# Patient Record
Sex: Female | Born: 1970 | Hispanic: Refuse to answer | Marital: Married | Smoking: Never smoker
Health system: Southern US, Community
[De-identification: ages and names within clinical notes are randomized; demographics above are authoritative.]

## PROBLEM LIST (undated history)

## (undated) DIAGNOSIS — Z8041 Family history of malignant neoplasm of ovary: Secondary | ICD-10-CM

## (undated) DIAGNOSIS — C50919 Malignant neoplasm of unspecified site of unspecified female breast: Secondary | ICD-10-CM

## (undated) DIAGNOSIS — Z8049 Family history of malignant neoplasm of other genital organs: Secondary | ICD-10-CM

## (undated) DIAGNOSIS — M722 Plantar fascial fibromatosis: Secondary | ICD-10-CM

## (undated) DIAGNOSIS — I773 Arterial fibromuscular dysplasia: Secondary | ICD-10-CM

## (undated) DIAGNOSIS — Z8042 Family history of malignant neoplasm of prostate: Secondary | ICD-10-CM

## (undated) HISTORY — DX: Family history of malignant neoplasm of other genital organs: Z80.49

## (undated) HISTORY — DX: Family history of malignant neoplasm of ovary: Z80.41

## (undated) HISTORY — DX: Family history of malignant neoplasm of prostate: Z80.42

## (undated) HISTORY — DX: Malignant neoplasm of unspecified site of unspecified female breast: C50.919

## (undated) HISTORY — DX: Arterial fibromuscular dysplasia: I77.3

## (undated) HISTORY — DX: Plantar fascial fibromatosis: M72.2

---

## 1998-08-15 ENCOUNTER — Other Ambulatory Visit: Admission: RE | Admit: 1998-08-15 | Discharge: 1998-08-15 | Payer: Self-pay | Admitting: *Deleted

## 2003-11-27 ENCOUNTER — Other Ambulatory Visit: Admission: RE | Admit: 2003-11-27 | Discharge: 2003-11-27 | Payer: Self-pay | Admitting: Obstetrics and Gynecology

## 2004-11-09 ENCOUNTER — Emergency Department (HOSPITAL_COMMUNITY): Admission: EM | Admit: 2004-11-09 | Discharge: 2004-11-10 | Payer: Self-pay | Admitting: Emergency Medicine

## 2005-12-18 ENCOUNTER — Other Ambulatory Visit: Admission: RE | Admit: 2005-12-18 | Discharge: 2005-12-18 | Payer: Self-pay | Admitting: Obstetrics and Gynecology

## 2011-10-15 ENCOUNTER — Other Ambulatory Visit (HOSPITAL_COMMUNITY)
Admission: RE | Admit: 2011-10-15 | Discharge: 2011-10-15 | Disposition: A | Discharge status: Home / Self Care | Payer: BC Managed Care – PPO | Source: Ambulatory Visit | Attending: Internal Medicine | Admitting: Internal Medicine

## 2011-10-15 DIAGNOSIS — Z01419 Encounter for gynecological examination (general) (routine) without abnormal findings: Secondary | ICD-10-CM | POA: Insufficient documentation

## 2011-10-15 DIAGNOSIS — Z1159 Encounter for screening for other viral diseases: Secondary | ICD-10-CM | POA: Insufficient documentation

## 2012-06-14 DIAGNOSIS — Z0189 Encounter for other specified special examinations: Secondary | ICD-10-CM | POA: Insufficient documentation

## 2012-06-15 ENCOUNTER — Emergency Department (HOSPITAL_COMMUNITY)
Admission: EM | Admit: 2012-06-15 | Discharge: 2012-06-15 | Disposition: A | Discharge status: Home / Self Care | Payer: BC Managed Care – PPO | Attending: Emergency Medicine | Admitting: Emergency Medicine

## 2012-06-15 ENCOUNTER — Encounter (HOSPITAL_COMMUNITY): Payer: Self-pay | Admitting: *Deleted

## 2012-06-15 DIAGNOSIS — Z77098 Contact with and (suspected) exposure to other hazardous, chiefly nonmedicinal, chemicals: Secondary | ICD-10-CM
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
## 2013-08-08 ENCOUNTER — Encounter: Payer: Self-pay | Admitting: Internal Medicine

## 2013-08-09 ENCOUNTER — Encounter: Payer: Self-pay | Admitting: Physician Assistant

## 2013-08-09 ENCOUNTER — Ambulatory Visit: Payer: BC Managed Care – PPO | Admitting: Physician Assistant

## 2013-08-09 VITALS — BP 100/58 | HR 80 | Temp 98.8°F | Resp 16 | Wt 118.0 lb

## 2013-08-09 DIAGNOSIS — R197 Diarrhea, unspecified: Secondary | ICD-10-CM

## 2013-08-09 DIAGNOSIS — N3000 Acute cystitis without hematuria: Secondary | ICD-10-CM

## 2013-08-09 DIAGNOSIS — M26609 Unspecified temporomandibular joint disorder, unspecified side: Secondary | ICD-10-CM

## 2013-08-09 NOTE — Patient Instructions (Signed)
What is the TMJ? The temporomandibular (tem-PUH-ro-man-DIB-yoo-ler) joint, or the TMJ, connects the upper and lower jawbones. This joint allows the jaw to open wide and move back and forth when you chew, talk, or yawn.There are also several muscles that help this joint move. There can be muscle tightness and pain in the muscle that can cause several symptoms.  What causes TMJ pain? There are many causes of TMJ pain. Repeated chewing (for example, chewing gum) and clenching your teeth can cause pain in the joint. Some TMJ pain has no obvious cause. What can I do to ease the pain? There are many things you can do to help your pain get better. When you have pain:  Eat soft foods and stay away from chewy foods (for example, taffy) Try to use both sides of your mouth to chew Don't chew gum Don't open your mouth wide (for example, during yawning or singing) Don't bite your cheeks or fingernails Lower your amount of stress and worry Applying a warm, damp washcloth to the joint may help. Over-the-counter pain medicines such as ibuprofen (one brand: Advil) or acetaminophen (one brand: Tylenol) might also help. Do not use these medicines if you are allergic to them or if your doctor told you not to use them. How can I stop the pain from coming back? When your pain is better, you can do these exercises to make your muscles stronger and to keep the pain from coming back:  Resisted mouth opening: Place your thumb or two fingers under your chin and open your mouth slowly, pushing up lightly on your chin with your thumb. Hold for three to six seconds. Close your mouth slowly. Resisted mouth closing: Place your thumbs under your chin and your two index fingers on the ridge between your mouth and the bottom of your chin. Push down lightly on your chin as you close your mouth. Tongue up: Slowly open and close your mouth while keeping the tongue touching the roof of the mouth. Side-to-side jaw movement: Place an  object about one fourth of an inch thick (for example, two tongue depressors) between your front teeth. Slowly move your jaw from side to side. Increase the thickness of the object as the exercise becomes easier Forward jaw movement: Place an object about one fourth of an inch thick between your front teeth and move the bottom jaw forward so that the bottom teeth are in front of the top teeth. Increase the thickness of the object as the exercise becomes easier. These exercises should not be painful. If it hurts to do these exercises, stop doing them and talk to your family doctor.  Diet for Diarrhea, Adult Frequent, runny stools (diarrhea) may be caused or worsened by food or drink. Diarrhea may be relieved by changing your diet. Since diarrhea can last up to 7 days, it is easy for you to lose too much fluid from the body and become dehydrated. Fluids that are lost need to be replaced. Along with a modified diet, make sure you drink enough fluids to keep your urine clear or pale yellow. DIET INSTRUCTIONS  Ensure adequate fluid intake (hydration): have 1 cup (8 oz) of fluid for each diarrhea episode. Avoid fluids that contain simple sugars or sports drinks, fruit juices, whole milk products, and sodas. Your urine should be clear or pale yellow if you are drinking enough fluids. Hydrate with an oral rehydration solution that you can purchase at pharmacies, retail stores, and online. You can prepare an oral rehydration  solution at home by mixing the following ingredients together:    tsp table salt.   tsp baking soda.   tsp salt substitute containing potassium chloride.  1  tablespoons sugar.  1 L (34 oz) of water.  Certain foods and beverages may increase the speed at which food moves through the gastrointestinal (GI) tract. These foods and beverages should be avoided and include:  Caffeinated and alcoholic beverages.  High-fiber foods, such as raw fruits and vegetables, nuts, seeds, and whole  grain breads and cereals.  Foods and beverages sweetened with sugar alcohols, such as xylitol, sorbitol, and mannitol.  Some foods may be well tolerated and may help thicken stool including:  Starchy foods, such as rice, toast, pasta, low-sugar cereal, oatmeal, grits, baked potatoes, crackers, and bagels.   Bananas.   Applesauce.  Add probiotic-rich foods to help increase healthy bacteria in the GI tract, such as yogurt and fermented milk products. RECOMMENDED FOODS AND BEVERAGES Starches Choose foods with less than 2 g of fiber per serving.  Recommended:  White, [place], and pita breads, plain rolls, buns, bagels. Plain muffins, matzo. Soda, saltine, or graham crackers. Pretzels, melba toast, zwieback. Cooked cereals made with water: cornmeal, farina, cream cereals. Dry cereals: refined corn, wheat, rice. Potatoes prepared any way without skins, refined macaroni, spaghetti, noodles, refined rice.  Avoid:  Bread, rolls, or crackers made with whole wheat, multi-grains, rye, bran seeds, nuts, or coconut. Corn tortillas or taco shells. Cereals containing whole grains, multi-grains, bran, coconut, nuts, raisins. Cooked or dry oatmeal. Coarse wheat cereals, granola. Cereals advertised as "high-fiber." Potato skins. Whole grain pasta, wild or brown rice. Popcorn. Sweet potatoes, yams. Sweet rolls, doughnuts, waffles, pancakes, sweet breads. Vegetables  Recommended: Strained tomato and vegetable juices. Most well-cooked and canned vegetables without seeds. Fresh: Tender lettuce, cucumber without the skin, cabbage, spinach, bean sprouts.  Avoid: Fresh, cooked, or canned: Artichokes, baked beans, beet greens, broccoli, Brussels sprouts, corn, kale, legumes, peas, sweet potatoes. Cooked: Green or red cabbage, spinach. Avoid large servings of any vegetables because vegetables shrink when cooked, and they contain more fiber per serving than fresh vegetables. Fruit  Recommended: Cooked or canned:  Apricots, applesauce, cantaloupe, cherries, fruit cocktail, grapefruit, grapes, kiwi, mandarin oranges, peaches, pears, plums, watermelon. Fresh: Apples without skin, ripe banana, grapes, cantaloupe, cherries, grapefruit, peaches, oranges, plums. Keep servings limited to  cup or 1 piece.  Avoid: Fresh: Apples with skin, apricots, mangoes, pears, raspberries, strawberries. Prune juice, stewed or dried prunes. Dried fruits, raisins, dates. Large servings of all fresh fruits. Protein  Recommended: Ground or well-cooked tender beef, ham, veal, lamb, pork, or poultry. Eggs. Fish, oysters, shrimp, lobster, other seafoods. Liver, organ meats.  Avoid: Tough, fibrous meats with gristle. Peanut butter, smooth or chunky. Cheese, nuts, seeds, legumes, dried peas, beans, lentils. Dairy  Recommended: Yogurt, lactose-free milk, kefir, drinkable yogurt, buttermilk, soy milk, or plain hard cheese.  Avoid: Milk, chocolate milk, beverages made with milk, such as milkshakes. Soups  Recommended: Bouillon, broth, or soups made from allowed foods. Any strained soup.  Avoid: Soups made from vegetables that are not allowed, cream or milk-based soups. Desserts and Sweets  Recommended: Sugar-free gelatin, sugar-free frozen ice pops made without sugar alcohol.  Avoid: Plain cakes and cookies, pie made with fruit, pudding, custard, cream pie. Gelatin, fruit, ice, sherbet, frozen ice pops. Ice cream, ice milk without nuts. Plain hard candy, honey, jelly, molasses, syrup, sugar, chocolate syrup, gumdrops, marshmallows. Fats and Oils  Recommended: Limit fats to less than 8  tsp per day.  Avoid: Seeds, nuts, olives, avocados. Margarine, butter, cream, mayonnaise, salad oils, plain salad dressings. Plain gravy, crisp bacon without rind. Beverages  Recommended: Water, decaffeinated teas, oral rehydration solutions, sugar-free beverages not sweetened with sugar alcohols.  Avoid: Fruit juices, caffeinated beverages  (coffee, tea, soda), alcohol, sports drinks, or lemon-lime soda. Condiments  Recommended: Ketchup, mustard, horseradish, vinegar, cocoa powder. Spices in moderation: allspice, basil, bay leaves, celery powder or leaves, cinnamon, cumin powder, curry powder, ginger, mace, marjoram, onion or garlic powder, oregano, paprika, parsley flakes, ground pepper, rosemary, sage, savory, tarragon, thyme, turmeric.  Avoid: Coconut, honey. Document Released: 12/05/2003 Document Revised: 06/08/2012 Document Reviewed: 01/29/2012 [facility] Patient Information 2014 [place], [place].

## 2013-08-09 NOTE — Progress Notes (Signed)
Subjective:     Patient ID: Hannah Woodard, female   DOB: 03-Jul-1971, 42 y.o.   MRN: [id number]  Diarrhea  This is a new problem. The current episode started 1 to 4 weeks ago. The problem occurs less than 2 times per day. The problem has been unchanged. The patient states that diarrhea does not awaken her from sleep. Associated symptoms include abdominal pain (right lower flank/quadrant), coughing, headaches and a URI. Pertinent negatives include no chills (jaw pain), fever or vomiting. The symptoms are aggravated by diary products. There are no known risk factors. She has tried bismuth subsalicylate for the symptoms. The treatment provided no relief.  Rectal Bleeding  The current episode started yesterday. The problem occurs rarely. The problem has been unchanged. The stool is described as soft. Associated symptoms include abdominal pain (right lower flank/quadrant), diarrhea, nausea, headaches and coughing. Pertinent negatives include no fever, no rectal pain, no vomiting and no chest pain.  Cough This is a new problem. The current episode started yesterday. The problem has been unchanged. The cough is non-productive. Associated symptoms include ear pain and headaches. Pertinent negatives include no chest pain, chills (jaw pain), ear congestion, fever, postnasal drip, rhinorrhea, shortness of breath or wheezing. Nothing aggravates the symptoms. She has tried nothing for the symptoms.  Patient also mentions possible UTI symptoms of dysuria and right flank/back pain.    Medication List       This list is accurate as of: 08/09/13  4:44 PM.  Always use your most recent med list.               b complex vitamins tablet  Take 1 tablet by mouth daily.     fish oil-omega-3 fatty acids 1000 MG capsule  Take 1 g by mouth daily.     Green Tea 250 MG Caps  Take by mouth 2 (two) times daily.     PROBIOTIC DAILY PO  Take 1 capsule by mouth daily.     vitamin C 500 MG tablet  Commonly known as:   ASCORBIC ACID  Take 1,000 mg by mouth daily.     Vitamin D 2000 UNITS tablet  Take 10,000 Units by mouth daily.     vitamin E 400 UNIT capsule  Take 400 Units by mouth daily.      =  Review of Systems  Constitutional: Negative.  Negative for fever and chills (jaw pain).  HENT: Positive for ear pain. Negative for congestion, dental problem, ear discharge, facial swelling, hearing loss, mouth sores, nosebleeds, postnasal drip, rhinorrhea and sinus pressure.   Respiratory: Positive for cough and choking. Negative for chest tightness, shortness of breath, wheezing and stridor.   Cardiovascular: Negative.  Negative for chest pain.  Gastrointestinal: Positive for nausea, abdominal pain (right lower flank/quadrant), diarrhea and hematochezia. Negative for vomiting, constipation, abdominal distention, anal bleeding and rectal pain. Blood in stool: Black stool X 1-2 times.  Genitourinary: Positive for dysuria and flank pain.  Musculoskeletal: Negative.   Neurological: Positive for headaches.  Psychiatric/Behavioral: Negative.        Objective:   Physical Exam  Constitutional: She appears well-developed and well-nourished.  HENT:  Head: Normocephalic and atraumatic.  Right Ear: External ear normal.  Left Ear: External ear normal.  + Bilateral TMJ tenderness  Abdominal: Soft. Bowel sounds are normal. There is tenderness in the right lower quadrant and suprapubic area. There is no rebound.       Assessment and Plan:     TMJ pain-  Heat, massage, soft foods.  Diarrhea with questionable melana- get labs, send hemoccult card. Test celiac, test for ESR, CBC, BMP, magnesium.   Questionable UTI- check UA and C&S.      Follow up PRN.

## 2013-08-10 ENCOUNTER — Encounter: Payer: Self-pay | Admitting: Physician Assistant

## 2013-08-10 LAB — CBC WITH DIFFERENTIAL/PLATELET
Basophils Absolute: 0 10*3/uL (ref 0.0–0.1)
Basophils Relative: 1 % (ref 0–1)
HCT: 40.3 % (ref 36.0–46.0)
Lymphocytes Relative: 23 % (ref 12–46)
Lymphs Abs: 1.4 10*3/uL (ref 0.7–4.0)
MCH: 33.4 pg (ref 26.0–34.0)
Monocytes Absolute: 0.5 10*3/uL (ref 0.1–1.0)
Monocytes Relative: 8 % (ref 3–12)

## 2013-08-10 LAB — URINALYSIS, ROUTINE W REFLEX MICROSCOPIC
Glucose, UA: NEGATIVE mg/dL
Ketones, ur: NEGATIVE mg/dL
Leukocytes, UA: NEGATIVE
Nitrite: NEGATIVE
Urobilinogen, UA: 0.2 mg/dL (ref 0.0–1.0)

## 2013-08-10 LAB — SEDIMENTATION RATE: Sed Rate: 1 mm/hr (ref 0–22)

## 2013-08-10 LAB — HEPATIC FUNCTION PANEL
AST: 20 U/L (ref 0–37)
Albumin: 4.8 g/dL (ref 3.5–5.2)
Total Bilirubin: 0.8 mg/dL (ref 0.3–1.2)
Total Protein: 7.3 g/dL (ref 6.0–8.3)

## 2013-08-10 LAB — BASIC METABOLIC PANEL WITH GFR
CO2: 29 mEq/L (ref 19–32)
Glucose, Bld: 88 mg/dL (ref 70–99)
Potassium: 3.9 mEq/L (ref 3.5–5.3)

## 2013-08-10 LAB — MAGNESIUM: Magnesium: 2.3 mg/dL (ref 1.5–2.5)

## 2013-08-11 LAB — CELIAC PANEL 10
Endomysial Screen: NEGATIVE
Gliadin IgA: 5.3 U/mL (ref <20)
IgA: 100 mg/dL (ref 69–380)

## 2013-10-03 ENCOUNTER — Other Ambulatory Visit: Payer: Self-pay | Admitting: Internal Medicine

## 2013-10-03 DIAGNOSIS — R197 Diarrhea, unspecified: Secondary | ICD-10-CM

## 2013-10-03 LAB — POC HEMOCCULT BLD/STL (HOME/3-CARD/SCREEN)
FECAL OCCULT BLD: NEGATIVE
FECAL OCCULT BLD: NEGATIVE
Fecal Occult Blood, POC: NEGATIVE

## 2013-11-22 ENCOUNTER — Encounter: Payer: Self-pay | Admitting: Physician Assistant

## 2013-12-01 ENCOUNTER — Encounter: Payer: Self-pay | Admitting: Physician Assistant

## 2013-12-04 ENCOUNTER — Ambulatory Visit (INDEPENDENT_AMBULATORY_CARE_PROVIDER_SITE_OTHER): Payer: BC Managed Care – PPO | Admitting: Physician Assistant

## 2013-12-04 ENCOUNTER — Encounter: Payer: Self-pay | Admitting: Physician Assistant

## 2013-12-04 VITALS — BP 100/60 | HR 80 | Temp 97.9°F | Resp 16 | Ht 65.5 in | Wt 119.0 lb

## 2013-12-04 DIAGNOSIS — E559 Vitamin D deficiency, unspecified: Secondary | ICD-10-CM

## 2013-12-04 DIAGNOSIS — Z79899 Other long term (current) drug therapy: Secondary | ICD-10-CM

## 2013-12-04 DIAGNOSIS — Z0000 Encounter for general adult medical examination without abnormal findings: Secondary | ICD-10-CM

## 2013-12-04 DIAGNOSIS — N3000 Acute cystitis without hematuria: Secondary | ICD-10-CM

## 2013-12-04 LAB — CBC WITH DIFFERENTIAL/PLATELET
BASOS PCT: 0 % (ref 0–1)
Basophils Absolute: 0 10*3/uL (ref 0.0–0.1)
EOS ABS: 0.1 10*3/uL (ref 0.0–0.7)
Eosinophils Relative: 1 % (ref 0–5)
HCT: 38.6 % (ref 36.0–46.0)
HEMOGLOBIN: 13.1 g/dL (ref 12.0–15.0)
LYMPHS PCT: 23 % (ref 12–46)
Lymphs Abs: 1.3 10*3/uL (ref 0.7–4.0)
MCH: 33 pg (ref 26.0–34.0)
MCHC: 33.9 g/dL (ref 30.0–36.0)
MCV: 97.2 fL (ref 78.0–100.0)
Monocytes Absolute: 0.4 10*3/uL (ref 0.1–1.0)
Monocytes Relative: 7 % (ref 3–12)
Neutro Abs: 3.8 10*3/uL (ref 1.7–7.7)
Neutrophils Relative %: 69 % (ref 43–77)
PLATELETS: 251 10*3/uL (ref 150–400)
RBC: 3.97 MIL/uL (ref 3.87–5.11)
RDW: 13.1 % (ref 11.5–15.5)
WBC: 5.5 10*3/uL (ref 4.0–10.5)

## 2013-12-04 LAB — HEPATIC FUNCTION PANEL
ALT: 12 U/L (ref 0–35)
AST: 17 U/L (ref 0–37)
Albumin: 4.6 g/dL (ref 3.5–5.2)
Alkaline Phosphatase: 41 U/L (ref 39–117)
Bilirubin, Direct: 0.2 mg/dL (ref 0.0–0.3)
Indirect Bilirubin: 0.6 mg/dL (ref 0.2–1.2)
Total Bilirubin: 0.8 mg/dL (ref 0.2–1.2)
Total Protein: 6.7 g/dL (ref 6.0–8.3)

## 2013-12-04 LAB — LIPID PANEL
CHOL/HDL RATIO: 2.2 ratio
Cholesterol: 123 mg/dL (ref 0–200)
HDL: 55 mg/dL (ref >39)
LDL CALC: 60 mg/dL (ref 0–99)
TRIGLYCERIDES: 38 mg/dL (ref <150)
VLDL: 8 mg/dL (ref 0–40)

## 2013-12-04 LAB — BASIC METABOLIC PANEL WITH GFR
BUN: 10 mg/dL (ref 6–23)
CALCIUM: 9.1 mg/dL (ref 8.4–10.5)
CO2: 29 mEq/L (ref 19–32)
CREATININE: 0.48 mg/dL — AB (ref 0.50–1.10)
Chloride: 100 mEq/L (ref 96–112)
Glucose, Bld: 88 mg/dL (ref 70–99)
Potassium: 4.5 mEq/L (ref 3.5–5.3)
Sodium: 138 mEq/L (ref 135–145)

## 2013-12-04 LAB — IRON AND TIBC
%SAT: 39 % (ref 20–55)
Iron: 128 ug/dL (ref 42–145)
TIBC: 332 ug/dL (ref 250–470)
UIBC: 204 ug/dL (ref 125–400)

## 2013-12-04 LAB — HEMOGLOBIN A1C
HEMOGLOBIN A1C: 5.1 % (ref <5.7)
Mean Plasma Glucose: 100 mg/dL (ref <117)

## 2013-12-04 LAB — MAGNESIUM: MAGNESIUM: 2 mg/dL (ref 1.5–2.5)

## 2013-12-04 NOTE — Patient Instructions (Addendum)
Try to get alegria shoes for standing.   What is the TMJ? The temporomandibular (tem-PUH-ro-man-DIB-yoo-ler) joint, or the TMJ, connects the upper and lower jawbones. This joint allows the jaw to open wide and move back and forth when you chew, talk, or yawn.There are also several muscles that help this joint move. There can be muscle tightness and pain in the muscle that can cause several symptoms.  What causes TMJ pain? There are many causes of TMJ pain. Repeated chewing (for example, chewing gum) and clenching your teeth can cause pain in the joint. Some TMJ pain has no obvious cause. What can I do to ease the pain? There are many things you can do to help your pain get better. When you have pain:  Eat soft foods and stay away from chewy foods (for example, taffy) Try to use both sides of your mouth to chew Don't chew gum Don't open your mouth wide (for example, during yawning or singing) Don't bite your cheeks or fingernails Lower your amount of stress and worry Applying a warm, damp washcloth to the joint may help. Over-the-counter pain medicines such as ibuprofen (one brand: Advil) or acetaminophen (one brand: Tylenol) might also help. Do not use these medicines if you are allergic to them or if your doctor told you not to use them. How can I stop the pain from coming back? When your pain is better, you can do these exercises to make your muscles stronger and to keep the pain from coming back:  Resisted mouth opening: Place your thumb or two fingers under your chin and open your mouth slowly, pushing up lightly on your chin with your thumb. Hold for three to six seconds. Close your mouth slowly. Resisted mouth closing: Place your thumbs under your chin and your two index fingers on the ridge between your mouth and the bottom of your chin. Push down lightly on your chin as you close your mouth. Tongue up: Slowly open and close your mouth while keeping the tongue touching the roof of the  mouth. Side-to-side jaw movement: Place an object about one fourth of an inch thick (for example, two tongue depressors) between your front teeth. Slowly move your jaw from side to side. Increase the thickness of the object as the exercise becomes easier Forward jaw movement: Place an object about one fourth of an inch thick between your front teeth and move the bottom jaw forward so that the bottom teeth are in front of the top teeth. Increase the thickness of the object as the exercise becomes easier. These exercises should not be painful. If it hurts to do these exercises, stop doing them and talk to your family doctor.

## 2013-12-04 NOTE — Progress Notes (Signed)
Complete Physical HPI 43 y.o. female  presents for a complete physical. Her blood pressure has been controlled at home, today their BP is BP: 100/60 mmHg Patient is on Vitamin D supplement.   She continues to have some mild diarrhea but has linked it to fried foods, coconut oil, vitamin C For the last two days she has had urgency, frequency, some discomfort.  LMP- 11/24/23 Right foot cramping for past 1-2 months at the end of the day,   Current Medications:  Current Outpatient Prescriptions on File Prior to Visit  Medication Sig Dispense Refill  . b complex vitamins tablet Take 1 tablet by mouth daily.      . Cholecalciferol (VITAMIN D) 2000 UNITS tablet Take 10,000 Units by mouth daily.       . fish oil-omega-3 fatty acids 1000 MG capsule Take 1 g by mouth daily.      Nyoka Cowden Tea 250 MG CAPS Take by mouth 2 (two) times daily.      . Probiotic Product (PROBIOTIC DAILY PO) Take 1 capsule by mouth daily.      . vitamin C (ASCORBIC ACID) 500 MG tablet Take 1,000 mg by mouth daily.      . vitamin E 400 UNIT capsule Take 400 Units by mouth daily.       No current facility-administered medications on file prior to visit.   Health Maintenance:   There is no immunization history on file for this patient.  Tetanus: 2010 Pneumovax: N/A Flu vaccine: N/A Zostavax: N/A Pap:2013 Normal never had abnormal pap MGM: 12/2012 with neg biopsy DEXA: N/A Colonoscopy: N/A EGD: N/A  Allergies:  Allergies  Allergen Reactions  . Poison Ivy Extract [employer] Poison Ivy]    Medical History: No past medical history on file. Surgical History: No past surgical history on file. Family History:  Family History  Problem Relation Age of Onset  . Cancer Mother     ovarian  . Cancer Father     prosate   Social History:  History  Substance Use Topics  . Smoking status: Never Smoker   . Smokeless tobacco: Not on file  . Alcohol Use: No    Review of Systems: [X]  = complains of  [ ]  =  denies  General: Fatigue [ ]  Fever [ ]  Chills [ ]  Weakness [ ]   Insomnia [ ]  Weight change [ ]  Night sweats [ ]   Change in appetite [ ]  Eyes: Glasses DEE 2010 Redness [ ]  Blurred vision [ ]  Diplopia [ ]  Discharge [ ]   ENT: Congestion [ ]  Sinus Pain [ ]  Post Nasal Drip [ ]  Sore Throat [ ]  Earache [ ]  hearing loss [ ]  Tinnitus [ ]  Snoring [ ]   Cardiac: Chest pain/pressure [ ]  SOB [ ]  Orthopnea [ ]   Palpitations [ ]   Paroxysmal nocturnal dyspnea[ ]  Claudication [ ]  Edema [ ]   Pulmonary: Cough [ ]  Wheezing[ ]   SOB [ ]   Pleurisy [ ]   GI: Nausea [ ]  Vomiting[ ]  Dysphagia[ ]  Heartburn[X ] Abdominal pain [ ]  Constipation Valu.Nieves ]; Diarrhea Valu.Nieves ] BRBPR [ ]  Melena[ ]  Bloating [ ]  Hemorrhoids [ ]   GU: Hematuria[ ]  Dysuria [ ]  Nocturia[ ]  Urgency [ ]   Hesitancy [ ]  Discharge [ ]  Frequency [ ]   Breast:  Breast lumps [ ]   nipple discharge [ ]    Neuro: Headaches[ ]  Vertigo[ ]  Paresthesias[ ]  Spasm [ ]  Speech changes [ ]  Incoordination [ ]   Ortho: Arthritis [ ]  Joint  pain [ ]  Muscle pain [ ]  Joint swelling [ ]  Back Pain [ ]  Skin:  Rash [ ]   Pruritis [ ]  Change in skin lesion [ ]   Psych: Depression[ ]  Anxiety[ ]  Confusion [ ]  Memory loss [ ]   Heme/Lypmh: Bleeding [ ]  Bruising [ ]  Enlarged lymph nodes [ ]   Endocrine: Visual blurring [ ]  Paresthesia [ ]  Polyuria [ ]  Polydypsea [ ]    Heat/cold intolerance [ ]  Hypoglycemia [ ]   Physical Exam: Estimated body mass index is 19.49 kg/(m^2) as calculated from the following:   Height as of this encounter: 5' 5.5" (1.664 m).   Weight as of this encounter: 119 lb (53.978 kg). Filed Vitals:   12/04/13 1407  BP: 100/60  Pulse: 80  Temp: 97.9 F (36.6 C)  Resp: 16   General Appearance: Well nourished, in no apparent distress. Eyes: PERRLA, EOMs, conjunctiva no swelling or erythema, normal fundi and vessels. Sinuses: No Frontal/maxillary tenderness ENT/Mouth: Ext aud canals clear, normal light reflex with TMs without erythema, bulging.  Good dentition. No erythema,  swelling, or exudate on post pharynx. Tonsils not swollen or erythematous. Hearing normal.  Neck: Supple, thyroid normal. No bruits Respiratory: Respiratory effort normal, BS equal bilaterally without rales, rhonchi, wheezing or stridor. Cardio: RRR without murmurs, rubs or gallops. Brisk peripheral pulses without edema.  Chest: symmetric, with normal excursions and percussion. Breasts: Symmetric, without lumps, nipple discharge, retractions. Abdomen: Soft, +BS. Non tender, no guarding, rebound, hernias, masses, or organomegaly. .  Lymphatics: Non tender without lymphadenopathy.  Genitourinary: defer Musculoskeletal: Full ROM all peripheral extremities,5/5 strength, and normal gait. Skin: Warm, dry without rashes, lesions, ecchymosis.  Neuro: Cranial nerves intact, reflexes equal bilaterally. Normal muscle tone, no cerebellar symptoms. Sensation intact.  Psych: Awake and oriented X 3, normal affect, Insight and Judgment appropriate.   EKG: not needed  Assessment and Plan: Diarrhea- continue to monitor food intake/keep food diary.  Urgency/frequency- rule out UTI, versus ICS.  TMJ-information given to the patient, no gum/decrease hard foods, warm wet wash clothes, decrease stress, talk with dentist about possible night guard, can do massage, and exercise.  Get [facility] Health Maintenance  Discussed med's effects and SE's. Screening labs and tests as requested with regular follow-up as recommended.   Vicie Mutters 2:18 PM

## 2013-12-05 LAB — MICROALBUMIN / CREATININE URINE RATIO
CREATININE, URINE: 25.5 mg/dL
MICROALB UR: 0.54 mg/dL (ref 0.00–1.89)
MICROALB/CREAT RATIO: 21.2 mg/g (ref 0.0–30.0)

## 2013-12-05 LAB — URINALYSIS, ROUTINE W REFLEX MICROSCOPIC
Bilirubin Urine: NEGATIVE
Glucose, UA: NEGATIVE mg/dL
HGB URINE DIPSTICK: NEGATIVE
Ketones, ur: NEGATIVE mg/dL
LEUKOCYTES UA: NEGATIVE
Nitrite: NEGATIVE
PROTEIN: NEGATIVE mg/dL
Specific Gravity, Urine: <1.005 — ABNORMAL LOW (ref 1.005–1.030)
UROBILINOGEN UA: 0.2 mg/dL (ref 0.0–1.0)
pH: 6.5 (ref 5.0–8.0)

## 2013-12-05 LAB — INSULIN, FASTING: Insulin fasting, serum: 8 u[IU]/mL (ref 3–28)

## 2013-12-05 LAB — VITAMIN D 25 HYDROXY (VIT D DEFICIENCY, FRACTURES): Vit D, 25-Hydroxy: 71 ng/mL (ref 30–89)

## 2013-12-05 LAB — URINE CULTURE
COLONY COUNT: NO GROWTH
ORGANISM ID, BACTERIA: NO GROWTH

## 2013-12-05 LAB — VITAMIN B12: Vitamin B-12: 593 pg/mL (ref 211–911)

## 2013-12-05 LAB — FERRITIN: Ferritin: 27 ng/mL (ref 10–291)

## 2013-12-05 LAB — FOLATE RBC: RBC Folate: 642 ng/mL (ref >280)

## 2013-12-05 LAB — TSH: TSH: 2.136 u[IU]/mL (ref 0.350–4.500)

## 2014-02-12 ENCOUNTER — Encounter: Payer: Self-pay | Admitting: Physician Assistant

## 2014-07-10 ENCOUNTER — Emergency Department (HOSPITAL_COMMUNITY)
Admission: EM | Admit: 2014-07-10 | Discharge: 2014-07-10 | Disposition: A | Discharge status: Home / Self Care | Payer: BC Managed Care – PPO | Attending: Emergency Medicine | Admitting: Emergency Medicine

## 2014-07-10 ENCOUNTER — Encounter (HOSPITAL_COMMUNITY): Payer: Self-pay | Admitting: Emergency Medicine

## 2014-07-10 DIAGNOSIS — R197 Diarrhea, unspecified: Secondary | ICD-10-CM | POA: Diagnosis not present

## 2014-07-10 DIAGNOSIS — R5383 Other fatigue: Secondary | ICD-10-CM | POA: Diagnosis not present

## 2014-07-10 DIAGNOSIS — R1110 Vomiting, unspecified: Secondary | ICD-10-CM | POA: Diagnosis not present

## 2014-07-10 DIAGNOSIS — Z79899 Other long term (current) drug therapy: Secondary | ICD-10-CM | POA: Diagnosis not present

## 2014-07-10 LAB — COMPREHENSIVE METABOLIC PANEL
ALT: 14 U/L (ref 0–35)
ANION GAP: 13 (ref 5–15)
AST: 23 U/L (ref 0–37)
Albumin: 4.3 g/dL (ref 3.5–5.2)
Alkaline Phosphatase: 46 U/L (ref 39–117)
BUN: 9 mg/dL (ref 6–23)
CALCIUM: 9 mg/dL (ref 8.4–10.5)
CO2: 28 mEq/L (ref 19–32)
Chloride: 101 mEq/L (ref 96–112)
Creatinine, Ser: 0.46 mg/dL — ABNORMAL LOW (ref 0.50–1.10)
GFR calc Af Amer: >90 mL/min (ref >90)
GLUCOSE: 136 mg/dL — AB (ref 70–99)
POTASSIUM: 3.8 meq/L (ref 3.7–5.3)
Sodium: 142 mEq/L (ref 137–147)
TOTAL PROTEIN: 7 g/dL (ref 6.0–8.3)
Total Bilirubin: 0.7 mg/dL (ref 0.3–1.2)

## 2014-07-10 LAB — URINALYSIS, ROUTINE W REFLEX MICROSCOPIC
BILIRUBIN URINE: NEGATIVE
GLUCOSE, UA: NEGATIVE mg/dL
HGB URINE DIPSTICK: NEGATIVE
KETONES UR: 15 mg/dL — AB
Leukocytes, UA: NEGATIVE
Nitrite: NEGATIVE
PH: 7 (ref 5.0–8.0)
Protein, ur: NEGATIVE mg/dL
SPECIFIC GRAVITY, URINE: 1.011 (ref 1.005–1.030)
Urobilinogen, UA: 1 mg/dL (ref 0.0–1.0)

## 2014-07-10 LAB — CBC WITH DIFFERENTIAL/PLATELET
Basophils Absolute: 0 10*3/uL (ref 0.0–0.1)
Basophils Relative: 0 % (ref 0–1)
Eosinophils Absolute: 0 10*3/uL (ref 0.0–0.7)
Eosinophils Relative: 0 % (ref 0–5)
HEMATOCRIT: 37.1 % (ref 36.0–46.0)
Hemoglobin: 13 g/dL (ref 12.0–15.0)
LYMPHS PCT: 7 % — AB (ref 12–46)
Lymphs Abs: 0.7 10*3/uL (ref 0.7–4.0)
MCH: 34 pg (ref 26.0–34.0)
MCHC: 35 g/dL (ref 30.0–36.0)
MCV: 97.1 fL (ref 78.0–100.0)
MONO ABS: 0.5 10*3/uL (ref 0.1–1.0)
MONOS PCT: 5 % (ref 3–12)
NEUTROS ABS: 8.4 10*3/uL — AB (ref 1.7–7.7)
NEUTROS PCT: 88 % — AB (ref 43–77)
Platelets: 226 10*3/uL (ref 150–400)
RBC: 3.82 MIL/uL — ABNORMAL LOW (ref 3.87–5.11)
RDW: 12.9 % (ref 11.5–15.5)
WBC: 9.6 10*3/uL (ref 4.0–10.5)

## 2014-07-10 MED ORDER — SODIUM CHLORIDE 0.9 % IV BOLUS (SEPSIS)
1000.0000 mL | Freq: Once | INTRAVENOUS | Status: AC
  Administered 2014-07-10: 1000 mL via INTRAVENOUS

## 2014-07-10 MED ORDER — ONDANSETRON 8 MG PO TBDP: 8.0000 mg | ORAL_TABLET | Freq: Three times a day (TID) | ORAL | Status: DC | PRN

## 2014-07-10 MED ORDER — ONDANSETRON HCL 4 MG/2ML IJ SOLN
4.0000 mg | Freq: Once | INTRAMUSCULAR | Status: AC
  Administered 2014-07-10: 4 mg via INTRAVENOUS
  Filled 2014-07-10: qty 2

## 2014-07-10 NOTE — ED Provider Notes (Signed)
CSN: [id number]     Arrival date & time 07/10/14  1027 History   First MD Initiated Contact with Patient 07/10/14 1210     Chief Complaint  Patient presents with  . Emesis  . Diarrhea     (Consider location/radiation/quality/duration/timing/severity/associated sxs/prior Treatment) Patient is a 44 y.o. female presenting with vomiting and diarrhea. The history is provided by the patient.  Emesis Severity:  Moderate Associated symptoms: diarrhea   Associated symptoms: no abdominal pain and no headaches   Diarrhea Associated symptoms: vomiting   Associated symptoms: no abdominal pain and no headaches    patient has diarrhea that began earlier today. She has not really had nausea but has had multiple episodes of diarrhea. She thinks it could be some food poisoning. She states that she also feels dizzy. She states it felt as if the room is spinning and that she could pass out. No dominant. No fevers. No cough. She states her pastor has been throwing up. No real pain.  History reviewed. No pertinent past medical history. History reviewed. No pertinent past surgical history. Family History  Problem Relation Age of Onset  . Cancer Mother     ovarian  . Cancer Father     prosate  . Ulcerative colitis Sister    History  Substance Use Topics  . Smoking status: Never Smoker   . Smokeless tobacco: Not on file  . Alcohol Use: No   OB History   Grav Para Term Preterm Abortions TAB SAB Ect Mult Living                 Review of Systems  Constitutional: Positive for fatigue. Negative for activity change and appetite change.  Eyes: Negative for pain.  Respiratory: Negative for chest tightness and shortness of breath.   Cardiovascular: Negative for chest pain and leg swelling.  Gastrointestinal: Positive for vomiting and diarrhea. Negative for nausea and abdominal pain.  Genitourinary: Negative for flank pain.  Musculoskeletal: Negative for back pain and neck stiffness.  Skin: Negative  for rash.  Neurological: Negative for weakness, numbness and headaches.  Psychiatric/Behavioral: Negative for behavioral problems.      Allergies  Milk-related compounds and Poison ivy extract  Home Medications   Prior to Admission medications   Medication Sig Start Date End Date Taking? Authorizing Provider  b complex vitamins tablet Take 1 tablet by mouth daily.   Yes Historical Provider, MD  Cholecalciferol (VITAMIN D) 2000 UNITS tablet Take 10,000 Units by mouth daily.    Yes Historical Provider, MD  fish oil-omega-3 fatty acids 1000 MG capsule Take 1 g by mouth daily.   Yes Historical Provider, MD  Nyoka Cowden Tea 250 MG CAPS Take by mouth 2 (two) times daily.   Yes Historical Provider, MD  Probiotic Product (PROBIOTIC DAILY PO) Take 1 capsule by mouth daily.   Yes Historical Provider, MD  vitamin C (ASCORBIC ACID) 500 MG tablet Take 1,000 mg by mouth daily.   Yes Historical Provider, MD  vitamin E 400 UNIT capsule Take 400 Units by mouth daily.   Yes Historical Provider, MD  ondansetron (ZOFRAN-ODT) 8 MG disintegrating tablet Take 1 tablet (8 mg total) by mouth every 8 (eight) hours as needed for nausea or vomiting. 07/10/14   Jasper Riling. Afua Hoots, MD   BP 103/65  Pulse 85  Resp 14  SpO2 100%  LMP 07/06/2014 Physical Exam  Nursing note and vitals reviewed. Constitutional: She is oriented to person, place, and time. She appears well-developed and well-nourished.  HENT:  Head: Normocephalic and atraumatic.  Neck: Normal range of motion. Neck supple.  Cardiovascular: Normal rate, regular rhythm and normal heart sounds.   No murmur heard. Pulmonary/Chest: Effort normal and breath sounds normal. No respiratory distress. She has no wheezes. She has no rales.  Abdominal: Soft. She exhibits no distension. There is no tenderness. There is no rebound and no guarding.  Musculoskeletal: Normal range of motion.  Neurological: She is alert and oriented to person, place, and time. No cranial  nerve deficit.  Skin: Skin is warm and dry.  Psychiatric: She has a normal mood and affect. Her speech is normal.    ED Course  Procedures (including critical care time) Labs Review Labs Reviewed  CBC WITH DIFFERENTIAL - Abnormal; Notable for the following:    RBC 3.82 (*)    Neutrophils Relative % 88 (*)    Neutro Abs 8.4 (*)    Lymphocytes Relative 7 (*)    All other components within normal limits  COMPREHENSIVE METABOLIC PANEL - Abnormal; Notable for the following:    Glucose, Bld 136 (*)    Creatinine, Ser 0.46 (*)    All other components within normal limits  URINALYSIS, ROUTINE W REFLEX MICROSCOPIC - Abnormal; Notable for the following:    Ketones, ur 15 (*)    All other components within normal limits    Imaging Review No results found.   EKG Interpretation None      MDM   Final diagnoses:  Diarrhea    Patient with diarrhea. Lab work overall reassuring. Patient feels better after IV fluids and his tolerate orals. Will discharge home with antiemetics. Will followup as needed. No need for stool studies seen at this time    Jasper Riling. Alvino Chapel, MD 07/10/14 [phone]

## 2014-07-10 NOTE — Discharge Instructions (Signed)
Diarrhea Diarrhea is frequent loose and watery bowel movements. It can cause you to feel weak and dehydrated. Dehydration can cause you to become tired and thirsty, have a dry mouth, and have decreased urination that often is dark yellow. Diarrhea is a sign of another problem, most often an infection that will not last long. In most cases, diarrhea typically lasts 2-3 days. However, it can last longer if it is a sign of something more serious. It is important to treat your diarrhea as directed by your caregiver to lessen or prevent future episodes of diarrhea. CAUSES  Some common causes include:  Gastrointestinal infections caused by viruses, bacteria, or parasites.  Food poisoning or food allergies.  Certain medicines, such as antibiotics, chemotherapy, and laxatives.  Artificial sweeteners and fructose.  Digestive disorders. HOME CARE INSTRUCTIONS  Ensure adequate fluid intake (hydration): Have 1 cup (8 oz) of fluid for each diarrhea episode. Avoid fluids that contain simple sugars or sports drinks, fruit juices, whole milk products, and sodas. Your urine should be clear or pale yellow if you are drinking enough fluids. Hydrate with an oral rehydration solution that you can purchase at pharmacies, retail stores, and online. You can prepare an oral rehydration solution at home by mixing the following ingredients together:   - tsp table salt.   tsp baking soda.   tsp salt substitute containing potassium chloride.  1  tablespoons sugar.  1 L (34 oz) of water.  Certain foods and beverages may increase the speed at which food moves through the gastrointestinal (GI) tract. These foods and beverages should be avoided and include:  Caffeinated and alcoholic beverages.  High-fiber foods, such as raw fruits and vegetables, nuts, seeds, and whole grain breads and cereals.  Foods and beverages sweetened with sugar alcohols, such as xylitol, sorbitol, and mannitol.  Some foods may be well  tolerated and may help thicken stool including:  Starchy foods, such as rice, toast, pasta, low-sugar cereal, oatmeal, grits, baked potatoes, crackers, and bagels.  Bananas.  Applesauce.  Add probiotic-rich foods to help increase healthy bacteria in the GI tract, such as yogurt and fermented milk products.  Wash your hands well after each diarrhea episode.  Only take over-the-counter or prescription medicines as directed by your caregiver.  Take a warm bath to relieve any burning or pain from frequent diarrhea episodes. SEEK IMMEDIATE MEDICAL CARE IF:   You are unable to keep fluids down.  You have persistent vomiting.  You have blood in your stool, or your stools are black and tarry.  You do not urinate in 6-8 hours, or there is only a small amount of very dark urine.  You have abdominal pain that increases or localizes.  You have weakness, dizziness, confusion, or light-headedness.  You have a severe headache.  Your diarrhea gets worse or does not get better.  You have a fever or persistent symptoms for more than 2-3 days.  You have a fever and your symptoms suddenly get worse. MAKE SURE YOU:   Understand these instructions.  Will watch your condition.  Will get help right away if you are not doing well or get worse. Document Released: 09/04/2002 Document Revised: 01/29/2014 Document Reviewed: 05/22/2012 [facility] Patient Information 2015 [place], [place]. This information is not intended to replace advice given to you by your health care provider. Make sure you discuss any questions you have with your health care provider.

## 2014-07-10 NOTE — ED Notes (Signed)
Pt c/o emesis, diarrhea, and dizziness starting this morning.  Denies pain.  Pt is concerned because she "drank a slippy pouch from the reduced bin at [employer]."

## 2014-07-10 NOTE — ED Notes (Signed)
Patient attempted to use restroom. Unable to urinate. Aware urine sample is still needed.

## 2014-08-27 ENCOUNTER — Encounter: Payer: Self-pay | Admitting: Physician Assistant

## 2014-08-30 ENCOUNTER — Encounter: Payer: Self-pay | Admitting: Physician Assistant

## 2014-08-30 MED ORDER — AZITHROMYCIN 250 MG PO TABS: ORAL_TABLET | ORAL | Status: DC

## 2014-08-30 MED ORDER — PREDNISONE 20 MG PO TABS: ORAL_TABLET | ORAL | Status: DC

## 2014-08-30 MED ORDER — BENZONATATE 100 MG PO CAPS: 100.0000 mg | ORAL_CAPSULE | Freq: Four times a day (QID) | ORAL | Status: DC | PRN

## 2014-12-06 ENCOUNTER — Encounter: Payer: Self-pay | Admitting: Physician Assistant

## 2014-12-06 ENCOUNTER — Other Ambulatory Visit (HOSPITAL_COMMUNITY)
Admission: RE | Admit: 2014-12-06 | Discharge: 2014-12-06 | Disposition: A | Discharge status: Home / Self Care | Payer: BC Managed Care – PPO | Source: Ambulatory Visit | Attending: Physician Assistant | Admitting: Physician Assistant

## 2014-12-06 ENCOUNTER — Ambulatory Visit (INDEPENDENT_AMBULATORY_CARE_PROVIDER_SITE_OTHER): Payer: BC Managed Care – PPO | Admitting: Physician Assistant

## 2014-12-06 VITALS — BP 110/70 | HR 84 | Temp 98.1°F | Resp 16 | Ht 65.5 in | Wt 130.0 lb

## 2014-12-06 DIAGNOSIS — R140 Abdominal distension (gaseous): Secondary | ICD-10-CM

## 2014-12-06 DIAGNOSIS — Z79899 Other long term (current) drug therapy: Secondary | ICD-10-CM

## 2014-12-06 DIAGNOSIS — Z3009 Encounter for other general counseling and advice on contraception: Secondary | ICD-10-CM

## 2014-12-06 DIAGNOSIS — Z1151 Encounter for screening for human papillomavirus (HPV): Secondary | ICD-10-CM | POA: Diagnosis present

## 2014-12-06 DIAGNOSIS — R002 Palpitations: Secondary | ICD-10-CM

## 2014-12-06 DIAGNOSIS — E559 Vitamin D deficiency, unspecified: Secondary | ICD-10-CM

## 2014-12-06 DIAGNOSIS — Z124 Encounter for screening for malignant neoplasm of cervix: Secondary | ICD-10-CM

## 2014-12-06 DIAGNOSIS — Z01419 Encounter for gynecological examination (general) (routine) without abnormal findings: Secondary | ICD-10-CM | POA: Insufficient documentation

## 2014-12-06 DIAGNOSIS — Z309 Encounter for contraceptive management, unspecified: Secondary | ICD-10-CM

## 2014-12-06 DIAGNOSIS — R102 Pelvic and perineal pain: Secondary | ICD-10-CM

## 2014-12-06 DIAGNOSIS — Z0001 Encounter for general adult medical examination with abnormal findings: Secondary | ICD-10-CM

## 2014-12-06 DIAGNOSIS — R197 Diarrhea, unspecified: Secondary | ICD-10-CM

## 2014-12-06 DIAGNOSIS — M26609 Unspecified temporomandibular joint disorder, unspecified side: Secondary | ICD-10-CM

## 2014-12-06 DIAGNOSIS — M2660 Temporomandibular joint disorder, unspecified: Secondary | ICD-10-CM

## 2014-12-06 DIAGNOSIS — R6889 Other general symptoms and signs: Secondary | ICD-10-CM

## 2014-12-06 DIAGNOSIS — Z0000 Encounter for general adult medical examination without abnormal findings: Secondary | ICD-10-CM

## 2014-12-06 LAB — LIPID PANEL
CHOL/HDL RATIO: 2.3 ratio
CHOLESTEROL: 122 mg/dL (ref 0–200)
HDL: 54 mg/dL (ref >=46)
LDL Cholesterol: 60 mg/dL (ref 0–99)
TRIGLYCERIDES: 41 mg/dL (ref <150)
VLDL: 8 mg/dL (ref 0–40)

## 2014-12-06 LAB — BASIC METABOLIC PANEL WITH GFR
BUN: 11 mg/dL (ref 6–23)
CHLORIDE: 104 meq/L (ref 96–112)
CO2: 26 mEq/L (ref 19–32)
CREATININE: 0.56 mg/dL (ref 0.50–1.10)
Calcium: 9.1 mg/dL (ref 8.4–10.5)
GFR, Est Non African American: >89 mL/min
GLUCOSE: 90 mg/dL (ref 70–99)
Potassium: 4.5 mEq/L (ref 3.5–5.3)
Sodium: 139 mEq/L (ref 135–145)

## 2014-12-06 LAB — CBC WITH DIFFERENTIAL/PLATELET
BASOS ABS: 0 10*3/uL (ref 0.0–0.1)
Basophils Relative: 1 % (ref 0–1)
EOS ABS: 0.1 10*3/uL (ref 0.0–0.7)
EOS PCT: 3 % (ref 0–5)
HCT: 39 % (ref 36.0–46.0)
Hemoglobin: 13.3 g/dL (ref 12.0–15.0)
LYMPHS PCT: 28 % (ref 12–46)
Lymphs Abs: 1.1 10*3/uL (ref 0.7–4.0)
MCH: 33.8 pg (ref 26.0–34.0)
MCHC: 34.1 g/dL (ref 30.0–36.0)
MCV: 99 fL (ref 78.0–100.0)
MPV: 9.4 fL (ref 8.6–12.4)
Monocytes Absolute: 0.5 10*3/uL (ref 0.1–1.0)
Monocytes Relative: 11 % (ref 3–12)
Neutro Abs: 2.3 10*3/uL (ref 1.7–7.7)
Neutrophils Relative %: 57 % (ref 43–77)
Platelets: 250 10*3/uL (ref 150–400)
RBC: 3.94 MIL/uL (ref 3.87–5.11)
RDW: 13 % (ref 11.5–15.5)
WBC: 4.1 10*3/uL (ref 4.0–10.5)

## 2014-12-06 LAB — HEPATIC FUNCTION PANEL
ALBUMIN: 4.4 g/dL (ref 3.5–5.2)
ALK PHOS: 38 U/L — AB (ref 39–117)
ALT: 17 U/L (ref 0–35)
AST: 19 U/L (ref 0–37)
Bilirubin, Direct: 0.2 mg/dL (ref 0.0–0.3)
Indirect Bilirubin: 0.4 mg/dL (ref 0.2–1.2)
TOTAL PROTEIN: 6.8 g/dL (ref 6.0–8.3)
Total Bilirubin: 0.6 mg/dL (ref 0.2–1.2)

## 2014-12-06 LAB — TSH: TSH: 2.006 u[IU]/mL (ref 0.350–4.500)

## 2014-12-06 LAB — MAGNESIUM: MAGNESIUM: 2.1 mg/dL (ref 1.5–2.5)

## 2014-12-06 NOTE — Progress Notes (Signed)
Complete Physical  Assessment and Plan: Diarrhea- continue to monitor food intake/keep food diary.  Urgency/frequency- rule out UTI, versus ICS.  TMJ-information given to the patient, no gum/decrease hard foods, warm wet wash clothes, decrease stress, talk with dentist about possible night guard, can do massage, and exercise.  Get MGM BCP discussed Health Maintenance  Discussed med's effects and SE's. Screening labs and tests as requested with regular follow-up as recommended.  HPI 44 y.o. female  presents for a complete physical. Her blood pressure has been controlled at home, today their BP is BP: 110/70 mmHg Patient is on Vitamin D supplement.  Has a boyfriend, has been dating since Nov, has never been sexually active, she is interested in getting on BCP in case that does happen. She does have some moodiness before her menses and have heavy menses.  She has had diarrhea in the past, will get with fatty foods but has been more constipated the last few weeks.    Current Medications:  Current Outpatient Prescriptions on File Prior to Visit  Medication Sig Dispense Refill  . b complex vitamins tablet Take 1 tablet by mouth daily.    . Cholecalciferol (VITAMIN D) 2000 UNITS tablet Take 10,000 Units by mouth daily.     . fish oil-omega-3 fatty acids 1000 MG capsule Take 1 g by mouth daily.    Nyoka Cowden Tea 250 MG CAPS Take by mouth 2 (two) times daily.    . ondansetron (ZOFRAN-ODT) 8 MG disintegrating tablet Take 1 tablet (8 mg total) by mouth every 8 (eight) hours as needed for nausea or vomiting. 10 tablet 0  . predniSONE (DELTASONE) 20 MG tablet Take one pill two times daily for 3 days, take one pill daily for 4 days. 10 tablet 0  . Probiotic Product (PROBIOTIC DAILY PO) Take 1 capsule by mouth daily.    . vitamin C (ASCORBIC ACID) 500 MG tablet Take 1,000 mg by mouth daily.    . vitamin E 400 UNIT capsule Take 400 Units by mouth daily.     No current facility-administered medications  on file prior to visit.   Health Maintenance:  Immunization History  Administered Date(s) Administered  . Hepatitis A 10/02/1997  . Hepatitis B 12/04/1997  . Td 10/10/2008   Tetanus: 2010 Pneumovax: N/A Flu vaccine: N/A Zostavax: N/A Pap:2013 Normal never had abnormal pap and has never been sexually active.  MGM: 12/2013 at [facility], due this year DEXA: N/A Colonoscopy: N/A EGD: N/A  Allergies:  Allergies  Allergen Reactions  . Milk-Related Compounds Diarrhea    Stomach cramping  . Poison Ivy Extract [Extract Of Poison Ivy] Hives, Itching and Rash   Medical History: No past medical history on file. Surgical History: No past surgical history on file. Family History:  Family History  Problem Relation Age of Onset  . Cancer Mother     ovarian  . Cancer Father     prosate  . Ulcerative colitis Sister    Social History:  History  Substance Use Topics  . Smoking status: Never Smoker   . Smokeless tobacco: Not on file  . Alcohol Use: No   Review of Systems  Constitutional: Negative.   HENT: Negative.   Eyes: Negative.   Respiratory: Negative.   Cardiovascular: Negative.   Gastrointestinal: Negative.   Genitourinary: Negative.   Musculoskeletal: Positive for back pain. Negative for myalgias, joint pain, falls and neck pain.  Skin: Negative.   Neurological: Negative.   Endo/Heme/Allergies: Negative.   Psychiatric/Behavioral: Negative.  Physical Exam: Estimated body mass index is 21.3 kg/(m^2) as calculated from the following:   Height as of this encounter: 5' 5.5" (1.664 m).   Weight as of this encounter: 130 lb (58.968 kg). Filed Vitals:   12/06/14 1414  BP: 110/70  Pulse: 84  Temp: 98.1 F (36.7 C)  Resp: 16   General Appearance: Well nourished, in no apparent distress. Eyes: PERRLA, EOMs, conjunctiva no swelling or erythema, normal fundi and vessels. Sinuses: No Frontal/maxillary tenderness ENT/Mouth: Ext aud canals clear, normal light reflex with  TMs without erythema, bulging.  Good dentition. No erythema, swelling, or exudate on post pharynx. Tonsils not swollen or erythematous. Hearing normal.  Neck: Supple, thyroid normal. No bruits Respiratory: Respiratory effort normal, BS equal bilaterally without rales, rhonchi, wheezing or stridor. Cardio: RRR without murmurs, rubs or gallops. Brisk peripheral pulses without edema.  Chest: symmetric, with normal excursions and percussion. Breasts: Symmetric, without lumps, nipple discharge, retractions. Abdomen: Soft, +BS. Non tender, no guarding, rebound, hernias, masses, or organomegaly. .  Lymphatics: Non tender without lymphadenopathy.  Genitourinary: normal external genitalia, vulva, vagina, cervix, uterus and adnexa, PAP: Pap smear done today. Musculoskeletal: Full ROM all peripheral extremities,5/5 strength, and normal gait. Skin: Warm, dry without rashes, lesions, ecchymosis.  Neuro: Cranial nerves intact, reflexes equal bilaterally. Normal muscle tone, no cerebellar symptoms. Sensation intact.  Psych: Awake and oriented X 3, normal affect, Insight and Judgment appropriate.   EKG: not needed  Vicie Mutters 2:23 PM

## 2014-12-06 NOTE — Patient Instructions (Signed)
[facility]  7 a.m.-6:30 p.m., Monday 7 a.m.-5 p.m., Tuesday-Friday Schedule an appointment by calling [phone].  [facility] Schedule an appointment by calling [phone].

## 2014-12-07 LAB — URINALYSIS, MICROSCOPIC ONLY
BACTERIA UA: NONE SEEN
Casts: NONE SEEN
Crystals: NONE SEEN
Squamous Epithelial / LPF: NONE SEEN

## 2014-12-07 LAB — URINALYSIS, ROUTINE W REFLEX MICROSCOPIC
BILIRUBIN URINE: NEGATIVE
GLUCOSE, UA: NEGATIVE mg/dL
Hgb urine dipstick: NEGATIVE
Ketones, ur: NEGATIVE mg/dL
Nitrite: NEGATIVE
PH: 6.5 (ref 5.0–8.0)
Protein, ur: NEGATIVE mg/dL
Urobilinogen, UA: 0.2 mg/dL (ref 0.0–1.0)

## 2014-12-07 LAB — VITAMIN D 25 HYDROXY (VIT D DEFICIENCY, FRACTURES): Vit D, 25-Hydroxy: 68 ng/mL (ref 30–100)

## 2014-12-07 LAB — URINE CULTURE
Colony Count: NO GROWTH
ORGANISM ID, BACTERIA: NO GROWTH

## 2014-12-10 LAB — CYTOLOGY - PAP

## 2015-03-01 ENCOUNTER — Encounter: Payer: Self-pay | Admitting: Physician Assistant

## 2015-03-01 ENCOUNTER — Other Ambulatory Visit: Payer: Self-pay | Admitting: Physician Assistant

## 2015-03-01 MED ORDER — DROSPIRENONE-ETHINYL ESTRADIOL 3-0.03 MG PO TABS: 1.0000 | ORAL_TABLET | Freq: Every day | ORAL | Status: DC

## 2015-04-12 ENCOUNTER — Encounter: Payer: Self-pay | Admitting: Physician Assistant

## 2015-06-22 ENCOUNTER — Other Ambulatory Visit: Payer: Self-pay | Admitting: Physician Assistant

## 2015-06-22 ENCOUNTER — Other Ambulatory Visit: Payer: Self-pay | Admitting: Internal Medicine

## 2015-06-22 DIAGNOSIS — Z30019 Encounter for initial prescription of contraceptives, unspecified: Secondary | ICD-10-CM

## 2015-06-22 MED ORDER — DROSPIRENONE-ETHINYL ESTRADIOL 3-0.03 MG PO TABS: 1.0000 | ORAL_TABLET | Freq: Every day | ORAL | Status: DC

## 2015-10-21 ENCOUNTER — Encounter: Payer: Self-pay | Admitting: Physician Assistant

## 2015-10-31 ENCOUNTER — Encounter: Payer: Self-pay | Admitting: Physician Assistant

## 2015-10-31 ENCOUNTER — Encounter: Payer: Self-pay | Admitting: Internal Medicine

## 2015-10-31 ENCOUNTER — Ambulatory Visit (INDEPENDENT_AMBULATORY_CARE_PROVIDER_SITE_OTHER): Payer: BC Managed Care – PPO | Admitting: Internal Medicine

## 2015-10-31 VITALS — BP 108/60 | HR 82 | Temp 98.1°F | Resp 16 | Ht 65.5 in | Wt 146.4 lb

## 2015-10-31 DIAGNOSIS — M722 Plantar fascial fibromatosis: Secondary | ICD-10-CM

## 2015-10-31 MED ORDER — PREDNISONE 20 MG PO TABS: ORAL_TABLET | ORAL | Status: DC

## 2015-10-31 NOTE — Patient Instructions (Signed)
Plantar Fasciitis Plantar fasciitis is a painful foot condition that affects the heel. It occurs when the band of tissue that connects the toes to the heel bone (plantar fascia) becomes irritated. This can happen after exercising too much or doing other repetitive activities (overuse injury). The pain from plantar fasciitis can range from mild irritation to severe pain that makes it difficult for you to walk or move. The pain is usually worse in the morning or after you have been sitting or lying down for a while. CAUSES This condition may be caused by:  Standing for long periods of time.  Wearing shoes that do not fit.  Doing high-impact activities, including running, aerobics, and ballet.  Being overweight.  Having an abnormal way of walking (gait).  Having tight calf muscles.  Having high arches in your feet.  Starting a new athletic activity. SYMPTOMS The main symptom of this condition is heel pain. Other symptoms include:  Pain that gets worse after activity or exercise.  Pain that is worse in the morning or after resting.  Pain that goes away after you walk for a few minutes. DIAGNOSIS This condition may be diagnosed based on your signs and symptoms. Your health care provider will also do a physical exam to check for:  A tender area on the bottom of your foot.  A high arch in your foot.  Pain when you move your foot.  Difficulty moving your foot. You may also need to have imaging studies to confirm the diagnosis. These can include:  X-rays.  Ultrasound.  MRI. TREATMENT  Treatment for plantar fasciitis depends on the severity of the condition. Your treatment may include:  Rest, ice, and over-the-counter pain medicines to manage your pain.  Exercises to stretch your calves and your plantar fascia.  A splint that holds your foot in a stretched, upward position while you sleep (night splint).  Physical therapy to relieve symptoms and prevent problems in the  future.  Cortisone injections to relieve severe pain.  Extracorporeal shock wave therapy (ESWT) to stimulate damaged plantar fascia with electrical impulses. It is often used as a last resort before surgery.  Surgery, if other treatments have not worked after 12 months. HOME CARE INSTRUCTIONS  Take medicines only as directed by your health care provider.  Avoid activities that cause pain.  Roll the bottom of your foot over a bag of ice or a bottle of cold water. Do this for 20 minutes, 3-4 times a day.  Perform simple stretches as directed by your health care provider.  Try wearing athletic shoes with air-sole or gel-sole cushions or soft shoe inserts.  Wear a night splint while sleeping, if directed by your health care provider.  Keep all follow-up appointments with your health care provider. PREVENTION   Do not perform exercises or activities that cause heel pain.  Consider finding low-impact activities if you continue to have problems.  Lose weight if you need to. The best way to prevent plantar fasciitis is to avoid the activities that aggravate your plantar fascia. SEEK MEDICAL CARE IF:  Your symptoms do not go away after treatment with home care measures.  Your pain gets worse.  Your pain affects your ability to move or do your daily activities.   This information is not intended to replace advice given to you by your health care provider. Make sure you discuss any questions you have with your health care provider.   Document Released: 06/09/2001 Document Revised: 06/05/2015 Document Reviewed: 07/25/2014 Elsevier  Interactive Patient Education [employer].

## 2015-11-01 ENCOUNTER — Other Ambulatory Visit: Payer: Self-pay | Admitting: *Deleted

## 2015-11-01 DIAGNOSIS — M722 Plantar fascial fibromatosis: Secondary | ICD-10-CM

## 2015-11-01 MED ORDER — PREDNISONE 20 MG PO TABS: ORAL_TABLET | ORAL | Status: DC

## 2015-11-02 ENCOUNTER — Encounter: Payer: Self-pay | Admitting: Internal Medicine

## 2015-11-02 NOTE — Progress Notes (Signed)
  Subjective:    Patient ID: Hannah Woodard, female    DOB: December 11, 1970, 45 y.o.   MRN: [id number]  HPI  Very nice 45 yo SWF with c/o 2-3 week hx/o Lt heel pains aggravated by walking & running. Doe use arch supports.   Medication Sig  . b complex vitamins tablet Take 1 tablet by mouth daily.  . Cholecalciferol (VITAMIN D) 2000 UNITS tablet Take 10,000 Units by mouth daily.   . drospirenone-ethinyl estradiol (OCELLA) 3-0.03 MG tablet Take 1 tablet by mouth daily.  . fish oil-omega-3 fatty acids 1000 MG capsule Take 1 g by mouth daily.  Nyoka Cowden Tea 250 MG CAPS Take by mouth 2 (two) times daily.  . Magnesium Ascorbate POWD by Does not apply route Nightly. PRN  . Probiotic Product (PROBIOTIC DAILY PO) Take 1 capsule by mouth daily.  . vitamin C (ASCORBIC ACID) 500 MG tablet Take 1,000 mg by mouth daily.  . vitamin E 400 UNIT capsule Take 400 Units by mouth daily.  . ondansetron (ZOFRAN-ODT) 8 MG disintegrating tablet Take 1 tablet (8 mg total) by mouth every 8 (eight) hours as needed for nausea or vomiting. (Patient not taking: Reported on 10/31/2015)   Allergies  Allergen Reactions  . Milk-Related Compounds Diarrhea    Stomach cramping  . Poison Ivy Extract [Extract Of Poison Ivy] Hives, Itching and Rash   Neg PMH  Review of Systems  10 point systems review negative except as above.    Objective:   Physical Exam  BP 108/60 mmHg  Pulse 82  Temp(Src) 98.1 F (36.7 C) (Temporal)  Resp 16  Ht 5' 5.5" (1.664 m)  Wt 146 lb 6.4 oz (66.407 kg)  BMI 23.98 kg/m2  SpO2 97%  LMP 10/08/2015  Exam focused to the left foot find a medium to high arch with a tende trigger point at the anterior calcaneous.    Assessment & Plan:   1. Plantar fasciitis of left foot  - Rx Prednisone 20 mg #20 tabs  Pulse/taper  - Discussed arch supports and recommended view video on YouTube .

## 2015-11-24 ENCOUNTER — Other Ambulatory Visit: Payer: Self-pay | Admitting: Internal Medicine

## 2015-12-04 ENCOUNTER — Encounter: Payer: Self-pay | Admitting: Physician Assistant

## 2015-12-09 ENCOUNTER — Ambulatory Visit (INDEPENDENT_AMBULATORY_CARE_PROVIDER_SITE_OTHER): Payer: BC Managed Care – PPO | Admitting: Physician Assistant

## 2015-12-09 ENCOUNTER — Encounter: Payer: Self-pay | Admitting: Physician Assistant

## 2015-12-09 VITALS — BP 108/70 | HR 89 | Temp 97.7°F | Resp 16 | Ht 66.5 in | Wt 151.2 lb

## 2015-12-09 DIAGNOSIS — E559 Vitamin D deficiency, unspecified: Secondary | ICD-10-CM | POA: Diagnosis not present

## 2015-12-09 DIAGNOSIS — Z0001 Encounter for general adult medical examination with abnormal findings: Secondary | ICD-10-CM

## 2015-12-09 DIAGNOSIS — Z131 Encounter for screening for diabetes mellitus: Secondary | ICD-10-CM

## 2015-12-09 DIAGNOSIS — R6889 Other general symptoms and signs: Secondary | ICD-10-CM | POA: Diagnosis not present

## 2015-12-09 DIAGNOSIS — I10 Essential (primary) hypertension: Secondary | ICD-10-CM

## 2015-12-09 DIAGNOSIS — R5383 Other fatigue: Secondary | ICD-10-CM

## 2015-12-09 DIAGNOSIS — Z79899 Other long term (current) drug therapy: Secondary | ICD-10-CM | POA: Diagnosis not present

## 2015-12-09 DIAGNOSIS — R922 Inconclusive mammogram: Secondary | ICD-10-CM | POA: Insufficient documentation

## 2015-12-09 DIAGNOSIS — Z0000 Encounter for general adult medical examination without abnormal findings: Secondary | ICD-10-CM

## 2015-12-09 DIAGNOSIS — Z13220 Encounter for screening for lipoid disorders: Secondary | ICD-10-CM

## 2015-12-09 DIAGNOSIS — Z1389 Encounter for screening for other disorder: Secondary | ICD-10-CM

## 2015-12-09 LAB — CBC WITH DIFFERENTIAL/PLATELET
BASOS ABS: 0.1 10*3/uL (ref 0.0–0.1)
Basophils Relative: 1 % (ref 0–1)
Eosinophils Absolute: 0.1 10*3/uL (ref 0.0–0.7)
Eosinophils Relative: 2 % (ref 0–5)
HEMATOCRIT: 40.8 % (ref 36.0–46.0)
HEMOGLOBIN: 13.4 g/dL (ref 12.0–15.0)
LYMPHS PCT: 21 % (ref 12–46)
Lymphs Abs: 1.4 10*3/uL (ref 0.7–4.0)
MCH: 32.5 pg (ref 26.0–34.0)
MCHC: 32.8 g/dL (ref 30.0–36.0)
MCV: 99 fL (ref 78.0–100.0)
MPV: 9.2 fL (ref 8.6–12.4)
Monocytes Absolute: 0.5 10*3/uL (ref 0.1–1.0)
Monocytes Relative: 7 % (ref 3–12)
NEUTROS ABS: 4.7 10*3/uL (ref 1.7–7.7)
NEUTROS PCT: 69 % (ref 43–77)
Platelets: 350 10*3/uL (ref 150–400)
RBC: 4.12 MIL/uL (ref 3.87–5.11)
RDW: 12.9 % (ref 11.5–15.5)
WBC: 6.8 10*3/uL (ref 4.0–10.5)

## 2015-12-09 MED ORDER — DROSPIRENONE-ETHINYL ESTRADIOL 3-0.03 MG PO TABS: 1.0000 | ORAL_TABLET | Freq: Every day | ORAL | Status: DC

## 2015-12-09 NOTE — Progress Notes (Signed)
Complete Physical  Assessment and Plan: 1. Routine general medical examination at a health care facility - Iodine, Serum/Plasma - CBC with Differential/Platelet - BASIC METABOLIC PANEL WITH GFR - Hepatic function panel - TSH - Lipid panel - Hemoglobin A1c - Magnesium - VITAMIN D 25 Hydroxy (Vit-D Deficiency, Fractures) - Urinalysis, Routine w reflex microscopic (not at [facility]) - Microalbumin / creatinine urine ratio - Iron and TIBC - Ferritin - Vitamin B12 - EKG 12-Lead  2. Vitamin D deficiency - VITAMIN D 25 Hydroxy (Vit-D Deficiency, Fractures)  3. Screening cholesterol level - Lipid panel  4. Screening for diabetes mellitus - Hemoglobin A1c  5. Screening for blood or protein in urine - Urinalysis, Routine w reflex microscopic (not at [facility]) - Microalbumin / creatinine urine ratio  6. Medication management - Iodine, Serum/Plasma - CBC with Differential/Platelet - BASIC METABOLIC PANEL WITH GFR - Hepatic function panel - Magnesium  7. Dense breast tissue Get MGM  8. Other fatigue - TSH - Iron and TIBC - Ferritin - Vitamin B12 - EKG 12-Lead  Discussed med's effects and SE's. Screening labs and tests as requested with regular follow-up as recommended.  HPI 45 y.o. female  presents for a complete physical. Her blood pressure has been controlled at home, today their BP is BP: 108/70 mmHg Patient is on Vitamin D supplement.  She got married x 7 months, has been on BCP but is interested in getting pregnant.  Patient has noticed she has been having pale gums recently, has not seen dentist recently.  Going to [place] in June.   Current Medications:  Current Outpatient Prescriptions on File Prior to Visit  Medication Sig Dispense Refill  . b complex vitamins tablet Take 1 tablet by mouth daily.    . Cholecalciferol (VITAMIN D) 2000 UNITS tablet Take 10,000 Units by mouth daily.     . drospirenone-ethinyl estradiol (YASMIN,ZARAH,SYEDA) 3-0.03 MG tablet TAKE 1  TABLET BY MOUTH DAILY. 84 tablet 0  . fish oil-omega-3 fatty acids 1000 MG capsule Take 1 g by mouth daily.    Nyoka Cowden Tea 250 MG CAPS Take by mouth 2 (two) times daily.    . Magnesium Ascorbate POWD by Does not apply route Nightly. PRN    . Multiple Vitamins-Minerals (ZINC PO) Take by mouth.    . Probiotic Product (PROBIOTIC DAILY PO) Take 1 capsule by mouth daily.    . vitamin C (ASCORBIC ACID) 500 MG tablet Take 1,000 mg by mouth daily.    . vitamin E 400 UNIT capsule Take 400 Units by mouth daily.     No current facility-administered medications on file prior to visit.   Health Maintenance:  Immunization History  Administered Date(s) Administered  . Hepatitis A 10/02/1997  . Hepatitis B 12/04/1997  . Td 10/10/2008   Tetanus: 2010 Pneumovax: N/A Flu vaccine: N/A Zostavax: N/A Pap: 2016, never normal pap, due 2019 MGM: 12/2013 at [facility], due this year DEXA: N/A Colonoscopy: N/A EGD: N/A  Allergies:  Allergies  Allergen Reactions  . Milk-Related Compounds Diarrhea    Stomach cramping  . Poison Ivy Extract [Extract Of Poison Ivy] Hives, Itching and Rash   Medical History: No past medical history on file. Surgical History: No past surgical history on file. Family History:  Family History  Problem Relation Age of Onset  . Cancer Mother     ovarian  . Cancer Father     prosate  . Ulcerative colitis Sister    Social History:  Social History  Substance Use  Topics  . Smoking status: Never Smoker   . Smokeless tobacco: None  . Alcohol Use: No   Review of Systems  Constitutional: Negative.   HENT: Negative.   Eyes: Negative.   Respiratory: Negative.   Cardiovascular: Negative.   Gastrointestinal: Negative.   Genitourinary: Negative.   Musculoskeletal: Positive for back pain. Negative for myalgias, joint pain, falls and neck pain.  Skin: Negative.   Neurological: Negative.   Endo/Heme/Allergies: Negative.   Psychiatric/Behavioral: Negative.      Physical  Exam: Estimated body mass index is 24.04 kg/(m^2) as calculated from the following:   Height as of this encounter: 5' 6.5" (1.689 m).   Weight as of this encounter: 151 lb 3.2 oz (68.584 kg). Filed Vitals:   12/09/15 1458  BP: 108/70  Pulse: 89  Temp: 97.7 F (36.5 C)  Resp: 16   General Appearance: Well nourished, in no apparent distress. Eyes: PERRLA, EOMs, conjunctiva no swelling or erythema, normal fundi and vessels. Sinuses: No Frontal/maxillary tenderness ENT/Mouth: Ext aud canals clear, normal light reflex with TMs without erythema, bulging.  Good dentition. No erythema, swelling, or exudate on post pharynx. Tonsils not swollen or erythematous. Hearing normal.  Neck: Supple, thyroid normal. No bruits Respiratory: Respiratory effort normal, BS equal bilaterally without rales, rhonchi, wheezing or stridor. Cardio: RRR without murmurs, rubs or gallops. Brisk peripheral pulses without edema.  Chest: symmetric, with normal excursions and percussion. Breasts: Symmetric, very dense breast, without nipple discharge, retractions. Abdomen: Soft, +BS. Non tender, no guarding, rebound, hernias, masses, or organomegaly. Lymphatics: Non tender without lymphadenopathy.  Genitourinary: defer Musculoskeletal: Full ROM all peripheral extremities,5/5 strength, and normal gait. Skin: Warm, dry without rashes, lesions, ecchymosis.  Neuro: Cranial nerves intact, reflexes equal bilaterally. Normal muscle tone, no cerebellar symptoms. Sensation intact.  Psych: Awake and oriented X 3, normal affect, Insight and Judgment appropriate.   EKG: WNL  Vicie Mutters 3:12 PM

## 2015-12-09 NOTE — Patient Instructions (Addendum)
[facility]  7 a.m.-6:30 p.m., Monday 7 a.m.-5 p.m., Tuesday-Friday Schedule an appointment by calling [phone].  The 3D Mammogram is much more specific and sensitive to pick up breast cancer. For women with fibrocystic breast or lumpy breast it can be hard to determine if it is cancer or not but the 3D mammogram is able to tell this difference which cuts back on unneeded additional tests or scary call backs.   - over 40% increase in detection of breast cancer - over 40% reduction in false positives.  - fewer call backs - reduced anxiety - improved outcomes - [facility]   Preventive Care for Adults  A healthy lifestyle and preventive care can promote health and wellness. Preventive health guidelines for women include the following key practices.  A routine yearly physical is a good way to check with your health care provider about your health and preventive screening. It is a chance to share any concerns and updates on your health and to receive a thorough exam.  Visit your dentist for a routine exam and preventive care every 6 months. Brush your teeth twice a day and floss once a day. Good oral hygiene prevents tooth decay and gum disease.  The frequency of eye exams is based on your age, health, family medical history, use of contact lenses, and other factors. Follow your health care provider's recommendations for frequency of eye exams.  Eat a healthy diet. Foods like vegetables, fruits, whole grains, low-fat dairy products, and lean protein foods contain the nutrients you need without too many calories. Decrease your intake of foods high in solid fats, added sugars, and salt. Eat the right amount of calories for you.Get information about a proper diet from your health care provider, if necessary.  Regular physical exercise is one of the most important things you can do for your health. Most adults should get at least 150 minutes of  moderate-intensity exercise (any activity that increases your heart rate and causes you to sweat) each week. In addition, most adults need muscle-strengthening exercises on 2 or more days a week.  Maintain a healthy weight. The body mass index (BMI) is a screening tool to identify possible weight problems. It provides an estimate of body fat based on height and weight. Your health care provider can find your BMI and can help you achieve or maintain a healthy weight.For adults 20 years and older:  A BMI below 18.5 is considered underweight.  A BMI of 18.5 to 24.9 is normal.  A BMI of 25 to 29.9 is considered overweight.  A BMI of 30 and above is considered obese.  Maintain normal blood lipids and cholesterol levels by exercising and minimizing your intake of saturated fat. Eat a balanced diet with plenty of fruit and vegetables. Blood tests for lipids and cholesterol should begin at age 46 and be repeated every 5 years. If your lipid or cholesterol levels are high, you are over 50, or you are at high risk for heart disease, you may need your cholesterol levels checked more frequently.Ongoing high lipid and cholesterol levels should be treated with medicines if diet and exercise are not working.  If you smoke, find out from your health care provider how to quit. If you do not use tobacco, do not start.  Lung cancer screening is recommended for adults aged 30-80 years who are at high risk for developing lung cancer because of a history of smoking. A yearly low-dose CT scan  of the lungs is recommended for people who have at least a 30-pack-year history of smoking and are a current smoker or have quit within the past 15 years. A pack year of smoking is smoking an average of 1 pack of cigarettes a day for 1 year (for example: 1 pack a day for 30 years or 2 packs a day for 15 years). Yearly screening should continue until the smoker has stopped smoking for at least 15 years. Yearly screening should be  stopped for people who develop a health problem that would prevent them from having lung cancer treatment.  High blood pressure causes heart disease and increases the risk of stroke. Your blood pressure should be checked at least every 1 to 2 years. Ongoing high blood pressure should be treated with medicines if weight loss and exercise do not work.  If you are 17-53 years old, ask your health care provider if you should take aspirin to prevent strokes.  Diabetes screening involves taking a blood sample to check your fasting blood sugar level. This should be done once every 3 years, after age 53, if you are within normal weight and without risk factors for diabetes. Testing should be considered at a younger age or be carried out more frequently if you are overweight and have at least 1 risk factor for diabetes.  Breast cancer screening is essential preventive care for women. You should practice "breast self-awareness." This means understanding the normal appearance and feel of your breasts and may include breast self-examination. Any changes detected, no matter how small, should be reported to a health care provider. Women in their 50s and 30s should have a clinical breast exam (CBE) by a health care provider as part of a regular health exam every 1 to 3 years. After age 57, women should have a CBE every year. Starting at age 31, women should consider having a mammogram (breast X-ray test) every year. Women who have a family history of breast cancer should talk to their health care provider about genetic screening. Women at a high risk of breast cancer should talk to their health care providers about having an MRI and a mammogram every year.  Breast cancer gene (BRCA)-related cancer risk assessment is recommended for women who have family members with BRCA-related cancers. BRCA-related cancers include breast, ovarian, tubal, and peritoneal cancers. Having family members with these cancers may be associated  with an increased risk for harmful changes (mutations) in the breast cancer genes BRCA1 and BRCA2. Results of the assessment will determine the need for genetic counseling and BRCA1 and BRCA2 testing.  Routine pelvic exams to screen for cancer are no longer recommended for nonpregnant women who are considered low risk for cancer of the pelvic organs (ovaries, uterus, and vagina) and who do not have symptoms. Ask your health care provider if a screening pelvic exam is right for you.  If you have had past treatment for cervical cancer or a condition that could lead to cancer, you need Pap tests and screening for cancer for at least 20 years after your treatment. If Pap tests have been discontinued, your risk factors (such as having a new sexual partner) need to be reassessed to determine if screening should be resumed. Some women have medical problems that increase the chance of getting cervical cancer. In these cases, your health care provider may recommend more frequent screening and Pap tests.  Colorectal cancer can be detected and often prevented. Most routine colorectal cancer screening begins at the  age of 60 years and continues through age 85 years. However, your health care provider may recommend screening at an earlier age if you have risk factors for colon cancer. On a yearly basis, your health care provider may provide home test kits to check for hidden blood in the stool. Use of a small camera at the end of a tube, to directly examine the colon (sigmoidoscopy or colonoscopy), can detect the earliest forms of colorectal cancer. Talk to your health care provider about this at age 25, when routine screening begins. Direct exam of the colon should be repeated every 5-10 years through age 37 years, unless early forms of pre-cancerous polyps or small growths are found.  Hepatitis C blood testing is recommended for all people born from [birth year] through 1965 and any individual with known risks for hepatitis  C.  Pra  Osteoporosis is a disease in which the bones lose minerals and strength with aging. This can result in serious bone fractures or breaks. The risk of osteoporosis can be identified using a bone density scan. Women ages 11 years and over and women at risk for fractures or osteoporosis should discuss screening with their health care providers. Ask your health care provider whether you should take a calcium supplement or vitamin D to reduce the rate of osteoporosis.  Menopause can be associated with physical symptoms and risks. Hormone replacement therapy is available to decrease symptoms and risks. You should talk to your health care provider about whether hormone replacement therapy is right for you.  Use sunscreen. Apply sunscreen liberally and repeatedly throughout the day. You should seek shade when your shadow is shorter than you. Protect yourself by wearing long sleeves, pants, a wide-brimmed hat, and sunglasses year round, whenever you are outdoors.  Once a month, do a whole body skin exam, using a mirror to look at the skin on your back. Tell your health care provider of new moles, moles that have irregular borders, moles that are larger than a pencil eraser, or moles that have changed in shape or color.  Stay current with required vaccines (immunizations).  Influenza vaccine. All adults should be immunized every year.  Tetanus, diphtheria, and acellular pertussis (Td, Tdap) vaccine. Pregnant women should receive 1 dose of Tdap vaccine during each pregnancy. The dose should be obtained regardless of the length of time since the last dose. Immunization is preferred during the 27th-36th week of gestation. An adult who has not previously received Tdap or who does not know her vaccine status should receive 1 dose of Tdap. This initial dose should be followed by tetanus and diphtheria toxoids (Td) booster doses every 10 years. Adults with an unknown or incomplete history of completing a  3-dose immunization series with Td-containing vaccines should begin or complete a primary immunization series including a Tdap dose. Adults should receive a Td booster every 10 years.  Varicella vaccine. An adult without evidence of immunity to varicella should receive 2 doses or a second dose if she has previously received 1 dose. Pregnant females who do not have evidence of immunity should receive the first dose after pregnancy. This first dose should be obtained before leaving the health care facility. The second dose should be obtained 4-8 weeks after the first dose.  Human papillomavirus (HPV) vaccine. Females aged 13-26 years who have not received the vaccine previously should obtain the 3-dose series. The vaccine is not recommended for use in pregnant females. However, pregnancy testing is not needed before receiving a dose. If  a female is found to be pregnant after receiving a dose, no treatment is needed. In that case, the remaining doses should be delayed until after the pregnancy. Immunization is recommended for any person with an immunocompromised condition through the age of 83 years if she did not get any or all doses earlier. During the 3-dose series, the second dose should be obtained 4-8 weeks after the first dose. The third dose should be obtained 24 weeks after the first dose and 16 weeks after the second dose.  Zoster vaccine. One dose is recommended for adults aged 73 years or older unless certain conditions are present.  Measles, mumps, and rubella (MMR) vaccine. Adults born before [birth year] generally are considered immune to measles and mumps. Adults born in [birth year] or later should have 1 or more doses of MMR vaccine unless there is a contraindication to the vaccine or there is laboratory evidence of immunity to each of the three diseases. A routine second dose of MMR vaccine should be obtained at least 28 days after the first dose for students attending postsecondary schools, health care  workers, or international travelers. People who received inactivated measles vaccine or an unknown type of measles vaccine during 1963-1967 should receive 2 doses of MMR vaccine. People who received inactivated mumps vaccine or an unknown type of mumps vaccine before 1979 and are at high risk for mumps infection should consider immunization with 2 doses of MMR vaccine. For females of childbearing age, rubella immunity should be determined. If there is no evidence of immunity, females who are not pregnant should be vaccinated. If there is no evidence of immunity, females who are pregnant should delay immunization until after pregnancy. Unvaccinated health care workers born before [birth year] who lack laboratory evidence of measles, mumps, or rubella immunity or laboratory confirmation of disease should consider measles and mumps immunization with 2 doses of MMR vaccine or rubella immunization with 1 dose of MMR vaccine.  Pneumococcal 13-valent conjugate (PCV13) vaccine. When indicated, a person who is uncertain of her immunization history and has no record of immunization should receive the PCV13 vaccine. An adult aged 37 years or older who has certain medical conditions and has not been previously immunized should receive 1 dose of PCV13 vaccine. This PCV13 should be followed with a dose of pneumococcal polysaccharide (PPSV23) vaccine. The PPSV23 vaccine dose should be obtained at least 8 weeks after the dose of PCV13 vaccine. An adult aged 31 years or older who has certain medical conditions and previously received 1 or more doses of PPSV23 vaccine should receive 1 dose of PCV13. The PCV13 vaccine dose should be obtained 1 or more years after the last PPSV23 vaccine dose.    Pneumococcal polysaccharide (PPSV23) vaccine. When PCV13 is also indicated, PCV13 should be obtained first. All adults aged 70 years and older should be immunized. An adult younger than age 73 years who has certain medical conditions should be  immunized. Any person who resides in a nursing home or long-term care facility should be immunized. An adult smoker should be immunized. People with an immunocompromised condition and certain other conditions should receive both PCV13 and PPSV23 vaccines. People with human immunodeficiency virus (HIV) infection should be immunized as soon as possible after diagnosis. Immunization during chemotherapy or radiation therapy should be avoided. Routine use of PPSV23 vaccine is not recommended for American Indians, [place] Natives, or people younger than 65 years unless there are medical conditions that require PPSV23 vaccine. When indicated, people who have unknown immunization  and have no record of immunization should receive PPSV23 vaccine. One-time revaccination 5 years after the first dose of PPSV23 is recommended for people aged 19-64 years who have chronic kidney failure, nephrotic syndrome, asplenia, or immunocompromised conditions. People who received 1-2 doses of PPSV23 before age 64 years should receive another dose of PPSV23 vaccine at age 76 years or later if at least 5 years have passed since the previous dose. Doses of PPSV23 are not needed for people immunized with PPSV23 at or after age 65 years.  Preventive Services / Frequency   Ages 64 to 59 years  Blood pressure check.  Lipid and cholesterol check.  Lung cancer screening. / Every year if you are aged 46-80 years and have a 30-pack-year history of smoking and currently smoke or have quit within the past 15 years. Yearly screening is stopped once you have quit smoking for at least 15 years or develop a health problem that would prevent you from having lung cancer treatment.  Clinical breast exam.** / Every year after age 21 years.  BRCA-related cancer risk assessment.** / For women who have family members with a BRCA-related cancer (breast, ovarian, tubal, or peritoneal cancers).  Mammogram.** / Every year beginning at age 33 years and  continuing for as long as you are in good health. Consult with your health care provider.  Pap test.** / Every 3 years starting at age 69 years through age 75 or 60 years with a history of 3 consecutive normal Pap tests.  HPV screening.** / Every 3 years from ages 54 years through ages 72 to 52 years with a history of 3 consecutive normal Pap tests.  Fecal occult blood test (FOBT) of stool. / Every year beginning at age 24 years and continuing until age 63 years. You may not need to do this test if you get a colonoscopy every 10 years.  Flexible sigmoidoscopy or colonoscopy.** / Every 5 years for a flexible sigmoidoscopy or every 10 years for a colonoscopy beginning at age 30 years and continuing until age 38 years.  Hepatitis C blood test.** / For all people born from [birth year] through 1963 and any individual with known risks for hepatitis C.  Skin self-exam. / Monthly.  Influenza vaccine. / Every year.  Tetanus, diphtheria, and acellular pertussis (Tdap/Td) vaccine.** / Consult your health care provider. Pregnant women should receive 1 dose of Tdap vaccine during each pregnancy. 1 dose of Td every 10 years.  Varicella vaccine.** / Consult your health care provider. Pregnant females who do not have evidence of immunity should receive the first dose after pregnancy.  Zoster vaccine.** / 1 dose for adults aged 52 years or older.  Pneumococcal 13-valent conjugate (PCV13) vaccine.** / Consult your health care provider.  Pneumococcal polysaccharide (PPSV23) vaccine.** / 1 to 2 doses if you smoke cigarettes or if you have certain conditions.  Meningococcal vaccine.** / Consult your health care provider.  Hepatitis A vaccine.** / Consult your health care provider.  Hepatitis B vaccine.** / Consult your health care provider. Screening for abdominal aortic aneurysm (AAA)  by ultrasound is recommended for people over 50 who have history of high blood pressure or who are current or former  smokers.

## 2015-12-10 LAB — URINALYSIS, ROUTINE W REFLEX MICROSCOPIC
Bilirubin Urine: NEGATIVE
Glucose, UA: NEGATIVE
Hgb urine dipstick: NEGATIVE
Ketones, ur: NEGATIVE
LEUKOCYTES UA: NEGATIVE
NITRITE: NEGATIVE
PH: 6.5 (ref 5.0–8.0)
Protein, ur: NEGATIVE
SPECIFIC GRAVITY, URINE: 1.014 (ref 1.001–1.035)

## 2015-12-10 LAB — BASIC METABOLIC PANEL WITH GFR
BUN: 12 mg/dL (ref 7–25)
CO2: 29 mmol/L (ref 20–31)
Calcium: 9.2 mg/dL (ref 8.6–10.2)
Chloride: 102 mmol/L (ref 98–110)
Creat: 0.51 mg/dL (ref 0.50–1.10)
GLUCOSE: 85 mg/dL (ref 65–99)
POTASSIUM: 4.2 mmol/L (ref 3.5–5.3)
SODIUM: 141 mmol/L (ref 135–146)

## 2015-12-10 LAB — HEPATIC FUNCTION PANEL
ALK PHOS: 44 U/L (ref 33–115)
ALT: 23 U/L (ref 6–29)
AST: 20 U/L (ref 10–30)
Albumin: 4 g/dL (ref 3.6–5.1)
BILIRUBIN DIRECT: 0.1 mg/dL (ref <=0.2)
BILIRUBIN INDIRECT: 0.3 mg/dL (ref 0.2–1.2)
TOTAL PROTEIN: 6.6 g/dL (ref 6.1–8.1)
Total Bilirubin: 0.4 mg/dL (ref 0.2–1.2)

## 2015-12-10 LAB — FERRITIN: Ferritin: 40 ng/mL (ref 10–232)

## 2015-12-10 LAB — LIPID PANEL
CHOL/HDL RATIO: 2.7 ratio (ref <=5.0)
Cholesterol: 199 mg/dL (ref 125–200)
HDL: 74 mg/dL (ref >=46)
LDL CALC: 108 mg/dL (ref <130)
TRIGLYCERIDES: 84 mg/dL (ref <150)
VLDL: 17 mg/dL (ref <30)

## 2015-12-10 LAB — VITAMIN B12: Vitamin B-12: 621 pg/mL (ref 200–1100)

## 2015-12-10 LAB — IRON AND TIBC
%SAT: 13 % (ref 11–50)
Iron: 58 ug/dL (ref 40–190)
TIBC: 433 ug/dL (ref 250–450)
UIBC: 375 ug/dL (ref 125–400)

## 2015-12-10 LAB — MICROALBUMIN / CREATININE URINE RATIO
Creatinine, Urine: 73 mg/dL (ref 20–320)
MICROALB UR: 0.6 mg/dL
MICROALB/CREAT RATIO: 8 ug/mg{creat} (ref <30)

## 2015-12-10 LAB — MAGNESIUM: MAGNESIUM: 2 mg/dL (ref 1.5–2.5)

## 2015-12-10 LAB — TSH: TSH: 1.81 mIU/L

## 2015-12-10 LAB — HEMOGLOBIN A1C
Hgb A1c MFr Bld: 5.2 % (ref <5.7)
Mean Plasma Glucose: 103 mg/dL (ref <117)

## 2015-12-10 LAB — VITAMIN D 25 HYDROXY (VIT D DEFICIENCY, FRACTURES): Vit D, 25-Hydroxy: 66 ng/mL (ref 30–100)

## 2015-12-11 LAB — IODINE, SERUM/PLASMA: Iodine: 66 mcg/L (ref 52–109)

## 2015-12-26 ENCOUNTER — Encounter: Payer: Self-pay | Admitting: Physician Assistant

## 2016-01-09 ENCOUNTER — Encounter: Payer: Self-pay | Admitting: Physician Assistant

## 2016-01-28 ENCOUNTER — Encounter: Payer: Self-pay | Admitting: Physician Assistant

## 2016-03-30 ENCOUNTER — Other Ambulatory Visit: Payer: Self-pay | Admitting: Internal Medicine

## 2016-03-30 ENCOUNTER — Encounter: Payer: Self-pay | Admitting: Physician Assistant

## 2016-03-30 MED ORDER — ONDANSETRON 8 MG PO TBDP: ORAL_TABLET | ORAL | Status: DC

## 2016-03-30 MED ORDER — HYOSCYAMINE SULFATE 0.125 MG SL SUBL
SUBLINGUAL_TABLET | SUBLINGUAL | Status: DC
Start: 2016-03-30 — End: 2017-10-27

## 2016-10-02 ENCOUNTER — Encounter: Payer: Self-pay | Admitting: *Deleted

## 2016-12-01 ENCOUNTER — Ambulatory Visit (INDEPENDENT_AMBULATORY_CARE_PROVIDER_SITE_OTHER): Payer: BC Managed Care – PPO | Admitting: Physician Assistant

## 2016-12-01 ENCOUNTER — Encounter: Payer: Self-pay | Admitting: Physician Assistant

## 2016-12-01 VITALS — BP 138/80 | HR 86 | Temp 97.7°F | Resp 14 | Ht 66.5 in | Wt 172.6 lb

## 2016-12-01 DIAGNOSIS — N926 Irregular menstruation, unspecified: Secondary | ICD-10-CM

## 2016-12-01 DIAGNOSIS — J0100 Acute maxillary sinusitis, unspecified: Secondary | ICD-10-CM

## 2016-12-01 LAB — POCT URINE PREGNANCY: PREG TEST UR: NEGATIVE

## 2016-12-01 NOTE — Patient Instructions (Addendum)

## 2016-12-01 NOTE — Progress Notes (Signed)
   Subjective:    Patient ID: Hannah Woodard, female    DOB: 1970/11/23, 46 y.o.   MRN: [id number]  HPI 46 y.o. WF presents with being sick.  She stopped BCP Nov 10th, menses has been regular every month but just spotting. Not using protection.  She has felt cough, fever, body aches, nasal congestion x Friday (5 days), getting progressively worse, on nyquil/dayquil.   Blood pressure 138/80, pulse 86, temperature 97.7 F (36.5 C), resp. rate 14, height 5' 6.5" (1.689 m), weight 172 lb 9.6 oz (78.3 kg), last menstrual period 11/02/2016, SpO2 97 %.  Medications Current Outpatient Prescriptions on File Prior to Visit  Medication Sig  . aspirin 81 MG tablet Take 81 mg by mouth daily.  Marland Kitchen b complex vitamins tablet Take 1 tablet by mouth daily.  . Cholecalciferol (VITAMIN D) 2000 UNITS tablet Take 10,000 Units by mouth daily.   . fish oil-omega-3 fatty acids 1000 MG capsule Take 1 g by mouth daily.  Nyoka Cowden Tea 250 MG CAPS Take by mouth 2 (two) times daily.  . hyoscyamine (LEVSIN SL) 0.125 MG SL tablet Take 1 to 2 tablets 3 to 4 x day if needed for Nausea, vomiting, cramping or diarrhea  . Magnesium Ascorbate POWD by Does not apply route Nightly. PRN  . Multiple Vitamins-Minerals (ZINC PO) Take by mouth.  Marland Kitchen NIACINAMIDE-ZINC-COPPER-FA PO Take by mouth.  . ondansetron (ZOFRAN ODT) 8 MG disintegrating tablet 4m ODT q6 hours prn nausea  . Probiotic Product (PROBIOTIC DAILY PO) Take 1 capsule by mouth daily.  . vitamin C (ASCORBIC ACID) 500 MG tablet Take 1,000 mg by mouth daily.   No current facility-administered medications on file prior to visit.     Problem list She has Vitamin D deficiency; Medication management; and Dense breast tissue on her problem list.  Review of Systems  Constitutional: Negative for chills, diaphoresis and fever.  HENT: Positive for congestion and sore throat.   Eyes: Negative.   Respiratory: Positive for cough. Negative for shortness of breath.    Cardiovascular: Negative.   Gastrointestinal: Negative.   Genitourinary: Negative.        Objective:   Physical Exam  Constitutional: She appears well-developed and well-nourished.  HENT:  Head: Normocephalic and atraumatic.  Right Ear: External ear normal.  Nose: Right sinus exhibits maxillary sinus tenderness. Right sinus exhibits no frontal sinus tenderness. Left sinus exhibits maxillary sinus tenderness. Left sinus exhibits no frontal sinus tenderness.  Eyes: Conjunctivae and EOM are normal.  Neck: Normal range of motion. Neck supple.  Cardiovascular: Normal rate, regular rhythm, normal heart sounds and intact distal pulses.   Pulmonary/Chest: Effort normal and breath sounds normal. No respiratory distress. She has no wheezes.  Abdominal: Soft. Bowel sounds are normal.  Skin: Skin is warm and dry.       Assessment & Plan:  1. Acute maxillary sinusitis, recurrence not specified If not better will message for ABX, will treat with OTC  2. Abnormal menses Will check FSH at CPE - POCT urine pregnancy

## 2016-12-02 ENCOUNTER — Other Ambulatory Visit: Payer: Self-pay | Admitting: Physician Assistant

## 2016-12-02 MED ORDER — IODINE STRONG 5 % PO SOLN: 0.2000 mL | Freq: Three times a day (TID) | ORAL | 0 refills | Status: DC

## 2016-12-02 NOTE — Progress Notes (Signed)
On iodine

## 2016-12-16 ENCOUNTER — Ambulatory Visit (INDEPENDENT_AMBULATORY_CARE_PROVIDER_SITE_OTHER): Payer: BC Managed Care – PPO | Admitting: Physician Assistant

## 2016-12-16 ENCOUNTER — Encounter: Payer: Self-pay | Admitting: Physician Assistant

## 2016-12-16 VITALS — BP 110/76 | HR 93 | Temp 97.3°F | Resp 16 | Ht 66.0 in | Wt 173.0 lb

## 2016-12-16 DIAGNOSIS — R6889 Other general symptoms and signs: Secondary | ICD-10-CM | POA: Diagnosis not present

## 2016-12-16 DIAGNOSIS — Z0001 Encounter for general adult medical examination with abnormal findings: Secondary | ICD-10-CM

## 2016-12-16 DIAGNOSIS — Z13220 Encounter for screening for lipoid disorders: Secondary | ICD-10-CM | POA: Diagnosis not present

## 2016-12-16 DIAGNOSIS — N938 Other specified abnormal uterine and vaginal bleeding: Secondary | ICD-10-CM

## 2016-12-16 DIAGNOSIS — E559 Vitamin D deficiency, unspecified: Secondary | ICD-10-CM

## 2016-12-16 DIAGNOSIS — Z1389 Encounter for screening for other disorder: Secondary | ICD-10-CM

## 2016-12-16 DIAGNOSIS — Z131 Encounter for screening for diabetes mellitus: Secondary | ICD-10-CM

## 2016-12-16 DIAGNOSIS — R922 Inconclusive mammogram: Secondary | ICD-10-CM | POA: Diagnosis not present

## 2016-12-16 DIAGNOSIS — Z0000 Encounter for general adult medical examination without abnormal findings: Secondary | ICD-10-CM

## 2016-12-16 DIAGNOSIS — Z79899 Other long term (current) drug therapy: Secondary | ICD-10-CM

## 2016-12-16 DIAGNOSIS — D649 Anemia, unspecified: Secondary | ICD-10-CM

## 2016-12-16 LAB — BASIC METABOLIC PANEL WITHOUT GFR
BUN: 11 mg/dL (ref 7–25)
CO2: 28 mmol/L (ref 20–31)
Calcium: 9.6 mg/dL (ref 8.6–10.2)
Chloride: 101 mmol/L (ref 98–110)
Creat: 0.76 mg/dL (ref 0.50–1.10)
GFR, Est African American: >89 mL/min
GFR, Est Non African American: >89 mL/min
Glucose, Bld: 93 mg/dL (ref 65–99)
Potassium: 4.4 mmol/L (ref 3.5–5.3)
Sodium: 143 mmol/L (ref 135–146)

## 2016-12-16 LAB — CBC WITH DIFFERENTIAL/PLATELET
Basophils Absolute: 0 {cells}/uL (ref 0–200)
Basophils Relative: 0 %
Eosinophils Absolute: 165 {cells}/uL (ref 15–500)
Eosinophils Relative: 3 %
HCT: 39.6 % (ref 35.0–45.0)
Hemoglobin: 12.9 g/dL (ref 11.7–15.5)
Lymphocytes Relative: 27 %
Lymphs Abs: 1485 {cells}/uL (ref 850–3900)
MCH: 32.6 pg (ref 27.0–33.0)
MCHC: 32.6 g/dL (ref 32.0–36.0)
MCV: 100 fL (ref 80.0–100.0)
MPV: 9 fL (ref 7.5–12.5)
Monocytes Absolute: 550 {cells}/uL (ref 200–950)
Monocytes Relative: 10 %
Neutro Abs: 3300 {cells}/uL (ref 1500–7800)
Neutrophils Relative %: 60 %
Platelets: 336 K/uL (ref 140–400)
RBC: 3.96 MIL/uL (ref 3.80–5.10)
RDW: 13.3 % (ref 11.0–15.0)
WBC: 5.5 K/uL (ref 3.8–10.8)

## 2016-12-16 LAB — HEPATIC FUNCTION PANEL
ALK PHOS: 61 U/L (ref 33–115)
ALT: 48 U/L — AB (ref 6–29)
AST: 35 U/L — AB (ref 10–30)
Albumin: 4.4 g/dL (ref 3.6–5.1)
BILIRUBIN INDIRECT: 0.5 mg/dL (ref 0.2–1.2)
Bilirubin, Direct: 0.1 mg/dL (ref <=0.2)
TOTAL PROTEIN: 6.8 g/dL (ref 6.1–8.1)
Total Bilirubin: 0.6 mg/dL (ref 0.2–1.2)

## 2016-12-16 LAB — LIPID PANEL
Cholesterol: 150 mg/dL (ref <200)
HDL: 53 mg/dL (ref >50)
LDL Cholesterol: 79 mg/dL (ref <100)
Total CHOL/HDL Ratio: 2.8 Ratio (ref <5.0)
Triglycerides: 88 mg/dL (ref <150)
VLDL: 18 mg/dL (ref <30)

## 2016-12-16 LAB — IRON AND TIBC
%SAT: 36 % (ref 11–50)
IRON: 128 ug/dL (ref 40–190)
TIBC: 355 ug/dL (ref 250–450)
UIBC: 227 ug/dL (ref 125–400)

## 2016-12-16 LAB — TSH: TSH: 2.31 m[IU]/L

## 2016-12-16 LAB — FERRITIN: Ferritin: 67 ng/mL (ref 10–232)

## 2016-12-16 LAB — VITAMIN B12: Vitamin B-12: 1203 pg/mL — ABNORMAL HIGH (ref 200–1100)

## 2016-12-16 NOTE — Patient Instructions (Addendum)
Add on zantac 317m once or twice a day for 2 weeks Or can do prilosec 276mfor 1-2 weeks.    Nausea, Adult Nausea is the feeling of an upset stomach or having to vomit. Nausea on its own is not usually a serious concern, but it may be an early sign of a more serious medical problem. As nausea gets worse, it can lead to vomiting. If vomiting develops, or if you are not able to drink enough fluids, you are at risk of becoming dehydrated. Dehydration can make you tired and thirsty, cause you to have a dry mouth, and decrease how often you urinate. Older adults and people with other diseases or a weak immune system are at higher risk for dehydration. The main goals of treating your nausea are:  To limit repeated nausea episodes.  To prevent vomiting and dehydration. Follow these instructions at home: Follow instructions from your health care provider about how to care for yourself at home. Eating and drinking  Follow these recommendations as told by your health care provider:  Take an oral rehydration solution (ORS). This is a drink that is sold at pharmacies and retail stores.  Drink clear fluids in small amounts as you are able. Clear fluids include water, ice chips, diluted fruit juice, and low-calorie sports drinks.  Eat bland, easy-to-digest foods in small amounts as you are able. These foods include bananas, applesauce, rice, lean meats, toast, and crackers.  Avoid drinking fluids that contain a lot of sugar or caffeine, such as energy drinks, sports drinks, and soda.  Avoid alcohol.  Avoid spicy or fatty foods. General instructions   Drink enough fluid to keep your urine clear or pale yellow.  Wash your hands often. If soap and water are not available, use hand sanitizer.  Make sure that all people in your household wash their hands well and often.  Rest at home while you recover.  Take over-the-counter and prescription medicines only as told by your health care  provider.  Breathe slowly and deeply when you feel nauseous.  Watch your condition for any changes.  Keep all follow-up visits as told by your health care provider. This is important. Contact a health care provider if:  You have a headache.  You have new symptoms.  Your nausea gets worse.  You have a fever.  You feel light-headed or dizzy.  You vomit.  You cannot keep fluids down. Get help right away if:  You have pain in your chest, neck, arm, or jaw.  You feel extremely weak or you faint.  You have vomit that is bright red or looks like coffee grounds.  You have bloody or black stools or stools that look like tar.  You have a severe headache, a stiff neck, or both.  You have severe pain, cramping, or bloating in your abdomen.  You have a rash.  You have difficulty breathing or are breathing very quickly.  Your heart is beating very quickly.  Your skin feels cold and clammy.  You feel confused.  You have pain when you urinate.  You have signs of dehydration, such as:  Dark urine, very little, or no urine.  Cracked lips.  Dry mouth.  Sunken eyes.  Sleepiness.  Weakness. These symptoms may represent a serious problem that is an emergency. Do not wait to see if the symptoms will go away. Get medical help right away. Call your local emergency services (911 in the [place] Do not drive yourself to the hospital. This  information is not intended to replace advice given to you by your health care provider. Make sure you discuss any questions you have with your health care provider. Document Released: 10/22/2004 Document Revised: 02/17/2016 Document Reviewed: 05/21/2015 Elsevier Interactive Patient Education  2017 [employer]  Your ears and sinuses are connected by the eustachian tube. When your sinuses are inflamed, this can close off the tube and cause fluid to collect in your middle ear. This can then cause dizziness, popping, clicking, ringing, and  echoing in your ears. This is often NOT an infection and does NOT require antibiotics, it is caused by inflammation so the treatments help the inflammation. This can take a long time to get better so please be patient.  Here are things you can do to help with this: - Try the Flonase or Nasonex. Remember to spray each nostril twice towards the outer part of your eye.  Do not sniff but instead pinch your nose and tilt your head back to help the medicine get into your sinuses.  The best time to do this is at bedtime.Stop if you get blurred vision or nose bleeds.  -While drinking fluids, pinch and hold nose close and swallow, to help open eustachian tubes to drain fluid behind ear drums. -Please pick one of the over the counter allergy medications below and take it once daily for allergies.  It will also help with fluid behind ear drums. Claritin or loratadine cheapest but likely the weakest  Zyrtec or certizine at night because it can make you sleepy The strongest is allegra or fexafinadine  Cheapest at [employer], [employer], [employer] -can use decongestant over the counter, please do not use if you have high blood pressure or certain heart conditions.   if worsening HA, changes vision/speech, imbalance, weakness go to the ER    Dysfunctional Uterine Bleeding Dysfunctional uterine bleeding is abnormal bleeding from the uterus. Dysfunctional uterine bleeding includes:  A period that comes earlier or later than usual.  A period that is lighter, heavier, or has blood clots.  Bleeding between periods.  Skipping one or more periods.  Bleeding after sexual intercourse.  Bleeding after menopause. Follow these instructions at home: Pay attention to any changes in your symptoms. Follow these instructions to help with your condition: Eating and drinking   Eat well-balanced meals. Include foods that are high in iron, such as liver, meat, shellfish, green leafy vegetables, and eggs.  If you become  constipated:  Drink plenty of water.  Eat fruits and vegetables that are high in water and fiber, such as spinach, carrots, raspberries, apples, and mango. Medicines   Take over-the-counter and prescription medicines only as told by your health care provider.  Do not change medicines without talking with your health care provider.  Aspirin or medicines that contain aspirin may make the bleeding worse. Do not take those medicines:  During the week before your period.  During your period.  If you were prescribed iron pills, take them as told by your health care provider. Iron pills help to replace iron that your body loses because of this condition. Activity   If you need to change your sanitary pad or tampon more than one time every 2 hours:  Lie in bed with your feet raised (elevated).  Place a cold pack on your lower abdomen.  Rest as much as possible until the bleeding stops or slows down.  Do not try to lose weight until the bleeding has stopped and your blood iron level  is back to normal. Other Instructions   For two months, write down:  When your period starts.  When your period ends.  When any abnormal bleeding occurs.  What problems you notice.  Keep all follow up visits as told by your health care provider. This is important. Contact a health care provider if:  You get light-headed or weak.  You have nausea and vomiting.  You cannot eat or drink without vomiting.  You feel dizzy or have diarrhea while you are taking medicines.  You are taking birth control pills or hormones, and you want to change them or stop taking them. Get help right away if:  You develop a fever or chills.  You need to change your sanitary pad or tampon more than one time per hour.  Your bleeding becomes heavier, or your flow contains clots more often.  You develop pain in your abdomen.  You lose consciousness.  You develop a rash. This information is not intended to  replace advice given to you by your health care provider. Make sure you discuss any questions you have with your health care provider. Document Released: 09/11/2000 Document Revised: 02/20/2016 Document Reviewed: 12/10/2014 Elsevier Interactive Patient Education  2017 [employer].

## 2016-12-16 NOTE — Progress Notes (Signed)
Complete Physical  Assessment and Plan: Routine general medical examination at a health care facility  Vitamin D deficiency - VITAMIN D 25 Hydroxy (Vit-D Deficiency, Fractures)  Screening cholesterol level - Lipid panel  Screening for diabetes mellitus - Hemoglobin A1c  Screening for blood or protein in urine - Urinalysis, Routine w reflex microscopic (not at [facility]) - Microalbumin / creatinine urine ratio   Medication management - Iodine, Serum/Plasma - CBC with Differential/Platelet - BASIC METABOLIC PANEL WITH GFR - Hepatic function panel - Magnesium   Dense breast tissue Get MGM in Nov  Dysfunctional uterine bleeding Check labs, if not better will get [place] and refer for fertility testing -     TSH -     FSH  Anemia, unspecified type -     Iron and TIBC -     Ferritin -     Vitamin B12  Discussed med's effects and SE's. Screening labs and tests as requested with regular follow-up as recommended.  HPI 46 y.o. female  presents for a complete physical. Patient was married last year, has been off BCP since Nov 2017. Having menses irregularly, having spotting/frequent periods, having nausea x Saturday and Monday diarrhea, having some burping/GERD, would like [place] testing.  Her blood pressure has been controlled at home, today their BP is BP: 110/76 Patient is on Vitamin D supplement.  Lab Results  Component Value Date   VD25OH 18 12/09/2015  Mom had ovarian cancer at 34, sister had ovaries taken out due to abnormal growth no cancer. Will occ having cramping/bloating, discussed ovarian cancer screening, discussed that screening for that is not recommended but if she has symptoms that are concerning we can get [place].  Patient has noticed she has been having pale gums recently, has not seen dentist recently.  Last cholesterol was normal Lab Results  Component Value Date   CHOL 199 12/09/2015   HDL 74 12/09/2015   LDLCALC 108 12/09/2015   TRIG 84 12/09/2015   CHOLHDL 2.7  12/09/2015  Last A1C was normal Lab Results  Component Value Date   HGBA1C 5.2 12/09/2015   BMI is Body mass index is 27.92 kg/m., she is working on diet and exercise. Wt Readings from Last 3 Encounters:  12/16/16 173 lb (78.5 kg)  12/01/16 172 lb 9.6 oz (78.3 kg)  12/09/15 151 lb 3.2 oz (68.6 kg)    Current Medications:  Current Outpatient Prescriptions on File Prior to Visit  Medication Sig Dispense Refill  . aspirin 81 MG tablet Take 81 mg by mouth daily.    Marland Kitchen b complex vitamins tablet Take 1 tablet by mouth daily.    . Cholecalciferol (VITAMIN D) 2000 UNITS tablet Take 10,000 Units by mouth daily.     . fish oil-omega-3 fatty acids 1000 MG capsule Take 1 g by mouth daily.    Nyoka Cowden Tea 250 MG CAPS Take by mouth 2 (two) times daily.    . hyoscyamine (LEVSIN SL) 0.125 MG SL tablet Take 1 to 2 tablets 3 to 4 x day if needed for Nausea, vomiting, cramping or diarrhea 90 tablet 0  . Iodine Strong, Lugols, (STRONG IODINE) 5 % solution Take 0.2 mLs by mouth 3 (three) times daily. 480 mL 0  . Magnesium Ascorbate POWD by Does not apply route Nightly. PRN    . Multiple Vitamins-Minerals (ZINC PO) Take by mouth.    Marland Kitchen NIACINAMIDE-ZINC-COPPER-FA PO Take by mouth.    . ondansetron (ZOFRAN ODT) 8 MG disintegrating tablet 62m ODT q6 hours  prn nausea 30 tablet 0  . Povidone-Iodine (IODINE SOLUTION EX) Apply topically.    . Probiotic Product (PROBIOTIC DAILY PO) Take 1 capsule by mouth daily.    . vitamin C (ASCORBIC ACID) 500 MG tablet Take 1,000 mg by mouth daily.     No current facility-administered medications on file prior to visit.    Health Maintenance:  Immunization History  Administered Date(s) Administered  . Hepatitis A 10/02/1997  . Hepatitis B 12/04/1997  . Td 10/10/2008   Tetanus: 2010 Pneumovax: N/A Flu vaccine: N/A Zostavax: N/A Pap: 2016, never normal pap, due 2019 MGM: 07/2016  DEXA: N/A Colonoscopy: N/A EGD: N/A  Medical History: No past medical history on  file.  Allergies Allergies  Allergen Reactions  . Milk-Related Compounds Diarrhea    Stomach cramping  . Poison Ivy Extract [Poison Ivy Extract] Hives, Itching and Rash    SURGICAL HISTORY She  has no past surgical history on file. FAMILY HISTORY Her family history includes Cancer in her father and mother; Ulcerative colitis in her sister. SOCIAL HISTORY She  reports that she has never smoked. She has never used smokeless tobacco. She reports that she does not drink alcohol or use drugs.  Review of Systems  Constitutional: Negative.   HENT: Negative.   Eyes: Negative.   Respiratory: Negative.   Cardiovascular: Negative.   Gastrointestinal: Negative.   Genitourinary: Negative.   Musculoskeletal: Positive for back pain. Negative for falls, joint pain, myalgias and neck pain.  Skin: Negative.   Neurological: Negative.   Endo/Heme/Allergies: Negative.   Psychiatric/Behavioral: Negative.      Physical Exam: Estimated body mass index is 27.92 kg/m as calculated from the following:   Height as of this encounter: 5' 6"  (1.676 m).   Weight as of this encounter: 173 lb (78.5 kg). Vitals:   12/16/16 1354  BP: 110/76  Pulse: 93  Resp: 16  Temp: 97.3 F (36.3 C)   General Appearance: Well nourished, in no apparent distress. Eyes: PERRLA, EOMs, conjunctiva no swelling or erythema, normal fundi and vessels. Sinuses: No Frontal/maxillary tenderness ENT/Mouth: Ext aud canals clear, normal light reflex with TMs without erythema, bulging.  Good dentition. No erythema, swelling, or exudate on post pharynx. Tonsils not swollen or erythematous. Hearing normal.  Neck: Supple, thyroid normal. No bruits Respiratory: Respiratory effort normal, BS equal bilaterally without rales, rhonchi, wheezing or stridor. Cardio: RRR without murmurs, rubs or gallops. Brisk peripheral pulses without edema.  Chest: symmetric, with normal excursions and percussion. Breasts: Symmetric, very dense breast,  without nipple discharge, retractions. Abdomen: Soft, +BS. Non tender, no guarding, rebound, hernias, masses, or organomegaly. Lymphatics: Non tender without lymphadenopathy.  Genitourinary: defer Musculoskeletal: Full ROM all peripheral extremities,5/5 strength, and normal gait. Skin: Warm, dry without rashes, lesions, ecchymosis.  Neuro: Cranial nerves intact, reflexes equal bilaterally. Normal muscle tone, no cerebellar symptoms. Sensation intact.  Psych: Awake and oriented X 3, normal affect, Insight and Judgment appropriate.   EKG: defer  Vicie Mutters 2:09 PM

## 2016-12-17 ENCOUNTER — Encounter: Payer: Self-pay | Admitting: Physician Assistant

## 2016-12-17 ENCOUNTER — Other Ambulatory Visit: Payer: Self-pay | Admitting: Physician Assistant

## 2016-12-17 DIAGNOSIS — R748 Abnormal levels of other serum enzymes: Secondary | ICD-10-CM

## 2016-12-17 LAB — URINALYSIS, ROUTINE W REFLEX MICROSCOPIC
Bilirubin Urine: NEGATIVE
GLUCOSE, UA: NEGATIVE
Hgb urine dipstick: NEGATIVE
Ketones, ur: NEGATIVE
LEUKOCYTES UA: NEGATIVE
Nitrite: NEGATIVE
PH: 7.5 (ref 5.0–8.0)
PROTEIN: NEGATIVE
Specific Gravity, Urine: 1.009 (ref 1.001–1.035)

## 2016-12-17 LAB — VITAMIN D 25 HYDROXY (VIT D DEFICIENCY, FRACTURES): VIT D 25 HYDROXY: 53 ng/mL (ref 30–100)

## 2016-12-17 LAB — HEMOGLOBIN A1C
HEMOGLOBIN A1C: 4.6 % (ref <5.7)
Mean Plasma Glucose: 85 mg/dL

## 2016-12-17 LAB — MICROALBUMIN / CREATININE URINE RATIO: Creatinine, Urine: 40 mg/dL (ref 20–320)

## 2016-12-17 LAB — FOLLICLE STIMULATING HORMONE: FSH: 18 m[IU]/mL

## 2016-12-17 LAB — MAGNESIUM: Magnesium: 2.1 mg/dL (ref 1.5–2.5)

## 2017-01-20 ENCOUNTER — Other Ambulatory Visit: Payer: BC Managed Care – PPO

## 2017-01-20 ENCOUNTER — Other Ambulatory Visit: Payer: Self-pay | Admitting: Physician Assistant

## 2017-01-20 DIAGNOSIS — R748 Abnormal levels of other serum enzymes: Secondary | ICD-10-CM

## 2017-01-20 DIAGNOSIS — N926 Irregular menstruation, unspecified: Secondary | ICD-10-CM | POA: Diagnosis not present

## 2017-01-20 LAB — HEPATIC FUNCTION PANEL
ALT: 32 U/L — AB (ref 6–29)
AST: 23 U/L (ref 10–30)
Albumin: 4.2 g/dL (ref 3.6–5.1)
Alkaline Phosphatase: 56 U/L (ref 33–115)
BILIRUBIN DIRECT: 0.1 mg/dL (ref <=0.2)
BILIRUBIN TOTAL: 0.7 mg/dL (ref 0.2–1.2)
Indirect Bilirubin: 0.6 mg/dL (ref 0.2–1.2)
Total Protein: 6.6 g/dL (ref 6.1–8.1)

## 2017-01-21 ENCOUNTER — Encounter: Payer: Self-pay | Admitting: Physician Assistant

## 2017-01-21 NOTE — Progress Notes (Signed)
Recheck liver function

## 2017-01-22 LAB — FOLLICLE STIMULATING HORMONE: FSH: 2.7 m[IU]/mL

## 2017-02-18 ENCOUNTER — Encounter: Payer: Self-pay | Admitting: Internal Medicine

## 2017-05-06 ENCOUNTER — Emergency Department (HOSPITAL_COMMUNITY)
Admission: EM | Admit: 2017-05-06 | Discharge: 2017-05-06 | Disposition: A | Discharge status: Home / Self Care | Payer: BC Managed Care – PPO | Attending: Emergency Medicine | Admitting: Emergency Medicine

## 2017-05-06 ENCOUNTER — Encounter (HOSPITAL_COMMUNITY): Payer: Self-pay | Admitting: Emergency Medicine

## 2017-05-06 ENCOUNTER — Emergency Department (HOSPITAL_COMMUNITY): Payer: BC Managed Care – PPO

## 2017-05-06 DIAGNOSIS — R1033 Periumbilical pain: Secondary | ICD-10-CM | POA: Insufficient documentation

## 2017-05-06 DIAGNOSIS — Z79899 Other long term (current) drug therapy: Secondary | ICD-10-CM | POA: Diagnosis not present

## 2017-05-06 DIAGNOSIS — R102 Pelvic and perineal pain: Secondary | ICD-10-CM

## 2017-05-06 DIAGNOSIS — N3000 Acute cystitis without hematuria: Secondary | ICD-10-CM | POA: Insufficient documentation

## 2017-05-06 DIAGNOSIS — Z7982 Long term (current) use of aspirin: Secondary | ICD-10-CM | POA: Diagnosis not present

## 2017-05-06 LAB — CBC
HCT: 39 % (ref 36.0–46.0)
HEMOGLOBIN: 13.8 g/dL (ref 12.0–15.0)
MCH: 34.2 pg — ABNORMAL HIGH (ref 26.0–34.0)
MCHC: 35.4 g/dL (ref 30.0–36.0)
MCV: 96.5 fL (ref 78.0–100.0)
Platelets: 275 10*3/uL (ref 150–400)
RBC: 4.04 MIL/uL (ref 3.87–5.11)
RDW: 13.6 % (ref 11.5–15.5)
WBC: 8.3 10*3/uL (ref 4.0–10.5)

## 2017-05-06 LAB — COMPREHENSIVE METABOLIC PANEL
ALT: 27 U/L (ref 14–54)
ANION GAP: 9 (ref 5–15)
AST: 25 U/L (ref 15–41)
Albumin: 4.3 g/dL (ref 3.5–5.0)
Alkaline Phosphatase: 53 U/L (ref 38–126)
BILIRUBIN TOTAL: 0.6 mg/dL (ref 0.3–1.2)
BUN: 10 mg/dL (ref 6–20)
CHLORIDE: 104 mmol/L (ref 101–111)
CO2: 28 mmol/L (ref 22–32)
Calcium: 9.9 mg/dL (ref 8.9–10.3)
Creatinine, Ser: 0.56 mg/dL (ref 0.44–1.00)
GFR calc Af Amer: >60 mL/min (ref >60)
Glucose, Bld: 106 mg/dL — ABNORMAL HIGH (ref 65–99)
POTASSIUM: 4.2 mmol/L (ref 3.5–5.1)
Sodium: 141 mmol/L (ref 135–145)
Total Protein: 7.1 g/dL (ref 6.5–8.1)

## 2017-05-06 LAB — PREGNANCY, URINE: PREG TEST UR: NEGATIVE

## 2017-05-06 LAB — URINALYSIS, ROUTINE W REFLEX MICROSCOPIC
BILIRUBIN URINE: NEGATIVE
Glucose, UA: NEGATIVE mg/dL
HGB URINE DIPSTICK: NEGATIVE
Ketones, ur: NEGATIVE mg/dL
NITRITE: NEGATIVE
Protein, ur: NEGATIVE mg/dL
SPECIFIC GRAVITY, URINE: 1.005 (ref 1.005–1.030)
pH: 7 (ref 5.0–8.0)

## 2017-05-06 LAB — LIPASE, BLOOD: LIPASE: 26 U/L (ref 11–51)

## 2017-05-06 LAB — I-STAT BETA HCG BLOOD, ED (MC, WL, AP ONLY): I-stat hCG, quantitative: <5 m[IU]/mL (ref <5)

## 2017-05-06 MED ORDER — CEPHALEXIN 500 MG PO CAPS: 500.0000 mg | ORAL_CAPSULE | Freq: Four times a day (QID) | ORAL | 0 refills | Status: DC

## 2017-05-06 MED ORDER — ONDANSETRON 4 MG PO TBDP
4.0000 mg | ORAL_TABLET | Freq: Once | ORAL | Status: AC | PRN
  Administered 2017-05-06: 4 mg via ORAL
  Filled 2017-05-06: qty 1

## 2017-05-06 MED ORDER — PHENAZOPYRIDINE HCL 200 MG PO TABS: 200.0000 mg | ORAL_TABLET | Freq: Three times a day (TID) | ORAL | 0 refills | Status: DC

## 2017-05-06 MED ORDER — KETOROLAC TROMETHAMINE 30 MG/ML IJ SOLN
30.0000 mg | Freq: Once | INTRAMUSCULAR | Status: AC
  Administered 2017-05-06: 30 mg via INTRAVENOUS
  Filled 2017-05-06: qty 1

## 2017-05-06 NOTE — ED Provider Notes (Signed)
[facility] DEPT Provider Note   CSN: [id number] Arrival date & time: 05/06/17  0436     History   Chief Complaint Chief Complaint  Patient presents with  . Abdominal Pain    HPI Hannah Woodard is a 46 y.o. female.  Patient is a 46 year old female with no significant past medical history. She presents for evaluation of abdominal pain. She reports waking up from sleep to walk her dog. She states she urinated, then developed pain in the suprapubic region. She reports the pain comes and goes in spasms. She denies any dysuria or hematuria. She denies any fevers or chills.   The history is provided by the patient.  Abdominal Pain   This is a new problem. The current episode started 1 to 2 hours ago. Episode frequency: Intermittently. The problem has not changed since onset.The pain is located in the suprapubic region. The pain is severe. Pertinent negatives include fever, melena and constipation. Nothing aggravates the symptoms. Nothing relieves the symptoms.    History reviewed. No pertinent past medical history.  Patient Active Problem List   Diagnosis Date Noted  . Vitamin D deficiency 12/09/2015  . Medication management 12/09/2015  . Dense breast tissue 12/09/2015    History reviewed. No pertinent surgical history.  OB History    No data available       Home Medications    Prior to Admission medications   Medication Sig Start Date End Date Taking? Authorizing Provider  aspirin 81 MG tablet Take 81 mg by mouth daily.    [provider]  b complex vitamins tablet Take 1 tablet by mouth daily.    [provider]  Cholecalciferol (VITAMIN D) 2000 UNITS tablet Take 10,000 Units by mouth daily.     [provider]  fish oil-omega-3 fatty acids 1000 MG capsule Take 1 g by mouth daily.    [provider]  Nyoka Cowden Tea 250 MG CAPS Take by mouth 2 (two) times daily.    [provider]  hyoscyamine (LEVSIN SL) 0.125 MG SL tablet  Take 1 to 2 tablets 3 to 4 x day if needed for Nausea, vomiting, cramping or diarrhea 03/30/16   Forcucci, Courtney, PA-C  Iodine Strong, Lugols, (STRONG IODINE) 5 % solution Take 0.2 mLs by mouth 3 (three) times daily. 12/02/16   Vicie Mutters, PA-C  Magnesium Ascorbate POWD by Does not apply route Nightly. PRN    [provider]  Multiple Vitamins-Minerals (ZINC PO) Take by mouth.    [provider]  NIACINAMIDE-ZINC-COPPER-FA PO Take by mouth.    [provider]  ondansetron (ZOFRAN ODT) 8 MG disintegrating tablet 35m ODT q6 hours prn nausea 03/30/16   Forcucci, Courtney, PA-C  Povidone-Iodine (IODINE SOLUTION EX) Apply topically.    [provider]  Probiotic Product (PROBIOTIC DAILY PO) Take 1 capsule by mouth daily.    [provider]  vitamin C (ASCORBIC ACID) 500 MG tablet Take 1,000 mg by mouth daily.    [provider]    Family History Family History  Problem Relation Age of Onset  . Cancer Mother        ovarian  . Cancer Father        prosate  . Ulcerative colitis Sister     Social History Social History  Substance Use Topics  . Smoking status: Never Smoker  . Smokeless tobacco: Never Used  . Alcohol use No     Allergies   Milk-related compounds and Poison ivy extract [  poison ivy extract]   Review of Systems Review of Systems  Constitutional: Negative for fever.  Gastrointestinal: Positive for abdominal pain. Negative for constipation and melena.  All other systems reviewed and are negative.    Physical Exam Updated Vital Signs BP 110/84 (BP Location: Left Arm)   Pulse 75   Temp 98.2 F (36.8 C) (Oral)   Resp 18   Ht 5' 6"  (1.676 m)   Wt 73.5 kg (162 lb)   LMP 04/27/2017   SpO2 100%   BMI 26.15 kg/m   Physical Exam  Constitutional: She is oriented to person, place, and time. She appears well-developed and well-nourished. No distress.  HENT:  Head: Normocephalic and atraumatic.  Neck: Normal range  of motion. Neck supple.  Cardiovascular: Normal rate and regular rhythm.  Exam reveals no gallop and no friction rub.   No murmur heard. Pulmonary/Chest: Effort normal and breath sounds normal. No respiratory distress. She has no wheezes.  Abdominal: Soft. Bowel sounds are normal. She exhibits no distension. There is tenderness. There is no rebound and no guarding.  There is tenderness to palpation in the suprapubic region.  Musculoskeletal: Normal range of motion.  Neurological: She is alert and oriented to person, place, and time.  Skin: Skin is warm and dry. She is not diaphoretic.  Nursing note and vitals reviewed.    ED Treatments / Results  Labs (all labs ordered are listed, but only abnormal results are displayed) Labs Reviewed  LIPASE, BLOOD  COMPREHENSIVE METABOLIC PANEL  CBC  URINALYSIS, ROUTINE W REFLEX MICROSCOPIC  PREGNANCY, URINE  I-STAT BETA HCG BLOOD, ED ([facility], [facility], [facility] ONLY)    EKG  EKG Interpretation None       Radiology No results found.  Procedures Procedures (including critical care time)  Medications Ordered in ED Medications  ketorolac (TORADOL) 30 MG/ML injection 30 mg (not administered)  ondansetron (ZOFRAN-ODT) disintegrating tablet 4 mg (4 mg Oral Given 05/06/17 0454)     Initial Impression / Assessment and Plan / ED Course  I have reviewed the triage vital signs and the nursing notes.  Pertinent labs & imaging results that were available during my care of the patient were reviewed by me and considered in my medical decision making (see chart for details).  Patient presents here with suprapubic cramping that started early this morning. Scan shows no evidence for renal calculus, however urinalysis does suggest a urinary tract infection. She will be treated with Keflex, Pyridium, and is to follow-up as needed for any problems.  Final Clinical Impressions(s) / ED Diagnoses   Final diagnoses:  None    New Prescriptions New Prescriptions    No medications on file     Veryl Speak, MD 05/06/17 [phone]

## 2017-05-06 NOTE — Discharge Instructions (Signed)
Keflex and Pyridium as prescribed.  Return to the emergency department if you develop high fever, worsening pain, or other new and concerning symptoms.

## 2017-05-06 NOTE — ED Triage Notes (Signed)
Patient woke up and went to go pee. Patient states she went to walk the dog and started having lower abdominal pain. Patient went and tried to pee again. Patient states she couldn't. Patient is feeling nauseas.

## 2017-05-16 ENCOUNTER — Encounter: Payer: Self-pay | Admitting: Physician Assistant

## 2017-05-18 ENCOUNTER — Encounter: Payer: Self-pay | Admitting: Physician Assistant

## 2017-05-18 ENCOUNTER — Ambulatory Visit (INDEPENDENT_AMBULATORY_CARE_PROVIDER_SITE_OTHER): Payer: BC Managed Care – PPO | Admitting: Physician Assistant

## 2017-05-18 VITALS — Ht 66.0 in | Wt 169.0 lb

## 2017-05-18 DIAGNOSIS — R350 Frequency of micturition: Secondary | ICD-10-CM | POA: Diagnosis not present

## 2017-05-18 DIAGNOSIS — R110 Nausea: Secondary | ICD-10-CM | POA: Diagnosis not present

## 2017-05-18 MED ORDER — CIPROFLOXACIN HCL 500 MG PO TABS: 500.0000 mg | ORAL_TABLET | Freq: Two times a day (BID) | ORAL | 0 refills | Status: DC

## 2017-05-18 NOTE — Progress Notes (Signed)
Subjective:    Patient ID: [facility], female    DOB: 11/25/1970, 46 y.o.   MRN: [id number]  HPI 46 y.o. WF presents with multitude of complaints.  She went to the Ed with AB pain on 08/09, she had neg preg test, normal CBC/CMET, and + UTI treated with keflex, no culture, CT AB was normal.  She continue to have urinary frequency, diarrhea last 2 mornings, feels gasy, AB cramping with nausea. Had some bright red blood on TP after urination with some faint spotting this AM.  Feels she can not empty.   Height 5' 6"  (1.676 m), weight 169 lb (76.7 kg), last menstrual period 04/27/2017. Patient left before vitals were done  Medications Current Outpatient Prescriptions on File Prior to Visit  Medication Sig  . aspirin 81 MG tablet Take 81 mg by mouth daily.  Marland Kitchen b complex vitamins tablet Take 1 tablet by mouth daily.  . Cholecalciferol (VITAMIN D) 2000 UNITS tablet Take 10,000 Units by mouth daily.   . fish oil-omega-3 fatty acids 1000 MG capsule Take 1 g by mouth daily.  Nyoka Cowden Tea 250 MG CAPS Take by mouth 2 (two) times daily.  . hyoscyamine (LEVSIN SL) 0.125 MG SL tablet Take 1 to 2 tablets 3 to 4 x day if needed for Nausea, vomiting, cramping or diarrhea  . Iodine Strong, Lugols, (STRONG IODINE) 5 % solution Take 0.2 mLs by mouth 3 (three) times daily.  . ondansetron (ZOFRAN ODT) 8 MG disintegrating tablet 43m ODT q6 hours prn nausea  . phenazopyridine (PYRIDIUM) 200 MG tablet Take 1 tablet (200 mg total) by mouth 3 (three) times daily.   No current facility-administered medications on file prior to visit.     Problem list She has Vitamin D deficiency; Medication management; and Dense breast tissue on her problem list.   Review of Systems  Constitutional: Negative for chills, diaphoresis, fatigue and fever.  HENT: Negative.   Respiratory: Negative.  Negative for cough.   Cardiovascular: Negative.   Gastrointestinal: Positive for abdominal pain, diarrhea and nausea. Negative  for abdominal distention, anal bleeding, blood in stool, constipation, rectal pain and vomiting.  Genitourinary: Positive for dysuria, frequency and hematuria. Negative for decreased urine volume, difficulty urinating, dyspareunia, enuresis, flank pain, genital sores, menstrual problem, pelvic pain, urgency, vaginal bleeding, vaginal discharge and vaginal pain.  Musculoskeletal: Positive for arthralgias and back pain. Negative for gait problem, joint swelling, myalgias, neck pain and neck stiffness.  Skin: Negative.   Neurological: Positive for dizziness. Negative for headaches.       Objective:   Physical Exam  Constitutional: She is oriented to person, place, and time. She appears well-developed and well-nourished.  Neck: Normal range of motion. Neck supple.  Cardiovascular: Normal rate and regular rhythm.   Pulmonary/Chest: Effort normal and breath sounds normal.  Abdominal: Soft. Bowel sounds are normal. She exhibits no distension and no mass. There is tenderness. There is no rebound and no guarding.  Musculoskeletal: Normal range of motion. She exhibits no tenderness.  Neurological: She is alert and oriented to person, place, and time.  Skin: Skin is warm and dry.      Assessment & Plan:    Urinary frequency -     CBC with Differential/Platelet -     BASIC METABOLIC PANEL WITH GFR -     Hepatic function panel -     Urinalysis, Routine w reflex microscopic -     Urine Culture - -     ciprofloxacin (  CIPRO) 500 MG tablet; Take 1 tablet (500 mg total) by mouth 2 (two) times daily. - negative CT scan 08/07  Nausea -     CBC with Differential/Platelet -     BASIC METABOLIC PANEL WITH GFR -     Hepatic function panel - continue zofran PRN, bland food, add probiotic

## 2017-05-18 NOTE — Patient Instructions (Addendum)
Get on probiotic Cipro twice daily   Urinary Frequency, Adult Urinary frequency means urinating more often than usual. People with urinary frequency urinate at least 8 times in 24 hours, even if they drink a normal amount of fluid. Although they urinate more often than normal, the total amount of urine produced in a day may be normal. Urinary frequency is also called pollakiuria. What are the causes? This condition may be caused by:  A urinary tract infection.  Obesity.  Bladder problems, such as bladder stones.  Caffeine or alcohol.  Eating food or drinking fluids that irritate the bladder. These include coffee, tea, soda, artificial sweeteners, citrus, tomato-based foods, and chocolate.  Certain medicines, such as medicines that help the body get rid of extra fluid (diuretics).  Muscle or nerve weakness.  Overactive bladder.  Chronic diabetes.  Interstitial cystitis.  In men, problems with the prostate, such as an enlarged prostate.  In women, pregnancy.  In some cases, the cause may not be known. What increases the risk? This condition is more likely to develop in:  Women who have gone through menopause.  Men with prostate problems.  People with a disease or injury that affects the nerves or spinal cord.  People who have or have had a condition that affects the brain, such as a stroke.  What are the signs or symptoms? Symptoms of this condition include:  Feeling an urgent need to urinate often. The stress and anxiety of needing to find a bathroom quickly can make this urge worse.  Urinating 8 or more times in 24 hours.  Urinating as often as every 1 to 2 hours.  How is this diagnosed? This condition is diagnosed based on your symptoms, your medical history, and a physical exam. You may have tests, such as:  Blood tests.  Urine tests.  Imaging tests, such as X-rays or ultrasounds.  A bladder test.  A test of your neurological system. This is the body  system that senses the need to urinate.  A test to check for problems in the urethra and bladder called cystoscopy.  You may also be asked to keep a bladder diary. A bladder diary is a record of what you eat and drink, how often you urinate, and how much you urinate. You may need to see a health care provider who specializes in conditions of the urinary tract (urologist) or kidneys (nephrologist). How is this treated? Treatment for this condition depends on the cause. Sometimes the condition goes away on its own and treatment is not necessary. If treatment is needed, it may include:  Taking medicine.  Learning exercises that strengthen the muscles that help control urination.  Following a bladder training program. This may include: ? Learning to delay going to the bathroom. ? Double urinating (voiding). This helps if you are not completely emptying your bladder. ? Scheduled voiding.  Making diet changes, such as: ? Avoiding caffeine. ? Drinking fewer fluids, especially alcohol. ? Not drinking in the evening. ? Not having foods or drinks that may irritate the bladder. ? Eating foods that help prevent or ease constipation. Constipation can make this condition worse.  Having the nerves in your bladder stimulated. There are two options for stimulating the nerves to your bladder: ? Outpatient electrical nerve stimulation. This is done by your health care provider. ? Surgery to implant a bladder pacemaker. The pacemaker helps to control the urge to urinate.  Follow these instructions at home:  Keep a bladder diary if told to  by your health care provider.  Take over-the-counter and prescription medicines only as told by your health care provider.  Do any exercises as told by your health care provider.  Follow a bladder training program as told by your health care provider.  Make any recommended diet changes.  Keep all follow-up visits as told by your health care provider. This is  important. Contact a health care provider if:  You start urinating more often.  You feel pain or irritation when you urinate.  You notice blood in your urine.  Your urine looks cloudy.  You develop a fever.  You begin vomiting. Get help right away if:  You are unable to urinate. This information is not intended to replace advice given to you by your health care provider. Make sure you discuss any questions you have with your health care provider. Document Released: 07/11/2009 Document Revised: 10/16/2015 Document Reviewed: 04/10/2015 Elsevier Interactive Patient Education  2018 [employer].   Diarrhea, Adult Diarrhea is frequent loose and watery bowel movements. Diarrhea can make you feel weak and cause you to become dehydrated. Dehydration can make you tired and thirsty, cause you to have a dry mouth, and decrease how often you urinate. Diarrhea typically lasts 2-3 days. However, it can last longer if it is a sign of something more serious. It is important to treat your diarrhea as told by your health care provider. Follow these instructions at home: Eating and drinking  Follow these recommendations as told by your health care provider:  Take an oral rehydration solution (ORS). This is a drink that is sold at pharmacies and retail stores.  Drink clear fluids, such as water, ice chips, diluted fruit juice, and low-calorie sports drinks.  Eat bland, easy-to-digest foods in small amounts as you are able. These foods include bananas, applesauce, rice, lean meats, toast, and crackers.  Avoid drinking fluids that contain a lot of sugar or caffeine, such as energy drinks, sports drinks, and soda.  Avoid alcohol.  Avoid spicy or fatty foods.  General instructions  Drink enough fluid to keep your urine clear or pale yellow.  Wash your hands often. If soap and water are not available, use hand sanitizer.  Make sure that all people in your household wash their hands well and  often.  Take over-the-counter and prescription medicines only as told by your health care provider.  Rest at home while you recover.  Watch your condition for any changes.  Take a warm bath to relieve any burning or pain from frequent diarrhea episodes.  Keep all follow-up visits as told by your health care provider. This is important. Contact a health care provider if:  You have a fever.  Your diarrhea gets worse.  You have new symptoms.  You cannot keep fluids down.  You feel light-headed or dizzy.  You have a headache  You have muscle cramps. Get help right away if:  You have chest pain.  You feel extremely weak or you faint.  You have bloody or black stools or stools that look like tar.  You have severe pain, cramping, or bloating in your abdomen.  You have trouble breathing or you are breathing very quickly.  Your heart is beating very quickly.  Your skin feels cold and clammy.  You feel confused.  You have signs of dehydration, such as: ? Dark urine, very little urine, or no urine. ? Cracked lips. ? Dry mouth. ? Sunken eyes. ? Sleepiness. ? Weakness. This information is not intended  to replace advice given to you by your health care provider. Make sure you discuss any questions you have with your health care provider. Document Released: 09/04/2002 Document Revised: 01/23/2016 Document Reviewed: 05/21/2015 Elsevier Interactive Patient Education  2017 [employer].

## 2017-05-19 LAB — BASIC METABOLIC PANEL WITH GFR
BUN: 12 mg/dL (ref 7–25)
CALCIUM: 9.2 mg/dL (ref 8.6–10.2)
CHLORIDE: 107 mmol/L (ref 98–110)
CO2: 29 mmol/L (ref 20–32)
Creat: 0.6 mg/dL (ref 0.50–1.10)
GFR, Est African American: 128 mL/min/{1.73_m2} (ref >=60)
GFR, Est Non African American: 111 mL/min/{1.73_m2} (ref >=60)
GLUCOSE: 88 mg/dL (ref 65–99)
POTASSIUM: 4.6 mmol/L (ref 3.5–5.3)
SODIUM: 143 mmol/L (ref 135–146)

## 2017-05-19 LAB — CBC WITH DIFFERENTIAL/PLATELET
BASOS ABS: 29 {cells}/uL (ref 0–200)
Basophils Relative: 0.4 %
EOS ABS: 238 {cells}/uL (ref 15–500)
EOS PCT: 3.3 %
HEMATOCRIT: 39 % (ref 35.0–45.0)
HEMOGLOBIN: 13.1 g/dL (ref 11.7–15.5)
LYMPHS ABS: 1073 {cells}/uL (ref 850–3900)
MCH: 33.3 pg — AB (ref 27.0–33.0)
MCHC: 33.6 g/dL (ref 32.0–36.0)
MCV: 99.2 fL (ref 80.0–100.0)
MPV: 9.6 fL (ref 7.5–12.5)
Monocytes Relative: 7.6 %
NEUTROS ABS: 5314 {cells}/uL (ref 1500–7800)
NEUTROS PCT: 73.8 %
Platelets: 271 10*3/uL (ref 140–400)
RBC: 3.93 10*6/uL (ref 3.80–5.10)
RDW: 12.4 % (ref 11.0–15.0)
Total Lymphocyte: 14.9 %
WBC mixed population: 547 cells/uL (ref 200–950)
WBC: 7.2 10*3/uL (ref 3.8–10.8)

## 2017-05-19 LAB — HEPATIC FUNCTION PANEL
AG Ratio: 1.9 (calc) (ref 1.0–2.5)
ALBUMIN MSPROF: 4.2 g/dL (ref 3.6–5.1)
ALKALINE PHOSPHATASE (APISO): 46 U/L (ref 33–115)
ALT: 17 U/L (ref 6–29)
AST: 19 U/L (ref 10–30)
BILIRUBIN DIRECT: 0.2 mg/dL (ref 0.0–0.2)
Globulin: 2.2 g/dL (calc) (ref 1.9–3.7)
Indirect Bilirubin: 0.6 mg/dL (calc) (ref 0.2–1.2)
Total Bilirubin: 0.8 mg/dL (ref 0.2–1.2)
Total Protein: 6.4 g/dL (ref 6.1–8.1)

## 2017-05-19 LAB — URINALYSIS, ROUTINE W REFLEX MICROSCOPIC
BILIRUBIN URINE: NEGATIVE
Glucose, UA: NEGATIVE
HGB URINE DIPSTICK: NEGATIVE
Ketones, ur: NEGATIVE
LEUKOCYTES UA: NEGATIVE
Nitrite: NEGATIVE
PROTEIN: NEGATIVE
Specific Gravity, Urine: 1.005 (ref 1.001–1.03)
pH: 6.5 (ref 5.0–8.0)

## 2017-05-19 LAB — EXTRA URINE SPECIMEN

## 2017-05-20 ENCOUNTER — Other Ambulatory Visit: Payer: Self-pay | Admitting: Physician Assistant

## 2017-05-20 DIAGNOSIS — R350 Frequency of micturition: Secondary | ICD-10-CM

## 2017-05-20 LAB — URINE CULTURE
MICRO NUMBER:: 80908343
SPECIMEN QUALITY:: ADEQUATE

## 2017-05-27 ENCOUNTER — Encounter: Payer: Self-pay | Admitting: *Deleted

## 2017-06-10 ENCOUNTER — Encounter: Payer: Self-pay | Admitting: Physician Assistant

## 2017-06-16 ENCOUNTER — Other Ambulatory Visit: Payer: BC Managed Care – PPO

## 2017-06-16 DIAGNOSIS — R350 Frequency of micturition: Secondary | ICD-10-CM

## 2017-06-17 LAB — URINALYSIS, ROUTINE W REFLEX MICROSCOPIC
BILIRUBIN URINE: NEGATIVE
Bacteria, UA: NONE SEEN /HPF
Glucose, UA: NEGATIVE
Hyaline Cast: NONE SEEN /LPF
Ketones, ur: NEGATIVE
LEUKOCYTES UA: NEGATIVE
NITRITE: NEGATIVE
PH: 6.5 (ref 5.0–8.0)
Protein, ur: NEGATIVE
SPECIFIC GRAVITY, URINE: 1.02 (ref 1.001–1.03)
Squamous Epithelial / HPF: NONE SEEN /HPF (ref <=5)
WBC UA: NONE SEEN /HPF (ref 0–5)

## 2017-06-17 LAB — URINE CULTURE
MICRO NUMBER:: 81037003
SPECIMEN QUALITY:: ADEQUATE

## 2017-07-29 LAB — HM MAMMOGRAPHY

## 2017-08-18 LAB — HM MAMMOGRAPHY: HM MAMMO: ABNORMAL — AB (ref 0–4)

## 2017-08-23 ENCOUNTER — Encounter: Payer: Self-pay | Admitting: Internal Medicine

## 2017-08-24 ENCOUNTER — Other Ambulatory Visit: Payer: Self-pay | Admitting: Radiology

## 2017-08-24 LAB — HM MAMMOGRAPHY

## 2017-08-25 LAB — HM MAMMOGRAPHY

## 2017-08-26 ENCOUNTER — Encounter: Payer: Self-pay | Admitting: Internal Medicine

## 2017-08-26 ENCOUNTER — Telehealth: Payer: Self-pay | Admitting: Oncology

## 2017-08-26 NOTE — Telephone Encounter (Signed)
Confirmed afternoon appointment with patient for [facility] on 09/01/17, patient is a [facility] patient so no paperwork sent, appointment reminder was mailed

## 2017-08-27 ENCOUNTER — Other Ambulatory Visit: Payer: Self-pay | Admitting: *Deleted

## 2017-08-27 DIAGNOSIS — C50211 Malignant neoplasm of upper-inner quadrant of right female breast: Secondary | ICD-10-CM | POA: Insufficient documentation

## 2017-08-27 DIAGNOSIS — Z170 Estrogen receptor positive status [ER+]: Principal | ICD-10-CM

## 2017-08-30 ENCOUNTER — Encounter: Payer: Self-pay | Admitting: Genetics

## 2017-09-01 ENCOUNTER — Other Ambulatory Visit (HOSPITAL_BASED_OUTPATIENT_CLINIC_OR_DEPARTMENT_OTHER): Payer: BC Managed Care – PPO

## 2017-09-01 ENCOUNTER — Ambulatory Visit: Payer: BC Managed Care – PPO | Attending: General Surgery | Admitting: Physical Therapy

## 2017-09-01 ENCOUNTER — Ambulatory Visit (HOSPITAL_BASED_OUTPATIENT_CLINIC_OR_DEPARTMENT_OTHER): Payer: BC Managed Care – PPO | Admitting: Oncology

## 2017-09-01 ENCOUNTER — Ambulatory Visit
Admission: RE | Admit: 2017-09-01 | Discharge: 2017-09-01 | Disposition: A | Discharge status: Home / Self Care | Payer: BC Managed Care – PPO | Source: Ambulatory Visit | Attending: Radiation Oncology | Admitting: Radiation Oncology

## 2017-09-01 ENCOUNTER — Encounter: Payer: Self-pay | Admitting: Oncology

## 2017-09-01 VITALS — BP 110/56 | HR 76 | Temp 98.0°F | Resp 18 | Ht 66.0 in | Wt 166.1 lb

## 2017-09-01 DIAGNOSIS — C50211 Malignant neoplasm of upper-inner quadrant of right female breast: Secondary | ICD-10-CM | POA: Diagnosis not present

## 2017-09-01 DIAGNOSIS — Z170 Estrogen receptor positive status [ER+]: Secondary | ICD-10-CM | POA: Insufficient documentation

## 2017-09-01 DIAGNOSIS — R293 Abnormal posture: Secondary | ICD-10-CM | POA: Diagnosis present

## 2017-09-01 LAB — CBC WITH DIFFERENTIAL/PLATELET
BASO%: 0.3 % (ref 0.0–2.0)
BASOS ABS: 0 10*3/uL (ref 0.0–0.1)
EOS ABS: 0.1 10*3/uL (ref 0.0–0.5)
EOS%: 1.4 % (ref 0.0–7.0)
HEMATOCRIT: 41 % (ref 34.8–46.6)
HEMOGLOBIN: 13.7 g/dL (ref 11.6–15.9)
LYMPH#: 1.5 10*3/uL (ref 0.9–3.3)
LYMPH%: 20.9 % (ref 14.0–49.7)
MCH: 33 pg (ref 25.1–34.0)
MCHC: 33.4 g/dL (ref 31.5–36.0)
MCV: 98.8 fL (ref 79.5–101.0)
MONO#: 0.6 10*3/uL (ref 0.1–0.9)
MONO%: 9 % (ref 0.0–14.0)
NEUT#: 4.8 10*3/uL (ref 1.5–6.5)
NEUT%: 68.4 % (ref 38.4–76.8)
Platelets: 273 10*3/uL (ref 145–400)
RBC: 4.15 10*6/uL (ref 3.70–5.45)
RDW: 13.8 % (ref 11.2–14.5)
WBC: 7 10*3/uL (ref 3.9–10.3)

## 2017-09-01 LAB — COMPREHENSIVE METABOLIC PANEL
ALT: 21 U/L (ref 0–55)
ANION GAP: 9 meq/L (ref 3–11)
AST: 21 U/L (ref 5–34)
Albumin: 4.2 g/dL (ref 3.5–5.0)
Alkaline Phosphatase: 60 U/L (ref 40–150)
BUN: 9.2 mg/dL (ref 7.0–26.0)
CHLORIDE: 105 meq/L (ref 98–109)
CO2: 25 meq/L (ref 22–29)
CREATININE: 0.7 mg/dL (ref 0.6–1.1)
Calcium: 9.4 mg/dL (ref 8.4–10.4)
Glucose: 83 mg/dl (ref 70–140)
Potassium: 4.4 mEq/L (ref 3.5–5.1)
SODIUM: 138 meq/L (ref 136–145)
Total Bilirubin: 0.65 mg/dL (ref 0.20–1.20)
Total Protein: 7.4 g/dL (ref 6.4–8.3)

## 2017-09-01 NOTE — Progress Notes (Signed)
Radiation Oncology         [phone] ________________________________  Name: Hannah [facility]        MRN: [id number]  Date of Service: 09/01/2017 DOB: 1971/08/28  CC:Unk Pinto, MD  Jovita Kussmaul, MD     REFERRING PHYSICIAN: Autumn Messing III, MD   DIAGNOSIS: The encounter diagnosis was Malignant neoplasm of upper-inner quadrant of right breast in female, estrogen receptor positive ([place]).   HISTORY OF PRESENT ILLNESS: Hannah [facility] is a 46 y.o. female seen in the multidisciplinary breast clinic for a new diagnosis of right breast cancer. The patient was noted to have a cyst that had been followed in surveillance with diagnostic mammography. She had a recent mammogram that revealed a new mass in the upper inner quadrant that was posterior. The mass measured 1.4 cm at the 1:00 position, and her ultrasound was negative by ultrasound for any adenopathy. She did have concern though for possible pectoralis invasion. She underwent a biopsy of this mass on 08/24/17 which revealed a grade 2 invasive ductal carcinoma, triple positive, with a HER2 amplification of 2.85 and Ki 67 of 60%. She comes today to discuss options of treatment for her cancer.    PREVIOUS RADIATION THERAPY: No   PAST MEDICAL HISTORY:  Past Medical History:  Diagnosis Date  . Plantar fasciitis        PAST SURGICAL HISTORY:No past surgical history on file.   FAMILY HISTORY:  Family History  Problem Relation Age of Onset  . Cancer Mother        ovarian  . Cancer Father        prosate  . Ulcerative colitis Sister   . Uterine cancer Maternal Aunt      SOCIAL HISTORY:  reports that  has never smoked. she has never used smokeless tobacco. She reports that she drinks alcohol. She reports that she does not use drugs. The patient is married and lives in [place]. She works for [employer].    ALLERGIES: Milk-related compounds and Poison ivy extract [poison ivy extract]   MEDICATIONS:    Current Outpatient Medications  Medication Sig Dispense Refill  . aspirin 81 MG tablet Take 81 mg by mouth daily.    Marland Kitchen b complex vitamins tablet Take 1 tablet by mouth daily.    . Cholecalciferol (VITAMIN D) 2000 UNITS tablet Take 10,000 Units by mouth daily.     . ciprofloxacin (CIPRO) 500 MG tablet Take 1 tablet (500 mg total) by mouth 2 (two) times daily. 14 tablet 0  . fish oil-omega-3 fatty acids 1000 MG capsule Take 1 g by mouth daily.    Nyoka Cowden Tea 250 MG CAPS Take by mouth 2 (two) times daily.    . hyoscyamine (LEVSIN SL) 0.125 MG SL tablet Take 1 to 2 tablets 3 to 4 x day if needed for Nausea, vomiting, cramping or diarrhea 90 tablet 0  . Iodine Strong, Lugols, (STRONG IODINE) 5 % solution Take 0.2 mLs by mouth 3 (three) times daily. 480 mL 0  . ondansetron (ZOFRAN ODT) 8 MG disintegrating tablet 76m ODT q6 hours prn nausea 30 tablet 0  . phenazopyridine (PYRIDIUM) 200 MG tablet Take 1 tablet (200 mg total) by mouth 3 (three) times daily. 6 tablet 0   No current facility-administered medications for this encounter.      REVIEW OF SYSTEMS: On review of systems, the patient reports that she is doing well overall. She denies any chest pain, shortness of breath, cough, fevers,  chills, night sweats, unintended weight changes. She denies any bowel or bladder disturbances, and denies abdominal pain, nausea or vomiting. She denies any new musculoskeletal or joint aches or pains. A complete review of systems is obtained and is otherwise negative.     PHYSICAL EXAM:  Wt Readings from Last 3 Encounters:  09/01/17 166 lb 1.6 oz (75.3 kg)  05/18/17 169 lb (76.7 kg)  05/06/17 162 lb (73.5 kg)   Temp Readings from Last 3 Encounters:  09/01/17 98 F (36.7 C) (Oral)  05/06/17 98.2 F (36.8 C) (Oral)  12/16/16 97.3 F (36.3 C)   BP Readings from Last 3 Encounters:  09/01/17 (!) 110/56  05/06/17 104/67  12/16/16 110/76   Pulse Readings from Last 3 Encounters:  09/01/17 76  05/06/17  60  12/16/16 93     In general this is a well appearing caucasian female in no acute distress. She is alert and oriented x4 and appropriate throughout the examination. HEENT reveals that the patient is normocephalic, atraumatic. EOMs are intact. PERRLA. Skin is intact without any evidence of gross lesions. Cardiovascular exam reveals a regular rate and rhythm, no clicks rubs or murmurs are auscultated. Chest is clear to auscultation bilaterally. Lymphatic assessment is performed and does not reveal any adenopathy in the cervical, supraclavicular, axillary, or inguinal chains. Bilateral breast exam is performed and reveals large bilateral breasts, there is mild fullness in there right breast consistent with prior biopsy no palpable masses are noted otherwise in eather breast, and no nipple bleeding or discharge is noted.. Abdomen has active bowel sounds in all quadrants and is intact. The abdomen is soft, non tender, non distended. Lower extremities are negative for pretibial pitting edema, deep calf tenderness, cyanosis or clubbing.   ECOG = 0  0 - Asymptomatic (Fully active, able to carry on all predisease activities without restriction)  1 - Symptomatic but completely ambulatory (Restricted in physically strenuous activity but ambulatory and able to carry out work of a light or sedentary nature. For example, light housework, office work)  2 - Symptomatic, <50% in bed during the day (Ambulatory and capable of all self care but unable to carry out any work activities. Up and about more than 50% of waking hours)  3 - Symptomatic, >50% in bed, but not bedbound (Capable of only limited self-care, confined to bed or chair 50% or more of waking hours)  4 - Bedbound (Completely disabled. Cannot carry on any self-care. Totally confined to bed or chair)  5 - Death   Eustace Pen MM, Creech RH, Tormey DC, et al. [phone]). "Toxicity and response criteria of the [facility]". [place]  Oncol. 5 (6): 649-55    LABORATORY DATA:  Lab Results  Component Value Date   WBC 7.0 09/01/2017   HGB 13.7 09/01/2017   HCT 41.0 09/01/2017   MCV 98.8 09/01/2017   PLT 273 09/01/2017   Lab Results  Component Value Date   NA 138 09/01/2017   K 4.4 09/01/2017   CL 107 05/18/2017   CO2 25 09/01/2017   Lab Results  Component Value Date   ALT 21 09/01/2017   AST 21 09/01/2017   ALKPHOS 60 09/01/2017   BILITOT 0.65 09/01/2017      RADIOGRAPHY: No results found.     IMPRESSION/PLAN: 1. 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 Dr. Lisbeth Renshaw discusses the pathology findings and reviews the nature of invasive triple positive breast disease. The consensus  from the breast conference included evaluation with genetic counseling. This could play a role in her decision making for surgical approach. She would be a candidate for neoadjuvant chemotherapy followed by surgical resection. If she did not undergo mastectomy, and rather underwent breast conservation surgery with sentinel mapping, or if she had high risk features after undergoing mastectomy, radiation would be considered. Following surgery, we would revisit her case and meet back to discuss radiation as it pertains to her pathologic findings. We discussed the risks, benefits, short, and long term effects of radiotherapy. Dr. Lisbeth Renshaw discusses the delivery and logistics of radiotherapy and anticipates a course of 6 1/2 weeks if she needed radiotherapy. We will await the results of her genetics, and decision making regarding neoadjuvant chemotherapy. She is also contemplating second opinion at [facility] outside facility, but we would be happy to see her back as clinically relevant. 2. Possible genetic predisposition to malignancy. The patient's personal history and family history make her eligible for genetic testing, which will help her decide surgical approach and identify other screening factors that  could play a role.  3. Contraceptive counseling. Dr. Jana Hakim has discussed the importance of avoiding pregnancy while on chemotherapy and will discuss the approach specifically with her. She will also have a pregnancy test ordered following today's visit.    The above documentation reflects my direct findings during this shared patient visit. Please see the separate note by Dr. Lisbeth Renshaw on this date for the remainder of the patient's plan of care.    Carola Rhine, PAC

## 2017-09-01 NOTE — Progress Notes (Signed)
[place]  [phone] [phone]     ID: Hannah Woodard DOB: Nov 07, 1970  MR#: [id number]  AYT#:[id number]  Patient Care Team: Unk Pinto, MD as PCP - General (Internal Medicine) Chauncey Cruel, MD OTHER MD:  CHIEF COMPLAINT: Triple positive breast cancer  CURRENT TREATMENT: Offered neoadjuvant treatment   HISTORY OF CURRENT ILLNESS: The patient has a history of cysts and nodules in the right breast which have been closely followed, with exams 07/30/2016 and 01/28/2017.  On 07/29/2017 follow-up right breast ultrasonography showed no findings of concern.  Bilateral diagnostic mammography 08/18/2017 with right breast ultrasonography on 08/18/2017 at [facility] found the breast density to be category C.  There was now a new irregular high density mass in the posterior portion of the right breast seen on the mediolateral oblique view only.  Ultrasonography confirmed a 1.4 cm lobulated mass in the upper inner quadrant of the right breast 10 cm from the nipple associated with pectoral muscle invasion.  The right axilla was sonographically benign.  Biopsy of this mass 08/24/2017 showed ([id number]) invasive ductal carcinoma, grade 2, estrogen receptor 95% positive, progesterone receptor 100% positive, both with strong staining intensity, with an MIB-1 of 60%, and HER-2 amplified, with a signals ratio of 2.84 (full report pending).  The patient's subsequent history is as detailed below.  INTERVAL HISTORY: "Hannah Woodard" was evaluated in the multidisciplinary breast cancer clinic 09/01/2017 accompanied by her husband Catalina Antigua and her friend Jackelyn Poling. Her case was also presented at the multidisciplinary breast cancer conference on the same day. At that time a preliminary plan was proposed: Neoadjuvant chemotherapy, followed by definitive surgery, adjuvant radiation, and antiestrogen therapy, of course with anti-HER-2 therapy.  Also genetics testing   REVIEW OF  SYSTEMS: There were no specific symptoms leading to the original mammogram, which was routinely scheduled. The patient denies unusual headaches, visual changes, nausea, vomiting, stiff neck, dizziness, or gait imbalance. There has been no cough, phlegm production, or pleurisy, no chest pain or pressure, and no change in bowel or bladder habits. The patient denies fever, rash, bleeding, unexplained fatigue or unexplained weight loss. A detailed review of systems was otherwise entirely negative.   PAST MEDICAL HISTORY: Past Medical History:  Diagnosis Date  . Plantar fasciitis     PAST SURGICAL HISTORY: History reviewed. No pertinent surgical history.  FAMILY HISTORY Family History  Problem Relation Age of Onset  . Cancer Mother        ovarian  . Cancer Father        prosate  . Ulcerative colitis Sister   . Uterine cancer Maternal Aunt   The patient's father died from alcoholic cirrhosis at age 22.  He had been diagnosed with prostate cancer at age 85.  The patient's mother was diagnosed with ovarian cancer at age 19 and died a year later.  The patient has 1 brother, 1 sister.  The patient's brother has had basal cell and other skin cancers.  A paternal aunt had uterine cancer.  Other relatives have had skin cancers but the patient does not know if these were melanomas or not  GYNECOLOGIC HISTORY:  No LMP recorded.  Menarche age 65, the patient has never been pregnant.  She is still having regular periods.  She used oral contraceptives briefly in the past without complications.  She and her husband are very interested in having children, and currently (December 2018) they are not using any contraception.   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  Her husband Rodman Key (goes by [employer]" is a Dealer.  At home it's just them, a [employer] and 2 cats.     ADVANCED DIRECTIVES:    HEALTH MAINTENANCE: Social  History   Tobacco Use  . Smoking status: Never Smoker  . Smokeless tobacco: Never Used  Substance Use Topics  . Alcohol use: Yes  . Drug use: No     Colonoscopy: Never  PAP:  Bone density: Never   Allergies  Allergen Reactions  . Milk-Related Compounds Diarrhea    Stomach cramping  . Poison Ivy Extract [Poison Ivy Extract] Hives, Itching and Rash    Current Outpatient Medications  Medication Sig Dispense Refill  . aspirin 81 MG tablet Take 81 mg by mouth daily.    Marland Kitchen b complex vitamins tablet Take 1 tablet by mouth daily.    . Cholecalciferol (VITAMIN D) 2000 UNITS tablet Take 10,000 Units by mouth daily.     . ciprofloxacin (CIPRO) 500 MG tablet Take 1 tablet (500 mg total) by mouth 2 (two) times daily. 14 tablet 0  . fish oil-omega-3 fatty acids 1000 MG capsule Take 1 g by mouth daily.    Nyoka Cowden Tea 250 MG CAPS Take by mouth 2 (two) times daily.    . hyoscyamine (LEVSIN SL) 0.125 MG SL tablet Take 1 to 2 tablets 3 to 4 x day if needed for Nausea, vomiting, cramping or diarrhea 90 tablet 0  . Iodine Strong, Lugols, (STRONG IODINE) 5 % solution Take 0.2 mLs by mouth 3 (three) times daily. 480 mL 0  . ondansetron (ZOFRAN ODT) 8 MG disintegrating tablet 29m ODT q6 hours prn nausea 30 tablet 0  . phenazopyridine (PYRIDIUM) 200 MG tablet Take 1 tablet (200 mg total) by mouth 3 (three) times daily. 6 tablet 0   No current facility-administered medications for this visit.     OBJECTIVE: Young white woman who appears well  Vitals:   09/01/17 1301  BP: (!) 110/56  Pulse: 76  Resp: 18  Temp: 98 F (36.7 C)  SpO2: 100%     Body mass index is 26.81 kg/m.   Wt Readings from Last 3 Encounters:  09/01/17 166 lb 1.6 oz (75.3 kg)  05/18/17 169 lb (76.7 kg)  05/06/17 162 lb (73.5 kg)      ECOG FS:0 - Asymptomatic  Ocular: Sclerae unicteric, pupils round and equal Ear-nose-throat: Oropharynx clear and moist Lymphatic: No cervical or supraclavicular adenopathy Lungs no rales  or rhonchi Heart regular rate and rhythm Abd soft, nontender, positive bowel sounds MSK no focal spinal tenderness, no joint edema Neuro: non-focal, well-oriented, appropriate affect Breasts: The right breast is status post recent biopsy.  There is a moderate ecchymosis.  I do not palpate a mass.  The left breast is benign.  Both axillae are benign.   LAB RESULTS:  CMP     Component Value Date/Time   NA 138 09/01/2017 1238   K 4.4 09/01/2017 1238   CL 107 05/18/2017 0928   CO2 25 09/01/2017 1238   GLUCOSE 83 09/01/2017 1238   BUN 9.2 09/01/2017 1238   CREATININE 0.7 09/01/2017 1238   CALCIUM 9.4 09/01/2017 1238   PROT 7.4 09/01/2017 1238   ALBUMIN 4.2 09/01/2017 1238   AST 21 09/01/2017 1238   ALT 21 09/01/2017 1238   ALKPHOS 60 09/01/2017 1238   BILITOT 0.65 09/01/2017 1238   GFRNONAA 111 05/18/2017 0928   GFRAA 128 05/18/2017 04332  No results found for: TOTALPROTELP, ALBUMINELP, A1GS, A2GS, BETS, BETA2SER, GAMS, MSPIKE, SPEI  No results found for: Nils Pyle, [facility]  Lab Results  Component Value Date   WBC 7.0 09/01/2017   NEUTROABS 4.8 09/01/2017   HGB 13.7 09/01/2017   HCT 41.0 09/01/2017   MCV 98.8 09/01/2017   PLT 273 09/01/2017      Chemistry      Component Value Date/Time   NA 138 09/01/2017 1238   K 4.4 09/01/2017 1238   CL 107 05/18/2017 0928   CO2 25 09/01/2017 1238   BUN 9.2 09/01/2017 1238   CREATININE 0.7 09/01/2017 1238      Component Value Date/Time   CALCIUM 9.4 09/01/2017 1238   ALKPHOS 60 09/01/2017 1238   AST 21 09/01/2017 1238   ALT 21 09/01/2017 1238   BILITOT 0.65 09/01/2017 1238       No results found for: LABCA2  No components found for: QIONGE952  No results for input(s): INR in the last 168 hours.  No results found for: LABCA2  No results found for: WUX324  No results found for: MWN027  No results found for: OZD664  No results found for: CA2729  No components found for: HGQUANT  No  results found for: CEA1 / No results found for: CEA1   No results found for: AFPTUMOR  No results found for: CHROMOGRNA  No results found for: PSA1  Appointment on 09/01/2017  Component Date Value Ref Range Status  . Sodium 09/01/2017 138  136 - 145 mEq/L Final  . Potassium 09/01/2017 4.4  3.5 - 5.1 mEq/L Final  . Chloride 09/01/2017 105  98 - 109 mEq/L Final  . CO2 09/01/2017 25  22 - 29 mEq/L Final  . Glucose 09/01/2017 83  70 - 140 mg/dl Final   Glucose reference range is for nonfasting patients. Fasting glucose reference range is 70- 100.  Marland Kitchen BUN 09/01/2017 9.2  7.0 - 26.0 mg/dL Final  . Creatinine 09/01/2017 0.7  0.6 - 1.1 mg/dL Final  . Total Bilirubin 09/01/2017 0.65  0.20 - 1.20 mg/dL Final  . Alkaline Phosphatase 09/01/2017 60  40 - 150 U/L Final  . AST 09/01/2017 21  5 - 34 U/L Final  . ALT 09/01/2017 21  0 - 55 U/L Final  . Total Protein 09/01/2017 7.4  6.4 - 8.3 g/dL Final  . Albumin 09/01/2017 4.2  3.5 - 5.0 g/dL Final  . Calcium 09/01/2017 9.4  8.4 - 10.4 mg/dL Final  . Anion Gap 09/01/2017 9  3 - 11 mEq/L Final  . EGFR 09/01/2017 >60  >60 ml/min/1.73 m2 Final   eGFR is calculated using the CKD-EPI Creatinine Equation (2009)  . WBC 09/01/2017 7.0  3.9 - 10.3 10e3/uL Final  . NEUT# 09/01/2017 4.8  1.5 - 6.5 10e3/uL Final  . HGB 09/01/2017 13.7  11.6 - 15.9 g/dL Final  . HCT 09/01/2017 41.0  34.8 - 46.6 % Final  . Platelets 09/01/2017 273  145 - 400 10e3/uL Final  . MCV 09/01/2017 98.8  79.5 - 101.0 fL Final  . MCH 09/01/2017 33.0  25.1 - 34.0 pg Final  . MCHC 09/01/2017 33.4  31.5 - 36.0 g/dL Final  . RBC 09/01/2017 4.15  3.70 - 5.45 10e6/uL Final  . RDW 09/01/2017 13.8  11.2 - 14.5 % Final  . lymph# 09/01/2017 1.5  0.9 - 3.3 10e3/uL Final  . MONO# 09/01/2017 0.6  0.1 - 0.9 10e3/uL Final  . Eosinophils Absolute 09/01/2017 0.1  0.0 -  0.5 10e3/uL Final  . Basophils Absolute 09/01/2017 0.0  0.0 - 0.1 10e3/uL Final  . NEUT% 09/01/2017 68.4  38.4 - 76.8 % Final  .  LYMPH% 09/01/2017 20.9  14.0 - 49.7 % Final  . MONO% 09/01/2017 9.0  0.0 - 14.0 % Final  . EOS% 09/01/2017 1.4  0.0 - 7.0 % Final  . BASO% 09/01/2017 0.3  0.0 - 2.0 % Final    (this displays the last labs from the last 3 days)  No results found for: TOTALPROTELP, ALBUMINELP, A1GS, A2GS, BETS, BETA2SER, GAMS, MSPIKE, SPEI (this displays SPEP labs)  No results found for: KPAFRELGTCHN, LAMBDASER, KAPLAMBRATIO (kappa/lambda light chains)  No results found for: HGBA, HGBA2QUANT, HGBFQUANT, HGBSQUAN (Hemoglobinopathy evaluation)   No results found for: LDH  Lab Results  Component Value Date   IRON 128 12/16/2016   TIBC 355 12/16/2016   IRONPCTSAT 36 12/16/2016   (Iron and TIBC)  Lab Results  Component Value Date   FERRITIN 67 12/16/2016    Urinalysis    Component Value Date/Time   COLORURINE YELLOW 06/16/2017 [place] 06/16/2017 1442   LABSPEC 1.020 06/16/2017 1442   PHURINE 6.5 06/16/2017 1442   GLUCOSEU NEGATIVE 06/16/2017 1442   HGBUR 2+ (A) 06/16/2017 1442   BILIRUBINUR NEGATIVE 05/18/2017 0928   KETONESUR NEGATIVE 06/16/2017 1442   PROTEINUR NEGATIVE 06/16/2017 1442   UROBILINOGEN 0.2 12/06/2014 1529   NITRITE NEGATIVE 06/16/2017 1442   LEUKOCYTESUR NEGATIVE 06/16/2017 1442     STUDIES: No results found.  ELIGIBLE FOR AVAILABLE RESEARCH PROTOCOL: No  ASSESSMENT: 46 y.o. [place] woman status post right breast upper inner quadrant biopsy 08/24/2017 for a clinical T1c N0 invasive ductal carcinoma, triple positive, with an MIB-1 of 60%  (1) neoadjuvant chemotherapy to consist of paclitaxel and trastuzumab  (2) continue trastuzumab to complete a year  (3) genetics testing pending  (4) definitive surgery depending on results of genetics testing  (5) adjuvant radiation to follow surgery  (6) adjuvant antiestrogens to follow at the completion of local treatment  (7) goserelin for fertility preservation  PLAN: We spent the better part  of today's hour-long appointment discussing the biology of her diagnosis and the specifics of her situation. We first reviewed the fact that cancer is not one disease but more than 100 different diseases and that it is important to keep them separate-- otherwise when friends and relatives discuss their own cancer experiences with Felisa confusion can result. Similarly we explained that if breast cancer spreads to the bone or liver, the patient would not have bone cancer or liver cancer, but breast cancer in the bone and breast cancer in the liver: one cancer in three places-- not 3 different cancers which otherwise would have to be treated in 3 different ways.  We discussed the difference between local and systemic therapy. In terms of loco-regional treatment, lumpectomy plus radiation is equivalent to mastectomy as far as survival is concerned. For this reason, and because the cosmetic results are generally superior, we recommend breast conserving surgery.  One concern in [place] case of course is that the cancer appears to be invading the pectoralis muscle  Hannah Woodard does qualify for genetics testing.In patients who carry a deleterious mutation [for example in a  BRCA gene], the risk of a new breast cancer developing in the future may be sufficiently great that the patient may choose bilateral mastectomies. However if she wishes to keep her breasts in that situation it is safe to do so. That would  require intensified screening, which generally means not only yearly mammography but a yearly breast MRI as well. Of course, if there is a deleterious mutation bilateral oophorectomy would be necessary as there is no standard screening protocol for ovarian cancer.  Finally we also noted that in terms of sequencing of treatments, whether systemic therapy or surgery is done first does not affect the ultimate outcome.  Our recommendation is for neoadjuvant chemotherapy which will hopefully move the cancer away from the  pectoralis muscle and also give her time especially if she proves to carry a high risk mutation to explore reconstruction options without any feeling of urgency  We then discussed the rationale for systemic therapy. There is some risk that this cancer may have already spread to other parts of her body in her case I quoted her risk somewhere between 30 and 42%.  Given the fact that triple positive patients risk of systemic recurrence drops to 1/6 of baseline this means with standard therapy (chemotherapy, anti-HER-2 treatment, and antiestrogens) she could expect a 5-7% risk of distant recurrence the case here.  Next we went over the options for systemic therapy which are anti-estrogens, anti-HER-2 immunotherapy, and chemotherapy. Georgann meets criteria for all 3 and that is our recommendation  Specifically I recommend we start goserelin to optimize fertility preservation.  She would then receive paclitaxel weekly x12, other with trastuzumab.  Note that this combination in at least one study found a 70% rate of fertility preservation (Breast Cancer Res Treat. 2015 Jun;151(3):589-96. doi: 10.1007/s10549-5754222220-z. Epub 2015 May 16.)  Once the chemotherapy is done she would proceed to surgery then radiation, continuing trastuzumab to complete 1 year.  She would start antiestrogens at the completion of local therapy.  She also will undergo genetics testing and if possible she might be interested in her bilateral mastectomies.  I am tentatively scheduling her back to see me 09/17/2017.  Note that she is interested in alternative therapies, is seeking at least one second opinion elsewhere, and has not decided whether she wants any systemic therapy at all in light of her desire to become pregnant  She knows to call for any other issues that may develop before her next visit.            Aline has a good understanding of the overall plan. She agrees with it. She knows the goal of treatment in her  case is cure. She will call with any problems that may develop before her next visit here.  Chauncey Cruel, MD   09/01/2017 4:04 PM Medical Oncology and Hematology [facility] [address] [place], [place] [address] Tel. [phone]    Fax. [phone]

## 2017-09-02 ENCOUNTER — Ambulatory Visit (HOSPITAL_BASED_OUTPATIENT_CLINIC_OR_DEPARTMENT_OTHER): Payer: BC Managed Care – PPO | Admitting: Genetics

## 2017-09-02 ENCOUNTER — Other Ambulatory Visit: Payer: BC Managed Care – PPO

## 2017-09-02 ENCOUNTER — Encounter: Payer: Self-pay | Admitting: Physical Therapy

## 2017-09-02 ENCOUNTER — Other Ambulatory Visit (HOSPITAL_COMMUNITY)
Admission: RE | Admit: 2017-09-02 | Discharge: 2017-09-02 | Disposition: A | Discharge status: Home / Self Care | Payer: BC Managed Care – PPO | Source: Ambulatory Visit | Attending: Oncology | Admitting: Oncology

## 2017-09-02 ENCOUNTER — Telehealth: Payer: Self-pay | Admitting: Oncology

## 2017-09-02 ENCOUNTER — Encounter: Payer: Self-pay | Admitting: Genetics

## 2017-09-02 DIAGNOSIS — C50211 Malignant neoplasm of upper-inner quadrant of right female breast: Secondary | ICD-10-CM

## 2017-09-02 DIAGNOSIS — Z8042 Family history of malignant neoplasm of prostate: Secondary | ICD-10-CM | POA: Diagnosis not present

## 2017-09-02 DIAGNOSIS — Z8041 Family history of malignant neoplasm of ovary: Secondary | ICD-10-CM

## 2017-09-02 DIAGNOSIS — Z7183 Encounter for nonprocreative genetic counseling: Secondary | ICD-10-CM | POA: Diagnosis not present

## 2017-09-02 DIAGNOSIS — Z170 Estrogen receptor positive status [ER+]: Secondary | ICD-10-CM

## 2017-09-02 DIAGNOSIS — Z8049 Family history of malignant neoplasm of other genital organs: Secondary | ICD-10-CM | POA: Diagnosis not present

## 2017-09-02 HISTORY — DX: Family history of malignant neoplasm of prostate: Z80.42

## 2017-09-02 HISTORY — DX: Family history of malignant neoplasm of ovary: Z80.41

## 2017-09-02 HISTORY — DX: Family history of malignant neoplasm of other genital organs: Z80.49

## 2017-09-02 LAB — PREGNANCY, URINE: PREG TEST UR: NEGATIVE

## 2017-09-02 NOTE — Patient Instructions (Signed)

## 2017-09-02 NOTE — Progress Notes (Signed)
REFERRING PROVIDER: Chauncey Cruel, MD [address] [place], [place] [address]  PRIMARY PROVIDER:  Unk Pinto, MD  PRIMARY REASON FOR VISIT:  1. Malignant neoplasm of upper-inner quadrant of right breast in female, estrogen receptor positive ([place])   2. Family history of ovarian cancer   3. Family history of uterine cancer   4. Family history of prostate cancer in father     HISTORY OF PRESENT ILLNESS:   Hannah Woodard, a 46 y.o. female, was seen for a [facility] cancer genetics consultation at the request of Dr. Jana Woodard due to a personal and family history of cancer.  Hannah Woodard presents to clinic today to discuss the possibility of a hereditary predisposition to cancer, genetic testing, and to further clarify her future cancer risks, as well as potential cancer risks for family members.   On 08/24/2017, at the age of 12, Hannah Woodard was diagnosed with grade 2 Invasive Ductal Carcinoma of the right breast. She is currently considering treatment options at this time.    HORMONAL RISK FACTORS:  Menarche was at age 15.  First live birth at age N/A.  Ovaries intact: yes.  Hysterectomy: no.  Menopausal status: premenopausal.  Mammogram within the last year: yes.  Past Medical History:  Diagnosis Date  . Family history of ovarian cancer 09/02/2017  . Family history of prostate cancer in father 09/02/2017  . Family history of uterine cancer 09/02/2017  . Plantar fasciitis     No past surgical history on file.  Social History   Socioeconomic History  . Marital status: Single    Spouse name: Not on file  . Number of children: Not on file  . Years of education: Not on file  . Highest education level: Not on file  Social Needs  . Financial resource strain: Not on file  . Food insecurity - worry: Not on file  . Food insecurity - inability: Not on file  . Transportation needs - medical: Not on file  . Transportation needs - non-medical: Not on file  Occupational  History  . Not on file  Tobacco Use  . Smoking status: Never Smoker  . Smokeless tobacco: Never Used  Substance and Sexual Activity  . Alcohol use: Yes  . Drug use: No  . Sexual activity: Not on file  Other Topics Concern  . Not on file  Social History Narrative  . Not on file     FAMILY HISTORY:  We obtained a detailed, 4-generation family history.  Significant diagnoses are listed below:  Family History  Problem Relation Age of Onset  . Ovarian cancer Mother 13  . Prostate cancer Father 58       'high gleason' unsure number  . Ulcerative colitis Sister   . Other Sister        abn ovaian growth, partial hysterectomy  . Uterine cancer Maternal Aunt 4  . Stroke Maternal Aunt 66  . Basal cell carcinoma Brother   . Skin cancer Paternal Uncle   . Skin cancer Maternal Grandmother   . Stroke Paternal Grandfather 68  . Hydrocephalus Cousin    Hannah Woodard does not have any children.  She has 1 brother who has had basal cell carcinoma and 3 other moles removed (unsure if cancer).  She also has a sister who is 13.  She had a partial hysterectomy and her ovaries had an abnormal growth (one was larger than the other) but it was not cancer. This sister has 4 children with no  history of cancer.    Hannah Woodard father died at 73 due to alcohol induced cirrhosis.  He was diagnosed with prostate cancer at 68, she reports his gleason score was high, but does not remember the number.  Hannah Woodard has a paternal uncle who is deceased and had skin cancer on his arm.  He was a Pharmacist, community and this may have been due to sun exposure.  He had 5 children with no history of cancer.  Hannah Woodard also has a 79 year-old paternal aunt who has no history of cancer. She has 2 children, one died at 16 in an accident and the other is 42 with no history of cancer.  Ms. Dulski paternal grandfather died at 61 and had some type of skin cancer in his lifetime.  Her paternal grandmother died at 71 of pneumonia.    Ms.  Herbst mother died at 44 of ovarian cancer diagnosed at 55.  Ms. Layfield has a maternal aunt who was diagnosed with uterine cancer in her 13's and had a stroke at 66.  This aunt is now in her 36's.  Ms. Rightmyer has 1 maternal cousin who is 9.  He was born with hydrocephalus and is blind.  Ms. Colgate maternal grandmother died at 40 and had a stroke at age 47.  Ms. Chopp maternal grandfather died at 63 with no history of cancer.  His mother (patient's GGM) died of a uterine issue, unsure if it was cancer.     Ms. Bungert is unaware of previous family history of genetic testing for hereditary cancer risks. Patient's maternal ancestors are of Norwegian/German descent, and paternal ancestors are of Northern European descent. There is no reported Ashkenazi Jewish ancestry. There is no known consanguinity.  GENETIC COUNSELING ASSESSMENT: Hannah Woodard is a 46 y.o. female with a personal and family history which is somewhat suggestive of a Hereditary Cancer Predisposition Syndrome. We, therefore, discussed and recommended the following at today's visit.   DISCUSSION:   We reviewed the characteristics, features and inheritance patterns of hereditary cancer syndromes. We also discussed genetic testing, including the appropriate family members to test, the process of testing, insurance coverage and turn-around-time for results. We discussed the implications of a negative, positive and/or variant of uncertain significant result. In order to get genetic test results in a timely manner so that Hannah Woodard can use these genetic test results for surgical decisions, we recommended Hannah Woodard pursue genetic testing for the Breast Cancer STAT panel. The STAT Breast cancer panel offered by [facility] includes sequencing and rearrangement analysis for the following 9 genes:  ATM, BRCA1, BRCA2, CDH1, CHEK2, PALB2, PTEN, STK11 and TP53.    Afterwards, we then recommend Hannah Woodard pursue reflex genetic testing to  the Common Hereditary Cancer gene panel. The Common Hereditary Cancer Gene Panel offered by [facility] includes sequencing and/or deletion duplication testing of the following 47 genes: APC, ATM, AXIN2, BARD1, BMPR1A, BRCA1, BRCA2, BRIP1, CDH1, CDKN2A (p14ARF), CDKN2A (p16INK4a), CKD4, CHEK2, CTNNA1, DICER1, EPCAM (Deletion/duplication testing only), GREM1 (promoter region deletion/duplication testing only), KIT, MEN1, MLH1, MSH2, MSH3, MSH6, MUTYH, NBN, NF1, NHTL1, PALB2, PDGFRA, PMS2, POLD1, POLE, PTEN, RAD50, RAD51C, RAD51D, SDHB, SDHC, SDHD, SMAD4, SMARCA4. STK11, TP53, TSC1, TSC2, and VHL.  The following genes were evaluated for sequence changes only: SDHA and HOXB13 c.251G>A variant only.  We discussed that only 5-10% of cancers are associated with a Hereditary cancer predisposition syndrome.  One of the most common hereditary cancer syndromes that increases breast cancer risk is  called Hereditary Breast and Ovarian Cancer (HBOC) syndrome.  This syndrome is caused by mutations in the BRCA1 and BRCA2 genes.  This syndrome increases an individual's lifetime risk to develop breast, ovarian, pancreatic, and other types of cancer.  There are also many other cancer predisposition syndromes caused by mutations in several other genes.  We discussed that if she is found to have a mutation in one of these genes, it may impact surgical decisions, and alter future medical management recommendations such as increased cancer screenings and consideration of risk reducing surgeries.  A positive result could also have implications for the patient's family members.  We also briefly discussed that some of these genes can make you a carrier for autosomal recessive Fanconi Anemia (a childhood onset disorder).  This could impact her reproductive decisions as she and her partner are currently trying to get pregnant.   A Negative result would mean we were unable to identify a hereditary component to her cancer, but does not rule  out the possibility of a hereditary basis for her cancer.  There could be mutations that are undetectable by current technology, or in genes not yet tested or identified to increase cancer risk.    We discussed the potential to find a Variant of Uncertain Significance or VUS.  These are variants that have not yet been identified as pathogenic or benign, and it is unknown if this variant is associated with increased cancer risk or if this is a normal finding.  Most VUS's are reclassified to benign or likely benign.   It should not be used to make medical management decisions. With time, we suspect the lab will determine the significance of any VUS's identified if any.   Based on Hannah Woodard's personal and family history of cancer, she meets medical criteria for genetic testing. Despite that she meets criteria, she may still have an out of pocket cost. We discussed that if her out of pocket cost for testing is over $100, the laboratory will call and confirm whether she wants to proceed with testing.  If the out of pocket cost of testing is less than $100 she will be billed by the genetic testing laboratory.   PLAN: After considering the risks, benefits, and limitations, Hannah Woodard  provided informed consent to pursue genetic testing and the blood sample was sent to [facility] for analysis of the Breast Cancer STAT Panel with plans to reflex to a larger panel (she is deciding between 47 gene and 83 gene panel). Results should be available within approximately 2-3 weeks' time, at which point they will be disclosed by telephone to Hannah Woodard, as will any additional recommendations warranted by these results. Hannah Woodard will receive a summary of her genetic counseling visit and a copy of her results once available. This information will also be available in Epic. We encouraged Hannah Woodard to remain in contact with cancer genetics annually so that we can continuously update the family history and inform  her of any changes in cancer genetics and testing that may be of benefit for her family. Hannah Woodard questions were answered to her satisfaction today. Our contact information was provided should additional questions or concerns arise.  Based on Ms. Sherwin's family history, we recommended her siblings and all maternal relatives, have genetic counseling and testing. Hannah Woodard will let us know if we can be of any assistance in coordinating genetic counseling and/or testing for this family member.   Lastly, we encouraged Ms. Starkey to remain  in contact with cancer genetics annually so that we can continuously update the family history and inform her of any changes in cancer genetics and testing that may be of benefit for this family.   Ms.  Garabedian questions were answered to her satisfaction today. Our contact information was provided should additional questions or concerns arise. Thank you for the referral and allowing [place] to share in the care of your patient.   Tana Felts, MS Genetic Counselor [email or web address] phone: [phone]  The patient was seen for a total of 60 minutes in face-to-face genetic counseling.  This patient was discussed with Drs. Magrinat, Lindi Adie and/or Burr Medico who agrees with the above.

## 2017-09-02 NOTE — Therapy (Signed)
[place], [place], [address] Phone: [phone]   Fax:  [phone]  Physical Therapy Evaluation  Patient Details  Name: Hannah Woodard MRN: [id number] Date of Birth: 1971-03-03 Referring Provider: Dr. Autumn Messing   Encounter Date: 09/01/2017  PT End of Session - 09/02/17 1028    Visit Number  1    Number of Visits  1    Date for PT Re-Evaluation  --    PT Start Time  5397    PT Stop Time  1605    PT Time Calculation (min)  25 min    Activity Tolerance  Patient tolerated treatment well    Behavior During Therapy  [facility] for tasks assessed/performed       Past Medical History:  Diagnosis Date  . Family history of ovarian cancer 09/02/2017  . Family history of prostate cancer in father 09/02/2017  . Family history of uterine cancer 09/02/2017  . Plantar fasciitis     History reviewed. No pertinent surgical history.  There were no vitals filed for this visit.   Subjective Assessment - 09/02/17 1022    Subjective  Patient reports she is here today to be seen by her medical team for her newly diagnosed right breast cancer.    Patient is accompained by:  Family member    Pertinent History  Patient was diagnosed on 08/18/17 with right grade 2 invasive ductal carcinoma breast cancer. It measures 1.4 cm and is located in the upper inner quadrant. It is triple positive with a Ki67 of 60%. She has no other health problems.    Patient Stated Goals  reduce lymphedema risk and learn post op shoulder ROM HEP    Currently in Pain?  No/denies         [facility] PT Assessment - 09/02/17 0001      Assessment   Medical Diagnosis  Right breast cancer    Referring Provider  Dr. Autumn Messing    Onset Date/Surgical Date  08/18/17    Hand Dominance  Left    Prior Therapy  none      Precautions   Precautions  Other (comment)    Precaution Comments  active cancer      Restrictions   Weight Bearing Restrictions  No       Balance Screen   Has the patient fallen in the past 6 months  No    Has the patient had a decrease in activity level because of a fear of falling?   No    Is the patient reluctant to leave their home because of a fear of falling?   No      Home Environment   Living Environment  Private residence    Living Arrangements  Spouse/significant other    Available Help at Discharge  Family      Prior Function   Level of Independence  Independent    Vocation  Full time employment    Scientist, research (physical sciences) at [employer]  She walks "some"      Cognition   Overall Cognitive Status  Within Functional Limits for tasks assessed      Posture/Postural Control   Posture/Postural Control  Postural limitations    Postural Limitations  Rounded Shoulders;Forward head      ROM / Strength   AROM / PROM / Strength  AROM;Strength      AROM   AROM Assessment Site  Shoulder;Cervical  Right/Left Shoulder  Right;Left    Right Shoulder Extension  55 Degrees    Right Shoulder Flexion  163 Degrees    Right Shoulder ABduction  171 Degrees    Right Shoulder Internal Rotation  67 Degrees    Right Shoulder External Rotation  84 Degrees    Left Shoulder Extension  46 Degrees    Left Shoulder Flexion  140 Degrees    Left Shoulder ABduction  166 Degrees    Left Shoulder Internal Rotation  80 Degrees    Left Shoulder External Rotation  90 Degrees    Cervical Flexion  WNL    Cervical Extension  WNL    Cervical - Right Side Bend  WNL    Cervical - Left Side Bend  WNL    Cervical - Right Rotation  WNL    Cervical - Left Rotation  WNL      Strength   Overall Strength  Within functional limits for tasks performed        LYMPHEDEMA/ONCOLOGY QUESTIONNAIRE - 09/02/17 1027      Type   Cancer Type  Right breast cancer      Lymphedema Assessments   Lymphedema Assessments  Upper extremities      Right Upper Extremity Lymphedema   10 cm Proximal to Olecranon  Process  26.5 cm    Olecranon Process  24.5 cm    10 cm Proximal to Ulnar Styloid Process  21.8 cm    Just Proximal to Ulnar Styloid Process  15.2 cm    Across Hand at [employer]  17.9 cm    At [place] of 2nd Digit  5.8 cm      Left Upper Extremity Lymphedema   10 cm Proximal to Olecranon Process  27.1 cm    Olecranon Process  23.7 cm    10 cm Proximal to Ulnar Styloid Process  21.1 cm    Just Proximal to Ulnar Styloid Process  15.5 cm    Across Hand at [employer]  17.6 cm    At [place] of 2nd Digit  5.6 cm          Objective measurements completed on examination: See above findings.        Patient was instructed today in a home exercise program today for post op shoulder range of motion. These included active assist shoulder flexion in sitting, scapular retraction, wall walking with shoulder abduction, and hands behind head external rotation.  She was encouraged to do these twice a day, holding 3 seconds and repeating 5 times when permitted by her physician.          PT Education - 09/02/17 1027    Education provided  Yes    Education Details  Lymphedema risk reduction and post op shoulder ROM HEP    Person(s) Educated  Patient;Spouse    Methods  Explanation;Demonstration;Handout    Comprehension  Returned demonstration;Verbalized understanding           Breast Clinic Goals - 09/02/17 1030      Patient will be able to verbalize understanding of pertinent lymphedema risk reduction practices relevant to her diagnosis specifically related to skin care.   Time  1    Period  Days    Status  Achieved      Patient will be able to return demonstrate and/or verbalize understanding of the post-op home exercise program related to regaining shoulder range of motion.   Time  1    Period  Days  Status  Achieved      Patient will be able to verbalize understanding of the importance of attending the postoperative After Breast Cancer Class for further lymphedema  risk reduction education and therapeutic exercise.   Time  1    Period  Days    Status  Achieved            Plan - 09/02/17 1028    Clinical Impression Statement  Patient was diagnosed on 08/18/17 with right grade 2 invasive ductal carcinoma breast cancer. It measures 1.4 cm and is located in the upper inner quadrant. It is triple positive with a Ki67 of 60%. She has no other health problems. Her multidisciplinary medical team met prior to her assessments to determine a recommended treatment plan. She is planning to have neoadjuvant chemotherapy followed by a right lumpectomy and sentinel node biopsy, radiation, and anti-estrogen therapy. She will benefit from a post op PT visit to determine needs and reassess.    History and Personal Factors relevant to plan of care:  None    Clinical Presentation  Stable    Clinical Decision Making  Low    Rehab Potential  Excellent    Clinical Impairments Affecting Rehab Potential  none    PT Frequency  One time visit    PT Treatment/Interventions  ADLs/Self Care Home Management;Therapeutic exercise;Patient/family education    PT Next Visit Plan  Will reassess after surgery if referred by MD    PT Home Exercise Plan  Post op shoulder ROM HEP    Consulted and Agree with Plan of Care  Patient;Family member/caregiver    Family Member Consulted  Husband       Patient will benefit from skilled therapeutic intervention in order to improve the following deficits and impairments:  Decreased knowledge of precautions, Impaired UE functional use, Pain, Decreased range of motion, Postural dysfunction  Visit Diagnosis: Malignant neoplasm of upper-inner quadrant of right breast in female, estrogen receptor positive ([place]) - Plan: PT plan of care cert/re-cert  Abnormal posture - Plan: PT plan of care cert/re-cert   Patient will follow up at outpatient cancer rehab 3-4 weeks following surgery.  If the patient requires physical therapy at that time, a specific  plan will be dictated and sent to the referring physician for approval. The patient was educated today on appropriate basic range of motion exercises to begin post operatively and the importance of attending the After Breast Cancer class following surgery.  Patient was educated today on lymphedema risk reduction practices as it pertains to recommendations that will benefit the patient immediately following surgery.  She verbalized good understanding.      Problem List Patient Active Problem List   Diagnosis Date Noted  . Family history of ovarian cancer 09/02/2017  . Family history of uterine cancer 09/02/2017  . Family history of prostate cancer in father 09/02/2017  . Malignant neoplasm of upper-inner quadrant of right breast in female, estrogen receptor positive ([place]) 08/27/2017  . Vitamin D deficiency 12/09/2015  . Medication management 12/09/2015  . Dense breast tissue 12/09/2015    Annia Friendly, PT 09/02/17 10:32 AM  [place], [place], [address] Phone: [phone]   Fax:  [phone]  Name: MIKYA DON MRN: [id number] Date of Birth: 01-Jan-1971

## 2017-09-02 NOTE — Telephone Encounter (Signed)
Mailed patient calendar of upcoming December appointments.

## 2017-09-02 NOTE — Telephone Encounter (Signed)
Could not leave a message on patients phone. Patient is having genetics appointment so I took the packet and appointment to [facility] in genetics to give to patient this afternoon

## 2017-09-03 ENCOUNTER — Encounter: Payer: Self-pay | Admitting: Genetics

## 2017-09-06 ENCOUNTER — Other Ambulatory Visit: Payer: BC Managed Care – PPO

## 2017-09-06 ENCOUNTER — Other Ambulatory Visit (HOSPITAL_COMMUNITY): Payer: BC Managed Care – PPO

## 2017-09-07 ENCOUNTER — Ambulatory Visit (HOSPITAL_COMMUNITY)
Admission: RE | Admit: 2017-09-07 | Discharge: 2017-09-07 | Disposition: A | Discharge status: Home / Self Care | Payer: BC Managed Care – PPO | Source: Ambulatory Visit | Attending: Oncology | Admitting: Oncology

## 2017-09-07 ENCOUNTER — Encounter: Payer: Self-pay | Admitting: Physician Assistant

## 2017-09-07 DIAGNOSIS — Z170 Estrogen receptor positive status [ER+]: Secondary | ICD-10-CM | POA: Insufficient documentation

## 2017-09-07 DIAGNOSIS — C50211 Malignant neoplasm of upper-inner quadrant of right female breast: Secondary | ICD-10-CM | POA: Diagnosis present

## 2017-09-07 MED ORDER — GADOBENATE DIMEGLUMINE 529 MG/ML IV SOLN
15.0000 mL | Freq: Once | INTRAVENOUS | Status: AC | PRN
  Administered 2017-09-07: 15 mL via INTRAVENOUS

## 2017-09-09 ENCOUNTER — Telehealth: Payer: Self-pay | Admitting: Genetics

## 2017-09-10 ENCOUNTER — Encounter: Payer: Self-pay | Admitting: *Deleted

## 2017-09-10 ENCOUNTER — Telehealth: Payer: Self-pay | Admitting: *Deleted

## 2017-09-10 ENCOUNTER — Ambulatory Visit (HOSPITAL_COMMUNITY)
Admission: RE | Admit: 2017-09-10 | Discharge: 2017-09-10 | Disposition: A | Discharge status: Home / Self Care | Payer: BC Managed Care – PPO | Source: Ambulatory Visit | Attending: Oncology | Admitting: Oncology

## 2017-09-10 DIAGNOSIS — Z170 Estrogen receptor positive status [ER+]: Secondary | ICD-10-CM | POA: Insufficient documentation

## 2017-09-10 DIAGNOSIS — I071 Rheumatic tricuspid insufficiency: Secondary | ICD-10-CM | POA: Diagnosis not present

## 2017-09-10 DIAGNOSIS — C50211 Malignant neoplasm of upper-inner quadrant of right female breast: Secondary | ICD-10-CM | POA: Insufficient documentation

## 2017-09-10 NOTE — Progress Notes (Signed)
  Echocardiogram 2D Echocardiogram has been performed.  Darlina Sicilian M 09/10/2017, 9:44 AM

## 2017-09-10 NOTE — Telephone Encounter (Signed)
12/13: Revealed negative genetic testing for the prelimiary high risk breast cancer genes.  I asked her if she would like reflex testing and said she would like reflex testing to the Hereiditary Cancer Panel +Melanoma Panel. This normal result is reassuring and indicates that it is unlikely Ms. Chatmon's cancer is due to a hereditary cause. However, we will need to wait for the full results to complete her risk assessment.  These however, are the results that would likely change her breast surgical decision, and there were no genetic indications to recommend considering bilateral mastectomy for genetic/hereditary predisposition reasons.  12/14: Revealed negative genetic testing results for the full panel of genes (51)- Common Hereditary Cancer Panel + Melanoma.  Explained that this is reassuring for her and there there are no genetic findings to change her medical management.  She may be at a slightly higher risk for ovarian cancer simply given the family history and should take this into consideration when discussing her ovarian surgical decisions with her doctors. (she reports she is considering ablation/removal of ovaries to avoid taking tamoxifen), but no genetic testing results indicate recommending removal of her ovaries.  We discussed that her siblings and maternal relatives are recommended to also have genetic testing due to the family history of ovarian cancer. She reports that she her sister did have testing for the BRCA1 and 2 genes,b ut is not sure if she had a panel or how recent her testing. Was.  If it was more than about 5 years ago then she might not have had a panel/the most up to date testing.  Ms. Yust understands that there results are reassuring, but cannot definitively rule out a hereditary predisposition to cancer. We recommend she check in annually to determine if there is any new updated genetic testing recommended for her.

## 2017-09-10 NOTE — Telephone Encounter (Signed)
Left vm regarding [facility] from 09/01/17

## 2017-09-15 ENCOUNTER — Encounter: Payer: Self-pay | Admitting: Genetics

## 2017-09-15 ENCOUNTER — Ambulatory Visit: Payer: Self-pay | Admitting: Genetics

## 2017-09-15 DIAGNOSIS — Z8042 Family history of malignant neoplasm of prostate: Secondary | ICD-10-CM

## 2017-09-15 DIAGNOSIS — Z8041 Family history of malignant neoplasm of ovary: Secondary | ICD-10-CM

## 2017-09-15 DIAGNOSIS — Z1379 Encounter for other screening for genetic and chromosomal anomalies: Secondary | ICD-10-CM

## 2017-09-15 DIAGNOSIS — Z8049 Family history of malignant neoplasm of other genital organs: Secondary | ICD-10-CM

## 2017-09-15 DIAGNOSIS — C50211 Malignant neoplasm of upper-inner quadrant of right female breast: Secondary | ICD-10-CM

## 2017-09-15 DIAGNOSIS — Z170 Estrogen receptor positive status [ER+]: Secondary | ICD-10-CM

## 2017-09-15 NOTE — Progress Notes (Signed)
HPI: Hannah Woodard was previously seen in the [place] clinic on 09/02/2017 due to a personal and family history of cancer and concerns regarding a hereditary predisposition to cancer. Please refer to our prior cancer genetics clinic note for more information regarding Hannah Woodard's medical, social and family histories, and our assessment and recommendations, at the time. Hannah Woodard recent genetic test results were disclosed to her, as well as recommendations warranted by these results. These results and recommendations are discussed in more detail below.  CANCER HISTORY:    Malignant neoplasm of upper-inner quadrant of right breast in female, estrogen receptor positive ([place])   08/27/2017 Initial Diagnosis    Malignant neoplasm of upper-inner quadrant of right breast in female, estrogen receptor positive ([place])      09/10/2017 Genetic Testing    The patient had genetic testing due to a personal history of breast cancer and a family history of ovarian, prostate, skin, and uterine cancer.  The Common Hereditary Cancer Panel + Melanoma Panel was ordered (51 genes total).  The following genes were evaluated for sequence changes and exonic deletions/duplications: APC, ATM, AXIN2, BAP1, BARD1, BMPR1A, BRCA1, BRCA2, BRIP1, CDH1, CDK4, CDKN2A (p14ARF), CDKN2A (p16INK4a), CHEK2, CTNNA1, DICER1, EPCAM*, GREM1*, KIT, MEN1, MLH1, MSH2, MSH3, MSH6, MUTYH, NBN, NF1, PALB2, PDGFRA, PMS2, POLD1, POLE, POT1, PTEN, RAD50, RAD51C, RAD51D, RB1, SDHB, SDHC, SDHD, SMAD4, SMARCA4, STK11, TP53, TSC1, TSC2, VHL. The following genes were evaluated for sequence changes only: HOXB13*, MITF*, NTHL1*, SDHA  Results: Negative, no pathogenic variants identified.  The date of this test report is 09/10/2017.        FAMILY HISTORY:  We obtained a detailed, 4-generation family history.  Significant diagnoses are listed below: Family History  Problem Relation Age of Onset  . Ovarian cancer Mother 88  . Prostate  cancer Father 46       'high gleason' unsure number  . Ulcerative colitis Sister   . Other Sister        abn ovaian growth, partial hysterectomy  . Uterine cancer Maternal Aunt 43  . Stroke Maternal Aunt 66  . Basal cell carcinoma Brother   . Skin cancer Paternal Uncle   . Skin cancer Maternal Grandmother   . Stroke Paternal Grandfather 38  . Hydrocephalus Cousin    Hannah Woodard does not have any children.  She has 1 brother who has had basal cell carcinoma and 3 other moles removed (unsure if cancer).  She also has a sister who is 67.  She had a partial hysterectomy and her ovaries had an abnormal growth (one was larger than the other) but it was not cancer. This sister has 4 children with no history of cancer.    Hannah Woodard father died at 30 due to alcohol induced cirrhosis.  He was diagnosed with prostate cancer at 21, she reports his gleason score was high, but does not remember the number.  Hannah Woodard has a paternal uncle who is deceased and had skin cancer on his arm.  He was a Pharmacist, community and this may have been due to sun exposure.  He had 5 children with no history of cancer.  Hannah Woodard also has a 22 year-old paternal aunt who has no history of cancer. She has 2 children, one died at 71 in an accident and the other is 53 with no history of cancer.  Hannah Woodard paternal grandfather died at 65 and had some type of skin cancer in his lifetime.  Her paternal grandmother died at  92 of pneumonia.    Hannah Woodard mother died at 39 of ovarian cancer diagnosed at 48.  Hannah Woodard has a maternal aunt who was diagnosed with uterine cancer in her 61's and had a stroke at 57.  This aunt is now in her 13's.  Hannah Woodard has 1 maternal cousin who is 8.  He was born with hydrocephalus and is blind.  Hannah Woodard maternal grandmother died at 82 and had a stroke at age 62.  Hannah Woodard maternal grandfather died at 20 with no history of cancer.  His mother (patient's GGM) died of a uterine issue,  unsure if it was cancer.     Hannah Woodard is unaware of previous family history of genetic testing for hereditary cancer risks. Patient's maternal ancestors are of [place]/German descent, and paternal ancestors are of Northern European descent. There is no reported Ashkenazi Jewish ancestry. There is no known consanguinity.  GENETIC TEST RESULTS: Genetic testing performed through [facility] Common Hereditary Cancers Panel + Melanoma Panel reported out on 09/10/2017 showed no pathogenic mutations.  The following genes were evaluated for sequence changes and exonic deletions/duplications: APC, ATM, AXIN2, BAP1, BARD1, BMPR1A, BRCA1, BRCA2, BRIP1, CDH1, CDK4, CDKN2A (p14ARF), CDKN2A (p16INK4a), CHEK2, CTNNA1, DICER1, EPCAM*, GREM1*, KIT, MEN1, MLH1, MSH2, MSH3, MSH6, MUTYH, NBN, NF1, PALB2, PDGFRA, PMS2, POLD1, POLE, POT1, PTEN, RAD50, RAD51C, RAD51D, RB1, SDHB, SDHC, SDHD, SMAD4, SMARCA4, STK11, TP53, TSC1, TSC2, VHL The following genes were evaluated for sequence changes only:HOXB13*, MITF*,  NTHL1*, SDHA  The test report will be scanned into EPIC and will be located under the Molecular Pathology section of the Results Review tab.A portion of the result report is included below for reference.     We discussed with Hannah Woodard that because current genetic testing is not perfect, it is possible there may be a gene mutation in one of these genes that current testing cannot detect, but that chance is small. We also discussed, that there could be another gene that has not yet been discovered, or that we have not yet tested, that is responsible for the cancer diagnoses in the family. It is also possible there is a hereditary cause for the cancer in the family that Hannah Woodard did not inherit and therefore was not identified in her testing.  Therefore, it is important to remain in touch with cancer genetics in the future so that we can continue to offer Hannah Woodard the most up to date genetic testing.    ADDITIONAL GENETIC TESTING: We discussed with Hannah Woodard that there are other genes that are associated with increased cancer risk that can be analyzed. The laboratories that offer this testing look at these additional genes via a hereditary cancer gene panel. Should Ms. Balbach wish to pursue additional genetic testing, we are happy to discuss and coordinate this testing, at any time.    CANCER SCREENING RECOMMENDATIONS: This negative result means that we were unable to identify a hereditary cause for her personal and family history of cancer at this time.  While reassuring, this result does not rule out a hereditary basis for her cancer.  It is still possible that there could be genetic mutations that are undetectable by current technology, or genetic mutations in genes that have not been tested or identified to increase cancer risk.  Therefore, it is recommended she continue to follow the cancer management and screening guidelines provided by her oncology and primary healthcare provider. Other factors such as her personal and family history may still affect  her cancer risk.  We discussed that she is still at a somewhat increased risk to develop ovarian cancer simply given the family history.  Ms. Cranmer reports she is considering ablation and/or removing her ovaries and having discussions with doctors about this.  There are no genetic findings to recommend removing her ovaries, but her increased risk due to family history should be considered in this decision.  I recommend she discuss these options with her oncology/gynecology/and fertility physicians.    RECOMMENDATIONS FOR FAMILY MEMBERS: Women in this family might be at some increased risk of developing cancer, over the general population risk, simply due to the family history of cancer. We recommended women in this family have a yearly mammogram beginning at age 56, or 85 years younger than the earliest onset of cancer, an annual clinical  breast exam, and perform monthly breast self-exams. Women in this family should also have a gynecological exam as recommended by their primary provider. All family members should have a colonoscopy by age 44.  All family members should inform their physicians about the family history of cancer so their doctors can make the most appropriate screening recommendations for them.   Based on Ms. Saab's family history, we recommended siblings and all maternal relatives, have genetic counseling and testing. Ms. Tellado will let us know if we can be of any assistance in coordinating genetic counseling and/or testing for these family members.   FOLLOW-UP: Lastly, we discussed with Ms. Doner that cancer genetics is a rapidly advancing field and it is possible that new genetic tests will be appropriate for her and/or her family members in the future. We encouraged her to remain in contact with cancer genetics on an annual basis so we can update her personal and family histories and let her know of advances in cancer genetics that may benefit this family.   Our contact number was provided. Ms. Mergen questions were answered to her satisfaction, and she knows she is welcome to call us at anytime with additional questions or concerns.   Ferol Luz, MS Genetic Counselor [email or web address]

## 2017-09-17 ENCOUNTER — Ambulatory Visit (HOSPITAL_BASED_OUTPATIENT_CLINIC_OR_DEPARTMENT_OTHER): Payer: BC Managed Care – PPO | Admitting: Oncology

## 2017-09-17 ENCOUNTER — Other Ambulatory Visit: Payer: BC Managed Care – PPO

## 2017-09-17 ENCOUNTER — Telehealth: Payer: Self-pay | Admitting: Oncology

## 2017-09-17 VITALS — BP 130/74 | HR 73 | Temp 97.9°F | Resp 17 | Ht 66.0 in | Wt 164.3 lb

## 2017-09-17 DIAGNOSIS — Z170 Estrogen receptor positive status [ER+]: Secondary | ICD-10-CM

## 2017-09-17 DIAGNOSIS — C50211 Malignant neoplasm of upper-inner quadrant of right female breast: Secondary | ICD-10-CM | POA: Diagnosis not present

## 2017-09-17 NOTE — Telephone Encounter (Signed)
Gave patient AVS and calendar of upcoming January 2019 appointments.

## 2017-09-17 NOTE — Progress Notes (Signed)
[place]  [phone] [phone]) [phone]     ID: Hannah Woodard DOB: January 08, 1971  MR#: [id number]  ZHY#:[id number]  Patient Care Team: Unk Pinto, MD as PCP - General (Internal Medicine) Jovita Kussmaul, MD as Consulting Physician (General Surgery) Magrinat, Virgie Dad, MD as Consulting Physician (Oncology) Kyung Rudd, MD as Consulting Physician (Radiation Oncology) Lorelle Gibbs, MD (Radiology) OTHER MD:  CHIEF COMPLAINT: Triple positive breast cancer  CURRENT TREATMENT: Awaiting definitive surgery   HISTORY OF CURRENT ILLNESS: From the original intake note:  The patient has a history of cysts and nodules in the right breast which have been closely followed, with exams 07/30/2016 and 01/28/2017.  On 07/29/2017 follow-up right breast ultrasonography showed no findings of concern.  Bilateral diagnostic mammography 08/18/2017 with right breast ultrasonography on 08/18/2017 at [facility] found the breast density to be category C.  There was now a new irregular high density mass in the posterior portion of the right breast seen on the mediolateral oblique view only.  Ultrasonography confirmed a 1.4 cm lobulated mass in the upper inner quadrant of the right breast 10 cm from the nipple associated with pectoral muscle invasion.  The right axilla was sonographically benign.  Biopsy of this mass 08/24/2017 showed ([id number]) invasive ductal carcinoma, grade 2, estrogen receptor 95% positive, progesterone receptor 100% positive, both with strong staining intensity, with an MIB-1 of 60%, and HER-2 amplified, with a signals ratio of 2.84 (full report pending).  The patient's subsequent history is as detailed below.  INTERVAL HISTORY: Hannah Woodard returns today for follow-up and treatment of her triple positive breast cancer. She is accompanied by her husband, Catalina Antigua and her friend, Mearl Latin.   On 09/07/2017, she underwent a Bilateral Breast MRI with and without  contrast.  Breast density was category C. There was no evidence of malignancy to her left breast. In the right upper inner breast the known malignant mass measured 2.2 cm.  There was a fat plane between the mass and the pectoralis.  There were no abnormal appearing lymph nodes.  Since her last visit here Hannah Woodard has established herself with Dr. Alvan Dame at [facility] and has a tentative surgical date of 10/07/2017.  She is still unsure whether she wants to have a lumpectomy, unilateral mastectomy with contralateral mastopexy, or bilateral mastectomies.  The plan is for her to have a port placed at the time of definitive surgery   REVIEW OF SYSTEMS: Hannah Woodard needs paperwork filled out so she can get approved time off from work for her treatment. One of her biggest concerns at this time is experiencing nausea and she is interested in taking CBD oil. She is also worried about how the treatment will effect her heart. She is hoping to avoid radiation by getting a mastectomy. She would like to freeze her ovaries and put herself in menopause throughout treatment. She has noticed that her period has been getting lighter and thinks she might be getting close to menopause naturally. She denies unusual headaches, visual changes, nausea, vomiting, or dizziness. There has been no unusual cough, phlegm production, or pleurisy. This been no change in bowel or bladder habits. She denies unexplained fatigue or unexplained weight loss, bleeding, rash, or fever. A detailed review of systems was otherwise entirely stable.   PAST MEDICAL HISTORY: Past Medical History:  Diagnosis Date  . Breast cancer ([place])   . Family history of ovarian cancer 09/02/2017  . Family history of prostate cancer in father 09/02/2017  . Family history  of uterine cancer 09/02/2017  . Plantar fasciitis     PAST SURGICAL HISTORY: No past surgical history on file.  FAMILY HISTORY Family History  Problem Relation Age of Onset  . Ovarian cancer Mother  40  . Prostate cancer Father 16       'high gleason' unsure number  . Ulcerative colitis Sister   . Other Sister        abn ovaian growth, partial hysterectomy  . Uterine cancer Maternal Aunt 6  . Stroke Maternal Aunt 66  . Basal cell carcinoma Brother   . Skin cancer Paternal Uncle   . Skin cancer Maternal Grandmother   . Stroke Paternal Grandfather 56  . Hydrocephalus Cousin   The patient's father died from alcoholic cirrhosis at age 46.  He had been diagnosed with prostate cancer at age 78.  The patient's mother was diagnosed with ovarian cancer at age 34 and died a year later.  The patient has 1 brother, 1 sister.  The patient's brother has had basal cell and other skin cancers.  A paternal aunt had uterine cancer.  Other relatives have had skin cancers but the patient does not know if these were melanomas or not  GYNECOLOGIC HISTORY:  No LMP recorded.  Menarche age 43, the patient has never been pregnant.  She is still having regular periods.  She used oral contraceptives briefly in the past without complications.  She and her husband are very interested in having children, and currently (December 2018) they are not using any contraception.   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  Her husband Rodman Key (goes by [employer]" is a Dealer.  At home it's just them, a [employer] and 2 cats.     ADVANCED DIRECTIVES:    HEALTH MAINTENANCE: Social History   Tobacco Use  . Smoking status: Never Smoker  . Smokeless tobacco: Never Used  Substance Use Topics  . Alcohol use: Yes  . Drug use: No     Colonoscopy: Never  PAP:  Bone density: Never   Allergies  Allergen Reactions  . Milk-Related Compounds Diarrhea    Stomach cramping  . Poison Ivy Extract [Poison Ivy Extract] Hives, Itching and Rash    Current Outpatient Medications  Medication Sig Dispense Refill  . aspirin 81 MG tablet Take 81 mg by  mouth daily.    Marland Kitchen b complex vitamins tablet Take 1 tablet by mouth daily.    . Cholecalciferol (VITAMIN D) 2000 UNITS tablet Take 10,000 Units by mouth daily.     . ciprofloxacin (CIPRO) 500 MG tablet Take 1 tablet (500 mg total) by mouth 2 (two) times daily. 14 tablet 0  . fish oil-omega-3 fatty acids 1000 MG capsule Take 1 g by mouth daily.    Nyoka Cowden Tea 250 MG CAPS Take by mouth 2 (two) times daily.    . hyoscyamine (LEVSIN SL) 0.125 MG SL tablet Take 1 to 2 tablets 3 to 4 x day if needed for Nausea, vomiting, cramping or diarrhea 90 tablet 0  . Iodine Strong, Lugols, (STRONG IODINE) 5 % solution Take 0.2 mLs by mouth 3 (three) times daily. 480 mL 0  . ondansetron (ZOFRAN ODT) 8 MG disintegrating tablet 50m ODT q6 hours prn nausea 30 tablet 0  . phenazopyridine (PYRIDIUM) 200 MG tablet Take 1 tablet (200 mg total) by mouth 3 (three) times daily. 6 tablet 0   No current facility-administered medications for this visit.  OBJECTIVE: Young white woman in no acute distress  Vitals:   09/17/17 1109  BP: 130/74  Pulse: 73  Resp: 17  Temp: 97.9 F (36.6 C)  SpO2: 100%     Body mass index is 26.52 kg/m.   Wt Readings from Last 3 Encounters:  09/17/17 164 lb 4.8 oz (74.5 kg)  09/01/17 166 lb 1.6 oz (75.3 kg)  05/18/17 169 lb (76.7 kg)      ECOG FS:0 - Asymptomatic  Sclerae unicteric, pupils round and equal Oropharynx clear and moist No cervical or supraclavicular adenopathy Lungs no rales or rhonchi Heart regular rate and rhythm Abd soft, nontender, positive bowel sounds MSK no focal spinal tenderness, no upper extremity lymphedema Neuro: nonfocal, well oriented, appropriate affect Breasts: I do not palpate a mass in the right breast.  There are no skin or nipple changes of concern.  The left breast is benign.  Both axillae are benign.  LAB RESULTS:  CMP     Component Value Date/Time   NA 138 09/01/2017 1238   K 4.4 09/01/2017 1238   CL 107 05/18/2017 0928   CO2 25  09/01/2017 1238   GLUCOSE 83 09/01/2017 1238   BUN 9.2 09/01/2017 1238   CREATININE 0.7 09/01/2017 1238   CALCIUM 9.4 09/01/2017 1238   PROT 7.4 09/01/2017 1238   ALBUMIN 4.2 09/01/2017 1238   AST 21 09/01/2017 1238   ALT 21 09/01/2017 1238   ALKPHOS 60 09/01/2017 1238   BILITOT 0.65 09/01/2017 1238   GFRNONAA 111 05/18/2017 0928   GFRAA 128 05/18/2017 0928    No results found for: TOTALPROTELP, ALBUMINELP, A1GS, A2GS, BETS, BETA2SER, GAMS, MSPIKE, SPEI  No results found for: Nils Pyle, [facility]  Lab Results  Component Value Date   WBC 7.0 09/01/2017   NEUTROABS 4.8 09/01/2017   HGB 13.7 09/01/2017   HCT 41.0 09/01/2017   MCV 98.8 09/01/2017   PLT 273 09/01/2017      Chemistry      Component Value Date/Time   NA 138 09/01/2017 1238   K 4.4 09/01/2017 1238   CL 107 05/18/2017 0928   CO2 25 09/01/2017 1238   BUN 9.2 09/01/2017 1238   CREATININE 0.7 09/01/2017 1238      Component Value Date/Time   CALCIUM 9.4 09/01/2017 1238   ALKPHOS 60 09/01/2017 1238   AST 21 09/01/2017 1238   ALT 21 09/01/2017 1238   BILITOT 0.65 09/01/2017 1238       No results found for: LABCA2  No components found for: [id number]  No results for input(s): INR in the last 168 hours.  No results found for: LABCA2  No results found for: NOT771  No results found for: HAF790  No results found for: XYB338  No results found for: CA2729  No components found for: HGQUANT  No results found for: CEA1 / No results found for: CEA1   No results found for: AFPTUMOR  No results found for: CHROMOGRNA  No results found for: PSA1  No visits with results within 3 Day(s) from this visit.  Latest known visit with results is:  Hospital Outpatient Visit on 09/02/2017  Component Date Value Ref Range Status  . Preg Test, Ur 09/02/2017 NEGATIVE  NEGATIVE Final   Comment:        THE SENSITIVITY OF THIS METHODOLOGY IS >20 mIU/mL.     (this displays the last labs from the  last 3 days)  No results found for: TOTALPROTELP, ALBUMINELP, A1GS, A2GS, BETS, BETA2SER, GAMS, MSPIKE,  SPEI (this displays SPEP labs)  No results found for: KPAFRELGTCHN, LAMBDASER, KAPLAMBRATIO (kappa/lambda light chains)  No results found for: HGBA, HGBA2QUANT, HGBFQUANT, HGBSQUAN (Hemoglobinopathy evaluation)   No results found for: LDH  Lab Results  Component Value Date   IRON 128 12/16/2016   TIBC 355 12/16/2016   IRONPCTSAT 36 12/16/2016   (Iron and TIBC)  Lab Results  Component Value Date   FERRITIN 67 12/16/2016    Urinalysis    Component Value Date/Time   COLORURINE YELLOW 06/16/2017 1442   APPEARANCEUR CLEAR 06/16/2017 1442   LABSPEC 1.020 06/16/2017 1442   PHURINE 6.5 06/16/2017 1442   GLUCOSEU NEGATIVE 06/16/2017 1442   HGBUR 2+ (A) 06/16/2017 1442   BILIRUBINUR NEGATIVE 05/18/2017 0928   KETONESUR NEGATIVE 06/16/2017 1442   PROTEINUR NEGATIVE 06/16/2017 1442   UROBILINOGEN 0.2 12/06/2014 1529   NITRITE NEGATIVE 06/16/2017 1442   LEUKOCYTESUR NEGATIVE 06/16/2017 1442     STUDIES: Mr Breast Bilateral W Wo Contrast  Result Date: 09/08/2017 CLINICAL DATA:  46 year old female recently diagnosed with grade 2 invasive ductal carcinoma at the 1 o'clock position in the right breast presenting for MRI prior to neoadjuvant chemotherapy. She has history of benign biopsies of both breasts previously. She has family history of ovarian cancer in her mother at age 19. LABS:  Not applicable. EXAM: BILATERAL BREAST MRI WITH AND WITHOUT CONTRAST TECHNIQUE: Multiplanar, multisequence MR images of both breasts were obtained prior to and following the intravenous administration of 15 ml of MultiHance. THREE-DIMENSIONAL MR IMAGE RENDERING ON INDEPENDENT WORKSTATION: Three-dimensional MR images were rendered by post-processing of the original MR data on an independent workstation. The three-dimensional MR images were interpreted, and findings are reported in the following  complete MRI report for this study. Three dimensional images were evaluated at the independent DynaCad workstation COMPARISON:  No prior MRI available for comparison. Correlation made with prior mammograms and ultrasounds. FINDINGS: Breast composition: c. Heterogeneous fibroglandular tissue. Background parenchymal enhancement: Moderate. Right breast: In the upper inner quadrant of the right breast, far posterior depth, there is an irregular enhancing mass associated with susceptibility artifact from the biopsy marking clip, compatible with the biopsy proven malignancy. This mass measures 2.2 x 1.7 x 1.9 cm. There is a preserved fat plane between the mass and the pectoralis muscle. Left breast: No mass or abnormal enhancement. Lymph nodes: No abnormal appearing lymph nodes. Ancillary findings:  None. IMPRESSION: 1. Known malignancy in the upper inner right breast measuring 2.2 cm. There is a preserved fat plane between the mass and the pectoralis muscle. 2.  No MRI evidence of left breast malignancy. RECOMMENDATION: Continue treatment plan. BI-RADS CATEGORY  6: Known biopsy-proven malignancy. Electronically Signed   By: Ammie Ferrier M.D.   On: 09/08/2017 17:06    ELIGIBLE FOR AVAILABLE RESEARCH PROTOCOL: No  ASSESSMENT: 46 y.o. [place] woman status post right breast upper inner quadrant biopsy 08/24/2017 for a clinical T1c N0 invasive ductal carcinoma, triple positive, with an MIB-1 of 60%  (1) genetics testing 09/10/2017 through the Common Hereditary Cancer Panel + Melanoma Panel found no deleterious mutations in: APC, ATM, AXIN2, BAP1, BARD1, BMPR1A, BRCA1, BRCA2, BRIP1, CDH1, CDK4, CDKN2A (p14ARF), CDKN2A (p16INK4a), CHEK2, CTNNA1, DICER1, EPCAM*, GREM1*, KIT, MEN1, MLH1, MSH2, MSH3, MSH6, MUTYH, NBN, NF1, PALB2, PDGFRA, PMS2, POLD1, POLE, POT1, PTEN, RAD50, RAD51C, RAD51D, RB1, SDHB, SDHC, SDHD, SMAD4, SMARCA4, STK11, TP53, TSC1, TSC2, VHL. The following genes were evaluated for sequence changes  only: HOXB13*, MITF*, NTHL1*, SDHA  (2) definitive surgery tentatively scheduled for  10/07/2017  (3) adjuvant chemotherapy to consist of paclitaxel weekly x12 and trastuzumab   (4) continue trastuzumab to complete a year  (a) baseline echocardiogram 09/10/2017 shows an ejection fraction of 55-60%  (5) adjuvant radiation to follow therapy if the patient has lumpectomy.  (6) adjuvant antiestrogens to follow at the completion of local treatment  (7) goserelin for fertility preservation to start 09/29/2017  PLAN: I spent just under an hour today with Hannah Woodard and her family going over her situation.  None that we know that her genetics are negative she has more options as far as surgery is concerned.  She is well aware that there is no survival advantage to mastectomy as compared to lumpectomy plus radiation.  What is driving her to mastectomy is the desire to avoid radiation.  We discussed the fact that lumpectomy without radiation is not equivalent to mastectomy, is not adequate, and is not standard.  She has a good understanding of all these issues and is still in the process of making up her mind.  She tells me she is interested in fertility preservation so we are going to start go [facility] 09/29/2017.  A second reason for her to go on go Sharl Ma is the desire to avoid tamoxifen.  She thinks anastrozole may be easier on her.  She understands she will have a period after her first goserelin dose but not after subsequent doses.  She will see me again 10/28/2017, which will be her second goserelin dose.  At that time we will review her surgical results and plan to start her chemotherapy most likely November 02, 2017  She knows to call for any other problems that may develop before then.  Magrinat, Virgie Dad, MD  09/17/17 11:34 AM Medical Oncology and Hematology [facility] [address] [place], [place] [address] Tel. [phone]    Fax. [phone]  This document serves as a  record of services personally performed by Chauncey Cruel, MD. It was created on his behalf by Margit Banda, a trained medical scribe. The creation of this record is based on the scribe's personal observations and the provider's statements to them.  I have reviewed the above documentation for accuracy and completeness, and I agree with the above.

## 2017-09-17 NOTE — Progress Notes (Signed)
START OFF PATHWAY REGIMEN - Breast   [id number]:Paclitaxel + Trastuzumab:   A cycle is every 28 days:     Paclitaxel      Trastuzumab      Trastuzumab   **Always confirm dose/schedule in your pharmacy ordering system**    Patient Characteristics: Postoperative without Neoadjuvant Therapy (Pathologic Staging), Invasive Disease, Adjuvant Therapy, HER2 Positive, ER Positive, Node Negative, pT2 or Higher Therapeutic Status: Postoperative without Neoadjuvant Therapy (Pathologic Staging) AJCC Grade: G2 AJCC N Category: pN0 AJCC M Category: cM0 ER Status: Positive (+) AJCC 8 Stage Grouping: IA HER2 Status: Positive (+) Oncotype Dx Recurrence Score: Not Appropriate AJCC T Category: pT2 PR Status: Positive (+) Intent of Therapy: Curative Intent, Discussed with Patient

## 2017-09-20 ENCOUNTER — Telehealth: Payer: Self-pay | Admitting: Oncology

## 2017-09-20 NOTE — Telephone Encounter (Signed)
Called regarding 12/21 sch msg

## 2017-09-27 ENCOUNTER — Encounter: Payer: Self-pay | Admitting: Oncology

## 2017-09-27 ENCOUNTER — Telehealth: Payer: Self-pay | Admitting: Oncology

## 2017-09-27 NOTE — Telephone Encounter (Signed)
Spoke to patient regarding upcoming February through March appointments per 12/27 sch message

## 2017-09-29 ENCOUNTER — Ambulatory Visit (HOSPITAL_BASED_OUTPATIENT_CLINIC_OR_DEPARTMENT_OTHER): Payer: BC Managed Care – PPO

## 2017-09-29 DIAGNOSIS — Z170 Estrogen receptor positive status [ER+]: Principal | ICD-10-CM

## 2017-09-29 DIAGNOSIS — C50211 Malignant neoplasm of upper-inner quadrant of right female breast: Secondary | ICD-10-CM | POA: Diagnosis not present

## 2017-09-29 DIAGNOSIS — Z5111 Encounter for antineoplastic chemotherapy: Secondary | ICD-10-CM

## 2017-09-29 MED ORDER — GOSERELIN ACETATE 3.6 MG ~~LOC~~ IMPL
3.6000 mg | DRUG_IMPLANT | Freq: Once | SUBCUTANEOUS | Status: AC
  Administered 2017-09-29: 3.6 mg via SUBCUTANEOUS
  Filled 2017-09-29: qty 3.6

## 2017-09-29 NOTE — Patient Instructions (Signed)
Goserelin injection What is this medicine? GOSERELIN (GOE se rel in) is similar to a hormone found in the body. It lowers the amount of sex hormones that the body makes. Men will have lower testosterone levels and women will have lower estrogen levels while taking this medicine. In men, this medicine is used to treat prostate cancer; the injection is either given once per month or once every 12 weeks. A once per month injection (only) is used to treat women with endometriosis, dysfunctional uterine bleeding, or advanced breast cancer. This medicine may be used for other purposes; ask your health care provider or pharmacist if you have questions. COMMON BRAND NAME(S): Zoladex What should I tell my health care provider before I take this medicine? They need to know if you have any of these conditions (some only apply to women): -diabetes -heart disease or previous heart attack -high blood pressure -high cholesterol -kidney disease -osteoporosis or low bone density -problems passing urine -spinal cord injury -stroke -tobacco smoker -an unusual or allergic reaction to goserelin, hormone therapy, other medicines, foods, dyes, or preservatives -pregnant or trying to get pregnant -breast-feeding How should I use this medicine? This medicine is for injection under the skin. It is given by a health care professional in a hospital or clinic setting. Men receive this injection once every 4 weeks or once every 12 weeks. Women will only receive the once every 4 weeks injection. Talk to your pediatrician regarding the use of this medicine in children. Special care may be needed. Overdosage: If you think you have taken too much of this medicine contact a poison control center or emergency room at once. NOTE: This medicine is only for you. Do not share this medicine with others. What if I miss a dose? It is important not to miss your dose. Call your doctor or health care professional if you are unable to  keep an appointment. What may interact with this medicine? -female hormones like estrogen -herbal or dietary supplements like black cohosh, chasteberry, or DHEA -female hormones like testosterone -prasterone This list may not describe all possible interactions. Give your health care provider a list of all the medicines, herbs, non-prescription drugs, or dietary supplements you use. Also tell them if you smoke, drink alcohol, or use illegal drugs. Some items may interact with your medicine. What should I watch for while using this medicine? Visit your doctor or health care professional for regular checks on your progress. Your symptoms may appear to get worse during the first weeks of this therapy. Tell your doctor or healthcare professional if your symptoms do not start to get better or if they get worse after this time. Your bones may get weaker if you take this medicine for a long time. If you smoke or frequently drink alcohol you may increase your risk of bone loss. A family history of osteoporosis, chronic use of drugs for seizures (convulsions), or corticosteroids can also increase your risk of bone loss. Talk to your doctor about how to keep your bones strong. This medicine should stop regular monthly menstration in women. Tell your doctor if you continue to [facility]. Women should not become pregnant while taking this medicine or for 12 weeks after stopping this medicine. Women should inform their doctor if they wish to become pregnant or think they might be pregnant. There is a potential for serious side effects to an unborn child. Talk to your health care professional or pharmacist for more information. Do not breast-feed an infant while taking  this medicine. Men should inform their doctors if they wish to father a child. This medicine may lower sperm counts. Talk to your health care professional or pharmacist for more information. What side effects may I notice from receiving this  medicine? Side effects that you should report to your doctor or health care professional as soon as possible: -allergic reactions like skin rash, itching or hives, swelling of the face, lips, or tongue -bone pain -breathing problems -changes in vision -chest pain -feeling faint or lightheaded, falls -fever, chills -pain, swelling, warmth in the leg -pain, tingling, numbness in the hands or feet -signs and symptoms of low blood pressure like dizziness; feeling faint or lightheaded, falls; unusually weak or tired -stomach pain -swelling of the ankles, feet, hands -trouble passing urine or change in the amount of urine -unusually high or low blood pressure -unusually weak or tired Side effects that usually do not require medical attention (report to your doctor or health care professional if they continue or are bothersome): -change in sex drive or performance -changes in breast size in both males and females -changes in emotions or moods -headache -hot flashes -irritation at site where injected -loss of appetite -skin problems like acne, dry skin -vaginal dryness This list may not describe all possible side effects. Call your doctor for medical advice about side effects. You may report side effects to FDA at 1-800-FDA-1088. Where should I keep my medicine? This drug is given in a hospital or clinic and will not be stored at home. NOTE: This sheet is a summary. It may not cover all possible information. If you have questions about this medicine, talk to your doctor, pharmacist, or health care provider.  2018 Elsevier/Gold Standard (2013-11-21 11:10:35)

## 2017-10-06 ENCOUNTER — Telehealth: Payer: Self-pay

## 2017-10-06 NOTE — Telephone Encounter (Signed)
VM received today from Vangie Bicker, RN with [facility] was introducing herself in the event we need to contact her. [phone]

## 2017-10-12 ENCOUNTER — Encounter: Payer: Self-pay | Admitting: Physician Assistant

## 2017-10-13 ENCOUNTER — Other Ambulatory Visit: Payer: BC Managed Care – PPO

## 2017-10-13 ENCOUNTER — Other Ambulatory Visit: Payer: Self-pay | Admitting: Physician Assistant

## 2017-10-13 MED ORDER — TRIAMCINOLONE ACETONIDE 0.1 % EX OINT: 1.0000 "application " | TOPICAL_OINTMENT | Freq: Two times a day (BID) | CUTANEOUS | 1 refills | Status: DC

## 2017-10-20 ENCOUNTER — Telehealth: Payer: Self-pay | Admitting: *Deleted

## 2017-10-20 NOTE — Telephone Encounter (Signed)
  Oncology Nurse Navigator Documentation  Navigator Location: [facility] (10/20/17 1200)   )Navigator Encounter Type: Telephone (10/20/17 1200) Telephone: [facility] Crocker Call;Appt Confirmation/Clarification;FMLA/Disability (10/20/17 1200)     Surgery Date: 10/07/17(at [facility]) (10/20/17 1200)       Multidisiplinary Clinic Date: 09/01/17 (10/20/17 1200) Multidisiplinary Clinic Type: Breast (10/20/17 1200) Treatment Initiated Date: 10/07/17 (10/20/17 1200)                      Acuity: Level 2 (10/20/17 1200)         Time Spent with Patient: 30 (10/20/17 1200)

## 2017-10-25 NOTE — Progress Notes (Signed)
[place]  [phone] Fax:([phone]) [phone]     ID: Hannah Woodard DOB: 08-Jun-1971  MR#: [id number]  UYZ#:[id number]  Patient Care Team: Unk Pinto, MD as PCP - General (Internal Medicine) Jovita Kussmaul, MD as Consulting Physician (General Surgery) Taitum Menton, Virgie Dad, MD as Consulting Physician (Oncology) Kyung Rudd, MD as Consulting Physician (Radiation Oncology) Lorelle Gibbs, MD (Radiology) Howard-McNatt, Mable Fill, MD as Referring Physician (Surgery) OTHER MD:  CHIEF COMPLAINT: Triple positive breast cancer  CURRENT TREATMENT: Adjuvant chemotherapy  INTERVAL HISTORY: Hannah Woodard returns today for follow-up and treatment of her triple positive breast cancer. She receives goserelin injections every 28 days with a dose due today. She tolerates this well.  This is her second dose and she had a period, likely her last one, on 10/05/2017.  It only lasted 2 days.  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 Benign fibrocytic changes with focal columnar call change. Right axillary sentinel lymph nose negative for carcinoma.  Dr Alvan Dame placed a port during the surgical procedure. Dr Morley Kos did bilateral oncoplastic breast reductions.  Hannah Woodard is here today to discuss her upcoming adjuvant chemotherapy.  REVIEW OF SYSTEMS: Hannah Woodard reports that her anesthesia from the lumpectomy made her vomit. She notes that the hospital soap caused her to have a itchiness and redness on her neck and under the right breast. She notes a tingly sensation in her right armpit. She notes that following her breast reduction, her left and right breasts are swollen and she is following up with Dr.  Valente David.  Se notes that she has more ROM in the left arm, but she is exercising the right arm to increase ROM. She notes that she was able to drive today, but as far as exercise she is able to walk half a mile with her dog. She notes that Dr. Lisbeth Renshaw is going to be her radiologist. She denies unusual headaches, visual changes, nausea, vomiting, or dizziness. There has been no unusual cough, phlegm production, or pleurisy. This been no change in bowel or bladder habits. She denies unexplained fatigue or unexplained weight loss, bleeding, rash, or fever. A detailed review of systems was otherwise stable.    HISTORY OF CURRENT ILLNESS: From the original intake note:  The patient has a history of cysts and nodules in the right breast which have been closely followed, with exams 07/30/2016 and 01/28/2017.  On 07/29/2017 follow-up right breast ultrasonography showed no findings of concern.  Bilateral diagnostic mammography 08/18/2017 with right breast ultrasonography on 08/18/2017 at [facility] found the breast density to be category C.  There was now a new irregular high density mass in the posterior portion of the right breast seen on the mediolateral oblique view only.  Ultrasonography confirmed a 1.4 cm lobulated mass in the upper inner quadrant of the right breast 10 cm from the nipple associated with pectoral muscle invasion.  The right axilla was sonographically benign.  Biopsy of this mass 08/24/2017 showed ([id number]) invasive ductal carcinoma, grade 2, estrogen receptor 95% positive, progesterone receptor 100% positive, both with strong staining intensity, with an MIB-1 of 60%, and HER-2 amplified, with a signals ratio of 2.84 (full report pending).  The patient's subsequent history is as detailed below.  PAST MEDICAL HISTORY: Past Medical  History:  Diagnosis Date  . Breast cancer ([place])   . Family history of ovarian cancer 09/02/2017  . Family history of prostate cancer in father 09/02/2017  .  Family history of uterine cancer 09/02/2017  . Plantar fasciitis     PAST SURGICAL HISTORY: No past surgical history on file.  FAMILY HISTORY Family History  Problem Relation Age of Onset  . Ovarian cancer Mother 20  . Prostate cancer Father 71       'high gleason' unsure number  . Ulcerative colitis Sister   . Other Sister        abn ovaian growth, partial hysterectomy  . Uterine cancer Maternal Aunt 57  . Stroke Maternal Aunt 66  . Basal cell carcinoma Brother   . Skin cancer Paternal Uncle   . Skin cancer Maternal Grandmother   . Stroke Paternal Grandfather 58  . Hydrocephalus Cousin   The patient's father died from alcoholic cirrhosis at age 6.  He had been diagnosed with prostate cancer at age 56.  The patient's mother was diagnosed with ovarian cancer at age 45 and died a year later.  The patient has 1 brother, 1 sister.  The patient's brother has had basal cell and other skin cancers.  A paternal aunt had uterine cancer.  Other relatives have had skin cancers but the patient does not know if these were melanomas or not  GYNECOLOGIC HISTORY:  No LMP recorded.  Menarche age 47, the patient has never been pregnant.  She is still having regular periods.  She used oral contraceptives briefly in the past without complications.  She and her husband are very interested in having children, and currently (December 2018) they are not using any contraception.   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  Her husband Rodman Key (goes by [employer]") is a Dealer.  At home it's just them, a [employer] and 2 cats.     ADVANCED DIRECTIVES: Not in place   HEALTH MAINTENANCE: Social History   Tobacco Use  . Smoking status: Never Smoker  . Smokeless tobacco: Never Used  Substance Use Topics  . Alcohol use: Yes  . Drug use: No     Colonoscopy: Never  PAP:  Bone density: Never   Allergies  Allergen Reactions    . Milk-Related Compounds Diarrhea    Stomach cramping  . Poison Ivy Extract [Poison Ivy Extract] Hives, Itching and Rash    Current Outpatient Medications  Medication Sig Dispense Refill  . b complex vitamins tablet Take 1 tablet by mouth daily.    . Cholecalciferol (VITAMIN D) 2000 UNITS tablet Take 10,000 Units by mouth daily.     . Cholecalciferol (VITAMIN D) 2000 units tablet Take by mouth.    . ECHINACEA PO Take by mouth.    . Ferrous Sulfate (IRON) 325 (65 Fe) MG TABS Take by mouth daily.    . fish oil-omega-3 fatty acids 1000 MG capsule Take 1 g by mouth daily.    Nyoka Cowden Tea 250 MG CAPS Take by mouth 2 (two) times daily.    . Iodine Strong, Lugols, (STRONG IODINE) 5 % solution Take 0.2 mLs by mouth 3 (three) times daily. 480 mL 0  . ondansetron (ZOFRAN ODT) 8 MG disintegrating tablet 25m ODT q6 hours prn nausea 30 tablet 0  . TURMERIC PO Take by mouth.    .Marland KitchenUNABLE TO FIND TKuwaitTail mushroom 500 mg    . UNABLE  TO FIND [place] Mushroom 500 mg daily    . vitamin C (ASCORBIC ACID) 500 MG tablet Take 500 mg by mouth 2 (two) times daily.    . Zinc Acetate 50 MG CAPS Take by mouth.    . lidocaine-prilocaine (EMLA) cream Apply to affected area once (Patient not taking: Reported on 10/27/2017) 30 g 3  . ondansetron (ZOFRAN) 8 MG tablet Take 1 tablet (8 mg total) by mouth 2 (two) times daily as needed (Nausea or vomiting). (Patient not taking: Reported on 10/27/2017) 30 tablet 1  . prochlorperazine (COMPAZINE) 10 MG tablet Take 1 tablet (10 mg total) by mouth every 6 (six) hours as needed (Nausea or vomiting). (Patient not taking: Reported on 10/27/2017) 30 tablet 1   No current facility-administered medications for this visit.    Facility-Administered Medications Ordered in Other Visits  Medication Dose Route Frequency Provider Last Rate Last Dose  . goserelin (ZOLADEX) injection 3.6 mg  3.6 mg Subcutaneous Once Jurni Cesaro, Virgie Dad, MD        OBJECTIVE: Young white woman who appears  well  Vitals:   10/27/17 1530  BP: (!) 126/58  Pulse: 99  Resp: 18  Temp: 98.6 F (37 C)  SpO2: 98%     Body mass index is 24.81 kg/m.   Wt Readings from Last 3 Encounters:  10/27/17 153 lb 11.2 oz (69.7 kg)  09/17/17 164 lb 4.8 oz (74.5 kg)  09/01/17 166 lb 1.6 oz (75.3 kg)      ECOG FS:1 - Symptomatic but completely ambulatory  Sclerae unicteric, EOMs intact Oropharynx clear and moist No cervical or supraclavicular adenopathy Lungs no rales or rhonchi Heart regular rate and rhythm Abd soft, nontender, positive bowel sounds MSK no focal spinal tenderness, no upper extremity lymphedema Neuro: nonfocal, well oriented, appropriate affect Breasts: The right breast is status post lumpectomy and both breasts are status post reduction.  There is good symmetry.  There is some erythema and a very shallow erosion in the anterior inferior left wound and in the lateral right breast wound.  This appears to be healing well.  Both axillae are benign  LAB RESULTS:  CMP     Component Value Date/Time   NA 141 10/27/2017 1507   NA 138 09/01/2017 1238   K 4.5 10/27/2017 1507   K 4.4 09/01/2017 1238   CL 101 10/27/2017 1507   CO2 29 10/27/2017 1507   CO2 25 09/01/2017 1238   GLUCOSE 98 10/27/2017 1507   GLUCOSE 83 09/01/2017 1238   BUN 14 10/27/2017 1507   BUN 9.2 09/01/2017 1238   CREATININE 0.7 09/01/2017 1238   CALCIUM 9.8 10/27/2017 1507   CALCIUM 9.4 09/01/2017 1238   PROT 7.3 10/27/2017 1507   PROT 7.4 09/01/2017 1238   ALBUMIN 4.1 10/27/2017 1507   ALBUMIN 4.2 09/01/2017 1238   AST 20 10/27/2017 1507   AST 21 09/01/2017 1238   ALT 21 10/27/2017 1507   ALT 21 09/01/2017 1238   ALKPHOS 62 10/27/2017 1507   ALKPHOS 60 09/01/2017 1238   BILITOT 0.5 10/27/2017 1507   BILITOT 0.65 09/01/2017 1238   GFRNONAA >60 10/27/2017 1507   GFRNONAA 111 05/18/2017 0928   GFRAA >60 10/27/2017 1507   GFRAA 128 05/18/2017 0928    No results found for: TOTALPROTELP, ALBUMINELP, A1GS,  A2GS, BETS, BETA2SER, GAMS, MSPIKE, SPEI  No results found for: KPAFRELGTCHN, LAMBDASER, KAPLAMBRATIO  Lab Results  Component Value Date   WBC 6.8 10/27/2017   NEUTROABS 4.7 10/27/2017  HGB 13.7 09/01/2017   HCT 37.6 10/27/2017   MCV 97.9 10/27/2017   PLT 360 10/27/2017      Chemistry      Component Value Date/Time   NA 141 10/27/2017 1507   NA 138 09/01/2017 1238   K 4.5 10/27/2017 1507   K 4.4 09/01/2017 1238   CL 101 10/27/2017 1507   CO2 29 10/27/2017 1507   CO2 25 09/01/2017 1238   BUN 14 10/27/2017 1507   BUN 9.2 09/01/2017 1238   CREATININE 0.7 09/01/2017 1238      Component Value Date/Time   CALCIUM 9.8 10/27/2017 1507   CALCIUM 9.4 09/01/2017 1238   ALKPHOS 62 10/27/2017 1507   ALKPHOS 60 09/01/2017 1238   AST 20 10/27/2017 1507   AST 21 09/01/2017 1238   ALT 21 10/27/2017 1507   ALT 21 09/01/2017 1238   BILITOT 0.5 10/27/2017 1507   BILITOT 0.65 09/01/2017 1238       No results found for: LABCA2  No components found for: [id number]  No results for input(s): INR in the last 168 hours.  No results found for: LABCA2  No results found for: DDU202  No results found for: RKY706  No results found for: CBJ628  No results found for: CA2729  No components found for: HGQUANT  No results found for: CEA1 / No results found for: CEA1   No results found for: AFPTUMOR  No results found for: CHROMOGRNA  No results found for: PSA1  Appointment on 10/27/2017  Component Date Value Ref Range Status  . Sodium 10/27/2017 141  136 - 145 mmol/L Final  . Potassium 10/27/2017 4.5  3.5 - 5.1 mmol/L Final  . Chloride 10/27/2017 101  98 - 109 mmol/L Final  . CO2 10/27/2017 29  22 - 29 mmol/L Final  . Glucose, Bld 10/27/2017 98  70 - 140 mg/dL Final  . BUN 10/27/2017 14  7 - 26 mg/dL Final  . Creatinine 10/27/2017 0.73  0.60 - 1.10 mg/dL Final  . Calcium 10/27/2017 9.8  8.4 - 10.4 mg/dL Final  . Total Protein 10/27/2017 7.3  6.4 - 8.3 g/dL Final  . Albumin  10/27/2017 4.1  3.5 - 5.0 g/dL Final  . AST 10/27/2017 20  5 - 34 U/L Final  . ALT 10/27/2017 21  0 - 55 U/L Final  . Alkaline Phosphatase 10/27/2017 62  40 - 150 U/L Final  . Total Bilirubin 10/27/2017 0.5  0.2 - 1.2 mg/dL Final  . GFR, Est Non Af Am 10/27/2017 >60  >60 mL/min Final  . GFR, Est AFR Am 10/27/2017 >60  >60 mL/min Final   Comment: (NOTE) The eGFR has been calculated using the CKD EPI equation. This calculation has not been validated in all clinical situations. eGFR's persistently <60 mL/min signify possible Chronic Kidney Disease.   Georgiann Hahn gap 10/27/2017 11  3 - 11 Final   Performed at [facility], [place] [address] [place],  [address]  . WBC Count 10/27/2017 6.8  3.9 - 10.3 K/uL Final  . RBC 10/27/2017 3.85  3.70 - 5.45 MIL/uL Final  . Hemoglobin 10/27/2017 12.8  11.6 - 15.9 g/dL Final  . HCT 10/27/2017 37.6  34.8 - 46.6 % Final  . MCV 10/27/2017 97.9  79.5 - 101.0 fL Final  . MCH 10/27/2017 33.4  25.1 - 34.0 pg Final  . MCHC 10/27/2017 34.1  31.5 - 36.0 g/dL Final  . RDW 10/27/2017 13.6  11.2 - 14.5 % Final  .  Platelet Count 10/27/2017 360  145 - 400 K/uL Final  . Neutrophils Relative % 10/27/2017 68  % Final  . Neutro Abs 10/27/2017 4.7  1.5 - 6.5 K/uL Final  . Lymphocytes Relative 10/27/2017 21  % Final  . Lymphs Abs 10/27/2017 1.4  0.9 - 3.3 K/uL Final  . Monocytes Relative 10/27/2017 7  % Final  . Monocytes Absolute 10/27/2017 0.5  0.1 - 0.9 K/uL Final  . Eosinophils Relative 10/27/2017 3  % Final  . Eosinophils Absolute 10/27/2017 0.2  0.0 - 0.5 K/uL Final  . Basophils Relative 10/27/2017 1  % Final  . Basophils Absolute 10/27/2017 0.0  0.0 - 0.1 K/uL Final   Performed at [facility], [address] [place], [place] [address]    (this displays the last labs from the last 3 days)  No results found for: TOTALPROTELP, ALBUMINELP, A1GS, A2GS, BETS, BETA2SER, GAMS, MSPIKE, SPEI (this displays SPEP  labs)  No results found for: KPAFRELGTCHN, LAMBDASER, KAPLAMBRATIO (kappa/lambda light chains)  No results found for: HGBA, HGBA2QUANT, HGBFQUANT, HGBSQUAN (Hemoglobinopathy evaluation)   No results found for: LDH  Lab Results  Component Value Date   IRON 128 12/16/2016   TIBC 355 12/16/2016   IRONPCTSAT 36 12/16/2016   (Iron and TIBC)  Lab Results  Component Value Date   FERRITIN 67 12/16/2016    Urinalysis    Component Value Date/Time   COLORURINE YELLOW 06/16/2017 [place] 06/16/2017 1442   LABSPEC 1.020 06/16/2017 1442   PHURINE 6.5 06/16/2017 1442   GLUCOSEU NEGATIVE 06/16/2017 1442   HGBUR 2+ (A) 06/16/2017 1442   BILIRUBINUR NEGATIVE 05/18/2017 0928   KETONESUR NEGATIVE 06/16/2017 1442   PROTEINUR NEGATIVE 06/16/2017 1442   UROBILINOGEN 0.2 12/06/2014 1529   NITRITE NEGATIVE 06/16/2017 1442   LEUKOCYTESUR NEGATIVE 06/16/2017 1442    PATHOLOGY SCare Everywhere Result Report Surgical Pathology Tissue Exam1/17/2019 [facility] Result Narrative    ACCESSION NUMBER:[id number] RECEIVED: 10/07/2017 ORDERING PHYSICIAN:MARISSA HOWARD-MCNATT , MD PATIENT NAME:Ozga, Will SURGICAL PATHOLOGY REPORT  FINAL PATHOLOGIC DIAGNOSIS MICROSCOPIC EXAMINATION AND DIAGNOSIS  A.RIGHT AXILLA SENTINEL LYMPH NODE #1, EXCISION: Negative for metastatic carcinoma (0/1).  B.RIGHT BREAST, LUMPECTOMY:  Invasive mammary carcinoma of no special type (ductal), grade 3 of 3. Ductal carcinoma in situ, high nuclear grade. Margins negative. Benign fibrocystic changes with focal columnar cell change.  C.SUPERIOR MARGIN RIGHT BREAST: Benign breast tissue.  D.INFERIOR MARGIN RIGHT BREAST:  Benign breast tissue.  E.LATERAL MARGIN RIGHT BREAST: Benign soft tissue.  F.ANTERIOR MARGIN RIGHT BREAST: Benign breast tissue.  G.RIGHT BREAST TISSUE: Benign skin and breast  tissue.  H.LEFT BREAST TISSUE: Benign skin and breast tissue.  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 ARCHITECTURAL PATTERNS: [place] NUCLEAR GRADE:High NECROSIS:Present , central (comedo) MARGINS:  INVASIVE CARCINOMA MARGINS:Uninvolved by Invasive Carcinoma DISTANCE FROM CLOSEST MARGIN: At least 1.0 mm MARGIN:Deep  DCIS MARGINS:Uninvolved by DCIS DISTANCE FROM CLOSEST MARGIN: At least 1.0 mm MARGIN: Deep REGIONAL LYMPH NODES:  NUMBER OF LYMPH NODES WITH MACROMETASTASES (>2 MM): 0 MICROMETASTASES (>0.2 MM TO  2 MM): 0 ISOLATED TUMOR CELLS (<0.2 MM):0 NUMBER OF LYMPH NODES EXAMINED:1 NUMBER OF SENTINEL NODES EXAMINED:1 TREATMENT EFFECT:No Known Presurgical Therapy LYMPHOVASCULAR INVASION: Not identified PATHOLOGIC STAGE CLASSIFICATION (pTNM, AJCC 8th Ed): pT2 pN0 (sn)  BREAST BIOMARKER RESULTS: Per outside report ESTROGEN RECEPTOR: Positive (95%, strong) PROGESTERONE RECEPTOR: Positive (100%, strong) HER2: No results provided   I have personally reviewed the slides and/or other related materials referenced, and have edited the report as part of my pathologic assessment and final  interpretation.  Electronically Signed Out By: Michele Mcalpine , MD 10/14/2017 12:58:26  keh/keh  Specimen(s) Received A: Right axilla sentinel lymph node #1 B: Right breast lumpectomy; short stitch superior long stitch lateral C: Superior margin right breast; suture marks side of new margin D: Inferior margin right breast; suture marks new site of margin E: Lateral margin right breast; suture marks side of new margin F: Anterior margin right breast; suture marks side of new margin G: Right breast tissue H: Left breast tissue  Clinical History Infiltrating ductal carcinoma of right breast     Gross Description A.Received labeled "right axilla Sentinel lymph node from 1" is a tan and yellow tissue fragment measuring 1.8 x 1.1 x 0.6 cm, serially sectioned to reveal the tan, smooth, uniform cut surfaces of a lymph node candidate and surrounding adipose tissue. The specimen is entirely submitted in A1.  B.Received labeled "right breast lobectomy; short stitch superior long stitch lateral" is a 73 g right lumpectomy oriented by the surgeon with a short suture on the superior aspect and a long suture on the lateral aspect. The specimen measures 4.9 cm (superior-inferior) x 3.1 cm (superficial-deep) x 8.2 cm (lateral-medial). A faxitron is taken and correlates clinically with the specimen. The anterior margin is inked yellow, the posterior margin is inked black, the medial margin is inked red, the lateral margin is inked orange, superior margin is inked blue, and the inferior margin is inked green. The specimen is sectioned from medial to lateral to reveal a firm, white-tan, ill-defined, infiltrating lesion measuring 1.4 x 1.2 x 0.8 cm at the site of the previous biopsy clip, measuring 0.1 cm from the deep margin, 0.2 cm from the superficial margin, 1 cm from the medial margin 4.4 cm from the lateral margin, 1.7 cm from the superior margin, 0.5 cm  from the inferior margin There is also a white-tan fibrous area with embedded cysts measuring 1.6 x 0.4 x 0.7 cm medial to biopsy site, and a white-tan fibrous area with embedded cysts measuring 3.6 x 3.5 x 1.3 cm lateral of the biopsy site. A sample of the lesion from the biopsy site submitted for tumor bank. Representative sections, medial to lateral, are submitted as follows:  BLOCK SUMMARY: B1-68mdial margin, perpendicular sections B4-563frous area with cysts, medial to biopsy site, representative sections [id number] thickness composite section of lesion at biopsy site (joined at a red ink) [id number] composite section of lesion at biopsy site [id number] composite section of lesion at biopsy site (contained radiopaque clip) [id number] composite section of lesion of biopsy site B18-19fibr75farea with cysts, lateral to biopsy site, representative sections B 20-22lateral margin, perpendicular sections  C.Received labeled "superior margin right breast; suture marks side of new margin" is a yellow-tan segment of fibroadipose tissue measuring 3.8 x 2.2 x 1.1 cm oriented by suture to mark the true superior margin. The margin is inked black and the specimen is serially sectioned and submitted in C1-2.  D.Received labeled "inferior margin right breast;  suture marks side of new margin" is a yellow-tan segment of fibroadipose tissue measuring 3.4 x 1.3 x 1. 7 cm oriented by suture to mark the true inferior margin. The margin is inked black and the specimen is serially sectioned and submitted in D1-3.  E.Received labeled "lateral margin right breast; suture marks side of new margin" is a yellow-tan segment of fibroadipose tissue measuring 2.0 x 1.2 x 0.7 cm oriented by suture to mark the true  lateral margin. The margin is inked black and the specimen is serially sectioned and submitted in E1-2.  F.Received labeled "anterior margin right breast; suture marks side of new margin" is a yellow-tan segment of fibroadipose tissue measuring 3.2 x 2.1 x 0.9 cm oriented by suture to mark the true anterior margin. The margin is inked black and the specimen is serially sectioned and submitted in F1-2.  G.Received labeled "right breast tissue" is a 429 g mass of unoriented fibroadipose fragments with overlying white-pink dermal surface measuring 17.5 x 12.4 x 4.7 cm in aggregate. The largest section of overlying dermal tissue measures 8.2 x 4.3 cm. Representative sections are submitted G1-2.  H.Received labeled "left breast tissue" is a 508 g mass of unoriented fibroadipose fragments with overlying white-pink dermal surface measuring 18.7 x 16.1 x 6.5 cm in aggregate. The largest section of overlying dermal tissue measures 11.8 x 5.3 cm. Representative sections are submitted in H1-2,    HARRISON D. MARTIN,   TUDIES:  ELIGIBLE FOR AVAILABLE RESEARCH PROTOCOL: No  ASSESSMENT: 47 y.o. [place] woman status post right breast upper inner quadrant biopsy 08/24/2017 for a clinical T1c N0 invasive ductal carcinoma, triple positive, with an MIB-1 of 60%  (1) genetics testing 09/10/2017 through the Common Hereditary Cancer Panel + Melanoma Panel found no deleterious mutations in: APC, ATM, AXIN2, BAP1, BARD1, BMPR1A, BRCA1, BRCA2, BRIP1, CDH1, CDK4, CDKN2A (p14ARF), CDKN2A (p16INK4a), CHEK2, CTNNA1, DICER1, EPCAM*, GREM1*, KIT, MEN1, MLH1, MSH2, MSH3, MSH6, MUTYH, NBN, NF1, PALB2, PDGFRA, PMS2, POLD1, POLE, POT1, PTEN, RAD50, RAD51C, RAD51D, RB1, SDHB, SDHC, SDHD, SMAD4, SMARCA4, STK11, TP53, TSC1, TSC2, VHL. The following genes were evaluated for sequence changes only: HOXB13*, MITF*, NTHL1*, SDHA  (2) status post right lumpectomy and sentinel axillary lymph  node sampling 10/07/2017 at [facility] for a pT1C pN0, stage IA invasive ductal carcinoma, grade 3, with negative margins  (a) 1 sentinel lymph node removed, bilateral oncoplastic breast reduction  (3) adjuvant chemotherapy to consist of paclitaxel weekly x12 and trastuzumab every 21 days  (4) continue trastuzumab to complete a year  (a) baseline echocardiogram 09/10/2017 shows an ejection fraction of 55-60%  (5) adjuvant radiation to follow my therapy  (6) adjuvant antiestrogens to follow at the completion of local treatment  (7) goserelin for fertility preservation started 09/29/2017  PLAN: Hannah Woodard did well with her surgery and is pleased with the cosmetic result at least so far.  She is still not released to full exercise but she is walking and already met with our chemotherapy teaching nurse so she is preparing herself for treatment, which will began 11/05/2017.  She was able to tell me how to take her nausea medicines and how to use the EMLA specifically I have written her both ondansetron and prochlorperazine.  I have asked her to take 1 on the night of chemo and the other one the following morning.  That way she will be able to tell which one she prefers and whether one causes her any unpleasant side effects.  She is very concerned and  appropriately so regarding the possibility of peripheral neuropathy.  There is data regarding keeping the hands and feet in ice water beginning 15 minutes before treatment and that may work in terms of reducing peripheral neuropathy pretty much the way an ice cap works to save the hair.  Of course saving the peripheral nerves is more important than saving the hair.  We are going to try to operational lysed this with her and I will ask our chemotherapy nurses to help out with this.  Today we did reviewed the possible side effects toxicities and complications of both paclitaxel and trastuzumab.  She is "ready to go", and will receive a dose of goserelin  today.  She is having some hot flashes but has not entered full fledged menopause yet.  That likely will happen in the next few weeks.  She is otherwise tolerating the goserelin well.  She will see me the day of her first treatment to go over these things again.  She knows to call for any other problems that may develop before then.     Edwyna Dangerfield, Virgie Dad, MD  10/27/17 4:33 PM Medical Oncology and Hematology [facility] [address] [place], [place] [address] Tel. [phone]    Fax. [phone]  This document serves as a record of services personally performed by Lurline Del, MD. It was created on his behalf by Sheron Nightingale, a trained medical scribe. The creation of this record is based on the scribe's personal observations and the provider's statements to them.   I have reviewed the above documentation for accuracy and completeness, and I agree with the above.

## 2017-10-26 ENCOUNTER — Other Ambulatory Visit: Payer: Self-pay | Admitting: *Deleted

## 2017-10-26 DIAGNOSIS — Z170 Estrogen receptor positive status [ER+]: Principal | ICD-10-CM

## 2017-10-26 DIAGNOSIS — C50211 Malignant neoplasm of upper-inner quadrant of right female breast: Secondary | ICD-10-CM

## 2017-10-27 ENCOUNTER — Inpatient Hospital Stay: Payer: BC Managed Care – PPO | Attending: Oncology

## 2017-10-27 ENCOUNTER — Inpatient Hospital Stay: Payer: BC Managed Care – PPO

## 2017-10-27 ENCOUNTER — Inpatient Hospital Stay (HOSPITAL_BASED_OUTPATIENT_CLINIC_OR_DEPARTMENT_OTHER): Payer: BC Managed Care – PPO | Admitting: Oncology

## 2017-10-27 VITALS — BP 126/58 | HR 99 | Temp 98.6°F | Resp 18 | Ht 66.0 in | Wt 153.7 lb

## 2017-10-27 DIAGNOSIS — Z79818 Long term (current) use of other agents affecting estrogen receptors and estrogen levels: Secondary | ICD-10-CM

## 2017-10-27 DIAGNOSIS — Z79899 Other long term (current) drug therapy: Secondary | ICD-10-CM | POA: Diagnosis not present

## 2017-10-27 DIAGNOSIS — Z170 Estrogen receptor positive status [ER+]: Secondary | ICD-10-CM

## 2017-10-27 DIAGNOSIS — Z8042 Family history of malignant neoplasm of prostate: Secondary | ICD-10-CM | POA: Insufficient documentation

## 2017-10-27 DIAGNOSIS — Z808 Family history of malignant neoplasm of other organs or systems: Secondary | ICD-10-CM | POA: Diagnosis not present

## 2017-10-27 DIAGNOSIS — C50211 Malignant neoplasm of upper-inner quadrant of right female breast: Secondary | ICD-10-CM | POA: Insufficient documentation

## 2017-10-27 DIAGNOSIS — Z8041 Family history of malignant neoplasm of ovary: Secondary | ICD-10-CM | POA: Insufficient documentation

## 2017-10-27 LAB — CBC WITH DIFFERENTIAL (CANCER CENTER ONLY)
BASOS ABS: 0 10*3/uL (ref 0.0–0.1)
Basophils Relative: 1 %
Eosinophils Absolute: 0.2 10*3/uL (ref 0.0–0.5)
Eosinophils Relative: 3 %
HCT: 37.6 % (ref 34.8–46.6)
Hemoglobin: 12.8 g/dL (ref 11.6–15.9)
LYMPHS ABS: 1.4 10*3/uL (ref 0.9–3.3)
LYMPHS PCT: 21 %
MCH: 33.4 pg (ref 25.1–34.0)
MCHC: 34.1 g/dL (ref 31.5–36.0)
MCV: 97.9 fL (ref 79.5–101.0)
MONO ABS: 0.5 10*3/uL (ref 0.1–0.9)
Monocytes Relative: 7 %
Neutro Abs: 4.7 10*3/uL (ref 1.5–6.5)
Neutrophils Relative %: 68 %
Platelet Count: 360 10*3/uL (ref 145–400)
RBC: 3.85 MIL/uL (ref 3.70–5.45)
RDW: 13.6 % (ref 11.2–14.5)
WBC Count: 6.8 10*3/uL (ref 3.9–10.3)

## 2017-10-27 LAB — CMP (CANCER CENTER ONLY)
ALBUMIN: 4.1 g/dL (ref 3.5–5.0)
ALT: 21 U/L (ref 0–55)
AST: 20 U/L (ref 5–34)
Alkaline Phosphatase: 62 U/L (ref 40–150)
Anion gap: 11 (ref 3–11)
BILIRUBIN TOTAL: 0.5 mg/dL (ref 0.2–1.2)
BUN: 14 mg/dL (ref 7–26)
CALCIUM: 9.8 mg/dL (ref 8.4–10.4)
CO2: 29 mmol/L (ref 22–29)
Chloride: 101 mmol/L (ref 98–109)
Creatinine: 0.73 mg/dL (ref 0.60–1.10)
GFR, Est AFR Am: >60 mL/min (ref >60)
GFR, Estimated: >60 mL/min (ref >60)
GLUCOSE: 98 mg/dL (ref 70–140)
Potassium: 4.5 mmol/L (ref 3.5–5.1)
SODIUM: 141 mmol/L (ref 136–145)
TOTAL PROTEIN: 7.3 g/dL (ref 6.4–8.3)

## 2017-10-27 MED ORDER — PROCHLORPERAZINE MALEATE 10 MG PO TABS: 10.0000 mg | ORAL_TABLET | Freq: Four times a day (QID) | ORAL | 1 refills | Status: DC | PRN

## 2017-10-27 MED ORDER — ONDANSETRON HCL 8 MG PO TABS: 8.0000 mg | ORAL_TABLET | Freq: Two times a day (BID) | ORAL | 1 refills | Status: DC | PRN

## 2017-10-27 MED ORDER — LIDOCAINE-PRILOCAINE 2.5-2.5 % EX CREA: TOPICAL_CREAM | CUTANEOUS | 3 refills | Status: DC

## 2017-10-27 MED ORDER — GOSERELIN ACETATE 3.6 MG ~~LOC~~ IMPL
3.6000 mg | DRUG_IMPLANT | Freq: Once | SUBCUTANEOUS | Status: AC
  Administered 2017-10-27: 3.6 mg via SUBCUTANEOUS

## 2017-10-28 ENCOUNTER — Encounter: Payer: Self-pay | Admitting: *Deleted

## 2017-10-28 ENCOUNTER — Telehealth: Payer: Self-pay | Admitting: *Deleted

## 2017-10-28 NOTE — Telephone Encounter (Signed)
This RN returned VM from pt per her call stating per visit to dentist today " I have a cavity that is pretty significant that the dentist feels best to extract the tooth "   " they said they will work me in to have extraction done since I am to start chemo so I need to know what to do " " they said ideally it takes about 10 days for appropriate tissue healing so I am concerned about starting chemo next week"   Per chart review pt had surgery on 10/07/2017- informed her to proceed with needed extraction asap.  She should keep scheduled appointment on 11/05/2017 and if needed chemo can be delayed with goal to implement chemotherapy within 6 weeks of surgery.  Hannah Woodard verbalized understanding - and will notify this office when she is scheduled for extraction.  This note will be forwarded to MD for review and possible need for change in schedule as well to [place] for communication.

## 2017-10-29 ENCOUNTER — Encounter: Payer: Self-pay | Admitting: Oncology

## 2017-10-29 HISTORY — PX: OTHER SURGICAL HISTORY: SHX169

## 2017-11-01 ENCOUNTER — Other Ambulatory Visit: Payer: Self-pay | Admitting: Oncology

## 2017-11-01 ENCOUNTER — Telehealth: Payer: Self-pay | Admitting: *Deleted

## 2017-11-01 NOTE — Telephone Encounter (Signed)
Pt called request to move chemo to 2/15. Informed Dr. Jana Hakim.  Discussed with pt per Dr. Jana Hakim we will cancel her appt on 2/8 and increase chemo time on 2/15. Received verbal understanding.

## 2017-11-01 NOTE — Progress Notes (Signed)
Hannah Woodard had a wisdom teeth pulled and she would like to postpone her chemo for a week.  We were glad to accommodate that

## 2017-11-02 ENCOUNTER — Ambulatory Visit: Payer: BC Managed Care – PPO

## 2017-11-02 ENCOUNTER — Other Ambulatory Visit: Payer: BC Managed Care – PPO

## 2017-11-04 ENCOUNTER — Telehealth: Payer: Self-pay | Admitting: Oncology

## 2017-11-04 NOTE — Telephone Encounter (Signed)
11/04/17 @ 4:18 pm called and spoke with patient to confirm Disability paperwork was successfully faxed to [employer] @ [phone] @ 3:57 pm.  Patient requested a copy be sent to her for personal records.

## 2017-11-05 ENCOUNTER — Other Ambulatory Visit: Payer: BC Managed Care – PPO

## 2017-11-05 ENCOUNTER — Ambulatory Visit: Payer: BC Managed Care – PPO

## 2017-11-05 ENCOUNTER — Ambulatory Visit: Payer: BC Managed Care – PPO | Admitting: Oncology

## 2017-11-09 ENCOUNTER — Other Ambulatory Visit: Payer: BC Managed Care – PPO

## 2017-11-09 ENCOUNTER — Ambulatory Visit: Payer: BC Managed Care – PPO

## 2017-11-09 NOTE — Progress Notes (Signed)
[place]  [phone] [phone]) [phone]     ID: [name] DOB: 12-02-1970  MR#: [id number]  AYT#:[id number]  Patient Care Team: Unk Pinto, MD as PCP - General (Internal Medicine) Jovita Kussmaul, MD as Consulting Physician (General Surgery) Magrinat, Virgie Dad, MD as Consulting Physician (Oncology) Kyung Rudd, MD as Consulting Physician (Radiation Oncology) Lorelle Gibbs, MD (Radiology) Howard-McNatt, Mable Fill, MD as Referring Physician (Surgery) OTHER MD:  CHIEF COMPLAINT: Triple positive breast cancer  CURRENT TREATMENT: Adjuvant chemotherapy  INTERVAL HISTORY: Hannah Woodard returns today for follow-up and treatment of her triple positive breast cancer accompanied by her husband. She receives goserelin injections every 28 days.  Her last period was in January.  Last week she had very minimal bleeding namely a little teeny bit of blood when she wiped anteriorly.  She is having significant hot flashes.  She is starting trastuzumab and paclitaxel today. We are doing cryoprotection with the taxol treatments and we discussed that today.  REVIEW OF SYSTEMS: Hannah Woodard is still limited in her activity because of her recent breast surgery.  The 2 areas of very minimal dehiscence (and that is really overstating it) have now eschared over.  They measure approximately a third of a millimeter, one on each side.  The area on the right side that was draining slightly has closed.  She has no pain associated with the breast surgery.  She is very anxious because she is using CBD oil and does not know if it might interact with her antinausea medications or chemo.  She understands that she is taking this at her own cognizance since this is marketed as a food although promoted as a medicine and therefore they do not have to do any evaluation regarding for example drug interactions my recommendation to her is that she not take it during her chemo day or the next 2 days.   Aside from this a detailed review of systems today was stable  HISTORY OF CURRENT ILLNESS: From the original intake note:  The patient has a history of cysts and nodules in the right breast which have been closely followed, with exams 07/30/2016 and 01/28/2017.  On 07/29/2017 follow-up right breast ultrasonography showed no findings of concern.  Bilateral diagnostic mammography 08/18/2017 with right breast ultrasonography on 08/18/2017 at [facility] found the breast density to be category C.  There was now a new irregular high density mass in the posterior portion of the right breast seen on the mediolateral oblique view only.  Ultrasonography confirmed a 1.4 cm lobulated mass in the upper inner quadrant of the right breast 10 cm from the nipple associated with pectoral muscle invasion.  The right axilla was sonographically benign.  Biopsy of this mass 08/24/2017 showed ([id number]) invasive ductal carcinoma, grade 2, estrogen receptor 95% positive, progesterone receptor 100% positive, both with strong staining intensity, with an MIB-1 of 60%, and HER-2 amplified, with a signals ratio of 2.84 (full report pending).  The patient's subsequent history is as detailed below.  PAST MEDICAL HISTORY: Past Medical History:  Diagnosis Date  . Breast cancer ([place])   . Family history of ovarian cancer 09/02/2017  . Family history of prostate cancer in father 09/02/2017  . Family history of uterine cancer 09/02/2017  . Plantar fasciitis     PAST SURGICAL HISTORY: No past surgical history on file.  FAMILY HISTORY Family History  Problem Relation Age of Onset  . Ovarian cancer Mother 51  . Prostate cancer Father 53       '  high gleason' unsure number  . Ulcerative colitis Sister   . Other Sister        abn ovaian growth, partial hysterectomy  . Uterine cancer Maternal Aunt 37  . Stroke Maternal Aunt 66  . Basal cell carcinoma Brother   . Skin cancer Paternal Uncle   . Skin cancer Maternal Grandmother    . Stroke Paternal Grandfather 6  . Hydrocephalus Cousin   The patient's father died from alcoholic cirrhosis at age 70.  He had been diagnosed with prostate cancer at age 19.  The patient's mother was diagnosed with ovarian cancer at age 18 and died a year later.  The patient has 1 brother, 1 sister.  The patient's brother has had basal cell and other skin cancers.  A paternal aunt had uterine cancer.  Other relatives have had skin cancers but the patient does not know if these were melanomas or not  GYNECOLOGIC HISTORY:  No LMP recorded.  Menarche age 47, the patient has never been pregnant.  She is still having regular periods.  She used oral contraceptives briefly in the past without complications. She and her husband are very interested in having children, and currently (December 2018) they are not using any contraception.   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  Her husband Hannah Woodard (goes by [employer]") is a Dealer.  At home it's just them, a [employer] and 2 cats.     ADVANCED DIRECTIVES: Not in place   HEALTH MAINTENANCE: Social History   Tobacco Use  . Smoking status: Never Smoker  . Smokeless tobacco: Never Used  Substance Use Topics  . Alcohol use: Yes  . Drug use: No     Colonoscopy: Never  PAP:  Bone density: Never   Allergies  Allergen Reactions  . Milk-Related Compounds Diarrhea    Stomach cramping  . Poison Ivy Extract [Poison Ivy Extract] Hives, Itching and Rash    Current Outpatient Medications  Medication Sig Dispense Refill  . b complex vitamins tablet Take 1 tablet by mouth daily.    . Cholecalciferol (VITAMIN D) 2000 UNITS tablet Take 10,000 Units by mouth daily.     . Cholecalciferol (VITAMIN D) 2000 units tablet Take by mouth.    . ECHINACEA PO Take by mouth.    . Ferrous Sulfate (IRON) 325 (65 Fe) MG TABS Take by mouth daily.    . fish oil-omega-3 fatty acids 1000 MG  capsule Take 1 g by mouth daily.    Nyoka Cowden Tea 250 MG CAPS Take by mouth 2 (two) times daily.    . Iodine Strong, Lugols, (STRONG IODINE) 5 % solution Take 0.2 mLs by mouth 3 (three) times daily. 480 mL 0  . ondansetron (ZOFRAN ODT) 8 MG disintegrating tablet 13m ODT q6 hours prn nausea 30 tablet 0  . TURMERIC PO Take by mouth.    .Marland KitchenUNABLE TO FIND TKuwaitTail mushroom 500 mg    . UNABLE TO FIND [place] Mushroom 500 mg daily    . UNABLE TO FIND CBD Oil; patient takes for nausea    . vitamin C (ASCORBIC ACID) 500 MG tablet Take 500 mg by mouth 2 (two) times daily.    . Zinc Acetate 50 MG CAPS Take by mouth.    . lidocaine-prilocaine (EMLA) cream Apply to affected area once (Patient not taking: Reported on 10/27/2017) 30 g 3  . ondansetron (ZOFRAN) 8 MG tablet Take 1 tablet (8  mg total) by mouth 2 (two) times daily as needed (Nausea or vomiting). (Patient not taking: Reported on 10/27/2017) 30 tablet 1  . prochlorperazine (COMPAZINE) 10 MG tablet Take 1 tablet (10 mg total) by mouth every 6 (six) hours as needed (Nausea or vomiting). (Patient not taking: Reported on 10/27/2017) 30 tablet 1   No current facility-administered medications for this visit.     OBJECTIVE: Young white woman in no acute distress  Vitals:   11/12/17 0905  BP: 125/70  Pulse: 69  Resp: 18  Temp: 98.1 F (36.7 C)  SpO2: 100%     Body mass index is 24.5 kg/m.   Wt Readings from Last 3 Encounters:  11/12/17 151 lb 12.8 oz (68.9 kg)  10/27/17 153 lb 11.2 oz (69.7 kg)  09/17/17 164 lb 4.8 oz (74.5 kg)      ECOG FS:1 - Symptomatic but completely ambulatory  Sclerae unicteric, pupils round and equal Oropharynx clear and moist No cervical or supraclavicular adenopathy Lungs no rales or rhonchi Heart regular rate and rhythm Abd soft, nontender, positive bowel sounds MSK no focal spinal tenderness, no upper extremity lymphedema Neuro: nonfocal, well oriented, appropriate affect Breasts: She is status post  bilateral reduction mammoplasty and right sided lumpectomy.  The cosmetic result is excellent.  The incisions are healing very nicely as described above.  Both axillae are benign.  LAB RESULTS:  CMP     Component Value Date/Time   NA 141 10/27/2017 1507   NA 138 09/01/2017 1238   K 4.5 10/27/2017 1507   K 4.4 09/01/2017 1238   CL 101 10/27/2017 1507   CO2 29 10/27/2017 1507   CO2 25 09/01/2017 1238   GLUCOSE 98 10/27/2017 1507   GLUCOSE 83 09/01/2017 1238   BUN 14 10/27/2017 1507   BUN 9.2 09/01/2017 1238   CREATININE 0.73 10/27/2017 1507   CREATININE 0.7 09/01/2017 1238   CALCIUM 9.8 10/27/2017 1507   CALCIUM 9.4 09/01/2017 1238   PROT 7.3 10/27/2017 1507   PROT 7.4 09/01/2017 1238   ALBUMIN 4.1 10/27/2017 1507   ALBUMIN 4.2 09/01/2017 1238   AST 20 10/27/2017 1507   AST 21 09/01/2017 1238   ALT 21 10/27/2017 1507   ALT 21 09/01/2017 1238   ALKPHOS 62 10/27/2017 1507   ALKPHOS 60 09/01/2017 1238   BILITOT 0.5 10/27/2017 1507   BILITOT 0.65 09/01/2017 1238   GFRNONAA >60 10/27/2017 1507   GFRNONAA 111 05/18/2017 0928   GFRAA >60 10/27/2017 1507   GFRAA 128 05/18/2017 0928    No results found for: TOTALPROTELP, ALBUMINELP, A1GS, A2GS, BETS, BETA2SER, GAMS, MSPIKE, SPEI  No results found for: Nils Pyle, [facility]  Lab Results  Component Value Date   WBC 4.5 11/12/2017   NEUTROABS 2.7 11/12/2017   HGB 13.7 09/01/2017   HCT 37.6 11/12/2017   MCV 99.5 11/12/2017   PLT 210 11/12/2017      Chemistry      Component Value Date/Time   NA 141 10/27/2017 1507   NA 138 09/01/2017 1238   K 4.5 10/27/2017 1507   K 4.4 09/01/2017 1238   CL 101 10/27/2017 1507   CO2 29 10/27/2017 1507   CO2 25 09/01/2017 1238   BUN 14 10/27/2017 1507   BUN 9.2 09/01/2017 1238   CREATININE 0.73 10/27/2017 1507   CREATININE 0.7 09/01/2017 1238      Component Value Date/Time   CALCIUM 9.8 10/27/2017 1507   CALCIUM 9.4 09/01/2017 1238   ALKPHOS 62 10/27/2017 1507  ALKPHOS 60 09/01/2017 1238   AST 20 10/27/2017 1507   AST 21 09/01/2017 1238   ALT 21 10/27/2017 1507   ALT 21 09/01/2017 1238   BILITOT 0.5 10/27/2017 1507   BILITOT 0.65 09/01/2017 1238       No results found for: LABCA2  No components found for: [id number]  No results for input(s): INR in the last 168 hours.  No results found for: LABCA2  No results found for: NTI144  No results found for: RXV400  No results found for: QQP619  No results found for: CA2729  No components found for: HGQUANT  No results found for: CEA1 / No results found for: CEA1   No results found for: AFPTUMOR  No results found for: CHROMOGRNA  No results found for: PSA1  Appointment on 11/12/2017  Component Date Value Ref Range Status  . WBC Count 11/12/2017 4.5  3.9 - 10.3 K/uL Final  . RBC 11/12/2017 3.78  3.70 - 5.45 MIL/uL Final  . Hemoglobin 11/12/2017 12.6  11.6 - 15.9 g/dL Final  . HCT 11/12/2017 37.6  34.8 - 46.6 % Final  . MCV 11/12/2017 99.5  79.5 - 101.0 fL Final  . MCH 11/12/2017 33.3  25.1 - 34.0 pg Final  . MCHC 11/12/2017 33.5  31.5 - 36.0 g/dL Final  . RDW 11/12/2017 13.8  11.2 - 14.5 % Final  . Platelet Count 11/12/2017 210  145 - 400 K/uL Final  . Neutrophils Relative % 11/12/2017 60  % Final  . Neutro Abs 11/12/2017 2.7  1.5 - 6.5 K/uL Final  . Lymphocytes Relative 11/12/2017 23  % Final  . Lymphs Abs 11/12/2017 1.1  0.9 - 3.3 K/uL Final  . Monocytes Relative 11/12/2017 8  % Final  . Monocytes Absolute 11/12/2017 0.4  0.1 - 0.9 K/uL Final  . Eosinophils Relative 11/12/2017 9  % Final  . Eosinophils Absolute 11/12/2017 0.4  0.0 - 0.5 K/uL Final  . Basophils Relative 11/12/2017 0  % Final  . Basophils Absolute 11/12/2017 0.0  0.0 - 0.1 K/uL Final   Performed at [facility], [place] [address], [place], [place] [address]    (this displays the last labs from the last 3 days)  No results found for: TOTALPROTELP, ALBUMINELP, A1GS, A2GS, BETS,  BETA2SER, GAMS, MSPIKE, SPEI (this displays SPEP labs)  No results found for: KPAFRELGTCHN, LAMBDASER, KAPLAMBRATIO (kappa/lambda light chains)  No results found for: HGBA, HGBA2QUANT, HGBFQUANT, HGBSQUAN (Hemoglobinopathy evaluation)   No results found for: LDH  Lab Results  Component Value Date   IRON 128 12/16/2016   TIBC 355 12/16/2016   IRONPCTSAT 36 12/16/2016   (Iron and TIBC)  Lab Results  Component Value Date   FERRITIN 67 12/16/2016    Urinalysis    Component Value Date/Time   COLORURINE YELLOW 06/16/2017 [place] 06/16/2017 1442   LABSPEC 1.020 06/16/2017 1442   PHURINE 6.5 06/16/2017 1442   GLUCOSEU NEGATIVE 06/16/2017 1442   HGBUR 2+ (A) 06/16/2017 1442   BILIRUBINUR NEGATIVE 05/18/2017 0928   KETONESUR NEGATIVE 06/16/2017 1442   PROTEINUR NEGATIVE 06/16/2017 1442   UROBILINOGEN 0.2 12/06/2014 1529   NITRITE NEGATIVE 06/16/2017 1442   LEUKOCYTESUR NEGATIVE 06/16/2017 1442    STUDIES: No results found.   ELIGIBLE FOR AVAILABLE RESEARCH PROTOCOL: No  ASSESSMENT: 47 y.o. [place] woman status post right breast upper inner quadrant biopsy 08/24/2017 for a clinical T1c N0 invasive ductal carcinoma, triple positive, with an MIB-1 of 60%  (1) genetics  testing 09/10/2017 through the Common Hereditary Cancer Panel + Melanoma Panel found no deleterious mutations in: APC, ATM, AXIN2, BAP1, BARD1, BMPR1A, BRCA1, BRCA2, BRIP1, CDH1, CDK4, CDKN2A (p14ARF), CDKN2A (p16INK4a), CHEK2, CTNNA1, DICER1, EPCAM*, GREM1*, KIT, MEN1, MLH1, MSH2, MSH3, MSH6, MUTYH, NBN, NF1, PALB2, PDGFRA, PMS2, POLD1, POLE, POT1, PTEN, RAD50, RAD51C, RAD51D, RB1, SDHB, SDHC, SDHD, SMAD4, SMARCA4, STK11, TP53, TSC1, TSC2, VHL. The following genes were evaluated for sequence changes only: HOXB13*, MITF*, NTHL1*, SDHA  (2) status post right lumpectomy and sentinel axillary lymph node sampling 10/07/2017 at [facility] for a pT1C pN0, stage IA invasive ductal carcinoma, grade  3, with negative margins  (a) 1 sentinel lymph node removed, bilateral oncoplastic breast reduction  (3) adjuvant chemotherapy consisting of paclitaxel weekly x12 and trastuzumab every 21 days STARTING 11/12/2017  (4) continue trastuzumab to complete a year  (a) baseline echocardiogram 09/10/2017 shows an ejection fraction of 55-60%  (5) adjuvant radiation to follow my therapy  (6) adjuvant antiestrogens to follow at the completion of local treatment  (7) goserelin for fertility preservation started 09/29/2017  PLAN: Hannah Woodard is ready to start her Taxol and trastuzumab today.  We discussed cryo prevention strategies to allow her to receive her full cycle of Taxol without developing neuropathy.  We discussed the possible side effects toxicities and complications of these agents again and I have asked her to keep a side effect diary over the next several days so we can discuss at next week.  She understands there is a possible first dose reaction to these agents.  Some of that would occur here and we would take care of it       but some of that may be later in some patients with the first dose of Taxol have significant bony aches.  She will let us know if that develops.  She will take prochlorperazine tonight and ondansetron tomorrow morning after which she may use either at her discretion and on an as-needed basis  I have encouraged her to increase her walking program from half a mile a day to a mile and a half if she can.  Her husband is going to be changing the cat litter and vacuuming  I have moved her go Sharl Ma to [employer] the Taxol treatment days.  We will see her again next week after that we will see her every 2 weeks until she gets to her eighth dose of Taxol.  From that point I will see her weekly.    Magrinat, Virgie Dad, MD  11/12/17 9:36 AM Medical Oncology and Hematology [facility] [address] [place], [place] [address] Tel. [phone]    Fax.  [phone]  This document serves as a record of services personally performed by Lurline Del, MD. It was created on his behalf by Sheron Nightingale, a trained medical scribe. The creation of this record is based on the scribe's personal observations and the provider's statements to them.   I have reviewed the above documentation for accuracy and completeness, and I agree with the above.

## 2017-11-11 ENCOUNTER — Other Ambulatory Visit: Payer: Self-pay

## 2017-11-11 DIAGNOSIS — Z170 Estrogen receptor positive status [ER+]: Principal | ICD-10-CM

## 2017-11-11 DIAGNOSIS — C50211 Malignant neoplasm of upper-inner quadrant of right female breast: Secondary | ICD-10-CM

## 2017-11-12 ENCOUNTER — Inpatient Hospital Stay: Payer: BC Managed Care – PPO

## 2017-11-12 ENCOUNTER — Inpatient Hospital Stay (HOSPITAL_BASED_OUTPATIENT_CLINIC_OR_DEPARTMENT_OTHER): Payer: BC Managed Care – PPO | Admitting: Oncology

## 2017-11-12 ENCOUNTER — Ambulatory Visit: Payer: BC Managed Care – PPO | Admitting: Medical

## 2017-11-12 ENCOUNTER — Inpatient Hospital Stay: Payer: BC Managed Care – PPO | Attending: Oncology

## 2017-11-12 ENCOUNTER — Telehealth: Payer: Self-pay | Admitting: Oncology

## 2017-11-12 VITALS — BP 110/74 | HR 80 | Temp 98.1°F | Resp 18

## 2017-11-12 VITALS — BP 125/70 | HR 69 | Temp 98.1°F | Resp 18 | Ht 66.0 in | Wt 151.8 lb

## 2017-11-12 DIAGNOSIS — Z8041 Family history of malignant neoplasm of ovary: Secondary | ICD-10-CM | POA: Diagnosis not present

## 2017-11-12 DIAGNOSIS — Z5112 Encounter for antineoplastic immunotherapy: Secondary | ICD-10-CM | POA: Insufficient documentation

## 2017-11-12 DIAGNOSIS — Z170 Estrogen receptor positive status [ER+]: Secondary | ICD-10-CM | POA: Diagnosis not present

## 2017-11-12 DIAGNOSIS — C50211 Malignant neoplasm of upper-inner quadrant of right female breast: Secondary | ICD-10-CM | POA: Insufficient documentation

## 2017-11-12 DIAGNOSIS — Z79899 Other long term (current) drug therapy: Secondary | ICD-10-CM

## 2017-11-12 DIAGNOSIS — M549 Dorsalgia, unspecified: Secondary | ICD-10-CM | POA: Diagnosis not present

## 2017-11-12 DIAGNOSIS — K5900 Constipation, unspecified: Secondary | ICD-10-CM | POA: Insufficient documentation

## 2017-11-12 DIAGNOSIS — H04129 Dry eye syndrome of unspecified lacrimal gland: Secondary | ICD-10-CM | POA: Insufficient documentation

## 2017-11-12 DIAGNOSIS — Z8042 Family history of malignant neoplasm of prostate: Secondary | ICD-10-CM | POA: Diagnosis not present

## 2017-11-12 DIAGNOSIS — Z5111 Encounter for antineoplastic chemotherapy: Secondary | ICD-10-CM | POA: Diagnosis not present

## 2017-11-12 DIAGNOSIS — F418 Other specified anxiety disorders: Secondary | ICD-10-CM

## 2017-11-12 DIAGNOSIS — R232 Flushing: Secondary | ICD-10-CM | POA: Diagnosis not present

## 2017-11-12 DIAGNOSIS — Z95828 Presence of other vascular implants and grafts: Secondary | ICD-10-CM

## 2017-11-12 DIAGNOSIS — Z808 Family history of malignant neoplasm of other organs or systems: Secondary | ICD-10-CM | POA: Diagnosis not present

## 2017-11-12 LAB — CBC WITH DIFFERENTIAL (CANCER CENTER ONLY)
BASOS PCT: 0 %
Basophils Absolute: 0 10*3/uL (ref 0.0–0.1)
Eosinophils Absolute: 0.4 10*3/uL (ref 0.0–0.5)
Eosinophils Relative: 9 %
HEMATOCRIT: 37.6 % (ref 34.8–46.6)
Hemoglobin: 12.6 g/dL (ref 11.6–15.9)
LYMPHS ABS: 1.1 10*3/uL (ref 0.9–3.3)
LYMPHS PCT: 23 %
MCH: 33.3 pg (ref 25.1–34.0)
MCHC: 33.5 g/dL (ref 31.5–36.0)
MCV: 99.5 fL (ref 79.5–101.0)
MONO ABS: 0.4 10*3/uL (ref 0.1–0.9)
MONOS PCT: 8 %
NEUTROS ABS: 2.7 10*3/uL (ref 1.5–6.5)
Neutrophils Relative %: 60 %
Platelet Count: 210 10*3/uL (ref 145–400)
RBC: 3.78 MIL/uL (ref 3.70–5.45)
RDW: 13.8 % (ref 11.2–14.5)
WBC Count: 4.5 10*3/uL (ref 3.9–10.3)

## 2017-11-12 LAB — CMP (CANCER CENTER ONLY)
ALT: 18 U/L (ref 0–55)
AST: 19 U/L (ref 5–34)
Albumin: 4.1 g/dL (ref 3.5–5.0)
Alkaline Phosphatase: 50 U/L (ref 40–150)
Anion gap: 10 (ref 3–11)
BUN: 12 mg/dL (ref 7–26)
CO2: 26 mmol/L (ref 22–29)
Calcium: 9.6 mg/dL (ref 8.4–10.4)
Chloride: 107 mmol/L (ref 98–109)
Creatinine: 0.67 mg/dL (ref 0.60–1.10)
GFR, Est AFR Am: >60 mL/min
GFR, Estimated: >60 mL/min
Glucose, Bld: 90 mg/dL (ref 70–140)
Potassium: 3.7 mmol/L (ref 3.5–5.1)
Sodium: 143 mmol/L (ref 136–145)
Total Bilirubin: 0.7 mg/dL (ref 0.2–1.2)
Total Protein: 6.7 g/dL (ref 6.4–8.3)

## 2017-11-12 MED ORDER — ACETAMINOPHEN 325 MG PO TABS
ORAL_TABLET | ORAL | Status: AC
Start: 2017-11-12 — End: 2017-11-12
  Filled 2017-11-12: qty 1

## 2017-11-12 MED ORDER — TRASTUZUMAB CHEMO 150 MG IV SOLR
8.0000 mg/kg | Freq: Once | INTRAVENOUS | Status: AC
  Administered 2017-11-12: 588 mg via INTRAVENOUS
  Filled 2017-11-12: qty 28

## 2017-11-12 MED ORDER — PACLITAXEL CHEMO INJECTION 300 MG/50ML
80.0000 mg/m2 | Freq: Once | INTRAVENOUS | Status: AC
  Administered 2017-11-12: 150 mg via INTRAVENOUS
  Filled 2017-11-12: qty 25

## 2017-11-12 MED ORDER — DIPHENHYDRAMINE HCL 50 MG/ML IJ SOLN
INTRAMUSCULAR | Status: AC
Start: 2017-11-12 — End: 2017-11-12
  Filled 2017-11-12: qty 1

## 2017-11-12 MED ORDER — FAMOTIDINE IN NACL 20-0.9 MG/50ML-% IV SOLN: 20.0000 mg | Freq: Once | INTRAVENOUS | Status: DC | PRN

## 2017-11-12 MED ORDER — DIPHENHYDRAMINE HCL 50 MG/ML IJ SOLN
25.0000 mg | Freq: Once | INTRAMUSCULAR | Status: AC | PRN
  Administered 2017-11-12: 25 mg via INTRAVENOUS

## 2017-11-12 MED ORDER — HEPARIN SOD (PORK) LOCK FLUSH 100 UNIT/ML IV SOLN
500.0000 [IU] | Freq: Once | INTRAVENOUS | Status: AC | PRN
  Administered 2017-11-12: 500 [IU]
  Filled 2017-11-12: qty 5

## 2017-11-12 MED ORDER — ACETAMINOPHEN 325 MG PO TABS
650.0000 mg | ORAL_TABLET | Freq: Once | ORAL | Status: AC
  Administered 2017-11-12: 650 mg via ORAL

## 2017-11-12 MED ORDER — SODIUM CHLORIDE 0.9 % IV SOLN
20.0000 mg | Freq: Once | INTRAVENOUS | Status: AC
  Administered 2017-11-12: 20 mg via INTRAVENOUS
  Filled 2017-11-12: qty 2

## 2017-11-12 MED ORDER — SODIUM CHLORIDE 0.9 % IV SOLN
Freq: Once | INTRAVENOUS | Status: AC
  Administered 2017-11-12: 10:00:00 via INTRAVENOUS

## 2017-11-12 MED ORDER — DEXAMETHASONE SODIUM PHOSPHATE 10 MG/ML IJ SOLN
INTRAMUSCULAR | Status: AC
  Filled 2017-11-12: qty 1

## 2017-11-12 MED ORDER — SODIUM CHLORIDE 0.9% FLUSH
10.0000 mL | INTRAVENOUS | Status: DC | PRN
  Administered 2017-11-12: 10 mL
  Filled 2017-11-12: qty 10

## 2017-11-12 MED ORDER — FAMOTIDINE IN NACL 20-0.9 MG/50ML-% IV SOLN: 20.0000 mg | Freq: Once | INTRAVENOUS | Status: DC

## 2017-11-12 MED ORDER — METHYLPREDNISOLONE SODIUM SUCC 125 MG IJ SOLR
125.0000 mg | Freq: Once | INTRAMUSCULAR | Status: AC | PRN
  Administered 2017-11-12: 125 mg via INTRAVENOUS

## 2017-11-12 MED ORDER — ACETAMINOPHEN 325 MG PO TABS
ORAL_TABLET | ORAL | Status: AC
  Filled 2017-11-12: qty 2

## 2017-11-12 MED ORDER — SODIUM CHLORIDE 0.9% FLUSH
10.0000 mL | Freq: Once | INTRAVENOUS | Status: AC
  Administered 2017-11-12: 10 mL
  Filled 2017-11-12: qty 10

## 2017-11-12 MED ORDER — FAMOTIDINE 20 MG IN NS 100 ML IVPB
20.0000 mg | Freq: Two times a day (BID) | INTRAVENOUS | Status: DC
Start: 2017-11-12 — End: 2017-11-12

## 2017-11-12 MED ORDER — DIPHENHYDRAMINE HCL 50 MG/ML IJ SOLN
25.0000 mg | Freq: Once | INTRAMUSCULAR | Status: AC
  Administered 2017-11-12: 25 mg via INTRAVENOUS

## 2017-11-12 MED ORDER — FAMOTIDINE 20 MG IN NS 100 ML IVPB: 20.0000 mg | Freq: Two times a day (BID) | INTRAVENOUS | Status: DC

## 2017-11-12 NOTE — Patient Instructions (Signed)
[facility] Discharge Instructions for Patients Receiving Chemotherapy  Today you received the following chemotherapy agents Taxol and Herceptin  To help prevent nausea and vomiting after your treatment, we encourage you to take your nausea medication as prescribed.   If you develop nausea and vomiting that is not controlled by your nausea medication, call the clinic.   BELOW ARE SYMPTOMS THAT SHOULD BE REPORTED IMMEDIATELY:  *FEVER GREATER THAN 100.5 F  *CHILLS WITH OR WITHOUT FEVER  NAUSEA AND VOMITING THAT IS NOT CONTROLLED WITH YOUR NAUSEA MEDICATION  *UNUSUAL SHORTNESS OF BREATH  *UNUSUAL BRUISING OR BLEEDING  TENDERNESS IN MOUTH AND THROAT WITH OR WITHOUT PRESENCE OF ULCERS  *URINARY PROBLEMS  *BOWEL PROBLEMS  UNUSUAL RASH Items with * indicate a potential emergency and should be followed up as soon as possible.  Feel free to call the clinic should you have any questions or concerns. The clinic phone number is [phone].  Please show the [facility] at check-in to the Emergency Department and triage nurse.  Paclitaxel injection (Taxol) What is this medicine? PACLITAXEL (PAK li TAX el) is a chemotherapy drug. It targets fast dividing cells, like cancer cells, and causes these cells to die. This medicine is used to treat ovarian cancer, breast cancer, and other cancers. This medicine may be used for other purposes; ask your health care provider or pharmacist if you have questions. COMMON BRAND NAME(S): Onxol, Taxol What should I tell my health care provider before I take this medicine? They need to know if you have any of these conditions: -blood disorders -irregular heartbeat -infection (especially a virus infection such as chickenpox, cold sores, or herpes) -liver disease -previous or ongoing radiation therapy -an unusual or allergic reaction to paclitaxel, alcohol, polyoxyethylated castor oil, other chemotherapy agents, other medicines,  foods, dyes, or preservatives -pregnant or trying to get pregnant -breast-feeding How should I use this medicine? This drug is given as an infusion into a vein. It is administered in a hospital or clinic by a specially trained health care professional. Talk to your pediatrician regarding the use of this medicine in children. Special care may be needed. Overdosage: If you think you have taken too much of this medicine contact a poison control center or emergency room at once. NOTE: This medicine is only for you. Do not share this medicine with others. What if I miss a dose? It is important not to miss your dose. Call your doctor or health care professional if you are unable to keep an appointment. What may interact with this medicine? Do not take this medicine with any of the following medications: -disulfiram -metronidazole This medicine may also interact with the following medications: -cyclosporine -diazepam -ketoconazole -medicines to increase blood counts like filgrastim, pegfilgrastim, sargramostim -other chemotherapy drugs like cisplatin, doxorubicin, epirubicin, etoposide, teniposide, vincristine -quinidine -testosterone -vaccines -verapamil Talk to your doctor or health care professional before taking any of these medicines: -acetaminophen -aspirin -ibuprofen -ketoprofen -naproxen This list may not describe all possible interactions. Give your health care provider a list of all the medicines, herbs, non-prescription drugs, or dietary supplements you use. Also tell them if you smoke, drink alcohol, or use illegal drugs. Some items may interact with your medicine. What should I watch for while using this medicine? Your condition will be monitored carefully while you are receiving this medicine. You will need important blood work done while you are taking this medicine. This medicine can cause serious allergic reactions. To reduce your risk you will  need to take other  medicine(s) before treatment with this medicine. If you experience allergic reactions like skin rash, itching or hives, swelling of the face, lips, or tongue, tell your doctor or health care professional right away. In some cases, you may be given additional medicines to help with side effects. Follow all directions for their use. This drug may make you feel generally unwell. This is not uncommon, as chemotherapy can affect healthy cells as well as cancer cells. Report any side effects. Continue your course of treatment even though you feel ill unless your doctor tells you to stop. Call your doctor or health care professional for advice if you get a fever, chills or sore throat, or other symptoms of a cold or flu. Do not treat yourself. This drug decreases your body's ability to fight infections. Try to avoid being around people who are sick. This medicine may increase your risk to bruise or bleed. Call your doctor or health care professional if you notice any unusual bleeding. Be careful brushing and flossing your teeth or using a toothpick because you may get an infection or bleed more easily. If you have any dental work done, tell your dentist you are receiving this medicine. Avoid taking products that contain aspirin, acetaminophen, ibuprofen, naproxen, or ketoprofen unless instructed by your doctor. These medicines may hide a fever. Do not become pregnant while taking this medicine. Women should inform their doctor if they wish to become pregnant or think they might be pregnant. There is a potential for serious side effects to an unborn child. Talk to your health care professional or pharmacist for more information. Do not breast-feed an infant while taking this medicine. Men are advised not to father a child while receiving this medicine. This product may contain alcohol. Ask your pharmacist or healthcare provider if this medicine contains alcohol. Be sure to tell all healthcare providers you are  taking this medicine. Certain medicines, like metronidazole and disulfiram, can cause an unpleasant reaction when taken with alcohol. The reaction includes flushing, headache, nausea, vomiting, sweating, and increased thirst. The reaction can last from 30 minutes to several hours. What side effects may I notice from receiving this medicine? Side effects that you should report to your doctor or health care professional as soon as possible: -allergic reactions like skin rash, itching or hives, swelling of the face, lips, or tongue -low blood counts - This drug may decrease the number of white blood cells, red blood cells and platelets. You may be at increased risk for infections and bleeding. -signs of infection - fever or chills, cough, sore throat, pain or difficulty passing urine -signs of decreased platelets or bleeding - bruising, pinpoint red spots on the skin, black, tarry stools, nosebleeds -signs of decreased red blood cells - unusually weak or tired, fainting spells, lightheadedness -breathing problems -chest pain -high or low blood pressure -mouth sores -nausea and vomiting -pain, swelling, redness or irritation at the injection site -pain, tingling, numbness in the hands or feet -slow or irregular heartbeat -swelling of the ankle, feet, hands Side effects that usually do not require medical attention (report to your doctor or health care professional if they continue or are bothersome): -bone pain -complete hair loss including hair on your head, underarms, pubic hair, eyebrows, and eyelashes -changes in the color of fingernails -diarrhea -loosening of the fingernails -loss of appetite -muscle or joint pain -red flush to skin -sweating This list may not describe all possible side effects. Call your doctor for medical  advice about side effects. You may report side effects to FDA at 1-800-FDA-1088. Where should I keep my medicine? This drug is given in a hospital or clinic and  will not be stored at home. NOTE: This sheet is a summary. It may not cover all possible information. If you have questions about this medicine, talk to your doctor, pharmacist, or health care provider.  2018 Elsevier/Gold Standard (2015-07-16 19:58:00)  Trastuzumab injection for infusion (Herceptin) What is this medicine? TRASTUZUMAB (tras TOO zoo mab) is a monoclonal antibody. It is used to treat breast cancer and stomach cancer. This medicine may be used for other purposes; ask your health care provider or pharmacist if you have questions. COMMON BRAND NAME(S): Herceptin What should I tell my health care provider before I take this medicine? They need to know if you have any of these conditions: -heart disease -heart failure -lung or breathing disease, like asthma -an unusual or allergic reaction to trastuzumab, benzyl alcohol, or other medications, foods, dyes, or preservatives -pregnant or trying to get pregnant -breast-feeding How should I use this medicine? This drug is given as an infusion into a vein. It is administered in a hospital or clinic by a specially trained health care professional. Talk to your pediatrician regarding the use of this medicine in children. This medicine is not approved for use in children. Overdosage: If you think you have taken too much of this medicine contact a poison control center or emergency room at once. NOTE: This medicine is only for you. Do not share this medicine with others. What if I miss a dose? It is important not to miss a dose. Call your doctor or health care professional if you are unable to keep an appointment. What may interact with this medicine? This medicine may interact with the following medications: -certain types of chemotherapy, such as daunorubicin, doxorubicin, epirubicin, and idarubicin This list may not describe all possible interactions. Give your health care provider a list of all the medicines, herbs, non-prescription  drugs, or dietary supplements you use. Also tell them if you smoke, drink alcohol, or use illegal drugs. Some items may interact with your medicine. What should I watch for while using this medicine? Visit your doctor for checks on your progress. Report any side effects. Continue your course of treatment even though you feel ill unless your doctor tells you to stop. Call your doctor or health care professional for advice if you get a fever, chills or sore throat, or other symptoms of a cold or flu. Do not treat yourself. Try to avoid being around people who are sick. You may experience fever, chills and shaking during your first infusion. These effects are usually mild and can be treated with other medicines. Report any side effects during the infusion to your health care professional. Fever and chills usually do not happen with later infusions. Do not become pregnant while taking this medicine or for 7 months after stopping it. Women should inform their doctor if they wish to become pregnant or think they might be pregnant. Women of child-bearing potential will need to have a negative pregnancy test before starting this medicine. There is a potential for serious side effects to an unborn child. Talk to your health care professional or pharmacist for more information. Do not breast-feed an infant while taking this medicine or for 7 months after stopping it. Women must use effective birth control with this medicine. What side effects may I notice from receiving this medicine? Side effects that  you should report to your doctor or health care professional as soon as possible: -allergic reactions like skin rash, itching or hives, swelling of the face, lips, or tongue -chest pain or palpitations -cough -dizziness -feeling faint or lightheaded, falls -fever -general ill feeling or flu-like symptoms -signs of worsening heart failure like breathing problems; swelling in your legs and feet -unusually weak or  tired Side effects that usually do not require medical attention (report to your doctor or health care professional if they continue or are bothersome): -bone pain -changes in taste -diarrhea -joint pain -nausea/vomiting -weight loss This list may not describe all possible side effects. Call your doctor for medical advice about side effects. You may report side effects to FDA at 1-800-FDA-1088. Where should I keep my medicine? This drug is given in a hospital or clinic and will not be stored at home. NOTE: This sheet is a summary. It may not cover all possible information. If you have questions about this medicine, talk to your doctor, pharmacist, or health care provider.  2018 Elsevier/Gold Standard (2016-09-08 14:37:52)

## 2017-11-12 NOTE — Progress Notes (Signed)
Patient is to receive Taxol and Herceptin today per Dr. Jana Hakim. Will notify pharmacy to adjust treatment date.    First time Taxol started at 1439. Patient reported severe back pain at 1447. Infusion stopped. Lucianne Lei, PA called to assess pain. Per Lucianne Lei patient given Benadryl 41m IV. Pain improved. Patient restarted on Taxol at 1456. Per VLucianne Leithe back pain is unlikely from the Taxol.  1506 Patient reported scratchy throat, nausea. Patient appeared flushed, eyes bloodshot, red patches on her legs, itchy hands and feet. Taxol stopped, normal saline hung to gravity, 2L oxygen started. Hypersensitivity protocol started. VLucianne Lei PA called [facility] to infusion [facility]   Patient stable. Taxol restarted original rate at 1540 per [place] Patient tolerated Taxol and was able to complete. VS stable, patient discharged home with sister and husband.

## 2017-11-12 NOTE — Telephone Encounter (Signed)
Gave avs and calendar for February and march °

## 2017-11-13 ENCOUNTER — Telehealth: Payer: Self-pay

## 2017-11-13 NOTE — Telephone Encounter (Signed)
Per Dr. Jana Hakim it is ok to give goserelin injection on March 1st after treatment.  Cyndia Bent RN

## 2017-11-13 NOTE — Telephone Encounter (Signed)
Called patient to check on patient after her 1st time treatment with Taxol. Patient had a sensitivity reaction during her treatment yesterday. Patient stated when she was at home last night she began to have red blotches on her skin on her chest, arms, hands, and stomach. She had a headache and slight nausea. After taking compazine and tylenol symptoms began to improve. Patient is doing well this morning although still has slight headache. She is experiencing some bone pain and I encouraged her to try Claritin for this. She is taking compazine every 6 hours and tylenol as well. She went for a walk and exercised last night. She has a good appetite. She is to call if she anything else arises.  Cyndia Bent RN

## 2017-11-15 NOTE — Progress Notes (Signed)
Symptoms Management Clinic Progress Note   Hannah Woodard [id number] [birth year] 47 y.o.  Hannah Woodard is managed by Dr. Shirlean Mylar Corliss Skains presents for:  Chemotherapy:  yes        Monoclonal Antibody: yes       Immunoglobulin: no       Bisphosphonate: no        Transfusion:  no     Current Therapy: trastuzumab, paclitaxel and goserelin injections   Hannah Woodard was receiving paclitaxel at the time of her reaction.  First dose of trastuzumab and paclitaxel : yes  Assessment: Plan:    Infusion reaction, initial encounter  Hannah Woodard was seen in the infusion room for a suspected infusion reaction. She was receiving paclitaxel at the time of her reaction. Her symptoms included: Lower back pain, facial and chest flushing. She was premedicated with dexamethasone, Benadryl, Tylenol, and Pepcid prior to starting her infusion. Taxol was paused and Hannah Woodard was given Benadryl 25 mg IV x2 and Solu-Medrol 125 mg IV x1 after onset of her symptoms. Hannah Woodard did  respond to intervention.  The patient's Taxol was restarted.  She was able to complete her infusion without further symptoms.  Please see After Visit Summary for patient specific instructions.  Future Appointments  Date Time Provider [place]  11/19/2017  9:00 AM Gardenia Phlegm, NP [facility] None  11/19/2017  9:45 AM [facility] LAB 4 [facility] None  11/19/2017 10:00 AM [facility] J32 DNS [facility] None  11/19/2017 11:00 AM [facility] B4 [facility] None  11/26/2017  9:45 AM [facility] LAB 4 [facility] None  11/26/2017 10:00 AM [facility] FLUSH NURSE 2 [facility] None  11/26/2017 11:00 AM [facility] H30 [facility] None  12/03/2017  9:00 AM Magrinat, Virgie Dad, MD [facility] None  12/03/2017  9:45 AM [facility] LAB 4 [facility] None  12/03/2017 10:00 AM [facility] FLUSH NURSE 2 [facility] None  12/03/2017 11:00 AM [facility] D12 [facility] None    12/09/2017  1:00 PM [facility] ECHO 1-BUZZ [facility]  12/09/2017  2:00 PM Bensimhon, Shaune Pascal, MD [facility] None  12/10/2017  9:45 AM [facility] LAB 5 [facility] None  12/10/2017 10:00 AM [facility] FLUSH NURSE 2 [facility] None  12/10/2017 11:00 AM [facility] C9 [facility] None  12/17/2017  9:45 AM [facility] LAB 4 [facility] None  12/17/2017 10:00 AM [facility] FLUSH NURSE 2 [facility] None  12/17/2017 11:00 AM [facility] A2 [facility] None  12/20/2017  2:00 PM Vicie Mutters, PA-C [facility] None  12/24/2017  9:45 AM [facility] LAB 6 [facility] None  12/24/2017 10:00 AM [facility] FLUSH NURSE 2 [facility] None  12/24/2017 11:00 AM [facility] B7 [facility] None    No orders of the defined types were placed in this encounter.      Subjective:   Patient ID:  Hannah Woodard is a 47 y.o. (DOB 1970/10/26) female.  Chief Complaint: No chief complaint on file.   HPI Hannah Woodard was seen in the infusion room for a suspected infusion reaction. She was receiving paclitaxel at the time of her reaction. Her symptoms included: Lower back pain, facial and chest flushing. She was premedicated with dexamethasone, Benadryl, Tylenol, and Pepcid prior to starting her infusion. Taxol was paused and Hannah Woodard was given Benadryl 25 mg IV x2 and Solu-Medrol 125 mg IV x1 after onset of her symptoms. Hannah Woodard did  respond to intervention.  The patient's Taxol was restarted.  She was able to complete her infusion without further symptoms.  Medications: I have reviewed the patient's current medications.  Allergies:  Allergies  Allergen Reactions  . Milk-Related Compounds Diarrhea    Stomach cramping  . Poison Ivy Extract [Poison Ivy Extract] Hives, Itching and Rash    Past Medical History:  Diagnosis Date  . Breast cancer ([place])   . Family history of ovarian cancer 09/02/2017  . Family history of prostate cancer in father 09/02/2017  . Family history of uterine cancer  09/02/2017  . Plantar fasciitis     No past surgical history on file.  Family History  Problem Relation Age of Onset  . Ovarian cancer Mother 45  . Prostate cancer Father 64       'high gleason' unsure number  . Ulcerative colitis Sister   . Other Sister        abn ovaian growth, partial hysterectomy  . Uterine cancer Maternal Aunt 14  . Stroke Maternal Aunt 66  . Basal cell carcinoma Brother   . Skin cancer Paternal Uncle   . Skin cancer Maternal Grandmother   . Stroke Paternal Grandfather 75  . Hydrocephalus Cousin     Social History   Socioeconomic History  . Marital status: Single    Spouse name: Not on file  . Number of children: Not on file  . Years of education: Not on file  . Highest education level: Not on file  Social Needs  . Financial resource strain: Not on file  . Food insecurity - worry: Not on file  . Food insecurity - inability: Not on file  . Transportation needs - medical: Not on file  . Transportation needs - non-medical: Not on file  Occupational History  . Not on file  Tobacco Use  . Smoking status: Never Smoker  . Smokeless tobacco: Never Used  Substance and Sexual Activity  . Alcohol use: Yes  . Drug use: No  . Sexual activity: Not on file  Other Topics Concern  . Not on file  Social History Narrative  . Not on file    Past Medical History, Surgical history, Social history, and Family history were reviewed and updated as appropriate.   Please see review of systems for further details on the patient's review from today.   Review of Systems:  Review of Systems  Constitutional: Negative for activity change.  HENT: Negative for trouble swallowing.   Respiratory: Negative for cough, choking, chest tightness, shortness of breath and wheezing.   Cardiovascular: Negative for chest pain and palpitations.  Gastrointestinal: Negative for nausea and vomiting.  Musculoskeletal: Positive for back pain.  Skin:       Flushing of the face and  chest  Neurological: Negative for speech difficulty and light-headedness.    Objective:   Physical Exam:  There were no vitals taken for this visit.  Physical Exam  Constitutional: No distress.  HENT:  Head: Normocephalic and atraumatic.  Cardiovascular: Normal rate, regular rhythm and normal heart sounds. Exam reveals no gallop and no friction rub.  No murmur heard. Pulmonary/Chest: Effort normal and breath sounds normal. No respiratory distress. She has no wheezes. She has no rales.  Neurological: She is alert.  Skin: Skin is warm and dry. She is not diaphoretic. There is erythema.  Flushing of the chest, neck, and face.    Lab Review:     Component Value Date/Time   NA 143 11/12/2017 0844   NA 138 09/01/2017 1238   K 3.7 11/12/2017 0844   K 4.4 09/01/2017 1238   CL 107  11/12/2017 0844   CO2 26 11/12/2017 0844   CO2 25 09/01/2017 1238   GLUCOSE 90 11/12/2017 0844   GLUCOSE 83 09/01/2017 1238   BUN 12 11/12/2017 0844   BUN 9.2 09/01/2017 1238   CREATININE 0.67 11/12/2017 0844   CREATININE 0.7 09/01/2017 1238   CALCIUM 9.6 11/12/2017 0844   CALCIUM 9.4 09/01/2017 1238   PROT 6.7 11/12/2017 0844   PROT 7.4 09/01/2017 1238   ALBUMIN 4.1 11/12/2017 0844   ALBUMIN 4.2 09/01/2017 1238   AST 19 11/12/2017 0844   AST 21 09/01/2017 1238   ALT 18 11/12/2017 0844   ALT 21 09/01/2017 1238   ALKPHOS 50 11/12/2017 0844   ALKPHOS 60 09/01/2017 1238   BILITOT 0.7 11/12/2017 0844   BILITOT 0.65 09/01/2017 1238   GFRNONAA >60 11/12/2017 0844   GFRNONAA 111 05/18/2017 0928   GFRAA >60 11/12/2017 0844   GFRAA 128 05/18/2017 0928       Component Value Date/Time   WBC 4.5 11/12/2017 0844   WBC 7.0 09/01/2017 1238   WBC 7.2 05/18/2017 0928   RBC 3.78 11/12/2017 0844   HGB 13.7 09/01/2017 1238   HCT 37.6 11/12/2017 0844   HCT 41.0 09/01/2017 1238   PLT 210 11/12/2017 0844   PLT 273 09/01/2017 1238   MCV 99.5 11/12/2017 0844   MCV 98.8 09/01/2017 1238   MCH 33.3  11/12/2017 0844   MCHC 33.5 11/12/2017 0844   RDW 13.8 11/12/2017 0844   RDW 13.8 09/01/2017 1238   LYMPHSABS 1.1 11/12/2017 0844   LYMPHSABS 1.5 09/01/2017 1238   MONOABS 0.4 11/12/2017 0844   MONOABS 0.6 09/01/2017 1238   EOSABS 0.4 11/12/2017 0844   EOSABS 0.1 09/01/2017 1238   BASOSABS 0.0 11/12/2017 0844   BASOSABS 0.0 09/01/2017 1238   -------------------------------  Imaging from last 24 hours (if applicable):  Radiology interpretation: No results found.

## 2017-11-16 ENCOUNTER — Ambulatory Visit: Payer: BC Managed Care – PPO

## 2017-11-16 ENCOUNTER — Other Ambulatory Visit: Payer: BC Managed Care – PPO

## 2017-11-19 ENCOUNTER — Inpatient Hospital Stay: Payer: BC Managed Care – PPO

## 2017-11-19 ENCOUNTER — Encounter: Payer: Self-pay | Admitting: Adult Health

## 2017-11-19 ENCOUNTER — Other Ambulatory Visit: Payer: Self-pay

## 2017-11-19 ENCOUNTER — Inpatient Hospital Stay (HOSPITAL_BASED_OUTPATIENT_CLINIC_OR_DEPARTMENT_OTHER): Payer: BC Managed Care – PPO | Admitting: Adult Health

## 2017-11-19 ENCOUNTER — Encounter: Payer: Self-pay | Admitting: *Deleted

## 2017-11-19 VITALS — BP 118/73 | HR 80 | Temp 98.5°F | Resp 18 | Ht 66.0 in | Wt 152.3 lb

## 2017-11-19 DIAGNOSIS — H04129 Dry eye syndrome of unspecified lacrimal gland: Secondary | ICD-10-CM | POA: Diagnosis not present

## 2017-11-19 DIAGNOSIS — K5900 Constipation, unspecified: Secondary | ICD-10-CM

## 2017-11-19 DIAGNOSIS — Z170 Estrogen receptor positive status [ER+]: Secondary | ICD-10-CM

## 2017-11-19 DIAGNOSIS — Z808 Family history of malignant neoplasm of other organs or systems: Secondary | ICD-10-CM

## 2017-11-19 DIAGNOSIS — C50211 Malignant neoplasm of upper-inner quadrant of right female breast: Secondary | ICD-10-CM

## 2017-11-19 DIAGNOSIS — Z8042 Family history of malignant neoplasm of prostate: Secondary | ICD-10-CM | POA: Diagnosis not present

## 2017-11-19 DIAGNOSIS — Z95828 Presence of other vascular implants and grafts: Secondary | ICD-10-CM

## 2017-11-19 DIAGNOSIS — M549 Dorsalgia, unspecified: Secondary | ICD-10-CM | POA: Diagnosis not present

## 2017-11-19 DIAGNOSIS — F418 Other specified anxiety disorders: Secondary | ICD-10-CM | POA: Diagnosis not present

## 2017-11-19 DIAGNOSIS — Z8041 Family history of malignant neoplasm of ovary: Secondary | ICD-10-CM | POA: Diagnosis not present

## 2017-11-19 DIAGNOSIS — Z79899 Other long term (current) drug therapy: Secondary | ICD-10-CM | POA: Diagnosis not present

## 2017-11-19 LAB — CMP (CANCER CENTER ONLY)
ALT: 26 U/L (ref 0–55)
AST: 17 U/L (ref 5–34)
Albumin: 4 g/dL (ref 3.5–5.0)
Alkaline Phosphatase: 51 U/L (ref 40–150)
Anion gap: 9 (ref 3–11)
BUN: 15 mg/dL (ref 7–26)
CHLORIDE: 105 mmol/L (ref 98–109)
CO2: 27 mmol/L (ref 22–29)
CREATININE: 0.69 mg/dL (ref 0.60–1.10)
Calcium: 9.6 mg/dL (ref 8.4–10.4)
Glucose, Bld: 92 mg/dL (ref 70–140)
POTASSIUM: 3.8 mmol/L (ref 3.5–5.1)
Sodium: 141 mmol/L (ref 136–145)
Total Bilirubin: 0.5 mg/dL (ref 0.2–1.2)
Total Protein: 6.7 g/dL (ref 6.4–8.3)

## 2017-11-19 LAB — CBC WITH DIFFERENTIAL (CANCER CENTER ONLY)
BASOS ABS: 0 10*3/uL (ref 0.0–0.1)
Basophils Relative: 0 %
EOS ABS: 0.4 10*3/uL (ref 0.0–0.5)
EOS PCT: 8 %
HCT: 34.6 % — ABNORMAL LOW (ref 34.8–46.6)
Hemoglobin: 11.7 g/dL (ref 11.6–15.9)
LYMPHS ABS: 1.4 10*3/uL (ref 0.9–3.3)
LYMPHS PCT: 26 %
MCH: 33.5 pg (ref 25.1–34.0)
MCHC: 33.8 g/dL (ref 31.5–36.0)
MCV: 99.1 fL (ref 79.5–101.0)
Monocytes Absolute: 0.2 10*3/uL (ref 0.1–0.9)
Monocytes Relative: 4 %
NEUTROS PCT: 62 %
Neutro Abs: 3.3 10*3/uL (ref 1.5–6.5)
PLATELETS: 253 10*3/uL (ref 145–400)
RBC: 3.49 MIL/uL — AB (ref 3.70–5.45)
RDW: 13.5 % (ref 11.2–14.5)
WBC: 5.3 10*3/uL (ref 3.9–10.3)

## 2017-11-19 MED ORDER — SODIUM CHLORIDE 0.9 % IV SOLN
Freq: Once | INTRAVENOUS | Status: AC
  Administered 2017-11-19: 11:00:00 via INTRAVENOUS

## 2017-11-19 MED ORDER — DIPHENHYDRAMINE HCL 50 MG/ML IJ SOLN
INTRAMUSCULAR | Status: AC
  Filled 2017-11-19: qty 1

## 2017-11-19 MED ORDER — SODIUM CHLORIDE 0.9 % IV SOLN
80.0000 mg/m2 | Freq: Once | INTRAVENOUS | Status: AC
  Administered 2017-11-19: 150 mg via INTRAVENOUS
  Filled 2017-11-19: qty 25

## 2017-11-19 MED ORDER — SODIUM CHLORIDE 0.9% FLUSH
10.0000 mL | INTRAVENOUS | Status: DC | PRN
  Administered 2017-11-19: 10 mL
  Filled 2017-11-19: qty 10

## 2017-11-19 MED ORDER — FAMOTIDINE IN NACL 20-0.9 MG/50ML-% IV SOLN: 20.0000 mg | Freq: Once | INTRAVENOUS | Status: DC

## 2017-11-19 MED ORDER — DEXAMETHASONE SODIUM PHOSPHATE 10 MG/ML IJ SOLN
10.0000 mg | Freq: Once | INTRAMUSCULAR | Status: AC
  Administered 2017-11-19: 10 mg via INTRAVENOUS

## 2017-11-19 MED ORDER — DEXAMETHASONE SODIUM PHOSPHATE 10 MG/ML IJ SOLN
INTRAMUSCULAR | Status: AC
  Filled 2017-11-19: qty 1

## 2017-11-19 MED ORDER — SODIUM CHLORIDE 0.9% FLUSH
10.0000 mL | Freq: Once | INTRAVENOUS | Status: AC
  Administered 2017-11-19: 10 mL
  Filled 2017-11-19: qty 10

## 2017-11-19 MED ORDER — DIPHENHYDRAMINE HCL 50 MG/ML IJ SOLN
25.0000 mg | Freq: Once | INTRAMUSCULAR | Status: AC
  Administered 2017-11-19: 25 mg via INTRAVENOUS

## 2017-11-19 MED ORDER — SODIUM CHLORIDE 0.9 % IV SOLN
20.0000 mg | Freq: Once | INTRAVENOUS | Status: AC
  Administered 2017-11-19: 20 mg via INTRAVENOUS
  Filled 2017-11-19: qty 2

## 2017-11-19 MED ORDER — HEPARIN SOD (PORK) LOCK FLUSH 100 UNIT/ML IV SOLN
500.0000 [IU] | Freq: Once | INTRAVENOUS | Status: AC | PRN
  Administered 2017-11-19: 500 [IU]
  Filled 2017-11-19: qty 5

## 2017-11-19 NOTE — Patient Instructions (Signed)
Implanted Port Home Guide An implanted port is a type of central line that is placed under the skin. Central lines are used to provide IV access when treatment or nutrition needs to be given through a person's veins. Implanted ports are used for long-term IV access. An implanted port may be placed because:  You need IV medicine that would be irritating to the small veins in your hands or arms.  You need long-term IV medicines, such as antibiotics.  You need IV nutrition for a long period.  You need frequent blood draws for lab tests.  You need dialysis.  Implanted ports are usually placed in the chest area, but they can also be placed in the upper arm, the abdomen, or the leg. An implanted port has two main parts:  Reservoir. The reservoir is round and will appear as a small, raised area under your skin. The reservoir is the part where a needle is inserted to give medicines or draw blood.  Catheter. The catheter is a thin, flexible tube that extends from the reservoir. The catheter is placed into a large vein. Medicine that is inserted into the reservoir goes into the catheter and then into the vein.  How will I care for my incision site? Do not get the incision site wet. Bathe or shower as directed by your health care provider. How is my port accessed? Special steps must be taken to access the port:  Before the port is accessed, a numbing cream can be placed on the skin. This helps numb the skin over the port site.  Your health care provider uses a sterile technique to access the port. ? Your health care provider must put on a mask and sterile gloves. ? The skin over your port is cleaned carefully with an antiseptic and allowed to dry. ? The port is gently pinched between sterile gloves, and a needle is inserted into the port.  Only "non-coring" port needles should be used to access the port. Once the port is accessed, a blood return should be checked. This helps ensure that the port  is in the vein and is not clogged.  If your port needs to remain accessed for a constant infusion, a clear (transparent) bandage will be placed over the needle site. The bandage and needle will need to be changed every week, or as directed by your health care provider.  Keep the bandage covering the needle clean and dry. Do not get it wet. Follow your health care provider's instructions on how to take a shower or bath while the port is accessed.  If your port does not need to stay accessed, no bandage is needed over the port.  What is flushing? Flushing helps keep the port from getting clogged. Follow your health care provider's instructions on how and when to flush the port. Ports are usually flushed with saline solution or a medicine called heparin. The need for flushing will depend on how the port is used.  If the port is used for intermittent medicines or blood draws, the port will need to be flushed: ? After medicines have been given. ? After blood has been drawn. ? As part of routine maintenance.  If a constant infusion is running, the port may not need to be flushed.  How long will my port stay implanted? The port can stay in for as long as your health care provider thinks it is needed. When it is time for the port to come out, surgery will be   done to remove it. The procedure is similar to the one performed when the port was put in. When should I seek immediate medical care? When you have an implanted port, you should seek immediate medical care if:  You notice a bad smell coming from the incision site.  You have swelling, redness, or drainage at the incision site.  You have more swelling or pain at the port site or the surrounding area.  You have a fever that is not controlled with medicine.  This information is not intended to replace advice given to you by your health care provider. Make sure you discuss any questions you have with your health care provider. Document  Released: 09/14/2005 Document Revised: 02/20/2016 Document Reviewed: 05/22/2013 Elsevier Interactive Patient Education  2017 Elsevier Inc.  

## 2017-11-19 NOTE — Progress Notes (Signed)
I s/w pt re: supplements at the request of Wilber Bihari, NP. Overview - (reference: Natural Standard Database) Screened all supplements/premeds/home meds/chemo listed in current meds and found most concern around: Echinacea - possible 3A4 inducer & 1A2 inhibitor Green Tea - OATP (organic anion transporting polypeptide) inhibitor Turmeric - 3A4 inhibitor & may inhibit p-glycoprotein (minor) CBD Oil - 2B6 inhibitor, 1A2 inhibitor & 3A4 inhibitor I provided detailed handout re: CYP 450 enzyme system.  We reviewed the difference b/c inducers & inhibitors & substrates, in general.  Dexamethasone - 3A4 substrate, 2B6 substrate Ondansetron - 1A2 substrate, 2D6 substrate Paclitaxel - 3A4 substrate, 2C8 substrate, OATP substrate, p-glycoprotein substrate (minor).  To summarize, pt will take advice of Dr. Jana Hakim: do not take supplements (mainly Echinacea, Green Tea, urmeric & CBD Oil) on chemo day and through the weekend. She will monitor for increased side effects/blood counts as tol.  Kennith Center, Pharm.D., CPP 11/19/2017@2 :14 PM

## 2017-11-19 NOTE — Patient Instructions (Signed)
[facility] Discharge Instructions for Patients Receiving Chemotherapy  Today you received the following chemotherapy agents Taxol.  To help prevent nausea and vomiting after your treatment, we encourage you to take your nausea medication as directed BUT NO ZOFRAN FOR 3 DAYS POST CHEMO.   If you develop nausea and vomiting that is not controlled by your nausea medication, call the clinic.   BELOW ARE SYMPTOMS THAT SHOULD BE REPORTED IMMEDIATELY:  *FEVER GREATER THAN 100.5 F  *CHILLS WITH OR WITHOUT FEVER  NAUSEA AND VOMITING THAT IS NOT CONTROLLED WITH YOUR NAUSEA MEDICATION  *UNUSUAL SHORTNESS OF BREATH  *UNUSUAL BRUISING OR BLEEDING  TENDERNESS IN MOUTH AND THROAT WITH OR WITHOUT PRESENCE OF ULCERS  *URINARY PROBLEMS  *BOWEL PROBLEMS  UNUSUAL RASH Items with * indicate a potential emergency and should be followed up as soon as possible.  Feel free to call the clinic you have any questions or concerns. The clinic phone number is [phone].  Please show the [facility] at check-in to the Emergency Department and triage nurse.

## 2017-11-19 NOTE — Progress Notes (Signed)
[facility]  [phone] [phone]     ID: Hannah Woodard DOB: 12/26/1970  MR#: [id number]  TOI#:[id number]  Patient Care Team: Unk Pinto, MD as PCP - General (Internal Medicine) Jovita Kussmaul, MD as Consulting Physician (General Surgery) Magrinat, Virgie Dad, MD as Consulting Physician (Oncology) Kyung Rudd, MD as Consulting Physician (Radiation Oncology) Lorelle Gibbs, MD (Radiology) Howard-McNatt, Mable Fill, MD as Referring Physician (Surgery) OTHER MD:  CHIEF COMPLAINT: Triple positive breast cancer  CURRENT TREATMENT: Adjuvant chemotherapy  INTERVAL HISTORY: Hannah Woodard returns today for follow-up and treatment of her triple positive breast cancer accompanied by her husband. She receives goserelin injections every 28 days.     She receives trastuzumab and paclitaxel started on 11/12/2017.  REVIEW OF SYSTEMS: Hannah Woodard is doing well today.  She tolerated her first round of Paclitaxel and Trastuzumab pretty well.  She did have some mild back pain, but was quickly assessed and received additional benadryl and solumedrol, and her Paclitaxel was restarted without any issues.  She has had a good week for the most part.  She is taking Compazine.  She notes mild constipation that she is managing.  She denies neuropathy.  She had some joint aches and pains following the Paclitaxel that have since resolved.  She has had some mild dry eyes that she is using Visine for.    Since her last appointment with [place], she had an appointment with a radiation oncologist at [facility].  She is also planning on undergoing consultation with Dr. Lisbeth Renshaw, and then deciding where she would like to receive her radiation.  She also notes that there is an integrative medicine clinic at [facility] and she is looking into going there for acupuncture and dry needling to help relieve her hot flashes.    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  Hannah Woodard takes many different vitamins and supplements.  A detailed ROS was otherwise non contributory at today's appointment.   HISTORY OF CURRENT ILLNESS: From the original intake note:  The patient has a history of cysts and nodules in the right breast which have been closely followed, with exams 07/30/2016 and 01/28/2017.  On 07/29/2017 follow-up right breast ultrasonography showed no findings of concern.  Bilateral diagnostic mammography 08/18/2017 with right breast ultrasonography on 08/18/2017 at [facility] found the breast density to be category C.  There was now a new irregular high density mass in the posterior portion of the right breast seen on the mediolateral oblique view only.  Ultrasonography confirmed a 1.4 cm lobulated mass in the upper inner quadrant of the right breast 10 cm from the nipple associated with pectoral muscle invasion.  The right axilla was sonographically benign.  Biopsy of this mass 08/24/2017 showed ([id number]) invasive ductal carcinoma, grade 2, estrogen receptor 95% positive, progesterone receptor 100% positive, both with strong staining intensity, with an MIB-1 of 60%, and HER-2 amplified, with a signals ratio of 2.84 (full report pending).  The patient's subsequent history is as detailed below.  PAST MEDICAL HISTORY: Past Medical History:  Diagnosis Date  . Breast cancer ([place])   . Family history of ovarian cancer 09/02/2017  . Family history of prostate cancer in father 09/02/2017  . Family history of uterine cancer 09/02/2017  . Plantar fasciitis     PAST SURGICAL HISTORY: No past surgical history on file.  FAMILY HISTORY Family History  Problem Relation Age of Onset  . Ovarian cancer Mother 71  . Prostate cancer Father 36       'high gleason' unsure number  . Ulcerative colitis Sister   . Other Sister        abn ovaian growth, partial hysterectomy   . Uterine cancer Maternal Aunt 81  . Stroke Maternal Aunt 66  . Basal cell carcinoma Brother   . Skin cancer Paternal Uncle   . Skin cancer Maternal Grandmother   . Stroke Paternal Grandfather 41  . Hydrocephalus Cousin   The patient's father died from alcoholic cirrhosis at age 68.  He had been diagnosed with prostate cancer at age 57.  The patient's mother was diagnosed with ovarian cancer at age 27 and died a year later.  The patient has 1 brother, 1 sister.  The patient's brother has had basal cell and other skin cancers.  A paternal aunt had uterine cancer.  Other relatives have had skin cancers but the patient does not know if these were melanomas or not  GYNECOLOGIC HISTORY:  No LMP recorded.  Menarche age 21, the patient has never been pregnant.  She is still having regular periods.  She used oral contraceptives briefly in the past without complications. She and her husband are very interested in having children, and currently (December 2018) they are not using any contraception.   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  Her husband Rodman Key (goes by [employer]") is a Dealer.  At home it's just them, a [employer] and 2 cats.    ADVANCED DIRECTIVES: Not in place   HEALTH MAINTENANCE: Social History   Tobacco Use  . Smoking status: Never Smoker  . Smokeless tobacco: Never Used  Substance Use Topics  . Alcohol use: Yes  . Drug use: No     Colonoscopy: Never  PAP:  Bone density: Never   Allergies  Allergen Reactions  . Milk-Related Compounds Diarrhea    Stomach cramping  . Poison Ivy Extract [Poison Ivy Extract] Hives, Itching and Rash    Current Outpatient Medications  Medication Sig Dispense Refill  . b complex vitamins tablet Take 1 tablet by mouth daily.    . Cholecalciferol (VITAMIN D) 2000 UNITS tablet Take 10,000 Units by mouth daily.     . Cholecalciferol (VITAMIN D) 2000 units  tablet Take by mouth.    . ECHINACEA PO Take by mouth.    . Ferrous Sulfate (IRON) 325 (65 Fe) MG TABS Take by mouth daily.    . fish oil-omega-3 fatty acids 1000 MG capsule Take 1 g by mouth daily.    Nyoka Cowden Tea 250 MG CAPS Take by mouth 2 (two) times daily.    . Iodine Strong, Lugols, (STRONG IODINE) 5 % solution Take 0.2 mLs by mouth 3 (three) times daily. 480 mL 0  . lidocaine-prilocaine (EMLA) cream Apply to affected area once (Patient not taking: Reported on 10/27/2017) 30 g 3  . ondansetron (ZOFRAN ODT) 8 MG disintegrating tablet 38m ODT q6 hours prn nausea 30 tablet 0  . ondansetron (ZOFRAN) 8 MG tablet Take 1 tablet (8 mg total) by mouth 2 (two) times daily as needed (Nausea or vomiting). (Patient not taking: Reported on 10/27/2017) 30 tablet 1  . prochlorperazine (COMPAZINE) 10 MG tablet Take 1 tablet (10 mg total) by mouth every 6 (six) hours as needed (Nausea or vomiting). (Patient not taking: Reported on  10/27/2017) 30 tablet 1  . TURMERIC PO Take by mouth.    Marland Kitchen UNABLE TO FIND [place] T[place] mushroom 500 mg    . UNABLE TO FIND [place] Mushroom 500 mg daily    . UNABLE TO FIND CBD Oil; patient takes for nausea    . vitamin C (ASCORBIC ACID) 500 MG tablet Take 500 mg by mouth 2 (two) times daily.    . Zinc Acetate 50 MG CAPS Take by mouth.     No current facility-administered medications for this visit.     OBJECTIVE:  Vitals:   11/19/17 0855  BP: 118/73  Pulse: 80  Resp: 18  Temp: 98.5 F (36.9 C)  SpO2: 99%     Body mass index is 24.58 kg/m.   Wt Readings from Last 3 Encounters:  11/19/17 152 lb 4.8 oz (69.1 kg)  11/12/17 151 lb 12.8 oz (68.9 kg)  10/27/17 153 lb 11.2 oz (69.7 kg)  ECOG FS:1 - Symptomatic but completely ambulatory GENERAL: Patient is a well appearing female in no acute distress HEENT:  Sclerae anicteric.  Oropharynx clear and moist. No ulcerations or evidence of oropharyngeal candidiasis. Neck is supple.  NODES:  No cervical, supraclavicular, or  axillary lymphadenopathy palpated.  BREAST EXAM:  Deferred. LUNGS:  Clear to auscultation bilaterally.  No wheezes or rhonchi. HEART:  Regular rate and rhythm. No murmur appreciated. ABDOMEN:  Soft, nontender.  Positive, normoactive bowel sounds. No organomegaly palpated. MSK:  No focal spinal tenderness to palpation. Full range of motion bilaterally in the upper extremities. EXTREMITIES:  No peripheral edema.   SKIN:  Clear with no obvious rashes or skin changes. No nail dyscrasia. NEURO:  Nonfocal. Well oriented.  Appropriate affect.    LAB RESULTS:  CMP     Component Value Date/Time   NA 143 11/12/2017 0844   NA 138 09/01/2017 1238   K 3.7 11/12/2017 0844   K 4.4 09/01/2017 1238   CL 107 11/12/2017 0844   CO2 26 11/12/2017 0844   CO2 25 09/01/2017 1238   GLUCOSE 90 11/12/2017 0844   GLUCOSE 83 09/01/2017 1238   BUN 12 11/12/2017 0844   BUN 9.2 09/01/2017 1238   CREATININE 0.67 11/12/2017 0844   CREATININE 0.7 09/01/2017 1238   CALCIUM 9.6 11/12/2017 0844   CALCIUM 9.4 09/01/2017 1238   PROT 6.7 11/12/2017 0844   PROT 7.4 09/01/2017 1238   ALBUMIN 4.1 11/12/2017 0844   ALBUMIN 4.2 09/01/2017 1238   AST 19 11/12/2017 0844   AST 21 09/01/2017 1238   ALT 18 11/12/2017 0844   ALT 21 09/01/2017 1238   ALKPHOS 50 11/12/2017 0844   ALKPHOS 60 09/01/2017 1238   BILITOT 0.7 11/12/2017 0844   BILITOT 0.65 09/01/2017 1238   GFRNONAA >60 11/12/2017 0844   GFRNONAA 111 05/18/2017 0928   GFRAA >60 11/12/2017 0844   GFRAA 128 05/18/2017 0928    No results found for: TOTALPROTELP, ALBUMINELP, A1GS, A2GS, BETS, BETA2SER, GAMS, MSPIKE, SPEI  No results found for: KPAFRELGTCHN, LAMBDASER, [facility]  Lab Results  Component Value Date   WBC 4.5 11/12/2017   NEUTROABS 2.7 11/12/2017   HGB 13.7 09/01/2017   HCT 37.6 11/12/2017   MCV 99.5 11/12/2017   PLT 210 11/12/2017      Chemistry      Component Value Date/Time   NA 143 11/12/2017 0844   NA 138 09/01/2017 1238    K 3.7 11/12/2017 0844   K 4.4 09/01/2017 1238   CL 107 11/12/2017 0844  CO2 26 11/12/2017 0844   CO2 25 09/01/2017 1238   BUN 12 11/12/2017 0844   BUN 9.2 09/01/2017 1238   CREATININE 0.67 11/12/2017 0844   CREATININE 0.7 09/01/2017 1238      Component Value Date/Time   CALCIUM 9.6 11/12/2017 0844   CALCIUM 9.4 09/01/2017 1238   ALKPHOS 50 11/12/2017 0844   ALKPHOS 60 09/01/2017 1238   AST 19 11/12/2017 0844   AST 21 09/01/2017 1238   ALT 18 11/12/2017 0844   ALT 21 09/01/2017 1238   BILITOT 0.7 11/12/2017 0844   BILITOT 0.65 09/01/2017 1238       No results found for: LABCA2  No components found for: FBPZWC585  No results for input(s): INR in the last 168 hours.  No results found for: LABCA2  No results found for: IDP824  No results found for: MPN361  No results found for: WER154  No results found for: CA2729  No components found for: HGQUANT  No results found for: CEA1 / No results found for: CEA1   No results found for: AFPTUMOR  No results found for: CHROMOGRNA  No results found for: PSA1  No visits with results within 3 Day(s) from this visit.  Latest known visit with results is:  Appointment on 11/12/2017  Component Date Value Ref Range Status  . WBC Count 11/12/2017 4.5  3.9 - 10.3 K/uL Final  . RBC 11/12/2017 3.78  3.70 - 5.45 MIL/uL Final  . Hemoglobin 11/12/2017 12.6  11.6 - 15.9 g/dL Final  . HCT 11/12/2017 37.6  34.8 - 46.6 % Final  . MCV 11/12/2017 99.5  79.5 - 101.0 fL Final  . MCH 11/12/2017 33.3  25.1 - 34.0 pg Final  . MCHC 11/12/2017 33.5  31.5 - 36.0 g/dL Final  . RDW 11/12/2017 13.8  11.2 - 14.5 % Final  . Platelet Count 11/12/2017 210  145 - 400 K/uL Final  . Neutrophils Relative % 11/12/2017 60  % Final  . Neutro Abs 11/12/2017 2.7  1.5 - 6.5 K/uL Final  . Lymphocytes Relative 11/12/2017 23  % Final  . Lymphs Abs 11/12/2017 1.1  0.9 - 3.3 K/uL Final  . Monocytes Relative 11/12/2017 8  % Final  . Monocytes Absolute 11/12/2017  0.4  0.1 - 0.9 K/uL Final  . Eosinophils Relative 11/12/2017 9  % Final  . Eosinophils Absolute 11/12/2017 0.4  0.0 - 0.5 K/uL Final  . Basophils Relative 11/12/2017 0  % Final  . Basophils Absolute 11/12/2017 0.0  0.0 - 0.1 K/uL Final   Performed at [facility], [place] [address] [place], [place] [address]  . Sodium 11/12/2017 143  136 - 145 mmol/L Final  . Potassium 11/12/2017 3.7  3.5 - 5.1 mmol/L Final  . Chloride 11/12/2017 107  98 - 109 mmol/L Final  . CO2 11/12/2017 26  22 - 29 mmol/L Final  . Glucose, Bld 11/12/2017 90  70 - 140 mg/dL Final  . BUN 11/12/2017 12  7 - 26 mg/dL Final  . Creatinine 11/12/2017 0.67  0.60 - 1.10 mg/dL Final  . Calcium 11/12/2017 9.6  8.4 - 10.4 mg/dL Final  . Total Protein 11/12/2017 6.7  6.4 - 8.3 g/dL Final  . Albumin 11/12/2017 4.1  3.5 - 5.0 g/dL Final  . AST 11/12/2017 19  5 - 34 U/L Final  . ALT 11/12/2017 18  0 - 55 U/L Final  . Alkaline Phosphatase 11/12/2017 50  40 - 150 U/L Final  . Total Bilirubin  11/12/2017 0.7  0.2 - 1.2 mg/dL Final  . GFR, Est Non Af Am 11/12/2017 >60  >60 mL/min Final  . GFR, Est AFR Am 11/12/2017 >60  >60 mL/min Final   Comment: (NOTE) The eGFR has been calculated using the CKD EPI equation. This calculation has not been validated in all clinical situations. eGFR's persistently <60 mL/min signify possible Chronic Kidney Disease.   Georgiann Hahn gap 11/12/2017 10  3 - 11 Final   Performed at [facility], [place] [address] [place], [place] [address]    (this displays the last labs from the last 3 days)  No results found for: TOTALPROTELP, ALBUMINELP, A1GS, A2GS, BETS, BETA2SER, GAMS, MSPIKE, SPEI (this displays SPEP labs)  No results found for: KPAFRELGTCHN, LAMBDASER, KAPLAMBRATIO (kappa/lambda light chains)  No results found for: HGBA, HGBA2QUANT, HGBFQUANT, HGBSQUAN (Hemoglobinopathy evaluation)   No results found for: LDH  Lab Results  Component Value Date    IRON 128 12/16/2016   TIBC 355 12/16/2016   IRONPCTSAT 36 12/16/2016   (Iron and TIBC)  Lab Results  Component Value Date   FERRITIN 67 12/16/2016    Urinalysis    Component Value Date/Time   COLORURINE YELLOW 06/16/2017 [place] 06/16/2017 1442   LABSPEC 1.020 06/16/2017 1442   PHURINE 6.5 06/16/2017 1442   GLUCOSEU NEGATIVE 06/16/2017 1442   HGBUR 2+ (A) 06/16/2017 1442   BILIRUBINUR NEGATIVE 05/18/2017 0928   KETONESUR NEGATIVE 06/16/2017 1442   PROTEINUR NEGATIVE 06/16/2017 1442   UROBILINOGEN 0.2 12/06/2014 1529   NITRITE NEGATIVE 06/16/2017 1442   LEUKOCYTESUR NEGATIVE 06/16/2017 1442    STUDIES: No results found.   ELIGIBLE FOR AVAILABLE RESEARCH PROTOCOL: No  ASSESSMENT: 47 y.o. [place] woman status post right breast upper inner quadrant biopsy 08/24/2017 for a clinical T1c N0 invasive ductal carcinoma, triple positive, with an MIB-1 of 60%  (1) genetics testing 09/10/2017 through the Common Hereditary Cancer Panel + Melanoma Panel found no deleterious mutations in: APC, ATM, AXIN2, BAP1, BARD1, BMPR1A, BRCA1, BRCA2, BRIP1, CDH1, CDK4, CDKN2A (p14ARF), CDKN2A (p16INK4a), CHEK2, CTNNA1, DICER1, EPCAM*, GREM1*, KIT, MEN1, MLH1, MSH2, MSH3, MSH6, MUTYH, NBN, NF1, PALB2, PDGFRA, PMS2, POLD1, POLE, POT1, PTEN, RAD50, RAD51C, RAD51D, RB1, SDHB, SDHC, SDHD, SMAD4, SMARCA4, STK11, TP53, TSC1, TSC2, VHL. The following genes were evaluated for sequence changes only: HOXB13*, MITF*, NTHL1*, SDHA  (2) status post right lumpectomy and sentinel axillary lymph node sampling 10/07/2017 at [facility] for a pT1C pN0, stage IA invasive ductal carcinoma, grade 3, with negative margins  (a) 1 sentinel lymph node removed, bilateral oncoplastic breast reduction  (3) adjuvant chemotherapy consisting of paclitaxel weekly x12 and trastuzumab every 21 days STARTING 11/12/2017  (4) continue trastuzumab to complete a year  (a) baseline echocardiogram 09/10/2017 shows an  ejection fraction of 55-60%  (5) adjuvant radiation to follow my therapy  (6) adjuvant antiestrogens to follow at the completion of local treatment  (7) goserelin for fertility preservation started 09/29/2017  PLAN:  Hannah Woodard is doing well today.  She tolerated her first dose of Paclitaxel and Trastuzumab well.  She will proceed with her second week of Paclitaxel today.  She is taking a long list of vitamins and supplements.  I reviewed her medications with Jenna in pharmacy who will go and speak to her today about these vitamins and supplements and whether or not they interact or effect her treatment.  Eliezer Lofts will continue to perform her cryotherapy while receiving chemotherapy by dipping her fingers and toes in  ice.  She denies any questions at this time.    Hannah Woodard will return weekly for Paclitaxel.  She will see myself or Dr. Jana Hakim with every other Paclitaxel treatment, and continue to receive Trastuzumab every 3 weeks.  She will also continue Goserelin every 4 weeks.    Hannah Woodard knows to call for any questions or concerns prior to her next appointment with [place].    A total of (30) minutes of face-to-face time was spent with this patient with greater than 50% of that time in counseling and care-coordination.  Wilber Bihari, NP  11/19/17 9:15 AM Medical Oncology and Hematology [facility] [address] [place], [place] [address] Tel. [phone]    Fax. [phone]

## 2017-11-22 ENCOUNTER — Telehealth: Payer: Self-pay | Admitting: Adult Health

## 2017-11-22 NOTE — Telephone Encounter (Signed)
Per 2/22 no los

## 2017-11-23 ENCOUNTER — Ambulatory Visit: Payer: BC Managed Care – PPO

## 2017-11-23 ENCOUNTER — Other Ambulatory Visit: Payer: BC Managed Care – PPO

## 2017-11-24 ENCOUNTER — Telehealth: Payer: Self-pay

## 2017-11-24 NOTE — Telephone Encounter (Signed)
Received VM from pt regarding nasal stuffiness and dryness of left nostril with a tinge of blood on tissue.  Returned pt's call and discussed trying over the counter saline nasal spray, aquaphor on inside nares for dryness, and plain Sudafed.  Pt denies fever.  Discussed drying of mucus membranes when receiving chemo treatment.  Pt will call back if worsening of symptoms ie fever or actual nose bleeds. Pt has upcoming appt on 3/1 for infusion and 3/8 with Dr Jana Hakim- encouraged pt to call if needs to see Dr Jana Hakim sooner.  Pt voiced understanding.

## 2017-11-25 ENCOUNTER — Other Ambulatory Visit: Payer: Self-pay | Admitting: *Deleted

## 2017-11-25 DIAGNOSIS — C50211 Malignant neoplasm of upper-inner quadrant of right female breast: Secondary | ICD-10-CM

## 2017-11-25 DIAGNOSIS — Z170 Estrogen receptor positive status [ER+]: Principal | ICD-10-CM

## 2017-11-26 ENCOUNTER — Inpatient Hospital Stay: Payer: BC Managed Care – PPO

## 2017-11-26 ENCOUNTER — Inpatient Hospital Stay: Payer: BC Managed Care – PPO | Attending: Oncology

## 2017-11-26 VITALS — BP 120/68 | HR 74 | Temp 98.4°F | Resp 16

## 2017-11-26 DIAGNOSIS — F418 Other specified anxiety disorders: Secondary | ICD-10-CM | POA: Diagnosis not present

## 2017-11-26 DIAGNOSIS — Z8041 Family history of malignant neoplasm of ovary: Secondary | ICD-10-CM | POA: Insufficient documentation

## 2017-11-26 DIAGNOSIS — C50211 Malignant neoplasm of upper-inner quadrant of right female breast: Secondary | ICD-10-CM | POA: Diagnosis not present

## 2017-11-26 DIAGNOSIS — Z95828 Presence of other vascular implants and grafts: Secondary | ICD-10-CM

## 2017-11-26 DIAGNOSIS — Z5112 Encounter for antineoplastic immunotherapy: Secondary | ICD-10-CM | POA: Diagnosis not present

## 2017-11-26 DIAGNOSIS — Z170 Estrogen receptor positive status [ER+]: Secondary | ICD-10-CM | POA: Insufficient documentation

## 2017-11-26 DIAGNOSIS — Z808 Family history of malignant neoplasm of other organs or systems: Secondary | ICD-10-CM | POA: Diagnosis not present

## 2017-11-26 DIAGNOSIS — Z3184 Encounter for fertility preservation procedure: Secondary | ICD-10-CM | POA: Insufficient documentation

## 2017-11-26 DIAGNOSIS — Z79899 Other long term (current) drug therapy: Secondary | ICD-10-CM | POA: Diagnosis not present

## 2017-11-26 DIAGNOSIS — Z5111 Encounter for antineoplastic chemotherapy: Secondary | ICD-10-CM | POA: Diagnosis not present

## 2017-11-26 DIAGNOSIS — Z8042 Family history of malignant neoplasm of prostate: Secondary | ICD-10-CM | POA: Insufficient documentation

## 2017-11-26 DIAGNOSIS — R5383 Other fatigue: Secondary | ICD-10-CM | POA: Diagnosis not present

## 2017-11-26 DIAGNOSIS — Z79818 Long term (current) use of other agents affecting estrogen receptors and estrogen levels: Secondary | ICD-10-CM | POA: Insufficient documentation

## 2017-11-26 DIAGNOSIS — M2550 Pain in unspecified joint: Secondary | ICD-10-CM | POA: Diagnosis not present

## 2017-11-26 DIAGNOSIS — R21 Rash and other nonspecific skin eruption: Secondary | ICD-10-CM | POA: Insufficient documentation

## 2017-11-26 LAB — CBC WITH DIFFERENTIAL (CANCER CENTER ONLY)
BASOS ABS: 0 10*3/uL (ref 0.0–0.1)
Basophils Relative: 1 %
Eosinophils Absolute: 0.1 10*3/uL (ref 0.0–0.5)
Eosinophils Relative: 4 %
HCT: 34 % — ABNORMAL LOW (ref 34.8–46.6)
HEMOGLOBIN: 11.6 g/dL (ref 11.6–15.9)
LYMPHS ABS: 1.2 10*3/uL (ref 0.9–3.3)
LYMPHS PCT: 29 %
MCH: 33.5 pg (ref 25.1–34.0)
MCHC: 34.1 g/dL (ref 31.5–36.0)
MCV: 98.4 fL (ref 79.5–101.0)
Monocytes Absolute: 0.2 10*3/uL (ref 0.1–0.9)
Monocytes Relative: 6 %
NEUTROS PCT: 60 %
Neutro Abs: 2.4 10*3/uL (ref 1.5–6.5)
Platelet Count: 291 10*3/uL (ref 145–400)
RBC: 3.46 MIL/uL — AB (ref 3.70–5.45)
RDW: 13.7 % (ref 11.2–14.5)
WBC: 4 10*3/uL (ref 3.9–10.3)

## 2017-11-26 LAB — CMP (CANCER CENTER ONLY)
ALT: 35 U/L (ref 0–55)
ANION GAP: 10 (ref 3–11)
AST: 20 U/L (ref 5–34)
Albumin: 4.1 g/dL (ref 3.5–5.0)
Alkaline Phosphatase: 51 U/L (ref 40–150)
BUN: 13 mg/dL (ref 7–26)
CO2: 27 mmol/L (ref 22–29)
Calcium: 9.7 mg/dL (ref 8.4–10.4)
Chloride: 104 mmol/L (ref 98–109)
Creatinine: 0.65 mg/dL (ref 0.60–1.10)
Glucose, Bld: 113 mg/dL (ref 70–140)
POTASSIUM: 4 mmol/L (ref 3.5–5.1)
Sodium: 141 mmol/L (ref 136–145)
Total Bilirubin: 0.8 mg/dL (ref 0.2–1.2)
Total Protein: 6.8 g/dL (ref 6.4–8.3)

## 2017-11-26 MED ORDER — DEXAMETHASONE SODIUM PHOSPHATE 10 MG/ML IJ SOLN
INTRAMUSCULAR | Status: AC
  Filled 2017-11-26: qty 1

## 2017-11-26 MED ORDER — DEXAMETHASONE SODIUM PHOSPHATE 10 MG/ML IJ SOLN
10.0000 mg | Freq: Once | INTRAMUSCULAR | Status: AC
  Administered 2017-11-26: 10 mg via INTRAVENOUS

## 2017-11-26 MED ORDER — SODIUM CHLORIDE 0.9 % IV SOLN
Freq: Once | INTRAVENOUS | Status: AC
  Administered 2017-11-26: 11:00:00 via INTRAVENOUS

## 2017-11-26 MED ORDER — GOSERELIN ACETATE 3.6 MG ~~LOC~~ IMPL
DRUG_IMPLANT | SUBCUTANEOUS | Status: AC
  Filled 2017-11-26: qty 3.6

## 2017-11-26 MED ORDER — SODIUM CHLORIDE 0.9% FLUSH
10.0000 mL | INTRAVENOUS | Status: DC | PRN
  Administered 2017-11-26: 10 mL
  Filled 2017-11-26: qty 10

## 2017-11-26 MED ORDER — FAMOTIDINE IN NACL 20-0.9 MG/50ML-% IV SOLN: 20.0000 mg | Freq: Once | INTRAVENOUS | Status: DC

## 2017-11-26 MED ORDER — SODIUM CHLORIDE 0.9 % IV SOLN
80.0000 mg/m2 | Freq: Once | INTRAVENOUS | Status: AC
  Administered 2017-11-26: 150 mg via INTRAVENOUS
  Filled 2017-11-26: qty 25

## 2017-11-26 MED ORDER — DIPHENHYDRAMINE HCL 50 MG/ML IJ SOLN
25.0000 mg | Freq: Once | INTRAMUSCULAR | Status: AC
  Administered 2017-11-26: 25 mg via INTRAVENOUS

## 2017-11-26 MED ORDER — SODIUM CHLORIDE 0.9 % IV SOLN
20.0000 mg | Freq: Once | INTRAVENOUS | Status: AC
  Administered 2017-11-26: 20 mg via INTRAVENOUS
  Filled 2017-11-26: qty 2

## 2017-11-26 MED ORDER — DIPHENHYDRAMINE HCL 50 MG/ML IJ SOLN
INTRAMUSCULAR | Status: AC
  Filled 2017-11-26: qty 1

## 2017-11-26 MED ORDER — SODIUM CHLORIDE 0.9% FLUSH
10.0000 mL | Freq: Once | INTRAVENOUS | Status: AC
  Administered 2017-11-26: 10 mL
  Filled 2017-11-26: qty 10

## 2017-11-26 MED ORDER — GOSERELIN ACETATE 3.6 MG ~~LOC~~ IMPL
3.6000 mg | DRUG_IMPLANT | Freq: Once | SUBCUTANEOUS | Status: AC
  Administered 2017-11-26: 3.6 mg via SUBCUTANEOUS

## 2017-11-26 MED ORDER — HEPARIN SOD (PORK) LOCK FLUSH 100 UNIT/ML IV SOLN
500.0000 [IU] | Freq: Once | INTRAVENOUS | Status: AC | PRN
  Administered 2017-11-26: 500 [IU]
  Filled 2017-11-26: qty 5

## 2017-11-26 NOTE — Patient Instructions (Signed)
[facility] Discharge Instructions for Patients Receiving Chemotherapy  Today you received the following chemotherapy agents:  Taxol.  To help prevent nausea and vomiting after your treatment, we encourage you to take your nausea medication as directed.   If you develop nausea and vomiting that is not controlled by your nausea medication, call the clinic.   BELOW ARE SYMPTOMS THAT SHOULD BE REPORTED IMMEDIATELY:  *FEVER GREATER THAN 100.5 F  *CHILLS WITH OR WITHOUT FEVER  NAUSEA AND VOMITING THAT IS NOT CONTROLLED WITH YOUR NAUSEA MEDICATION  *UNUSUAL SHORTNESS OF BREATH  *UNUSUAL BRUISING OR BLEEDING  TENDERNESS IN MOUTH AND THROAT WITH OR WITHOUT PRESENCE OF ULCERS  *URINARY PROBLEMS  *BOWEL PROBLEMS  UNUSUAL RASH Items with * indicate a potential emergency and should be followed up as soon as possible.  Feel free to call the clinic should you have any questions or concerns. The clinic phone number is [phone].  Please show the [facility] at check-in to the Emergency Department and triage nurse.

## 2017-11-30 ENCOUNTER — Ambulatory Visit: Payer: BC Managed Care – PPO

## 2017-11-30 ENCOUNTER — Other Ambulatory Visit: Payer: BC Managed Care – PPO

## 2017-12-02 ENCOUNTER — Other Ambulatory Visit: Payer: Self-pay | Admitting: *Deleted

## 2017-12-02 DIAGNOSIS — Z170 Estrogen receptor positive status [ER+]: Principal | ICD-10-CM

## 2017-12-02 DIAGNOSIS — C50211 Malignant neoplasm of upper-inner quadrant of right female breast: Secondary | ICD-10-CM

## 2017-12-02 NOTE — Progress Notes (Signed)
[name]  [phone] [phone]) [phone]     ID: [name] DOB: 1975/12/11  MR#: [id number]  CWC#:[id number]  Patient Care Team: Unk Pinto, MD as PCP - General (Internal Medicine) Jovita Kussmaul, MD as Consulting Physician (General Surgery) Magrinat, Virgie Dad, MD as Consulting Physician (Oncology) Kyung Rudd, MD as Consulting Physician (Radiation Oncology) Lorelle Gibbs, MD (Radiology) Howard-McNatt, Mable Fill, MD as Referring Physician (Surgery) OTHER MD:  CHIEF COMPLAINT: Triple positive breast cancer  CURRENT TREATMENT: Adjuvant chemotherapy  INTERVAL HISTORY: Hannah Woodard returns today for follow-up and treatment of her triple positive breast cancer. She receives paclitaxel every 7 days x12 , with today being day 1 cycle 4. She denies peripheral neuropathy and oral sores. She felt some fatigue.  She receives trastuzumab every 21 days. She tolerates this well.   She receives goserelin injections every 28 days, with her last dose on 11/26/2017. She also tolerates this well.   REVIEW OF SYSTEMS: Hannah Woodard reports that she has some congestion. She says that her husband is also sick. She notes that she felt very fatigued after helping her friend move. She has some tooth pain and a small mouth sore, but this is because she bit her lip. She notes that she has been working out with her Physiological scientist at least once per month. She notes that she is is trying to wear comfortable bras, but her current bra caused discomfort. She denies unusual headaches, visual changes, nausea, vomiting, or dizziness. There has been no unusual cough, phlegm production, or pleurisy. This been no change in bowel or bladder habits. She denies unexplained fatigue or unexplained weight loss, bleeding, rash, or fever. A detailed review of systems was otherwise stable.    HISTORY OF CURRENT ILLNESS: From the original intake note:  The patient has a history of cysts and  nodules in the right breast which have been closely followed, with exams 07/30/2016 and 01/28/2017.  On 07/29/2017 follow-up right breast ultrasonography showed no findings of concern.  Bilateral diagnostic mammography 08/18/2017 with right breast ultrasonography on 08/18/2017 at [facility] found the breast density to be category C.  There was now a new irregular high density mass in the posterior portion of the right breast seen on the mediolateral oblique view only.  Ultrasonography confirmed a 1.4 cm lobulated mass in the upper inner quadrant of the right breast 10 cm from the nipple associated with pectoral muscle invasion.  The right axilla was sonographically benign.  Biopsy of this mass 08/24/2017 showed ([id number]) invasive ductal carcinoma, grade 2, estrogen receptor 95% positive, progesterone receptor 100% positive, both with strong staining intensity, with an MIB-1 of 60%, and HER-2 amplified, with a signals ratio of 2.84 (full report pending).  The patient's subsequent history is as detailed below.  PAST MEDICAL HISTORY: Past Medical History:  Diagnosis Date  . Breast cancer ([place])   . Family history of ovarian cancer 09/02/2017  . Family history of prostate cancer in father 09/02/2017  . Family history of uterine cancer 09/02/2017  . Plantar fasciitis     PAST SURGICAL HISTORY: No past surgical history on file.  FAMILY HISTORY Family History  Problem Relation Age of Onset  . Ovarian cancer Mother 68  . Prostate cancer Father 59       'high gleason' unsure number  . Ulcerative colitis Sister   . Other Sister        abn ovaian growth, partial hysterectomy  . Uterine cancer Maternal Aunt 81  . Stroke  Maternal Aunt 66  . Basal cell carcinoma Brother   . Skin cancer Paternal Uncle   . Skin cancer Maternal Grandmother   . Stroke Paternal Grandfather 69  . Hydrocephalus Cousin   The patient's father died from alcoholic cirrhosis at age 1.  He had been diagnosed with prostate  cancer at age 33.  The patient's mother was diagnosed with ovarian cancer at age 108 and died a year later.  The patient has 1 brother, 1 sister.  The patient's brother has had basal cell and other skin cancers.  A paternal aunt had uterine cancer.  Other relatives have had skin cancers but the patient does not know if these were melanomas or not  GYNECOLOGIC HISTORY:  No LMP recorded.  Menarche age 13, the patient has never been pregnant.  She is still having regular periods.  She used oral contraceptives briefly in the past without complications. She and her husband are very interested in having children, and currently (December 2018) they are not using any contraception.   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  Her husband Hannah Woodard (goes by [employer]") is a Dealer.  At home it's just them, a [employer] and 2 cats.    ADVANCED DIRECTIVES: Not in place   HEALTH MAINTENANCE: Social History   Tobacco Use  . Smoking status: Never Smoker  . Smokeless tobacco: Never Used  Substance Use Topics  . Alcohol use: Yes  . Drug use: No     Colonoscopy: Never  PAP:  Bone density: Never   Allergies  Allergen Reactions  . Milk-Related Compounds Diarrhea    Stomach cramping  . Poison Ivy Extract [Poison Ivy Extract] Hives, Itching and Rash    Current Outpatient Medications  Medication Sig Dispense Refill  . b complex vitamins tablet Take 1 tablet by mouth daily.    . Cholecalciferol (VITAMIN D) 2000 UNITS tablet Take 10,000 Units by mouth daily.     . Cholecalciferol (VITAMIN D) 2000 units tablet Take by mouth.    . ECHINACEA PO Take by mouth.    . Ferrous Sulfate (IRON) 325 (65 Fe) MG TABS Take by mouth daily.    . fish oil-omega-3 fatty acids 1000 MG capsule Take 1 g by mouth daily.    Nyoka Cowden Tea 250 MG CAPS Take by mouth 2 (two) times daily.    . Iodine Strong, Lugols, (STRONG IODINE) 5 % solution Take 0.2  mLs by mouth 3 (three) times daily. 480 mL 0  . lidocaine-prilocaine (EMLA) cream Apply to affected area once (Patient not taking: Reported on 10/27/2017) 30 g 3  . ondansetron (ZOFRAN ODT) 8 MG disintegrating tablet 53m ODT q6 hours prn nausea 30 tablet 0  . ondansetron (ZOFRAN) 8 MG tablet Take 1 tablet (8 mg total) by mouth 2 (two) times daily as needed (Nausea or vomiting). (Patient not taking: Reported on 10/27/2017) 30 tablet 1  . prochlorperazine (COMPAZINE) 10 MG tablet Take 1 tablet (10 mg total) by mouth every 6 (six) hours as needed (Nausea or vomiting). (Patient not taking: Reported on 10/27/2017) 30 tablet 1  . TURMERIC PO Take by mouth.    .Marland KitchenUNABLE TO FIND TKuwaitTail mushroom 500 mg    . UNABLE TO FIND [place] Mushroom 500 mg daily    . UNABLE TO FIND CBD Oil; patient takes for nausea    . vitamin C (ASCORBIC ACID) 500 MG tablet Take 500 mg by mouth  2 (two) times daily.    . Zinc Acetate 50 MG CAPS Take by mouth.     No current facility-administered medications for this visit.     OBJECTIVE: Young white woman in no acute distress  Vitals:   12/03/17 0903  BP: 122/74  Pulse: 77  Resp: 18  Temp: 98.3 F (36.8 C)  SpO2: 100%     Body mass index is 23.95 kg/m.   Wt Readings from Last 3 Encounters:  12/03/17 148 lb 6.4 oz (67.3 kg)  11/19/17 152 lb 4.8 oz (69.1 kg)  11/12/17 151 lb 12.8 oz (68.9 kg)  ECOG FS:1 - Symptomatic but completely ambulatory   No cervical or supraclavicular adenopathy Lungs no rales or rhonchi Heart regular rate and rhythm Abd soft, nontender, positive bowel sounds MSK no focal spinal tenderness, no upper extremity lymphedema Neuro: nonfocal, well oriented, appropriate affect Breasts: No masses palpated in either breast.  Both axillae are benign   LAB RESULTS:  CMP     Component Value Date/Time   NA 141 11/26/2017 0951   NA 138 09/01/2017 1238   K 4.0 11/26/2017 0951   K 4.4 09/01/2017 1238   CL 104 11/26/2017 0951   CO2 27  11/26/2017 0951   CO2 25 09/01/2017 1238   GLUCOSE 113 11/26/2017 0951   GLUCOSE 83 09/01/2017 1238   BUN 13 11/26/2017 0951   BUN 9.2 09/01/2017 1238   CREATININE 0.65 11/26/2017 0951   CREATININE 0.7 09/01/2017 1238   CALCIUM 9.7 11/26/2017 0951   CALCIUM 9.4 09/01/2017 1238   PROT 6.8 11/26/2017 0951   PROT 7.4 09/01/2017 1238   ALBUMIN 4.1 11/26/2017 0951   ALBUMIN 4.2 09/01/2017 1238   AST 20 11/26/2017 0951   AST 21 09/01/2017 1238   ALT 35 11/26/2017 0951   ALT 21 09/01/2017 1238   ALKPHOS 51 11/26/2017 0951   ALKPHOS 60 09/01/2017 1238   BILITOT 0.8 11/26/2017 0951   BILITOT 0.65 09/01/2017 1238   GFRNONAA >60 11/26/2017 0951   GFRNONAA 111 05/18/2017 0928   GFRAA >60 11/26/2017 0951   GFRAA 128 05/18/2017 0928    No results found for: Ronnald Ramp, A1GS, A2GS, BETS, BETA2SER, GAMS, MSPIKE, SPEI  No results found for: Nils Pyle, [facility]  Lab Results  Component Value Date   WBC 4.0 11/26/2017   NEUTROABS 2.4 11/26/2017   HGB 13.7 09/01/2017   HCT 34.0 (L) 11/26/2017   MCV 98.4 11/26/2017   PLT 291 11/26/2017      Chemistry      Component Value Date/Time   NA 141 11/26/2017 0951   NA 138 09/01/2017 1238   K 4.0 11/26/2017 0951   K 4.4 09/01/2017 1238   CL 104 11/26/2017 0951   CO2 27 11/26/2017 0951   CO2 25 09/01/2017 1238   BUN 13 11/26/2017 0951   BUN 9.2 09/01/2017 1238   CREATININE 0.65 11/26/2017 0951   CREATININE 0.7 09/01/2017 1238      Component Value Date/Time   CALCIUM 9.7 11/26/2017 0951   CALCIUM 9.4 09/01/2017 1238   ALKPHOS 51 11/26/2017 0951   ALKPHOS 60 09/01/2017 1238   AST 20 11/26/2017 0951   AST 21 09/01/2017 1238   ALT 35 11/26/2017 0951   ALT 21 09/01/2017 1238   BILITOT 0.8 11/26/2017 0951   BILITOT 0.65 09/01/2017 1238       No results found for: LABCA2  No components found for: TGYBWL893  No results for input(s): INR in the last 168  hours.  No results found for: LABCA2  No  results found for: JHE174  No results found for: YCX448  No results found for: JEH631  No results found for: CA2729  No components found for: HGQUANT  No results found for: CEA1 / No results found for: CEA1   No results found for: AFPTUMOR  No results found for: CHROMOGRNA  No results found for: PSA1  No visits with results within 3 Day(s) from this visit.  Latest known visit with results is:  Appointment on 11/26/2017  Component Date Value Ref Range Status  . Sodium 11/26/2017 141  136 - 145 mmol/L Final  . Potassium 11/26/2017 4.0  3.5 - 5.1 mmol/L Final  . Chloride 11/26/2017 104  98 - 109 mmol/L Final  . CO2 11/26/2017 27  22 - 29 mmol/L Final  . Glucose, Bld 11/26/2017 113  70 - 140 mg/dL Final  . BUN 11/26/2017 13  7 - 26 mg/dL Final  . Creatinine 11/26/2017 0.65  0.60 - 1.10 mg/dL Final  . Calcium 11/26/2017 9.7  8.4 - 10.4 mg/dL Final  . Total Protein 11/26/2017 6.8  6.4 - 8.3 g/dL Final  . Albumin 11/26/2017 4.1  3.5 - 5.0 g/dL Final  . AST 11/26/2017 20  5 - 34 U/L Final  . ALT 11/26/2017 35  0 - 55 U/L Final  . Alkaline Phosphatase 11/26/2017 51  40 - 150 U/L Final  . Total Bilirubin 11/26/2017 0.8  0.2 - 1.2 mg/dL Final  . GFR, Est Non Af Am 11/26/2017 >60  >60 mL/min Final  . GFR, Est AFR Am 11/26/2017 >60  >60 mL/min Final   Comment: (NOTE) The eGFR has been calculated using the CKD EPI equation. This calculation has not been validated in all clinical situations. eGFR's persistently <60 mL/min signify possible Chronic Kidney Disease.   Georgiann Hahn gap 11/26/2017 10  3 - 11 Final   Performed at [facility], [place] [address], [place], [place] [address]  . WBC Count 11/26/2017 4.0  3.9 - 10.3 K/uL Final  . RBC 11/26/2017 3.46* 3.70 - 5.45 MIL/uL Final  . Hemoglobin 11/26/2017 11.6  11.6 - 15.9 g/dL Final  . HCT 11/26/2017 34.0* 34.8 - 46.6 % Final  . MCV 11/26/2017 98.4  79.5 - 101.0 fL Final  . MCH 11/26/2017 33.5  25.1 - 34.0 pg  Final  . MCHC 11/26/2017 34.1  31.5 - 36.0 g/dL Final  . RDW 11/26/2017 13.7  11.2 - 14.5 % Final  . Platelet Count 11/26/2017 291  145 - 400 K/uL Final  . Neutrophils Relative % 11/26/2017 60  % Final  . Neutro Abs 11/26/2017 2.4  1.5 - 6.5 K/uL Final  . Lymphocytes Relative 11/26/2017 29  % Final  . Lymphs Abs 11/26/2017 1.2  0.9 - 3.3 K/uL Final  . Monocytes Relative 11/26/2017 6  % Final  . Monocytes Absolute 11/26/2017 0.2  0.1 - 0.9 K/uL Final  . Eosinophils Relative 11/26/2017 4  % Final  . Eosinophils Absolute 11/26/2017 0.1  0.0 - 0.5 K/uL Final  . Basophils Relative 11/26/2017 1  % Final  . Basophils Absolute 11/26/2017 0.0  0.0 - 0.1 K/uL Final   Performed at [facility], [place] [address] [place], [place] [address]    (this displays the last labs from the last 3 days)  No results found for: TOTALPROTELP, ALBUMINELP, A1GS, A2GS, BETS, BETA2SER, GAMS, MSPIKE, SPEI (this displays SPEP labs)  No results found for:  KPAFRELGTCHN, LAMBDASER, KAPLAMBRATIO (kappa/lambda light chains)  No results found for: HGBA, HGBA2QUANT, HGBFQUANT, HGBSQUAN (Hemoglobinopathy evaluation)   No results found for: LDH  Lab Results  Component Value Date   IRON 128 12/16/2016   TIBC 355 12/16/2016   IRONPCTSAT 36 12/16/2016   (Iron and TIBC)  Lab Results  Component Value Date   FERRITIN 67 12/16/2016    Urinalysis    Component Value Date/Time   COLORURINE YELLOW 06/16/2017 [place] 06/16/2017 1442   LABSPEC 1.020 06/16/2017 1442   PHURINE 6.5 06/16/2017 1442   GLUCOSEU NEGATIVE 06/16/2017 1442   HGBUR 2+ (A) 06/16/2017 1442   BILIRUBINUR NEGATIVE 05/18/2017 0928   KETONESUR NEGATIVE 06/16/2017 1442   PROTEINUR NEGATIVE 06/16/2017 1442   UROBILINOGEN 0.2 12/06/2014 1529   NITRITE NEGATIVE 06/16/2017 1442   LEUKOCYTESUR NEGATIVE 06/16/2017 1442    STUDIES: Is due for an echocardiogram later this month  ELIGIBLE FOR AVAILABLE RESEARCH  PROTOCOL: No  ASSESSMENT: 46 y.o. [place] woman status post right breast upper inner quadrant biopsy 08/24/2017 for a clinical T1c N0 invasive ductal carcinoma, triple positive, with an MIB-1 of 60%  (1) genetics testing 09/10/2017 through the Common Hereditary Cancer Panel + Melanoma Panel found no deleterious mutations in: APC, ATM, AXIN2, BAP1, BARD1, BMPR1A, BRCA1, BRCA2, BRIP1, CDH1, CDK4, CDKN2A (p14ARF), CDKN2A (p16INK4a), CHEK2, CTNNA1, DICER1, EPCAM*, GREM1*, KIT, MEN1, MLH1, MSH2, MSH3, MSH6, MUTYH, NBN, NF1, PALB2, PDGFRA, PMS2, POLD1, POLE, POT1, PTEN, RAD50, RAD51C, RAD51D, RB1, SDHB, SDHC, SDHD, SMAD4, SMARCA4, STK11, TP53, TSC1, TSC2, VHL. The following genes were evaluated for sequence changes only: HOXB13*, MITF*, NTHL1*, SDHA  (2) status post right lumpectomy and sentinel axillary lymph node sampling 10/07/2017 at [facility] for a pT1C pN0, stage IA invasive ductal carcinoma, grade 3, with negative margins  (a) 1 sentinel lymph node removed, bilateral oncoplastic breast reduction  (3) adjuvant chemotherapy consisting of paclitaxel weekly x12 and trastuzumab every 21 days STARTING 11/12/2017  (4) continue trastuzumab to complete a year  (a) baseline echocardiogram 09/10/2017 shows an ejection fraction of 55-60%  (5) adjuvant radiation to follow my therapy  (6) adjuvant antiestrogens to follow at the completion of local treatment  (7) goserelin for fertility preservation started 09/29/2017  PLAN:  Hannah Woodard is tolerating her treatment remarkably well.  She will receive her fourth of 12 planned weekly paclitaxel doses today.  She received her second dose of trastuzumab last week, with no complications.  She is also receiving goserelin every 4 weeks, for fertility preservation.  She has had more than 2 doses now so there is no concern regarding pregnancy.  She will return next week for Taxol alone and then 2 weeks from now for Taxol and trastuzumab.  She will see me at that  time.  She will need an echocardiogram sometime this month.  She does already have an appointment with Dr. Haroldine Laws regarding that  She knows to call for any other problems that may develop before the next visit here.     Magrinat, Virgie Dad, MD  12/03/17 9:44 AM Medical Oncology and Hematology [facility] [address] [place], [place] [address] Tel. [phone]    Fax. [phone]  This document serves as a record of services personally performed by Lurline Del, MD. It was created on his behalf by Sheron Nightingale, a trained medical scribe. The creation of this record is based on the scribe's personal observations and the provider's statements to them.   I have reviewed the above documentation  for accuracy and completeness, and I agree with the above.

## 2017-12-03 ENCOUNTER — Inpatient Hospital Stay: Payer: BC Managed Care – PPO

## 2017-12-03 ENCOUNTER — Telehealth: Payer: Self-pay | Admitting: Oncology

## 2017-12-03 ENCOUNTER — Encounter: Payer: Self-pay | Admitting: Oncology

## 2017-12-03 ENCOUNTER — Inpatient Hospital Stay (HOSPITAL_BASED_OUTPATIENT_CLINIC_OR_DEPARTMENT_OTHER): Payer: BC Managed Care – PPO | Admitting: Oncology

## 2017-12-03 VITALS — BP 122/74 | HR 77 | Temp 98.3°F | Resp 18 | Ht 66.0 in | Wt 148.4 lb

## 2017-12-03 DIAGNOSIS — Z170 Estrogen receptor positive status [ER+]: Principal | ICD-10-CM

## 2017-12-03 DIAGNOSIS — C50211 Malignant neoplasm of upper-inner quadrant of right female breast: Secondary | ICD-10-CM | POA: Diagnosis not present

## 2017-12-03 DIAGNOSIS — Z95828 Presence of other vascular implants and grafts: Secondary | ICD-10-CM

## 2017-12-03 LAB — CMP (CANCER CENTER ONLY)
ALT: 35 U/L (ref 0–55)
AST: 21 U/L (ref 5–34)
Albumin: 4.1 g/dL (ref 3.5–5.0)
Alkaline Phosphatase: 54 U/L (ref 40–150)
Anion gap: 9 (ref 3–11)
BUN: 11 mg/dL (ref 7–26)
CALCIUM: 9.8 mg/dL (ref 8.4–10.4)
CO2: 27 mmol/L (ref 22–29)
CREATININE: 0.65 mg/dL (ref 0.60–1.10)
Chloride: 106 mmol/L (ref 98–109)
Glucose, Bld: 105 mg/dL (ref 70–140)
Potassium: 3.9 mmol/L (ref 3.5–5.1)
Sodium: 142 mmol/L (ref 136–145)
Total Bilirubin: 0.6 mg/dL (ref 0.2–1.2)
Total Protein: 6.8 g/dL (ref 6.4–8.3)

## 2017-12-03 LAB — CBC WITH DIFFERENTIAL (CANCER CENTER ONLY)
BASOS ABS: 0 10*3/uL (ref 0.0–0.1)
BASOS PCT: 1 %
EOS ABS: 0.1 10*3/uL (ref 0.0–0.5)
Eosinophils Relative: 3 %
HCT: 34.5 % — ABNORMAL LOW (ref 34.8–46.6)
HEMOGLOBIN: 11.9 g/dL (ref 11.6–15.9)
Lymphocytes Relative: 29 %
Lymphs Abs: 1.1 10*3/uL (ref 0.9–3.3)
MCH: 33.9 pg (ref 25.1–34.0)
MCHC: 34.5 g/dL (ref 31.5–36.0)
MCV: 98.3 fL (ref 79.5–101.0)
Monocytes Absolute: 0.3 10*3/uL (ref 0.1–0.9)
Monocytes Relative: 8 %
NEUTROS PCT: 59 %
Neutro Abs: 2.4 10*3/uL (ref 1.5–6.5)
Platelet Count: 303 10*3/uL (ref 145–400)
RBC: 3.51 MIL/uL — ABNORMAL LOW (ref 3.70–5.45)
RDW: 14.2 % (ref 11.2–14.5)
WBC Count: 3.9 10*3/uL (ref 3.9–10.3)

## 2017-12-03 MED ORDER — ACETAMINOPHEN 325 MG PO TABS
650.0000 mg | ORAL_TABLET | Freq: Once | ORAL | Status: AC
  Administered 2017-12-03: 650 mg via ORAL

## 2017-12-03 MED ORDER — FAMOTIDINE IN NACL 20-0.9 MG/50ML-% IV SOLN: 20.0000 mg | Freq: Once | INTRAVENOUS | Status: DC

## 2017-12-03 MED ORDER — DIPHENHYDRAMINE HCL 50 MG/ML IJ SOLN
25.0000 mg | Freq: Once | INTRAMUSCULAR | Status: AC
  Administered 2017-12-03: 25 mg via INTRAVENOUS

## 2017-12-03 MED ORDER — SODIUM CHLORIDE 0.9 % IV SOLN
80.0000 mg/m2 | Freq: Once | INTRAVENOUS | Status: AC
  Administered 2017-12-03: 150 mg via INTRAVENOUS
  Filled 2017-12-03: qty 25

## 2017-12-03 MED ORDER — DEXAMETHASONE SODIUM PHOSPHATE 10 MG/ML IJ SOLN
10.0000 mg | Freq: Once | INTRAMUSCULAR | Status: AC
  Administered 2017-12-03: 10 mg via INTRAVENOUS

## 2017-12-03 MED ORDER — SODIUM CHLORIDE 0.9% FLUSH
10.0000 mL | Freq: Once | INTRAVENOUS | Status: AC
  Administered 2017-12-03: 10 mL
  Filled 2017-12-03: qty 10

## 2017-12-03 MED ORDER — HEPARIN SOD (PORK) LOCK FLUSH 100 UNIT/ML IV SOLN
500.0000 [IU] | Freq: Once | INTRAVENOUS | Status: AC | PRN
  Administered 2017-12-03: 500 [IU]
  Filled 2017-12-03: qty 5

## 2017-12-03 MED ORDER — DEXAMETHASONE SODIUM PHOSPHATE 10 MG/ML IJ SOLN
INTRAMUSCULAR | Status: AC
  Filled 2017-12-03: qty 1

## 2017-12-03 MED ORDER — DIPHENHYDRAMINE HCL 50 MG/ML IJ SOLN
INTRAMUSCULAR | Status: AC
  Filled 2017-12-03: qty 1

## 2017-12-03 MED ORDER — SODIUM CHLORIDE 0.9% FLUSH
10.0000 mL | INTRAVENOUS | Status: DC | PRN
  Administered 2017-12-03: 10 mL
  Filled 2017-12-03: qty 10

## 2017-12-03 MED ORDER — SODIUM CHLORIDE 0.9 % IV SOLN
20.0000 mg | Freq: Once | INTRAVENOUS | Status: AC
  Administered 2017-12-03: 20 mg via INTRAVENOUS
  Filled 2017-12-03: qty 2

## 2017-12-03 MED ORDER — SODIUM CHLORIDE 0.9 % IV SOLN
Freq: Once | INTRAVENOUS | Status: AC
  Administered 2017-12-03: 11:00:00 via INTRAVENOUS

## 2017-12-03 MED ORDER — ACETAMINOPHEN 325 MG PO TABS
ORAL_TABLET | ORAL | Status: AC
  Filled 2017-12-03: qty 2

## 2017-12-03 MED ORDER — DIPHENHYDRAMINE HCL 25 MG PO CAPS: 25.0000 mg | ORAL_CAPSULE | Freq: Once | ORAL | Status: DC

## 2017-12-03 MED ORDER — TRASTUZUMAB CHEMO 150 MG IV SOLR
6.0000 mg/kg | Freq: Once | INTRAVENOUS | Status: AC
  Administered 2017-12-03: 441 mg via INTRAVENOUS
  Filled 2017-12-03: qty 21

## 2017-12-03 NOTE — Telephone Encounter (Signed)
Gave avs and calendar ° °

## 2017-12-03 NOTE — Addendum Note (Signed)
Addended by: Chauncey Cruel on: 12/03/2017 11:04 AM   Modules accepted: Orders

## 2017-12-03 NOTE — Patient Instructions (Signed)
[facility] Discharge Instructions for Patients Receiving Chemotherapy  Today you received the following chemotherapy agents:  Taxol.  To help prevent nausea and vomiting after your treatment, we encourage you to take your nausea medication as directed.   If you develop nausea and vomiting that is not controlled by your nausea medication, call the clinic.   BELOW ARE SYMPTOMS THAT SHOULD BE REPORTED IMMEDIATELY:  *FEVER GREATER THAN 100.5 F  *CHILLS WITH OR WITHOUT FEVER  NAUSEA AND VOMITING THAT IS NOT CONTROLLED WITH YOUR NAUSEA MEDICATION  *UNUSUAL SHORTNESS OF BREATH  *UNUSUAL BRUISING OR BLEEDING  TENDERNESS IN MOUTH AND THROAT WITH OR WITHOUT PRESENCE OF ULCERS  *URINARY PROBLEMS  *BOWEL PROBLEMS  UNUSUAL RASH Items with * indicate a potential emergency and should be followed up as soon as possible.  Feel free to call the clinic should you have any questions or concerns. The clinic phone number is [phone].  Please show the [facility] at check-in to the Emergency Department and triage nurse.

## 2017-12-04 ENCOUNTER — Other Ambulatory Visit: Payer: Self-pay

## 2017-12-04 ENCOUNTER — Encounter (HOSPITAL_COMMUNITY): Payer: Self-pay | Admitting: Emergency Medicine

## 2017-12-04 ENCOUNTER — Emergency Department (HOSPITAL_COMMUNITY)
Admission: EM | Admit: 2017-12-04 | Discharge: 2017-12-04 | Disposition: A | Discharge status: Home / Self Care | Payer: BC Managed Care – PPO | Attending: Emergency Medicine | Admitting: Emergency Medicine

## 2017-12-04 DIAGNOSIS — R300 Dysuria: Secondary | ICD-10-CM | POA: Insufficient documentation

## 2017-12-04 DIAGNOSIS — R197 Diarrhea, unspecified: Secondary | ICD-10-CM | POA: Diagnosis present

## 2017-12-04 DIAGNOSIS — Z79899 Other long term (current) drug therapy: Secondary | ICD-10-CM | POA: Insufficient documentation

## 2017-12-04 DIAGNOSIS — Z853 Personal history of malignant neoplasm of breast: Secondary | ICD-10-CM | POA: Insufficient documentation

## 2017-12-04 DIAGNOSIS — R110 Nausea: Secondary | ICD-10-CM | POA: Insufficient documentation

## 2017-12-04 LAB — CBC
HEMATOCRIT: 33.9 % — AB (ref 36.0–46.0)
HEMOGLOBIN: 11.4 g/dL — AB (ref 12.0–15.0)
MCH: 33.3 pg (ref 26.0–34.0)
MCHC: 33.6 g/dL (ref 30.0–36.0)
MCV: 99.1 fL (ref 78.0–100.0)
Platelets: 292 10*3/uL (ref 150–400)
RBC: 3.42 MIL/uL — AB (ref 3.87–5.11)
RDW: 14.6 % (ref 11.5–15.5)
WBC: 4.4 10*3/uL (ref 4.0–10.5)

## 2017-12-04 LAB — COMPREHENSIVE METABOLIC PANEL
ALT: 42 U/L (ref 14–54)
AST: 29 U/L (ref 15–41)
Albumin: 4.1 g/dL (ref 3.5–5.0)
Alkaline Phosphatase: 45 U/L (ref 38–126)
Anion gap: 9 (ref 5–15)
BUN: 14 mg/dL (ref 6–20)
CHLORIDE: 104 mmol/L (ref 101–111)
CO2: 26 mmol/L (ref 22–32)
Calcium: 9.1 mg/dL (ref 8.9–10.3)
Creatinine, Ser: 0.52 mg/dL (ref 0.44–1.00)
GFR calc non Af Amer: >60 mL/min (ref >60)
Glucose, Bld: 109 mg/dL — ABNORMAL HIGH (ref 65–99)
POTASSIUM: 3.2 mmol/L — AB (ref 3.5–5.1)
SODIUM: 139 mmol/L (ref 135–145)
Total Bilirubin: 1 mg/dL (ref 0.3–1.2)
Total Protein: 6.4 g/dL — ABNORMAL LOW (ref 6.5–8.1)

## 2017-12-04 LAB — DIFFERENTIAL
BASOS PCT: 0 %
Basophils Absolute: 0 10*3/uL (ref 0.0–0.1)
Eosinophils Absolute: 0 10*3/uL (ref 0.0–0.7)
Eosinophils Relative: 1 %
Lymphocytes Relative: 24 %
Lymphs Abs: 1.1 10*3/uL (ref 0.7–4.0)
MONOS PCT: 5 %
Monocytes Absolute: 0.2 10*3/uL (ref 0.1–1.0)
Neutro Abs: 3.1 10*3/uL (ref 1.7–7.7)
Neutrophils Relative %: 70 %

## 2017-12-04 LAB — URINALYSIS, ROUTINE W REFLEX MICROSCOPIC
Bilirubin Urine: NEGATIVE
Glucose, UA: NEGATIVE mg/dL
HGB URINE DIPSTICK: NEGATIVE
Ketones, ur: NEGATIVE mg/dL
Leukocytes, UA: NEGATIVE
Nitrite: NEGATIVE
Protein, ur: NEGATIVE mg/dL
SPECIFIC GRAVITY, URINE: 1.016 (ref 1.005–1.030)
pH: 6 (ref 5.0–8.0)

## 2017-12-04 LAB — I-STAT BETA HCG BLOOD, ED (MC, WL, AP ONLY)

## 2017-12-04 LAB — LIPASE, BLOOD: LIPASE: 34 U/L (ref 11–51)

## 2017-12-04 MED ORDER — HEPARIN SOD (PORK) LOCK FLUSH 100 UNIT/ML IV SOLN
500.0000 [IU] | Freq: Once | INTRAVENOUS | Status: AC
  Administered 2017-12-04: 500 [IU]
  Filled 2017-12-04: qty 5

## 2017-12-04 MED ORDER — POTASSIUM CHLORIDE CRYS ER 20 MEQ PO TBCR
40.0000 meq | EXTENDED_RELEASE_TABLET | Freq: Once | ORAL | Status: AC
  Administered 2017-12-04: 40 meq via ORAL
  Filled 2017-12-04: qty 2

## 2017-12-04 MED ORDER — GI COCKTAIL ~~LOC~~
30.0000 mL | Freq: Once | ORAL | Status: AC
Start: 2017-12-04 — End: 2017-12-04
  Administered 2017-12-04: 30 mL via ORAL
  Filled 2017-12-04: qty 30

## 2017-12-04 MED ORDER — SODIUM CHLORIDE 0.9 % IV BOLUS (SEPSIS): 500.0000 mL | Freq: Once | INTRAVENOUS | Status: DC

## 2017-12-04 MED ORDER — SODIUM CHLORIDE 0.9 % IV BOLUS (SEPSIS)
1000.0000 mL | Freq: Once | INTRAVENOUS | Status: AC
Start: 2017-12-04 — End: 2017-12-04
  Administered 2017-12-04: 1000 mL via INTRAVENOUS

## 2017-12-04 MED ORDER — ONDANSETRON HCL 4 MG/2ML IJ SOLN: 4.0000 mg | Freq: Once | INTRAMUSCULAR | Status: DC

## 2017-12-04 MED ORDER — ONDANSETRON 4 MG PO TBDP: 4.0000 mg | ORAL_TABLET | Freq: Once | ORAL | Status: DC | PRN

## 2017-12-04 NOTE — Discharge Instructions (Signed)
Please read attached information regarding your condition. Continue your home medications as previously prescribed. Follow-up with your primary care provider for further evaluation. Return to ED for worsening symptoms, severe abdominal pain or chest pain, or fevers.

## 2017-12-04 NOTE — ED Notes (Signed)
UNSUCCESSFUL LAB COLLECTION ATTEMPT IN LEFT [facility]

## 2017-12-04 NOTE — ED Provider Notes (Signed)
[place] DEPT Provider Note   CSN: [id number] Arrival date & time: 12/04/17  1430     History   Chief Complaint Chief Complaint  Patient presents with  . Diarrhea    HPI Hannah Woodard is a 47 y.o. 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  Also reports feelings of heartburn and increased belching, intermittent dysuria.  She has taken 8 tablets of Imodium with no improvement in her diarrhea and has been taking Compazine and Zofran with mild improvement in her nausea.  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  She did eat a meal of some greasy chicken and [place] fries last night before the symptoms began.  Husband had same meal with no symptoms.  She denies hematemesis, hematochezia, melena, hematuria, fevers, URI symptoms.  HPI  Past Medical History:  Diagnosis Date  . Breast cancer ([place])   . Family history of ovarian cancer 09/02/2017  . Family history of prostate cancer in father 09/02/2017  . Family history of uterine cancer 09/02/2017  . Plantar fasciitis     Patient Active Problem List   Diagnosis Date Noted  . Port-A-Cath in place 11/12/2017  . Genetic testing 09/15/2017  . Family history of ovarian cancer 09/02/2017  . Family history of uterine cancer 09/02/2017  . Family history of prostate cancer in father 09/02/2017  . Malignant neoplasm of upper-inner quadrant of right breast in female, estrogen receptor positive ([place]) 08/27/2017  . Vitamin D deficiency 12/09/2015  . Medication management 12/09/2015  . Dense breast tissue 12/09/2015    History reviewed. No pertinent surgical history.  OB History    No data available       Home Medications     Prior to Admission medications   Medication Sig Start Date End Date Taking? Authorizing Provider  b complex vitamins tablet Take 1 tablet by mouth daily.    [provider]  Cholecalciferol (VITAMIN D) 2000 UNITS tablet Take 10,000 Units by mouth daily.     [provider]  Cholecalciferol (VITAMIN D) 2000 units tablet Take by mouth.    [provider]  ECHINACEA PO Take by mouth.    [provider]  Ferrous Sulfate (IRON) 325 (65 Fe) MG TABS Take by mouth daily.    [provider]  fish oil-omega-3 fatty acids 1000 MG capsule Take 1 g by mouth daily.    [provider]  Nyoka Cowden Tea 250 MG CAPS Take by mouth 2 (two) times daily.    [provider]  Iodine Strong, Lugols, (STRONG IODINE) 5 % solution Take 0.2 mLs by mouth 3 (three) times daily. 12/02/16   Vicie Mutters, PA-C  lidocaine-prilocaine (EMLA) cream Apply to affected area once Patient not taking: Reported on 10/27/2017 10/27/17   Magrinat, Virgie Dad, MD  ondansetron (ZOFRAN ODT) 8 MG disintegrating tablet 44m ODT q6 hours prn nausea 03/30/16   Forcucci, Courtney, PA-C  ondansetron (ZOFRAN) 8 MG tablet Take 1 tablet (8 mg total) by mouth 2 (two) times daily as needed (Nausea or vomiting). Patient not taking: Reported on 10/27/2017 10/27/17   Magrinat, GVirgie Dad MD  prochlorperazine (COMPAZINE) 10 MG tablet Take 1 tablet (10 mg total) by mouth every 6 (six)  hours as needed (Nausea or vomiting). Patient not taking: Reported on 10/27/2017 10/27/17   Magrinat, Virgie Dad, MD  TURMERIC PO Take by mouth.    [provider]  UNABLE TO FIND [place] Tail mushroom 500 mg    [provider]  UNABLE TO FIND [place]ake Mushroom 500 mg daily    [provider]  UNABLE TO FIND CBD Oil; patient takes for nausea    [provider]  vitamin C (ASCORBIC ACID) 500 MG tablet Take 500 mg by mouth 2 (two) times daily.    [provider]  Zinc Acetate 50 MG CAPS Take  by mouth.    [provider]    Family History Family History  Problem Relation Age of Onset  . Ovarian cancer Mother 65  . Prostate cancer Father 42       'high gleason' unsure number  . Ulcerative colitis Sister   . Other Sister        abn ovaian growth, partial hysterectomy  . Uterine cancer Maternal Aunt 36  . Stroke Maternal Aunt 66  . Basal cell carcinoma Brother   . Skin cancer Paternal Uncle   . Skin cancer Maternal Grandmother   . Stroke Paternal Grandfather 40  . Hydrocephalus Cousin     Social History Social History   Tobacco Use  . Smoking status: Never Smoker  . Smokeless tobacco: Never Used  Substance Use Topics  . Alcohol use: Yes  . Drug use: No     Allergies   Milk-related compounds and Poison ivy extract [poison ivy extract]   Review of Systems Review of Systems  Constitutional: Negative for appetite change, chills and fever.  HENT: Negative for ear pain, rhinorrhea, sneezing and sore throat.   Eyes: Negative for photophobia and visual disturbance.  Respiratory: Negative for cough, chest tightness, shortness of breath and wheezing.   Cardiovascular: Negative for chest pain and palpitations.  Gastrointestinal: Positive for diarrhea and nausea. Negative for abdominal pain, blood in stool, constipation and vomiting.  Genitourinary: Positive for dysuria. Negative for hematuria and urgency.  Musculoskeletal: Negative for myalgias.  Skin: Negative for rash.  Neurological: Negative for dizziness, weakness and light-headedness.     Physical Exam Updated Vital Signs BP 113/77 (BP Location: Left Arm)   Pulse (!) 57   Temp 97.9 F (36.6 C) (Oral)   Resp (!) 21   SpO2 100%   Physical Exam  Constitutional: She appears well-developed and well-nourished. No distress.  HENT:  Head: Normocephalic and atraumatic.  Nose: Nose normal.  Eyes: Conjunctivae and EOM are normal. Left eye exhibits no discharge. No scleral icterus.  Neck: Normal  range of motion. Neck supple.  Cardiovascular: Normal rate, regular rhythm, normal heart sounds and intact distal pulses. Exam reveals no gallop and no friction rub.  No murmur heard. Pulmonary/Chest: Effort normal and breath sounds normal. No respiratory distress.  Abdominal: Soft. Bowel sounds are normal. She exhibits no distension. There is no tenderness. There is no guarding.  Musculoskeletal: Normal range of motion. She exhibits no edema.  Neurological: She is alert. She exhibits normal muscle tone. Coordination normal.  Skin: Skin is warm and dry. No rash noted.  Psychiatric: She has a normal mood and affect.  Nursing note and vitals reviewed.    ED Treatments / Results  Labs (all labs ordered are listed, but only abnormal results are displayed) Labs Reviewed  COMPREHENSIVE METABOLIC PANEL - Abnormal; Notable for the following components:      Result Value  Potassium 3.2 (*)    Glucose, Bld 109 (*)    Total Protein 6.4 (*)    All other components within normal limits  CBC - Abnormal; Notable for the following components:   RBC 3.42 (*)    Hemoglobin 11.4 (*)    HCT 33.9 (*)    All other components within normal limits  URINALYSIS, ROUTINE W REFLEX MICROSCOPIC - Abnormal; Notable for the following components:   Color, Urine AMBER (*)    APPearance HAZY (*)    All other components within normal limits  LIPASE, BLOOD  DIFFERENTIAL  I-STAT BETA HCG BLOOD, ED ([facility], [facility], [facility] ONLY)    EKG  EKG Interpretation  Date/Time:  Saturday December 04 2017 19:20:45 EST Ventricular Rate:  53 PR Interval:    QRS Duration: 90 QT Interval:  417 QTC Calculation: 392 R Axis:   71 Text Interpretation:  Sinus rhythm Low voltage, precordial leads RSR' in V1 or V2, probably normal variant no stemi Reconfirmed by Addison Lank [phone]) on 12/04/2017 7:23:41 PM       Radiology No results found.  Procedures Procedures (including critical care time)  Medications Ordered in ED Medications   gi cocktail (Maalox,Lidocaine,Donnatal) (30 mLs Oral Given 12/04/17 1651)  sodium chloride 0.9 % bolus 1,000 mL (0 mLs Intravenous Stopped 12/04/17 1840)  potassium chloride SA (K-DUR,KLOR-CON) CR tablet 40 mEq (40 mEq Oral Given 12/04/17 1833)     Initial Impression / Assessment and Plan / ED Course  I have reviewed the triage vital signs and the nursing notes.  Pertinent labs & imaging results that were available during my care of the patient were reviewed by me and considered in my medical decision making (see chart for details).     Patient, with a past medical history of triple positive breast cancer currently on Taxol weekly and Herceptin every 3 weeks, who presents to ED for evaluation of 7 episodes of diarrhea since 10:30 PM yesterday.  She also reports intermittent nausea, feelings of heartburn and intermittent dysuria.  On physical exam she is overall well-appearing.  She has no abdominal tenderness to palpation.  She does not appear dehydrated.  EKG with no ischemic changes.  CBC, CMP, lipase, urinalysis all unremarkable with the exception of mild hypokalemia at 3.2..  HCG negative.  Patient reports significant improvement in her symptoms with GI cocktail, Zofran given here in the ED.  She is able to tolerate p.o. intake with her p.o. Potassium.  She feels as though she is ready to go home with follow-up with her PCP.  Patient does not feel like she needs to be admitted at this time and states that her symptoms are under control.  At this time I have low suspicion for acute intra-abdominal abnormality as a cause of her symptoms.  Patient appears stable for discharge at this time.  Strict return precautions given.  Portions of this note were generated with Lobbyist. Dictation errors may occur despite best attempts at proofreading.   Final Clinical Impressions(s) / ED Diagnoses   Final diagnoses:  Diarrhea, unspecified type    ED Discharge Orders    None        Delia Heady, PA-C 12/04/17 2011    Fatima Blank, MD 12/05/17 [phone]

## 2017-12-04 NOTE — ED Triage Notes (Signed)
Patient reports that she had cancer treatment yesterday and then starting at 1030am last night pt having diarrhea. Reports that now becoming nauseated.

## 2017-12-06 ENCOUNTER — Encounter: Payer: Self-pay | Admitting: Adult Health

## 2017-12-06 ENCOUNTER — Ambulatory Visit (INDEPENDENT_AMBULATORY_CARE_PROVIDER_SITE_OTHER): Payer: BC Managed Care – PPO | Admitting: Adult Health

## 2017-12-06 VITALS — BP 110/76 | HR 69 | Temp 97.7°F | Ht 66.0 in | Wt 152.0 lb

## 2017-12-06 DIAGNOSIS — L231 Allergic contact dermatitis due to adhesives: Secondary | ICD-10-CM | POA: Diagnosis not present

## 2017-12-06 DIAGNOSIS — R197 Diarrhea, unspecified: Secondary | ICD-10-CM

## 2017-12-06 MED ORDER — TRIAMCINOLONE ACETONIDE 0.1 % EX OINT: 1.0000 "application " | TOPICAL_OINTMENT | Freq: Two times a day (BID) | CUTANEOUS | 1 refills | Status: DC

## 2017-12-06 NOTE — Progress Notes (Signed)
Assessment and Plan:  Hannah Woodard was seen today for hospitalization follow-up.  Diagnoses and all orders for this visit:  Diarrhea, unspecified type Resolved -   Allergic contact dermatitis due to adhesives May use leftover from previous rash; specify known non-reactive adhesive -     triamcinolone ointment (KENALOG) 0.1 %; Apply 1 application topically 2 (two) times daily.  Other orders -     Cancel: BASIC METABOLIC PANEL WITH GFR -     After discussion with patient will defer labs as these will be obtained this week by oncology via port; diarrhea quickly resolved, was given potassium in ER - low suspicion of acute changes  Further disposition pending results of labs. Discussed med's effects and SE's.   Over 15 minutes of exam, counseling, chart review, and critical decision making was performed.   Future Appointments  Date Time Provider [place]  12/09/2017  1:00 PM [place] ECHO 1-BUZZ [id number]  12/09/2017  2:00 PM Bensimhon, Shaune Pascal, MD [facility] None  12/10/2017  9:45 AM [facility] LAB 5 [facility] None  12/10/2017 10:00 AM [facility] FLUSH NURSE 2 [facility] None  12/10/2017 11:00 AM [facility] C9 [facility] None  12/17/2017  9:15 AM [facility] LAB 2 [facility] None  12/17/2017  9:30 AM [facility] FLUSH NURSE 2 [facility] None  12/17/2017 10:00 AM Magrinat, Virgie Dad, MD [facility] None  12/17/2017 11:00 AM [facility] A2 [facility] None  12/20/2017  2:00 PM Vicie Mutters, PA-C [facility] None  12/24/2017  9:45 AM [facility] LAB 6 [facility] None  12/24/2017 10:00 AM [facility] FLUSH NURSE 2 [facility] None  12/24/2017 11:00 AM [facility] B7 [facility] None    ------------------------------------------------------------------------------------------------------------------   HPI BP 110/76   Pulse 69   Temp 97.7 F (36.5 C)   Ht 5' 6"  (1.676 m)   Wt 152 lb (68.9 kg)   SpO2 99%   BMI 24.53 kg/m   47 y.o.female presents for follow up from recent ED  visit on 3/9 -  She has a past medical history 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  Also reports feelings of heartburn and increased belching, intermittent dysuria.  She had taken 8 tablets of Imodium with no improvement in her diarrhea and Compazine and Zofran with mild improvement in her nausea. Labs were evaluated in ER and overall unremarkable; was given GI cocktail and oral potassium and sent home. She reports symptoms cleared up quickly without further issue. She has labs scheduled with oncology this Friday - after discussion we will postpone rechecking today.   Labs Reviewed  COMPREHENSIVE METABOLIC PANEL - Abnormal; Notable for the following components:      Result Value    Potassium 3.2 (*)    Glucose, Bld 109 (*)    Total Protein 6.4 (*)    All other components within normal limits  CBC - Abnormal; Notable for the following components:   RBC 3.42 (*)    Hemoglobin 11.4 (*)    HCT 33.9 (*)    All other components within normal limits  URINALYSIS, ROUTINE W REFLEX MICROSCOPIC - Abnormal; Notable for the following components:   Color, Urine AMBER (*)    APPearance HAZY (*)    All other components within normal limits  LIPASE, BLOOD  DIFFERENTIAL  I-STAT BETA HCG BLOOD, ED ([facility], [facility], [facility] ONLY)   She also presents c/o rash around port site from  bandage used in the ER; - area over port non-injected, no signs of infection; erythematous rash around border in distribution of square bandage -   Past Medical History:  Diagnosis Date  . Breast cancer ([place])   . Family history of ovarian cancer 09/02/2017  . Family history of prostate cancer in father 09/02/2017  . Family history of uterine cancer 09/02/2017  . Plantar fasciitis      Allergies  Allergen Reactions  . Adhesive [Tape]   . Milk-Related  Compounds Diarrhea    Stomach cramping  . Poison Ivy Extract [Poison Ivy Extract] Hives, Itching and Rash    Current Outpatient Medications on File Prior to Visit  Medication Sig  . b complex vitamins tablet Take 1 tablet by mouth daily.  . Cholecalciferol (VITAMIN D) 2000 UNITS tablet Take 20,000 Units by mouth daily.   Marland Kitchen ECHINACEA PO Take by mouth.  . Ferrous Sulfate (IRON) 325 (65 Fe) MG TABS Take by mouth daily.  . fish oil-omega-3 fatty acids 1000 MG capsule Take 1 g by mouth daily.  Nyoka Cowden Tea 250 MG CAPS Take by mouth 2 (two) times daily.  . Iodine Strong, Lugols, (STRONG IODINE) 5 % solution Take 0.2 mLs by mouth 3 (three) times daily.  Marland Kitchen lidocaine-prilocaine (EMLA) cream Apply to affected area once  . ondansetron (ZOFRAN ODT) 8 MG disintegrating tablet 20m ODT q6 hours prn nausea  . ondansetron (ZOFRAN) 8 MG tablet Take 1 tablet (8 mg total) by mouth 2 (two) times daily as needed (Nausea or vomiting).  . prochlorperazine (COMPAZINE) 10 MG tablet Take 1 tablet (10 mg total) by mouth every 6 (six) hours as needed (Nausea or vomiting).  . TURMERIC PO Take by mouth.  .Marland KitchenUNABLE TO FIND [place] mushroom 500 mg  . UNABLE TO FIND [place] Mushroom 500 mg daily  . UNABLE TO FIND CBD Oil; patient takes for nausea  . vitamin C (ASCORBIC ACID) 500 MG tablet Take 500 mg by mouth 2 (two) times daily.  . Zinc Acetate 50 MG CAPS Take by mouth.  . Cholecalciferol (VITAMIN D) 2000 units tablet Take by mouth.   No current facility-administered medications on file prior to visit.     ROS: all negative except above.   Physical Exam:  BP 110/76   Pulse 69   Temp 97.7 F (36.5 C)   Ht 5' 6"  (1.676 m)   Wt 152 lb (68.9 kg)   SpO2 99%   BMI 24.53 kg/m   General Appearance: Well nourished, in no apparent distress. Eyes: PERRLA, EOMs, conjunctiva no swelling or erythema Sinuses: No Frontal/maxillary tenderness ENT/Mouth: Ext aud canals clear, TMs without erythema, bulging. No erythema,  swelling, or exudate on post pharynx.  Tonsils not swollen or erythematous. Hearing normal.  Neck: Supple.  Respiratory: Respiratory effort normal, BS equal bilaterally without rales, rhonchi, wheezing or stridor.  Cardio: RRR with no MRGs. Brisk peripheral pulses without edema.  Abdomen: Soft, + BS.  Non tender, no guarding, rebound, hernias, masses. Lymphatics: Non tender without lymphadenopathy.  Musculoskeletal: Symmetrical strength, normal gait.  Skin: Warm, dry without lesions, ecchymosis. Erythematous rash around border of port site on left upper chest in distribution of adhesive bandage  Neuro: Cranial nerves intact. Normal muscle tone, no cerebellar symptoms.  Psych: Awake and oriented X 3, normal affect, Insight and Judgment appropriate.     AIzora Ribas NP 5:07 PM [facility]

## 2017-12-07 ENCOUNTER — Other Ambulatory Visit: Payer: BC Managed Care – PPO

## 2017-12-07 ENCOUNTER — Ambulatory Visit: Payer: BC Managed Care – PPO

## 2017-12-08 NOTE — Progress Notes (Signed)
[place]  [phone] [phone]     ID: ARVILLA SALADA DOB: May 25, 1971  MR#: [id number]  JEH#:[id number]  Patient Care Team: Unk Pinto, MD as PCP - General (Internal Medicine) Jovita Kussmaul, MD as Consulting Physician (General Surgery) Magrinat, Virgie Dad, MD as Consulting Physician (Oncology) Kyung Rudd, MD as Consulting Physician (Radiation Oncology) Lorelle Gibbs, MD (Radiology) Howard-McNatt, Mable Fill, MD as Referring Physician (Surgery) OTHER MD:  CHIEF COMPLAINT: Triple positive breast cancer  CURRENT TREATMENT: Adjuvant chemotherapy, anti-HER-2 immunotherapy, goserelin  INTERVAL HISTORY: Hannah Woodard returns today for follow-up and treatment of her triple positive breast cancer. Today is day 1 cycle 6 of paclitaxel.  She is having some fatigue but otherwise no other side effects from this medication and in particular no peripheral neuropathy.  She also receives trastuzumab every 21 days, with her most recent dose on 12/03/2017. Her next dose will be 12/24/2017.  Her echocardiogram 12/09/2017 showed an ejection fraction in the 60-65% range.  She received her sixth goserelin dose on 11/26/2017.  She is not having unusual menopausal symptoms related to this  REVIEW OF SYSTEMS: Hannah Woodard had been experiencing dysuria, but it has since resolved. Her last bowel movement was yesterday and it was hard. Stating she has been more constipated lately. She notes that it was painful. She has been very fatigued and notes falling asleep earlier. She also is experiencing a pain in her mouth. Hannah Woodard has been experiencing some arthralgias and reports that she hasn't been taking her calcium supplements since she has run out. She also has a rash on her back. She denies unusual headaches, visual changes, nausea, vomiting, or dizziness. There has been no unusual cough, phlegm production, or pleurisy. She denies unexplained weight loss, bleeding, or fever. A  detailed review of systems was otherwise noncontributory.    HISTORY OF CURRENT ILLNESS: From the original intake note:  The patient has a history of cysts and nodules in the right breast which have been closely followed, with exams 07/30/2016 and 01/28/2017.  On 07/29/2017 follow-up right breast ultrasonography showed no findings of concern.  Bilateral diagnostic mammography 08/18/2017 with right breast ultrasonography on 08/18/2017 at [facility] found the breast density to be category C.  There was now a new irregular high density mass in the posterior portion of the right breast seen on the mediolateral oblique view only.  Ultrasonography confirmed a 1.4 cm lobulated mass in the upper inner quadrant of the right breast 10 cm from the nipple associated with pectoral muscle invasion.  The right axilla was sonographically benign.  Biopsy of this mass 08/24/2017 showed ([id number]) invasive ductal carcinoma, grade 2, estrogen receptor 95% positive, progesterone receptor 100% positive, both with strong staining intensity, with an MIB-1 of 60%, and HER-2 amplified, with a signals ratio of 2.84 (full report pending).  The patient's subsequent history is as detailed below.  PAST MEDICAL HISTORY: Past Medical History:  Diagnosis Date  . Breast cancer ([place])   . Family history of ovarian cancer 09/02/2017  . Family history of prostate cancer in father 09/02/2017  . Family history of uterine cancer 09/02/2017  . Plantar fasciitis     PAST SURGICAL HISTORY: No past surgical history on file.  FAMILY HISTORY Family History  Problem Relation Age of Onset  . Ovarian cancer Mother 35  . Prostate cancer Father 61       'high gleason' unsure number  . Ulcerative colitis Sister   . Other Sister  abn ovaian growth, partial hysterectomy  . Uterine cancer Maternal Aunt 29  . Stroke Maternal Aunt 66  . Basal cell carcinoma Brother   . Skin cancer Paternal Uncle   . Skin cancer Maternal Grandmother     . Stroke Paternal Grandfather 39  . Hydrocephalus Cousin   The patient's father died from alcoholic cirrhosis at age 96.  He had been diagnosed with prostate cancer at age 62.  The patient's mother was diagnosed with ovarian cancer at age 54 and died a year later.  The patient has 1 brother, 1 sister.  The patient's brother has had basal cell and other skin cancers.  A paternal aunt had uterine cancer.  Other relatives have had skin cancers but the patient does not know if these were melanomas or not  GYNECOLOGIC HISTORY:  No LMP recorded. (Menstrual status: Chemotherapy).  Menarche age 26, the patient has never been pregnant.  She is still having regular periods.  She used oral contraceptives briefly in the past without complications. She and her husband are very interested in having children, and currently (December 2018) they are not using any contraception.   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  Her husband Rodman Key (goes by [employer]") is a Dealer.  At home it's just them, a [employer] and 2 cats.    ADVANCED DIRECTIVES: Not in place   HEALTH MAINTENANCE: Social History   Tobacco Use  . Smoking status: Never Smoker  . Smokeless tobacco: Never Used  Substance Use Topics  . Alcohol use: Yes  . Drug use: No     Colonoscopy: Never  PAP:  Bone density: Never   Allergies  Allergen Reactions  . Adhesive [Tape]   . Milk-Related Compounds Diarrhea    Stomach cramping  . Poison Ivy Extract [Poison Ivy Extract] Hives, Itching and Rash    Current Outpatient Medications  Medication Sig Dispense Refill  . b complex vitamins tablet Take 1 tablet by mouth daily.    . Cholecalciferol (VITAMIN D) 2000 UNITS tablet Take 20,000 Units by mouth daily.     . ciprofloxacin (CIPRO) 500 MG tablet Take 1 tablet (500 mg total) by mouth 2 (two) times daily. 6 tablet 0  . Collagenase POWD by Does not apply route.     Marland Kitchen ECHINACEA PO Take by mouth.    . Ferrous Sulfate (IRON) 325 (65 Fe) MG TABS Take by mouth daily.    . fish oil-omega-3 fatty acids 1000 MG capsule Take 1 g by mouth daily.    Nyoka Cowden Tea 250 MG CAPS Take by mouth 2 (two) times daily.    . Iodine Strong, Lugols, (STRONG IODINE) 5 % solution Take 0.2 mLs by mouth 3 (three) times daily. 480 mL 0  . lidocaine-prilocaine (EMLA) cream Apply to affected area once 30 g 3  . magic mouthwash w/lidocaine SOLN Take 5 mLs by mouth 4 (four) times daily as needed for mouth pain. Swish and spit 240 mL 1  . ondansetron (ZOFRAN ODT) 8 MG disintegrating tablet 76m ODT q6 hours prn nausea 30 tablet 0  . ondansetron (ZOFRAN) 8 MG tablet Take 1 tablet (8 mg total) by mouth 2 (two) times daily as needed (Nausea or vomiting). 30 tablet 1  . prochlorperazine (COMPAZINE) 10 MG tablet Take 1 tablet (10 mg total) by mouth every 6 (six) hours as needed (Nausea or vomiting). 30 tablet 1  . triamcinolone ointment (KENALOG) 0.1 % Apply  1 application topically 2 (two) times daily. 80 g 1  . TURMERIC PO Take by mouth.    Marland Kitchen UNABLE TO FIND [place] mushroom 500 mg    . UNABLE TO FIND [place] Mushroom 500 mg daily    . UNABLE TO FIND CBD Oil; patient takes for nausea    . vitamin C (ASCORBIC ACID) 500 MG tablet Take 500 mg by mouth 2 (two) times daily.    . Zinc Acetate 50 MG CAPS Take by mouth.     No current facility-administered medications for this visit.     OBJECTIVE: Young white woman who appears stated age  47:   12/17/17 1002  BP: 131/69  Pulse: 78  Resp: 18  Temp: 97.8 F (36.6 C)  SpO2: 99%     Body mass index is 24.26 kg/m.   Wt Readings from Last 3 Encounters:  12/17/17 150 lb 4.8 oz (68.2 kg)  12/09/17 151 lb (68.5 kg)  12/06/17 152 lb (68.9 kg)  ECOG FS:1 - Symptomatic but completely ambulatory  Sclerae unicteric, EOMs intact No cervical or supraclavicular adenopathy Lungs no rales or rhonchi Heart regular rate and rhythm Abd soft,  nontender, positive bowel sounds MSK no focal spinal tenderness, no upper extremity lymphedema Neuro: nonfocal, well oriented, appropriate affect Breasts: Status post bilateral reduction mammoplasty, with right sided lumpectomy and axillary lymph node sampling.  There is no dehiscence or swelling.  There is minimal residual erythema in the inferior aspect of the right breast.  There is some tenderness to palpation in the right axilla.  There is no evidence of local recurrence Skin: There is a scattered rash in the lower right back which does not cross the midline, is not palpable, and has no vesiculation.  It extends over approximately 6 cm with the largest lesion being approximately 1-1/2 mm  LAB RESULTS:  CMP     Component Value Date/Time   NA 142 12/17/2017 0911   NA 138 09/01/2017 1238   K 3.6 12/17/2017 0911   K 4.4 09/01/2017 1238   CL 106 12/17/2017 0911   CO2 28 12/17/2017 0911   CO2 25 09/01/2017 1238   GLUCOSE 119 12/17/2017 0911   GLUCOSE 83 09/01/2017 1238   BUN 12 12/17/2017 0911   BUN 9.2 09/01/2017 1238   CREATININE 0.66 12/17/2017 0911   CREATININE 0.7 09/01/2017 1238   CALCIUM 9.3 12/17/2017 0911   CALCIUM 9.4 09/01/2017 1238   PROT 6.5 12/17/2017 0911   PROT 7.4 09/01/2017 1238   ALBUMIN 3.8 12/17/2017 0911   ALBUMIN 4.2 09/01/2017 1238   AST 17 12/17/2017 0911   AST 21 09/01/2017 1238   ALT 27 12/17/2017 0911   ALT 21 09/01/2017 1238   ALKPHOS 52 12/17/2017 0911   ALKPHOS 60 09/01/2017 1238   BILITOT 0.6 12/17/2017 0911   BILITOT 0.65 09/01/2017 1238   GFRNONAA >60 12/17/2017 0911   GFRNONAA 111 05/18/2017 0928   GFRAA >60 12/17/2017 0911   GFRAA 128 05/18/2017 0928    No results found for: TOTALPROTELP, ALBUMINELP, A1GS, A2GS, BETS, BETA2SER, GAMS, MSPIKE, SPEI  No results found for: KPAFRELGTCHN, LAMBDASER, KAPLAMBRATIO  Lab Results  Component Value Date   WBC 4.9 12/17/2017   NEUTROABS 3.4 12/17/2017   HGB 11.4 (L) 12/04/2017   HCT 32.8 (L)  12/17/2017   MCV 98.4 12/17/2017   PLT 306 12/17/2017      Chemistry      Component Value Date/Time   NA 142 12/17/2017 0911   NA  138 09/01/2017 1238   K 3.6 12/17/2017 0911   K 4.4 09/01/2017 1238   CL 106 12/17/2017 0911   CO2 28 12/17/2017 0911   CO2 25 09/01/2017 1238   BUN 12 12/17/2017 0911   BUN 9.2 09/01/2017 1238   CREATININE 0.66 12/17/2017 0911   CREATININE 0.7 09/01/2017 1238      Component Value Date/Time   CALCIUM 9.3 12/17/2017 0911   CALCIUM 9.4 09/01/2017 1238   ALKPHOS 52 12/17/2017 0911   ALKPHOS 60 09/01/2017 1238   AST 17 12/17/2017 0911   AST 21 09/01/2017 1238   ALT 27 12/17/2017 0911   ALT 21 09/01/2017 1238   BILITOT 0.6 12/17/2017 0911   BILITOT 0.65 09/01/2017 1238       No results found for: LABCA2  No components found for: RJJOAC166  No results for input(s): INR in the last 168 hours.  No results found for: LABCA2  No results found for: AYT016  No results found for: WFU932  No results found for: TFT732  No results found for: CA2729  No components found for: HGQUANT  No results found for: CEA1 / No results found for: CEA1   No results found for: AFPTUMOR  No results found for: CHROMOGRNA  No results found for: PSA1  Appointment on 12/17/2017  Component Date Value Ref Range Status  . Sodium 12/17/2017 142  136 - 145 mmol/L Final  . Potassium 12/17/2017 3.6  3.5 - 5.1 mmol/L Final  . Chloride 12/17/2017 106  98 - 109 mmol/L Final  . CO2 12/17/2017 28  22 - 29 mmol/L Final  . Glucose, Bld 12/17/2017 119  70 - 140 mg/dL Final  . BUN 12/17/2017 12  7 - 26 mg/dL Final  . Creatinine 12/17/2017 0.66  0.60 - 1.10 mg/dL Final  . Calcium 12/17/2017 9.3  8.4 - 10.4 mg/dL Final  . Total Protein 12/17/2017 6.5  6.4 - 8.3 g/dL Final  . Albumin 12/17/2017 3.8  3.5 - 5.0 g/dL Final  . AST 12/17/2017 17  5 - 34 U/L Final  . ALT 12/17/2017 27  0 - 55 U/L Final  . Alkaline Phosphatase 12/17/2017 52  40 - 150 U/L Final  . Total  Bilirubin 12/17/2017 0.6  0.2 - 1.2 mg/dL Final  . GFR, Est Non Af Am 12/17/2017 >60  >60 mL/min Final  . GFR, Est AFR Am 12/17/2017 >60  >60 mL/min Final   Comment: (NOTE) The eGFR has been calculated using the CKD EPI equation. This calculation has not been validated in all clinical situations. eGFR's persistently <60 mL/min signify possible Chronic Kidney Disease.   Georgiann Hahn gap 12/17/2017 8  3 - 11 Final   Performed at [facility], [place] [address] [place], [place] [address]  . WBC Count 12/17/2017 4.9  3.9 - 10.3 K/uL Final  . RBC 12/17/2017 3.34* 3.70 - 5.45 MIL/uL Final  . Hemoglobin 12/17/2017 11.3* 11.6 - 15.9 g/dL Final  . HCT 12/17/2017 32.8* 34.8 - 46.6 % Final  . MCV 12/17/2017 98.4  79.5 - 101.0 fL Final  . MCH 12/17/2017 34.0  25.1 - 34.0 pg Final  . MCHC 12/17/2017 34.5  31.5 - 36.0 g/dL Final  . RDW 12/17/2017 14.7* 11.2 - 14.5 % Final  . Platelet Count 12/17/2017 306  145 - 400 K/uL Final  . Neutrophils Relative % 12/17/2017 70  % Final  . Neutro Abs 12/17/2017 3.4  1.5 - 6.5 K/uL Final  . Lymphocytes Relative 12/17/2017  19  % Final  . Lymphs Abs 12/17/2017 0.9  0.9 - 3.3 K/uL Final  . Monocytes Relative 12/17/2017 6  % Final  . Monocytes Absolute 12/17/2017 0.3  0.1 - 0.9 K/uL Final  . Eosinophils Relative 12/17/2017 4  % Final  . Eosinophils Absolute 12/17/2017 0.2  0.0 - 0.5 K/uL Final  . Basophils Relative 12/17/2017 1  % Final  . Basophils Absolute 12/17/2017 0.0  0.0 - 0.1 K/uL Final   Performed at [facility], [place] [address], [place], [place] [address]  Appointment on 12/15/2017  Component Date Value Ref Range Status  . Color, Urine 12/15/2017 YELLOW  YELLOW Final  . APPearance 12/15/2017 HAZY* CLEAR Final  . Specific Gravity, Urine 12/15/2017 1.009  1.005 - 1.030 Final  . pH 12/15/2017 6.0  5.0 - 8.0 Final  . Glucose, UA 12/15/2017 NEGATIVE  NEGATIVE mg/dL Final  . Hgb urine dipstick 12/15/2017 SMALL*  NEGATIVE Final  . Bilirubin Urine 12/15/2017 NEGATIVE  NEGATIVE Final  . Ketones, ur 12/15/2017 NEGATIVE  NEGATIVE mg/dL Final  . Protein, ur 12/15/2017 NEGATIVE  NEGATIVE mg/dL Final  . Nitrite 12/15/2017 NEGATIVE  NEGATIVE Final  . Leukocytes, UA 12/15/2017 TRACE* NEGATIVE Final  . RBC / HPF 12/15/2017 0-5  0 - 5 RBC/hpf Final  . WBC, UA 12/15/2017 0-5  0 - 5 WBC/hpf Final  . Bacteria, UA 12/15/2017 NONE SEEN  NONE SEEN Final  . Squamous Epithelial / LPF 12/15/2017 NONE SEEN  NONE SEEN Final  . Mucus 12/15/2017 PRESENT   Final   Performed at [facility], [place] [address] [place], [place] [address]  . Specimen Description 12/15/2017    Final                   Value:URINE, CLEAN CATCH Performed at [facility], [place] [address] [place], [place] [address]   . Special Requests 12/15/2017    Final                   Value:NONE Performed at [facility], [place] [address] [place], [place] [address]   . Culture 12/15/2017 MULTIPLE SPECIES PRESENT, SUGGEST RECOLLECTION*  Final  . Report Status 12/15/2017 12/16/2017 FINAL   Final    (this displays the last labs from the last 3 days)  No results found for: TOTALPROTELP, ALBUMINELP, A1GS, A2GS, BETS, BETA2SER, GAMS, MSPIKE, SPEI (this displays SPEP labs)  No results found for: KPAFRELGTCHN, LAMBDASER, KAPLAMBRATIO (kappa/lambda light chains)  No results found for: HGBA, HGBA2QUANT, HGBFQUANT, HGBSQUAN (Hemoglobinopathy evaluation)   No results found for: LDH  Lab Results  Component Value Date   IRON 128 12/16/2016   TIBC 355 12/16/2016   IRONPCTSAT 36 12/16/2016   (Iron and TIBC)  Lab Results  Component Value Date   FERRITIN 67 12/16/2016    Urinalysis    Component Value Date/Time   COLORURINE YELLOW 12/15/2017 1520   APPEARANCEUR HAZY (A) 12/15/2017 1520   LABSPEC 1.009 12/15/2017 1520   PHURINE 6.0 12/15/2017 1520   GLUCOSEU NEGATIVE 12/15/2017 1520    HGBUR SMALL (A) 12/15/2017 1520   BILIRUBINUR NEGATIVE 12/15/2017 1520   KETONESUR NEGATIVE 12/15/2017 1520   PROTEINUR NEGATIVE 12/15/2017 1520   UROBILINOGEN 0.2 12/06/2014 1529   NITRITE NEGATIVE 12/15/2017 1520   LEUKOCYTESUR TRACE (A) 12/15/2017 1520    STUDIES: No results found.   ELIGIBLE FOR AVAILABLE RESEARCH PROTOCOL: No  ASSESSMENT: 47 y.o. [place] woman status post right breast  upper inner quadrant biopsy 08/24/2017 for a clinical T1c N0 invasive ductal carcinoma, triple positive, with an MIB-1 of 60%  (1) genetics testing 09/10/2017 through the Common Hereditary Cancer Panel + Melanoma Panel found no deleterious mutations in: APC, ATM, AXIN2, BAP1, BARD1, BMPR1A, BRCA1, BRCA2, BRIP1, CDH1, CDK4, CDKN2A (p14ARF), CDKN2A (p16INK4a), CHEK2, CTNNA1, DICER1, EPCAM*, GREM1*, KIT, MEN1, MLH1, MSH2, MSH3, MSH6, MUTYH, NBN, NF1, PALB2, PDGFRA, PMS2, POLD1, POLE, POT1, PTEN, RAD50, RAD51C, RAD51D, RB1, SDHB, SDHC, SDHD, SMAD4, SMARCA4, STK11, TP53, TSC1, TSC2, VHL. The following genes were evaluated for sequence changes only: HOXB13*, MITF*, NTHL1*, SDHA  (2) status post right lumpectomy and sentinel axillary lymph node sampling 10/07/2017 at [facility] for a pT1C pN0, stage IA invasive ductal carcinoma, grade 3, with negative margins  (a) 1 sentinel lymph node removed, bilateral oncoplastic breast reduction  (3) adjuvant chemotherapy consisting of paclitaxel weekly x12 and trastuzumab every 21 days STARTING 11/12/2017  (4) continue trastuzumab to complete a year  (a) baseline echocardiogram 09/10/2017 shows an ejection fraction of 55-60%  (b) echocardiogram 12/09/2017 showed an ejection fraction in the 60-65% range  (5) adjuvant radiation to follow chemo therapy  (6) adjuvant antiestrogens to start at the completion of local treatment  (7) goserelin for fertility preservation started 09/29/2017  PLAN:  Hannah Woodard is doing generally well with her treatment and we are proceeding  with her sixth dose of paclitaxel today.  She understands she may become somewhat more tired as the treatment progresses.  What concerns me however is the possibility of neuropathy.  From this point were going to start seeing her every 2 weeks, but if she has any concern at all regarding neuropathy on a treatment day when we are not seeing it she will ask to see either me or my nurse practitioner or partner before she gets treated.  She is doing well with the trastuzumab and the plan will be to continue that for a total of 1 year.  Her most recent echocardiogram was very favorable.  She is also on goserelin for fertility preservation.  We can discontinue this when the Taxol is completed.  She is beginning to consider radiation issues.  We have made an appointment for her with Dr. Lisbeth Renshaw as she tries to decide whether to get treated here or at [facility]  She knows to call for any other issues that may develop before the next visit. Magrinat, Virgie Dad, MD  12/17/17 10:33 AM Medical Oncology and Hematology [facility] [address] [place], [place] [address] Tel. [phone]    Fax. [phone]  This document serves as a record of services personally performed by Chauncey Cruel, MD. It was created on his behalf by Margit Banda, a trained medical scribe. The creation of this record is based on the scribe's personal observations and the provider's statements to them.   I have reviewed the above documentation for accuracy and completeness, and I agree with the above.

## 2017-12-09 ENCOUNTER — Ambulatory Visit (HOSPITAL_BASED_OUTPATIENT_CLINIC_OR_DEPARTMENT_OTHER)
Admission: RE | Admit: 2017-12-09 | Discharge: 2017-12-09 | Disposition: A | Discharge status: Home / Self Care | Payer: BC Managed Care – PPO | Source: Ambulatory Visit | Attending: Internal Medicine | Admitting: Internal Medicine

## 2017-12-09 ENCOUNTER — Ambulatory Visit (HOSPITAL_COMMUNITY)
Admission: RE | Admit: 2017-12-09 | Discharge: 2017-12-09 | Disposition: A | Discharge status: Home / Self Care | Payer: BC Managed Care – PPO | Source: Ambulatory Visit | Attending: Oncology | Admitting: Oncology

## 2017-12-09 VITALS — BP 108/70 | HR 71 | Wt 151.0 lb

## 2017-12-09 DIAGNOSIS — Z170 Estrogen receptor positive status [ER+]: Secondary | ICD-10-CM | POA: Insufficient documentation

## 2017-12-09 DIAGNOSIS — C50211 Malignant neoplasm of upper-inner quadrant of right female breast: Secondary | ICD-10-CM

## 2017-12-09 NOTE — Progress Notes (Signed)
  Echocardiogram 2D Echocardiogram has been performed.  Hannah Woodard 12/09/2017, 1:54 PM

## 2017-12-09 NOTE — Patient Instructions (Signed)
Your physician recommends that you schedule a follow-up appointment in: 3 months with echocardiogram

## 2017-12-09 NOTE — Progress Notes (Signed)
CARDIO-ONCOLOGY CLINIC CONSULT NOTE  Referring Physician: Magrinat   HPI:  Infant is 47 y.o. female with right breast cancer referred by Dr. Jana Hakim for enrollment into the Cardio-Oncology program.  Biopsy of right breast mass 08/24/2017 showed ([id number]) invasive ductal carcinoma, grade 2, estrogen receptor 95% positive, progesterone receptor 100% positive, both with strong staining intensity, with an MIB-1 of 60%, and HER-2 amplified, with a signals ratio of 2.84 (full report pending).  ONCOLOGY HISTORY  (1) status post right lumpectomy and sentinel axillary lymph node sampling 10/07/2017 at [facility] for a pT1C pN0, stage IA invasive ductal carcinoma, grade 3, with negative margins             (a) 1 sentinel lymph node removed, bilateral oncoplastic breast reduction  (2) adjuvant chemotherapy consisting of paclitaxel weekly x12 and trastuzumab every 21 days STARTING 11/12/2017  (3) continue trastuzumab to complete a year  (4) adjuvant radiation to follow therapy  (5) adjuvant antiestrogens to follow at the completion of local treatment  (5) goserelin for fertility preservation started 09/29/2017   Denies any h/o known heart disease. Walks dog regularly and uses a Physiological scientist and working out regularly. Denies CP, SOB, palpitations or edema. Tolerating therapy without too much problem.  ECHO: 58-09% normal diastolic function  GLS -21.5%Reviewed personally.    Review of Systems: [y] = yes, [ ]  = no   General: Weight gain [ ] ; Weight loss [ ] ; Anorexia [ ] ; Fatigue [ ] ; Fever [ ] ; Chills [ ] ; Weakness [ ]   Cardiac: Chest pain/pressure [ ] ; Resting SOB [ ] ; Exertional SOB [ ] ; Orthopnea [ ] ; Pedal Edema [ ] ; Palpitations [ ] ; Syncope [ ] ; Presyncope [ ] ; Paroxysmal nocturnal dyspnea[ ]   Pulmonary: Cough [ ] ; Wheezing[ ] ; Hemoptysis[ ] ; Sputum [ ] ; Snoring [ ]   GI: Vomiting[ ] ; Dysphagia[ ] ; Melena[ ] ; Hematochezia [ ] ; Heartburn[ ] ; Abdominal pain [ ] ;  Constipation [ ] ; Diarrhea [ ] ; BRBPR [ ]   GU: Hematuria[ ] ; Dysuria [ ] ; Nocturia[ ]   Vascular: Pain in legs with walking [ ] ; Pain in feet with lying flat [ ] ; Non-healing sores [ ] ; Stroke [ ] ; TIA [ ] ; Slurred speech [ ] ;  Neuro: Headaches[ ] ; Vertigo[ ] ; Seizures[ ] ; Paresthesias[ ] ;Blurred vision [ ] ; Diplopia [ ] ; Vision changes [ ]   Ortho/Skin: Arthritis [ ] ; Joint pain [ ] ; Muscle pain [ ] ; Joint swelling [ ] ; Back Pain [ ] ; Rash [ ]   Psych: Depression[ ] ; Anxiety[ ]   Heme: Bleeding problems [ ] ; Clotting disorders [ ] ; Anemia [ ]   Endocrine: Diabetes [ ] ; Thyroid dysfunction[ ]    Past Medical History:  Diagnosis Date  . Breast cancer ([place])   . Family history of ovarian cancer 09/02/2017  . Family history of prostate cancer in father 09/02/2017  . Family history of uterine cancer 09/02/2017  . Plantar fasciitis     Current Outpatient Medications  Medication Sig Dispense Refill  . b complex vitamins tablet Take 1 tablet by mouth daily.    . Cholecalciferol (VITAMIN D) 2000 UNITS tablet Take 20,000 Units by mouth daily.     . Collagenase POWD by Does not apply route.    Marland Kitchen ECHINACEA PO Take by mouth.    . Ferrous Sulfate (IRON) 325 (65 Fe) MG TABS Take by mouth daily.    . fish oil-omega-3 fatty acids 1000 MG capsule Take 1 g by mouth daily.    Nyoka Cowden Tea 250 MG CAPS Take by  mouth 2 (two) times daily.    . Iodine Strong, Lugols, (STRONG IODINE) 5 % solution Take 0.2 mLs by mouth 3 (three) times daily. 480 mL 0  . lidocaine-prilocaine (EMLA) cream Apply to affected area once 30 g 3  . ondansetron (ZOFRAN ODT) 8 MG disintegrating tablet 32m ODT q6 hours prn nausea 30 tablet 0  . ondansetron (ZOFRAN) 8 MG tablet Take 1 tablet (8 mg total) by mouth 2 (two) times daily as needed (Nausea or vomiting). 30 tablet 1  . prochlorperazine (COMPAZINE) 10 MG tablet Take 1 tablet (10 mg total) by mouth every 6 (six) hours as needed (Nausea or vomiting). 30 tablet 1  . triamcinolone ointment  (KENALOG) 0.1 % Apply 1 application topically 2 (two) times daily. 80 g 1  . TURMERIC PO Take by mouth.    .Marland KitchenUNABLE TO FIND [place] mushroom 500 mg    . UNABLE TO FIND [place] Mushroom 500 mg daily    . UNABLE TO FIND CBD Oil; patient takes for nausea    . vitamin C (ASCORBIC ACID) 500 MG tablet Take 500 mg by mouth 2 (two) times daily.    . Zinc Acetate 50 MG CAPS Take by mouth.     No current facility-administered medications for this encounter.     Allergies  Allergen Reactions  . Adhesive [Tape]   . Milk-Related Compounds Diarrhea    Stomach cramping  . Poison Ivy Extract [Poison Ivy Extract] Hives, Itching and Rash      Social History   Socioeconomic History  . Marital status: Single    Spouse name: Not on file  . Number of children: Not on file  . Years of education: Not on file  . Highest education level: Not on file  Social Needs  . Financial resource strain: Not on file  . Food insecurity - worry: Not on file  . Food insecurity - inability: Not on file  . Transportation needs - medical: Not on file  . Transportation needs - non-medical: Not on file  Occupational History  . Not on file  Tobacco Use  . Smoking status: Never Smoker  . Smokeless tobacco: Never Used  Substance and Sexual Activity  . Alcohol use: Yes  . Drug use: No  . Sexual activity: Not on file  Other Topics Concern  . Not on file  Social History Narrative  . Not on file      Family History  Problem Relation Age of Onset  . Ovarian cancer Mother 567 . Prostate cancer Father 759      'high gleason' unsure number  . Ulcerative colitis Sister   . Other Sister        abn ovaian growth, partial hysterectomy  . Uterine cancer Maternal Aunt 547 . Stroke Maternal Aunt 66  . Basal cell carcinoma Brother   . Skin cancer Paternal Uncle   . Skin cancer Maternal Grandmother   . Stroke Paternal Grandfather 647 . Hydrocephalus Cousin     Vitals:   12/09/17 1357  BP: 108/70  Pulse: 71    SpO2: 98%  Weight: 151 lb (68.5 kg)    PHYSICAL EXAM: General:  Well appearing. No respiratory difficulty HEENT: normal Neck: supple. no JVD. Carotids 2+ bilat; no bruits. No lymphadenopathy or thryomegaly appreciated. Cor: PMI nondisplaced. Regular rate & rhythm. No rubs, gallops or murmurs. + port-a-cath Lungs: clear Abdomen: soft, nontender, nondistended. No hepatosplenomegaly. No bruits or masses. Good bowel sounds. Extremities: no cyanosis,  clubbing, rash, edema Neuro: alert & oriented x 3, cranial nerves grossly intact. moves all 4 extremities w/o difficulty. Affect pleasant.   ASSESSMENT & PLAN: 1.  Right breast cancer - triple positive. S/p lumpectomy  - Explained incidence of Herceptin cardiotoxicity and role of Cardio-oncology clinic at length. Echo images reviewed personally. All parameters stable. Reviewed signs and symptoms of HF to look for. Continue Herceptin. Follow-up with echo in 3 months.   Glori Bickers, MD  2:14 PM

## 2017-12-10 ENCOUNTER — Inpatient Hospital Stay: Payer: BC Managed Care – PPO

## 2017-12-10 DIAGNOSIS — C50211 Malignant neoplasm of upper-inner quadrant of right female breast: Secondary | ICD-10-CM

## 2017-12-10 DIAGNOSIS — Z170 Estrogen receptor positive status [ER+]: Principal | ICD-10-CM

## 2017-12-10 DIAGNOSIS — Z95828 Presence of other vascular implants and grafts: Secondary | ICD-10-CM

## 2017-12-10 LAB — CMP (CANCER CENTER ONLY)
ALBUMIN: 3.9 g/dL (ref 3.5–5.0)
ALK PHOS: 55 U/L (ref 40–150)
ALT: 32 U/L (ref 0–55)
AST: 25 U/L (ref 5–34)
Anion gap: 7 (ref 3–11)
BUN: 11 mg/dL (ref 7–26)
CALCIUM: 9.8 mg/dL (ref 8.4–10.4)
CHLORIDE: 105 mmol/L (ref 98–109)
CO2: 29 mmol/L (ref 22–29)
CREATININE: 0.65 mg/dL (ref 0.60–1.10)
GFR, Estimated: >60 mL/min (ref >60)
GLUCOSE: 89 mg/dL (ref 70–140)
Potassium: 3.8 mmol/L (ref 3.5–5.1)
SODIUM: 141 mmol/L (ref 136–145)
Total Bilirubin: 0.8 mg/dL (ref 0.2–1.2)
Total Protein: 6.6 g/dL (ref 6.4–8.3)

## 2017-12-10 LAB — CBC WITH DIFFERENTIAL (CANCER CENTER ONLY)
Basophils Absolute: 0 10*3/uL (ref 0.0–0.1)
Basophils Relative: 1 %
EOS ABS: 0.1 10*3/uL (ref 0.0–0.5)
Eosinophils Relative: 3 %
HEMATOCRIT: 34.8 % (ref 34.8–46.6)
HEMOGLOBIN: 11.7 g/dL (ref 11.6–15.9)
LYMPHS ABS: 1.1 10*3/uL (ref 0.9–3.3)
Lymphocytes Relative: 27 %
MCH: 33.3 pg (ref 25.1–34.0)
MCHC: 33.6 g/dL (ref 31.5–36.0)
MCV: 99.1 fL (ref 79.5–101.0)
MONO ABS: 0.3 10*3/uL (ref 0.1–0.9)
MONOS PCT: 7 %
NEUTROS ABS: 2.6 10*3/uL (ref 1.5–6.5)
NEUTROS PCT: 62 %
Platelet Count: 262 10*3/uL (ref 145–400)
RBC: 3.51 MIL/uL — ABNORMAL LOW (ref 3.70–5.45)
RDW: 14.3 % (ref 11.2–14.5)
WBC Count: 4.2 10*3/uL (ref 3.9–10.3)

## 2017-12-10 MED ORDER — SODIUM CHLORIDE 0.9 % IV SOLN
10.0000 mg | Freq: Once | INTRAVENOUS | Status: AC
  Administered 2017-12-10: 10 mg via INTRAVENOUS
  Filled 2017-12-10: qty 1

## 2017-12-10 MED ORDER — DIPHENHYDRAMINE HCL 50 MG/ML IJ SOLN
INTRAMUSCULAR | Status: AC
  Filled 2017-12-10: qty 1

## 2017-12-10 MED ORDER — DIPHENHYDRAMINE HCL 50 MG/ML IJ SOLN
25.0000 mg | Freq: Once | INTRAMUSCULAR | Status: AC
  Administered 2017-12-10: 25 mg via INTRAVENOUS

## 2017-12-10 MED ORDER — HEPARIN SOD (PORK) LOCK FLUSH 100 UNIT/ML IV SOLN
500.0000 [IU] | Freq: Once | INTRAVENOUS | Status: AC | PRN
  Administered 2017-12-10: 500 [IU]
  Filled 2017-12-10: qty 5

## 2017-12-10 MED ORDER — SODIUM CHLORIDE 0.9% FLUSH
10.0000 mL | Freq: Once | INTRAVENOUS | Status: AC
  Administered 2017-12-10: 10 mL
  Filled 2017-12-10: qty 10

## 2017-12-10 MED ORDER — SODIUM CHLORIDE 0.9 % IV SOLN
80.0000 mg/m2 | Freq: Once | INTRAVENOUS | Status: AC
  Administered 2017-12-10: 150 mg via INTRAVENOUS
  Filled 2017-12-10: qty 25

## 2017-12-10 MED ORDER — SODIUM CHLORIDE 0.9 % IV SOLN: Freq: Once | INTRAVENOUS | Status: DC

## 2017-12-10 MED ORDER — SODIUM CHLORIDE 0.9% FLUSH
10.0000 mL | INTRAVENOUS | Status: DC | PRN
  Administered 2017-12-10: 10 mL
  Filled 2017-12-10: qty 10

## 2017-12-10 MED ORDER — DEXAMETHASONE SODIUM PHOSPHATE 10 MG/ML IJ SOLN
INTRAMUSCULAR | Status: AC
  Filled 2017-12-10: qty 1

## 2017-12-10 MED ORDER — DEXAMETHASONE SODIUM PHOSPHATE 10 MG/ML IJ SOLN: 10.0000 mg | Freq: Once | INTRAMUSCULAR | Status: DC

## 2017-12-10 MED ORDER — FAMOTIDINE IN NACL 20-0.9 MG/50ML-% IV SOLN
20.0000 mg | Freq: Once | INTRAVENOUS | Status: AC
  Administered 2017-12-10: 20 mg via INTRAVENOUS
  Filled 2017-12-10: qty 50

## 2017-12-10 MED ORDER — SODIUM CHLORIDE 0.9 % IV SOLN
10.0000 mg | Freq: Once | INTRAVENOUS | Status: DC
  Administered 2017-12-10: 10 mg via INTRAVENOUS

## 2017-12-10 NOTE — Patient Instructions (Signed)
[facility] Discharge Instructions for Patients Receiving Chemotherapy  Today you received the following chemotherapy agents:  Taxol.  To help prevent nausea and vomiting after your treatment, we encourage you to take your nausea medication as directed.   If you develop nausea and vomiting that is not controlled by your nausea medication, call the clinic.   BELOW ARE SYMPTOMS THAT SHOULD BE REPORTED IMMEDIATELY:  *FEVER GREATER THAN 100.5 F  *CHILLS WITH OR WITHOUT FEVER  NAUSEA AND VOMITING THAT IS NOT CONTROLLED WITH YOUR NAUSEA MEDICATION  *UNUSUAL SHORTNESS OF BREATH  *UNUSUAL BRUISING OR BLEEDING  TENDERNESS IN MOUTH AND THROAT WITH OR WITHOUT PRESENCE OF ULCERS  *URINARY PROBLEMS  *BOWEL PROBLEMS  UNUSUAL RASH Items with * indicate a potential emergency and should be followed up as soon as possible.  Feel free to call the clinic should you have any questions or concerns. The clinic phone number is [phone].  Please show the [facility] at check-in to the Emergency Department and triage nurse.

## 2017-12-14 ENCOUNTER — Other Ambulatory Visit: Payer: BC Managed Care – PPO

## 2017-12-14 ENCOUNTER — Ambulatory Visit: Payer: BC Managed Care – PPO

## 2017-12-15 ENCOUNTER — Other Ambulatory Visit: Payer: Self-pay | Admitting: *Deleted

## 2017-12-15 ENCOUNTER — Inpatient Hospital Stay: Payer: BC Managed Care – PPO

## 2017-12-15 DIAGNOSIS — R309 Painful micturition, unspecified: Secondary | ICD-10-CM

## 2017-12-15 DIAGNOSIS — C50211 Malignant neoplasm of upper-inner quadrant of right female breast: Secondary | ICD-10-CM | POA: Diagnosis not present

## 2017-12-15 LAB — URINALYSIS, COMPLETE (UACMP) WITH MICROSCOPIC
BACTERIA UA: NONE SEEN
BILIRUBIN URINE: NEGATIVE
Glucose, UA: NEGATIVE mg/dL
KETONES UR: NEGATIVE mg/dL
NITRITE: NEGATIVE
PH: 6 (ref 5.0–8.0)
Protein, ur: NEGATIVE mg/dL
SPECIFIC GRAVITY, URINE: 1.009 (ref 1.005–1.030)
SQUAMOUS EPITHELIAL / LPF: NONE SEEN

## 2017-12-15 NOTE — Progress Notes (Signed)
Complete Physical  Assessment and Plan:  Medication management -     Magnesium  Dense breast tissue  Malignant neoplasm of upper-inner quadrant of right breast in female, estrogen receptor positive (HCC)  Vitamin D deficiency -     VITAMIN D 25 Hydroxy (Vit-D Deficiency, Fractures)  Genetic testing Negative  Encounter for general adult medical examination with abnormal findings  Screening cholesterol level -     Lipid panel  Anemia, unspecified type -     Iron,Total/Total Iron Binding Cap -     Zinc  BMI 24.0-24.9, adult  Fatigue, unspecified type -     TSH -     Zinc    Discussed med's effects and SE's. Screening labs and tests as requested with regular follow-up as recommended.  HPI 47 y.o. female  presents for a complete physical. Patient was married 2017, would like to try to have children, had recent invasive right breast cancer diagnosis Dec 2018, grade 2 triple positive, on Taxol weekly and Herceptin every 3 weeks- will end Feb 2020, following with Dr. Griffith Citron. She is working out, she is trying to eat less sugar, she complains of rash on her back and fatigue, she is following with oncology ans has seen them for this.   Her blood pressure has been controlled at home, today their BP is BP: 130/78  Patient is on Vitamin D supplement.  Lab Results  Component Value Date   VD25OH 53 12/16/2016   Last cholesterol was normal Lab Results  Component Value Date   CHOL 150 12/16/2016   HDL 53 12/16/2016   LDLCALC 79 12/16/2016   TRIG 88 12/16/2016   CHOLHDL 2.8 12/16/2016   Last A1C was normal Lab Results  Component Value Date   HGBA1C 4.6 12/16/2016   BMI is Body mass index is 24.73 kg/m., she is working on diet and exercise. Wt Readings from Last 10 Encounters:  12/20/17 153 lb 3.2 oz (69.5 kg)  12/17/17 150 lb 4.8 oz (68.2 kg)  12/09/17 151 lb (68.5 kg)  12/06/17 152 lb (68.9 kg)  12/03/17 148 lb 6.4 oz (67.3 kg)  11/19/17 152 lb 4.8 oz (69.1 kg)   11/12/17 151 lb 12.8 oz (68.9 kg)  10/27/17 153 lb 11.2 oz (69.7 kg)  09/17/17 164 lb 4.8 oz (74.5 kg)  09/01/17 166 lb 1.6 oz (75.3 kg)    Current Medications:  Current Outpatient Medications on File Prior to Visit  Medication Sig  . b complex vitamins tablet Take 1 tablet by mouth daily.  . Cholecalciferol (VITAMIN D) 2000 UNITS tablet Take 20,000 Units by mouth daily.   . Collagenase POWD by Does not apply route.  Marland Kitchen ECHINACEA PO Take by mouth.  . Ferrous Sulfate (IRON) 325 (65 Fe) MG TABS Take by mouth daily.  . fish oil-omega-3 fatty acids 1000 MG capsule Take 1 g by mouth daily.  Nyoka Cowden Tea 250 MG CAPS Take by mouth 2 (two) times daily.  . Iodine Strong, Lugols, (STRONG IODINE) 5 % solution Take 0.2 mLs by mouth 3 (three) times daily.  Marland Kitchen ketoconazole (NIZORAL) 2 % cream Apply 1 application topically daily.  Marland Kitchen lidocaine-prilocaine (EMLA) cream Apply to affected area once  . magic mouthwash w/lidocaine SOLN Take 5 mLs by mouth 4 (four) times daily as needed for mouth pain. Swish and spit  . OVER THE COUNTER MEDICATION   . prochlorperazine (COMPAZINE) 10 MG tablet Take 1 tablet (10 mg total) by mouth every 6 (six) hours as needed (Nausea  or vomiting).  . triamcinolone ointment (KENALOG) 0.1 % Apply 1 application topically 2 (two) times daily.  . TURMERIC PO Take by mouth.  Marland Kitchen UNABLE TO FIND [place] Tail mushroom 500 mg  . UNABLE TO FIND [place] Mushroom 500 mg daily  . UNABLE TO FIND CBD Oil; patient takes for nausea  . vitamin C (ASCORBIC ACID) 500 MG tablet Take 500 mg by mouth 2 (two) times daily.  . Zinc Acetate 50 MG CAPS Take by mouth.  . ondansetron (ZOFRAN ODT) 8 MG disintegrating tablet 7m ODT q6 hours prn nausea (Patient not taking: Reported on 12/20/2017)  . ondansetron (ZOFRAN) 8 MG tablet Take 1 tablet (8 mg total) by mouth 2 (two) times daily as needed (Nausea or vomiting). (Patient not taking: Reported on 12/20/2017)   No current facility-administered medications on  file prior to visit.    Health Maintenance:  Immunization History  Administered Date(s) Administered  . Hepatitis A 10/02/1997  . Hepatitis B 12/04/1997  . Td 10/10/2008   Tetanus: 2010- due next year Pneumovax: N/A Flu vaccine: N/A Zostavax: N/A Pap: 2016, never normal pap, negative HPV, due 5 years 2021 MGM: 07/2016 s/p lumpectomy and chemo, had breast reduction.  DEXA: N/A Colonoscopy: due age [id number]: N/A Echo 11/2017 Ct 04/2017  Medical History:  Past Medical History:  Diagnosis Date  . [place]   . Family history of ovarian cancer 09/02/2017  . Family history of prostate cancer in father 09/02/2017  . Family history of uterine cancer 09/02/2017  . Plantar fasciitis     Allergies Allergies  Allergen Reactions  . Adhesive [Tape]   . Milk-Related Compounds Diarrhea    Stomach cramping  . Poison Ivy Extract [Poison Ivy Extract] Hives, Itching and Rash    SURGICAL HISTORY She  has no past surgical history on file. FAMILY HISTORY Her family history includes Basal cell carcinoma in her brother; Hydrocephalus in her cousin; Other in her sister; Ovarian cancer (age of onset: 511 in her mother; Prostate cancer (age of onset: 767 in her father; Skin cancer in her maternal grandmother and paternal uncle; Stroke (age of onset: 646 in her maternal aunt and paternal grandfather; Ulcerative colitis in her sister; Uterine cancer (age of onset: 538 in her maternal aunt. SOCIAL HISTORY She  reports that she has never smoked. She has never used smokeless tobacco. She reports that she drinks alcohol. She reports that she does not use drugs.  Review of Systems  Constitutional: Negative.   HENT: Negative.   Eyes: Negative.   Respiratory: Negative.   Cardiovascular: Negative.   Gastrointestinal: Negative.   Genitourinary: Negative.   Musculoskeletal: Positive for back pain. Negative for falls, joint pain, myalgias and neck pain.  Skin: Negative.   Neurological: Negative.    Endo/Heme/Allergies: Negative.   Psychiatric/Behavioral: Negative.      Physical Exam: Estimated body mass index is 24.73 kg/m as calculated from the following:   Height as of this encounter: 5' 6"  (1.676 m).   Weight as of this encounter: 153 lb 3.2 oz (69.5 kg). Vitals:   12/20/17 1353  BP: 130/78  Pulse: (!) 101  Resp: 16  Temp: (!) 97.5 F (36.4 C)  SpO2: 93%   General Appearance: Well nourished, in no apparent distress. Eyes: PERRLA, EOMs, conjunctiva no swelling or erythema, normal fundi and vessels. Sinuses: No Frontal/maxillary tenderness ENT/Mouth: Ext aud canals clear, normal light reflex with TMs without erythema, bulging.  Good dentition. No erythema, swelling, or exudate on post pharynx.  Tonsils not swollen or erythematous. Hearing normal.  Neck: Supple, thyroid normal. No bruits Respiratory: Respiratory effort normal, BS equal bilaterally without rales, rhonchi, wheezing or stridor. Cardio: RRR without murmurs, rubs or gallops. Brisk peripheral pulses without edema.  Chest: symmetric, with normal excursions and percussion. Breasts: Symmetric, very dense breast, without nipple discharge, retractions. Abdomen: Soft, +BS. Non tender, no guarding, rebound, hernias, masses, or organomegaly. Lymphatics: Non tender without lymphadenopathy.  Genitourinary: defer Musculoskeletal: Full ROM all peripheral extremities,5/5 strength, and normal gait. Skin: Warm, dry without rashes, lesions, ecchymosis.  Neuro: Cranial nerves intact, reflexes equal bilaterally. Normal muscle tone, no cerebellar symptoms. Sensation intact.  Psych: Awake and oriented X 3, normal affect, Insight and Judgment appropriate.   EKG: defer  Vicie Mutters 2:07 PM

## 2017-12-16 ENCOUNTER — Telehealth: Payer: Self-pay | Admitting: *Deleted

## 2017-12-16 LAB — URINE CULTURE

## 2017-12-16 MED ORDER — CIPROFLOXACIN HCL 500 MG PO TABS: 500.0000 mg | ORAL_TABLET | Freq: Two times a day (BID) | ORAL | 0 refills | Status: DC

## 2017-12-16 MED ORDER — MAGIC MOUTHWASH W/LIDOCAINE: 5.0000 mL | Freq: Four times a day (QID) | ORAL | 1 refills | Status: DC | PRN

## 2017-12-16 NOTE — Telephone Encounter (Signed)
This RN spoke with pt per results of UA - with MD recommendation for cipro for 3 days.  Of note Hannah Woodard states she has also noted area of jaw " soreness " occurring post " a scab fell off where I had my wisdom tooth removed and now it kinda has like a pit in there " " do I just show that to Dr Jana Hakim or should I call the dentist ?"  [name] had her wisdom tooth removed early Feb.  This RN informed pt she should contact the dentist to discuss with him but that this office can call in a prescription for MMW which she may find beneficial in addition to her currently rinsing with salt water.  No other current needs - she is scheduled to see MD tomorrow prior to treatment.  Prescriptions sent to verified pharmacy.

## 2017-12-17 ENCOUNTER — Inpatient Hospital Stay (HOSPITAL_BASED_OUTPATIENT_CLINIC_OR_DEPARTMENT_OTHER): Payer: BC Managed Care – PPO | Admitting: Oncology

## 2017-12-17 ENCOUNTER — Inpatient Hospital Stay: Payer: BC Managed Care – PPO

## 2017-12-17 ENCOUNTER — Other Ambulatory Visit: Payer: BC Managed Care – PPO

## 2017-12-17 ENCOUNTER — Encounter: Payer: Self-pay | Admitting: *Deleted

## 2017-12-17 VITALS — BP 131/69 | HR 78 | Temp 97.8°F | Resp 18 | Ht 66.0 in | Wt 150.3 lb

## 2017-12-17 DIAGNOSIS — R5383 Other fatigue: Secondary | ICD-10-CM | POA: Diagnosis not present

## 2017-12-17 DIAGNOSIS — C50211 Malignant neoplasm of upper-inner quadrant of right female breast: Secondary | ICD-10-CM

## 2017-12-17 DIAGNOSIS — F418 Other specified anxiety disorders: Secondary | ICD-10-CM

## 2017-12-17 DIAGNOSIS — Z3184 Encounter for fertility preservation procedure: Secondary | ICD-10-CM | POA: Diagnosis not present

## 2017-12-17 DIAGNOSIS — Z170 Estrogen receptor positive status [ER+]: Secondary | ICD-10-CM | POA: Diagnosis not present

## 2017-12-17 DIAGNOSIS — Z79818 Long term (current) use of other agents affecting estrogen receptors and estrogen levels: Secondary | ICD-10-CM

## 2017-12-17 DIAGNOSIS — M2550 Pain in unspecified joint: Secondary | ICD-10-CM

## 2017-12-17 DIAGNOSIS — R21 Rash and other nonspecific skin eruption: Secondary | ICD-10-CM | POA: Diagnosis not present

## 2017-12-17 DIAGNOSIS — Z808 Family history of malignant neoplasm of other organs or systems: Secondary | ICD-10-CM

## 2017-12-17 DIAGNOSIS — Z79899 Other long term (current) drug therapy: Secondary | ICD-10-CM | POA: Diagnosis not present

## 2017-12-17 DIAGNOSIS — Z8041 Family history of malignant neoplasm of ovary: Secondary | ICD-10-CM | POA: Diagnosis not present

## 2017-12-17 DIAGNOSIS — Z8042 Family history of malignant neoplasm of prostate: Secondary | ICD-10-CM

## 2017-12-17 DIAGNOSIS — Z95828 Presence of other vascular implants and grafts: Secondary | ICD-10-CM

## 2017-12-17 LAB — CMP (CANCER CENTER ONLY)
ALBUMIN: 3.8 g/dL (ref 3.5–5.0)
ALT: 27 U/L (ref 0–55)
ANION GAP: 8 (ref 3–11)
AST: 17 U/L (ref 5–34)
Alkaline Phosphatase: 52 U/L (ref 40–150)
BILIRUBIN TOTAL: 0.6 mg/dL (ref 0.2–1.2)
BUN: 12 mg/dL (ref 7–26)
CO2: 28 mmol/L (ref 22–29)
Calcium: 9.3 mg/dL (ref 8.4–10.4)
Chloride: 106 mmol/L (ref 98–109)
Creatinine: 0.66 mg/dL (ref 0.60–1.10)
GFR, Estimated: >60 mL/min (ref >60)
GLUCOSE: 119 mg/dL (ref 70–140)
POTASSIUM: 3.6 mmol/L (ref 3.5–5.1)
Sodium: 142 mmol/L (ref 136–145)
Total Protein: 6.5 g/dL (ref 6.4–8.3)

## 2017-12-17 LAB — CBC WITH DIFFERENTIAL (CANCER CENTER ONLY)
BASOS ABS: 0 10*3/uL (ref 0.0–0.1)
Basophils Relative: 1 %
Eosinophils Absolute: 0.2 10*3/uL (ref 0.0–0.5)
Eosinophils Relative: 4 %
HEMATOCRIT: 32.8 % — AB (ref 34.8–46.6)
HEMOGLOBIN: 11.3 g/dL — AB (ref 11.6–15.9)
LYMPHS PCT: 19 %
Lymphs Abs: 0.9 10*3/uL (ref 0.9–3.3)
MCH: 34 pg (ref 25.1–34.0)
MCHC: 34.5 g/dL (ref 31.5–36.0)
MCV: 98.4 fL (ref 79.5–101.0)
MONO ABS: 0.3 10*3/uL (ref 0.1–0.9)
Monocytes Relative: 6 %
NEUTROS ABS: 3.4 10*3/uL (ref 1.5–6.5)
NEUTROS PCT: 70 %
Platelet Count: 306 10*3/uL (ref 145–400)
RBC: 3.34 MIL/uL — AB (ref 3.70–5.45)
RDW: 14.7 % — ABNORMAL HIGH (ref 11.2–14.5)
WBC: 4.9 10*3/uL (ref 3.9–10.3)

## 2017-12-17 MED ORDER — SODIUM CHLORIDE 0.9% FLUSH
10.0000 mL | Freq: Once | INTRAVENOUS | Status: AC
  Administered 2017-12-17: 10 mL
  Filled 2017-12-17: qty 10

## 2017-12-17 MED ORDER — SODIUM CHLORIDE 0.9 % IV SOLN
80.0000 mg/m2 | Freq: Once | INTRAVENOUS | Status: AC
  Administered 2017-12-17: 150 mg via INTRAVENOUS
  Filled 2017-12-17: qty 25

## 2017-12-17 MED ORDER — DIPHENHYDRAMINE HCL 50 MG/ML IJ SOLN
INTRAMUSCULAR | Status: AC
  Filled 2017-12-17: qty 1

## 2017-12-17 MED ORDER — SODIUM CHLORIDE 0.9 % IV SOLN
Freq: Once | INTRAVENOUS | Status: AC
  Administered 2017-12-17: 12:00:00 via INTRAVENOUS

## 2017-12-17 MED ORDER — SODIUM CHLORIDE 0.9 % IV SOLN
10.0000 mg | Freq: Once | INTRAVENOUS | Status: AC
  Administered 2017-12-17: 10 mg via INTRAVENOUS
  Filled 2017-12-17: qty 1

## 2017-12-17 MED ORDER — DIPHENHYDRAMINE HCL 50 MG/ML IJ SOLN
25.0000 mg | Freq: Once | INTRAMUSCULAR | Status: AC
  Administered 2017-12-17: 25 mg via INTRAVENOUS

## 2017-12-17 MED ORDER — FAMOTIDINE IN NACL 20-0.9 MG/50ML-% IV SOLN
INTRAVENOUS | Status: AC
  Filled 2017-12-17: qty 50

## 2017-12-17 MED ORDER — MAGIC MOUTHWASH W/LIDOCAINE: 5.0000 mL | Freq: Four times a day (QID) | ORAL | 1 refills | Status: DC | PRN

## 2017-12-17 MED ORDER — HEPARIN SOD (PORK) LOCK FLUSH 100 UNIT/ML IV SOLN
500.0000 [IU] | Freq: Once | INTRAVENOUS | Status: AC | PRN
  Administered 2017-12-17: 500 [IU]
  Filled 2017-12-17: qty 5

## 2017-12-17 MED ORDER — SODIUM CHLORIDE 0.9% FLUSH
10.0000 mL | INTRAVENOUS | Status: DC | PRN
  Administered 2017-12-17: 10 mL
  Filled 2017-12-17: qty 10

## 2017-12-17 MED ORDER — FAMOTIDINE IN NACL 20-0.9 MG/50ML-% IV SOLN
20.0000 mg | Freq: Once | INTRAVENOUS | Status: AC
  Administered 2017-12-17: 20 mg via INTRAVENOUS

## 2017-12-17 MED ORDER — KETOCONAZOLE 2 % EX CREA: 1.0000 "application " | TOPICAL_CREAM | Freq: Every day | CUTANEOUS | 0 refills | Status: DC

## 2017-12-17 MED ORDER — DEXAMETHASONE SODIUM PHOSPHATE 10 MG/ML IJ SOLN
INTRAMUSCULAR | Status: AC
  Filled 2017-12-17: qty 1

## 2017-12-17 NOTE — Patient Instructions (Signed)
[facility] Discharge Instructions for Patients Receiving Chemotherapy  Today you received the following chemotherapy agents:  Taxol.  To help prevent nausea and vomiting after your treatment, we encourage you to take your nausea medication as directed.   If you develop nausea and vomiting that is not controlled by your nausea medication, call the clinic.   BELOW ARE SYMPTOMS THAT SHOULD BE REPORTED IMMEDIATELY:  *FEVER GREATER THAN 100.5 F  *CHILLS WITH OR WITHOUT FEVER  NAUSEA AND VOMITING THAT IS NOT CONTROLLED WITH YOUR NAUSEA MEDICATION  *UNUSUAL SHORTNESS OF BREATH  *UNUSUAL BRUISING OR BLEEDING  TENDERNESS IN MOUTH AND THROAT WITH OR WITHOUT PRESENCE OF ULCERS  *URINARY PROBLEMS  *BOWEL PROBLEMS  UNUSUAL RASH Items with * indicate a potential emergency and should be followed up as soon as possible.  Feel free to call the clinic should you have any questions or concerns. The clinic phone number is [phone].  Please show the [facility] at check-in to the Emergency Department and triage nurse.

## 2017-12-17 NOTE — Addendum Note (Signed)
Addended by: Chauncey Cruel on: 12/17/2017 11:19 AM   Modules accepted: Orders

## 2017-12-19 ENCOUNTER — Encounter: Payer: Self-pay | Admitting: Physician Assistant

## 2017-12-20 ENCOUNTER — Ambulatory Visit (INDEPENDENT_AMBULATORY_CARE_PROVIDER_SITE_OTHER): Payer: BC Managed Care – PPO | Admitting: Physician Assistant

## 2017-12-20 ENCOUNTER — Encounter: Payer: Self-pay | Admitting: Physician Assistant

## 2017-12-20 VITALS — BP 130/78 | HR 101 | Temp 97.5°F | Resp 16 | Ht 66.0 in | Wt 153.2 lb

## 2017-12-20 DIAGNOSIS — Z0000 Encounter for general adult medical examination without abnormal findings: Secondary | ICD-10-CM

## 2017-12-20 DIAGNOSIS — C50211 Malignant neoplasm of upper-inner quadrant of right female breast: Secondary | ICD-10-CM

## 2017-12-20 DIAGNOSIS — Z13220 Encounter for screening for lipoid disorders: Secondary | ICD-10-CM

## 2017-12-20 DIAGNOSIS — Z1379 Encounter for other screening for genetic and chromosomal anomalies: Secondary | ICD-10-CM

## 2017-12-20 DIAGNOSIS — E559 Vitamin D deficiency, unspecified: Secondary | ICD-10-CM | POA: Diagnosis not present

## 2017-12-20 DIAGNOSIS — R5383 Other fatigue: Secondary | ICD-10-CM

## 2017-12-20 DIAGNOSIS — Z79899 Other long term (current) drug therapy: Secondary | ICD-10-CM | POA: Diagnosis not present

## 2017-12-20 DIAGNOSIS — Z1329 Encounter for screening for other suspected endocrine disorder: Secondary | ICD-10-CM | POA: Diagnosis not present

## 2017-12-20 DIAGNOSIS — R922 Inconclusive mammogram: Secondary | ICD-10-CM

## 2017-12-20 DIAGNOSIS — D649 Anemia, unspecified: Secondary | ICD-10-CM | POA: Diagnosis not present

## 2017-12-20 DIAGNOSIS — Z170 Estrogen receptor positive status [ER+]: Secondary | ICD-10-CM

## 2017-12-20 DIAGNOSIS — Z0001 Encounter for general adult medical examination with abnormal findings: Secondary | ICD-10-CM

## 2017-12-20 DIAGNOSIS — Z6824 Body mass index (BMI) 24.0-24.9, adult: Secondary | ICD-10-CM

## 2017-12-20 NOTE — Patient Instructions (Signed)
Continue vitamin D Calcium only do 400-621m daily if any   Magnesium may help with muscle cramps, constipation, vitamin D and potassium absorption.     Fatigue Fatigue is feeling tired all of the time, a lack of energy, or a lack of motivation. Occasional or mild fatigue is often a normal response to activity or life in general. However, long-lasting (chronic) or extreme fatigue may indicate an underlying medical condition. Follow these instructions at home: Watch your fatigue for any changes. The following actions may help to lessen any discomfort you are feeling:  Talk to your health care provider about how much sleep you need each night. Try to get the required amount every night.  Take medicines only as directed by your health care provider.  Eat a healthy and nutritious diet. Ask your health care provider if you need help changing your diet.  Drink enough fluid to keep your urine clear or pale yellow.  Practice ways of relaxing, such as yoga, meditation, massage therapy, or acupuncture.  Exercise regularly.  Change situations that cause you stress. Try to keep your work and personal routine reasonable.  Contact a health care provider if:  Your fatigue does not get better.  You have a fever.  You have unintentional weight loss or gain.  You have headaches.  You have difficulty: ? Falling asleep. ? Sleeping throughout the night.  You feel angry, guilty, anxious, or sad.  You are unable to have a bowel movement (constipation).  You skin is dry.  Your legs or another part of your body is swollen. Get help right away if:  You feel confused.  Your vision is blurry.  You feel faint or pass out.  You have a severe headache.  You have severe abdominal, pelvic, or back pain.  You have chest pain, shortness of breath, or an irregular or fast heartbeat.  You are unable to urinate or you urinate less than normal.  You develop abnormal bleeding, such as bleeding  from the rectum, vagina, nose, lungs, or nipples.  You vomit blood.  You have thoughts about harming yourself or committing suicide.  You are worried that you might harm someone else. This information is not intended to replace advice given to you by your health care provider. Make sure you discuss any questions you have with your health care provider. Document Released: 07/12/2007 Document Revised: 02/20/2016 Document Reviewed: 01/16/2014 Elsevier Interactive Patient Education  2[employer]

## 2017-12-21 ENCOUNTER — Other Ambulatory Visit: Payer: BC Managed Care – PPO

## 2017-12-21 ENCOUNTER — Ambulatory Visit: Payer: BC Managed Care – PPO

## 2017-12-22 LAB — VITAMIN D 25 HYDROXY (VIT D DEFICIENCY, FRACTURES): VIT D 25 HYDROXY: 105 ng/mL — AB (ref 30–100)

## 2017-12-22 LAB — LIPID PANEL
Cholesterol: 152 mg/dL (ref <200)
HDL: 42 mg/dL — ABNORMAL LOW (ref >50)
LDL Cholesterol (Calc): 93 mg/dL (calc)
Non-HDL Cholesterol (Calc): 110 mg/dL (calc) (ref <130)
Total CHOL/HDL Ratio: 3.6 (calc) (ref <5.0)
Triglycerides: 81 mg/dL (ref <150)

## 2017-12-22 LAB — MAGNESIUM: MAGNESIUM: 2.1 mg/dL (ref 1.5–2.5)

## 2017-12-22 LAB — IRON, TOTAL/TOTAL IRON BINDING CAP
%SAT: 43 % (calc) (ref 11–50)
IRON: 132 ug/dL (ref 40–190)
TIBC: 305 mcg/dL (calc) (ref 250–450)

## 2017-12-22 LAB — TSH: TSH: 1.46 m[IU]/L

## 2017-12-22 LAB — ZINC: ZINC: 86 ug/dL (ref 60–130)

## 2017-12-24 ENCOUNTER — Inpatient Hospital Stay: Payer: BC Managed Care – PPO

## 2017-12-24 ENCOUNTER — Encounter: Payer: Self-pay | Admitting: *Deleted

## 2017-12-24 VITALS — BP 135/65 | HR 81 | Temp 98.1°F | Resp 16

## 2017-12-24 DIAGNOSIS — Z170 Estrogen receptor positive status [ER+]: Principal | ICD-10-CM

## 2017-12-24 DIAGNOSIS — C50211 Malignant neoplasm of upper-inner quadrant of right female breast: Secondary | ICD-10-CM

## 2017-12-24 DIAGNOSIS — Z95828 Presence of other vascular implants and grafts: Secondary | ICD-10-CM

## 2017-12-24 LAB — CBC WITH DIFFERENTIAL (CANCER CENTER ONLY)
BASOS PCT: 1 %
Basophils Absolute: 0 10*3/uL (ref 0.0–0.1)
EOS ABS: 0.2 10*3/uL (ref 0.0–0.5)
EOS PCT: 4 %
HCT: 33.1 % — ABNORMAL LOW (ref 34.8–46.6)
Hemoglobin: 11.4 g/dL — ABNORMAL LOW (ref 11.6–15.9)
LYMPHS ABS: 1 10*3/uL (ref 0.9–3.3)
Lymphocytes Relative: 20 %
MCH: 34.1 pg — AB (ref 25.1–34.0)
MCHC: 34.5 g/dL (ref 31.5–36.0)
MCV: 98.8 fL (ref 79.5–101.0)
Monocytes Absolute: 0.4 10*3/uL (ref 0.1–0.9)
Monocytes Relative: 8 %
Neutro Abs: 3.4 10*3/uL (ref 1.5–6.5)
Neutrophils Relative %: 67 %
PLATELETS: 301 10*3/uL (ref 145–400)
RBC: 3.35 MIL/uL — AB (ref 3.70–5.45)
RDW: 15.2 % — ABNORMAL HIGH (ref 11.2–14.5)
WBC Count: 5.1 10*3/uL (ref 3.9–10.3)

## 2017-12-24 LAB — CMP (CANCER CENTER ONLY)
ALK PHOS: 53 U/L (ref 40–150)
ALT: 24 U/L (ref 0–55)
AST: 17 U/L (ref 5–34)
Albumin: 3.8 g/dL (ref 3.5–5.0)
Anion gap: 7 (ref 3–11)
BILIRUBIN TOTAL: 0.5 mg/dL (ref 0.2–1.2)
BUN: 12 mg/dL (ref 7–26)
CALCIUM: 9.4 mg/dL (ref 8.4–10.4)
CHLORIDE: 106 mmol/L (ref 98–109)
CO2: 27 mmol/L (ref 22–29)
CREATININE: 0.62 mg/dL (ref 0.60–1.10)
GFR, Est AFR Am: >60 mL/min (ref >60)
GFR, Estimated: >60 mL/min (ref >60)
Glucose, Bld: 93 mg/dL (ref 70–140)
Potassium: 4 mmol/L (ref 3.5–5.1)
Sodium: 140 mmol/L (ref 136–145)
Total Protein: 6.4 g/dL (ref 6.4–8.3)

## 2017-12-24 MED ORDER — SODIUM CHLORIDE 0.9 % IV SOLN
80.0000 mg/m2 | Freq: Once | INTRAVENOUS | Status: AC
  Administered 2017-12-24: 150 mg via INTRAVENOUS
  Filled 2017-12-24: qty 25

## 2017-12-24 MED ORDER — DIPHENHYDRAMINE HCL 50 MG/ML IJ SOLN
25.0000 mg | Freq: Once | INTRAMUSCULAR | Status: AC
  Administered 2017-12-24: 25 mg via INTRAVENOUS

## 2017-12-24 MED ORDER — GOSERELIN ACETATE 3.6 MG ~~LOC~~ IMPL
3.6000 mg | DRUG_IMPLANT | Freq: Once | SUBCUTANEOUS | Status: AC
  Administered 2017-12-24: 3.6 mg via SUBCUTANEOUS

## 2017-12-24 MED ORDER — SODIUM CHLORIDE 0.9 % IV SOLN
10.0000 mg | Freq: Once | INTRAVENOUS | Status: AC
  Administered 2017-12-24: 10 mg via INTRAVENOUS
  Filled 2017-12-24: qty 1

## 2017-12-24 MED ORDER — SODIUM CHLORIDE 0.9 % IV SOLN
Freq: Once | INTRAVENOUS | Status: AC
  Administered 2017-12-24: 11:00:00 via INTRAVENOUS

## 2017-12-24 MED ORDER — HEPARIN SOD (PORK) LOCK FLUSH 100 UNIT/ML IV SOLN
500.0000 [IU] | Freq: Once | INTRAVENOUS | Status: AC | PRN
  Administered 2017-12-24: 500 [IU]
  Filled 2017-12-24: qty 5

## 2017-12-24 MED ORDER — ACETAMINOPHEN 325 MG PO TABS
650.0000 mg | ORAL_TABLET | Freq: Once | ORAL | Status: AC
  Administered 2017-12-24: 650 mg via ORAL

## 2017-12-24 MED ORDER — DIPHENHYDRAMINE HCL 25 MG PO CAPS: 25.0000 mg | ORAL_CAPSULE | Freq: Once | ORAL | Status: DC

## 2017-12-24 MED ORDER — TRASTUZUMAB CHEMO 150 MG IV SOLR
450.0000 mg | Freq: Once | INTRAVENOUS | Status: AC
  Administered 2017-12-24: 450 mg via INTRAVENOUS
  Filled 2017-12-24: qty 21.43

## 2017-12-24 MED ORDER — DEXAMETHASONE SODIUM PHOSPHATE 10 MG/ML IJ SOLN
INTRAMUSCULAR | Status: AC
  Filled 2017-12-24: qty 1

## 2017-12-24 MED ORDER — SODIUM CHLORIDE 0.9% FLUSH
10.0000 mL | Freq: Once | INTRAVENOUS | Status: AC
  Administered 2017-12-24: 10 mL
  Filled 2017-12-24: qty 10

## 2017-12-24 MED ORDER — ACETAMINOPHEN 325 MG PO TABS
ORAL_TABLET | ORAL | Status: AC
  Filled 2017-12-24: qty 2

## 2017-12-24 MED ORDER — DIPHENHYDRAMINE HCL 50 MG/ML IJ SOLN
INTRAMUSCULAR | Status: AC
  Filled 2017-12-24: qty 1

## 2017-12-24 MED ORDER — FAMOTIDINE IN NACL 20-0.9 MG/50ML-% IV SOLN
20.0000 mg | Freq: Once | INTRAVENOUS | Status: AC
  Administered 2017-12-24: 20 mg via INTRAVENOUS

## 2017-12-24 MED ORDER — FAMOTIDINE IN NACL 20-0.9 MG/50ML-% IV SOLN
INTRAVENOUS | Status: AC
  Filled 2017-12-24: qty 50

## 2017-12-24 MED ORDER — GOSERELIN ACETATE 3.6 MG ~~LOC~~ IMPL
DRUG_IMPLANT | SUBCUTANEOUS | Status: AC
  Filled 2017-12-24: qty 3.6

## 2017-12-24 MED ORDER — SODIUM CHLORIDE 0.9% FLUSH
10.0000 mL | INTRAVENOUS | Status: DC | PRN
  Administered 2017-12-24: 10 mL
  Filled 2017-12-24: qty 10

## 2017-12-24 NOTE — Patient Instructions (Signed)
[facility] Discharge Instructions for Patients Receiving Chemotherapy  Today you received the following chemotherapy agents:  Herceptin and Taxol.  To help prevent nausea and vomiting after your treatment, we encourage you to take your nausea medication as directed.   If you develop nausea and vomiting that is not controlled by your nausea medication, call the clinic.   BELOW ARE SYMPTOMS THAT SHOULD BE REPORTED IMMEDIATELY:  *FEVER GREATER THAN 100.5 F  *CHILLS WITH OR WITHOUT FEVER  NAUSEA AND VOMITING THAT IS NOT CONTROLLED WITH YOUR NAUSEA MEDICATION  *UNUSUAL SHORTNESS OF BREATH  *UNUSUAL BRUISING OR BLEEDING  TENDERNESS IN MOUTH AND THROAT WITH OR WITHOUT PRESENCE OF ULCERS  *URINARY PROBLEMS  *BOWEL PROBLEMS  UNUSUAL RASH Items with * indicate a potential emergency and should be followed up as soon as possible.  Feel free to call the clinic should you have any questions or concerns. The clinic phone number is [phone].  Please show the [facility] at check-in to the Emergency Department and triage nurse.

## 2017-12-26 ENCOUNTER — Encounter (HOSPITAL_COMMUNITY): Payer: Self-pay | Admitting: Nurse Practitioner

## 2017-12-26 ENCOUNTER — Emergency Department (HOSPITAL_COMMUNITY): Payer: BC Managed Care – PPO

## 2017-12-26 ENCOUNTER — Emergency Department (HOSPITAL_COMMUNITY)
Admission: EM | Admit: 2017-12-26 | Discharge: 2017-12-26 | Disposition: A | Discharge status: Home / Self Care | Payer: BC Managed Care – PPO | Attending: Emergency Medicine | Admitting: Emergency Medicine

## 2017-12-26 DIAGNOSIS — R0981 Nasal congestion: Secondary | ICD-10-CM | POA: Insufficient documentation

## 2017-12-26 DIAGNOSIS — R070 Pain in throat: Secondary | ICD-10-CM | POA: Insufficient documentation

## 2017-12-26 DIAGNOSIS — Z79899 Other long term (current) drug therapy: Secondary | ICD-10-CM | POA: Diagnosis not present

## 2017-12-26 DIAGNOSIS — Z853 Personal history of malignant neoplasm of breast: Secondary | ICD-10-CM | POA: Diagnosis not present

## 2017-12-26 DIAGNOSIS — J029 Acute pharyngitis, unspecified: Secondary | ICD-10-CM

## 2017-12-26 DIAGNOSIS — R05 Cough: Secondary | ICD-10-CM | POA: Insufficient documentation

## 2017-12-26 DIAGNOSIS — Z9221 Personal history of antineoplastic chemotherapy: Secondary | ICD-10-CM | POA: Diagnosis not present

## 2017-12-26 LAB — COMPREHENSIVE METABOLIC PANEL
ALBUMIN: 4.2 g/dL (ref 3.5–5.0)
ALT: 27 U/L (ref 14–54)
AST: 21 U/L (ref 15–41)
Alkaline Phosphatase: 49 U/L (ref 38–126)
Anion gap: 10 (ref 5–15)
BUN: 14 mg/dL (ref 6–20)
CHLORIDE: 103 mmol/L (ref 101–111)
CO2: 28 mmol/L (ref 22–32)
Calcium: 9.2 mg/dL (ref 8.9–10.3)
Creatinine, Ser: 0.5 mg/dL (ref 0.44–1.00)
GFR calc Af Amer: >60 mL/min (ref >60)
GFR calc non Af Amer: >60 mL/min (ref >60)
GLUCOSE: 98 mg/dL (ref 65–99)
POTASSIUM: 3.6 mmol/L (ref 3.5–5.1)
SODIUM: 141 mmol/L (ref 135–145)
TOTAL PROTEIN: 6.8 g/dL (ref 6.5–8.1)
Total Bilirubin: 0.8 mg/dL (ref 0.3–1.2)

## 2017-12-26 LAB — CBC WITH DIFFERENTIAL/PLATELET
BASOS ABS: 0 10*3/uL (ref 0.0–0.1)
Basophils Relative: 0 %
EOS PCT: 1 %
Eosinophils Absolute: 0.1 10*3/uL (ref 0.0–0.7)
HCT: 34.6 % — ABNORMAL LOW (ref 36.0–46.0)
Hemoglobin: 11.6 g/dL — ABNORMAL LOW (ref 12.0–15.0)
LYMPHS PCT: 21 %
Lymphs Abs: 1.3 10*3/uL (ref 0.7–4.0)
MCH: 33 pg (ref 26.0–34.0)
MCHC: 33.5 g/dL (ref 30.0–36.0)
MCV: 98.6 fL (ref 78.0–100.0)
MONO ABS: 0.1 10*3/uL (ref 0.1–1.0)
Monocytes Relative: 2 %
Neutro Abs: 4.7 10*3/uL (ref 1.7–7.7)
Neutrophils Relative %: 76 %
PLATELETS: 276 10*3/uL (ref 150–400)
RBC: 3.51 MIL/uL — AB (ref 3.87–5.11)
RDW: 14.9 % (ref 11.5–15.5)
WBC: 6.2 10*3/uL (ref 4.0–10.5)

## 2017-12-26 LAB — RAPID STREP SCREEN (MED CTR MEBANE ONLY): STREPTOCOCCUS, GROUP A SCREEN (DIRECT): NEGATIVE

## 2017-12-26 NOTE — Discharge Instructions (Addendum)
Today your labs were very reassuring, your white count is normal in all your electrolytes are in normal range.  Your chest x-ray did not show any pneumonia or cause for your cough.  Your rapid strep test was negative, you have a  strep culture pending.  Please follow-up with your oncologist tomorrow morning.  Please return to the emergency room if you develop fevers, chills, nausea vomiting or if any additional concerns.

## 2017-12-26 NOTE — ED Provider Notes (Signed)
[facility] DEPT Provider Note   CSN: [id number] Arrival date & time: 12/26/17  1712     History   Chief Complaint Chief Complaint  Patient presents with  . Sore Throat  . CA Pt    HPI Hannah Woodard is a 47 y.o. female with history of breast cancer actively being treated with chemotherapy, who presents today for evaluation of sore throat and a cough.  She reports that over the past day she has developed worsening pain in her throat, voice hoarseness a cough and feeling congested.  She reports that her cough is occasionally productive and is phlegmy.  She denies any fevers at home, no abnormal nausea vomiting or diarrhea.  No abdominal pain or shortness of breath.  No chest pain.  She reports that she has not had any new medications recently.  No new foods or exposures.  HPI  Past Medical History:  Diagnosis Date  . Breast cancer ([place])   . Family history of ovarian cancer 09/02/2017  . Family history of prostate cancer in father 09/02/2017  . Family history of uterine cancer 09/02/2017  . Plantar fasciitis     Patient Active Problem List   Diagnosis Date Noted  . Port-A-Cath in place 11/12/2017  . Genetic testing 09/15/2017  . Malignant neoplasm of upper-inner quadrant of right breast in female, estrogen receptor positive ([place]) 08/27/2017  . Vitamin D deficiency 12/09/2015  . Medication management 12/09/2015  . Dense breast tissue 12/09/2015    History reviewed. No pertinent surgical history.   OB History   None      Home Medications    Prior to Admission medications   Medication Sig Start Date End Date Taking? Authorizing Provider  Ascorbic Acid (VITAMIN C PO) Take 400 Units by mouth 2 (two) times daily.   Yes [provider]  b complex vitamins tablet Take 1 tablet by mouth daily.   Yes [provider]  Cholecalciferol (VITAMIN D3) 5000 units TABS Take 10,000 Units by mouth daily.   Yes [provider]    fish oil-omega-3 fatty acids 1000 MG capsule Take 1 g by mouth daily.   Yes [provider]  ketoconazole (NIZORAL) 2 % cream Apply 1 application topically daily. Patient taking differently: Apply 1 application topically daily as needed for irritation.  12/17/17  Yes Magrinat, Virgie Dad, MD  lidocaine (XYLOCAINE) 2 % solution Use as directed 20 mLs in the mouth or throat every 6 (six) hours as needed for mouth pain.  12/17/17  Yes [provider]  magic mouthwash w/lidocaine SOLN Take 5 mLs by mouth 4 (four) times daily as needed for mouth pain. Swish and spit 12/17/17  Yes Magrinat, Virgie Dad, MD  OVER THE COUNTER MEDICATION Take 1 tablet by mouth at bedtime as needed (constipation).    Yes [provider]  prochlorperazine (COMPAZINE) 10 MG tablet Take 1 tablet (10 mg total) by mouth every 6 (six) hours as needed (Nausea or vomiting). 10/27/17  Yes Magrinat, Virgie Dad, MD  triamcinolone ointment (KENALOG) 0.1 % Apply 1 application topically 2 (two) times daily. Patient taking differently: Apply 1 application topically 2 (two) times daily as needed (rash).  12/06/17  Yes Liane Comber, NP  UNABLE TO FIND Take 500 mg by mouth daily. [place] mushroom   Yes [provider]  UNABLE TO FIND Take 500 mg by mouth daily. [place] Mushroom   Yes [provider]  Zinc Acetate 50 MG CAPS Take 1 capsule  by mouth every other day.    Yes [provider]  ECHINACEA PO Take 1 capsule by mouth as directed.     [provider]  Iodine Strong, Lugols, (STRONG IODINE) 5 % solution Take 0.2 mLs by mouth 3 (three) times daily. Patient not taking: Reported on 12/26/2017 12/02/16   Vicie Mutters, PA-C  lidocaine-prilocaine (EMLA) cream Apply to affected area once 10/27/17   Magrinat, Virgie Dad, MD  ondansetron (ZOFRAN ODT) 8 MG disintegrating tablet 77m ODT q6 hours prn nausea Patient not taking: Reported on 12/20/2017 03/30/16   Forcucci, CLoma Sousa PA-C   ondansetron (ZOFRAN) 8 MG tablet Take 1 tablet (8 mg total) by mouth 2 (two) times daily as needed (Nausea or vomiting). Patient not taking: Reported on 12/20/2017 10/27/17   Magrinat, GVirgie Dad MD  TURMERIC PO Take 1 capsule by mouth as directed.     [provider]  UNABLE TO FIND Take 5 mg by mouth as directed. CBD Oil; patient takes for nausea     [provider]    Family History Family History  Problem Relation Age of Onset  . Ovarian cancer Mother 532 . Prostate cancer Father 792      'high gleason' unsure number  . Ulcerative colitis Sister   . Other Sister        abn ovaian growth, partial hysterectomy  . Uterine cancer Maternal Aunt 534 . Stroke Maternal Aunt 66  . Basal cell carcinoma Brother   . Skin cancer Paternal Uncle   . Skin cancer Maternal Grandmother   . Stroke Paternal Grandfather 674 . Hydrocephalus Cousin     Social History Social History   Tobacco Use  . Smoking status: Never Smoker  . Smokeless tobacco: Never Used  Substance Use Topics  . Alcohol use: Yes  . Drug use: No     Allergies   Adhesive [tape]; Milk-related compounds; and Poison ivy extract [poison ivy extract]   Review of Systems Review of Systems  Constitutional: Negative for chills and fever.  HENT: Positive for postnasal drip, rhinorrhea, sore throat and voice change. Negative for congestion, sinus pressure, sinus pain and trouble swallowing.   Eyes: Negative for visual disturbance.  Respiratory: Positive for cough. Negative for shortness of breath.   Gastrointestinal: Negative for abdominal pain, diarrhea, nausea and vomiting.  Genitourinary: Negative for dysuria.  Allergic/Immunologic: Positive for immunocompromised state.  Neurological: Negative for light-headedness.  All other systems reviewed and are negative.    Physical Exam Updated Vital Signs BP 114/75 (BP Location: Left Arm)   Pulse 87   Temp 99 F (37.2 C) (Oral)   Resp (!) 23   Ht 5' 6"   (1.676 m)   Wt 69.4 kg (153 lb)   SpO2 98%   BMI 24.69 kg/m   Physical Exam  Constitutional: She appears well-developed and well-nourished.  Non-toxic appearance. No distress.  HENT:  Head: Normocephalic and atraumatic.  Right Ear: Tympanic membrane and ear canal normal.  Left Ear: Tympanic membrane and ear canal normal.  Mouth/Throat: Uvula is midline, oropharynx is clear and moist and mucous membranes are normal. Mucous membranes are not cyanotic. No oral lesions. No oropharyngeal exudate, posterior oropharyngeal edema, posterior oropharyngeal erythema or tonsillar abscesses. Tonsils are 0 on the right. Tonsils are 0 on the left.  Eyes: Conjunctivae are normal. Right eye exhibits no discharge. Left eye exhibits no discharge. No scleral icterus.  Neck: Normal range of motion. Neck supple.  Cardiovascular: Normal rate and regular  rhythm.  Pulmonary/Chest: Effort normal. No stridor. No respiratory distress.  Abdominal: She exhibits no distension.  Musculoskeletal: She exhibits no edema or deformity.  Lymphadenopathy:    She has no cervical adenopathy.  Neurological: She is alert. She exhibits normal muscle tone.  Skin: Skin is warm and dry. She is not diaphoretic.  Psychiatric: She has a normal mood and affect. Her behavior is normal.  Nursing note and vitals reviewed.    ED Treatments / Results  Labs (all labs ordered are listed, but only abnormal results are displayed) Labs Reviewed  CBC WITH DIFFERENTIAL/PLATELET - Abnormal; Notable for the following components:      Result Value   RBC 3.51 (*)    Hemoglobin 11.6 (*)    HCT 34.6 (*)    All other components within normal limits  RAPID STREP SCREEN (NOT AT [facility])  CULTURE, GROUP A STREP [facility])  COMPREHENSIVE METABOLIC PANEL    EKG None  Radiology Dg Chest 2 View  Result Date: 12/26/2017 CLINICAL DATA:  Sore throat and cough.  Denies fever and chills. EXAM: CHEST - 2 VIEW COMPARISON:  None. FINDINGS: The heart size and  mediastinal contours are within normal limits. Port catheter tip noted in the distal SVC. Faint opacity at the right lung base seen only on the frontal view is likely due to superimposition of overlapping breast tissue. No infiltrate seen on the lateral projection. No pulmonary edema, effusion or pneumothorax. The visualized skeletal structures are unremarkable. IMPRESSION: No active cardiopulmonary disease. Electronically Signed   By: Ashley Royalty M.D.   On: 12/26/2017 21:30    Procedures Procedures (including critical care time)  Medications Ordered in ED Medications - No data to display   Initial Impression / Assessment and Plan / ED Course  I have reviewed the triage vital signs and the nursing notes.  Pertinent labs & imaging results that were available during my care of the patient were reviewed by me and considered in my medical decision making (see chart for details).    The patient presents today for evaluation of sore throat since this morning.  She is a chemo patient actively receiving chemo and called the nurse line and was told to come here for evaluation.  Rapid strep negative, strep culture sent.  Throat is not erythematous or edematous, no tonsillar exudate.  He does not have any white plaques in her mouth, no evidence of oral thrush.  Her labs were obtained and reviewed, normal white count, mild anemia consistent with her baseline.  Based on very reassuring appearance, patient is hemodynamically stable and will be discharged home.  She was given strict return precautions and states her understanding.  She was instructed on conservative measures. Instructed to call oncologist in the morning.   Final Clinical Impressions(s) / ED Diagnoses   Final diagnoses:  Sore throat    ED Discharge Orders    None       Ollen Gross 12/27/17 0003    Pixie Casino, MD 12/27/17 2013

## 2017-12-26 NOTE — ED Triage Notes (Signed)
Pt is c/o a sore throat. States she called the cancer center nurse line and was advised to come in to r/o strep. Denies fever or chills, reporting ongoing chemo treatment.

## 2017-12-27 ENCOUNTER — Telehealth: Payer: Self-pay

## 2017-12-27 ENCOUNTER — Emergency Department (HOSPITAL_COMMUNITY): Payer: BC Managed Care – PPO

## 2017-12-27 ENCOUNTER — Emergency Department (HOSPITAL_COMMUNITY)
Admission: EM | Admit: 2017-12-27 | Discharge: 2017-12-28 | Disposition: A | Discharge status: Home / Self Care | Payer: BC Managed Care – PPO | Attending: Emergency Medicine | Admitting: Emergency Medicine

## 2017-12-27 ENCOUNTER — Encounter (HOSPITAL_COMMUNITY): Payer: Self-pay | Admitting: Emergency Medicine

## 2017-12-27 DIAGNOSIS — Z853 Personal history of malignant neoplasm of breast: Secondary | ICD-10-CM | POA: Diagnosis not present

## 2017-12-27 DIAGNOSIS — R509 Fever, unspecified: Secondary | ICD-10-CM | POA: Diagnosis present

## 2017-12-27 DIAGNOSIS — J069 Acute upper respiratory infection, unspecified: Secondary | ICD-10-CM | POA: Diagnosis not present

## 2017-12-27 DIAGNOSIS — Z79899 Other long term (current) drug therapy: Secondary | ICD-10-CM | POA: Diagnosis not present

## 2017-12-27 LAB — PROTIME-INR
INR: 1.15
PROTHROMBIN TIME: 14.6 s (ref 11.4–15.2)

## 2017-12-27 LAB — COMPREHENSIVE METABOLIC PANEL
ALBUMIN: 4.1 g/dL (ref 3.5–5.0)
ALK PHOS: 56 U/L (ref 38–126)
ALT: 25 U/L (ref 14–54)
ANION GAP: 11 (ref 5–15)
AST: 25 U/L (ref 15–41)
BUN: 9 mg/dL (ref 6–20)
CALCIUM: 9.2 mg/dL (ref 8.9–10.3)
CO2: 26 mmol/L (ref 22–32)
CREATININE: 0.52 mg/dL (ref 0.44–1.00)
Chloride: 105 mmol/L (ref 101–111)
GFR calc Af Amer: >60 mL/min (ref >60)
GFR calc non Af Amer: >60 mL/min (ref >60)
Glucose, Bld: 103 mg/dL — ABNORMAL HIGH (ref 65–99)
Potassium: 3.9 mmol/L (ref 3.5–5.1)
Sodium: 142 mmol/L (ref 135–145)
TOTAL PROTEIN: 7 g/dL (ref 6.5–8.1)
Total Bilirubin: 1.2 mg/dL (ref 0.3–1.2)

## 2017-12-27 LAB — URINALYSIS, ROUTINE W REFLEX MICROSCOPIC
Bilirubin Urine: NEGATIVE
Glucose, UA: NEGATIVE mg/dL
HGB URINE DIPSTICK: NEGATIVE
Ketones, ur: NEGATIVE mg/dL
Nitrite: NEGATIVE
PH: 7 (ref 5.0–8.0)
Protein, ur: NEGATIVE mg/dL
SPECIFIC GRAVITY, URINE: 1.005 (ref 1.005–1.030)
Squamous Epithelial / LPF: NONE SEEN

## 2017-12-27 LAB — I-STAT BETA HCG BLOOD, ED (MC, WL, AP ONLY): I-stat hCG, quantitative: 7.4 m[IU]/mL — ABNORMAL HIGH (ref <5)

## 2017-12-27 LAB — CBC WITH DIFFERENTIAL/PLATELET
BASOS ABS: 0 10*3/uL (ref 0.0–0.1)
BASOS PCT: 0 %
EOS ABS: 0 10*3/uL (ref 0.0–0.7)
EOS PCT: 0 %
HCT: 35.5 % — ABNORMAL LOW (ref 36.0–46.0)
Hemoglobin: 12 g/dL (ref 12.0–15.0)
Lymphocytes Relative: 11 %
Lymphs Abs: 0.8 10*3/uL (ref 0.7–4.0)
MCH: 33.7 pg (ref 26.0–34.0)
MCHC: 33.8 g/dL (ref 30.0–36.0)
MCV: 99.7 fL (ref 78.0–100.0)
MONO ABS: 0.2 10*3/uL (ref 0.1–1.0)
Monocytes Relative: 3 %
Neutro Abs: 5.8 10*3/uL (ref 1.7–7.7)
Neutrophils Relative %: 86 %
PLATELETS: 329 10*3/uL (ref 150–400)
RBC: 3.56 MIL/uL — AB (ref 3.87–5.11)
RDW: 15 % (ref 11.5–15.5)
WBC: 6.8 10*3/uL (ref 4.0–10.5)

## 2017-12-27 LAB — INFLUENZA PANEL BY PCR (TYPE A & B)
INFLBPCR: NEGATIVE
Influenza A By PCR: NEGATIVE

## 2017-12-27 LAB — I-STAT CG4 LACTIC ACID, ED
LACTIC ACID, VENOUS: 0.86 mmol/L (ref 0.5–1.9)
LACTIC ACID, VENOUS: 1.17 mmol/L (ref 0.5–1.9)

## 2017-12-27 MED ORDER — SODIUM CHLORIDE 0.9 % IV SOLN
2.0000 g | Freq: Once | INTRAVENOUS | Status: AC
  Administered 2017-12-27: 2 g via INTRAVENOUS
  Filled 2017-12-27: qty 2

## 2017-12-27 MED ORDER — SODIUM CHLORIDE 0.9 % IV BOLUS
1000.0000 mL | Freq: Once | INTRAVENOUS | Status: AC
  Administered 2017-12-27: 1000 mL via INTRAVENOUS

## 2017-12-27 NOTE — ED Notes (Signed)
PATIENT HAS A PORT BUT RATHER HAVE LABS DRAWN WHEN IV IS STARTED IN THE HAND

## 2017-12-27 NOTE — ED Triage Notes (Signed)
Patient seen yesterday for sore throat and cold sx. Reports today she developed a fever. Hx breast cancer, last chemo Friday.

## 2017-12-27 NOTE — ED Provider Notes (Signed)
[facility] DEPT Provider Note   CSN: [id number] Arrival date & time: 12/27/17  1517     History   Chief Complaint Chief Complaint  Patient presents with  . Fever    HPI Hannah Woodard is a 47 y.o. female.  The history is provided by the patient. No language interpreter was used.  Fever      Hannah Woodard is a 47 y.o. female who presents to the Emergency Department complaining of fever, sore throat. He has a history of breast cancer is currently undergoing chemotherapy. Her last treatment was on Friday. Yesterday she developed a sore throat, cough, body aches. She was seen in the emergency department and had a strep test performed as well as a chest x-ray, which were negative for acute process. She was discharged home yesterday. Today she reports a fever to 101.3. Denies any vomiting, diarrhea. Symptoms are moderate, constant, worsening.  Past Medical History:  Diagnosis Date  . Breast cancer ([place])   . Family history of ovarian cancer 09/02/2017  . Family history of prostate cancer in father 09/02/2017  . Family history of uterine cancer 09/02/2017  . Plantar fasciitis     Patient Active Problem List   Diagnosis Date Noted  . Port-A-Cath in place 11/12/2017  . Genetic testing 09/15/2017  . Malignant neoplasm of upper-inner quadrant of right breast in female, estrogen receptor positive ([place]) 08/27/2017  . Vitamin D deficiency 12/09/2015  . Medication management 12/09/2015  . Dense breast tissue 12/09/2015    History reviewed. No pertinent surgical history.   OB History   None      Home Medications    Prior to Admission medications   Medication Sig Start Date End Date Taking? Authorizing Provider  Ascorbic Acid (VITAMIN C PO) Take 400 Units by mouth 2 (two) times daily.   Yes [provider]  b complex vitamins tablet Take 1 tablet by mouth daily.   Yes [provider]  Cholecalciferol (VITAMIN D3) 5000 units  TABS Take 10,000 Units by mouth daily.   Yes [provider]  ECHINACEA PO Take 1 capsule by mouth as directed.    Yes [provider]  fish oil-omega-3 fatty acids 1000 MG capsule Take 1 g by mouth daily.   Yes [provider]  guaiFENesin (MUCINEX) 600 MG 12 hr tablet Take 600 mg by mouth 2 (two) times daily.   Yes [provider]  lidocaine-prilocaine (EMLA) cream Apply to affected area once 10/27/17  Yes Magrinat, Virgie Dad, MD  TURMERIC PO Take 1 capsule by mouth as directed.    Yes [provider]  UNABLE TO FIND Take 500 mg by mouth daily. [place] mushroom   Yes [provider]  UNABLE TO FIND Take 500 mg by mouth daily. [place] Mushroom   Yes [provider]  UNABLE TO FIND Take 5 mg by mouth as directed. CBD Oil; patient takes for nausea    Yes [provider]  Zinc Acetate 50 MG CAPS Take 1 capsule by mouth every other day.    Yes [provider]  Iodine Strong, Lugols, (STRONG IODINE) 5 % solution Take 0.2 mLs by mouth 3 (three) times daily. Patient not taking: Reported on 12/26/2017 12/02/16   Vicie Mutters, PA-C  ketoconazole (NIZORAL) 2 % cream Apply 1 application topically daily. Patient taking differently: Apply 1 application topically daily as needed for irritation.  12/17/17   Magrinat, Virgie Dad, MD  lidocaine (XYLOCAINE) 2 %  solution Use as directed 20 mLs in the mouth or throat every 6 (six) hours as needed for mouth pain.  12/17/17   [provider]  magic mouthwash w/lidocaine SOLN Take 5 mLs by mouth 4 (four) times daily as needed for mouth pain. Swish and spit 12/17/17   Magrinat, Virgie Dad, MD  ondansetron (ZOFRAN ODT) 8 MG disintegrating tablet 22m ODT q6 hours prn nausea Patient not taking: Reported on 12/20/2017 03/30/16   Forcucci, CLoma Sousa PA-C  ondansetron (ZOFRAN) 8 MG tablet Take 1 tablet (8 mg total) by mouth 2 (two) times daily as needed (Nausea or vomiting). Patient not taking:  Reported on 12/20/2017 10/27/17   Magrinat, GVirgie Dad MD  OVER THE COUNTER MEDICATION Take 1 tablet by mouth at bedtime as needed (constipation).     [provider]  prochlorperazine (COMPAZINE) 10 MG tablet Take 1 tablet (10 mg total) by mouth every 6 (six) hours as needed (Nausea or vomiting). 10/27/17   Magrinat, GVirgie Dad MD  triamcinolone ointment (KENALOG) 0.1 % Apply 1 application topically 2 (two) times daily. Patient taking differently: Apply 1 application topically 2 (two) times daily as needed (rash).  12/06/17   CLiane Comber NP    Family History Family History  Problem Relation Age of Onset  . Ovarian cancer Mother 54 . Prostate cancer Father 770      'high gleason' unsure number  . Ulcerative colitis Sister   . Other Sister        abn ovaian growth, partial hysterectomy  . Uterine cancer Maternal Aunt 573 . Stroke Maternal Aunt 66  . Basal cell carcinoma Brother   . Skin cancer Paternal Uncle   . Skin cancer Maternal Grandmother   . Stroke Paternal Grandfather 656 . Hydrocephalus Cousin     Social History Social History   Tobacco Use  . Smoking status: Never Smoker  . Smokeless tobacco: Never Used  Substance Use Topics  . Alcohol use: Yes  . Drug use: No     Allergies   Adhesive [tape]; Milk-related compounds; and Poison ivy extract [poison ivy extract]   Review of Systems Review of Systems  Constitutional: Positive for fever.  All other systems reviewed and are negative.    Physical Exam Updated Vital Signs BP 106/67   Pulse 90   Temp (!) 101.1 F (38.4 C) (Oral)   Resp (!) 22   SpO2 91%   Physical Exam  Constitutional: She is oriented to person, place, and time. She appears well-developed and well-nourished.  HENT:  Head: Normocephalic and atraumatic.  Mild erythema in the posterior oropharynx without any significant edema or exudates  Cardiovascular: Regular rhythm.  No murmur heard. Tachycardic  Pulmonary/Chest: Effort  normal and breath sounds normal. No respiratory distress.  Abdominal: Soft. There is no tenderness. There is no rebound and no guarding.  Musculoskeletal: She exhibits no edema or tenderness.  Lymphadenopathy:    She has no cervical adenopathy.  Neurological: She is alert and oriented to person, place, and time.  Skin: Skin is warm and dry.  Psychiatric: She has a normal mood and affect. Her behavior is normal.  Nursing note and vitals reviewed.    ED Treatments / Results  Labs (all labs ordered are listed, but only abnormal results are displayed) Labs Reviewed  COMPREHENSIVE METABOLIC PANEL - Abnormal; Notable for the following components:      Result Value   Glucose, Bld 103 (*)    All other components within normal  limits  CBC WITH DIFFERENTIAL/PLATELET - Abnormal; Notable for the following components:   RBC 3.56 (*)    HCT 35.5 (*)    All other components within normal limits  URINALYSIS, ROUTINE W REFLEX MICROSCOPIC - Abnormal; Notable for the following components:   Color, Urine AMBER (*)    APPearance HAZY (*)    Leukocytes, UA TRACE (*)    Bacteria, UA RARE (*)    All other components within normal limits  I-STAT BETA HCG BLOOD, ED ([facility], [facility], [facility] ONLY) - Abnormal; Notable for the following components:   I-stat hCG, quantitative 7.4 (*)    All other components within normal limits  CULTURE, BLOOD (ROUTINE X 2)  CULTURE, BLOOD (ROUTINE X 2)  PROTIME-INR  INFLUENZA PANEL BY PCR (TYPE A & B)  I-STAT CG4 LACTIC ACID, ED  I-STAT CG4 LACTIC ACID, ED    EKG None  Radiology Dg Chest 2 View  Result Date: 12/26/2017 CLINICAL DATA:  Sore throat and cough.  Denies fever and chills. EXAM: CHEST - 2 VIEW COMPARISON:  None. FINDINGS: The heart size and mediastinal contours are within normal limits. Port catheter tip noted in the distal SVC. Faint opacity at the right lung base seen only on the frontal view is likely due to superimposition of overlapping breast tissue. No  infiltrate seen on the lateral projection. No pulmonary edema, effusion or pneumothorax. The visualized skeletal structures are unremarkable. IMPRESSION: No active cardiopulmonary disease. Electronically Signed   By: Ashley Royalty M.D.   On: 12/26/2017 21:30    Procedures Procedures (including critical care time)  Medications Ordered in ED Medications  sodium chloride 0.9 % bolus 1,000 mL (0 mLs Intravenous Stopped 12/27/17 2221)  ceFEPIme (MAXIPIME) 2 g in sodium chloride 0.9 % 100 mL IVPB (0 g Intravenous Stopped 12/28/17 0000)     Initial Impression / Assessment and Plan / ED Course  I have reviewed the triage vital signs and the nursing notes.  Pertinent labs & imaging results that were available during my care of the patient were reviewed by me and considered in my medical decision making (see chart for details).     Patient here for evaluation of fever, sore throat, cough. She is non-toxic appearing on examination. Reviewed evaluation from yesterday. Rapid strep was negative. Chest x-ray with no acute disease. Presentation is not consistent with epiglotitis, peritonsillar abscess, RPA. Labs today demonstrate reassuring white blood cell count. No significant electrolyte abnormality. UA is not consistent with UTI. Flu swab is negative. Discussed with Dr. Marin Olp, on call for oncology - recommends one time dose of antibiotics with discharge home and outpatient follow-up. Discussed with patient findings of studies. Discussed likely viral process but will treat with one-time dose of antibiotics pending culture results. Plan to discharge home with close outpatient follow-up and return precautions. No current evidence of sepsis. Final Clinical Impressions(s) / ED Diagnoses   Final diagnoses:  Viral upper respiratory tract infection  Fever, unspecified fever cause    ED Discharge Orders    None       Quintella Reichert, MD 12/28/17 0109

## 2017-12-27 NOTE — ED Notes (Signed)
Bed: ZR00 Expected date:  Expected time:  Means of arrival:  Comments: Triage Cancer Pt

## 2017-12-27 NOTE — Telephone Encounter (Signed)
Received VM from pt regarding her recent ER visit for cold symptoms.  Returned pt's call and she reports she wanted to let us know about her recent ER Visit on 12-26-2017 for sore throat, congested cough, and feeling achy.  She reports she had no fever and remains free from fever.  She is taking Musinex and Tylenol ES and drinking lots of fluids to help thin out mucus.  She said her rapid strep test was negative.  She didn't go to work today and said she may not go tomorrow depending on how she feels and she wants to wait for the 2 day strep culture.  I praised the patient on doing all the right things to help manage her cold symptoms.  I encouraged her to call back if feeling worse or develops a fever. Discussed availability of seeing Lucianne Lei, our PA if needed.   Pt voice understanding and I reminded her she has an office appt on Friday here.

## 2017-12-28 ENCOUNTER — Other Ambulatory Visit: Payer: BC Managed Care – PPO

## 2017-12-28 ENCOUNTER — Telehealth: Payer: Self-pay

## 2017-12-28 ENCOUNTER — Ambulatory Visit: Payer: BC Managed Care – PPO

## 2017-12-28 NOTE — Telephone Encounter (Signed)
Received VM from pt regarding recent ER visit for fever.  Called pt back, she said she wanted to check in because she had to go to the ER again for a fever of 101.3.  Said she was told labs were normal and it was a virus, flu swab was neg. Verbalized her temp is now back to normal at 97.7.  She is taking Musinex and Tylenol ES as needed.  She has an appt with PCP tomorrow and with Dr Jana Hakim on Friday.  Pt would like to know if taking Elderberry syrup is ok to soothe her cough.  I let her know I would reach out to pharmacy to ok this.  Praised pt for following up on her symptoms ie going to ER and having appt with PCP tomorrow.  She will continue to watch her symptoms and call for fever or worsening of symptoms.

## 2017-12-29 ENCOUNTER — Encounter: Payer: Self-pay | Admitting: Physician Assistant

## 2017-12-29 ENCOUNTER — Ambulatory Visit (INDEPENDENT_AMBULATORY_CARE_PROVIDER_SITE_OTHER): Payer: BC Managed Care – PPO | Admitting: Physician Assistant

## 2017-12-29 VITALS — BP 112/74 | HR 101 | Temp 97.7°F | Resp 16 | Ht 66.0 in | Wt 146.8 lb

## 2017-12-29 DIAGNOSIS — B349 Viral infection, unspecified: Secondary | ICD-10-CM | POA: Diagnosis not present

## 2017-12-29 LAB — CULTURE, GROUP A STREP (THRC)

## 2017-12-29 MED ORDER — BENZONATATE 100 MG PO CAPS: 100.0000 mg | ORAL_CAPSULE | Freq: Three times a day (TID) | ORAL | 0 refills | Status: DC | PRN

## 2017-12-29 NOTE — Progress Notes (Signed)
Subjective:    Patient ID: Hannah Woodard, female    DOB: 1970/12/23, 47 y.o.   MRN: [id number]  HPI 47 y.o. WF with history of breast cancer getting chemotherapy, last treatment last Friday, presents with sore throat.   She woke up Sunday with sore throat, cough, called nursing hotline and they sent her to the ER. She saw ER 12/27/2017 for sore throat, cough, body aches and reported fever at home of 101.3. She had a normal CXR, rapid strep and flu negative, given fluids, CBC showed no neurtopenia and lactic acid normal Has been on mucinex.    She mainly continues to have cough. She is doing cough drops, water.   Blood pressure 112/74, pulse (!) 101, temperature 97.7 F (36.5 C), resp. rate 16, height 5' 6"  (1.676 m), weight 146 lb 12.8 oz (66.6 kg), SpO2 94 %.  Medications Current Outpatient Medications on File Prior to Visit  Medication Sig  . Ascorbic Acid (VITAMIN C PO) Take 400 Units by mouth 2 (two) times daily.  Marland Kitchen b complex vitamins tablet Take 1 tablet by mouth daily.  . Cholecalciferol (VITAMIN D3) 5000 units TABS Take 10,000 Units by mouth daily.  Marland Kitchen ECHINACEA PO Take 1 capsule by mouth as directed.   . fish oil-omega-3 fatty acids 1000 MG capsule Take 1 g by mouth daily.  Marland Kitchen guaiFENesin (MUCINEX) 600 MG 12 hr tablet Take 600 mg by mouth 2 (two) times daily.  . Iodine Strong, Lugols, (STRONG IODINE) 5 % solution Take 0.2 mLs by mouth 3 (three) times daily.  Marland Kitchen ketoconazole (NIZORAL) 2 % cream Apply 1 application topically daily. (Patient taking differently: Apply 1 application topically daily as needed for irritation. )  . lidocaine (XYLOCAINE) 2 % solution Use as directed 20 mLs in the mouth or throat every 6 (six) hours as needed for mouth pain.   Marland Kitchen lidocaine-prilocaine (EMLA) cream Apply to affected area once  . magic mouthwash w/lidocaine SOLN Take 5 mLs by mouth 4 (four) times daily as needed for mouth pain. Swish and spit  . ondansetron (ZOFRAN ODT) 8 MG disintegrating  tablet 31m ODT q6 hours prn nausea  . ondansetron (ZOFRAN) 8 MG tablet Take 1 tablet (8 mg total) by mouth 2 (two) times daily as needed (Nausea or vomiting).  .Marland KitchenOVER THE COUNTER MEDICATION Take 1 tablet by mouth at bedtime as needed (constipation).   . prochlorperazine (COMPAZINE) 10 MG tablet Take 1 tablet (10 mg total) by mouth every 6 (six) hours as needed (Nausea or vomiting).  . triamcinolone ointment (KENALOG) 0.1 % Apply 1 application topically 2 (two) times daily. (Patient taking differently: Apply 1 application topically 2 (two) times daily as needed (rash). )  . TURMERIC PO Take 1 capsule by mouth as directed.   .Marland KitchenUNABLE TO FIND Take 500 mg by mouth daily. [place] mushroom  . UNABLE TO FIND Take 500 mg by mouth daily. [place] Mushroom  . UNABLE TO FIND Take 5 mg by mouth as directed. CBD Oil; patient takes for nausea   . Zinc Acetate 50 MG CAPS Take 1 capsule by mouth every other day.    No current facility-administered medications on file prior to visit.     Problem list She has Vitamin D deficiency; Medication management; Dense breast tissue; Malignant neoplasm of upper-inner quadrant of right breast in female, estrogen receptor positive ([place]; Genetic testing; and Port-A-Cath in place on their problem list.   Review of Systems  Constitutional: Negative for chills and  diaphoresis.  HENT: Positive for postnasal drip. Negative for congestion, ear pain, sinus pressure, sneezing and sore throat.   Respiratory: Positive for cough. Negative for chest tightness, shortness of breath and wheezing.   Cardiovascular: Negative.   Gastrointestinal: Negative.   Genitourinary: Negative.   Musculoskeletal: Negative for neck pain.  Neurological: Positive for headaches.       Objective:   Physical Exam  Constitutional: She appears well-developed and well-nourished.  HENT:  Head: Normocephalic and atraumatic.  Right Ear: External ear normal.  Nose: Right sinus exhibits no maxillary  sinus tenderness and no frontal sinus tenderness. Left sinus exhibits no maxillary sinus tenderness and no frontal sinus tenderness.  Eyes: Conjunctivae and EOM are normal.  Neck: Normal range of motion. Neck supple.  Cardiovascular: Normal rate, regular rhythm, normal heart sounds and intact distal pulses.  Pulmonary/Chest: Effort normal and breath sounds normal. No respiratory distress. She has no wheezes.  Abdominal: Soft. Bowel sounds are normal.  Skin: Skin is warm and dry.      Assessment & Plan:   Viral illness No fever today in the office Throat without exudate/erythema, lungs CTAB Will send in cough med, continue claritin, rest Has had normal CBC -     benzonatate (TESSALON PERLES) 100 MG capsule; Take 1 capsule (100 mg total) by mouth 3 (three) times daily as needed for cough (Max: 675m per day).

## 2017-12-30 ENCOUNTER — Telehealth: Payer: Self-pay | Admitting: Pharmacist

## 2017-12-30 NOTE — Telephone Encounter (Signed)
Received message from [facility], RN re: pt using Elderberry for cough.  I s/w pt over phone and informed her that the Kendall Flack is fine to use (2.5 - 5 mL/day) as long as she avoids any Elderberry product that also contains Echinacea - avoid the following specific brands: Sambucus Force, Echinaforce & [employer]. She already uses Echinacea only on Mon - Thurs (timing specific around her chemo day).  The combination of Elderberry and Elderberry may increase N/V. There are no interactions b/w Elderberry and her other herbal supplements.  I suggested that she use pure Elderberry extract or consider making her own extract from dried Elderberries. Once her cough is better, she can d/c Elderberry.  Reference: Natural Medicines Database, accessed 12/30/17.  Pt did mention she had been to a naturalist and at some point she will consider starting the supplements they've recommended but not now.  She will "cross that bridge when she gets there."  Kennith Center, Pharm.D., CPP 12/30/2017@11 :23 AM

## 2017-12-30 NOTE — Progress Notes (Signed)
[place]  [phone] [phone]     ID: Hannah Woodard DOB: 07-Jul-1971  MR#: [id number]  JGG#:[id number]  Patient Care Team: Hannah Pinto, MD as PCP - General (Internal Medicine) Hannah Kussmaul, MD as Consulting Physician (General Surgery) Hannah Woodard, Hannah Dad, MD as Consulting Physician (Oncology) Hannah Rudd, MD as Consulting Physician (Radiation Oncology) Hannah Gibbs, MD (Radiology) Hannah Woodard, Hannah Fill, MD as Referring Physician (Surgery) OTHER MD:  CHIEF COMPLAINT: Triple positive breast cancer  CURRENT TREATMENT: anti-HER-2 immunotherapy, goserelin  INTERVAL HISTORY: Hannah Woodard returns today for follow-up and treatment of her triple positive breast cancer. Today is day 1 cycle 8 of paclitaxel.  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  This is an area where she had prior trauma and it is more sensitive to taxanes neuropathy risk.  She does not have any neuropathy in the hands.  She has had very fleeting symptoms of numbness and tingling in her left foot, luckily not permanent  She also receives trastuzumab every 21 days, recent dose 12/24/2017.  She tolerates that with no side effects that she is aware of.  REVIEW OF SYSTEMS: Hannah Woodard is doing okay. She woke up with a sore throat on Sunday, 12/26/2017 and was advised to go to the ED by the nurse hotline. Her symptoms gradually worsened after being discharged and she returned to the ED the next day on Monday 12/27/2017 with a fever. She was given medication and antibiotics which have helped to improve her symptoms. She still has a runny nose and phlegm production, but it is gradually improving. She has been experiencing neuropathy in her great right toe. She feels well enough to still receive chemotherapy treatment today. She denies unusual headaches, visual changes, nausea, vomiting, or dizziness. This been no change in bowel or bladder habits. She denies  unexplained fatigue or unexplained weight loss, bleeding, or rash. A detailed review of systems was otherwise noncontributory.   HISTORY OF CURRENT ILLNESS: From the original intake note:  The patient has a history of cysts and nodules in the right breast which have been closely followed, with exams 07/30/2016 and 01/28/2017.  On 07/29/2017 follow-up right breast ultrasonography showed no findings of concern.  Bilateral diagnostic mammography 08/18/2017 with right breast ultrasonography on 08/18/2017 at [facility] found the breast density to be category C.  There was now a new irregular high density mass in the posterior portion of the right breast seen on the mediolateral oblique view only.  Ultrasonography confirmed a 1.4 cm lobulated mass in the upper inner quadrant of the right breast 10 cm from the nipple associated with pectoral muscle invasion.  The right axilla was sonographically benign.  Biopsy of this mass 08/24/2017 showed ([id number]) invasive ductal carcinoma, grade 2, estrogen receptor 95% positive, progesterone receptor 100% positive, both with strong staining intensity, with an MIB-1 of 60%, and HER-2 amplified, with a signals ratio of 2.84 (full report pending).  The patient's subsequent history is as detailed below.  PAST MEDICAL HISTORY: Past Medical History:  Diagnosis Date  . Breast cancer ([place])   . Family history of ovarian cancer 09/02/2017  . Family history of prostate cancer in father 09/02/2017  . Family history of uterine cancer 09/02/2017  . Plantar fasciitis     PAST SURGICAL HISTORY: No past surgical history on file.  FAMILY HISTORY Family History  Problem Relation Age of Onset  . Ovarian cancer Mother 71  . Prostate cancer Father 22       '  high gleason' unsure number  . Ulcerative colitis Sister   . Other Sister        abn ovaian growth, partial hysterectomy  . Uterine cancer Maternal Aunt 76  . Stroke Maternal Aunt 66  . Basal cell carcinoma Brother   .  Skin cancer Paternal Uncle   . Skin cancer Maternal Grandmother   . Stroke Paternal Grandfather 9  . Hydrocephalus Cousin   The patient's father died from alcoholic cirrhosis at age 25.  He had been diagnosed with prostate cancer at age 22.  The patient's mother was diagnosed with ovarian cancer at age 71 and died a year later.  The patient has 1 brother, 1 sister.  The patient's brother has had basal cell and other skin cancers.  A paternal aunt had uterine cancer.  Other relatives have had skin cancers but the patient does not know if these were melanomas or not  GYNECOLOGIC HISTORY:  No LMP recorded. (Menstrual status: Chemotherapy).  Menarche age 48, the patient has never been pregnant.  She is still having regular periods.  She used oral contraceptives briefly in the past without complications. She and her husband are very interested in having children, and currently (December 2018) they are not using any contraception.   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  Her husband Hannah Woodard (goes by Hannah Woodard") is a Dealer.  At home it's just them, a [employer] and 2 cats.    ADVANCED DIRECTIVES: Not in place   HEALTH MAINTENANCE: Social History   Tobacco Use  . Smoking status: Never Smoker  . Smokeless tobacco: Never Used  Substance Use Topics  . Alcohol use: Yes  . Drug use: No     Colonoscopy: Never  PAP:  Bone density: Never   Allergies  Allergen Reactions  . Adhesive [Tape]   . Milk-Related Compounds Diarrhea    Stomach cramping  . Poison Ivy Extract [Poison Ivy Extract] Hives, Itching and Rash    Current Outpatient Medications  Medication Sig Dispense Refill  . Ascorbic Acid (VITAMIN C PO) Take 400 Units by mouth 2 (two) times daily.    Marland Kitchen b complex vitamins tablet Take 1 tablet by mouth daily.    . benzonatate (TESSALON PERLES) 100 MG capsule Take 1 capsule (100 mg total) by mouth 3 (three)  times daily as needed for cough (Max: 674m per day). 60 capsule 0  . Cholecalciferol (VITAMIN D3) 5000 units TABS Take 10,000 Units by mouth daily.    .Marland KitchenECHINACEA PO Take 1 capsule by mouth as directed.     . fish oil-omega-3 fatty acids 1000 MG capsule Take 1 g by mouth daily.    .Marland KitchenguaiFENesin (MUCINEX) 600 MG 12 hr tablet Take 600 mg by mouth 2 (two) times daily.    . Iodine Strong, Lugols, (STRONG IODINE) 5 % solution Take 0.2 mLs by mouth 3 (three) times daily. 480 mL 0  . ketoconazole (NIZORAL) 2 % cream Apply 1 application topically daily. (Patient taking differently: Apply 1 application topically daily as needed for irritation. ) 15 g 0  . lidocaine (XYLOCAINE) 2 % solution Use as directed 20 mLs in the mouth or throat every 6 (six) hours as needed for mouth pain.     .Marland Kitchenlidocaine-prilocaine (EMLA) cream Apply to affected area once 30 g 3  . magic mouthwash w/lidocaine SOLN Take 5 mLs by mouth 4 (four) times daily as needed for mouth pain. Swish  and spit 240 mL 1  . ondansetron (ZOFRAN ODT) 8 MG disintegrating tablet 5m ODT q6 hours prn nausea 30 tablet 0  . ondansetron (ZOFRAN) 8 MG tablet Take 1 tablet (8 mg total) by mouth 2 (two) times daily as needed (Nausea or vomiting). 30 tablet 1  . OVER THE COUNTER MEDICATION Take 1 tablet by mouth at bedtime as needed (constipation).     . prochlorperazine (COMPAZINE) 10 MG tablet Take 1 tablet (10 mg total) by mouth every 6 (six) hours as needed (Nausea or vomiting). 30 tablet 1  . triamcinolone ointment (KENALOG) 0.1 % Apply 1 application topically 2 (two) times daily. (Patient taking differently: Apply 1 application topically 2 (two) times daily as needed (rash). ) 80 g 1  . TURMERIC PO Take 1 capsule by mouth as directed.     .Marland KitchenUNABLE TO FIND Take 500 mg by mouth daily. [place] mushroom    . UNABLE TO FIND Take 500 mg by mouth daily. [place] Mushroom    . UNABLE TO FIND Take 5 mg by mouth as directed. CBD Oil; patient takes for nausea       . Zinc Acetate 50 MG CAPS Take 1 capsule by mouth every other day.      No current facility-administered medications for this visit.     OBJECTIVE: Young white woman wearing a mask  Vitals:   12/31/17 1053  BP: 125/83  Pulse: 83  Resp: 18  Temp: 98.6 F (37 C)  SpO2: 96%     Body mass index is 23.73 kg/m.   Wt Readings from Last 3 Encounters:  12/31/17 147 lb (66.7 kg)  12/29/17 146 lb 12.8 oz (66.6 kg)  12/26/17 153 lb (69.4 kg)   ECOG FS:1 - Symptomatic but completely ambulatory  Sclerae unicteric, pupils round and equal Oropharynx clear and moist No cervical or supraclavicular adenopathy Lungs no rales or rhonchi Heart regular rate and rhythm Abd soft, nontender, positive bowel sounds MSK no focal spinal tenderness, no upper extremity lymphedema Neuro: nonfocal, well oriented, appropriate affect Breasts: Status post bilateral reduction mammoplasty after right lumpectomy.  There is no evidence of local recurrence.  Both axillae are benign.  LAB RESULTS:  CMP     Component Value Date/Time   NA 140 12/31/2017 1019   NA 138 09/01/2017 1238   K 3.9 12/31/2017 1019   K 4.4 09/01/2017 1238   CL 105 12/31/2017 1019   CO2 26 12/31/2017 1019   CO2 25 09/01/2017 1238   GLUCOSE 92 12/31/2017 1019   GLUCOSE 83 09/01/2017 1238   BUN 10 12/31/2017 1019   BUN 9.2 09/01/2017 1238   CREATININE 0.62 12/31/2017 1019   CREATININE 0.7 09/01/2017 1238   CALCIUM 9.8 12/31/2017 1019   CALCIUM 9.4 09/01/2017 1238   PROT 6.8 12/31/2017 1019   PROT 7.4 09/01/2017 1238   ALBUMIN 3.8 12/31/2017 1019   ALBUMIN 4.2 09/01/2017 1238   AST 15 12/31/2017 1019   AST 21 09/01/2017 1238   ALT 19 12/31/2017 1019   ALT 21 09/01/2017 1238   ALKPHOS 56 12/31/2017 1019   ALKPHOS 60 09/01/2017 1238   BILITOT 0.4 12/31/2017 1019   BILITOT 0.65 09/01/2017 1238   GFRNONAA >60 12/31/2017 1019   GFRNONAA 111 05/18/2017 0928   GFRAA >60 12/31/2017 1019   GFRAA 128 05/18/2017 0928    No  results found for: TOTALPROTELP, ALBUMINELP, A1GS, A2GS, BETS, BETA2SER, GAMS, MSPIKE, SPEI  No results found for: KPAFRELGTCHN, LAMBDASER, KAPLAMBRATIO  Lab  Results  Component Value Date   WBC 4.8 12/31/2017   NEUTROABS 3.0 12/31/2017   HGB 12.0 12/27/2017   HCT 33.7 (L) 12/31/2017   MCV 99.1 12/31/2017   PLT 329 12/31/2017      Chemistry      Component Value Date/Time   NA 140 12/31/2017 1019   NA 138 09/01/2017 1238   K 3.9 12/31/2017 1019   K 4.4 09/01/2017 1238   CL 105 12/31/2017 1019   CO2 26 12/31/2017 1019   CO2 25 09/01/2017 1238   BUN 10 12/31/2017 1019   BUN 9.2 09/01/2017 1238   CREATININE 0.62 12/31/2017 1019   CREATININE 0.7 09/01/2017 1238      Component Value Date/Time   CALCIUM 9.8 12/31/2017 1019   CALCIUM 9.4 09/01/2017 1238   ALKPHOS 56 12/31/2017 1019   ALKPHOS 60 09/01/2017 1238   AST 15 12/31/2017 1019   AST 21 09/01/2017 1238   ALT 19 12/31/2017 1019   ALT 21 09/01/2017 1238   BILITOT 0.4 12/31/2017 1019   BILITOT 0.65 09/01/2017 1238       No results found for: LABCA2  No components found for: SPQZRA076  Recent Labs  Lab 12/27/17 1905  INR 1.15    No results found for: LABCA2  No results found for: AUQ333  No results found for: LKT625  No results found for: WLS937  No results found for: CA2729  No components found for: HGQUANT  No results found for: CEA1 / No results found for: CEA1   No results found for: AFPTUMOR  No results found for: CHROMOGRNA  No results found for: PSA1  Appointment on 12/31/2017  Component Date Value Ref Range Status  . WBC Count 12/31/2017 4.8  3.9 - 10.3 K/uL Final  . RBC 12/31/2017 3.40* 3.70 - 5.45 MIL/uL Final  . Hemoglobin 12/31/2017 11.3* 11.6 - 15.9 g/dL Final  . HCT 12/31/2017 33.7* 34.8 - 46.6 % Final  . MCV 12/31/2017 99.1  79.5 - 101.0 fL Final  . MCH 12/31/2017 33.2  25.1 - 34.0 pg Final  . MCHC 12/31/2017 33.5  31.5 - 36.0 g/dL Final  . RDW 12/31/2017 14.9* 11.2 - 14.5 %  Final  . Platelet Count 12/31/2017 329  145 - 400 K/uL Final  . Neutrophils Relative % 12/31/2017 60  % Final  . Neutro Abs 12/31/2017 3.0  1.5 - 6.5 K/uL Final  . Lymphocytes Relative 12/31/2017 25  % Final  . Lymphs Abs 12/31/2017 1.2  0.9 - 3.3 K/uL Final  . Monocytes Relative 12/31/2017 10  % Final  . Monocytes Absolute 12/31/2017 0.5  0.1 - 0.9 K/uL Final  . Eosinophils Relative 12/31/2017 4  % Final  . Eosinophils Absolute 12/31/2017 0.2  0.0 - 0.5 K/uL Final  . Basophils Relative 12/31/2017 1  % Final  . Basophils Absolute 12/31/2017 0.1  0.0 - 0.1 K/uL Final   Performed at [facility], [place] [address], [place], [place] [address]  . Sodium 12/31/2017 140  136 - 145 mmol/L Final  . Potassium 12/31/2017 3.9  3.5 - 5.1 mmol/L Final  . Chloride 12/31/2017 105  98 - 109 mmol/L Final  . CO2 12/31/2017 26  22 - 29 mmol/L Final  . Glucose, Bld 12/31/2017 92  70 - 140 mg/dL Final  . BUN 12/31/2017 10  7 - 26 mg/dL Final  . Creatinine 12/31/2017 0.62  0.60 - 1.10 mg/dL Final  . Calcium 12/31/2017 9.8  8.4 - 10.4 mg/dL Final  .  Total Protein 12/31/2017 6.8  6.4 - 8.3 g/dL Final  . Albumin 12/31/2017 3.8  3.5 - 5.0 g/dL Final  . AST 12/31/2017 15  5 - 34 U/L Final  . ALT 12/31/2017 19  0 - 55 U/L Final  . Alkaline Phosphatase 12/31/2017 56  40 - 150 U/L Final  . Total Bilirubin 12/31/2017 0.4  0.2 - 1.2 mg/dL Final  . GFR, Est Non Af Am 12/31/2017 >60  >60 mL/min Final  . GFR, Est AFR Am 12/31/2017 >60  >60 mL/min Final   Comment: (NOTE) The eGFR has been calculated using the CKD EPI equation. This calculation has not been validated in all clinical situations. eGFR's persistently <60 mL/min signify possible Chronic Kidney Disease.   Georgiann Hahn gap 12/31/2017 9  3 - 11 Final   Performed at [facility], [place] [address], [place], [place] [address]    (this displays the last labs from the last 3 days)  No results found for: TOTALPROTELP,  ALBUMINELP, A1GS, A2GS, BETS, BETA2SER, GAMS, MSPIKE, SPEI (this displays SPEP labs)  No results found for: KPAFRELGTCHN, LAMBDASER, KAPLAMBRATIO (kappa/lambda light chains)  No results found for: HGBA, HGBA2QUANT, HGBFQUANT, HGBSQUAN (Hemoglobinopathy evaluation)   No results found for: LDH  Lab Results  Component Value Date   IRON 132 12/21/2017   TIBC 305 12/21/2017   IRONPCTSAT 43 12/21/2017   (Iron and TIBC)  Lab Results  Component Value Date   FERRITIN 67 12/16/2016    Urinalysis    Component Value Date/Time   COLORURINE AMBER (A) 12/27/2017 1905   APPEARANCEUR HAZY (A) 12/27/2017 1905   LABSPEC 1.005 12/27/2017 1905   PHURINE 7.0 12/27/2017 1905   GLUCOSEU NEGATIVE 12/27/2017 1905   HGBUR NEGATIVE 12/27/2017 1905   BILIRUBINUR NEGATIVE 12/27/2017 1905   KETONESUR NEGATIVE 12/27/2017 1905   PROTEINUR NEGATIVE 12/27/2017 1905   UROBILINOGEN 0.2 12/06/2014 1529   NITRITE NEGATIVE 12/27/2017 1905   LEUKOCYTESUR TRACE (A) 12/27/2017 1905    STUDIES: Dg Chest 2 View  Result Date: 12/26/2017 CLINICAL DATA:  Sore throat and cough.  Denies fever and chills. EXAM: CHEST - 2 VIEW COMPARISON:  None. FINDINGS: The heart size and mediastinal contours are within normal limits. Port catheter tip noted in the distal SVC. Faint opacity at the right lung base seen only on the frontal view is likely due to superimposition of overlapping breast tissue. No infiltrate seen on the lateral projection. No pulmonary edema, effusion or pneumothorax. The visualized skeletal structures are unremarkable. IMPRESSION: No active cardiopulmonary disease. Electronically Signed   By: Ashley Royalty M.D.   On: 12/26/2017 21:30     ELIGIBLE FOR AVAILABLE RESEARCH PROTOCOL: No  ASSESSMENT: 47 y.o. [place] woman status post right breast upper inner quadrant biopsy 08/24/2017 for a clinical T1c N0 invasive ductal carcinoma, triple positive, with an MIB-1 of 60%  (1) genetics testing 09/10/2017  through the Common Hereditary Cancer Panel + Melanoma Panel found no deleterious mutations in: APC, ATM, AXIN2, BAP1, BARD1, BMPR1A, BRCA1, BRCA2, BRIP1, CDH1, CDK4, CDKN2A (p14ARF), CDKN2A (p16INK4a), CHEK2, CTNNA1, DICER1, EPCAM*, GREM1*, KIT, MEN1, MLH1, MSH2, MSH3, MSH6, MUTYH, NBN, NF1, PALB2, PDGFRA, PMS2, POLD1, POLE, POT1, PTEN, RAD50, RAD51C, RAD51D, RB1, SDHB, SDHC, SDHD, SMAD4, SMARCA4, STK11, TP53, TSC1, TSC2, VHL. The following genes were evaluated for sequence changes only: HOXB13*, MITF*, NTHL1*, SDHA  (2) status post right lumpectomy and sentinel axillary lymph node sampling 10/07/2017 at [facility] for a pT1C pN0, stage IA invasive ductal carcinoma, grade 3, with  negative margins  (a) 1 sentinel lymph node removed, bilateral oncoplastic breast reduction  (3) adjuvant chemotherapy consisting of paclitaxel weekly x12 and trastuzumab every 21 days STARTING 11/12/2017  (a) paclitaxel discontinued after 7 doses (last dose 12/24/2017) due to neuropathy  (4) continue trastuzumab to complete a year (through February 2020).  (a) baseline echocardiogram 09/10/2017 shows an ejection fraction of 55-60%  (b) echocardiogram 12/09/2017 showed an ejection fraction in the 60-65% range  (5) adjuvant radiation to follow   (6) anastrozole to start 01/26/2018  (7) goserelin for fertility preservation started 09/29/2017  PLAN:  Hannah Woodard had an episode of upper respiratory viral infection which is resolving.  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  She is able to walk normally and does not have pain or significant tingling even at night, nevertheless she is at risk for disability if we continue the Taxol.  Accordingly we are stopping the Taxol at this point.  She is eager to start on aromatase inhibitors however I suggested she wait until 01/26/2018 so she does not confuse issues related to the chemotherapy with the aromatase inhibitors.  Recall that she is  on goserelin for fertility preservation.  If she wishes to stay on anastrozole we will have to continue the goserelin indefinitely.  We are of course continuing the trastuzumab until she completes a year, which will take [place] to next February.  She is now ready for adjuvant radiation.  I have alerted Dr. Lisbeth Renshaw regarding that  She knows to call for any other issues that may develop before her next visit.   Hannah Woodard, Hannah Dad, MD  12/31/17 11:35 AM Medical Oncology and Hematology [facility] [address] [place], [place] [address] Tel. [phone]    Fax. [phone]  This document serves as a record of services personally performed by Chauncey Cruel, MD. It was created on his behalf by Margit Banda, a trained medical scribe. The creation of this record is based on the scribe's personal observations and the provider's statements to them.   I have reviewed the above documentation for accuracy and completeness, and I agree with the above.

## 2017-12-31 ENCOUNTER — Telehealth: Payer: Self-pay | Admitting: Oncology

## 2017-12-31 ENCOUNTER — Inpatient Hospital Stay: Payer: BC Managed Care – PPO

## 2017-12-31 ENCOUNTER — Inpatient Hospital Stay (HOSPITAL_BASED_OUTPATIENT_CLINIC_OR_DEPARTMENT_OTHER): Payer: BC Managed Care – PPO | Admitting: Oncology

## 2017-12-31 ENCOUNTER — Inpatient Hospital Stay: Payer: BC Managed Care – PPO | Attending: Oncology

## 2017-12-31 VITALS — BP 125/83 | HR 83 | Temp 98.6°F | Resp 18 | Ht 66.0 in | Wt 147.0 lb

## 2017-12-31 DIAGNOSIS — Z170 Estrogen receptor positive status [ER+]: Secondary | ICD-10-CM | POA: Insufficient documentation

## 2017-12-31 DIAGNOSIS — C50211 Malignant neoplasm of upper-inner quadrant of right female breast: Secondary | ICD-10-CM | POA: Diagnosis present

## 2017-12-31 DIAGNOSIS — Z79899 Other long term (current) drug therapy: Secondary | ICD-10-CM | POA: Diagnosis not present

## 2017-12-31 DIAGNOSIS — Z8042 Family history of malignant neoplasm of prostate: Secondary | ICD-10-CM

## 2017-12-31 DIAGNOSIS — G620 Drug-induced polyneuropathy: Secondary | ICD-10-CM

## 2017-12-31 DIAGNOSIS — Z79818 Long term (current) use of other agents affecting estrogen receptors and estrogen levels: Secondary | ICD-10-CM | POA: Diagnosis not present

## 2017-12-31 DIAGNOSIS — Z8041 Family history of malignant neoplasm of ovary: Secondary | ICD-10-CM | POA: Insufficient documentation

## 2017-12-31 DIAGNOSIS — Z9225 Personal history of immunosupression therapy: Secondary | ICD-10-CM | POA: Insufficient documentation

## 2017-12-31 DIAGNOSIS — T451X5S Adverse effect of antineoplastic and immunosuppressive drugs, sequela: Secondary | ICD-10-CM

## 2017-12-31 DIAGNOSIS — R232 Flushing: Secondary | ICD-10-CM | POA: Insufficient documentation

## 2017-12-31 DIAGNOSIS — R197 Diarrhea, unspecified: Secondary | ICD-10-CM | POA: Insufficient documentation

## 2017-12-31 DIAGNOSIS — Z9221 Personal history of antineoplastic chemotherapy: Secondary | ICD-10-CM

## 2017-12-31 DIAGNOSIS — J069 Acute upper respiratory infection, unspecified: Secondary | ICD-10-CM

## 2017-12-31 DIAGNOSIS — Z803 Family history of malignant neoplasm of breast: Secondary | ICD-10-CM | POA: Diagnosis not present

## 2017-12-31 DIAGNOSIS — Z5112 Encounter for antineoplastic immunotherapy: Secondary | ICD-10-CM | POA: Diagnosis not present

## 2017-12-31 DIAGNOSIS — T451X5A Adverse effect of antineoplastic and immunosuppressive drugs, initial encounter: Secondary | ICD-10-CM

## 2017-12-31 DIAGNOSIS — R5383 Other fatigue: Secondary | ICD-10-CM | POA: Insufficient documentation

## 2017-12-31 DIAGNOSIS — Z95828 Presence of other vascular implants and grafts: Secondary | ICD-10-CM

## 2017-12-31 LAB — CBC WITH DIFFERENTIAL (CANCER CENTER ONLY)
BASOS ABS: 0.1 10*3/uL (ref 0.0–0.1)
BASOS PCT: 1 %
Eosinophils Absolute: 0.2 10*3/uL (ref 0.0–0.5)
Eosinophils Relative: 4 %
HCT: 33.7 % — ABNORMAL LOW (ref 34.8–46.6)
Hemoglobin: 11.3 g/dL — ABNORMAL LOW (ref 11.6–15.9)
LYMPHS PCT: 25 %
Lymphs Abs: 1.2 10*3/uL (ref 0.9–3.3)
MCH: 33.2 pg (ref 25.1–34.0)
MCHC: 33.5 g/dL (ref 31.5–36.0)
MCV: 99.1 fL (ref 79.5–101.0)
MONO ABS: 0.5 10*3/uL (ref 0.1–0.9)
Monocytes Relative: 10 %
Neutro Abs: 3 10*3/uL (ref 1.5–6.5)
Neutrophils Relative %: 60 %
PLATELETS: 329 10*3/uL (ref 145–400)
RBC: 3.4 MIL/uL — AB (ref 3.70–5.45)
RDW: 14.9 % — AB (ref 11.2–14.5)
WBC: 4.8 10*3/uL (ref 3.9–10.3)

## 2017-12-31 LAB — CMP (CANCER CENTER ONLY)
ALBUMIN: 3.8 g/dL (ref 3.5–5.0)
ALT: 19 U/L (ref 0–55)
AST: 15 U/L (ref 5–34)
Alkaline Phosphatase: 56 U/L (ref 40–150)
Anion gap: 9 (ref 3–11)
BUN: 10 mg/dL (ref 7–26)
CHLORIDE: 105 mmol/L (ref 98–109)
CO2: 26 mmol/L (ref 22–29)
Calcium: 9.8 mg/dL (ref 8.4–10.4)
Creatinine: 0.62 mg/dL (ref 0.60–1.10)
GFR, Est AFR Am: >60 mL/min (ref >60)
GFR, Estimated: >60 mL/min (ref >60)
GLUCOSE: 92 mg/dL (ref 70–140)
Potassium: 3.9 mmol/L (ref 3.5–5.1)
Sodium: 140 mmol/L (ref 136–145)
Total Bilirubin: 0.4 mg/dL (ref 0.2–1.2)
Total Protein: 6.8 g/dL (ref 6.4–8.3)

## 2017-12-31 MED ORDER — ANASTROZOLE 1 MG PO TABS: 1.0000 mg | ORAL_TABLET | Freq: Every day | ORAL | 6 refills | Status: DC

## 2017-12-31 MED ORDER — SODIUM CHLORIDE 0.9% FLUSH
10.0000 mL | Freq: Once | INTRAVENOUS | Status: AC
  Administered 2017-12-31: 10 mL
  Filled 2017-12-31: qty 10

## 2017-12-31 NOTE — Telephone Encounter (Signed)
Patient declined avs and calendar  Also wanted earlier times

## 2017-12-31 NOTE — Patient Instructions (Signed)

## 2018-01-01 LAB — CULTURE, BLOOD (ROUTINE X 2)
CULTURE: NO GROWTH
CULTURE: NO GROWTH

## 2018-01-02 ENCOUNTER — Encounter: Payer: Self-pay | Admitting: Physician Assistant

## 2018-01-03 MED ORDER — PROMETHAZINE-DM 6.25-15 MG/5ML PO SYRP: 5.0000 mL | ORAL_SOLUTION | Freq: Four times a day (QID) | ORAL | 1 refills | Status: DC | PRN

## 2018-01-04 ENCOUNTER — Other Ambulatory Visit: Payer: BC Managed Care – PPO

## 2018-01-04 ENCOUNTER — Ambulatory Visit: Payer: BC Managed Care – PPO

## 2018-01-07 ENCOUNTER — Other Ambulatory Visit: Payer: BC Managed Care – PPO

## 2018-01-07 ENCOUNTER — Ambulatory Visit: Payer: BC Managed Care – PPO

## 2018-01-10 ENCOUNTER — Encounter: Payer: Self-pay | Admitting: Radiation Oncology

## 2018-01-11 ENCOUNTER — Ambulatory Visit: Payer: BC Managed Care – PPO

## 2018-01-11 ENCOUNTER — Other Ambulatory Visit: Payer: BC Managed Care – PPO

## 2018-01-13 ENCOUNTER — Other Ambulatory Visit: Payer: BC Managed Care – PPO

## 2018-01-14 ENCOUNTER — Inpatient Hospital Stay: Payer: BC Managed Care – PPO

## 2018-01-14 ENCOUNTER — Ambulatory Visit: Payer: BC Managed Care – PPO

## 2018-01-14 ENCOUNTER — Other Ambulatory Visit: Payer: BC Managed Care – PPO

## 2018-01-14 VITALS — BP 124/74 | HR 66 | Temp 98.1°F | Resp 17

## 2018-01-14 DIAGNOSIS — C50211 Malignant neoplasm of upper-inner quadrant of right female breast: Secondary | ICD-10-CM

## 2018-01-14 DIAGNOSIS — Z170 Estrogen receptor positive status [ER+]: Secondary | ICD-10-CM

## 2018-01-14 DIAGNOSIS — Z95828 Presence of other vascular implants and grafts: Secondary | ICD-10-CM

## 2018-01-14 LAB — CMP (CANCER CENTER ONLY)
ALK PHOS: 60 U/L (ref 40–150)
ALT: 21 U/L (ref 0–55)
ANION GAP: 6 (ref 3–11)
AST: 19 U/L (ref 5–34)
Albumin: 3.8 g/dL (ref 3.5–5.0)
BILIRUBIN TOTAL: 0.5 mg/dL (ref 0.2–1.2)
BUN: 16 mg/dL (ref 7–26)
CALCIUM: 9.4 mg/dL (ref 8.4–10.4)
CO2: 28 mmol/L (ref 22–29)
CREATININE: 0.62 mg/dL (ref 0.60–1.10)
Chloride: 107 mmol/L (ref 98–109)
Glucose, Bld: 88 mg/dL (ref 70–140)
Potassium: 3.8 mmol/L (ref 3.5–5.1)
Sodium: 141 mmol/L (ref 136–145)
TOTAL PROTEIN: 6.5 g/dL (ref 6.4–8.3)

## 2018-01-14 LAB — CBC WITH DIFFERENTIAL (CANCER CENTER ONLY)
Basophils Absolute: 0.1 10*3/uL (ref 0.0–0.1)
Basophils Relative: 1 %
Eosinophils Absolute: 0.2 10*3/uL (ref 0.0–0.5)
Eosinophils Relative: 2 %
HCT: 34.5 % — ABNORMAL LOW (ref 34.8–46.6)
HEMOGLOBIN: 11.6 g/dL (ref 11.6–15.9)
LYMPHS ABS: 1.4 10*3/uL (ref 0.9–3.3)
LYMPHS PCT: 22 %
MCH: 32.9 pg (ref 25.1–34.0)
MCHC: 33.5 g/dL (ref 31.5–36.0)
MCV: 98.1 fL (ref 79.5–101.0)
MONOS PCT: 8 %
Monocytes Absolute: 0.5 10*3/uL (ref 0.1–0.9)
NEUTROS PCT: 67 %
Neutro Abs: 4.4 10*3/uL (ref 1.5–6.5)
Platelet Count: 289 10*3/uL (ref 145–400)
RBC: 3.52 MIL/uL — AB (ref 3.70–5.45)
RDW: 15 % — ABNORMAL HIGH (ref 11.2–14.5)
WBC: 6.5 10*3/uL (ref 3.9–10.3)

## 2018-01-14 MED ORDER — ACETAMINOPHEN 325 MG PO TABS
ORAL_TABLET | ORAL | Status: AC
  Filled 2018-01-14: qty 2

## 2018-01-14 MED ORDER — SODIUM CHLORIDE 0.9% FLUSH
10.0000 mL | Freq: Once | INTRAVENOUS | Status: AC
  Administered 2018-01-14: 10 mL
  Filled 2018-01-14: qty 10

## 2018-01-14 MED ORDER — TRASTUZUMAB CHEMO 150 MG IV SOLR
6.0000 mg/kg | Freq: Once | INTRAVENOUS | Status: AC
  Administered 2018-01-14: 441 mg via INTRAVENOUS
  Filled 2018-01-14: qty 21

## 2018-01-14 MED ORDER — DIPHENHYDRAMINE HCL 25 MG PO CAPS
ORAL_CAPSULE | ORAL | Status: AC
  Filled 2018-01-14: qty 1

## 2018-01-14 MED ORDER — HEPARIN SOD (PORK) LOCK FLUSH 100 UNIT/ML IV SOLN
500.0000 [IU] | Freq: Once | INTRAVENOUS | Status: AC | PRN
  Administered 2018-01-14: 500 [IU]
  Filled 2018-01-14: qty 5

## 2018-01-14 MED ORDER — ACETAMINOPHEN 325 MG PO TABS
650.0000 mg | ORAL_TABLET | Freq: Once | ORAL | Status: AC
  Administered 2018-01-14: 650 mg via ORAL

## 2018-01-14 MED ORDER — SODIUM CHLORIDE 0.9% FLUSH
10.0000 mL | INTRAVENOUS | Status: DC | PRN
  Administered 2018-01-14: 10 mL
  Filled 2018-01-14: qty 10

## 2018-01-14 MED ORDER — SODIUM CHLORIDE 0.9 % IV SOLN
Freq: Once | INTRAVENOUS | Status: AC
  Administered 2018-01-14: 13:00:00 via INTRAVENOUS

## 2018-01-14 MED ORDER — DIPHENHYDRAMINE HCL 25 MG PO CAPS
25.0000 mg | ORAL_CAPSULE | Freq: Once | ORAL | Status: AC
  Administered 2018-01-14: 25 mg via ORAL

## 2018-01-14 NOTE — Patient Instructions (Signed)
[facility] Discharge Instructions for Patients Receiving Chemotherapy  Today you received the following chemotherapy agents Herceptin  To help prevent nausea and vomiting after your treatment, we encourage you to take your nausea medication as directed   If you develop nausea and vomiting that is not controlled by your nausea medication, call the clinic.   BELOW ARE SYMPTOMS THAT SHOULD BE REPORTED IMMEDIATELY:  *FEVER GREATER THAN 100.5 F  *CHILLS WITH OR WITHOUT FEVER  NAUSEA AND VOMITING THAT IS NOT CONTROLLED WITH YOUR NAUSEA MEDICATION  *UNUSUAL SHORTNESS OF BREATH  *UNUSUAL BRUISING OR BLEEDING  TENDERNESS IN MOUTH AND THROAT WITH OR WITHOUT PRESENCE OF ULCERS  *URINARY PROBLEMS  *BOWEL PROBLEMS  UNUSUAL RASH Items with * indicate a potential emergency and should be followed up as soon as possible.  Feel free to call the clinic should you have any questions or concerns. The clinic phone number is [phone].  Please show the CHEMO ALERT CARD at check-in to the Emergency Department and triage nurse.   

## 2018-01-18 ENCOUNTER — Other Ambulatory Visit: Payer: BC Managed Care – PPO

## 2018-01-18 ENCOUNTER — Ambulatory Visit: Payer: BC Managed Care – PPO

## 2018-01-20 NOTE — Progress Notes (Signed)
[place]  [phone] [phone]     ID: THAYER INABINET DOB: 16-Feb-1971  MR#: [id number]  NGE#:[id number]  Patient Care Team: Unk Pinto, MD as PCP - General (Internal Medicine) Jovita Kussmaul, MD as Consulting Physician (General Surgery) Markian Glockner, Virgie Dad, MD as Consulting Physician (Oncology) Kyung Rudd, MD as Consulting Physician (Radiation Oncology) Lorelle Gibbs, MD (Radiology) Howard-McNatt, Mable Fill, MD as Referring Physician (Surgery) OTHER MD:  CHIEF COMPLAINT: Triple positive breast cancer  CURRENT TREATMENT: Manuela Schwartz, goserelin  INTERVAL HISTORY: Hannah Woodard returns today for follow-up and treatment of her triple positive breast cancer. She recieves trastuzumab every 21 days, with her most recent dose being 01/14/2018. She tolerates this well. She felt a fatigued afterwards due to the benadryl IV, but she did not feel as fatigued when they gave her a benadryl pill. She felt that her feet were itchy. She hasn't felt itchy over the last week. 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   She also receives goserelin with a shot due today. She tolerates this well.   REVIEW OF SYSTEMS: Hannah Woodard reports that she had diarrhea and painful gas today. She has been gassy over the last 1-2 weeks. She started eating more vegetables. She had a regular bowel movement this morning. She has been drinking water, and she doesn't feel dehydrated. She worked out 2 days in a row this week, for exercise. She is concerned how fatigued she will feel during her upcomming radiation treatments. She is concerned about having enough energy to continue to work. She has some slight pain in her breasts from her breast reduction surgery, but she manages this. She denies unusual headaches, visual changes, nausea, vomiting, or dizziness. There has been no unusual cough, phlegm production, or pleurisy. This been no change in bowel or bladder habits. She  denies unexplained fatigue or unexplained weight loss, bleeding, rash, or fever. A detailed review of systems was otherwise stable.    HISTORY OF CURRENT ILLNESS: From the original intake note:  The patient has a history of cysts and nodules in the right breast which have been closely followed, with exams 07/30/2016 and 01/28/2017.  On 07/29/2017 follow-up right breast ultrasonography showed no findings of concern.  Bilateral diagnostic mammography 08/18/2017 with right breast ultrasonography on 08/18/2017 at [facility] found the breast density to be category C.  There was now a new irregular high density mass in the posterior portion of the right breast seen on the mediolateral oblique view only.  Ultrasonography confirmed a 1.4 cm lobulated mass in the upper inner quadrant of the right breast 10 cm from the nipple associated with pectoral muscle invasion.  The right axilla was sonographically benign.  Biopsy of this mass 08/24/2017 showed ([id number]) invasive ductal carcinoma, grade 2, estrogen receptor 95% positive, progesterone receptor 100% positive, both with strong staining intensity, with an MIB-1 of 60%, and HER-2 amplified, with a signals ratio of 2.84 (full report pending).  The patient's subsequent history is as detailed below.  PAST MEDICAL HISTORY: Past Medical History:  Diagnosis Date  . Breast cancer ([place])   . Family history of ovarian cancer 09/02/2017  . Family history of prostate cancer in father 09/02/2017  . Family history of uterine cancer 09/02/2017  . Plantar fasciitis     PAST SURGICAL HISTORY: No past surgical history on file.  FAMILY HISTORY Family History  Problem Relation Age of Onset  . Ovarian cancer Mother 18  . Prostate cancer Father 16       '  high gleason' unsure number  . Ulcerative colitis Sister   . Other Sister        abn ovaian growth, partial hysterectomy  . Uterine cancer Maternal Aunt 23  . Stroke Maternal Aunt 66  . Basal cell carcinoma  Brother   . Skin cancer Paternal Uncle   . Skin cancer Maternal Grandmother   . Stroke Paternal Grandfather 74  . Hydrocephalus Cousin   The patient's father died from alcoholic cirrhosis at age 42.  He had been diagnosed with prostate cancer at age 15.  The patient's mother was diagnosed with ovarian cancer at age 33 and died a year later.  The patient has 1 brother, 1 sister.  The patient's brother has had basal cell and other skin cancers.  A paternal aunt had uterine cancer.  Other relatives have had skin cancers but the patient does not know if these were melanomas or not  GYNECOLOGIC HISTORY:  No LMP recorded. (Menstrual status: Chemotherapy).  Menarche age 71, the patient has never been pregnant.  She is still having regular periods.  She used oral contraceptives briefly in the past without complications. She and her husband are very interested in having children, and currently (December 2018) they are not using any contraception.   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  Her husband Rodman Key (goes by [employer]") is a Dealer.  At home it's just them, a [employer] and 2 cats.    ADVANCED DIRECTIVES: Not in place   HEALTH MAINTENANCE: Social History   Tobacco Use  . Smoking status: Never Smoker  . Smokeless tobacco: Never Used  Substance Use Topics  . Alcohol use: Yes  . Drug use: No     Colonoscopy: Never  PAP:  Bone density: Never   Allergies  Allergen Reactions  . Adhesive [Tape]   . Milk-Related Compounds Diarrhea    Stomach cramping  . Poison Ivy Extract [Poison Ivy Extract] Hives, Itching and Rash    Current Outpatient Medications  Medication Sig Dispense Refill  . anastrozole (ARIMIDEX) 1 MG tablet Take 1 tablet (1 mg total) by mouth daily. 30 tablet 6  . Ascorbic Acid (VITAMIN C PO) Take 400 Units by mouth 2 (two) times daily.    Marland Kitchen b complex vitamins tablet Take 1 tablet by mouth  daily.    . benzonatate (TESSALON PERLES) 100 MG capsule Take 1 capsule (100 mg total) by mouth 3 (three) times daily as needed for cough (Max: 655m per day). 60 capsule 0  . Cholecalciferol (VITAMIN D3) 5000 units TABS Take 10,000 Units by mouth daily.    .Marland KitchenECHINACEA PO Take 1 capsule by mouth as directed.     . fish oil-omega-3 fatty acids 1000 MG capsule Take 1 g by mouth daily.    .Marland KitchenguaiFENesin (MUCINEX) 600 MG 12 hr tablet Take 600 mg by mouth 2 (two) times daily.    . Iodine Strong, Lugols, (STRONG IODINE) 5 % solution Take 0.2 mLs by mouth 3 (three) times daily. 480 mL 0  . ketoconazole (NIZORAL) 2 % cream Apply 1 application topically daily. (Patient taking differently: Apply 1 application topically daily as needed for irritation. ) 15 g 0  . lidocaine (XYLOCAINE) 2 % solution Use as directed 20 mLs in the mouth or throat every 6 (six) hours as needed for mouth pain.     . magic mouthwash w/lidocaine SOLN Take 5 mLs by mouth 4 (four) times daily  as needed for mouth pain. Swish and spit 240 mL 1  . ondansetron (ZOFRAN ODT) 8 MG disintegrating tablet 31m ODT q6 hours prn nausea 30 tablet 0  . OVER THE COUNTER MEDICATION Take 1 tablet by mouth at bedtime as needed (constipation).     . promethazine-dextromethorphan (PROMETHAZINE-DM) 6.25-15 MG/5ML syrup Take 5 mLs by mouth 4 (four) times daily as needed for cough. 240 mL 1  . triamcinolone ointment (KENALOG) 0.1 % Apply 1 application topically 2 (two) times daily. (Patient taking differently: Apply 1 application topically 2 (two) times daily as needed (rash). ) 80 g 1  . TURMERIC PO Take 1 capsule by mouth as directed.     .Marland KitchenUNABLE TO FIND Take 500 mg by mouth daily. TK[place]Tail mushroom    . UNABLE TO FIND Take 500 mg by mouth daily. [place] Mushroom    . UNABLE TO FIND Take 5 mg by mouth as directed. CBD Oil; patient takes for nausea     . Zinc Acetate 50 MG CAPS Take 1 capsule by mouth every other day.      No current  facility-administered medications for this visit.     OBJECTIVE: Young white woman appears well  Vitals:   01/21/18 1139  BP: 118/76  Pulse: 75  Resp: 19  Temp: 98.6 F (37 C)  SpO2: 98%     Body mass index is 23.66 kg/m.   Wt Readings from Last 3 Encounters:  01/21/18 146 lb 9.6 oz (66.5 kg)  12/31/17 147 lb (66.7 kg)  12/29/17 146 lb 12.8 oz (66.6 kg)   ECOG FS:1 - Symptomatic but completely ambulatory  Sclerae unicteric, EOMs intact Oropharynx clear and moist No cervical or supraclavicular adenopathy Lungs no rales or rhonchi Heart regular rate and rhythm Abd soft, nontender, positive bowel sounds MSK no focal spinal tenderness, no upper extremity lymphedema Neuro: nonfocal, well oriented, appropriate affect Breasts: Status post bilateral reduction mammoplasty and right lumpectomy.  The cosmetic result is excellent.  There is no evidence of local recurrence, dehiscence, erythema, or swelling.  Both axillae are benign.  LAB RESULTS:  CMP     Component Value Date/Time   NA 141 01/14/2018 1150   NA 138 09/01/2017 1238   K 3.8 01/14/2018 1150   K 4.4 09/01/2017 1238   CL 107 01/14/2018 1150   CO2 28 01/14/2018 1150   CO2 25 09/01/2017 1238   GLUCOSE 88 01/14/2018 1150   GLUCOSE 83 09/01/2017 1238   BUN 16 01/14/2018 1150   BUN 9.2 09/01/2017 1238   CREATININE 0.62 01/14/2018 1150   CREATININE 0.7 09/01/2017 1238   CALCIUM 9.4 01/14/2018 1150   CALCIUM 9.4 09/01/2017 1238   PROT 6.5 01/14/2018 1150   PROT 7.4 09/01/2017 1238   ALBUMIN 3.8 01/14/2018 1150   ALBUMIN 4.2 09/01/2017 1238   AST 19 01/14/2018 1150   AST 21 09/01/2017 1238   ALT 21 01/14/2018 1150   ALT 21 09/01/2017 1238   ALKPHOS 60 01/14/2018 1150   ALKPHOS 60 09/01/2017 1238   BILITOT 0.5 01/14/2018 1150   BILITOT 0.65 09/01/2017 1238   GFRNONAA >60 01/14/2018 1150   GFRNONAA 111 05/18/2017 0928   GFRAA >60 01/14/2018 1150   GFRAA 128 05/18/2017 0928    No results found for:  TOTALPROTELP, ALBUMINELP, A1GS, A2GS, BETS, BETA2SER, GAMS, MSPIKE, SPEI  No results found for: KPAFRELGTCHN, LAMBDASER, KAPLAMBRATIO  Lab Results  Component Value Date   WBC 6.5 01/14/2018   NEUTROABS 4.4 01/14/2018  HGB 11.6 01/14/2018   HCT 34.5 (L) 01/14/2018   MCV 98.1 01/14/2018   PLT 289 01/14/2018      Chemistry      Component Value Date/Time   NA 141 01/14/2018 1150   NA 138 09/01/2017 1238   K 3.8 01/14/2018 1150   K 4.4 09/01/2017 1238   CL 107 01/14/2018 1150   CO2 28 01/14/2018 1150   CO2 25 09/01/2017 1238   BUN 16 01/14/2018 1150   BUN 9.2 09/01/2017 1238   CREATININE 0.62 01/14/2018 1150   CREATININE 0.7 09/01/2017 1238      Component Value Date/Time   CALCIUM 9.4 01/14/2018 1150   CALCIUM 9.4 09/01/2017 1238   ALKPHOS 60 01/14/2018 1150   ALKPHOS 60 09/01/2017 1238   AST 19 01/14/2018 1150   AST 21 09/01/2017 1238   ALT 21 01/14/2018 1150   ALT 21 09/01/2017 1238   BILITOT 0.5 01/14/2018 1150   BILITOT 0.65 09/01/2017 1238       No results found for: LABCA2  No components found for: EGBTDV761  No results for input(s): INR in the last 168 hours.  No results found for: LABCA2  No results found for: YWV371  No results found for: GGY694  No results found for: WNI627  No results found for: CA2729  No components found for: HGQUANT  No results found for: CEA1 / No results found for: CEA1   No results found for: AFPTUMOR  No results found for: CHROMOGRNA  No results found for: PSA1  No visits with results within 3 Day(s) from this visit.  Latest known visit with results is:  Appointment on 01/14/2018  Component Date Value Ref Range Status  . WBC Count 01/14/2018 6.5  3.9 - 10.3 K/uL Final  . RBC 01/14/2018 3.52* 3.70 - 5.45 MIL/uL Final  . Hemoglobin 01/14/2018 11.6  11.6 - 15.9 g/dL Final  . HCT 01/14/2018 34.5* 34.8 - 46.6 % Final  . MCV 01/14/2018 98.1  79.5 - 101.0 fL Final  . MCH 01/14/2018 32.9  25.1 - 34.0 pg Final  .  MCHC 01/14/2018 33.5  31.5 - 36.0 g/dL Final  . RDW 01/14/2018 15.0* 11.2 - 14.5 % Final  . Platelet Count 01/14/2018 289  145 - 400 K/uL Final  . Neutrophils Relative % 01/14/2018 67  % Final  . Neutro Abs 01/14/2018 4.4  1.5 - 6.5 K/uL Final  . Lymphocytes Relative 01/14/2018 22  % Final  . Lymphs Abs 01/14/2018 1.4  0.9 - 3.3 K/uL Final  . Monocytes Relative 01/14/2018 8  % Final  . Monocytes Absolute 01/14/2018 0.5  0.1 - 0.9 K/uL Final  . Eosinophils Relative 01/14/2018 2  % Final  . Eosinophils Absolute 01/14/2018 0.2  0.0 - 0.5 K/uL Final  . Basophils Relative 01/14/2018 1  % Final  . Basophils Absolute 01/14/2018 0.1  0.0 - 0.1 K/uL Final   Performed at [facility], [place] [address], [place], [place] [address]  . Sodium 01/14/2018 141  136 - 145 mmol/L Final  . Potassium 01/14/2018 3.8  3.5 - 5.1 mmol/L Final  . Chloride 01/14/2018 107  98 - 109 mmol/L Final  . CO2 01/14/2018 28  22 - 29 mmol/L Final  . Glucose, Bld 01/14/2018 88  70 - 140 mg/dL Final  . BUN 01/14/2018 16  7 - 26 mg/dL Final  . Creatinine 01/14/2018 0.62  0.60 - 1.10 mg/dL Final  . Calcium 01/14/2018 9.4  8.4 - 10.4 mg/dL  Final  . Total Protein 01/14/2018 6.5  6.4 - 8.3 g/dL Final  . Albumin 01/14/2018 3.8  3.5 - 5.0 g/dL Final  . AST 01/14/2018 19  5 - 34 U/L Final  . ALT 01/14/2018 21  0 - 55 U/L Final  . Alkaline Phosphatase 01/14/2018 60  40 - 150 U/L Final  . Total Bilirubin 01/14/2018 0.5  0.2 - 1.2 mg/dL Final  . GFR, Est Non Af Am 01/14/2018 >60  >60 mL/min Final  . GFR, Est AFR Am 01/14/2018 >60  >60 mL/min Final   Comment: (NOTE) The eGFR has been calculated using the CKD EPI equation. This calculation has not been validated in all clinical situations. eGFR's persistently <60 mL/min signify possible Chronic Kidney Disease.   Georgiann Hahn gap 01/14/2018 6  3 - 11 Final   Performed at [facility], [place] [address] [place], [place] [address]    (this  displays the last labs from the last 3 days)  No results found for: TOTALPROTELP, ALBUMINELP, A1GS, A2GS, BETS, BETA2SER, GAMS, MSPIKE, SPEI (this displays SPEP labs)  No results found for: KPAFRELGTCHN, LAMBDASER, KAPLAMBRATIO (kappa/lambda light chains)  No results found for: HGBA, HGBA2QUANT, HGBFQUANT, HGBSQUAN (Hemoglobinopathy evaluation)   No results found for: LDH  Lab Results  Component Value Date   IRON 132 12/21/2017   TIBC 305 12/21/2017   IRONPCTSAT 43 12/21/2017   (Iron and TIBC)  Lab Results  Component Value Date   FERRITIN 67 12/16/2016    Urinalysis    Component Value Date/Time   COLORURINE AMBER (A) 12/27/2017 1905   APPEARANCEUR HAZY (A) 12/27/2017 1905   LABSPEC 1.005 12/27/2017 1905   PHURINE 7.0 12/27/2017 1905   GLUCOSEU NEGATIVE 12/27/2017 1905   HGBUR NEGATIVE 12/27/2017 1905   BILIRUBINUR NEGATIVE 12/27/2017 1905   KETONESUR NEGATIVE 12/27/2017 1905   PROTEINUR NEGATIVE 12/27/2017 1905   UROBILINOGEN 0.2 12/06/2014 1529   NITRITE NEGATIVE 12/27/2017 1905   LEUKOCYTESUR TRACE (A) 12/27/2017 1905    STUDIES: Dg Chest 2 View  Result Date: 12/26/2017 CLINICAL DATA:  Sore throat and cough.  Denies fever and chills. EXAM: CHEST - 2 VIEW COMPARISON:  None. FINDINGS: The heart size and mediastinal contours are within normal limits. Port catheter tip noted in the distal SVC. Faint opacity at the right lung base seen only on the frontal view is likely due to superimposition of overlapping breast tissue. No infiltrate seen on the lateral projection. No pulmonary edema, effusion or pneumothorax. The visualized skeletal structures are unremarkable. IMPRESSION: No active cardiopulmonary disease. Electronically Signed   By: Ashley Royalty M.D.   On: 12/26/2017 21:30     ELIGIBLE FOR AVAILABLE RESEARCH PROTOCOL: No  ASSESSMENT: 46 y.o. [place] woman status post right breast upper inner quadrant biopsy 08/24/2017 for a clinical T1c N0 invasive ductal  carcinoma, triple positive, with an MIB-1 of 60%  (1) genetics testing 09/10/2017 through the Common Hereditary Cancer Panel + Melanoma Panel found no deleterious mutations in: APC, ATM, AXIN2, BAP1, BARD1, BMPR1A, BRCA1, BRCA2, BRIP1, CDH1, CDK4, CDKN2A (p14ARF), CDKN2A (p16INK4a), CHEK2, CTNNA1, DICER1, EPCAM*, GREM1*, KIT, MEN1, MLH1, MSH2, MSH3, MSH6, MUTYH, NBN, NF1, PALB2, PDGFRA, PMS2, POLD1, POLE, POT1, PTEN, RAD50, RAD51C, RAD51D, RB1, SDHB, SDHC, SDHD, SMAD4, SMARCA4, STK11, TP53, TSC1, TSC2, VHL. The following genes were evaluated for sequence changes only: HOXB13*, MITF*, NTHL1*, SDHA  (2) status post right lumpectomy and sentinel axillary lymph node sampling 10/07/2017 [facility] for a pT1C pN0, stage IA invasive ductal carcinoma,  grade 3, with negative margins  (a) 1 sentinel lymph node removed, bilateral oncoplastic breast reduction  (3) adjuvant chemotherapy consisting of paclitaxel weekly x12 and trastuzumab every 21 days STARTING 11/12/2017  (a) paclitaxel discontinued after 7 doses (last dose 12/24/2017) due to neuropathy  (4) continue trastuzumab to complete a year (through February 2020).  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  She is tolerating the trastuzumab without any side effects and she is already scheduled for an echocardiogram in June.  She does very well with the go [place].  She does have some hot flashes and they do wake her up at night.  I think she would benefit from gabapentin at bedtime and I have placed that order in for her.  She has a good understanding of the possible toxicities, side effects and  complications of this agent, but those should be minimal at this dose.  The plan is for her to go on anastrozole when she completes radiation.  She is adamant that she does not want to go on tamoxifen.  That means were going to have to continue the goserelin potentially the entire 5 years of her antiestrogen therapy.  She does tolerated well.  At this point that would be her choice.  She is going to see me again in July.  She should be done with radiation at that time.  She knows to call for any other issues that may develop before the next visit    Hannah Woodard, Virgie Dad, MD  01/21/18 12:00 PM Medical Oncology and Hematology [facility] [place] [place], [place] [address] Tel. [phone]    Fax. [phone]  This document serves as a record of services personally performed by Lurline Del, MD. It was created on his behalf by Sheron Nightingale, a trained medical scribe. The creation of this record is based on the scribe's personal observations and the provider's statements to them.   I have reviewed the above documentation for accuracy and completeness, and I agree with the above.

## 2018-01-21 ENCOUNTER — Ambulatory Visit: Payer: BC Managed Care – PPO

## 2018-01-21 ENCOUNTER — Inpatient Hospital Stay (HOSPITAL_BASED_OUTPATIENT_CLINIC_OR_DEPARTMENT_OTHER): Payer: BC Managed Care – PPO | Admitting: Oncology

## 2018-01-21 ENCOUNTER — Telehealth: Payer: Self-pay | Admitting: Oncology

## 2018-01-21 ENCOUNTER — Other Ambulatory Visit: Payer: BC Managed Care – PPO

## 2018-01-21 VITALS — BP 118/76 | HR 75 | Temp 98.6°F | Resp 19 | Ht 66.0 in | Wt 146.6 lb

## 2018-01-21 DIAGNOSIS — C50211 Malignant neoplasm of upper-inner quadrant of right female breast: Secondary | ICD-10-CM

## 2018-01-21 DIAGNOSIS — Z95828 Presence of other vascular implants and grafts: Secondary | ICD-10-CM

## 2018-01-21 DIAGNOSIS — Z9221 Personal history of antineoplastic chemotherapy: Secondary | ICD-10-CM

## 2018-01-21 DIAGNOSIS — R5383 Other fatigue: Secondary | ICD-10-CM

## 2018-01-21 DIAGNOSIS — Z79899 Other long term (current) drug therapy: Secondary | ICD-10-CM | POA: Diagnosis not present

## 2018-01-21 DIAGNOSIS — T451X5S Adverse effect of antineoplastic and immunosuppressive drugs, sequela: Secondary | ICD-10-CM

## 2018-01-21 DIAGNOSIS — R197 Diarrhea, unspecified: Secondary | ICD-10-CM | POA: Diagnosis not present

## 2018-01-21 DIAGNOSIS — Z8041 Family history of malignant neoplasm of ovary: Secondary | ICD-10-CM | POA: Diagnosis not present

## 2018-01-21 DIAGNOSIS — R232 Flushing: Secondary | ICD-10-CM

## 2018-01-21 DIAGNOSIS — Z79818 Long term (current) use of other agents affecting estrogen receptors and estrogen levels: Secondary | ICD-10-CM | POA: Diagnosis not present

## 2018-01-21 DIAGNOSIS — Z170 Estrogen receptor positive status [ER+]: Secondary | ICD-10-CM | POA: Diagnosis not present

## 2018-01-21 DIAGNOSIS — Z803 Family history of malignant neoplasm of breast: Secondary | ICD-10-CM | POA: Diagnosis not present

## 2018-01-21 DIAGNOSIS — Z8042 Family history of malignant neoplasm of prostate: Secondary | ICD-10-CM

## 2018-01-21 DIAGNOSIS — G620 Drug-induced polyneuropathy: Secondary | ICD-10-CM | POA: Diagnosis not present

## 2018-01-21 MED ORDER — GOSERELIN ACETATE 3.6 MG ~~LOC~~ IMPL
3.6000 mg | DRUG_IMPLANT | Freq: Once | SUBCUTANEOUS | Status: AC
  Administered 2018-01-21: 3.6 mg via SUBCUTANEOUS

## 2018-01-21 MED ORDER — GOSERELIN ACETATE 3.6 MG ~~LOC~~ IMPL
DRUG_IMPLANT | SUBCUTANEOUS | Status: AC
  Filled 2018-01-21: qty 3.6

## 2018-01-21 MED ORDER — GABAPENTIN 300 MG PO CAPS: 300.0000 mg | ORAL_CAPSULE | Freq: Every day | ORAL | 4 refills | Status: DC

## 2018-01-21 NOTE — Telephone Encounter (Signed)
Gave patient AVs and calendar of upcoming April through July appointments.

## 2018-01-21 NOTE — Patient Instructions (Signed)
Goserelin injection What is this medicine? GOSERELIN (GOE se rel in) is similar to a hormone found in the body. It lowers the amount of sex hormones that the body makes. Men will have lower testosterone levels and women will have lower estrogen levels while taking this medicine. In men, this medicine is used to treat prostate cancer; the injection is either given once per month or once every 12 weeks. A once per month injection (only) is used to treat women with endometriosis, dysfunctional uterine bleeding, or advanced breast cancer. This medicine may be used for other purposes; ask your health care provider or pharmacist if you have questions. COMMON BRAND NAME(S): Zoladex What should I tell my health care provider before I take this medicine? They need to know if you have any of these conditions (some only apply to women): -diabetes -heart disease or previous heart attack -high blood pressure -high cholesterol -kidney disease -osteoporosis or low bone density -problems passing urine -spinal cord injury -stroke -tobacco smoker -an unusual or allergic reaction to goserelin, hormone therapy, other medicines, foods, dyes, or preservatives -pregnant or trying to get pregnant -breast-feeding How should I use this medicine? This medicine is for injection under the skin. It is given by a health care professional in a hospital or clinic setting. Men receive this injection once every 4 weeks or once every 12 weeks. Women will only receive the once every 4 weeks injection. Talk to your pediatrician regarding the use of this medicine in children. Special care may be needed. Overdosage: If you think you have taken too much of this medicine contact a poison control center or emergency room at once. NOTE: This medicine is only for you. Do not share this medicine with others. What if I miss a dose? It is important not to miss your dose. Call your doctor or health care professional if you are unable to  keep an appointment. What may interact with this medicine? -female hormones like estrogen -herbal or dietary supplements like black cohosh, chasteberry, or DHEA -female hormones like testosterone -prasterone This list may not describe all possible interactions. Give your health care provider a list of all the medicines, herbs, non-prescription drugs, or dietary supplements you use. Also tell them if you smoke, drink alcohol, or use illegal drugs. Some items may interact with your medicine. What should I watch for while using this medicine? Visit your doctor or health care professional for regular checks on your progress. Your symptoms may appear to get worse during the first weeks of this therapy. Tell your doctor or healthcare professional if your symptoms do not start to get better or if they get worse after this time. Your bones may get weaker if you take this medicine for a long time. If you smoke or frequently drink alcohol you may increase your risk of bone loss. A family history of osteoporosis, chronic use of drugs for seizures (convulsions), or corticosteroids can also increase your risk of bone loss. Talk to your doctor about how to keep your bones strong. This medicine should stop regular monthly menstration in women. Tell your doctor if you continue to [facility]. Women should not become pregnant while taking this medicine or for 12 weeks after stopping this medicine. Women should inform their doctor if they wish to become pregnant or think they might be pregnant. There is a potential for serious side effects to an unborn child. Talk to your health care professional or pharmacist for more information. Do not breast-feed an infant while taking  this medicine. Men should inform their doctors if they wish to father a child. This medicine may lower sperm counts. Talk to your health care professional or pharmacist for more information. What side effects may I notice from receiving this  medicine? Side effects that you should report to your doctor or health care professional as soon as possible: -allergic reactions like skin rash, itching or hives, swelling of the face, lips, or tongue -bone pain -breathing problems -changes in vision -chest pain -feeling faint or lightheaded, falls -fever, chills -pain, swelling, warmth in the leg -pain, tingling, numbness in the hands or feet -signs and symptoms of low blood pressure like dizziness; feeling faint or lightheaded, falls; unusually weak or tired -stomach pain -swelling of the ankles, feet, hands -trouble passing urine or change in the amount of urine -unusually high or low blood pressure -unusually weak or tired Side effects that usually do not require medical attention (report to your doctor or health care professional if they continue or are bothersome): -change in sex drive or performance -changes in breast size in both males and females -changes in emotions or moods -headache -hot flashes -irritation at site where injected -loss of appetite -skin problems like acne, dry skin -vaginal dryness This list may not describe all possible side effects. Call your doctor for medical advice about side effects. You may report side effects to FDA at 1-800-FDA-1088. Where should I keep my medicine? This drug is given in a hospital or clinic and will not be stored at home. NOTE: This sheet is a summary. It may not cover all possible information. If you have questions about this medicine, talk to your doctor, pharmacist, or health care provider.  2018 Elsevier/Gold Standard (2013-11-21 11:10:35)

## 2018-01-21 NOTE — Addendum Note (Signed)
Addended by: Juliane Poot on: 01/21/2018 12:43 PM   Modules accepted: Orders

## 2018-01-24 NOTE — Progress Notes (Signed)
Location of Breast Cancer:upper-inner quadrant of right breast in female  Histology per Pathology Report:  Diagnosis 08-24-17 Breast, right, needle core biopsy, mass, 1:00 o'clock, 10cmfn - INVASIVE DUCTAL CARCINOMA, GRADE 2.  Receptor  Status: ER(95 % +), PR (100 % +), Her2-neu (+), Ki-67(60%)    Did patient present with symptoms (if so, please note symptoms) or was this found on screening mammography?: She had a recent mammogram that revealed a new mass.    Past/Anticipated interventions by surgeon, if any: Angelina Ok , MD [facility]  10-07-17 Right lumpectomy  FINAL PATHOLOGIC DIAGNOSIS MICROSCOPIC EXAMINATION AND DIAGNOSIS  A.RIGHT AXILLA SENTINEL LYMPH NODE #1, EXCISION: Negative for metastatic carcinoma (0/1).  B.RIGHT BREAST, LUMPECTOMY:  Invasive mammary carcinoma of no special type (ductal), grade 3 of 3. Ductal carcinoma in situ, high nuclear grade. Margins negative. Benign fibrocystic changes with focal columnar cell change.  C.SUPERIOR MARGIN RIGHT BREAST: Benign breast tissue.  D.INFERIOR MARGIN RIGHT BREAST:  Benign breast tissue.  E.LATERAL MARGIN RIGHT BREAST: Benign soft tissue.  F.ANTERIOR MARGIN RIGHT BREAST: Benign breast tissue.  G.RIGHT BREAST TISSUE: Benign skin and breast tissue.  H.LEFT BREAST TISSUE: Benign skin and breast tissue.  COMMENT:  [employer] Surgical Pathology Cancer Case Summary:  INVASIVE CARCINOMA OF THE BREAST 10-07-17 Port-A-Cath placement 10-07-17 Dr. Morley Kos Bilateral oncoplastic breast reduction   Past/Anticipated interventions by medical oncology:  Adjuvant chemotherapy consisting of paclitaxel weekly x12 and trastuzumab every 21 days STARTING 11/12/2017             (a) paclitaxel discontinued after 7 doses (last dose 12/24/2017) due to neuropathy continue  trastuzumab to complete a year (through February 2020).    Anti-HER-2  immunotherapy, goserelin  for fertility preservation started 09/29/2017   09-10-17 Genetics testing           adjuvant radiation to follow chemo therapy   adjuvant antiestrogens to start at the         completion of local treatment   Lymphedema issues, if any:  No   Skin to right breast healing without signs of infection.    Follow up visit with Dr.  Angelina Ok , [facility] 11-03-17 will see again 05-04-18 Dr. Antoine Poche for bilateral oncoplastic breast reductions will see 01-31-18  Pain issues, if any:3/10 right breast not taking any pain medication    SAFETY ISSUES:  Prior radiation? No  Pacemaker/ICD? :No  Possible current pregnancy?:No  Is the patient on methotrexate? :No  Menarche 12 , G 0 P0,  LMP 10-05-17  BC Yes, Menopause N/A  HRT N/A  Current Complaints / other details: Ovarian cancer mother, prostate cancer father, family history of uterine cancer, family history of skin cancer,Basal cell carcinoma brother    Wt Readings from Last 3 Encounters:  01/27/18 148 lb 12.8 oz (67.5 kg)  01/21/18 146 lb 9.6 oz (66.5 kg)  12/31/17 147 lb (66.7 kg)  BP 121/70 (BP Location: Left Arm, Patient Position: Sitting, Cuff Size: Normal)   Pulse 70   Temp 98.5 F (36.9 C)   Resp 18   Ht 5' 6"  (1.676 m)   Wt 148 lb 12.8 oz (67.5 kg)   LMP 10/05/2017   SpO2 100%   BMI 24.02 kg/m   Georgena Spurling, RN 01/24/2018,3:33 PM

## 2018-01-25 ENCOUNTER — Ambulatory Visit: Payer: BC Managed Care – PPO

## 2018-01-25 ENCOUNTER — Other Ambulatory Visit: Payer: BC Managed Care – PPO

## 2018-01-26 NOTE — Progress Notes (Addendum)
Radiation Oncology         [phone] ________________________________  Name: Hannah Woodard        MRN: [id number]  Date of Service: 01/27/2018 DOB: [birth year]  CC:Unk Pinto, MD  Magrinat, Virgie Dad, MD     REFERRING PHYSICIAN: Magrinat, Virgie Dad, MD   DIAGNOSIS: The encounter diagnosis was Malignant neoplasm of upper-inner quadrant of right breast in female, estrogen receptor positive ([place]).   HISTORY OF PRESENT ILLNESS: Hannah Woodard is a 47 y.o. female seen in the multidisciplinary breast clinic for a new diagnosis of right breast cancer. The patient was noted to have a cyst that had been followed in surveillance with diagnostic mammography. She had a recent mammogram that revealed a new mass in the upper inner quadrant that was posterior. The mass measured 1.4 cm at the 1:00 position, and her ultrasound was negative by ultrasound for any adenopathy. She did have concern though for possible pectoralis invasion. She underwent a biopsy of this mass on 08/24/17 which revealed a grade 2 invasive ductal carcinoma, triple positive, with a HER2 amplification of 2.85 and Ki 67 of 60%. She underwent lumpectomy on 10/07/17 at [facility] which included reduction bilaterally. Final pathology revealed a grade 3, triple T1cN0 lesion, and no disease was noted in the lymph node she had removed. 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 She comes today to discuss options of local radiation treatment.   PREVIOUS RADIATION THERAPY: No   PAST MEDICAL HISTORY:  Past Medical History:  Diagnosis Date  . Breast cancer ([place])   . Family history of ovarian cancer 09/02/2017  . Family history of prostate cancer in father 09/02/2017  . Family history of uterine cancer 09/02/2017  . Plantar fasciitis        PAST SURGICAL HISTORY: Past Surgical History:  Procedure  Laterality Date  . right lumpectomy, sentinel node biopsy, and bilateral mammoplasty  10/2017   [facility]     FAMILY HISTORY:  Family History  Problem Relation Age of Onset  . Ovarian cancer Mother 75  . Prostate cancer Father 82       'high gleason' unsure number  . Ulcerative colitis Sister   . Other Sister        abn ovaian growth, partial hysterectomy  . Uterine cancer Maternal Aunt 74  . Stroke Maternal Aunt 66  . Basal cell carcinoma Brother   . Skin cancer Paternal Uncle   . Skin cancer Maternal Grandmother   . Stroke Paternal Grandfather 78  . Hydrocephalus Cousin      SOCIAL HISTORY:  reports that she has never smoked. She has never used smokeless tobacco. She reports that she drinks alcohol. She reports that she does not use drugs. The patient is married and lives in [place]. She works for [employer].     ALLERGIES: Adhesive [tape]; Milk-related compounds; and Poison ivy extract [poison ivy extract]   MEDICATIONS:  Current Outpatient Medications  Medication Sig Dispense Refill  . Ascorbic Acid (VITAMIN C PO) Take 400 Units by mouth 2 (two) times daily.    Marland Kitchen b complex vitamins tablet Take 1 tablet by mouth daily.    . benzonatate (TESSALON PERLES) 100 MG capsule Take 1 capsule (100 mg total) by mouth 3 (three) times daily as needed for cough (Max: 669m per day). 60 capsule 0  . Cholecalciferol (VITAMIN D3) 5000 units TABS Take  10,000 Units by mouth daily.    . fish oil-omega-3 fatty acids 1000 MG capsule Take 1 g by mouth daily.    Marland Kitchen gabapentin (NEURONTIN) 300 MG capsule Take 1 capsule (300 mg total) by mouth at bedtime. 90 capsule 4  . OVER THE COUNTER MEDICATION Take 1 tablet by mouth at bedtime as needed (constipation).     . TURMERIC PO Take 1 capsule by mouth as directed.     Marland Kitchen UNABLE TO FIND Take 500 mg by mouth daily. [place] T[place] mushroom    . UNABLE TO FIND Take 500 mg by mouth daily. [place] Mushroom    . UNABLE TO FIND Take 5 mg by mouth as  directed. CBD Oil; patient takes for nausea     . anastrozole (ARIMIDEX) 1 MG tablet Take 1 tablet (1 mg total) by mouth daily. (Patient not taking: Reported on 01/27/2018) 30 tablet 6  . ECHINACEA PO Take 1 capsule by mouth as directed.     Marland Kitchen guaiFENesin (MUCINEX) 600 MG 12 hr tablet Take 600 mg by mouth 2 (two) times daily.    . Iodine Strong, Lugols, (STRONG IODINE) 5 % solution Take 0.2 mLs by mouth 3 (three) times daily. (Patient not taking: Reported on 01/27/2018) 480 mL 0  . ketoconazole (NIZORAL) 2 % cream Apply 1 application topically daily. (Patient not taking: Reported on 01/27/2018) 15 g 0  . lidocaine (XYLOCAINE) 2 % solution Use as directed 20 mLs in the mouth or throat every 6 (six) hours as needed for mouth pain.     . magic mouthwash w/lidocaine SOLN Take 5 mLs by mouth 4 (four) times daily as needed for mouth pain. Swish and spit (Patient not taking: Reported on 01/27/2018) 240 mL 1  . ondansetron (ZOFRAN ODT) 8 MG disintegrating tablet 10m ODT q6 hours prn nausea (Patient not taking: Reported on 01/27/2018) 30 tablet 0  . promethazine-dextromethorphan (PROMETHAZINE-DM) 6.25-15 MG/5ML syrup Take 5 mLs by mouth 4 (four) times daily as needed for cough. (Patient not taking: Reported on 01/27/2018) 240 mL 1  . triamcinolone ointment (KENALOG) 0.1 % Apply 1 application topically 2 (two) times daily. (Patient not taking: Reported on 01/27/2018) 80 g 1  . Zinc Acetate 50 MG CAPS Take 1 capsule by mouth every other day.      No current facility-administered medications for this encounter.      REVIEW OF SYSTEMS: On review of systems, the patient reports that she is doing well overall. She is pleased with her surgical outcome. She denies any chest pain, shortness of breath, cough, fevers, chills, night sweats, unintended weight changes. She denies any bowel or bladder disturbances, and denies abdominal pain, nausea or vomiting. She denies any new musculoskeletal or joint aches or pains. A complete  review of systems is obtained and is otherwise negative.     PHYSICAL EXAM:  Wt Readings from Last 3 Encounters:  01/27/18 148 lb 12.8 oz (67.5 kg)  01/21/18 146 lb 9.6 oz (66.5 kg)  12/31/17 147 lb (66.7 kg)   Temp Readings from Last 3 Encounters:  01/27/18 98.5 F (36.9 C)  01/21/18 98.6 F (37 C) (Oral)  01/14/18 98.1 F (36.7 C) (Oral)   BP Readings from Last 3 Encounters:  01/27/18 121/70  01/21/18 118/76  01/14/18 124/74   Pulse Readings from Last 3 Encounters:  01/27/18 70  01/21/18 75  01/14/18 66    In general this is a well appearing caucasian female in no acute distress. She is alert and  oriented x4 and appropriate throughout the examination. HEENT reveals that the patient is normocephalic, atraumatic. Cardiopulmonary assessment is negative for acute distress and she exhibits normal effort. Her right breast is well healed with intact mammoplasty incisions. There is mild fullness along the upper inner quadrant of the breast consistent with post surgical induration.   ECOG = 0  0 - Asymptomatic (Fully active, able to carry on all predisease activities without restriction)  1 - Symptomatic but completely ambulatory (Restricted in physically strenuous activity but ambulatory and able to carry out work of a light or sedentary nature. For example, light housework, office work)  2 - Symptomatic, <50% in bed during the day (Ambulatory and capable of all self care but unable to carry out any work activities. Up and about more than 50% of waking hours)  3 - Symptomatic, >50% in bed, but not bedbound (Capable of only limited self-care, confined to bed or chair 50% or more of waking hours)  4 - Bedbound (Completely disabled. Cannot carry on any self-care. Totally confined to bed or chair)  5 - Death   Eustace Pen MM, Creech RH, Tormey DC, et al. [phone]). "Toxicity and response criteria of the [facility]". [place] Oncol. 5 (6): 649-55    LABORATORY  DATA:  Lab Results  Component Value Date   WBC 6.5 01/14/2018   HGB 11.6 01/14/2018   HCT 34.5 (L) 01/14/2018   MCV 98.1 01/14/2018   PLT 289 01/14/2018   Lab Results  Component Value Date   NA 141 01/14/2018   K 3.8 01/14/2018   CL 107 01/14/2018   CO2 28 01/14/2018   Lab Results  Component Value Date   ALT 21 01/14/2018   AST 19 01/14/2018   ALKPHOS 60 01/14/2018   BILITOT 0.5 01/14/2018      RADIOGRAPHY: No results found.     IMPRESSION/PLAN: 1. 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 Dr. Lisbeth Renshaw discusses the pathology findings and reviews the nature of invasive triple positive breast disease. The patient is ready to proceed with local radiotherapy to the breast. We discussed the risks, benefits, short, and long term effects of radiotherapy. Dr. Lisbeth Renshaw discusses the delivery and logistics of radiotherapy and anticipates a course of 6 1/2 weeks. Written consent is obtained and placed in the chart, a copy was provided to the patient. She will be contacted by our staff for simulation. 2. Contraceptive counseling. Dr. Jana Hakim has the patient on Jackquline [place] and has not had a cycle since treatment began.   In a visit lasting 25 minutes, greater than 50% of the time was spent face to face discussing her case, and coordinating the patient's care.   The above documentation reflects my direct findings during this shared patient visit. Please see the separate note by Dr. Lisbeth Renshaw on this date for the remainder of the patient's plan of care.    Carola Rhine, PAC

## 2018-01-27 ENCOUNTER — Other Ambulatory Visit: Payer: Self-pay

## 2018-01-27 ENCOUNTER — Ambulatory Visit
Admission: RE | Admit: 2018-01-27 | Discharge: 2018-01-27 | Disposition: A | Discharge status: Home / Self Care | Payer: BC Managed Care – PPO | Source: Ambulatory Visit | Attending: Radiation Oncology | Admitting: Radiation Oncology

## 2018-01-27 ENCOUNTER — Encounter: Payer: Self-pay | Admitting: Radiation Oncology

## 2018-01-27 VITALS — BP 121/70 | HR 70 | Temp 98.5°F | Resp 18 | Ht 66.0 in | Wt 148.8 lb

## 2018-01-27 DIAGNOSIS — Z170 Estrogen receptor positive status [ER+]: Secondary | ICD-10-CM | POA: Insufficient documentation

## 2018-01-27 DIAGNOSIS — Z808 Family history of malignant neoplasm of other organs or systems: Secondary | ICD-10-CM | POA: Insufficient documentation

## 2018-01-27 DIAGNOSIS — G629 Polyneuropathy, unspecified: Secondary | ICD-10-CM | POA: Diagnosis not present

## 2018-01-27 DIAGNOSIS — Z803 Family history of malignant neoplasm of breast: Secondary | ICD-10-CM | POA: Diagnosis not present

## 2018-01-27 DIAGNOSIS — Z9221 Personal history of antineoplastic chemotherapy: Secondary | ICD-10-CM | POA: Diagnosis not present

## 2018-01-27 DIAGNOSIS — C50211 Malignant neoplasm of upper-inner quadrant of right female breast: Secondary | ICD-10-CM | POA: Insufficient documentation

## 2018-01-27 DIAGNOSIS — Z8042 Family history of malignant neoplasm of prostate: Secondary | ICD-10-CM | POA: Insufficient documentation

## 2018-01-27 DIAGNOSIS — Z8041 Family history of malignant neoplasm of ovary: Secondary | ICD-10-CM | POA: Insufficient documentation

## 2018-01-27 DIAGNOSIS — Z79899 Other long term (current) drug therapy: Secondary | ICD-10-CM | POA: Diagnosis not present

## 2018-01-28 ENCOUNTER — Ambulatory Visit: Payer: BC Managed Care – PPO | Admitting: Adult Health

## 2018-01-28 ENCOUNTER — Ambulatory Visit: Payer: BC Managed Care – PPO

## 2018-01-28 ENCOUNTER — Other Ambulatory Visit: Payer: BC Managed Care – PPO

## 2018-02-01 ENCOUNTER — Ambulatory Visit
Admission: RE | Admit: 2018-02-01 | Discharge: 2018-02-01 | Disposition: A | Discharge status: Home / Self Care | Payer: BC Managed Care – PPO | Source: Ambulatory Visit | Attending: Radiation Oncology | Admitting: Radiation Oncology

## 2018-02-01 DIAGNOSIS — C50211 Malignant neoplasm of upper-inner quadrant of right female breast: Secondary | ICD-10-CM | POA: Insufficient documentation

## 2018-02-01 DIAGNOSIS — Z510 Encounter for antineoplastic radiation therapy: Secondary | ICD-10-CM | POA: Insufficient documentation

## 2018-02-01 DIAGNOSIS — Z170 Estrogen receptor positive status [ER+]: Secondary | ICD-10-CM | POA: Diagnosis not present

## 2018-02-02 DIAGNOSIS — C50211 Malignant neoplasm of upper-inner quadrant of right female breast: Secondary | ICD-10-CM | POA: Diagnosis not present

## 2018-02-02 NOTE — Progress Notes (Signed)
  Radiation Oncology         [id number]) [phone] ________________________________  Name: Hannah Woodard MRN: [id number]  Date: 02/01/2018  DOB: [birth year]  Optical Surface Tracking Plan:  Since intensity modulated radiotherapy (IMRT) and 3D conformal radiation treatment methods are predicated on accurate and precise positioning for treatment, intrafraction motion monitoring is medically necessary to ensure accurate and safe treatment delivery.  The ability to quantify intrafraction motion without excessive ionizing radiation dose can only be performed with optical surface tracking. Accordingly, surface imaging offers the opportunity to obtain 3D measurements of patient position throughout IMRT and 3D treatments without excessive radiation exposure.  I am ordering optical surface tracking for this patient's upcoming course of radiotherapy. ________________________________  Kyung Rudd, MD 02/02/2018 12:47 PM    Reference:   Ursula Alert, J, et al. Surface imaging-based analysis of intrafraction motion for breast radiotherapy patients.Journal of [place], n. 6, nov. 2014. ISSN [id number].   Available at: <http://www.jacmp.org/index.php/jacmp/article/view/4957>.

## 2018-02-02 NOTE — Progress Notes (Signed)
  Radiation Oncology         [phone]) [phone] ________________________________  Name: ODESSA NISHI MRN: [id number]  Date: 02/01/2018  DOB: 03/11/1971  DIAGNOSIS:     ICD-10-CM   1. Malignant neoplasm of upper-inner quadrant of right breast in female, estrogen receptor positive ([place]) C50.211    Z17.0      SIMULATION AND TREATMENT PLANNING NOTE  The patient presented for simulation prior to beginning her course of radiation treatment for her diagnosis of right-sided breast cancer. The patient was placed in a supine position on a breast board. A customized vac-lock bag was constructed and this complex treatment device will be used on a daily basis during her treatment. In this fashion, a CT scan was obtained through the chest area and an isocenter was placed near the chest wall within the breast.  The patient will be planned to receive a course of radiation initially to a dose of 50.4 Gy. This will consist of a whole breast radiotherapy technique. To accomplish this, 2 customized blocks have been designed which will correspond to medial and lateral whole breast tangent fields. This treatment will be accomplished at 1.8 Gy per fraction. A forward planning technique will also be evaluated to determine if this approach improves the plan. It is anticipated that the patient will then receive a 10 Gy boost to the seroma cavity which has been contoured. This will be accomplished at 2 Gy per fraction.   This initial treatment will consist of a 3-D conformal technique. The seroma has been contoured as the primary target structure. Additionally, dose volume histograms of both this target as well as the lungs and heart will also be evaluated. Such an approach is necessary to ensure that the target area is adequately covered while the nearby critical  normal structures are adequately spared.  Plan:  The final anticipated total dose therefore will correspond to 60.4  Gy.    _______________________________   Jodelle Gross, MD, PhD

## 2018-02-03 ENCOUNTER — Ambulatory Visit: Payer: BC Managed Care – PPO | Admitting: Radiation Oncology

## 2018-02-03 ENCOUNTER — Ambulatory Visit: Payer: BC Managed Care – PPO

## 2018-02-03 ENCOUNTER — Other Ambulatory Visit: Payer: BC Managed Care – PPO

## 2018-02-03 ENCOUNTER — Ambulatory Visit
Admission: RE | Admit: 2018-02-03 | Discharge: 2018-02-03 | Disposition: A | Discharge status: Home / Self Care | Payer: BC Managed Care – PPO | Source: Ambulatory Visit | Attending: Radiation Oncology | Admitting: Radiation Oncology

## 2018-02-03 DIAGNOSIS — C50211 Malignant neoplasm of upper-inner quadrant of right female breast: Secondary | ICD-10-CM | POA: Diagnosis not present

## 2018-02-04 ENCOUNTER — Ambulatory Visit
Admission: RE | Admit: 2018-02-04 | Discharge: 2018-02-04 | Disposition: A | Discharge status: Home / Self Care | Payer: BC Managed Care – PPO | Source: Ambulatory Visit | Attending: Radiation Oncology | Admitting: Radiation Oncology

## 2018-02-04 ENCOUNTER — Other Ambulatory Visit: Payer: BC Managed Care – PPO

## 2018-02-04 ENCOUNTER — Inpatient Hospital Stay: Payer: BC Managed Care – PPO

## 2018-02-04 ENCOUNTER — Ambulatory Visit: Payer: BC Managed Care – PPO

## 2018-02-04 ENCOUNTER — Inpatient Hospital Stay: Payer: BC Managed Care – PPO | Attending: Oncology

## 2018-02-04 VITALS — BP 135/75 | HR 65 | Temp 97.7°F | Resp 18 | Wt 147.5 lb

## 2018-02-04 DIAGNOSIS — C50211 Malignant neoplasm of upper-inner quadrant of right female breast: Secondary | ICD-10-CM | POA: Diagnosis not present

## 2018-02-04 DIAGNOSIS — Z5111 Encounter for antineoplastic chemotherapy: Secondary | ICD-10-CM | POA: Insufficient documentation

## 2018-02-04 DIAGNOSIS — Z95828 Presence of other vascular implants and grafts: Secondary | ICD-10-CM

## 2018-02-04 DIAGNOSIS — Z170 Estrogen receptor positive status [ER+]: Secondary | ICD-10-CM | POA: Insufficient documentation

## 2018-02-04 LAB — CMP (CANCER CENTER ONLY)
ALBUMIN: 4.1 g/dL (ref 3.5–5.0)
ALK PHOS: 64 U/L (ref 40–150)
ALT: 20 U/L (ref 0–55)
ANION GAP: 7 (ref 3–11)
AST: 18 U/L (ref 5–34)
BILIRUBIN TOTAL: 0.5 mg/dL (ref 0.2–1.2)
BUN: 19 mg/dL (ref 7–26)
CALCIUM: 9.4 mg/dL (ref 8.4–10.4)
CO2: 27 mmol/L (ref 22–29)
Chloride: 107 mmol/L (ref 98–109)
Creatinine: 0.63 mg/dL (ref 0.60–1.10)
GLUCOSE: 92 mg/dL (ref 70–140)
POTASSIUM: 3.8 mmol/L (ref 3.5–5.1)
Sodium: 141 mmol/L (ref 136–145)
TOTAL PROTEIN: 6.7 g/dL (ref 6.4–8.3)

## 2018-02-04 LAB — CBC WITH DIFFERENTIAL (CANCER CENTER ONLY)
BASOS PCT: 0 %
Basophils Absolute: 0 10*3/uL (ref 0.0–0.1)
Eosinophils Absolute: 0.3 10*3/uL (ref 0.0–0.5)
Eosinophils Relative: 6 %
HEMATOCRIT: 35.6 % (ref 34.8–46.6)
HEMOGLOBIN: 12.1 g/dL (ref 11.6–15.9)
LYMPHS ABS: 1.3 10*3/uL (ref 0.9–3.3)
Lymphocytes Relative: 23 %
MCH: 33.2 pg (ref 25.1–34.0)
MCHC: 34 g/dL (ref 31.5–36.0)
MCV: 97.5 fL (ref 79.5–101.0)
MONO ABS: 0.4 10*3/uL (ref 0.1–0.9)
Monocytes Relative: 6 %
NEUTROS PCT: 65 %
Neutro Abs: 3.7 10*3/uL (ref 1.5–6.5)
Platelet Count: 241 10*3/uL (ref 145–400)
RBC: 3.65 MIL/uL — ABNORMAL LOW (ref 3.70–5.45)
RDW: 13.8 % (ref 11.2–14.5)
WBC: 5.6 10*3/uL (ref 3.9–10.3)

## 2018-02-04 MED ORDER — SODIUM CHLORIDE 0.9% FLUSH
10.0000 mL | INTRAVENOUS | Status: DC | PRN
  Administered 2018-02-04: 10 mL
  Filled 2018-02-04: qty 10

## 2018-02-04 MED ORDER — ALRA NON-METALLIC DEODORANT (RAD-ONC)
1.0000 "application " | Freq: Once | TOPICAL | Status: AC
  Administered 2018-02-04: 1 via TOPICAL

## 2018-02-04 MED ORDER — SODIUM CHLORIDE 0.9% FLUSH
10.0000 mL | Freq: Once | INTRAVENOUS | Status: AC
  Administered 2018-02-04: 10 mL
  Filled 2018-02-04: qty 10

## 2018-02-04 MED ORDER — DIPHENHYDRAMINE HCL 25 MG PO CAPS
ORAL_CAPSULE | ORAL | Status: AC
  Filled 2018-02-04: qty 2

## 2018-02-04 MED ORDER — TRASTUZUMAB CHEMO 150 MG IV SOLR
6.0000 mg/kg | Freq: Once | INTRAVENOUS | Status: AC
  Administered 2018-02-04: 441 mg via INTRAVENOUS
  Filled 2018-02-04: qty 21

## 2018-02-04 MED ORDER — DIPHENHYDRAMINE HCL 25 MG PO CAPS
25.0000 mg | ORAL_CAPSULE | Freq: Once | ORAL | Status: AC
  Administered 2018-02-04: 25 mg via ORAL

## 2018-02-04 MED ORDER — SODIUM CHLORIDE 0.9 % IV SOLN
Freq: Once | INTRAVENOUS | Status: AC
  Administered 2018-02-04: 13:00:00 via INTRAVENOUS

## 2018-02-04 MED ORDER — RADIAPLEXRX EX GEL
Freq: Once | CUTANEOUS | Status: AC
  Administered 2018-02-04: 16:00:00 via TOPICAL

## 2018-02-04 MED ORDER — ACETAMINOPHEN 325 MG PO TABS
650.0000 mg | ORAL_TABLET | Freq: Once | ORAL | Status: AC
  Administered 2018-02-04: 650 mg via ORAL

## 2018-02-04 MED ORDER — HEPARIN SOD (PORK) LOCK FLUSH 100 UNIT/ML IV SOLN
500.0000 [IU] | Freq: Once | INTRAVENOUS | Status: AC | PRN
  Administered 2018-02-04: 500 [IU]
  Filled 2018-02-04: qty 5

## 2018-02-04 MED ORDER — ACETAMINOPHEN 325 MG PO TABS
ORAL_TABLET | ORAL | Status: AC
  Filled 2018-02-04: qty 2

## 2018-02-04 NOTE — Patient Instructions (Signed)
[facility] Discharge Instructions for Patients Receiving Chemotherapy  Today you received the following chemotherapy agents Herceptin  To help prevent nausea and vomiting after your treatment, we encourage you to take your nausea medication as directed   If you develop nausea and vomiting that is not controlled by your nausea medication, call the clinic.   BELOW ARE SYMPTOMS THAT SHOULD BE REPORTED IMMEDIATELY:  *FEVER GREATER THAN 100.5 F  *CHILLS WITH OR WITHOUT FEVER  NAUSEA AND VOMITING THAT IS NOT CONTROLLED WITH YOUR NAUSEA MEDICATION  *UNUSUAL SHORTNESS OF BREATH  *UNUSUAL BRUISING OR BLEEDING  TENDERNESS IN MOUTH AND THROAT WITH OR WITHOUT PRESENCE OF ULCERS  *URINARY PROBLEMS  *BOWEL PROBLEMS  UNUSUAL RASH Items with * indicate a potential emergency and should be followed up as soon as possible.  Feel free to call the clinic should you have any questions or concerns. The clinic phone number is [phone].  Please show the CHEMO ALERT CARD at check-in to the Emergency Department and triage nurse.   

## 2018-02-04 NOTE — Progress Notes (Signed)
Pt here for patient teaching.  Pt given Radiation and You booklet, Alra deodorant and Radiaplex gel.  Reviewed areas of pertinence such as fatigue, hair loss, skin changes, breast tenderness and breast swelling . Pt able to give teach back of to pat skin and use unscented/gentle soap,apply Radiaplex bid, avoid applying anything to skin within 4 hours of treatment, avoid wearing an under wire bra and to use an electric razor if they must shave. Pt verbalizes understanding of information given and will contact nursing with any questions or concerns.     Cori Razor, RN

## 2018-02-07 ENCOUNTER — Ambulatory Visit
Admission: RE | Admit: 2018-02-07 | Discharge: 2018-02-07 | Disposition: A | Discharge status: Home / Self Care | Payer: BC Managed Care – PPO | Source: Ambulatory Visit | Attending: Radiation Oncology | Admitting: Radiation Oncology

## 2018-02-07 ENCOUNTER — Ambulatory Visit: Payer: BC Managed Care – PPO

## 2018-02-07 DIAGNOSIS — C50211 Malignant neoplasm of upper-inner quadrant of right female breast: Secondary | ICD-10-CM | POA: Diagnosis not present

## 2018-02-08 ENCOUNTER — Ambulatory Visit: Payer: BC Managed Care – PPO

## 2018-02-08 ENCOUNTER — Ambulatory Visit
Admission: RE | Admit: 2018-02-08 | Discharge: 2018-02-08 | Disposition: A | Discharge status: Home / Self Care | Payer: BC Managed Care – PPO | Source: Ambulatory Visit | Attending: Radiation Oncology | Admitting: Radiation Oncology

## 2018-02-08 DIAGNOSIS — C50211 Malignant neoplasm of upper-inner quadrant of right female breast: Secondary | ICD-10-CM | POA: Diagnosis not present

## 2018-02-09 ENCOUNTER — Ambulatory Visit: Payer: BC Managed Care – PPO

## 2018-02-09 ENCOUNTER — Ambulatory Visit
Admission: RE | Admit: 2018-02-09 | Discharge: 2018-02-09 | Disposition: A | Discharge status: Home / Self Care | Payer: BC Managed Care – PPO | Source: Ambulatory Visit | Attending: Radiation Oncology | Admitting: Radiation Oncology

## 2018-02-09 DIAGNOSIS — C50211 Malignant neoplasm of upper-inner quadrant of right female breast: Secondary | ICD-10-CM | POA: Diagnosis not present

## 2018-02-10 ENCOUNTER — Ambulatory Visit
Admission: RE | Admit: 2018-02-10 | Discharge: 2018-02-10 | Disposition: A | Discharge status: Home / Self Care | Payer: BC Managed Care – PPO | Source: Ambulatory Visit | Attending: Radiation Oncology | Admitting: Radiation Oncology

## 2018-02-10 ENCOUNTER — Ambulatory Visit: Payer: BC Managed Care – PPO

## 2018-02-10 DIAGNOSIS — C50211 Malignant neoplasm of upper-inner quadrant of right female breast: Secondary | ICD-10-CM | POA: Diagnosis not present

## 2018-02-11 ENCOUNTER — Ambulatory Visit: Payer: BC Managed Care – PPO

## 2018-02-11 ENCOUNTER — Ambulatory Visit
Admission: RE | Admit: 2018-02-11 | Discharge: 2018-02-11 | Disposition: A | Discharge status: Home / Self Care | Payer: BC Managed Care – PPO | Source: Ambulatory Visit | Attending: Radiation Oncology | Admitting: Radiation Oncology

## 2018-02-11 DIAGNOSIS — C50211 Malignant neoplasm of upper-inner quadrant of right female breast: Secondary | ICD-10-CM | POA: Diagnosis not present

## 2018-02-14 ENCOUNTER — Ambulatory Visit: Payer: BC Managed Care – PPO

## 2018-02-14 ENCOUNTER — Ambulatory Visit
Admission: RE | Admit: 2018-02-14 | Discharge: 2018-02-14 | Disposition: A | Discharge status: Home / Self Care | Payer: BC Managed Care – PPO | Source: Ambulatory Visit | Attending: Radiation Oncology | Admitting: Radiation Oncology

## 2018-02-14 DIAGNOSIS — C50211 Malignant neoplasm of upper-inner quadrant of right female breast: Secondary | ICD-10-CM | POA: Diagnosis not present

## 2018-02-15 ENCOUNTER — Ambulatory Visit
Admission: RE | Admit: 2018-02-15 | Discharge: 2018-02-15 | Disposition: A | Discharge status: Home / Self Care | Payer: BC Managed Care – PPO | Source: Ambulatory Visit | Attending: Radiation Oncology | Admitting: Radiation Oncology

## 2018-02-15 ENCOUNTER — Ambulatory Visit: Payer: BC Managed Care – PPO

## 2018-02-15 DIAGNOSIS — C50211 Malignant neoplasm of upper-inner quadrant of right female breast: Secondary | ICD-10-CM | POA: Diagnosis not present

## 2018-02-16 ENCOUNTER — Ambulatory Visit: Payer: BC Managed Care – PPO

## 2018-02-16 ENCOUNTER — Ambulatory Visit
Admission: RE | Admit: 2018-02-16 | Discharge: 2018-02-16 | Disposition: A | Discharge status: Home / Self Care | Payer: BC Managed Care – PPO | Source: Ambulatory Visit | Attending: Radiation Oncology | Admitting: Radiation Oncology

## 2018-02-16 DIAGNOSIS — C50211 Malignant neoplasm of upper-inner quadrant of right female breast: Secondary | ICD-10-CM | POA: Diagnosis not present

## 2018-02-17 ENCOUNTER — Ambulatory Visit: Payer: BC Managed Care – PPO

## 2018-02-17 ENCOUNTER — Ambulatory Visit
Admission: RE | Admit: 2018-02-17 | Discharge: 2018-02-17 | Disposition: A | Discharge status: Home / Self Care | Payer: BC Managed Care – PPO | Source: Ambulatory Visit | Attending: Radiation Oncology | Admitting: Radiation Oncology

## 2018-02-17 DIAGNOSIS — C50211 Malignant neoplasm of upper-inner quadrant of right female breast: Secondary | ICD-10-CM | POA: Diagnosis not present

## 2018-02-18 ENCOUNTER — Inpatient Hospital Stay: Payer: BC Managed Care – PPO

## 2018-02-18 ENCOUNTER — Ambulatory Visit
Admission: RE | Admit: 2018-02-18 | Discharge: 2018-02-18 | Disposition: A | Discharge status: Home / Self Care | Payer: BC Managed Care – PPO | Source: Ambulatory Visit | Attending: Radiation Oncology | Admitting: Radiation Oncology

## 2018-02-18 ENCOUNTER — Ambulatory Visit: Payer: BC Managed Care – PPO

## 2018-02-18 VITALS — BP 122/81 | HR 68 | Temp 97.8°F | Resp 20

## 2018-02-18 DIAGNOSIS — C50211 Malignant neoplasm of upper-inner quadrant of right female breast: Secondary | ICD-10-CM

## 2018-02-18 DIAGNOSIS — Z170 Estrogen receptor positive status [ER+]: Secondary | ICD-10-CM

## 2018-02-18 DIAGNOSIS — Z5111 Encounter for antineoplastic chemotherapy: Secondary | ICD-10-CM | POA: Diagnosis not present

## 2018-02-18 DIAGNOSIS — Z95828 Presence of other vascular implants and grafts: Secondary | ICD-10-CM

## 2018-02-18 MED ORDER — GOSERELIN ACETATE 3.6 MG ~~LOC~~ IMPL
DRUG_IMPLANT | SUBCUTANEOUS | Status: AC
  Filled 2018-02-18: qty 3.6

## 2018-02-18 MED ORDER — GOSERELIN ACETATE 3.6 MG ~~LOC~~ IMPL
3.6000 mg | DRUG_IMPLANT | Freq: Once | SUBCUTANEOUS | Status: AC
  Administered 2018-02-18: 3.6 mg via SUBCUTANEOUS

## 2018-02-18 NOTE — Patient Instructions (Signed)
Goserelin injection What is this medicine? GOSERELIN (GOE se rel in) is similar to a hormone found in the body. It lowers the amount of sex hormones that the body makes. Men will have lower testosterone levels and women will have lower estrogen levels while taking this medicine. In men, this medicine is used to treat prostate cancer; the injection is either given once per month or once every 12 weeks. A once per month injection (only) is used to treat women with endometriosis, dysfunctional uterine bleeding, or advanced breast cancer. This medicine may be used for other purposes; ask your health care provider or pharmacist if you have questions. COMMON BRAND NAME(S): Zoladex What should I tell my health care provider before I take this medicine? They need to know if you have any of these conditions (some only apply to women): -diabetes -heart disease or previous heart attack -high blood pressure -high cholesterol -kidney disease -osteoporosis or low bone density -problems passing urine -spinal cord injury -stroke -tobacco smoker -an unusual or allergic reaction to goserelin, hormone therapy, other medicines, foods, dyes, or preservatives -pregnant or trying to get pregnant -breast-feeding How should I use this medicine? This medicine is for injection under the skin. It is given by a health care professional in a hospital or clinic setting. Men receive this injection once every 4 weeks or once every 12 weeks. Women will only receive the once every 4 weeks injection. Talk to your pediatrician regarding the use of this medicine in children. Special care may be needed. Overdosage: If you think you have taken too much of this medicine contact a poison control center or emergency room at once. NOTE: This medicine is only for you. Do not share this medicine with others. What if I miss a dose? It is important not to miss your dose. Call your doctor or health care professional if you are unable to  keep an appointment. What may interact with this medicine? -female hormones like estrogen -herbal or dietary supplements like black cohosh, chasteberry, or DHEA -female hormones like testosterone -prasterone This list may not describe all possible interactions. Give your health care provider a list of all the medicines, herbs, non-prescription drugs, or dietary supplements you use. Also tell them if you smoke, drink alcohol, or use illegal drugs. Some items may interact with your medicine. What should I watch for while using this medicine? Visit your doctor or health care professional for regular checks on your progress. Your symptoms may appear to get worse during the first weeks of this therapy. Tell your doctor or healthcare professional if your symptoms do not start to get better or if they get worse after this time. Your bones may get weaker if you take this medicine for a long time. If you smoke or frequently drink alcohol you may increase your risk of bone loss. A family history of osteoporosis, chronic use of drugs for seizures (convulsions), or corticosteroids can also increase your risk of bone loss. Talk to your doctor about how to keep your bones strong. This medicine should stop regular monthly menstration in women. Tell your doctor if you continue to [facility]. Women should not become pregnant while taking this medicine or for 12 weeks after stopping this medicine. Women should inform their doctor if they wish to become pregnant or think they might be pregnant. There is a potential for serious side effects to an unborn child. Talk to your health care professional or pharmacist for more information. Do not breast-feed an infant while taking  this medicine. Men should inform their doctors if they wish to father a child. This medicine may lower sperm counts. Talk to your health care professional or pharmacist for more information. What side effects may I notice from receiving this  medicine? Side effects that you should report to your doctor or health care professional as soon as possible: -allergic reactions like skin rash, itching or hives, swelling of the face, lips, or tongue -bone pain -breathing problems -changes in vision -chest pain -feeling faint or lightheaded, falls -fever, chills -pain, swelling, warmth in the leg -pain, tingling, numbness in the hands or feet -signs and symptoms of low blood pressure like dizziness; feeling faint or lightheaded, falls; unusually weak or tired -stomach pain -swelling of the ankles, feet, hands -trouble passing urine or change in the amount of urine -unusually high or low blood pressure -unusually weak or tired Side effects that usually do not require medical attention (report to your doctor or health care professional if they continue or are bothersome): -change in sex drive or performance -changes in breast size in both males and females -changes in emotions or moods -headache -hot flashes -irritation at site where injected -loss of appetite -skin problems like acne, dry skin -vaginal dryness This list may not describe all possible side effects. Call your doctor for medical advice about side effects. You may report side effects to FDA at 1-800-FDA-1088. Where should I keep my medicine? This drug is given in a hospital or clinic and will not be stored at home. NOTE: This sheet is a summary. It may not cover all possible information. If you have questions about this medicine, talk to your doctor, pharmacist, or health care provider.  2018 Elsevier/Gold Standard (2013-11-21 11:10:35)

## 2018-02-22 ENCOUNTER — Ambulatory Visit
Admission: RE | Admit: 2018-02-22 | Discharge: 2018-02-22 | Disposition: A | Discharge status: Home / Self Care | Payer: BC Managed Care – PPO | Source: Ambulatory Visit | Attending: Radiation Oncology | Admitting: Radiation Oncology

## 2018-02-22 ENCOUNTER — Ambulatory Visit: Payer: BC Managed Care – PPO

## 2018-02-22 DIAGNOSIS — C50211 Malignant neoplasm of upper-inner quadrant of right female breast: Secondary | ICD-10-CM | POA: Diagnosis not present

## 2018-02-23 ENCOUNTER — Ambulatory Visit: Payer: BC Managed Care – PPO

## 2018-02-23 ENCOUNTER — Ambulatory Visit
Admission: RE | Admit: 2018-02-23 | Discharge: 2018-02-23 | Disposition: A | Discharge status: Home / Self Care | Payer: BC Managed Care – PPO | Source: Ambulatory Visit | Attending: Radiation Oncology | Admitting: Radiation Oncology

## 2018-02-23 DIAGNOSIS — C50211 Malignant neoplasm of upper-inner quadrant of right female breast: Secondary | ICD-10-CM | POA: Diagnosis not present

## 2018-02-24 ENCOUNTER — Ambulatory Visit: Payer: BC Managed Care – PPO

## 2018-02-24 ENCOUNTER — Ambulatory Visit
Admission: RE | Admit: 2018-02-24 | Discharge: 2018-02-24 | Disposition: A | Discharge status: Home / Self Care | Payer: BC Managed Care – PPO | Source: Ambulatory Visit | Attending: Radiation Oncology | Admitting: Radiation Oncology

## 2018-02-24 ENCOUNTER — Other Ambulatory Visit: Payer: Self-pay | Admitting: *Deleted

## 2018-02-24 DIAGNOSIS — C50211 Malignant neoplasm of upper-inner quadrant of right female breast: Secondary | ICD-10-CM | POA: Diagnosis not present

## 2018-02-24 DIAGNOSIS — Z170 Estrogen receptor positive status [ER+]: Principal | ICD-10-CM

## 2018-02-25 ENCOUNTER — Ambulatory Visit
Admission: RE | Admit: 2018-02-25 | Discharge: 2018-02-25 | Disposition: A | Discharge status: Home / Self Care | Payer: BC Managed Care – PPO | Source: Ambulatory Visit | Attending: Radiation Oncology | Admitting: Radiation Oncology

## 2018-02-25 ENCOUNTER — Ambulatory Visit: Payer: BC Managed Care – PPO

## 2018-02-25 ENCOUNTER — Inpatient Hospital Stay: Payer: BC Managed Care – PPO

## 2018-02-25 VITALS — BP 107/71 | HR 62 | Temp 98.3°F | Resp 19

## 2018-02-25 DIAGNOSIS — Z170 Estrogen receptor positive status [ER+]: Principal | ICD-10-CM

## 2018-02-25 DIAGNOSIS — C50211 Malignant neoplasm of upper-inner quadrant of right female breast: Secondary | ICD-10-CM

## 2018-02-25 DIAGNOSIS — Z5111 Encounter for antineoplastic chemotherapy: Secondary | ICD-10-CM | POA: Diagnosis not present

## 2018-02-25 LAB — CBC WITH DIFFERENTIAL (CANCER CENTER ONLY)
BASOS ABS: 0 10*3/uL (ref 0.0–0.1)
BASOS PCT: 1 %
EOS ABS: 1 10*3/uL — AB (ref 0.0–0.5)
Eosinophils Relative: 18 %
HEMATOCRIT: 36.8 % (ref 34.8–46.6)
Hemoglobin: 12.3 g/dL (ref 11.6–15.9)
Lymphocytes Relative: 15 %
Lymphs Abs: 0.9 10*3/uL (ref 0.9–3.3)
MCH: 32.5 pg (ref 25.1–34.0)
MCHC: 33.4 g/dL (ref 31.5–36.0)
MCV: 97.4 fL (ref 79.5–101.0)
MONO ABS: 0.4 10*3/uL (ref 0.1–0.9)
Monocytes Relative: 7 %
NEUTROS PCT: 59 %
Neutro Abs: 3.4 10*3/uL (ref 1.5–6.5)
Platelet Count: 213 10*3/uL (ref 145–400)
RBC: 3.78 MIL/uL (ref 3.70–5.45)
RDW: 13.7 % (ref 11.2–14.5)
WBC Count: 5.7 10*3/uL (ref 3.9–10.3)

## 2018-02-25 LAB — CMP (CANCER CENTER ONLY)
ALBUMIN: 4.2 g/dL (ref 3.5–5.0)
ALT: 22 U/L (ref 0–55)
ANION GAP: 11 (ref 3–11)
AST: 23 U/L (ref 5–34)
Alkaline Phosphatase: 58 U/L (ref 40–150)
BUN: 13 mg/dL (ref 7–26)
CHLORIDE: 105 mmol/L (ref 98–109)
CO2: 26 mmol/L (ref 22–29)
Calcium: 9.3 mg/dL (ref 8.4–10.4)
Creatinine: 0.62 mg/dL (ref 0.60–1.10)
GFR, Est AFR Am: >60 mL/min (ref >60)
GFR, Estimated: >60 mL/min (ref >60)
GLUCOSE: 85 mg/dL (ref 70–140)
POTASSIUM: 3.8 mmol/L (ref 3.5–5.1)
SODIUM: 142 mmol/L (ref 136–145)
Total Bilirubin: 0.6 mg/dL (ref 0.2–1.2)
Total Protein: 6.8 g/dL (ref 6.4–8.3)

## 2018-02-25 MED ORDER — DIPHENHYDRAMINE HCL 25 MG PO CAPS
25.0000 mg | ORAL_CAPSULE | Freq: Once | ORAL | Status: AC
  Administered 2018-02-25: 25 mg via ORAL

## 2018-02-25 MED ORDER — SODIUM CHLORIDE 0.9% FLUSH
10.0000 mL | INTRAVENOUS | Status: DC | PRN
  Administered 2018-02-25: 10 mL
  Filled 2018-02-25: qty 10

## 2018-02-25 MED ORDER — SODIUM CHLORIDE 0.9 % IJ SOLN
10.0000 mL | Freq: Once | INTRAMUSCULAR | Status: AC
  Administered 2018-02-25: 10 mL
  Filled 2018-02-25: qty 10

## 2018-02-25 MED ORDER — SODIUM CHLORIDE 0.9 % IV SOLN
Freq: Once | INTRAVENOUS | Status: AC
  Administered 2018-02-25: 14:00:00 via INTRAVENOUS

## 2018-02-25 MED ORDER — DIPHENHYDRAMINE HCL 25 MG PO CAPS
ORAL_CAPSULE | ORAL | Status: AC
  Filled 2018-02-25: qty 1

## 2018-02-25 MED ORDER — ACETAMINOPHEN 325 MG PO TABS
650.0000 mg | ORAL_TABLET | Freq: Once | ORAL | Status: AC
  Administered 2018-02-25: 650 mg via ORAL

## 2018-02-25 MED ORDER — ACETAMINOPHEN 325 MG PO TABS
ORAL_TABLET | ORAL | Status: AC
  Filled 2018-02-25: qty 2

## 2018-02-25 MED ORDER — HEPARIN SOD (PORK) LOCK FLUSH 100 UNIT/ML IV SOLN
500.0000 [IU] | Freq: Once | INTRAVENOUS | Status: AC | PRN
  Administered 2018-02-25: 500 [IU]
  Filled 2018-02-25: qty 5

## 2018-02-25 MED ORDER — TRASTUZUMAB CHEMO 150 MG IV SOLR
6.0000 mg/kg | Freq: Once | INTRAVENOUS | Status: AC
  Administered 2018-02-25: 441 mg via INTRAVENOUS
  Filled 2018-02-25: qty 21

## 2018-02-25 NOTE — Patient Instructions (Signed)
[facility] Discharge Instructions for Patients Receiving Chemotherapy  Today you received the following chemotherapy agents Herceptin  To help prevent nausea and vomiting after your treatment, we encourage you to take your nausea medication as directed   If you develop nausea and vomiting that is not controlled by your nausea medication, call the clinic.   BELOW ARE SYMPTOMS THAT SHOULD BE REPORTED IMMEDIATELY:  *FEVER GREATER THAN 100.5 F  *CHILLS WITH OR WITHOUT FEVER  NAUSEA AND VOMITING THAT IS NOT CONTROLLED WITH YOUR NAUSEA MEDICATION  *UNUSUAL SHORTNESS OF BREATH  *UNUSUAL BRUISING OR BLEEDING  TENDERNESS IN MOUTH AND THROAT WITH OR WITHOUT PRESENCE OF ULCERS  *URINARY PROBLEMS  *BOWEL PROBLEMS  UNUSUAL RASH Items with * indicate a potential emergency and should be followed up as soon as possible.  Feel free to call the clinic should you have any questions or concerns. The clinic phone number is [phone].  Please show the CHEMO ALERT CARD at check-in to the Emergency Department and triage nurse.   

## 2018-02-28 ENCOUNTER — Ambulatory Visit: Payer: BC Managed Care – PPO

## 2018-02-28 ENCOUNTER — Ambulatory Visit
Admission: RE | Admit: 2018-02-28 | Discharge: 2018-02-28 | Disposition: A | Discharge status: Home / Self Care | Payer: BC Managed Care – PPO | Source: Ambulatory Visit | Attending: Radiation Oncology | Admitting: Radiation Oncology

## 2018-02-28 DIAGNOSIS — Z8042 Family history of malignant neoplasm of prostate: Secondary | ICD-10-CM | POA: Diagnosis not present

## 2018-02-28 DIAGNOSIS — Z808 Family history of malignant neoplasm of other organs or systems: Secondary | ICD-10-CM | POA: Diagnosis not present

## 2018-02-28 DIAGNOSIS — Z510 Encounter for antineoplastic radiation therapy: Secondary | ICD-10-CM | POA: Insufficient documentation

## 2018-02-28 DIAGNOSIS — C50211 Malignant neoplasm of upper-inner quadrant of right female breast: Secondary | ICD-10-CM | POA: Diagnosis not present

## 2018-02-28 DIAGNOSIS — Z170 Estrogen receptor positive status [ER+]: Secondary | ICD-10-CM | POA: Insufficient documentation

## 2018-02-28 DIAGNOSIS — Z8041 Family history of malignant neoplasm of ovary: Secondary | ICD-10-CM | POA: Insufficient documentation

## 2018-03-01 ENCOUNTER — Ambulatory Visit: Payer: BC Managed Care – PPO

## 2018-03-01 ENCOUNTER — Ambulatory Visit
Admission: RE | Admit: 2018-03-01 | Discharge: 2018-03-01 | Disposition: A | Discharge status: Home / Self Care | Payer: BC Managed Care – PPO | Source: Ambulatory Visit | Attending: Radiation Oncology | Admitting: Radiation Oncology

## 2018-03-01 DIAGNOSIS — Z510 Encounter for antineoplastic radiation therapy: Secondary | ICD-10-CM | POA: Diagnosis not present

## 2018-03-02 ENCOUNTER — Ambulatory Visit
Admission: RE | Admit: 2018-03-02 | Discharge: 2018-03-02 | Disposition: A | Discharge status: Home / Self Care | Payer: BC Managed Care – PPO | Source: Ambulatory Visit | Attending: Radiation Oncology | Admitting: Radiation Oncology

## 2018-03-02 ENCOUNTER — Ambulatory Visit: Payer: BC Managed Care – PPO

## 2018-03-02 DIAGNOSIS — Z510 Encounter for antineoplastic radiation therapy: Secondary | ICD-10-CM | POA: Diagnosis not present

## 2018-03-03 ENCOUNTER — Ambulatory Visit: Payer: BC Managed Care – PPO

## 2018-03-03 ENCOUNTER — Ambulatory Visit
Admission: RE | Admit: 2018-03-03 | Discharge: 2018-03-03 | Disposition: A | Discharge status: Home / Self Care | Payer: BC Managed Care – PPO | Source: Ambulatory Visit | Attending: Radiation Oncology | Admitting: Radiation Oncology

## 2018-03-03 DIAGNOSIS — Z510 Encounter for antineoplastic radiation therapy: Secondary | ICD-10-CM | POA: Diagnosis not present

## 2018-03-04 ENCOUNTER — Ambulatory Visit: Payer: BC Managed Care – PPO

## 2018-03-04 ENCOUNTER — Ambulatory Visit
Admission: RE | Admit: 2018-03-04 | Discharge: 2018-03-04 | Disposition: A | Discharge status: Home / Self Care | Payer: BC Managed Care – PPO | Source: Ambulatory Visit | Attending: Radiation Oncology | Admitting: Radiation Oncology

## 2018-03-04 DIAGNOSIS — Z510 Encounter for antineoplastic radiation therapy: Secondary | ICD-10-CM | POA: Diagnosis not present

## 2018-03-07 ENCOUNTER — Ambulatory Visit
Admission: RE | Admit: 2018-03-07 | Discharge: 2018-03-07 | Disposition: A | Discharge status: Home / Self Care | Payer: BC Managed Care – PPO | Source: Ambulatory Visit | Attending: Radiation Oncology | Admitting: Radiation Oncology

## 2018-03-07 ENCOUNTER — Ambulatory Visit: Payer: BC Managed Care – PPO

## 2018-03-07 DIAGNOSIS — Z510 Encounter for antineoplastic radiation therapy: Secondary | ICD-10-CM | POA: Diagnosis not present

## 2018-03-08 ENCOUNTER — Ambulatory Visit: Payer: BC Managed Care – PPO

## 2018-03-08 ENCOUNTER — Ambulatory Visit
Admission: RE | Admit: 2018-03-08 | Discharge: 2018-03-08 | Disposition: A | Discharge status: Home / Self Care | Payer: BC Managed Care – PPO | Source: Ambulatory Visit | Attending: Radiation Oncology | Admitting: Radiation Oncology

## 2018-03-08 DIAGNOSIS — Z510 Encounter for antineoplastic radiation therapy: Secondary | ICD-10-CM | POA: Diagnosis not present

## 2018-03-09 ENCOUNTER — Ambulatory Visit: Payer: BC Managed Care – PPO

## 2018-03-09 ENCOUNTER — Ambulatory Visit
Admission: RE | Admit: 2018-03-09 | Discharge: 2018-03-09 | Disposition: A | Discharge status: Home / Self Care | Payer: BC Managed Care – PPO | Source: Ambulatory Visit | Attending: Radiation Oncology | Admitting: Radiation Oncology

## 2018-03-09 DIAGNOSIS — Z510 Encounter for antineoplastic radiation therapy: Secondary | ICD-10-CM | POA: Diagnosis not present

## 2018-03-10 ENCOUNTER — Ambulatory Visit: Payer: BC Managed Care – PPO

## 2018-03-10 ENCOUNTER — Ambulatory Visit
Admission: RE | Admit: 2018-03-10 | Discharge: 2018-03-10 | Disposition: A | Discharge status: Home / Self Care | Payer: BC Managed Care – PPO | Source: Ambulatory Visit | Attending: Radiation Oncology | Admitting: Radiation Oncology

## 2018-03-10 ENCOUNTER — Encounter: Payer: Self-pay | Admitting: *Deleted

## 2018-03-10 DIAGNOSIS — Z510 Encounter for antineoplastic radiation therapy: Secondary | ICD-10-CM | POA: Diagnosis not present

## 2018-03-11 ENCOUNTER — Ambulatory Visit: Payer: BC Managed Care – PPO

## 2018-03-11 ENCOUNTER — Ambulatory Visit
Admission: RE | Admit: 2018-03-11 | Discharge: 2018-03-11 | Disposition: A | Discharge status: Home / Self Care | Payer: BC Managed Care – PPO | Source: Ambulatory Visit | Attending: Radiation Oncology | Admitting: Radiation Oncology

## 2018-03-11 ENCOUNTER — Ambulatory Visit: Payer: BC Managed Care – PPO | Admitting: Radiation Oncology

## 2018-03-11 ENCOUNTER — Encounter (HOSPITAL_COMMUNITY): Payer: Self-pay | Admitting: Internal Medicine

## 2018-03-11 ENCOUNTER — Ambulatory Visit (HOSPITAL_COMMUNITY)
Admission: RE | Admit: 2018-03-11 | Discharge: 2018-03-11 | Disposition: A | Discharge status: Home / Self Care | Payer: BC Managed Care – PPO | Source: Ambulatory Visit | Attending: Internal Medicine | Admitting: Internal Medicine

## 2018-03-11 ENCOUNTER — Ambulatory Visit (HOSPITAL_BASED_OUTPATIENT_CLINIC_OR_DEPARTMENT_OTHER)
Admission: RE | Admit: 2018-03-11 | Discharge: 2018-03-11 | Disposition: A | Discharge status: Home / Self Care | Payer: BC Managed Care – PPO | Source: Ambulatory Visit | Attending: Internal Medicine | Admitting: Internal Medicine

## 2018-03-11 VITALS — BP 119/72 | HR 72 | Wt 144.0 lb

## 2018-03-11 DIAGNOSIS — Z9889 Other specified postprocedural states: Secondary | ICD-10-CM | POA: Insufficient documentation

## 2018-03-11 DIAGNOSIS — Z79899 Other long term (current) drug therapy: Secondary | ICD-10-CM | POA: Insufficient documentation

## 2018-03-11 DIAGNOSIS — Z170 Estrogen receptor positive status [ER+]: Secondary | ICD-10-CM | POA: Diagnosis present

## 2018-03-11 DIAGNOSIS — C50211 Malignant neoplasm of upper-inner quadrant of right female breast: Secondary | ICD-10-CM | POA: Diagnosis not present

## 2018-03-11 DIAGNOSIS — Z510 Encounter for antineoplastic radiation therapy: Secondary | ICD-10-CM | POA: Diagnosis not present

## 2018-03-11 NOTE — Addendum Note (Signed)
Encounter addended by: Scarlette Calico, RN on: 03/11/2018 2:26 PM  Actions taken: Order list changed, Diagnosis association updated, Sign clinical note

## 2018-03-11 NOTE — Progress Notes (Signed)
  Echocardiogram 2D Echocardiogram has been performed.  [name] T Kendrah Lovern 03/11/2018, 1:44 PM

## 2018-03-11 NOTE — Progress Notes (Signed)
Hannah Woodard  Referring Physician: Magrinat   HPI:  Hannah Woodard is 47 y.o. female with right breast cancer referred by Dr. Jana Hakim for enrollment into the Cardio-Oncology program.  Biopsy of right breast mass 08/24/2017 showed ([id number]) invasive ductal carcinoma, grade 2, estrogen receptor 95% positive, progesterone receptor 100% positive, both with strong staining intensity, with an MIB-1 of 60%, and HER-2 amplified, with a signals ratio of 2.84 (full report pending).  ONCOLOGY HISTORY  (1) status post right lumpectomy and sentinel axillary lymph node sampling 10/07/2017 at [facility] for a pT1C pN0, stage IA invasive ductal carcinoma, grade 3, with negative margins             (a) 1 sentinel lymph node removed, bilateral oncoplastic breast reduction  (2) adjuvant chemotherapy consisting of paclitaxel weekly x12 and trastuzumab every 21 days STARTING 11/12/2017  (3) continue trastuzumab to complete a year  (4) adjuvant radiation to follow therapy  (5) adjuvant antiestrogens to follow at the completion of local treatment  (5) goserelin for fertility preservation started 09/29/2017   Doing well with Herceptin. Tolerating well. No SOB or edema. Vitals stable. Walking the dogs. Has personal trainer. Has lost 30 pounds with low-carb diet. Has 1 week left of XRT.   Echo 03/11/18: EF 60-65% GLS -21.0%. LS 10.2 cm/s. Personally reviewed  ECHO 3/19: 85-02% normal diastolic function  GLS -77.4%   Past Medical History:  Diagnosis Date  . Breast cancer ([place])   . Family history of ovarian cancer 09/02/2017  . Family history of prostate cancer in father 09/02/2017  . Family history of uterine cancer 09/02/2017  . Plantar fasciitis     Current Outpatient Medications  Medication Sig Dispense Refill  . Ascorbic Acid (VITAMIN C PO) Take 400 Units by mouth 2 (two) times daily.    Marland Kitchen b complex vitamins tablet Take 1 tablet by mouth daily.    . benzonatate (TESSALON  PERLES) 100 MG capsule Take 1 capsule (100 mg total) by mouth 3 (three) times daily as needed for cough (Max: 653m per day). 60 capsule 0  . Cholecalciferol (VITAMIN D3) 5000 units TABS Take 10,000 Units by mouth daily.    .Marland KitchenECHINACEA PO Take 1 capsule by mouth as directed.     . fish oil-omega-3 fatty acids 1000 MG capsule Take 1 g by mouth daily.    .Marland Kitchengabapentin (NEURONTIN) 300 MG capsule Take 1 capsule (300 mg total) by mouth at bedtime. 90 capsule 4  . guaiFENesin (MUCINEX) 600 MG 12 hr tablet Take 600 mg by mouth 2 (two) times daily.    . Iodine Strong, Lugols, (STRONG IODINE) 5 % solution Take 0.2 mLs by mouth 3 (three) times daily. 480 mL 0  . ketoconazole (NIZORAL) 2 % cream Apply 1 application topically daily. 15 g 0  . lidocaine (XYLOCAINE) 2 % solution Use as directed 20 mLs in the mouth or throat every 6 (six) hours as needed for mouth pain.     . magic mouthwash w/lidocaine SOLN Take 5 mLs by mouth 4 (four) times daily as needed for mouth pain. Swish and spit 240 mL 1  . ondansetron (ZOFRAN ODT) 8 MG disintegrating tablet 880mODT q6 hours prn nausea 30 tablet 0  . OVER THE COUNTER MEDICATION Take 1 tablet by mouth at bedtime as needed (constipation).     . promethazine-dextromethorphan (PROMETHAZINE-DM) 6.25-15 MG/5ML syrup Take 5 mLs by mouth 4 (four) times daily as needed for cough. 240 mL 1  . triamcinolone ointment (  KENALOG) 0.1 % Apply 1 application topically 2 (two) times daily. 80 g 1  . TURMERIC PO Take 1 capsule by mouth as directed.     Marland Kitchen UNABLE TO FIND Take 500 mg by mouth daily. [place] mushroom    . UNABLE TO FIND Take 500 mg by mouth daily. [place] Mushroom    . UNABLE TO FIND Take 5 mg by mouth as directed. CBD Oil; patient takes for nausea     . Zinc Acetate 50 MG CAPS Take 1 capsule by mouth every other day.     . anastrozole (ARIMIDEX) 1 MG tablet Take 1 tablet (1 mg total) by mouth daily. (Patient not taking: Reported on 03/11/2018) 30 tablet 6   No current  facility-administered medications for this encounter.     Allergies  Allergen Reactions  . Adhesive [Tape]   . Milk-Related Compounds Diarrhea    Stomach cramping  . Poison Ivy Extract [Poison Ivy Extract] Hives, Itching and Rash      Social History   Socioeconomic History  . Marital status: Single    Spouse name: Not on file  . Number of children: Not on file  . Years of education: Not on file  . Highest education level: Not on file  Occupational History  . Not on file  Social Needs  . Financial resource strain: Not on file  . Food insecurity:    Worry: Not on file    Inability: Not on file  . Transportation needs:    Medical: Not on file    Non-medical: Not on file  Tobacco Use  . Smoking status: Never Smoker  . Smokeless tobacco: Never Used  Substance and Sexual Activity  . Alcohol use: Yes  . Drug use: No  . Sexual activity: Not on file  Lifestyle  . Physical activity:    Days per week: Not on file    Minutes per session: Not on file  . Stress: Not on file  Relationships  . Social connections:    Talks on phone: Not on file    Gets together: Not on file    Attends religious service: Not on file    Active member of club or organization: Not on file    Attends meetings of clubs or organizations: Not on file    Relationship status: Not on file  . Intimate partner violence:    Fear of current or ex partner: Not on file    Emotionally abused: Not on file    Physically abused: Not on file    Forced sexual activity: Not on file  Other Topics Concern  . Not on file  Social History Narrative  . Not on file      Family History  Problem Relation Age of Onset  . Ovarian cancer Mother 29  . Prostate cancer Father 65       'high gleason' unsure number  . Ulcerative colitis Sister   . Other Sister        abn ovaian growth, partial hysterectomy  . Uterine cancer Maternal Aunt 19  . Stroke Maternal Aunt 66  . Basal cell carcinoma Brother   . Skin cancer  Paternal Uncle   . Skin cancer Maternal Grandmother   . Stroke Paternal Grandfather 53  . Hydrocephalus Cousin     Vitals:   03/11/18 1401  BP: 119/72  Pulse: 72  SpO2: 100%  Weight: 144 lb (65.3 kg)    PHYSICAL EXAM: General:  Well appearing. No resp difficulty  HEENT: normal Neck: supple. no JVD. Carotids 2+ bilat; no bruits. No lymphadenopathy or thryomegaly appreciated. Cor: PMI nondisplaced. Regular rate & rhythm. No rubs, gallops or murmurs. Left chest port Lungs: clear Abdomen: soft, nontender, nondistended. No hepatosplenomegaly. No bruits or masses. Good bowel sounds. Extremities: no cyanosis, clubbing, rash, edema Neuro: alert & orientedx3, cranial nerves grossly intact. moves all 4 extremities w/o difficulty. Affect pleasant  ASSESSMENT & PLAN: 1.  Right breast cancer - triple positive. S/p lumpectomy  - I reviewed echos personally. EF and Doppler parameters stable. No HF on exam. Continue Herceptin.    Glori Bickers, MD  2:14 PM

## 2018-03-11 NOTE — Patient Instructions (Signed)
Your physician recommends that you schedule a follow-up appointment in: 3 months with echocardiogram

## 2018-03-14 ENCOUNTER — Ambulatory Visit: Payer: BC Managed Care – PPO

## 2018-03-14 ENCOUNTER — Ambulatory Visit
Admission: RE | Admit: 2018-03-14 | Discharge: 2018-03-14 | Disposition: A | Discharge status: Home / Self Care | Payer: BC Managed Care – PPO | Source: Ambulatory Visit | Attending: Radiation Oncology | Admitting: Radiation Oncology

## 2018-03-14 DIAGNOSIS — Z510 Encounter for antineoplastic radiation therapy: Secondary | ICD-10-CM | POA: Diagnosis not present

## 2018-03-15 ENCOUNTER — Ambulatory Visit: Payer: BC Managed Care – PPO

## 2018-03-15 ENCOUNTER — Ambulatory Visit
Admission: RE | Admit: 2018-03-15 | Discharge: 2018-03-15 | Disposition: A | Discharge status: Home / Self Care | Payer: BC Managed Care – PPO | Source: Ambulatory Visit | Attending: Radiation Oncology | Admitting: Radiation Oncology

## 2018-03-15 ENCOUNTER — Ambulatory Visit: Payer: BC Managed Care – PPO | Admitting: Radiation Oncology

## 2018-03-15 DIAGNOSIS — Z510 Encounter for antineoplastic radiation therapy: Secondary | ICD-10-CM | POA: Diagnosis not present

## 2018-03-16 ENCOUNTER — Ambulatory Visit: Payer: BC Managed Care – PPO

## 2018-03-16 ENCOUNTER — Ambulatory Visit
Admission: RE | Admit: 2018-03-16 | Discharge: 2018-03-16 | Disposition: A | Discharge status: Home / Self Care | Payer: BC Managed Care – PPO | Source: Ambulatory Visit | Attending: Radiation Oncology | Admitting: Radiation Oncology

## 2018-03-16 DIAGNOSIS — Z510 Encounter for antineoplastic radiation therapy: Secondary | ICD-10-CM | POA: Diagnosis not present

## 2018-03-17 ENCOUNTER — Ambulatory Visit
Admission: RE | Admit: 2018-03-17 | Discharge: 2018-03-17 | Disposition: A | Discharge status: Home / Self Care | Payer: BC Managed Care – PPO | Source: Ambulatory Visit | Attending: Radiation Oncology | Admitting: Radiation Oncology

## 2018-03-17 ENCOUNTER — Ambulatory Visit: Payer: BC Managed Care – PPO

## 2018-03-17 DIAGNOSIS — Z510 Encounter for antineoplastic radiation therapy: Secondary | ICD-10-CM | POA: Diagnosis not present

## 2018-03-18 ENCOUNTER — Inpatient Hospital Stay: Payer: BC Managed Care – PPO

## 2018-03-18 ENCOUNTER — Inpatient Hospital Stay: Payer: BC Managed Care – PPO | Attending: Oncology

## 2018-03-18 ENCOUNTER — Ambulatory Visit
Admission: RE | Admit: 2018-03-18 | Discharge: 2018-03-18 | Disposition: A | Discharge status: Home / Self Care | Payer: BC Managed Care – PPO | Source: Ambulatory Visit | Attending: Radiation Oncology | Admitting: Radiation Oncology

## 2018-03-18 ENCOUNTER — Encounter: Payer: Self-pay | Admitting: Radiation Oncology

## 2018-03-18 ENCOUNTER — Ambulatory Visit: Payer: BC Managed Care – PPO

## 2018-03-18 VITALS — BP 116/76 | HR 65 | Temp 98.2°F | Resp 18

## 2018-03-18 DIAGNOSIS — Z170 Estrogen receptor positive status [ER+]: Secondary | ICD-10-CM | POA: Insufficient documentation

## 2018-03-18 DIAGNOSIS — C50211 Malignant neoplasm of upper-inner quadrant of right female breast: Secondary | ICD-10-CM

## 2018-03-18 DIAGNOSIS — Z9221 Personal history of antineoplastic chemotherapy: Secondary | ICD-10-CM | POA: Diagnosis not present

## 2018-03-18 DIAGNOSIS — Z5112 Encounter for antineoplastic immunotherapy: Secondary | ICD-10-CM | POA: Insufficient documentation

## 2018-03-18 DIAGNOSIS — Z95828 Presence of other vascular implants and grafts: Secondary | ICD-10-CM

## 2018-03-18 DIAGNOSIS — Z79818 Long term (current) use of other agents affecting estrogen receptors and estrogen levels: Secondary | ICD-10-CM | POA: Diagnosis not present

## 2018-03-18 DIAGNOSIS — Z510 Encounter for antineoplastic radiation therapy: Secondary | ICD-10-CM | POA: Diagnosis not present

## 2018-03-18 LAB — CMP (CANCER CENTER ONLY)
ALBUMIN: 4 g/dL (ref 3.5–5.0)
ALK PHOS: 66 U/L (ref 40–150)
ALT: 22 U/L (ref 0–55)
ANION GAP: 8 (ref 3–11)
AST: 21 U/L (ref 5–34)
BUN: 19 mg/dL (ref 7–26)
CALCIUM: 9.4 mg/dL (ref 8.4–10.4)
CHLORIDE: 104 mmol/L (ref 98–109)
CO2: 28 mmol/L (ref 22–29)
Creatinine: 0.63 mg/dL (ref 0.60–1.10)
GFR, Estimated: >60 mL/min (ref >60)
GLUCOSE: 92 mg/dL (ref 70–140)
POTASSIUM: 4 mmol/L (ref 3.5–5.1)
SODIUM: 140 mmol/L (ref 136–145)
Total Bilirubin: 0.5 mg/dL (ref 0.2–1.2)
Total Protein: 6.6 g/dL (ref 6.4–8.3)

## 2018-03-18 LAB — CBC WITH DIFFERENTIAL (CANCER CENTER ONLY)
BASOS PCT: 1 %
Basophils Absolute: 0 10*3/uL (ref 0.0–0.1)
EOS ABS: 1 10*3/uL — AB (ref 0.0–0.5)
EOS PCT: 22 %
HCT: 36.1 % (ref 34.8–46.6)
HEMOGLOBIN: 12.3 g/dL (ref 11.6–15.9)
Lymphocytes Relative: 12 %
Lymphs Abs: 0.6 10*3/uL — ABNORMAL LOW (ref 0.9–3.3)
MCH: 33.1 pg (ref 25.1–34.0)
MCHC: 34.1 g/dL (ref 31.5–36.0)
MCV: 97 fL (ref 79.5–101.0)
Monocytes Absolute: 0.4 10*3/uL (ref 0.1–0.9)
Monocytes Relative: 8 %
Neutro Abs: 2.7 10*3/uL (ref 1.5–6.5)
Neutrophils Relative %: 57 %
PLATELETS: 185 10*3/uL (ref 145–400)
RBC: 3.72 MIL/uL (ref 3.70–5.45)
RDW: 13.9 % (ref 11.2–14.5)
WBC Count: 4.7 10*3/uL (ref 3.9–10.3)

## 2018-03-18 MED ORDER — ACETAMINOPHEN 325 MG PO TABS
650.0000 mg | ORAL_TABLET | Freq: Once | ORAL | Status: AC
  Administered 2018-03-18: 650 mg via ORAL

## 2018-03-18 MED ORDER — SODIUM CHLORIDE 0.9% FLUSH
10.0000 mL | Freq: Once | INTRAVENOUS | Status: AC
  Administered 2018-03-18: 10 mL
  Filled 2018-03-18: qty 10

## 2018-03-18 MED ORDER — GOSERELIN ACETATE 3.6 MG ~~LOC~~ IMPL
3.6000 mg | DRUG_IMPLANT | Freq: Once | SUBCUTANEOUS | Status: AC
  Administered 2018-03-18: 3.6 mg via SUBCUTANEOUS

## 2018-03-18 MED ORDER — SODIUM CHLORIDE 0.9 % IV SOLN
Freq: Once | INTRAVENOUS | Status: AC
  Administered 2018-03-18: 13:00:00 via INTRAVENOUS

## 2018-03-18 MED ORDER — ACETAMINOPHEN 325 MG PO TABS
ORAL_TABLET | ORAL | Status: AC
  Filled 2018-03-18: qty 2

## 2018-03-18 MED ORDER — DIPHENHYDRAMINE HCL 25 MG PO CAPS
ORAL_CAPSULE | ORAL | Status: AC
  Filled 2018-03-18: qty 1

## 2018-03-18 MED ORDER — GOSERELIN ACETATE 3.6 MG ~~LOC~~ IMPL
DRUG_IMPLANT | SUBCUTANEOUS | Status: AC
  Filled 2018-03-18: qty 3.6

## 2018-03-18 MED ORDER — SODIUM CHLORIDE 0.9 % IV SOLN
6.0000 mg/kg | Freq: Once | INTRAVENOUS | Status: AC
  Administered 2018-03-18: 441 mg via INTRAVENOUS
  Filled 2018-03-18: qty 21

## 2018-03-18 MED ORDER — DIPHENHYDRAMINE HCL 25 MG PO CAPS
25.0000 mg | ORAL_CAPSULE | Freq: Once | ORAL | Status: AC
  Administered 2018-03-18: 25 mg via ORAL

## 2018-03-18 MED ORDER — SODIUM CHLORIDE 0.9% FLUSH
10.0000 mL | INTRAVENOUS | Status: DC | PRN
  Administered 2018-03-18: 10 mL
  Filled 2018-03-18: qty 10

## 2018-03-18 MED ORDER — HEPARIN SOD (PORK) LOCK FLUSH 100 UNIT/ML IV SOLN
500.0000 [IU] | Freq: Once | INTRAVENOUS | Status: AC | PRN
  Administered 2018-03-18: 500 [IU]
  Filled 2018-03-18: qty 5

## 2018-03-18 NOTE — Patient Instructions (Signed)
[facility] Discharge Instructions for Patients Receiving Chemotherapy  Today you received the following chemotherapy agents Herceptin  To help prevent nausea and vomiting after your treatment, we encourage you to take your nausea medication as directed   If you develop nausea and vomiting that is not controlled by your nausea medication, call the clinic.   BELOW ARE SYMPTOMS THAT SHOULD BE REPORTED IMMEDIATELY:  *FEVER GREATER THAN 100.5 F  *CHILLS WITH OR WITHOUT FEVER  NAUSEA AND VOMITING THAT IS NOT CONTROLLED WITH YOUR NAUSEA MEDICATION  *UNUSUAL SHORTNESS OF BREATH  *UNUSUAL BRUISING OR BLEEDING  TENDERNESS IN MOUTH AND THROAT WITH OR WITHOUT PRESENCE OF ULCERS  *URINARY PROBLEMS  *BOWEL PROBLEMS  UNUSUAL RASH Items with * indicate a potential emergency and should be followed up as soon as possible.  Feel free to call the clinic should you have any questions or concerns. The clinic phone number is [phone].  Please show the CHEMO ALERT CARD at check-in to the Emergency Department and triage nurse.   

## 2018-03-21 ENCOUNTER — Ambulatory Visit: Payer: BC Managed Care – PPO

## 2018-03-22 ENCOUNTER — Ambulatory Visit: Payer: BC Managed Care – PPO

## 2018-03-23 NOTE — Progress Notes (Signed)
  Radiation Oncology         [phone]) [phone] ________________________________  Name: SILVER ACHEY MRN: [id number]  Date: 03/18/2018  DOB: 24-Jan-1971  End of Treatment Note  Diagnosis:   47 y.o. 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 This was delivered using a 3-D conformal technique. The patient then received a boost to the seroma. This delivered an additional 10 Gy in 5 fractions using 9e electrons with a special teletherapy technique. The total dose was 60.4 Gy.  Narrative: The patient tolerated radiation treatment relatively well.   The patient had some expected skin irritation as she progressed during treatment. Moist desquamation was not present at the end of treatment.  Plan: The patient has completed radiation treatment. The patient will return to radiation oncology clinic for routine followup in one month. I advised the patient to call or return sooner if they have any questions or concerns related to their recovery or treatment. ________________________________  Jodelle Gross, MD, PhD  This document serves as a record of services personally performed by Kyung Rudd, MD. It was created on his behalf by Rae Lips, a trained medical scribe. The creation of this record is based on the scribe's personal observations and the provider's statements to them. This document has been checked and approved by the attending provider.

## 2018-04-05 NOTE — Progress Notes (Signed)
Hannah Woodard  [phone] Fax:([phone]) [phone]     ID: SHYANN HEFNER DOB: 02-Jul-1971  MR#: [id number]  QIH#:[id number]  Patient Care Team: Unk Pinto, MD as PCP - General (Internal Medicine) Jovita Kussmaul, MD as Consulting Physician (General Surgery) Ida Uppal, Virgie Dad, MD as Consulting Physician (Oncology) Kyung Rudd, MD as Consulting Physician (Radiation Oncology) Lorelle Gibbs, MD (Radiology) Howard-McNatt, Mable Fill, MD as Referring Physician (Surgery) OTHER MD:  CHIEF COMPLAINT: Triple positive breast cancer  CURRENT TREATMENT: Trastuzumab, goserelin, anastrozole  INTERVAL HISTORY: Hannah Woodard returns today for follow-up and treatment of her triple positive breast cancer. The patient continues on trastuzumab. She also receives goserelin, every 28 days. She started anastrozole 03/28/2018. She has been tolerating it well. Adding she has been a little achy, but she hasn't been as active.   REVIEW OF SYSTEMS: Hannah Woodard is doing well. She walks her dog every other day and is hoping to get back to going to the gym. She noticed discoloration to her nipple after radiation. She had three wisdom teeth removed last week. The patient denies unusual headaches, visual changes, nausea, vomiting, or dizziness. There has been no unusual cough, phlegm production, or pleurisy. This been no change in bowel or bladder habits. The patient denies unexplained fatigue or unexplained weight loss, bleeding, rash, or fever. A detailed review of systems was otherwise noncontributory.    HISTORY OF CURRENT ILLNESS: From the original intake note:  The patient has a history of cysts and nodules in the right breast which have been closely followed, with exams 07/30/2016 and 01/28/2017.  On 07/29/2017 follow-up right breast ultrasonography showed no findings of concern.  Bilateral diagnostic mammography 08/18/2017 with right breast ultrasonography on 08/18/2017 at  [facility] found the breast density to be category C.  There was now a new irregular high density mass in the posterior portion of the right breast seen on the mediolateral oblique view only.  Ultrasonography confirmed a 1.4 cm lobulated mass in the upper inner quadrant of the right breast 10 cm from the nipple associated with pectoral muscle invasion.  The right axilla was sonographically benign.  Biopsy of this mass 08/24/2017 showed ([id number]) invasive ductal carcinoma, grade 2, estrogen receptor 95% positive, progesterone receptor 100% positive, both with strong staining intensity, with an MIB-1 of 60%, and HER-2 amplified, with a signals ratio of 2.84 (full report pending).  The patient's subsequent history is as detailed below.  PAST MEDICAL HISTORY: Past Medical History:  Diagnosis Date  . Breast cancer ([place])   . Family history of ovarian cancer 09/02/2017  . Family history of prostate cancer in father 09/02/2017  . Family history of uterine cancer 09/02/2017  . Plantar fasciitis     PAST SURGICAL HISTORY: Past Surgical History:  Procedure Laterality Date  . right lumpectomy, sentinel node biopsy, and bilateral mammoplasty  10/2017   [facility]    FAMILY HISTORY Family History  Problem Relation Age of Onset  . Ovarian cancer Mother 33  . Prostate cancer Father 45       'high gleason' unsure number  . Ulcerative colitis Sister   . Other Sister        abn ovaian growth, partial hysterectomy  . Uterine cancer Maternal Aunt 82  . Stroke Maternal Aunt 66  . Basal cell carcinoma Brother   . Skin cancer Paternal Uncle   . Skin cancer Maternal Grandmother   . Stroke Paternal Grandfather 50  . Hydrocephalus Cousin   The patient's father died  from alcoholic cirrhosis at age 85.  He had been diagnosed with prostate cancer at age 53.  The patient's mother was diagnosed with ovarian cancer at age 34 and died a year later.  The patient has 1 brother, 1 sister.  The patient's brother has had  basal cell and other skin cancers.  A paternal aunt had uterine cancer.  Other relatives have had skin cancers but the patient does not know if these were melanomas or not  GYNECOLOGIC HISTORY:  No LMP recorded. (Menstrual status: Chemotherapy).  Menarche age 41, the patient has never been pregnant.  She is still having regular periods.  She used oral contraceptives briefly in the past without complications. She and her husband are very interested in having children, and currently (December 2018) they are not using any contraception.   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  Her husband Rodman Key (goes by [employer]") is a Dealer.  At home it's just them, a [employer] and 2 cats.    ADVANCED DIRECTIVES: Not in place   HEALTH MAINTENANCE: Social History   Tobacco Use  . Smoking status: Never Smoker  . Smokeless tobacco: Never Used  Substance Use Topics  . Alcohol use: Yes  . Drug use: No     Colonoscopy: Never  PAP:  Bone density: Never   Allergies  Allergen Reactions  . Adhesive [Tape]   . Milk-Related Compounds Diarrhea    Stomach cramping  . Poison Ivy Extract [Poison Ivy Extract] Hives, Itching and Rash    Current Outpatient Medications  Medication Sig Dispense Refill  . anastrozole (ARIMIDEX) 1 MG tablet Take 1 tablet (1 mg total) by mouth daily. (Patient not taking: Reported on 03/11/2018) 30 tablet 6  . Ascorbic Acid (VITAMIN C PO) Take 400 Units by mouth 2 (two) times daily.    Marland Kitchen b complex vitamins tablet Take 1 tablet by mouth daily.    . benzonatate (TESSALON PERLES) 100 MG capsule Take 1 capsule (100 mg total) by mouth 3 (three) times daily as needed for cough (Max: 669m per day). 60 capsule 0  . Cholecalciferol (VITAMIN D3) 5000 units TABS Take 10,000 Units by mouth daily.    .Marland KitchenECHINACEA PO Take 1 capsule by mouth as directed.     . fish oil-omega-3 fatty acids 1000 MG capsule Take 1 g  by mouth daily.    .Marland Kitchengabapentin (NEURONTIN) 300 MG capsule Take 1 capsule (300 mg total) by mouth at bedtime. 90 capsule 4  . guaiFENesin (MUCINEX) 600 MG 12 hr tablet Take 600 mg by mouth 2 (two) times daily.    . Iodine Strong, Lug[place], (STRONG IODINE) 5 % solution Take 0.2 mLs by mouth 3 (three) times daily. 480 mL 0  . ketoconazole (NIZORAL) 2 % cream Apply 1 application topically daily. 15 g 0  . lidocaine (XYLOCAINE) 2 % solution Use as directed 20 mLs in the mouth or throat every 6 (six) hours as needed for mouth pain.     . magic mouthwash w/lidocaine SOLN Take 5 mLs by mouth 4 (four) times daily as needed for mouth pain. Swish and spit 240 mL 1  . ondansetron (ZOFRAN ODT) 8 MG disintegrating tablet 87mODT q6 hours prn nausea 30 tablet 0  . OVER THE COUNTER MEDICATION Take 1 tablet by mouth at bedtime as needed (constipation).     . promethazine-dextromethorphan (PROMETHAZINE-DM) 6.25-15 MG/5ML syrup Take 5 mLs by mouth 4 (four) times  daily as needed for cough. 240 mL 1  . triamcinolone ointment (KENALOG) 0.1 % Apply 1 application topically 2 (two) times daily. 80 g 1  . TURMERIC PO Take 1 capsule by mouth as directed.     Marland Kitchen UNABLE TO FIND Take 500 mg by mouth daily. [place] mushroom    . UNABLE TO FIND Take 500 mg by mouth daily. [place] Mushroom    . UNABLE TO FIND Take 5 mg by mouth as directed. CBD Oil; patient takes for nausea     . Zinc Acetate 50 MG CAPS Take 1 capsule by mouth every other day.      No current facility-administered medications for this visit.     OBJECTIVE: Young white woman in no acute distress  Vitals:   04/08/18 1120  BP: (!) 122/94  Pulse: 76  Resp: 18  Temp: 98.2 F (36.8 C)  SpO2: 100%     Body mass index is 23.18 kg/m.   Wt Readings from Last 3 Encounters:  04/08/18 143 lb 9.6 oz (65.1 kg)  03/11/18 144 lb (65.3 kg)  02/04/18 147 lb 8 oz (66.9 kg)   ECOG FS:1 - Symptomatic but completely ambulatory  Sclerae unicteric, pupils round and  equal Oropharynx clear and moist No cervical or supraclavicular adenopathy Lungs no rales or rhonchi Heart regular rate and rhythm Abd soft, nontender, positive bowel sounds MSK no focal spinal tenderness, no upper extremity lymphedema Neuro: nonfocal, well oriented, appropriate affect Breasts: The right breast is status post lumpectomy and radiation and both breasts are status post reduction mammoplasty.  Nipple on the right is slightly darker and slightly firmer.  These are expected posttreatment changes.  There is no evidence of disease recurrence.  Both axillae are benign.  LAB RESULTS:  CMP     Component Value Date/Time   NA 143 04/08/2018 1042   NA 138 09/01/2017 1238   K 3.7 04/08/2018 1042   K 4.4 09/01/2017 1238   CL 104 04/08/2018 1042   CO2 31 04/08/2018 1042   CO2 25 09/01/2017 1238   GLUCOSE 84 04/08/2018 1042   GLUCOSE 83 09/01/2017 1238   BUN 14 04/08/2018 1042   BUN 9.2 09/01/2017 1238   CREATININE 0.65 04/08/2018 1042   CREATININE 0.7 09/01/2017 1238   CALCIUM 9.5 04/08/2018 1042   CALCIUM 9.4 09/01/2017 1238   PROT 6.9 04/08/2018 1042   PROT 7.4 09/01/2017 1238   ALBUMIN 4.1 04/08/2018 1042   ALBUMIN 4.2 09/01/2017 1238   AST 26 04/08/2018 1042   AST 21 09/01/2017 1238   ALT 44 04/08/2018 1042   ALT 21 09/01/2017 1238   ALKPHOS 82 04/08/2018 1042   ALKPHOS 60 09/01/2017 1238   BILITOT 0.7 04/08/2018 1042   BILITOT 0.65 09/01/2017 1238   GFRNONAA >60 04/08/2018 1042   GFRNONAA 111 05/18/2017 0928   GFRAA >60 04/08/2018 1042   GFRAA 128 05/18/2017 0928    No results found for: TOTALPROTELP, ALBUMINELP, A1GS, A2GS, BETS, BETA2SER, GAMS, MSPIKE, SPEI  No results found for: KPAFRELGTCHN, LAMBDASER, [facility]  Lab Results  Component Value Date   WBC 4.7 03/18/2018   NEUTROABS 2.7 03/18/2018   HGB 12.3 03/18/2018   HCT 36.1 03/18/2018   MCV 97.0 03/18/2018   PLT 185 03/18/2018      Chemistry      Component Value Date/Time   NA 143  04/08/2018 1042   NA 138 09/01/2017 1238   K 3.7 04/08/2018 1042   K 4.4 09/01/2017 1238  CL 104 04/08/2018 1042   CO2 31 04/08/2018 1042   CO2 25 09/01/2017 1238   BUN 14 04/08/2018 1042   BUN 9.2 09/01/2017 1238   CREATININE 0.65 04/08/2018 1042   CREATININE 0.7 09/01/2017 1238      Component Value Date/Time   CALCIUM 9.5 04/08/2018 1042   CALCIUM 9.4 09/01/2017 1238   ALKPHOS 82 04/08/2018 1042   ALKPHOS 60 09/01/2017 1238   AST 26 04/08/2018 1042   AST 21 09/01/2017 1238   ALT 44 04/08/2018 1042   ALT 21 09/01/2017 1238   BILITOT 0.7 04/08/2018 1042   BILITOT 0.65 09/01/2017 1238       No results found for: LABCA2  No components found for: MBEMLJ449  No results for input(s): INR in the last 168 hours.  No results found for: LABCA2  No results found for: EEF007  No results found for: HQR975  No results found for: OIT254  No results found for: CA2729  No components found for: HGQUANT  No results found for: CEA1 / No results found for: CEA1   No results found for: AFPTUMOR  No results found for: CHROMOGRNA  No results found for: PSA1  Appointment on 04/08/2018  Component Date Value Ref Range Status  . Sodium 04/08/2018 143  135 - 145 mmol/L Final   Please note reference intervals were recently updated.  . Potassium 04/08/2018 3.7  3.5 - 5.1 mmol/L Final  . Chloride 04/08/2018 104  98 - 111 mmol/L Final  . CO2 04/08/2018 31  22 - 32 mmol/L Final  . Glucose, Bld 04/08/2018 84  70 - 99 mg/dL Final  . BUN 04/08/2018 14  6 - 20 mg/dL Final   Please note change in reference range.  . Creatinine 04/08/2018 0.65  0.44 - 1.00 mg/dL Final  . Calcium 04/08/2018 9.5  8.9 - 10.3 mg/dL Final  . Total Protein 04/08/2018 6.9  6.5 - 8.1 g/dL Final  . Albumin 04/08/2018 4.1  3.5 - 5.0 g/dL Final  . AST 04/08/2018 26  15 - 41 U/L Final  . ALT 04/08/2018 44  0 - 44 U/L Final  . Alkaline Phosphatase 04/08/2018 82  38 - 126 U/L Final  . Total Bilirubin 04/08/2018 0.7   0.3 - 1.2 mg/dL Final  . GFR, Est Non Af Am 04/08/2018 >60  >60 mL/min Final  . GFR, Est AFR Am 04/08/2018 >60  >60 mL/min Final   Comment: (NOTE) The eGFR has been calculated using the CKD EPI equation. This calculation has not been validated in all clinical situations. eGFR's persistently <60 mL/min signify possible Chronic Kidney Disease.   Georgiann Hahn gap 04/08/2018 8  5 - 15 Final   Performed at [facility], [place] [address], [place], [place] [address]    (this displays the last labs from the last 3 days)  No results found for: TOTALPROTELP, ALBUMINELP, A1GS, A2GS, BETS, BETA2SER, GAMS, MSPIKE, SPEI (this displays SPEP labs)  No results found for: KPAFRELGTCHN, LAMBDASER, KAPLAMBRATIO (kappa/lambda light chains)  No results found for: HGBA, HGBA2QUANT, HGBFQUANT, HGBSQUAN (Hemoglobinopathy evaluation)   No results found for: LDH  Lab Results  Component Value Date   IRON 132 12/21/2017   TIBC 305 12/21/2017   IRONPCTSAT 43 12/21/2017   (Iron and TIBC)  Lab Results  Component Value Date   FERRITIN 67 12/16/2016    Urinalysis    Component Value Date/Time   COLORURINE AMBER (A) 12/27/2017 1905   APPEARANCEUR HAZY (A) 12/27/2017 1905  LABSPEC 1.005 12/27/2017 1905   PHURINE 7.0 12/27/2017 1905   GLUCOSEU NEGATIVE 12/27/2017 1905   HGBUR NEGATIVE 12/27/2017 1905   BILIRUBINUR NEGATIVE 12/27/2017 [place] 12/27/2017 1905   PROTEINUR NEGATIVE 12/27/2017 1905   UROBILINOGEN 0.2 12/06/2014 1529   NITRITE NEGATIVE 12/27/2017 1905   LEUKOCYTESUR TRACE (A) 12/27/2017 1905    STUDIES: No results found.   ELIGIBLE FOR AVAILABLE RESEARCH PROTOCOL: No  ASSESSMENT: 47 y.o. [place] woman status post right breast upper inner quadrant biopsy 08/24/2017 for a clinical T1c N0 invasive ductal carcinoma, triple positive, with an MIB-1 of 60%  (1) genetics testing 09/10/2017 through the Common Hereditary Cancer Panel + Melanoma Panel  found no deleterious mutations in: APC, ATM, AXIN2, BAP1, BARD1, BMPR1A, BRCA1, BRCA2, BRIP1, CDH1, CDK4, CDKN2A (p14ARF), CDKN2A (p16INK4a), CHEK2, CTNNA1, DICER1, EPCAM*, GREM1*, KIT, MEN1, MLH1, MSH2, MSH3, MSH6, MUTYH, NBN, NF1, PALB2, PDGFRA, PMS2, POLD1, POLE, POT1, PTEN, RAD50, RAD51C, RAD51D, RB1, SDHB, SDHC, SDHD, SMAD4, SMARCA4, STK11, TP53, TSC1, TSC2, VHL. 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  (a) baseline echocardiogram 09/10/2017 shows an ejection fraction of 55-60%  (b) echocardiogram 12/09/2017 showed an ejection fraction in the 60-65% range  (5) adjuvant radiation to follow   (6) anastrozole started 03/28/2018  (7) goserelin started 09/29/2017  PLAN: Hannah Woodard is now a little over half a year out from definitive surgery for her breast cancer.  She is only just started anastrozole earlier this month so it is a little early for side effects but at any rate she is tolerating it well.  She receives goserelin every 28 days.  She has no problems from that.  We initially did that for fertility preservation but we are currently doing it so that we can treat her with anastrozole.  At some point she will want to stop the goserelin and see whether or not she is naturally postmenopausal.  After much discussion today we decided we would continue the goserelin at least through January 2020.  She is on trastuzumab every 21  days.  She tolerates that well.  Her most recent echo was excellent and she already has her follow-up echo scheduled through Dr. Haroldine Laws.  I spent approximately 30 minutes with Anderson Malta with more than 50% of that time spent in counseling and coordination of care.  I am delighted that she is recovering as well as she is from her radiation although she is still not back to baseline.  I have encouraged her to exercise a little bit more.  She has an excellent diet at present  She will call with any problems that may develop before the next visit.   Musa Rewerts, Virgie Dad, MD  04/08/18 11:54 AM Medical Oncology and Hematology [facility] [address] [place], [place] [address] Tel. [phone]    Fax. [phone]  Carmela Hurt am acting as a scribe for Chauncey Cruel, MD.   I, Lurline Del MD, have reviewed the above documentation for accuracy and completeness,  and I agree with the above.

## 2018-04-08 ENCOUNTER — Inpatient Hospital Stay: Payer: BC Managed Care – PPO | Attending: Oncology

## 2018-04-08 ENCOUNTER — Inpatient Hospital Stay: Payer: BC Managed Care – PPO

## 2018-04-08 ENCOUNTER — Inpatient Hospital Stay (HOSPITAL_BASED_OUTPATIENT_CLINIC_OR_DEPARTMENT_OTHER): Payer: BC Managed Care – PPO | Admitting: Oncology

## 2018-04-08 ENCOUNTER — Telehealth: Payer: Self-pay | Admitting: Oncology

## 2018-04-08 VITALS — BP 122/94 | HR 76 | Temp 98.2°F | Resp 18 | Ht 66.0 in | Wt 143.6 lb

## 2018-04-08 DIAGNOSIS — C50211 Malignant neoplasm of upper-inner quadrant of right female breast: Secondary | ICD-10-CM | POA: Diagnosis not present

## 2018-04-08 DIAGNOSIS — Z79818 Long term (current) use of other agents affecting estrogen receptors and estrogen levels: Secondary | ICD-10-CM

## 2018-04-08 DIAGNOSIS — Z170 Estrogen receptor positive status [ER+]: Secondary | ICD-10-CM | POA: Diagnosis not present

## 2018-04-08 DIAGNOSIS — Z9221 Personal history of antineoplastic chemotherapy: Secondary | ICD-10-CM | POA: Diagnosis not present

## 2018-04-08 DIAGNOSIS — Z5112 Encounter for antineoplastic immunotherapy: Secondary | ICD-10-CM | POA: Insufficient documentation

## 2018-04-08 DIAGNOSIS — Y842 Radiological procedure and radiotherapy as the cause of abnormal reaction of the patient, or of later complication, without mention of misadventure at the time of the procedure: Secondary | ICD-10-CM | POA: Diagnosis not present

## 2018-04-08 DIAGNOSIS — Z79899 Other long term (current) drug therapy: Secondary | ICD-10-CM | POA: Insufficient documentation

## 2018-04-08 DIAGNOSIS — Z8041 Family history of malignant neoplasm of ovary: Secondary | ICD-10-CM

## 2018-04-08 DIAGNOSIS — Z8042 Family history of malignant neoplasm of prostate: Secondary | ICD-10-CM

## 2018-04-08 DIAGNOSIS — Z923 Personal history of irradiation: Secondary | ICD-10-CM | POA: Insufficient documentation

## 2018-04-08 DIAGNOSIS — Z95828 Presence of other vascular implants and grafts: Secondary | ICD-10-CM

## 2018-04-08 DIAGNOSIS — Z803 Family history of malignant neoplasm of breast: Secondary | ICD-10-CM

## 2018-04-08 DIAGNOSIS — Z79811 Long term (current) use of aromatase inhibitors: Secondary | ICD-10-CM

## 2018-04-08 LAB — CMP (CANCER CENTER ONLY)
ALBUMIN: 4.1 g/dL (ref 3.5–5.0)
ALK PHOS: 82 U/L (ref 38–126)
ALT: 44 U/L (ref 0–44)
ANION GAP: 8 (ref 5–15)
AST: 26 U/L (ref 15–41)
BILIRUBIN TOTAL: 0.7 mg/dL (ref 0.3–1.2)
BUN: 14 mg/dL (ref 6–20)
CALCIUM: 9.5 mg/dL (ref 8.9–10.3)
CO2: 31 mmol/L (ref 22–32)
Chloride: 104 mmol/L (ref 98–111)
Creatinine: 0.65 mg/dL (ref 0.44–1.00)
GFR, Est AFR Am: >60 mL/min (ref >60)
GFR, Estimated: >60 mL/min (ref >60)
GLUCOSE: 84 mg/dL (ref 70–99)
Potassium: 3.7 mmol/L (ref 3.5–5.1)
Sodium: 143 mmol/L (ref 135–145)
TOTAL PROTEIN: 6.9 g/dL (ref 6.5–8.1)

## 2018-04-08 MED ORDER — SODIUM CHLORIDE 0.9 % IV SOLN
399.0000 mg | Freq: Once | INTRAVENOUS | Status: AC
  Administered 2018-04-08: 399 mg via INTRAVENOUS
  Filled 2018-04-08: qty 19

## 2018-04-08 MED ORDER — ACETAMINOPHEN 325 MG PO TABS
650.0000 mg | ORAL_TABLET | Freq: Once | ORAL | Status: AC
  Administered 2018-04-08: 650 mg via ORAL

## 2018-04-08 MED ORDER — HEPARIN SOD (PORK) LOCK FLUSH 100 UNIT/ML IV SOLN
500.0000 [IU] | Freq: Once | INTRAVENOUS | Status: DC | PRN
  Filled 2018-04-08: qty 5

## 2018-04-08 MED ORDER — DIPHENHYDRAMINE HCL 25 MG PO CAPS
ORAL_CAPSULE | ORAL | Status: AC
  Filled 2018-04-08: qty 1

## 2018-04-08 MED ORDER — LIDOCAINE-PRILOCAINE 2.5-2.5 % EX CREA: 1.0000 "application " | TOPICAL_CREAM | CUTANEOUS | 0 refills | Status: DC | PRN

## 2018-04-08 MED ORDER — HEPARIN SOD (PORK) LOCK FLUSH 100 UNIT/ML IV SOLN
500.0000 [IU] | Freq: Once | INTRAVENOUS | Status: DC
  Filled 2018-04-08: qty 5

## 2018-04-08 MED ORDER — DIPHENHYDRAMINE HCL 25 MG PO CAPS
25.0000 mg | ORAL_CAPSULE | Freq: Once | ORAL | Status: AC
  Administered 2018-04-08: 25 mg via ORAL

## 2018-04-08 MED ORDER — GABAPENTIN 300 MG PO CAPS: 300.0000 mg | ORAL_CAPSULE | Freq: Every day | ORAL | 4 refills | Status: DC

## 2018-04-08 MED ORDER — ACETAMINOPHEN 325 MG PO TABS
ORAL_TABLET | ORAL | Status: AC
  Filled 2018-04-08: qty 2

## 2018-04-08 MED ORDER — SODIUM CHLORIDE 0.9 % IV SOLN
Freq: Once | INTRAVENOUS | Status: AC
  Administered 2018-04-08: 13:00:00 via INTRAVENOUS

## 2018-04-08 MED ORDER — SODIUM CHLORIDE 0.9% FLUSH
10.0000 mL | INTRAVENOUS | Status: DC | PRN
  Filled 2018-04-08: qty 10

## 2018-04-08 MED ORDER — SODIUM CHLORIDE 0.9% FLUSH
10.0000 mL | Freq: Once | INTRAVENOUS | Status: AC
  Administered 2018-04-08: 10 mL
  Filled 2018-04-08: qty 10

## 2018-04-08 NOTE — Telephone Encounter (Signed)
Gave patient avs and calendar of upcoming appts. Patient scheduled per GM request.

## 2018-04-08 NOTE — Patient Instructions (Signed)
[facility] Discharge Instructions for Patients Receiving Chemotherapy  Today you received the following chemotherapy agents Herceptin  To help prevent nausea and vomiting after your treatment, we encourage you to take your nausea medication as directed   If you develop nausea and vomiting that is not controlled by your nausea medication, call the clinic.   BELOW ARE SYMPTOMS THAT SHOULD BE REPORTED IMMEDIATELY:  *FEVER GREATER THAN 100.5 F  *CHILLS WITH OR WITHOUT FEVER  NAUSEA AND VOMITING THAT IS NOT CONTROLLED WITH YOUR NAUSEA MEDICATION  *UNUSUAL SHORTNESS OF BREATH  *UNUSUAL BRUISING OR BLEEDING  TENDERNESS IN MOUTH AND THROAT WITH OR WITHOUT PRESENCE OF ULCERS  *URINARY PROBLEMS  *BOWEL PROBLEMS  UNUSUAL RASH Items with * indicate a potential emergency and should be followed up as soon as possible.  Feel free to call the clinic should you have any questions or concerns. The clinic phone number is [phone].  Please show the CHEMO ALERT CARD at check-in to the Emergency Department and triage nurse.   

## 2018-04-15 ENCOUNTER — Inpatient Hospital Stay: Payer: BC Managed Care – PPO

## 2018-04-15 VITALS — BP 113/77 | HR 73 | Temp 97.6°F | Resp 20

## 2018-04-15 DIAGNOSIS — Z95828 Presence of other vascular implants and grafts: Secondary | ICD-10-CM

## 2018-04-15 DIAGNOSIS — C50211 Malignant neoplasm of upper-inner quadrant of right female breast: Secondary | ICD-10-CM

## 2018-04-15 DIAGNOSIS — Z170 Estrogen receptor positive status [ER+]: Secondary | ICD-10-CM

## 2018-04-15 MED ORDER — GOSERELIN ACETATE 3.6 MG ~~LOC~~ IMPL
3.6000 mg | DRUG_IMPLANT | Freq: Once | SUBCUTANEOUS | Status: AC
  Administered 2018-04-15: 3.6 mg via SUBCUTANEOUS

## 2018-04-15 NOTE — Patient Instructions (Signed)
Goserelin injection What is this medicine? GOSERELIN (GOE se rel in) is similar to a hormone found in the body. It lowers the amount of sex hormones that the body makes. Men will have lower testosterone levels and women will have lower estrogen levels while taking this medicine. In men, this medicine is used to treat prostate cancer; the injection is either given once per month or once every 12 weeks. A once per month injection (only) is used to treat women with endometriosis, dysfunctional uterine bleeding, or advanced breast cancer. This medicine may be used for other purposes; ask your health care provider or pharmacist if you have questions. COMMON BRAND NAME(S): Zoladex What should I tell my health care provider before I take this medicine? They need to know if you have any of these conditions (some only apply to women): -diabetes -heart disease or previous heart attack -high blood pressure -high cholesterol -kidney disease -osteoporosis or low bone density -problems passing urine -spinal cord injury -stroke -tobacco smoker -an unusual or allergic reaction to goserelin, hormone therapy, other medicines, foods, dyes, or preservatives -pregnant or trying to get pregnant -breast-feeding How should I use this medicine? This medicine is for injection under the skin. It is given by a health care professional in a hospital or clinic setting. Men receive this injection once every 4 weeks or once every 12 weeks. Women will only receive the once every 4 weeks injection. Talk to your pediatrician regarding the use of this medicine in children. Special care may be needed. Overdosage: If you think you have taken too much of this medicine contact a poison control center or emergency room at once. NOTE: This medicine is only for you. Do not share this medicine with others. What if I miss a dose? It is important not to miss your dose. Call your doctor or health care professional if you are unable to  keep an appointment. What may interact with this medicine? -female hormones like estrogen -herbal or dietary supplements like black cohosh, chasteberry, or DHEA -female hormones like testosterone -prasterone This list may not describe all possible interactions. Give your health care provider a list of all the medicines, herbs, non-prescription drugs, or dietary supplements you use. Also tell them if you smoke, drink alcohol, or use illegal drugs. Some items may interact with your medicine. What should I watch for while using this medicine? Visit your doctor or health care professional for regular checks on your progress. Your symptoms may appear to get worse during the first weeks of this therapy. Tell your doctor or healthcare professional if your symptoms do not start to get better or if they get worse after this time. Your bones may get weaker if you take this medicine for a long time. If you smoke or frequently drink alcohol you may increase your risk of bone loss. A family history of osteoporosis, chronic use of drugs for seizures (convulsions), or corticosteroids can also increase your risk of bone loss. Talk to your doctor about how to keep your bones strong. This medicine should stop regular monthly menstration in women. Tell your doctor if you continue to [facility]. Women should not become pregnant while taking this medicine or for 12 weeks after stopping this medicine. Women should inform their doctor if they wish to become pregnant or think they might be pregnant. There is a potential for serious side effects to an unborn child. Talk to your health care professional or pharmacist for more information. Do not breast-feed an infant while taking  this medicine. Men should inform their doctors if they wish to father a child. This medicine may lower sperm counts. Talk to your health care professional or pharmacist for more information. What side effects may I notice from receiving this  medicine? Side effects that you should report to your doctor or health care professional as soon as possible: -allergic reactions like skin rash, itching or hives, swelling of the face, lips, or tongue -bone pain -breathing problems -changes in vision -chest pain -feeling faint or lightheaded, falls -fever, chills -pain, swelling, warmth in the leg -pain, tingling, numbness in the hands or feet -signs and symptoms of low blood pressure like dizziness; feeling faint or lightheaded, falls; unusually weak or tired -stomach pain -swelling of the ankles, feet, hands -trouble passing urine or change in the amount of urine -unusually high or low blood pressure -unusually weak or tired Side effects that usually do not require medical attention (report to your doctor or health care professional if they continue or are bothersome): -change in sex drive or performance -changes in breast size in both males and females -changes in emotions or moods -headache -hot flashes -irritation at site where injected -loss of appetite -skin problems like acne, dry skin -vaginal dryness This list may not describe all possible side effects. Call your doctor for medical advice about side effects. You may report side effects to FDA at 1-800-FDA-1088. Where should I keep my medicine? This drug is given in a hospital or clinic and will not be stored at home. NOTE: This sheet is a summary. It may not cover all possible information. If you have questions about this medicine, talk to your doctor, pharmacist, or health care provider.  2018 Elsevier/Gold Standard (2013-11-21 11:10:35)

## 2018-04-28 ENCOUNTER — Other Ambulatory Visit: Payer: Self-pay | Admitting: *Deleted

## 2018-04-29 ENCOUNTER — Encounter: Payer: Self-pay | Admitting: *Deleted

## 2018-04-29 ENCOUNTER — Other Ambulatory Visit: Payer: Self-pay | Admitting: Adult Health

## 2018-04-29 ENCOUNTER — Inpatient Hospital Stay: Payer: BC Managed Care – PPO

## 2018-04-29 ENCOUNTER — Inpatient Hospital Stay: Payer: BC Managed Care – PPO | Attending: Oncology

## 2018-04-29 VITALS — BP 112/73 | HR 74 | Temp 98.1°F | Resp 17

## 2018-04-29 DIAGNOSIS — Z79818 Long term (current) use of other agents affecting estrogen receptors and estrogen levels: Secondary | ICD-10-CM | POA: Insufficient documentation

## 2018-04-29 DIAGNOSIS — C50211 Malignant neoplasm of upper-inner quadrant of right female breast: Secondary | ICD-10-CM

## 2018-04-29 DIAGNOSIS — Z5112 Encounter for antineoplastic immunotherapy: Secondary | ICD-10-CM | POA: Insufficient documentation

## 2018-04-29 DIAGNOSIS — Z170 Estrogen receptor positive status [ER+]: Secondary | ICD-10-CM

## 2018-04-29 DIAGNOSIS — Z79811 Long term (current) use of aromatase inhibitors: Secondary | ICD-10-CM | POA: Diagnosis not present

## 2018-04-29 DIAGNOSIS — Z9221 Personal history of antineoplastic chemotherapy: Secondary | ICD-10-CM | POA: Diagnosis not present

## 2018-04-29 DIAGNOSIS — Z95828 Presence of other vascular implants and grafts: Secondary | ICD-10-CM

## 2018-04-29 DIAGNOSIS — Z923 Personal history of irradiation: Secondary | ICD-10-CM | POA: Insufficient documentation

## 2018-04-29 LAB — CBC WITH DIFFERENTIAL (CANCER CENTER ONLY)
BASOS ABS: 0 10*3/uL (ref 0.0–0.1)
Basophils Relative: 1 %
Eosinophils Absolute: 0.5 10*3/uL (ref 0.0–0.5)
Eosinophils Relative: 11 %
HEMATOCRIT: 35.7 % (ref 34.8–46.6)
Hemoglobin: 12.4 g/dL (ref 11.6–15.9)
LYMPHS PCT: 12 %
Lymphs Abs: 0.5 10*3/uL — ABNORMAL LOW (ref 0.9–3.3)
MCH: 33.2 pg (ref 25.1–34.0)
MCHC: 34.7 g/dL (ref 31.5–36.0)
MCV: 95.7 fL (ref 79.5–101.0)
Monocytes Absolute: 0.4 10*3/uL (ref 0.1–0.9)
Monocytes Relative: 8 %
NEUTROS ABS: 3.1 10*3/uL (ref 1.5–6.5)
Neutrophils Relative %: 68 %
Platelet Count: 190 10*3/uL (ref 145–400)
RBC: 3.73 MIL/uL (ref 3.70–5.45)
RDW: 14.1 % (ref 11.2–14.5)
WBC: 4.5 10*3/uL (ref 3.9–10.3)

## 2018-04-29 LAB — CMP (CANCER CENTER ONLY)
ALBUMIN: 3.9 g/dL (ref 3.5–5.0)
ALT: 16 U/L (ref 0–44)
ANION GAP: 9 (ref 5–15)
AST: 17 U/L (ref 15–41)
Alkaline Phosphatase: 82 U/L (ref 38–126)
BILIRUBIN TOTAL: 0.7 mg/dL (ref 0.3–1.2)
BUN: 17 mg/dL (ref 6–20)
CO2: 27 mmol/L (ref 22–32)
Calcium: 9.1 mg/dL (ref 8.9–10.3)
Chloride: 106 mmol/L (ref 98–111)
Creatinine: 0.61 mg/dL (ref 0.44–1.00)
GFR, Est AFR Am: >60 mL/min (ref >60)
GLUCOSE: 95 mg/dL (ref 70–99)
POTASSIUM: 3.9 mmol/L (ref 3.5–5.1)
Sodium: 142 mmol/L (ref 135–145)
TOTAL PROTEIN: 6.6 g/dL (ref 6.5–8.1)

## 2018-04-29 MED ORDER — ACETAMINOPHEN 325 MG PO TABS
650.0000 mg | ORAL_TABLET | Freq: Once | ORAL | Status: AC
  Administered 2018-04-29: 650 mg via ORAL

## 2018-04-29 MED ORDER — ACETAMINOPHEN 325 MG PO TABS
ORAL_TABLET | ORAL | Status: AC
  Filled 2018-04-29: qty 2

## 2018-04-29 MED ORDER — DIPHENHYDRAMINE HCL 25 MG PO CAPS
25.0000 mg | ORAL_CAPSULE | Freq: Once | ORAL | Status: AC
  Administered 2018-04-29: 25 mg via ORAL

## 2018-04-29 MED ORDER — DIPHENHYDRAMINE HCL 25 MG PO CAPS
ORAL_CAPSULE | ORAL | Status: AC
  Filled 2018-04-29: qty 1

## 2018-04-29 MED ORDER — SODIUM CHLORIDE 0.9% FLUSH
10.0000 mL | Freq: Once | INTRAVENOUS | Status: AC
  Administered 2018-04-29: 10 mL
  Filled 2018-04-29: qty 10

## 2018-04-29 MED ORDER — SODIUM CHLORIDE 0.9 % IV SOLN
Freq: Once | INTRAVENOUS | Status: AC
  Administered 2018-04-29: 11:00:00 via INTRAVENOUS
  Filled 2018-04-29: qty 250

## 2018-04-29 MED ORDER — TRASTUZUMAB CHEMO 150 MG IV SOLR
399.0000 mg | Freq: Once | INTRAVENOUS | Status: AC
  Administered 2018-04-29: 399 mg via INTRAVENOUS
  Filled 2018-04-29: qty 19

## 2018-04-29 MED ORDER — SODIUM CHLORIDE 0.9% FLUSH
10.0000 mL | INTRAVENOUS | Status: DC | PRN
  Administered 2018-04-29: 10 mL
  Filled 2018-04-29: qty 10

## 2018-04-29 MED ORDER — HEPARIN SOD (PORK) LOCK FLUSH 100 UNIT/ML IV SOLN
500.0000 [IU] | Freq: Once | INTRAVENOUS | Status: AC | PRN
  Administered 2018-04-29: 500 [IU]
  Filled 2018-04-29: qty 5

## 2018-04-29 NOTE — Patient Instructions (Signed)
[facility] Discharge Instructions for Patients Receiving Chemotherapy Today you received the following chemotherapy agents:  Herceptin To help prevent nausea and vomiting after your treatment, we encourage you to take your nausea medication as prescribed.   If you develop nausea and vomiting that is not controlled by your nausea medication, call the clinic.   BELOW ARE SYMPTOMS THAT SHOULD BE REPORTED IMMEDIATELY:  *FEVER GREATER THAN 100.5 F  *CHILLS WITH OR WITHOUT FEVER  NAUSEA AND VOMITING THAT IS NOT CONTROLLED WITH YOUR NAUSEA MEDICATION  *UNUSUAL SHORTNESS OF BREATH  *UNUSUAL BRUISING OR BLEEDING  TENDERNESS IN MOUTH AND THROAT WITH OR WITHOUT PRESENCE OF ULCERS  *URINARY PROBLEMS  *BOWEL PROBLEMS  UNUSUAL RASH Items with * indicate a potential emergency and should be followed up as soon as possible.  Feel free to call the clinic should you have any questions or concerns. The clinic phone number is [phone].  Please show the CHEMO ALERT CARD at check-in to the Emergency Department and triage nurse.   

## 2018-05-02 ENCOUNTER — Other Ambulatory Visit: Payer: Self-pay

## 2018-05-02 ENCOUNTER — Encounter: Payer: Self-pay | Admitting: Radiation Oncology

## 2018-05-02 ENCOUNTER — Ambulatory Visit
Admission: RE | Admit: 2018-05-02 | Discharge: 2018-05-02 | Disposition: A | Discharge status: Home / Self Care | Payer: BC Managed Care – PPO | Source: Ambulatory Visit | Attending: Radiation Oncology | Admitting: Radiation Oncology

## 2018-05-02 VITALS — BP 117/85 | HR 68 | Temp 97.8°F | Resp 18 | Ht 66.0 in | Wt 142.2 lb

## 2018-05-02 DIAGNOSIS — C50211 Malignant neoplasm of upper-inner quadrant of right female breast: Secondary | ICD-10-CM

## 2018-05-02 DIAGNOSIS — Z170 Estrogen receptor positive status [ER+]: Secondary | ICD-10-CM

## 2018-05-02 DIAGNOSIS — Z79899 Other long term (current) drug therapy: Secondary | ICD-10-CM | POA: Insufficient documentation

## 2018-05-02 DIAGNOSIS — C50911 Malignant neoplasm of unspecified site of right female breast: Secondary | ICD-10-CM | POA: Diagnosis present

## 2018-05-02 DIAGNOSIS — Z888 Allergy status to other drugs, medicaments and biological substances status: Secondary | ICD-10-CM | POA: Diagnosis not present

## 2018-05-02 NOTE — Progress Notes (Signed)
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  Interval Since Last Radiation: 7 weeks   02/03/2018 - 03/18/2018: The patient initially received a dose of 50.4 Gy in 28 fractions to the breast using whole-breast tangent fields. This was delivered using a 3-D conformal technique. The patient then received a boost to the seroma. This delivered an additional 10 Gy in 5 fractions using 9e electrons with a special teletherapy technique. The total dose was 60.4 Gy.   Narrative:  The patient returns today for routine follow-up. During treatment she did very well with radiotherapy and did not have significant desquamation.                             On review of systems, the patient states she's doing great from a skin perspective. She reports vaginal dryness from Zoladex. She reports this can make intercourse difficult, and she's using coconut oil for vaginal moisture. No other complaints are noted.  ALLERGIES:  is allergic to adhesive [tape]; milk-related compounds; and poison ivy extract [poison ivy extract].  Meds: Current Outpatient Medications  Medication Sig Dispense Refill  . anastrozole (ARIMIDEX) 1 MG tablet Take 1 tablet (1 mg total) by mouth daily. 30 tablet 6  . Ascorbic Acid (VITAMIN C PO) Take 400 Units by mouth 2 (two) times daily.    Marland Kitchen b complex vitamins tablet Take 1 tablet by mouth daily.    . Cholecalciferol (VITAMIN D3) 5000 units TABS Take 10,000 Units by mouth daily.    . fish oil-omega-3 fatty acids 1000 MG capsule Take 1 g by mouth daily.    Marland Kitchen gabapentin (NEURONTIN) 300 MG capsule Take 1 capsule (300 mg total) by mouth at bedtime. 90 capsule 4  . lidocaine-prilocaine (EMLA) cream Apply  1 application topically as needed. 30 g 0  . OVER THE COUNTER MEDICATION Take 1 tablet by mouth at bedtime as needed (constipation).     . TURMERIC PO Take 1 capsule by mouth as directed.     Marland Kitchen UNABLE TO FIND Take 500 mg by mouth daily. [place] mushroom    . UNABLE TO FIND Take 500 mg by mouth daily. [place] Mushroom    . UNABLE TO FIND Take 5 mg by mouth as directed. CBD Oil; patient takes for nausea     . guaiFENesin (MUCINEX) 600 MG 12 hr tablet Take 600 mg by mouth 2 (two) times daily.    . promethazine-dextromethorphan (PROMETHAZINE-DM) 6.25-15 MG/5ML syrup Take 5 mLs by mouth 4 (four) times daily as needed for cough. (Patient not taking: Reported on 05/02/2018) 240 mL 1   No current facility-administered medications for this encounter.     Physical Findings:  height is 5' 6"  (1.676 m) and weight is 142 lb 3.2 oz (64.5 kg). Her oral temperature is 97.8 F (36.6 C). Her blood pressure is 117/85 and her pulse is 68. Her respiration is 18 and oxygen saturation is 100%.  Pain Assessment Pain Score: 0-No pain/10 In general this is a well appearing caucasian female in no acute distress. She's alert and oriented x4 and appropriate throughout the examination. Cardiopulmonary assessment is negative for acute distress and she  exhibits normal effort. The right breast was examined and reveals very mild hyperpigmentation along the areolar area. Her incisions are healing well. Along the left breast at the 11:00 position she has a healing eschar along the suture line. No erythema is otherwise noted.  Lab Findings: Lab Results  Component Value Date   WBC 4.5 04/29/2018   HGB 12.4 04/29/2018   HCT 35.7 04/29/2018   MCV 95.7 04/29/2018   PLT 190 04/29/2018     Radiographic Findings: No results found.  Impression/Plan: 1. 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 We discussed that we  would be happy to continue to follow her as needed, but she will also continue to follow up with Dr. Jana Hakim in medical oncology. She was counseled on skin care as well as measures to avoid sun exposure to this area.  2. Survivorship. We discussed the importance of survivorship evaluation and she is not currently, but at the appropriate interval will be scheduled for this. She was also given the monthly calendar for access to resources offered within the cancer center. 3. Vaginal dryness. This is due to her hormone blockade. I reiterated the options of coconut oil or vitamin E, and did give the patient a set of vaginal dilators to try to dilate the introitus for more tolerable intimacy.     Carola Rhine, PAC

## 2018-05-13 ENCOUNTER — Inpatient Hospital Stay: Payer: BC Managed Care – PPO

## 2018-05-13 ENCOUNTER — Ambulatory Visit: Payer: BC Managed Care – PPO

## 2018-05-13 VITALS — BP 106/69 | HR 69 | Temp 97.7°F | Resp 18

## 2018-05-13 DIAGNOSIS — Z95828 Presence of other vascular implants and grafts: Secondary | ICD-10-CM

## 2018-05-13 DIAGNOSIS — Z170 Estrogen receptor positive status [ER+]: Secondary | ICD-10-CM

## 2018-05-13 DIAGNOSIS — C50211 Malignant neoplasm of upper-inner quadrant of right female breast: Secondary | ICD-10-CM

## 2018-05-13 MED ORDER — GOSERELIN ACETATE 3.6 MG ~~LOC~~ IMPL
3.6000 mg | DRUG_IMPLANT | Freq: Once | SUBCUTANEOUS | Status: AC
  Administered 2018-05-13: 3.6 mg via SUBCUTANEOUS

## 2018-05-13 MED ORDER — GOSERELIN ACETATE 3.6 MG ~~LOC~~ IMPL
DRUG_IMPLANT | SUBCUTANEOUS | Status: AC
  Filled 2018-05-13: qty 3.6

## 2018-05-19 ENCOUNTER — Other Ambulatory Visit: Payer: Self-pay

## 2018-05-19 DIAGNOSIS — C50211 Malignant neoplasm of upper-inner quadrant of right female breast: Secondary | ICD-10-CM

## 2018-05-19 DIAGNOSIS — Z170 Estrogen receptor positive status [ER+]: Principal | ICD-10-CM

## 2018-05-20 ENCOUNTER — Inpatient Hospital Stay: Payer: BC Managed Care – PPO

## 2018-05-20 VITALS — BP 119/86 | HR 80 | Temp 98.4°F | Resp 16

## 2018-05-20 DIAGNOSIS — Z170 Estrogen receptor positive status [ER+]: Principal | ICD-10-CM

## 2018-05-20 DIAGNOSIS — Z95828 Presence of other vascular implants and grafts: Secondary | ICD-10-CM

## 2018-05-20 DIAGNOSIS — C50211 Malignant neoplasm of upper-inner quadrant of right female breast: Secondary | ICD-10-CM

## 2018-05-20 LAB — CMP (CANCER CENTER ONLY)
ALBUMIN: 3.8 g/dL (ref 3.5–5.0)
ALK PHOS: 86 U/L (ref 38–126)
ALT: 21 U/L (ref 0–44)
AST: 20 U/L (ref 15–41)
Anion gap: 6 (ref 5–15)
BILIRUBIN TOTAL: 0.6 mg/dL (ref 0.3–1.2)
BUN: 17 mg/dL (ref 6–20)
CO2: 31 mmol/L (ref 22–32)
CREATININE: 0.63 mg/dL (ref 0.44–1.00)
Calcium: 9.4 mg/dL (ref 8.9–10.3)
Chloride: 106 mmol/L (ref 98–111)
GFR, Est AFR Am: >60 mL/min (ref >60)
GFR, Estimated: >60 mL/min (ref >60)
GLUCOSE: 98 mg/dL (ref 70–99)
POTASSIUM: 4 mmol/L (ref 3.5–5.1)
Sodium: 143 mmol/L (ref 135–145)
TOTAL PROTEIN: 6.4 g/dL — AB (ref 6.5–8.1)

## 2018-05-20 LAB — CBC WITH DIFFERENTIAL (CANCER CENTER ONLY)
BASOS ABS: 0 10*3/uL (ref 0.0–0.1)
Basophils Relative: 0 %
EOS PCT: 6 %
Eosinophils Absolute: 0.3 10*3/uL (ref 0.0–0.5)
HCT: 34.7 % — ABNORMAL LOW (ref 34.8–46.6)
Hemoglobin: 11.7 g/dL (ref 11.6–15.9)
LYMPHS PCT: 16 %
Lymphs Abs: 0.7 10*3/uL — ABNORMAL LOW (ref 0.9–3.3)
MCH: 32.6 pg (ref 25.1–34.0)
MCHC: 33.7 g/dL (ref 31.5–36.0)
MCV: 96.7 fL (ref 79.5–101.0)
MONO ABS: 0.3 10*3/uL (ref 0.1–0.9)
Monocytes Relative: 7 %
Neutro Abs: 3.1 10*3/uL (ref 1.5–6.5)
Neutrophils Relative %: 71 %
PLATELETS: 206 10*3/uL (ref 145–400)
RBC: 3.59 MIL/uL — ABNORMAL LOW (ref 3.70–5.45)
RDW: 14 % (ref 11.2–14.5)
WBC Count: 4.5 10*3/uL (ref 3.9–10.3)

## 2018-05-20 MED ORDER — SODIUM CHLORIDE 0.9% FLUSH
10.0000 mL | INTRAVENOUS | Status: DC | PRN
  Administered 2018-05-20: 10 mL
  Filled 2018-05-20: qty 10

## 2018-05-20 MED ORDER — ACETAMINOPHEN 325 MG PO TABS
650.0000 mg | ORAL_TABLET | Freq: Once | ORAL | Status: AC
  Administered 2018-05-20: 650 mg via ORAL

## 2018-05-20 MED ORDER — SODIUM CHLORIDE 0.9% FLUSH
10.0000 mL | Freq: Once | INTRAVENOUS | Status: AC
  Administered 2018-05-20: 10 mL
  Filled 2018-05-20: qty 10

## 2018-05-20 MED ORDER — DIPHENHYDRAMINE HCL 25 MG PO CAPS
25.0000 mg | ORAL_CAPSULE | Freq: Once | ORAL | Status: AC
  Administered 2018-05-20: 25 mg via ORAL

## 2018-05-20 MED ORDER — ACETAMINOPHEN 325 MG PO TABS
ORAL_TABLET | ORAL | Status: AC
  Filled 2018-05-20: qty 2

## 2018-05-20 MED ORDER — DIPHENHYDRAMINE HCL 25 MG PO CAPS
ORAL_CAPSULE | ORAL | Status: AC
  Filled 2018-05-20: qty 1

## 2018-05-20 MED ORDER — HEPARIN SOD (PORK) LOCK FLUSH 100 UNIT/ML IV SOLN
500.0000 [IU] | Freq: Once | INTRAVENOUS | Status: AC | PRN
  Administered 2018-05-20: 500 [IU]
  Filled 2018-05-20: qty 5

## 2018-05-20 MED ORDER — TRASTUZUMAB CHEMO 150 MG IV SOLR
6.0000 mg/kg | Freq: Once | INTRAVENOUS | Status: AC
  Administered 2018-05-20: 399 mg via INTRAVENOUS
  Filled 2018-05-20: qty 19

## 2018-05-20 MED ORDER — SODIUM CHLORIDE 0.9 % IV SOLN
Freq: Once | INTRAVENOUS | Status: AC
  Administered 2018-05-20: 13:00:00 via INTRAVENOUS
  Filled 2018-05-20: qty 250

## 2018-05-20 NOTE — Patient Instructions (Signed)
[facility] Discharge Instructions for Patients Receiving Chemotherapy  Today you received the following chemotherapy agents herceptin.  To help prevent nausea and vomiting after your treatment, we encourage you to take your nausea medication as directed.   If you develop nausea and vomiting that is not controlled by your nausea medication, call the clinic.   BELOW ARE SYMPTOMS THAT SHOULD BE REPORTED IMMEDIATELY:  *FEVER GREATER THAN 100.5 F  *CHILLS WITH OR WITHOUT FEVER  NAUSEA AND VOMITING THAT IS NOT CONTROLLED WITH YOUR NAUSEA MEDICATION  *UNUSUAL SHORTNESS OF BREATH  *UNUSUAL BRUISING OR BLEEDING  TENDERNESS IN MOUTH AND THROAT WITH OR WITHOUT PRESENCE OF ULCERS  *URINARY PROBLEMS  *BOWEL PROBLEMS  UNUSUAL RASH Items with * indicate a potential emergency and should be followed up as soon as possible.  Feel free to call the clinic should you have any questions or concerns. The clinic phone number is [phone].  Please show the [facility] at check-in to the Emergency Department and triage nurse.

## 2018-06-10 ENCOUNTER — Inpatient Hospital Stay: Payer: BC Managed Care – PPO

## 2018-06-10 ENCOUNTER — Inpatient Hospital Stay: Payer: BC Managed Care – PPO | Attending: Oncology

## 2018-06-10 ENCOUNTER — Ambulatory Visit: Payer: BC Managed Care – PPO

## 2018-06-10 VITALS — BP 117/84 | HR 70 | Temp 98.5°F | Resp 17 | Ht 66.0 in | Wt 138.0 lb

## 2018-06-10 DIAGNOSIS — Z5112 Encounter for antineoplastic immunotherapy: Secondary | ICD-10-CM | POA: Insufficient documentation

## 2018-06-10 DIAGNOSIS — Z95828 Presence of other vascular implants and grafts: Secondary | ICD-10-CM

## 2018-06-10 DIAGNOSIS — C50211 Malignant neoplasm of upper-inner quadrant of right female breast: Secondary | ICD-10-CM

## 2018-06-10 DIAGNOSIS — Z170 Estrogen receptor positive status [ER+]: Principal | ICD-10-CM

## 2018-06-10 DIAGNOSIS — Z79818 Long term (current) use of other agents affecting estrogen receptors and estrogen levels: Secondary | ICD-10-CM | POA: Insufficient documentation

## 2018-06-10 DIAGNOSIS — Z9221 Personal history of antineoplastic chemotherapy: Secondary | ICD-10-CM | POA: Diagnosis not present

## 2018-06-10 LAB — CMP (CANCER CENTER ONLY)
ALK PHOS: 88 U/L (ref 38–126)
ALT: 17 U/L (ref 0–44)
AST: 20 U/L (ref 15–41)
Albumin: 4 g/dL (ref 3.5–5.0)
Anion gap: 8 (ref 5–15)
BUN: 12 mg/dL (ref 6–20)
CHLORIDE: 105 mmol/L (ref 98–111)
CO2: 29 mmol/L (ref 22–32)
CREATININE: 0.67 mg/dL (ref 0.44–1.00)
Calcium: 9.3 mg/dL (ref 8.9–10.3)
GFR, Est AFR Am: >60 mL/min (ref >60)
Glucose, Bld: 112 mg/dL — ABNORMAL HIGH (ref 70–99)
Potassium: 3.8 mmol/L (ref 3.5–5.1)
Sodium: 142 mmol/L (ref 135–145)
Total Bilirubin: 0.7 mg/dL (ref 0.3–1.2)
Total Protein: 6.6 g/dL (ref 6.5–8.1)

## 2018-06-10 LAB — CBC WITH DIFFERENTIAL (CANCER CENTER ONLY)
BASOS PCT: 0 %
Basophils Absolute: 0 10*3/uL (ref 0.0–0.1)
Eosinophils Absolute: 0.1 10*3/uL (ref 0.0–0.5)
Eosinophils Relative: 3 %
HCT: 35.9 % (ref 34.8–46.6)
HEMOGLOBIN: 12.3 g/dL (ref 11.6–15.9)
LYMPHS ABS: 0.9 10*3/uL (ref 0.9–3.3)
Lymphocytes Relative: 22 %
MCH: 33.4 pg (ref 25.1–34.0)
MCHC: 34.3 g/dL (ref 31.5–36.0)
MCV: 97.6 fL (ref 79.5–101.0)
Monocytes Absolute: 0.3 10*3/uL (ref 0.1–0.9)
Monocytes Relative: 8 %
Neutro Abs: 2.7 10*3/uL (ref 1.5–6.5)
Neutrophils Relative %: 67 %
Platelet Count: 195 10*3/uL (ref 145–400)
RBC: 3.68 MIL/uL — AB (ref 3.70–5.45)
RDW: 13.7 % (ref 11.2–14.5)
WBC Count: 4.1 10*3/uL (ref 3.9–10.3)

## 2018-06-10 MED ORDER — HEPARIN SOD (PORK) LOCK FLUSH 100 UNIT/ML IV SOLN
500.0000 [IU] | Freq: Once | INTRAVENOUS | Status: AC | PRN
  Administered 2018-06-10: 500 [IU]
  Filled 2018-06-10: qty 5

## 2018-06-10 MED ORDER — SODIUM CHLORIDE 0.9 % IV SOLN
Freq: Once | INTRAVENOUS | Status: AC
  Administered 2018-06-10: 16:00:00 via INTRAVENOUS
  Filled 2018-06-10: qty 250

## 2018-06-10 MED ORDER — ACETAMINOPHEN 325 MG PO TABS
ORAL_TABLET | ORAL | Status: AC
  Filled 2018-06-10: qty 2

## 2018-06-10 MED ORDER — TRASTUZUMAB CHEMO 150 MG IV SOLR
399.0000 mg | Freq: Once | INTRAVENOUS | Status: AC
  Administered 2018-06-10: 399 mg via INTRAVENOUS
  Filled 2018-06-10: qty 19

## 2018-06-10 MED ORDER — FULVESTRANT 250 MG/5ML IM SOLN
INTRAMUSCULAR | Status: AC
  Filled 2018-06-10: qty 10

## 2018-06-10 MED ORDER — SODIUM CHLORIDE 0.9% FLUSH
10.0000 mL | Freq: Once | INTRAVENOUS | Status: AC
  Administered 2018-06-10: 10 mL
  Filled 2018-06-10: qty 10

## 2018-06-10 MED ORDER — DIPHENHYDRAMINE HCL 25 MG PO CAPS
25.0000 mg | ORAL_CAPSULE | Freq: Once | ORAL | Status: AC
  Administered 2018-06-10: 25 mg via ORAL

## 2018-06-10 MED ORDER — SODIUM CHLORIDE 0.9% FLUSH
10.0000 mL | INTRAVENOUS | Status: DC | PRN
  Administered 2018-06-10: 10 mL
  Filled 2018-06-10: qty 10

## 2018-06-10 MED ORDER — DIPHENHYDRAMINE HCL 25 MG PO CAPS
ORAL_CAPSULE | ORAL | Status: AC
  Filled 2018-06-10: qty 1

## 2018-06-10 MED ORDER — GOSERELIN ACETATE 3.6 MG ~~LOC~~ IMPL
3.6000 mg | DRUG_IMPLANT | Freq: Once | SUBCUTANEOUS | Status: AC
  Administered 2018-06-10: 3.6 mg via SUBCUTANEOUS

## 2018-06-10 MED ORDER — ACETAMINOPHEN 325 MG PO TABS
650.0000 mg | ORAL_TABLET | Freq: Once | ORAL | Status: AC
  Administered 2018-06-10: 650 mg via ORAL

## 2018-06-10 MED ORDER — GOSERELIN ACETATE 3.6 MG ~~LOC~~ IMPL
DRUG_IMPLANT | SUBCUTANEOUS | Status: AC
  Filled 2018-06-10: qty 3.6

## 2018-06-10 NOTE — Patient Instructions (Addendum)
[facility] Discharge Instructions for Patients Receiving Chemotherapy  Today you received the following chemotherapy agents: Trastuzumab (Herceptin)  To help prevent nausea and vomiting after your treatment, we encourage you to take your nausea medication as directed.    If you develop nausea and vomiting that is not controlled by your nausea medication, call the clinic.   BELOW ARE SYMPTOMS THAT SHOULD BE REPORTED IMMEDIATELY:  *FEVER GREATER THAN 100.5 F  *CHILLS WITH OR WITHOUT FEVER  NAUSEA AND VOMITING THAT IS NOT CONTROLLED WITH YOUR NAUSEA MEDICATION  *UNUSUAL SHORTNESS OF BREATH  *UNUSUAL BRUISING OR BLEEDING  TENDERNESS IN MOUTH AND THROAT WITH OR WITHOUT PRESENCE OF ULCERS  *URINARY PROBLEMS  *BOWEL PROBLEMS  UNUSUAL RASH Items with * indicate a potential emergency and should be followed up as soon as possible.  Feel free to call the clinic should you have any questions or concerns. The clinic phone number is [phone].  Please show the [facility] at check-in to the Emergency Department and triage nurse.   Goserelin injection What is this medicine? GOSERELIN (GOE se rel in) is similar to a hormone found in the body. It lowers the amount of sex hormones that the body makes. Men will have lower testosterone levels and women will have lower estrogen levels while taking this medicine. In men, this medicine is used to treat prostate cancer; the injection is either given once per month or once every 12 weeks. A once per month injection (only) is used to treat women with endometriosis, dysfunctional uterine bleeding, or advanced breast cancer. This medicine may be used for other purposes; ask your health care provider or pharmacist if you have questions. COMMON BRAND NAME(S): Zoladex What should I tell my health care provider before I take this medicine? They need to know if you have any of these conditions (some only apply to  women): -diabetes -heart disease or previous heart attack -high blood pressure -high cholesterol -kidney disease -osteoporosis or low bone density -problems passing urine -spinal cord injury -stroke -tobacco smoker -an unusual or allergic reaction to goserelin, hormone therapy, other medicines, foods, dyes, or preservatives -pregnant or trying to get pregnant -breast-feeding How should I use this medicine? This medicine is for injection under the skin. It is given by a health care professional in a hospital or clinic setting. Men receive this injection once every 4 weeks or once every 12 weeks. Women will only receive the once every 4 weeks injection. Talk to your pediatrician regarding the use of this medicine in children. Special care may be needed. Overdosage: If you think you have taken too much of this medicine contact a poison control center or emergency room at once. NOTE: This medicine is only for you. Do not share this medicine with others. What if I miss a dose? It is important not to miss your dose. Call your doctor or health care professional if you are unable to keep an appointment. What may interact with this medicine? -female hormones like estrogen -herbal or dietary supplements like black cohosh, chasteberry, or DHEA -female hormones like testosterone -prasterone This list may not describe all possible interactions. Give your health care provider a list of all the medicines, herbs, non-prescription drugs, or dietary supplements you use. Also tell them if you smoke, drink alcohol, or use illegal drugs. Some items may interact with your medicine. What should I watch for while using this medicine? Visit your doctor or health care professional for regular checks on your progress.  Your symptoms may appear to get worse during the first weeks of this therapy. Tell your doctor or healthcare professional if your symptoms do not start to get better or if they get worse after this  time. Your bones may get weaker if you take this medicine for a long time. If you smoke or frequently drink alcohol you may increase your risk of bone loss. A family history of osteoporosis, chronic use of drugs for seizures (convulsions), or corticosteroids can also increase your risk of bone loss. Talk to your doctor about how to keep your bones strong. This medicine should stop regular monthly menstration in women. Tell your doctor if you continue to [facility]. Women should not become pregnant while taking this medicine or for 12 weeks after stopping this medicine. Women should inform their doctor if they wish to become pregnant or think they might be pregnant. There is a potential for serious side effects to an unborn child. Talk to your health care professional or pharmacist for more information. Do not breast-feed an infant while taking this medicine. Men should inform their doctors if they wish to father a child. This medicine may lower sperm counts. Talk to your health care professional or pharmacist for more information. What side effects may I notice from receiving this medicine? Side effects that you should report to your doctor or health care professional as soon as possible: -allergic reactions like skin rash, itching or hives, swelling of the face, lips, or tongue -bone pain -breathing problems -changes in vision -chest pain -feeling faint or lightheaded, falls -fever, chills -pain, swelling, warmth in the leg -pain, tingling, numbness in the hands or feet -signs and symptoms of low blood pressure like dizziness; feeling faint or lightheaded, falls; unusually weak or tired -stomach pain -swelling of the ankles, feet, hands -trouble passing urine or change in the amount of urine -unusually high or low blood pressure -unusually weak or tired Side effects that usually do not require medical attention (report to your doctor or health care professional if they continue or are  bothersome): -change in sex drive or performance -changes in breast size in both males and females -changes in emotions or moods -headache -hot flashes -irritation at site where injected -loss of appetite -skin problems like acne, dry skin -vaginal dryness This list may not describe all possible side effects. Call your doctor for medical advice about side effects. You may report side effects to FDA at 1-800-FDA-1088. Where should I keep my medicine? This drug is given in a hospital or clinic and will not be stored at home. NOTE: This sheet is a summary. It may not cover all possible information. If you have questions about this medicine, talk to your doctor, pharmacist, or health care provider.  2018 Elsevier/Gold Standard (2013-11-21 11:10:35)

## 2018-06-14 ENCOUNTER — Ambulatory Visit (HOSPITAL_COMMUNITY)
Admission: RE | Admit: 2018-06-14 | Discharge: 2018-06-14 | Disposition: A | Discharge status: Home / Self Care | Payer: BC Managed Care – PPO | Source: Ambulatory Visit | Attending: Internal Medicine | Admitting: Internal Medicine

## 2018-06-14 ENCOUNTER — Encounter (HOSPITAL_COMMUNITY): Payer: Self-pay | Admitting: Internal Medicine

## 2018-06-14 ENCOUNTER — Ambulatory Visit (HOSPITAL_BASED_OUTPATIENT_CLINIC_OR_DEPARTMENT_OTHER)
Admission: RE | Admit: 2018-06-14 | Discharge: 2018-06-14 | Disposition: A | Discharge status: Home / Self Care | Payer: BC Managed Care – PPO | Source: Ambulatory Visit | Attending: Internal Medicine | Admitting: Internal Medicine

## 2018-06-14 VITALS — BP 108/78 | HR 98 | Wt 145.0 lb

## 2018-06-14 DIAGNOSIS — Z170 Estrogen receptor positive status [ER+]: Secondary | ICD-10-CM | POA: Diagnosis not present

## 2018-06-14 DIAGNOSIS — I361 Nonrheumatic tricuspid (valve) insufficiency: Secondary | ICD-10-CM | POA: Diagnosis not present

## 2018-06-14 DIAGNOSIS — C50211 Malignant neoplasm of upper-inner quadrant of right female breast: Secondary | ICD-10-CM

## 2018-06-14 NOTE — Addendum Note (Signed)
Encounter addended by: Scarlette Calico, RN on: 06/14/2018 2:56 PM  Actions taken: Diagnosis association updated, Order list changed

## 2018-06-14 NOTE — Addendum Note (Signed)
Encounter addended by: Scarlette Calico, RN on: 06/14/2018 2:51 PM  Actions taken: Sign clinical note

## 2018-06-14 NOTE — Progress Notes (Signed)
  Echocardiogram 2D Echocardiogram has been performed.  Merrie Roof F 06/14/2018, 2:07 PM

## 2018-06-14 NOTE — Patient Instructions (Signed)
We will contact you in 4 months to schedule your next appointment and echocardiogram

## 2018-06-14 NOTE — Progress Notes (Signed)
[place]  Referring Physician: Magrinat   HPI:  Hannah Woodard is 47 y.o. female with right breast cancer referred by Dr. Jana Hakim for enrollment into the Cardio-Oncology program.  Biopsy of right breast mass 08/24/2017 showed ([id number]) invasive ductal carcinoma, grade 2, estrogen receptor 95% positive, progesterone receptor 100% positive, both with strong staining intensity, with an MIB-1 of 60%, and HER-2 amplified, with a signals ratio of 2.84 (full report pending).  ONCOLOGY HISTORY  (1) status post right lumpectomy and sentinel axillary lymph node sampling 10/07/2017 at [facility] for a pT1C pN0, stage IA invasive ductal carcinoma, grade 3, with negative margins             (a) 1 sentinel lymph node removed, bilateral oncoplastic breast reduction  (2) adjuvant chemotherapy consisting of paclitaxel weekly x12 and trastuzumab every 21 days STARTING 11/12/2017  (3) continue trastuzumab to complete a year (finished 2/20)  (4) has finished XRT in 6/19  (5) adjuvant antiestrogens to follow at the completion of local treatment  (5) goserelin for fertility preservation started 09/29/2017  She presents today for cardio-oncology follow up. Doing well with Herceptin. Tolerating infusions well. Working with Physiological scientist. No undue SOB. Main issue is that she is having leg cramps at night. Continues to walk her dog.  Echo 03/11/18: EF 60-65% GLS -21.0%. LS 10.2 cm/s.  ECHO 3/19: 05-69% normal diastolic function  GLS -79.4% Echo 06/14/18: EF 80-16% normal diastolic function GLS - 55.3% Personally reviewed   Review of systems complete and found to be negative unless listed in HPI.   Past Medical History:  Diagnosis Date  . Breast cancer ([place])   . Family history of ovarian cancer 09/02/2017  . Family history of prostate cancer in father 09/02/2017  . Family history of uterine cancer 09/02/2017  . Plantar fasciitis     Current Outpatient Medications    Medication Sig Dispense Refill  . anastrozole (ARIMIDEX) 1 MG tablet Take 1 tablet (1 mg total) by mouth daily. 30 tablet 6  . Ascorbic Acid (VITAMIN C PO) Take 400 Units by mouth 2 (two) times daily.    Marland Kitchen b complex vitamins tablet Take 1 tablet by mouth daily.    . Cholecalciferol (VITAMIN D3) 5000 units TABS Take 10,000 Units by mouth daily.    . fish oil-omega-3 fatty acids 1000 MG capsule Take 1 g by mouth daily.    Marland Kitchen gabapentin (NEURONTIN) 300 MG capsule Take 1 capsule (300 mg total) by mouth at bedtime. 90 capsule 4  . guaiFENesin (MUCINEX) 600 MG 12 hr tablet Take 600 mg by mouth 2 (two) times daily.    Marland Kitchen lidocaine-prilocaine (EMLA) cream Apply 1 application topically as needed. 30 g 0  . OVER THE COUNTER MEDICATION Take 1 tablet by mouth at bedtime as needed (constipation).     . promethazine-dextromethorphan (PROMETHAZINE-DM) 6.25-15 MG/5ML syrup Take 5 mLs by mouth 4 (four) times daily as needed for cough. (Patient not taking: Reported on 05/02/2018) 240 mL 1  . TURMERIC PO Take 1 capsule by mouth as directed.     Marland Kitchen UNABLE TO FIND Take 500 mg by mouth daily. [place] mushroom    . UNABLE TO FIND Take 500 mg by mouth daily. [place] Mushroom    . UNABLE TO FIND Take 5 mg by mouth as directed. CBD Oil; patient takes for nausea      No current facility-administered medications for this visit.     Allergies  Allergen Reactions  . Adhesive [Tape]   .  Milk-Related Compounds Diarrhea    Stomach cramping  . Poison Ivy Extract [Poison Ivy Extract] Hives, Itching and Rash      Social History   Socioeconomic History  . Marital status: Single    Spouse name: Not on file  . Number of children: Not on file  . Years of education: Not on file  . Highest education level: Not on file  Occupational History  . Not on file  Social Needs  . Financial resource strain: Not on file  . Food insecurity:    Worry: Not on file    Inability: Not on file  . Transportation needs:    Medical:  Not on file    Non-medical: Not on file  Tobacco Use  . Smoking status: Never Smoker  . Smokeless tobacco: Never Used  Substance and Sexual Activity  . Alcohol use: Yes  . Drug use: No  . Sexual activity: Not on file  Lifestyle  . Physical activity:    Days per week: Not on file    Minutes per session: Not on file  . Stress: Not on file  Relationships  . Social connections:    Talks on phone: Not on file    Gets together: Not on file    Attends religious service: Not on file    Active member of club or organization: Not on file    Attends meetings of clubs or organizations: Not on file    Relationship status: Not on file  . Intimate partner violence:    Fear of current or ex partner: Not on file    Emotionally abused: Not on file    Physically abused: Not on file    Forced sexual activity: Not on file  Other Topics Concern  . Not on file  Social History Narrative  . Not on file      Family History  Problem Relation Age of Onset  . Ovarian cancer Mother 39  . Prostate cancer Father 68       'high gleason' unsure number  . Ulcerative colitis Sister   . Other Sister        abn ovaian growth, partial hysterectomy  . Uterine cancer Maternal Aunt 58  . Stroke Maternal Aunt 66  . Basal cell carcinoma Brother   . Skin cancer Paternal Uncle   . Skin cancer Maternal Grandmother   . Stroke Paternal Grandfather 49  . Hydrocephalus Cousin     Vitals:   06/14/18 1427  BP: 108/78  Pulse: 98  SpO2: 96%  Weight: 65.8 kg (145 lb)    PHYSICAL EXAM: General:  Well appearing. No resp difficulty HEENT: normal Neck: supple. no JVD. Carotids 2+ bilat; no bruits. No lymphadenopathy or thryomegaly appreciated. Cor: PMI nondisplaced. Regular rate & rhythm. No rubs, gallops or murmurs. Left port-a-cath site ok  Lungs: clear Abdomen: soft, nontender, nondistended. No hepatosplenomegaly. No bruits or masses. Good bowel sounds. Extremities: no cyanosis, clubbing, rash,  edema Neuro: alert & orientedx3, cranial nerves grossly intact. moves all 4 extremities w/o difficulty. Affect pleasant  ASSESSMENT & PLAN: 1.  Right breast cancer - triple positive. S/p lumpectomy & XRT - Echo images reviewed personally. All parameters stable. Reviewed signs and symptoms of HF to look for. Continue Herceptin. Follow-up with echo in 3 months.  Glori Bickers, MD  2:44 PM

## 2018-07-01 ENCOUNTER — Inpatient Hospital Stay: Payer: BC Managed Care – PPO

## 2018-07-01 ENCOUNTER — Inpatient Hospital Stay: Payer: BC Managed Care – PPO | Attending: Oncology

## 2018-07-01 ENCOUNTER — Encounter: Payer: Self-pay | Admitting: Adult Health

## 2018-07-01 ENCOUNTER — Inpatient Hospital Stay (HOSPITAL_BASED_OUTPATIENT_CLINIC_OR_DEPARTMENT_OTHER): Payer: BC Managed Care – PPO | Admitting: Adult Health

## 2018-07-01 VITALS — BP 116/83 | HR 68 | Temp 98.3°F | Resp 17 | Ht 66.0 in | Wt 144.0 lb

## 2018-07-01 DIAGNOSIS — Z170 Estrogen receptor positive status [ER+]: Secondary | ICD-10-CM | POA: Diagnosis not present

## 2018-07-01 DIAGNOSIS — Z79899 Other long term (current) drug therapy: Secondary | ICD-10-CM | POA: Insufficient documentation

## 2018-07-01 DIAGNOSIS — Z8042 Family history of malignant neoplasm of prostate: Secondary | ICD-10-CM | POA: Diagnosis not present

## 2018-07-01 DIAGNOSIS — R21 Rash and other nonspecific skin eruption: Secondary | ICD-10-CM | POA: Insufficient documentation

## 2018-07-01 DIAGNOSIS — C50211 Malignant neoplasm of upper-inner quadrant of right female breast: Secondary | ICD-10-CM

## 2018-07-01 DIAGNOSIS — R232 Flushing: Secondary | ICD-10-CM

## 2018-07-01 DIAGNOSIS — Z79811 Long term (current) use of aromatase inhibitors: Secondary | ICD-10-CM

## 2018-07-01 DIAGNOSIS — Z8041 Family history of malignant neoplasm of ovary: Secondary | ICD-10-CM | POA: Insufficient documentation

## 2018-07-01 DIAGNOSIS — E2839 Other primary ovarian failure: Secondary | ICD-10-CM

## 2018-07-01 DIAGNOSIS — Z95828 Presence of other vascular implants and grafts: Secondary | ICD-10-CM

## 2018-07-01 DIAGNOSIS — F21 Schizotypal disorder: Secondary | ICD-10-CM | POA: Diagnosis not present

## 2018-07-01 DIAGNOSIS — Z5112 Encounter for antineoplastic immunotherapy: Secondary | ICD-10-CM | POA: Diagnosis not present

## 2018-07-01 LAB — CMP (CANCER CENTER ONLY)
ALBUMIN: 4.1 g/dL (ref 3.5–5.0)
ALT: 21 U/L (ref 0–44)
AST: 22 U/L (ref 15–41)
Alkaline Phosphatase: 89 U/L (ref 38–126)
Anion gap: 8 (ref 5–15)
BUN: 15 mg/dL (ref 6–20)
CO2: 31 mmol/L (ref 22–32)
Calcium: 9.5 mg/dL (ref 8.9–10.3)
Chloride: 104 mmol/L (ref 98–111)
Creatinine: 0.72 mg/dL (ref 0.44–1.00)
GFR, Est AFR Am: >60 mL/min (ref >60)
Glucose, Bld: 92 mg/dL (ref 70–99)
Potassium: 4.1 mmol/L (ref 3.5–5.1)
Sodium: 143 mmol/L (ref 135–145)
Total Bilirubin: 0.8 mg/dL (ref 0.3–1.2)
Total Protein: 6.6 g/dL (ref 6.5–8.1)

## 2018-07-01 LAB — CBC WITH DIFFERENTIAL (CANCER CENTER ONLY)
Basophils Absolute: 0 10*3/uL (ref 0.0–0.1)
Basophils Relative: 1 %
EOS PCT: 2 %
Eosinophils Absolute: 0.1 10*3/uL (ref 0.0–0.5)
HCT: 36.4 % (ref 34.8–46.6)
HEMOGLOBIN: 12.5 g/dL (ref 11.6–15.9)
LYMPHS ABS: 0.7 10*3/uL — AB (ref 0.9–3.3)
LYMPHS PCT: 19 %
MCH: 33.1 pg (ref 25.1–34.0)
MCHC: 34.3 g/dL (ref 31.5–36.0)
MCV: 96.4 fL (ref 79.5–101.0)
Monocytes Absolute: 0.3 10*3/uL (ref 0.1–0.9)
Monocytes Relative: 8 %
Neutro Abs: 2.7 10*3/uL (ref 1.5–6.5)
Neutrophils Relative %: 70 %
Platelet Count: 214 10*3/uL (ref 145–400)
RBC: 3.78 MIL/uL (ref 3.70–5.45)
RDW: 13.5 % (ref 11.2–14.5)
WBC: 3.9 10*3/uL (ref 3.9–10.3)

## 2018-07-01 MED ORDER — ACETAMINOPHEN 325 MG PO TABS
ORAL_TABLET | ORAL | Status: AC
  Filled 2018-07-01: qty 2

## 2018-07-01 MED ORDER — SODIUM CHLORIDE 0.9 % IV SOLN
Freq: Once | INTRAVENOUS | Status: AC
  Administered 2018-07-01: 15:00:00 via INTRAVENOUS
  Filled 2018-07-01: qty 250

## 2018-07-01 MED ORDER — HEPARIN SOD (PORK) LOCK FLUSH 100 UNIT/ML IV SOLN
500.0000 [IU] | Freq: Once | INTRAVENOUS | Status: DC | PRN
  Filled 2018-07-01: qty 5

## 2018-07-01 MED ORDER — DIPHENHYDRAMINE HCL 25 MG PO CAPS
ORAL_CAPSULE | ORAL | Status: AC
  Filled 2018-07-01: qty 1

## 2018-07-01 MED ORDER — ACETAMINOPHEN 325 MG PO TABS
650.0000 mg | ORAL_TABLET | Freq: Once | ORAL | Status: AC
  Administered 2018-07-01: 650 mg via ORAL

## 2018-07-01 MED ORDER — TRASTUZUMAB CHEMO 150 MG IV SOLR
399.0000 mg | Freq: Once | INTRAVENOUS | Status: AC
  Administered 2018-07-01: 399 mg via INTRAVENOUS
  Filled 2018-07-01: qty 19

## 2018-07-01 MED ORDER — SODIUM CHLORIDE 0.9% FLUSH
10.0000 mL | Freq: Once | INTRAVENOUS | Status: AC
  Administered 2018-07-01: 10 mL
  Filled 2018-07-01: qty 10

## 2018-07-01 MED ORDER — DIPHENHYDRAMINE HCL 25 MG PO CAPS
25.0000 mg | ORAL_CAPSULE | Freq: Once | ORAL | Status: AC
  Administered 2018-07-01: 25 mg via ORAL

## 2018-07-01 MED ORDER — SODIUM CHLORIDE 0.9% FLUSH
10.0000 mL | INTRAVENOUS | Status: DC | PRN
  Filled 2018-07-01: qty 10

## 2018-07-01 NOTE — Patient Instructions (Signed)
[facility] Discharge Instructions for Patients Receiving Chemotherapy  Today you received the following chemotherapy agents: Trastuzumab (Herceptin)  To help prevent nausea and vomiting after your treatment, we encourage you to take your nausea medication as directed.    If you develop nausea and vomiting that is not controlled by your nausea medication, call the clinic.   BELOW ARE SYMPTOMS THAT SHOULD BE REPORTED IMMEDIATELY:  *FEVER GREATER THAN 100.5 F  *CHILLS WITH OR WITHOUT FEVER  NAUSEA AND VOMITING THAT IS NOT CONTROLLED WITH YOUR NAUSEA MEDICATION  *UNUSUAL SHORTNESS OF BREATH  *UNUSUAL BRUISING OR BLEEDING  TENDERNESS IN MOUTH AND THROAT WITH OR WITHOUT PRESENCE OF ULCERS  *URINARY PROBLEMS  *BOWEL PROBLEMS  UNUSUAL RASH Items with * indicate a potential emergency and should be followed up as soon as possible.  Feel free to call the clinic should you have any questions or concerns. The clinic phone number is [phone].  Please show the [facility] [place] at check-in to the Emergency Department and triage nurse.

## 2018-07-01 NOTE — Progress Notes (Signed)
[place]  [phone] [phone]     ID: Hannah Woodard DOB: Sep 19, 1971  MR#: [id number]  YTK#:[id number]  Patient Care Team: Unk Pinto, MD as PCP - General (Internal Medicine) Jovita Kussmaul, MD as Consulting Physician (General Surgery) Magrinat, Virgie Dad, MD as Consulting Physician (Oncology) Kyung Rudd, MD as Consulting Physician (Radiation Oncology) Lorelle Gibbs, MD (Radiology) Howard-McNatt, Mable Fill, MD as Referring Physician (Surgery) OTHER MD:  CHIEF COMPLAINT: Triple positive breast cancer  CURRENT TREATMENT: Trastuzumab, goserelin, anastrozole  INTERVAL HISTORY: Hannah Woodard returns today for follow-up and treatment of her triple positive breast cancer. The patient continues on trastuzumab. She also receives goserelin, every 28 days.   She notes when she received her injection the last week she felt the nurse administered it incorrectly.  She would like to receive it in flush instead of infusion, particularly when [employer] is adminstering it.  REVIEW OF SYSTEMS: Hannah Woodard also notes a rash in between her breasts.  She is wearing a sports bra and is using OTC anti fungal cream alternating with Ketoconazole cream.  She notes that she is planning on taking a break from anti estrogens.  She plans on doing this in 08/2018.  She will continue on anastrozole during this time and 6 months later have [place] and estradiol drawn.  She is taking gabapentin at night to assist with hot flashes.    Hannah Woodard underwent breast MRI recently.  Results are below.   Hannah Woodard is doing well otherwise and a detailed ROS was otherwise non contributory.     HISTORY OF CURRENT ILLNESS: From the original intake note:  The patient has a history of cysts and nodules in the right breast which have been closely followed, with exams 07/30/2016 and 01/28/2017.  On 07/29/2017 follow-up right breast ultrasonography showed no findings of concern.  Bilateral  diagnostic mammography 08/18/2017 with right breast ultrasonography on 08/18/2017 at [facility] found the breast density to be category C.  There was now a new irregular high density mass in the posterior portion of the right breast seen on the mediolateral oblique view only.  Ultrasonography confirmed a 1.4 cm lobulated mass in the upper inner quadrant of the right breast 10 cm from the nipple associated with pectoral muscle invasion.  The right axilla was sonographically benign.  Biopsy of this mass 08/24/2017 showed ([id number]) invasive ductal carcinoma, grade 2, estrogen receptor 95% positive, progesterone receptor 100% positive, both with strong staining intensity, with an MIB-1 of 60%, and HER-2 amplified, with a signals ratio of 2.84 (full report pending).  The patient's subsequent history is as detailed below.  PAST MEDICAL HISTORY: Past Medical History:  Diagnosis Date  . Breast cancer ([place])   . Family history of ovarian cancer 09/02/2017  . Family history of prostate cancer in father 09/02/2017  . Family history of uterine cancer 09/02/2017  . Plantar fasciitis     PAST SURGICAL HISTORY: Past Surgical History:  Procedure Laterality Date  . right lumpectomy, sentinel node biopsy, and bilateral mammoplasty  10/2017   [facility]    FAMILY HISTORY Family History  Problem Relation Age of Onset  . Ovarian cancer Mother 78  . Prostate cancer Father 37       'high gleason' unsure number  . Ulcerative colitis Sister   . Other Sister        abn ovaian growth, partial hysterectomy  . Uterine cancer Maternal Aunt 32  . Stroke Maternal Aunt 66  . Basal cell carcinoma Brother   .  Skin cancer Paternal Uncle   . Skin cancer Maternal Grandmother   . Stroke Paternal Grandfather 25  . Hydrocephalus Cousin   The patient's father died from alcoholic cirrhosis at age 49.  He had been diagnosed with prostate cancer at age 51.  The patient's mother was diagnosed with ovarian cancer at age 18 and died  a year later.  The patient has 1 brother, 1 sister.  The patient's brother has had basal cell and other skin cancers.  A paternal aunt had uterine cancer.  Other relatives have had skin cancers but the patient does not know if these were melanomas or not  GYNECOLOGIC HISTORY:  No LMP recorded. (Menstrual status: Chemotherapy).  Menarche age 28, the patient has never been pregnant.  She is still having regular periods.  She used oral contraceptives briefly in the past without complications. She and her husband are very interested in having children, and currently (December 2018) they are not using any contraception.   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  Her husband Rodman Key (goes by [employer]") is a Dealer.  At home it's just them, a [employer] and 2 cats.    ADVANCED DIRECTIVES: Not in place   HEALTH MAINTENANCE: Social History   Tobacco Use  . Smoking status: Never Smoker  . Smokeless tobacco: Never Used  Substance Use Topics  . Alcohol use: Yes  . Drug use: No     Colonoscopy: Never  PAP:  Bone density: Never   Allergies  Allergen Reactions  . Adhesive [Tape]   . Milk-Related Compounds Diarrhea    Stomach cramping  . Poison Ivy Extract [Poison Ivy Extract] Hives, Itching and Rash    Current Outpatient Medications  Medication Sig Dispense Refill  . anastrozole (ARIMIDEX) 1 MG tablet Take 1 tablet (1 mg total) by mouth daily. 30 tablet 6  . Ascorbic Acid (VITAMIN C PO) Take 400 Units by mouth 2 (two) times daily.    Marland Kitchen b complex vitamins tablet Take 1 tablet by mouth daily.    . Cholecalciferol (VITAMIN D3) 5000 units TABS Take 10,000 Units by mouth daily.    . fish oil-omega-3 fatty acids 1000 MG capsule Take 1 g by mouth daily.    Marland Kitchen gabapentin (NEURONTIN) 300 MG capsule Take 1 capsule (300 mg total) by mouth at bedtime. 90 capsule 4  . lidocaine-prilocaine (EMLA) cream Apply 1 application  topically as needed. 30 g 0  . OVER THE COUNTER MEDICATION Take 1 tablet by mouth at bedtime as needed (constipation).     . TURMERIC PO Take 1 capsule by mouth as directed.     Marland Kitchen UNABLE TO FIND Take 500 mg by mouth daily. [place] mushroom    . UNABLE TO FIND Take 500 mg by mouth daily. [place] Mushroom    . UNABLE TO FIND Take 5 mg by mouth as directed. CBD Oil; patient takes for nausea      No current facility-administered medications for this visit.    Facility-Administered Medications Ordered in Other Visits  Medication Dose Route Frequency Provider Last Rate Last Dose  . heparin lock flush 100 unit/mL  500 Units Intracatheter Once PRN Magrinat, Virgie Dad, MD      . sodium chloride flush (NS) 0.9 % injection 10 mL  10 mL Intracatheter PRN Magrinat, Virgie Dad, MD        OBJECTIVE:  Vitals:   07/01/18 1326  BP: 116/83  Pulse:  68  Resp: 17  Temp: 98.3 F (36.8 C)  SpO2: 100%     Body mass index is 23.24 kg/m.   Wt Readings from Last 3 Encounters:  07/01/18 144 lb (65.3 kg)  06/14/18 145 lb (65.8 kg)  06/10/18 138 lb (62.6 kg)   ECOG FS:1 - Symptomatic but completely ambulatory GENERAL: Patient is a well appearing female in no acute distress HEENT:  Sclerae anicteric.  Oropharynx clear and moist. No ulcerations or evidence of oropharyngeal candidiasis. Neck is supple.  NODES:  No cervical, supraclavicular, or axillary lymphadenopathy palpated.  BREAST EXAM:  S/p bilateral reductions.  Seroma noted in left upper inner breast.  No sign of recurrence noted LUNGS:  Clear to auscultation bilaterally.  No wheezes or rhonchi. HEART:  Regular rate and rhythm. No murmur appreciated. ABDOMEN:  Soft, nontender.  Positive, normoactive bowel sounds. No organomegaly palpated. MSK:  No focal spinal tenderness to palpation. Full range of motion bilaterally in the upper extremities. EXTREMITIES:  No peripheral edema.   SKIN:  Clear with no obvious rashes or skin changes. No nail  dyscrasia. NEURO:  Nonfocal. Well oriented.  Appropriate affect.   LAB RESULTS:  CMP     Component Value Date/Time   NA 143 07/01/2018 1250   NA 138 09/01/2017 1238   K 4.1 07/01/2018 1250   K 4.4 09/01/2017 1238   CL 104 07/01/2018 1250   CO2 31 07/01/2018 1250   CO2 25 09/01/2017 1238   GLUCOSE 92 07/01/2018 1250   GLUCOSE 83 09/01/2017 1238   BUN 15 07/01/2018 1250   BUN 9.2 09/01/2017 1238   CREATININE 0.72 07/01/2018 1250   CREATININE 0.7 09/01/2017 1238   CALCIUM 9.5 07/01/2018 1250   CALCIUM 9.4 09/01/2017 1238   PROT 6.6 07/01/2018 1250   PROT 7.4 09/01/2017 1238   ALBUMIN 4.1 07/01/2018 1250   ALBUMIN 4.2 09/01/2017 1238   AST 22 07/01/2018 1250   AST 21 09/01/2017 1238   ALT 21 07/01/2018 1250   ALT 21 09/01/2017 1238   ALKPHOS 89 07/01/2018 1250   ALKPHOS 60 09/01/2017 1238   BILITOT 0.8 07/01/2018 1250   BILITOT 0.65 09/01/2017 1238   GFRNONAA >60 07/01/2018 1250   GFRNONAA 111 05/18/2017 0928   GFRAA >60 07/01/2018 1250   GFRAA 128 05/18/2017 0928    No results found for: TOTALPROTELP, ALBUMINELP, A1GS, A2GS, BETS, BETA2SER, GAMS, MSPIKE, SPEI  No results found for: Nils Pyle, [facility]  Lab Results  Component Value Date   WBC 3.9 07/01/2018   NEUTROABS 2.7 07/01/2018   HGB 12.5 07/01/2018   HCT 36.4 07/01/2018   MCV 96.4 07/01/2018   PLT 214 07/01/2018      Chemistry      Component Value Date/Time   NA 143 07/01/2018 1250   NA 138 09/01/2017 1238   K 4.1 07/01/2018 1250   K 4.4 09/01/2017 1238   CL 104 07/01/2018 1250   CO2 31 07/01/2018 1250   CO2 25 09/01/2017 1238   BUN 15 07/01/2018 1250   BUN 9.2 09/01/2017 1238   CREATININE 0.72 07/01/2018 1250   CREATININE 0.7 09/01/2017 1238      Component Value Date/Time   CALCIUM 9.5 07/01/2018 1250   CALCIUM 9.4 09/01/2017 1238   ALKPHOS 89 07/01/2018 1250   ALKPHOS 60 09/01/2017 1238   AST 22 07/01/2018 1250   AST 21 09/01/2017 1238   ALT 21 07/01/2018 1250   ALT  21 09/01/2017 1238   BILITOT 0.8 07/01/2018 1250  BILITOT 0.65 09/01/2017 1238       No results found for: LABCA2  No components found for: [id number]  No results for input(s): INR in the last 168 hours.  No results found for: LABCA2  No results found for: YQI347  No results found for: QQV956  No results found for: LOV564  No results found for: CA2729  No components found for: HGQUANT  No results found for: CEA1 / No results found for: CEA1   No results found for: AFPTUMOR  No results found for: CHROMOGRNA  No results found for: PSA1  Appointment on 07/01/2018  Component Date Value Ref Range Status  . Sodium 07/01/2018 143  135 - 145 mmol/L Final  . Potassium 07/01/2018 4.1  3.5 - 5.1 mmol/L Final  . Chloride 07/01/2018 104  98 - 111 mmol/L Final  . CO2 07/01/2018 31  22 - 32 mmol/L Final  . Glucose, Bld 07/01/2018 92  70 - 99 mg/dL Final  . BUN 07/01/2018 15  6 - 20 mg/dL Final  . Creatinine 07/01/2018 0.72  0.44 - 1.00 mg/dL Final  . Calcium 07/01/2018 9.5  8.9 - 10.3 mg/dL Final  . Total Protein 07/01/2018 6.6  6.5 - 8.1 g/dL Final  . Albumin 07/01/2018 4.1  3.5 - 5.0 g/dL Final  . AST 07/01/2018 22  15 - 41 U/L Final  . ALT 07/01/2018 21  0 - 44 U/L Final  . Alkaline Phosphatase 07/01/2018 89  38 - 126 U/L Final  . Total Bilirubin 07/01/2018 0.8  0.3 - 1.2 mg/dL Final  . GFR, Est Non Af Am 07/01/2018 >60  >60 mL/min Final  . GFR, Est AFR Am 07/01/2018 >60  >60 mL/min Final   Comment: (NOTE) The eGFR has been calculated using the CKD EPI equation. This calculation has not been validated in all clinical situations. eGFR's persistently <60 mL/min signify possible Chronic Kidney Disease.   Georgiann Hahn gap 07/01/2018 8  5 - 15 Final   Performed at [facility], [place] [address], [place], [place] [address]  . WBC Count 07/01/2018 3.9  3.9 - 10.3 K/uL Final  . RBC 07/01/2018 3.78  3.70 - 5.45 MIL/uL Final  . Hemoglobin 07/01/2018 12.5  11.6  - 15.9 g/dL Final  . HCT 07/01/2018 36.4  34.8 - 46.6 % Final  . MCV 07/01/2018 96.4  79.5 - 101.0 fL Final  . MCH 07/01/2018 33.1  25.1 - 34.0 pg Final  . MCHC 07/01/2018 34.3  31.5 - 36.0 g/dL Final  . RDW 07/01/2018 13.5  11.2 - 14.5 % Final  . Platelet Count 07/01/2018 214  145 - 400 K/uL Final  . Neutrophils Relative % 07/01/2018 70  % Final  . Neutro Abs 07/01/2018 2.7  1.5 - 6.5 K/uL Final  . Lymphocytes Relative 07/01/2018 19  % Final  . Lymphs Abs 07/01/2018 0.7* 0.9 - 3.3 K/uL Final  . Monocytes Relative 07/01/2018 8  % Final  . Monocytes Absolute 07/01/2018 0.3  0.1 - 0.9 K/uL Final  . Eosinophils Relative 07/01/2018 2  % Final  . Eosinophils Absolute 07/01/2018 0.1  0.0 - 0.5 K/uL Final  . Basophils Relative 07/01/2018 1  % Final  . Basophils Absolute 07/01/2018 0.0  0.0 - 0.1 K/uL Final   Performed at [facility], [place] [address] [place], [place] [address]    (this displays the last labs from the last 3 days)  No results found for: TOTALPROTELP, ALBUMINELP, A1GS, A2GS, BETS,  BETA2SER, GAMS, MSPIKE, SPEI (this displays SPEP labs)  No results found for: KPAFRELGTCHN, LAMBDASER, KAPLAMBRATIO (kappa/lambda light chains)  No results found for: HGBA, HGBA2QUANT, HGBFQUANT, HGBSQUAN (Hemoglobinopathy evaluation)   No results found for: LDH  Lab Results  Component Value Date   IRON 132 12/21/2017   TIBC 305 12/21/2017   IRONPCTSAT 43 12/21/2017   (Iron and TIBC)  Lab Results  Component Value Date   FERRITIN 67 12/16/2016    Urinalysis    Component Value Date/Time   COLORURINE AMBER (A) 12/27/2017 1905   APPEARANCEUR HAZY (A) 12/27/2017 1905   LABSPEC 1.005 12/27/2017 1905   PHURINE 7.0 12/27/2017 1905   GLUCOSEU NEGATIVE 12/27/2017 1905   HGBUR NEGATIVE 12/27/2017 1905   BILIRUBINUR NEGATIVE 12/27/2017 1905   KETONESUR NEGATIVE 12/27/2017 1905   PROTEINUR NEGATIVE 12/27/2017 1905   UROBILINOGEN 0.2 12/06/2014 1529   NITRITE  NEGATIVE 12/27/2017 1905   LEUKOCYTESUR TRACE (A) 12/27/2017 1905    STUDIES:    ELIGIBLE FOR AVAILABLE RESEARCH PROTOCOL: No  ASSESSMENT: 47 y.o. [place] woman status post right breast upper inner quadrant biopsy 08/24/2017 for a clinical T1c N0 invasive ductal carcinoma, triple positive, with an MIB-1 of 60%  (1) genetics testing 09/10/2017 through the Common Hereditary Cancer Panel + Melanoma Panel found no deleterious mutations in: APC, ATM, AXIN2, BAP1, BARD1, BMPR1A, BRCA1, BRCA2, BRIP1, CDH1, CDK4, CDKN2A (p14ARF), CDKN2A (p16INK4a), CHEK2, CTNNA1, DICER1, EPCAM*, GREM1*, KIT, MEN1, MLH1, MSH2, MSH3, MSH6, MUTYH, NBN, NF1, PALB2, PDGFRA, PMS2, POLD1, POLE, POT1, PTEN, RAD50, RAD51C, RAD51D, RB1, SDHB, SDHC, SDHD, SMAD4, SMARCA4, STK11, TP53, TSC1, TSC2, VHL. 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  (a) baseline echocardiogram 09/10/2017 shows an ejection fraction of 55-60%  (b) echocardiogram 12/09/2017 showed an ejection fraction in the 60-65% range  (c) echo in 05/2018 shows EF of 60-65%  (5) adjuvant radiation to follow   (6) anastrozole started 03/28/2018  (7) goserelin started 09/29/2017  PLAN: Hannah Woodard is doing well today.  She has no sign of recurrence and continues on Anastrozole and Goserelin without difficulty.  I have reported the injection administration incident to nursing administration.  She will  receive the Trastuzumab today.  Her echo in 05/2018 was normal.  She is doing well on treatment.    I ordered a bone density today and that will be scheduled.  We reviewed her upcoming schedule, and I will add on some Herceptin appointments for her.  I will also request that she have injections that fall on the same day as treatment in the flush room from now on.    She was recommended to continue the ketoconazole cream for her rash in between her breasts.    Hannah Woodard will return 3 weeks for labs and her next treatment.  She will see Dr. Jana Hakim in December.  She knows to call for any questions or concerns between now and then.    A total of (30) minutes of face-to-face time was spent with this patient with greater than 50%  of that time in counseling and care-coordination.    Wilber Bihari, NP  07/01/18 4:36 PM Medical Oncology and Hematology [facility] [address] [place], [place] [address] Tel. [phone]    Fax. [phone]

## 2018-07-04 ENCOUNTER — Telehealth: Payer: Self-pay | Admitting: Adult Health

## 2018-07-04 NOTE — Telephone Encounter (Signed)
Per 10/4 los, no orders.  [facility] will call patient regarding bone density.

## 2018-07-08 ENCOUNTER — Ambulatory Visit: Payer: BC Managed Care – PPO

## 2018-07-08 ENCOUNTER — Inpatient Hospital Stay: Payer: BC Managed Care – PPO

## 2018-07-08 VITALS — BP 115/80 | HR 72 | Temp 98.1°F | Resp 18

## 2018-07-08 DIAGNOSIS — Z170 Estrogen receptor positive status [ER+]: Secondary | ICD-10-CM

## 2018-07-08 DIAGNOSIS — Z95828 Presence of other vascular implants and grafts: Secondary | ICD-10-CM

## 2018-07-08 DIAGNOSIS — C50211 Malignant neoplasm of upper-inner quadrant of right female breast: Secondary | ICD-10-CM

## 2018-07-08 MED ORDER — GOSERELIN ACETATE 3.6 MG ~~LOC~~ IMPL
DRUG_IMPLANT | SUBCUTANEOUS | Status: AC
  Filled 2018-07-08: qty 3.6

## 2018-07-08 MED ORDER — GOSERELIN ACETATE 3.6 MG ~~LOC~~ IMPL
3.6000 mg | DRUG_IMPLANT | Freq: Once | SUBCUTANEOUS | Status: AC
  Administered 2018-07-08: 3.6 mg via SUBCUTANEOUS

## 2018-07-22 ENCOUNTER — Inpatient Hospital Stay: Payer: BC Managed Care – PPO

## 2018-07-22 DIAGNOSIS — C50211 Malignant neoplasm of upper-inner quadrant of right female breast: Secondary | ICD-10-CM

## 2018-07-22 DIAGNOSIS — Z95828 Presence of other vascular implants and grafts: Secondary | ICD-10-CM

## 2018-07-22 DIAGNOSIS — Z170 Estrogen receptor positive status [ER+]: Principal | ICD-10-CM

## 2018-07-22 LAB — CMP (CANCER CENTER ONLY)
ALK PHOS: 87 U/L (ref 38–126)
ALT: 21 U/L (ref 0–44)
ANION GAP: 10 (ref 5–15)
AST: 22 U/L (ref 15–41)
Albumin: 3.9 g/dL (ref 3.5–5.0)
BUN: 14 mg/dL (ref 6–20)
CALCIUM: 9.2 mg/dL (ref 8.9–10.3)
CO2: 29 mmol/L (ref 22–32)
Chloride: 104 mmol/L (ref 98–111)
Creatinine: 0.66 mg/dL (ref 0.44–1.00)
Glucose, Bld: 97 mg/dL (ref 70–99)
Potassium: 3.9 mmol/L (ref 3.5–5.1)
SODIUM: 143 mmol/L (ref 135–145)
TOTAL PROTEIN: 6.6 g/dL (ref 6.5–8.1)
Total Bilirubin: 0.7 mg/dL (ref 0.3–1.2)

## 2018-07-22 LAB — CBC WITH DIFFERENTIAL (CANCER CENTER ONLY)
Abs Immature Granulocytes: 0.01 10*3/uL (ref 0.00–0.07)
BASOS ABS: 0 10*3/uL (ref 0.0–0.1)
BASOS PCT: 1 %
EOS ABS: 0.1 10*3/uL (ref 0.0–0.5)
Eosinophils Relative: 2 %
HCT: 35.8 % — ABNORMAL LOW (ref 36.0–46.0)
Hemoglobin: 12.1 g/dL (ref 12.0–15.0)
Immature Granulocytes: 0 %
LYMPHS ABS: 0.8 10*3/uL (ref 0.7–4.0)
Lymphocytes Relative: 20 %
MCH: 33.2 pg (ref 26.0–34.0)
MCHC: 33.8 g/dL (ref 30.0–36.0)
MCV: 98.1 fL (ref 80.0–100.0)
Monocytes Absolute: 0.4 10*3/uL (ref 0.1–1.0)
Monocytes Relative: 9 %
NEUTROS PCT: 68 %
NRBC: 0 % (ref 0.0–0.2)
Neutro Abs: 3 10*3/uL (ref 1.7–7.7)
PLATELETS: 199 10*3/uL (ref 150–400)
RBC: 3.65 MIL/uL — AB (ref 3.87–5.11)
RDW: 13.1 % (ref 11.5–15.5)
WBC: 4.3 10*3/uL (ref 4.0–10.5)

## 2018-07-22 MED ORDER — HEPARIN SOD (PORK) LOCK FLUSH 100 UNIT/ML IV SOLN
500.0000 [IU] | Freq: Once | INTRAVENOUS | Status: AC | PRN
  Administered 2018-07-22: 500 [IU]
  Filled 2018-07-22: qty 5

## 2018-07-22 MED ORDER — ACETAMINOPHEN 325 MG PO TABS
ORAL_TABLET | ORAL | Status: AC
  Filled 2018-07-22: qty 2

## 2018-07-22 MED ORDER — SODIUM CHLORIDE 0.9 % IV SOLN
Freq: Once | INTRAVENOUS | Status: AC
  Administered 2018-07-22: 14:00:00 via INTRAVENOUS
  Filled 2018-07-22: qty 250

## 2018-07-22 MED ORDER — DIPHENHYDRAMINE HCL 25 MG PO CAPS
ORAL_CAPSULE | ORAL | Status: AC
  Filled 2018-07-22: qty 1

## 2018-07-22 MED ORDER — TRASTUZUMAB CHEMO 150 MG IV SOLR
399.0000 mg | Freq: Once | INTRAVENOUS | Status: AC
  Administered 2018-07-22: 399 mg via INTRAVENOUS
  Filled 2018-07-22: qty 19

## 2018-07-22 MED ORDER — DIPHENHYDRAMINE HCL 25 MG PO CAPS
25.0000 mg | ORAL_CAPSULE | Freq: Once | ORAL | Status: AC
  Administered 2018-07-22: 25 mg via ORAL

## 2018-07-22 MED ORDER — SODIUM CHLORIDE 0.9% FLUSH
10.0000 mL | INTRAVENOUS | Status: DC | PRN
  Administered 2018-07-22: 10 mL
  Filled 2018-07-22: qty 10

## 2018-07-22 MED ORDER — SODIUM CHLORIDE 0.9% FLUSH
10.0000 mL | Freq: Once | INTRAVENOUS | Status: AC
  Administered 2018-07-22: 10 mL
  Filled 2018-07-22: qty 10

## 2018-07-22 MED ORDER — ACETAMINOPHEN 325 MG PO TABS
650.0000 mg | ORAL_TABLET | Freq: Once | ORAL | Status: AC
  Administered 2018-07-22: 650 mg via ORAL

## 2018-07-22 NOTE — Patient Instructions (Signed)
[place] Discharge Instructions for Patients Receiving Chemotherapy  Today you received the following chemotherapy agents: Trastuzumab (Herceptin)  To help prevent nausea and vomiting after your treatment, we encourage you to take your nausea medication as directed.    If you develop nausea and vomiting that is not controlled by your nausea medication, call the clinic.   BELOW ARE SYMPTOMS THAT SHOULD BE REPORTED IMMEDIATELY:  *FEVER GREATER THAN 100.5 F  *CHILLS WITH OR WITHOUT FEVER  NAUSEA AND VOMITING THAT IS NOT CONTROLLED WITH YOUR NAUSEA MEDICATION  *UNUSUAL SHORTNESS OF BREATH  *UNUSUAL BRUISING OR BLEEDING  TENDERNESS IN MOUTH AND THROAT WITH OR WITHOUT PRESENCE OF ULCERS  *URINARY PROBLEMS  *BOWEL PROBLEMS  UNUSUAL RASH Items with * indicate a potential emergency and should be followed up as soon as possible.  Feel free to call the clinic should you have any questions or concerns. The clinic phone number is [phone].  Please show the [place] at check-in to the Emergency Department and triage nurse.

## 2018-08-04 IMAGING — CT CT RENAL STONE PROTOCOL
2 of 3 series · 16 of 46 positions shown, 18 images · non-contrast
Comparison: None.

CLINICAL DATA: Acute onset of nausea and difficulty urinating.
Lower abdominal pain. Initial encounter.

EXAM:
CT ABDOMEN AND PELVIS WITHOUT CONTRAST
TECHNIQUE: Multidetector CT imaging of the abdomen and pelvis was performed
following the standard protocol without IV contrast.

[Series 3: lung · axial · 0.68mm/px · z∈[+1442,+1502]mm · 13 of 36 slices shown, 15 images]
[im 3/36  soft-tissue]
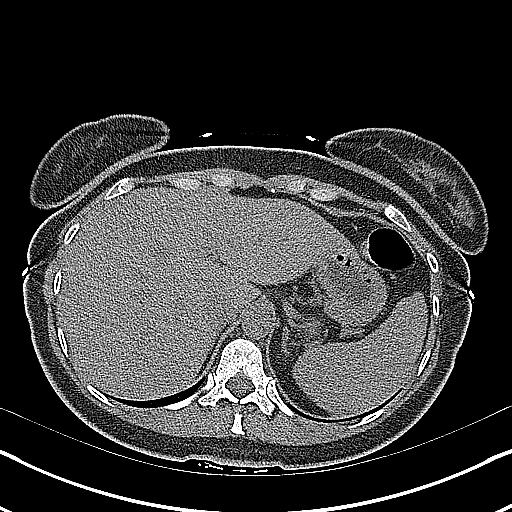
[im 3/36  bone]
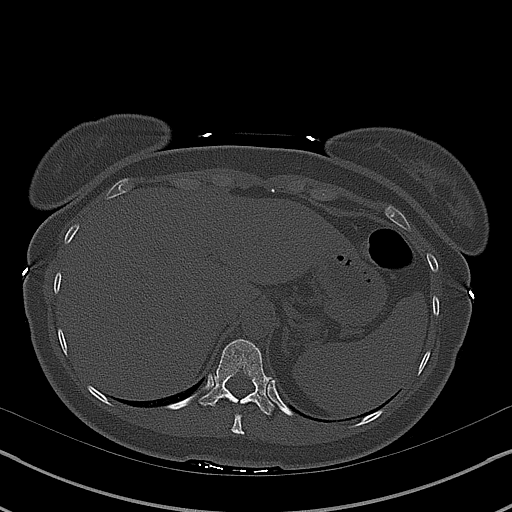
[im 5/36  soft-tissue]
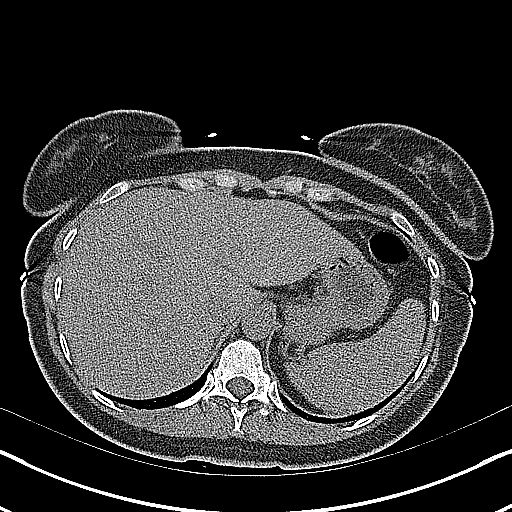
[im 7/36  soft-tissue]
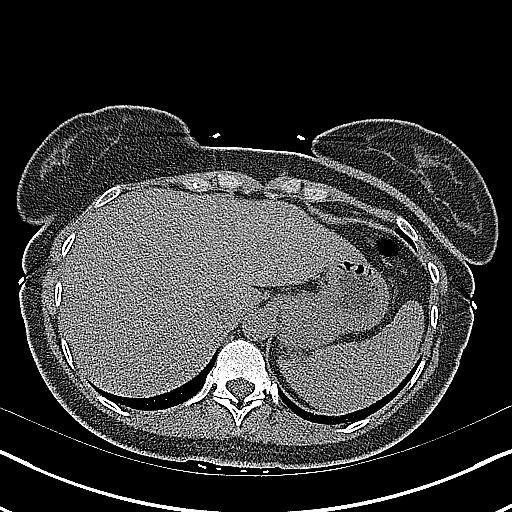
[im 11/36  soft-tissue]
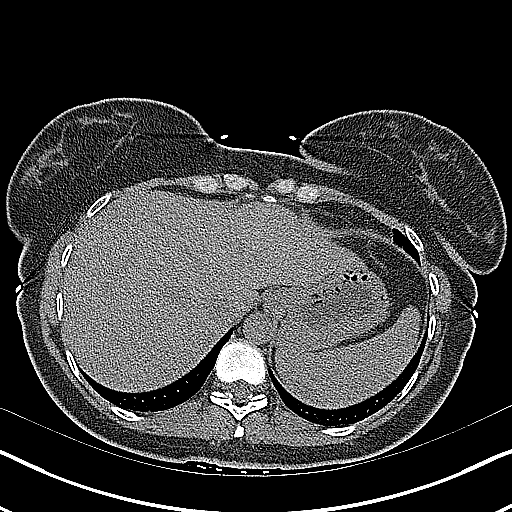
[im 13/36  soft-tissue]
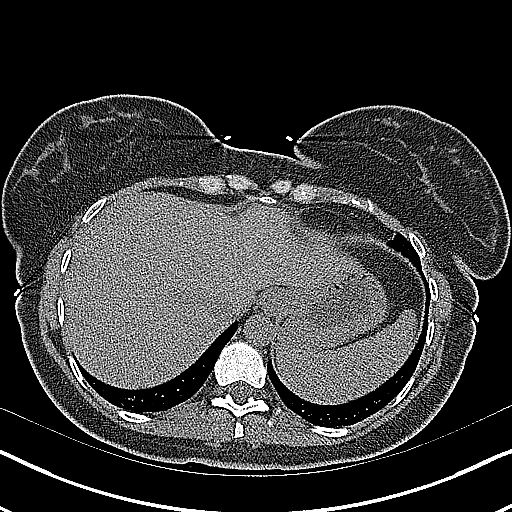
[im 15/36  soft-tissue]
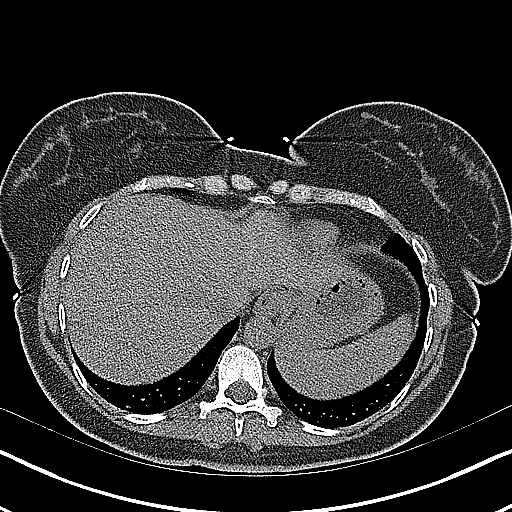
[im 19/36  soft-tissue]
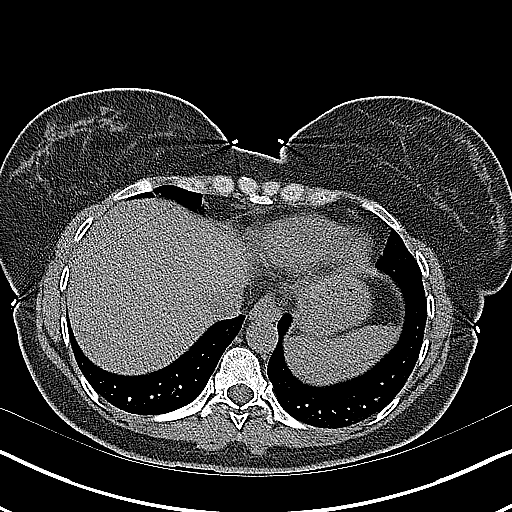
[im 21/36  soft-tissue]
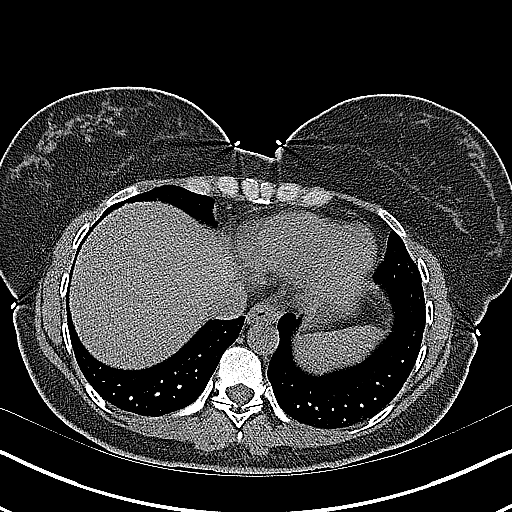
[im 23/36  soft-tissue]
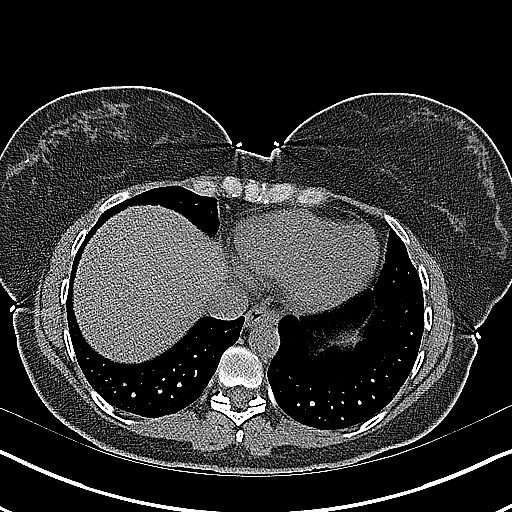
[im 23/36  bone]
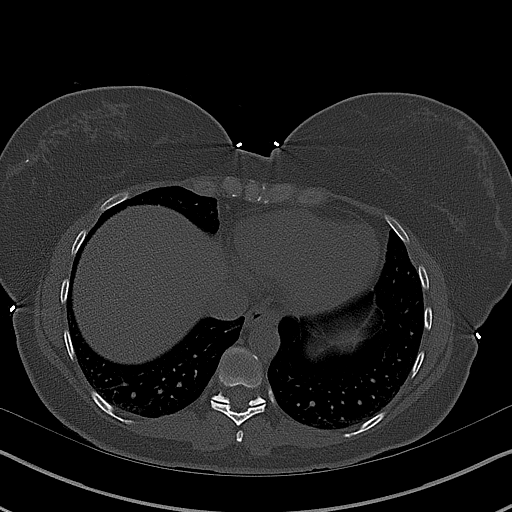
[im 25/36  soft-tissue]
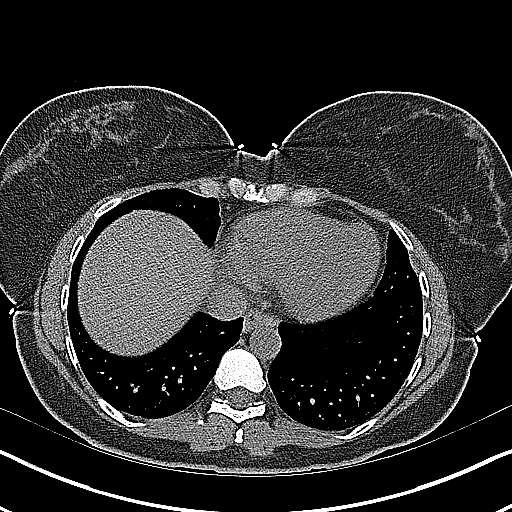
[im 29/36  soft-tissue]
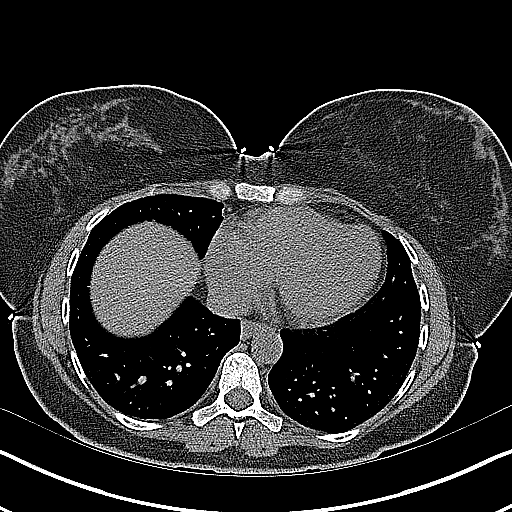
[im 31/36  soft-tissue]
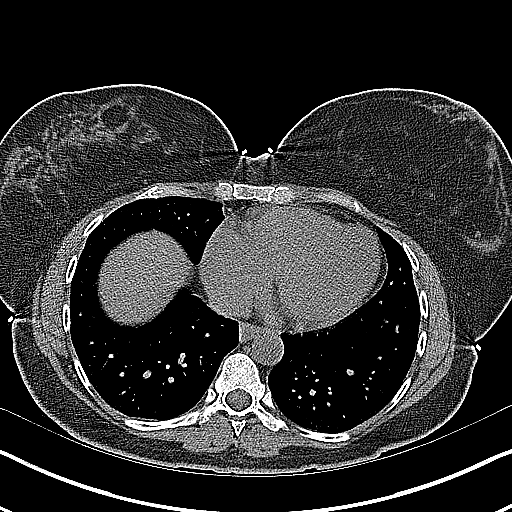
[im 33/36  soft-tissue]
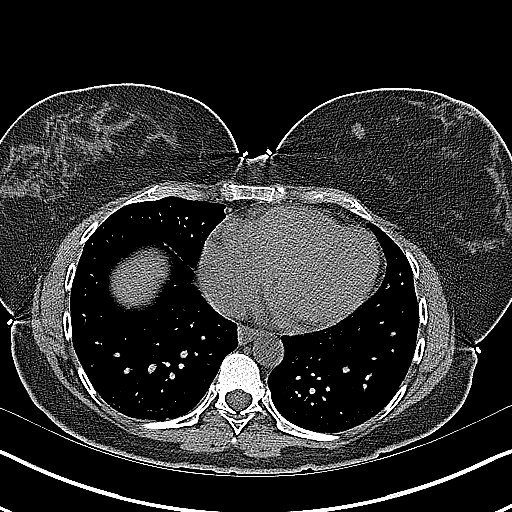

[Series 4: coronal · coronal · 0.67mm/px · 3 of 103 slices shown]
[im 35/103  soft-tissue]
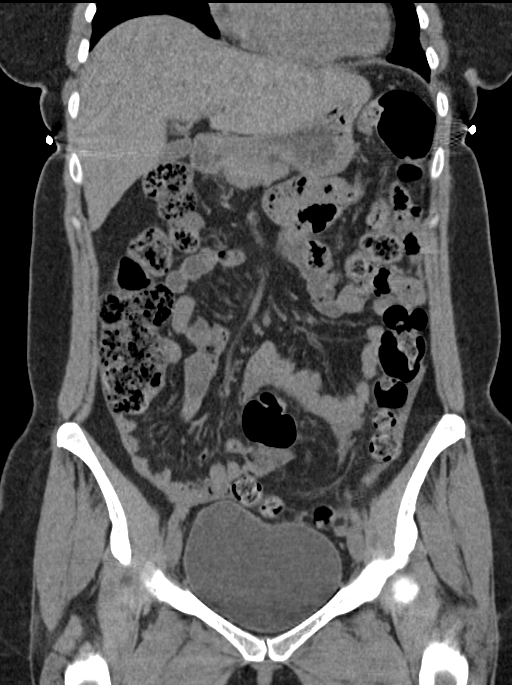
[im 46/103  soft-tissue]
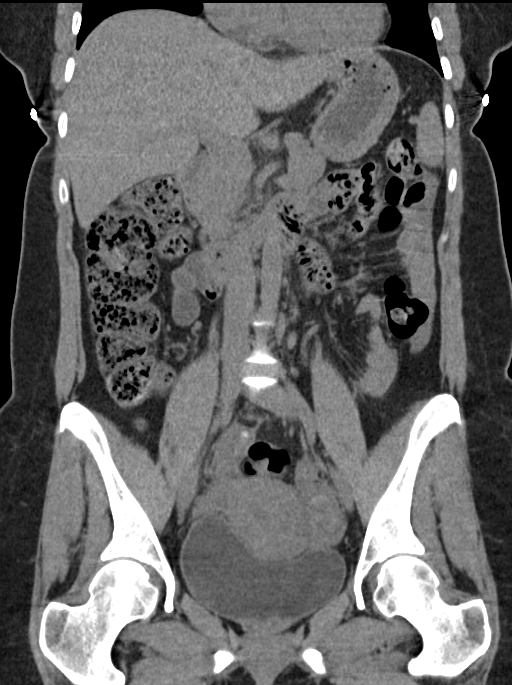
[im 57/103  soft-tissue]
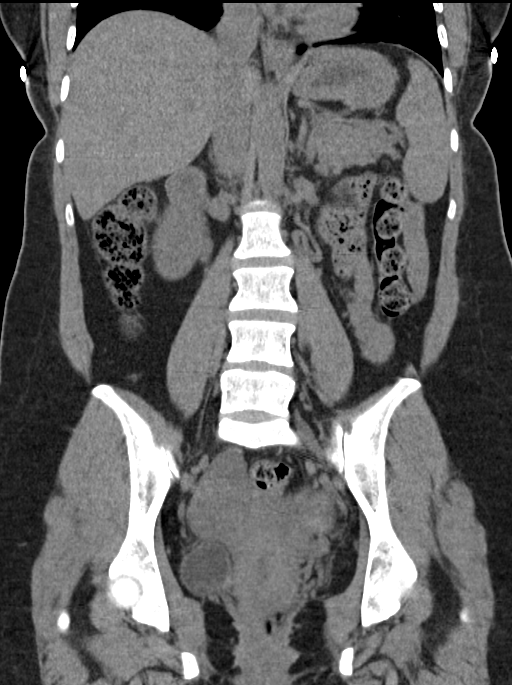

[16 of 46 positions shown; findings below may reference images not displayed]

FINDINGS: Lower chest: The visualized lung bases are grossly clear. The
visualized portions of the mediastinum are unremarkable.

Hepatobiliary: The liver is unremarkable in appearance. The
gallbladder is unremarkable in appearance. The common bile duct
remains normal in caliber.

Pancreas: The pancreas is within normal limits.

Spleen: The spleen is unremarkable in appearance.

Adrenals/Urinary Tract: The adrenal glands are unremarkable in
appearance. The kidneys are within normal limits. There is no
evidence of hydronephrosis. No renal or ureteral stones are
identified. No perinephric stranding is seen.

Stomach/Bowel: The stomach is unremarkable in appearance. The small
bowel is within normal limits. The appendix is normal in caliber,
without evidence of appendicitis. The colon is unremarkable in
appearance.

Vascular/Lymphatic: The abdominal aorta is unremarkable in
appearance. The inferior vena cava is grossly unremarkable. No
retroperitoneal lymphadenopathy is seen. No pelvic sidewall
lymphadenopathy is identified.

Reproductive: The bladder is mildly distended and within normal
limits. The uterus is grossly unremarkable in appearance. The
ovaries are relatively symmetric. No suspicious adnexal masses are
seen.

Other: No additional soft tissue abnormalities are seen.

Musculoskeletal: No acute osseous abnormalities are identified. The
visualized musculature is unremarkable in appearance.
IMPRESSION: Unremarkable noncontrast CT of the abdomen and pelvis.

## 2018-08-05 ENCOUNTER — Inpatient Hospital Stay: Payer: BC Managed Care – PPO | Attending: Oncology

## 2018-08-05 ENCOUNTER — Telehealth: Payer: Self-pay | Admitting: Oncology

## 2018-08-05 ENCOUNTER — Ambulatory Visit: Payer: BC Managed Care – PPO

## 2018-08-05 VITALS — BP 128/95 | HR 71 | Temp 98.1°F | Resp 18

## 2018-08-05 DIAGNOSIS — Z5112 Encounter for antineoplastic immunotherapy: Secondary | ICD-10-CM | POA: Insufficient documentation

## 2018-08-05 DIAGNOSIS — Z79818 Long term (current) use of other agents affecting estrogen receptors and estrogen levels: Secondary | ICD-10-CM | POA: Insufficient documentation

## 2018-08-05 DIAGNOSIS — Z170 Estrogen receptor positive status [ER+]: Secondary | ICD-10-CM | POA: Insufficient documentation

## 2018-08-05 DIAGNOSIS — C50211 Malignant neoplasm of upper-inner quadrant of right female breast: Secondary | ICD-10-CM

## 2018-08-05 DIAGNOSIS — Z95828 Presence of other vascular implants and grafts: Secondary | ICD-10-CM

## 2018-08-05 MED ORDER — GOSERELIN ACETATE 3.6 MG ~~LOC~~ IMPL
3.6000 mg | DRUG_IMPLANT | Freq: Once | SUBCUTANEOUS | Status: AC
  Administered 2018-08-05: 3.6 mg via SUBCUTANEOUS

## 2018-08-05 MED ORDER — GOSERELIN ACETATE 3.6 MG ~~LOC~~ IMPL
DRUG_IMPLANT | SUBCUTANEOUS | Status: AC
  Filled 2018-08-05: qty 3.6

## 2018-08-05 NOTE — Telephone Encounter (Signed)
Pt requested to r/s her appts.

## 2018-08-12 ENCOUNTER — Inpatient Hospital Stay: Payer: BC Managed Care – PPO

## 2018-08-12 ENCOUNTER — Other Ambulatory Visit: Payer: BC Managed Care – PPO

## 2018-08-12 ENCOUNTER — Telehealth: Payer: Self-pay | Admitting: Oncology

## 2018-08-12 VITALS — BP 121/70 | HR 71 | Temp 97.9°F | Resp 16

## 2018-08-12 DIAGNOSIS — Z170 Estrogen receptor positive status [ER+]: Principal | ICD-10-CM

## 2018-08-12 DIAGNOSIS — C50211 Malignant neoplasm of upper-inner quadrant of right female breast: Secondary | ICD-10-CM | POA: Diagnosis not present

## 2018-08-12 LAB — CBC WITH DIFFERENTIAL (CANCER CENTER ONLY)
Abs Immature Granulocytes: 0.03 10*3/uL (ref 0.00–0.07)
BASOS ABS: 0 10*3/uL (ref 0.0–0.1)
Basophils Relative: 0 %
EOS ABS: 0.1 10*3/uL (ref 0.0–0.5)
Eosinophils Relative: 2 %
HCT: 35.6 % — ABNORMAL LOW (ref 36.0–46.0)
Hemoglobin: 12.3 g/dL (ref 12.0–15.0)
IMMATURE GRANULOCYTES: 1 %
Lymphocytes Relative: 21 %
Lymphs Abs: 1 10*3/uL (ref 0.7–4.0)
MCH: 33.2 pg (ref 26.0–34.0)
MCHC: 34.6 g/dL (ref 30.0–36.0)
MCV: 96 fL (ref 80.0–100.0)
Monocytes Absolute: 0.4 10*3/uL (ref 0.1–1.0)
Monocytes Relative: 8 %
NEUTROS PCT: 68 %
Neutro Abs: 3.2 10*3/uL (ref 1.7–7.7)
Platelet Count: 226 10*3/uL (ref 150–400)
RBC: 3.71 MIL/uL — ABNORMAL LOW (ref 3.87–5.11)
RDW: 13.1 % (ref 11.5–15.5)
WBC Count: 4.7 10*3/uL (ref 4.0–10.5)
nRBC: 0 % (ref 0.0–0.2)

## 2018-08-12 LAB — CMP (CANCER CENTER ONLY)
ALT: 25 U/L (ref 0–44)
AST: 23 U/L (ref 15–41)
Albumin: 3.8 g/dL (ref 3.5–5.0)
Alkaline Phosphatase: 92 U/L (ref 38–126)
Anion gap: 8 (ref 5–15)
BUN: 15 mg/dL (ref 6–20)
CHLORIDE: 103 mmol/L (ref 98–111)
CO2: 31 mmol/L (ref 22–32)
Calcium: 9.1 mg/dL (ref 8.9–10.3)
Creatinine: 0.7 mg/dL (ref 0.44–1.00)
GFR, Est AFR Am: >60 mL/min (ref >60)
GLUCOSE: 110 mg/dL — AB (ref 70–99)
POTASSIUM: 3.6 mmol/L (ref 3.5–5.1)
Sodium: 142 mmol/L (ref 135–145)
TOTAL PROTEIN: 6.5 g/dL (ref 6.5–8.1)
Total Bilirubin: 0.6 mg/dL (ref 0.3–1.2)

## 2018-08-12 MED ORDER — SODIUM CHLORIDE 0.9 % IV SOLN
Freq: Once | INTRAVENOUS | Status: AC
  Administered 2018-08-12: 16:00:00 via INTRAVENOUS
  Filled 2018-08-12: qty 250

## 2018-08-12 MED ORDER — SODIUM CHLORIDE 0.9% FLUSH
10.0000 mL | INTRAVENOUS | Status: DC | PRN
  Administered 2018-08-12: 10 mL
  Filled 2018-08-12: qty 10

## 2018-08-12 MED ORDER — DIPHENHYDRAMINE HCL 25 MG PO CAPS
25.0000 mg | ORAL_CAPSULE | Freq: Once | ORAL | Status: AC
  Administered 2018-08-12: 25 mg via ORAL

## 2018-08-12 MED ORDER — ACETAMINOPHEN 325 MG PO TABS
650.0000 mg | ORAL_TABLET | Freq: Once | ORAL | Status: AC
  Administered 2018-08-12: 650 mg via ORAL

## 2018-08-12 MED ORDER — HEPARIN SOD (PORK) LOCK FLUSH 100 UNIT/ML IV SOLN
500.0000 [IU] | Freq: Once | INTRAVENOUS | Status: AC | PRN
  Administered 2018-08-12: 500 [IU]
  Filled 2018-08-12: qty 5

## 2018-08-12 MED ORDER — TRASTUZUMAB CHEMO 150 MG IV SOLR
399.0000 mg | Freq: Once | INTRAVENOUS | Status: AC
  Administered 2018-08-12: 399 mg via INTRAVENOUS
  Filled 2018-08-12: qty 19

## 2018-08-12 MED ORDER — ACETAMINOPHEN 325 MG PO TABS
ORAL_TABLET | ORAL | Status: AC
  Filled 2018-08-12: qty 2

## 2018-08-12 MED ORDER — DIPHENHYDRAMINE HCL 25 MG PO CAPS
ORAL_CAPSULE | ORAL | Status: AC
  Filled 2018-08-12: qty 1

## 2018-08-12 NOTE — Telephone Encounter (Signed)
Pt requested to move appts to later in afternoon

## 2018-08-12 NOTE — Patient Instructions (Signed)
[place] Discharge Instructions for Patients Receiving Chemotherapy  Today you received the following chemotherapy agents: Trastuzumab (Herceptin)  To help prevent nausea and vomiting after your treatment, we encourage you to take your nausea medication as directed.    If you develop nausea and vomiting that is not controlled by your nausea medication, call the clinic.   BELOW ARE SYMPTOMS THAT SHOULD BE REPORTED IMMEDIATELY:  *FEVER GREATER THAN 100.5 F  *CHILLS WITH OR WITHOUT FEVER  NAUSEA AND VOMITING THAT IS NOT CONTROLLED WITH YOUR NAUSEA MEDICATION  *UNUSUAL SHORTNESS OF BREATH  *UNUSUAL BRUISING OR BLEEDING  TENDERNESS IN MOUTH AND THROAT WITH OR WITHOUT PRESENCE OF ULCERS  *URINARY PROBLEMS  *BOWEL PROBLEMS  UNUSUAL RASH Items with * indicate a potential emergency and should be followed up as soon as possible.  Feel free to call the clinic should you have any questions or concerns. The clinic phone number is [phone].  Please show the [place] at check-in to the Emergency Department and triage nurse.

## 2018-08-31 NOTE — Progress Notes (Signed)
[place]  Telephone:([phone]) [phone] Fax:(336) [phone]     ID: Hannah Woodard DOB: 02/25/1971  MR#: [id number]  EML#:[id number]  Patient Care Team: Unk Pinto, MD as PCP - General (Internal Medicine) Jovita Kussmaul, MD as Consulting Physician (General Surgery) Pablo Mathurin, Virgie Dad, MD as Consulting Physician (Oncology) Kyung Rudd, MD as Consulting Physician (Radiation Oncology) Lorelle Gibbs, MD (Radiology) Howard-McNatt, Mable Fill, MD as Referring Physician (Surgery) OTHER MD:  CHIEF COMPLAINT: Triple positive breast cancer  CURRENT TREATMENT: Trastuzumab, anastrozole  INTERVAL HISTORY: Hannah Woodard returns today for follow-up of her triple positive breast cancer.   The patient continues on trastuzumab, which she is tolerating well.   Her last echocardiogram on 06/14/2018 showed an ejection fraction in the 60-65%.   The patient also continues on goserelin, which she is tolerating well.  She does not like receiving the shots, which is the main problem.  She also wonders whether she needs them at all as she thinks she might be menopausal already.  The patient also continues on anastrozole, which she is tolerating well.   She is scheduled for a bone density screening at [facility] on 09/05/2018.  We have added an order for mammography as well    REVIEW OF SYSTEMS: Hannah Woodard is doing well overall. She has been sore since September; her muscles in her right side core feel tight. Her left arm is now stronger than her right arm. She has not been to physical therapy. For exercise, she has a Physiological scientist. The patient denies unusual headaches, visual changes, nausea, vomiting, or dizziness. There has been no unusual cough, phlegm production, or pleurisy. This been no change in bowel or bladder habits. The patient denies unexplained fatigue or unexplained weight loss, bleeding, rash, or fever. A detailed review of systems was otherwise noncontributory.    HISTORY OF  CURRENT ILLNESS: From the original intake note:  The patient has a history of cysts and nodules in the right breast which have been closely followed, with exams 07/30/2016 and 01/28/2017.  On 07/29/2017 follow-up right breast ultrasonography showed no findings of concern.  Bilateral diagnostic mammography 08/18/2017 with right breast ultrasonography on 08/18/2017 at [facility] found the breast density to be category C.  There was now a new irregular high density mass in the posterior portion of the right breast seen on the mediolateral oblique view only.  Ultrasonography confirmed a 1.4 cm lobulated mass in the upper inner quadrant of the right breast 10 cm from the nipple associated with pectoral muscle invasion.  The right axilla was sonographically benign.  Biopsy of this mass 08/24/2017 showed ([id number]) invasive ductal carcinoma, grade 2, estrogen receptor 95% positive, progesterone receptor 100% positive, both with strong staining intensity, with an MIB-1 of 60%, and HER-2 amplified, with a signals ratio of 2.84 (full report pending).  The patient's subsequent history is as detailed below.  PAST MEDICAL HISTORY: Past Medical History:  Diagnosis Date  . Breast cancer ([place])   . Family history of ovarian cancer 09/02/2017  . Family history of prostate cancer in father 09/02/2017  . Family history of uterine cancer 09/02/2017  . Plantar fasciitis     PAST SURGICAL HISTORY: Past Surgical History:  Procedure Laterality Date  . right lumpectomy, sentinel node biopsy, and bilateral mammoplasty  10/2017   [facility]    FAMILY HISTORY Family History  Problem Relation Age of Onset  . Ovarian cancer Mother 43  . Prostate cancer Father 41       'high gleason' unsure  number  . Ulcerative colitis Sister   . Other Sister        abn ovaian growth, partial hysterectomy  . Uterine cancer Maternal Aunt 31  . Stroke Maternal Aunt 66  . Basal cell carcinoma Brother   . Skin cancer Paternal Uncle   .  Skin cancer Maternal Grandmother   . Stroke Paternal Grandfather 85  . Hydrocephalus Cousin   The patient's father died from alcoholic cirrhosis at age 2.  He had been diagnosed with prostate cancer at age 11.  The patient's mother was diagnosed with ovarian cancer at age 48 and died a year later.  The patient has 1 brother, 1 sister.  The patient's brother has had basal cell and other skin cancers.  A paternal aunt had uterine cancer.  Other relatives have had skin cancers but the patient does not know if these were melanomas or not  GYNECOLOGIC HISTORY:  No LMP recorded. (Menstrual status: Chemotherapy).  Menarche age 53, the patient has never been pregnant.  She is still having regular periods.  She used oral contraceptives briefly in the past without complications. She and her husband are very interested in having children, and currently (December 2018) they are not using any contraception.   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  Her husband Rodman Key (goes by [employer]") is a Dealer.  At home it's just them, a [employer] and 2 cats.    ADVANCED DIRECTIVES: Not in place   HEALTH MAINTENANCE: Social History   Tobacco Use  . Smoking status: Never Smoker  . Smokeless tobacco: Never Used  Substance Use Topics  . Alcohol use: Yes  . Drug use: No     Colonoscopy: Never  PAP:  Bone density: Never   Allergies  Allergen Reactions  . Adhesive [Tape]   . Milk-Related Compounds Diarrhea    Stomach cramping  . Poison Ivy Extract [Poison Ivy Extract] Hives, Itching and Rash    Current Outpatient Medications  Medication Sig Dispense Refill  . anastrozole (ARIMIDEX) 1 MG tablet Take 1 tablet (1 mg total) by mouth daily. 30 tablet 6  . Ascorbic Acid (VITAMIN C PO) Take 400 Units by mouth 2 (two) times daily.    Marland Kitchen b complex vitamins tablet Take 1 tablet by mouth daily.    . Cholecalciferol (VITAMIN D3) 5000  units TABS Take 10,000 Units by mouth daily.    . fish oil-omega-3 fatty acids 1000 MG capsule Take 1 g by mouth daily.    Marland Kitchen gabapentin (NEURONTIN) 300 MG capsule Take 1 capsule (300 mg total) by mouth at bedtime. 90 capsule 4  . lidocaine-prilocaine (EMLA) cream Apply 1 application topically as needed. 30 g 0  . OVER THE COUNTER MEDICATION Take 1 tablet by mouth at bedtime as needed (constipation).     . TURMERIC PO Take 1 capsule by mouth as directed.     Marland Kitchen UNABLE TO FIND Take 500 mg by mouth daily. [place] mushroom    . UNABLE TO FIND Take 500 mg by mouth daily. [place] Mushroom    . UNABLE TO FIND Take 5 mg by mouth as directed. CBD Oil; patient takes for nausea      No current facility-administered medications for this visit.     OBJECTIVE: Young white woman in no acute distress  Vitals:   09/02/18 1206  BP: 127/84  Pulse: 71  Resp: 18  Temp: 98 F (36.7 C)  SpO2: (!) 71%     Body mass index is 23.65 kg/m.   Wt Readings from Last 3 Encounters:  09/02/18 146 lb 8 oz (66.5 kg)  07/01/18 144 lb (65.3 kg)  06/14/18 145 lb (65.8 kg)   ECOG FS:1 - Symptomatic but completely ambulatory  Sclerae unicteric, EOMs intact Oropharynx clear and moist No cervical or supraclavicular adenopathy Lungs no rales or rhonchi Heart regular rate and rhythm Abd soft, nontender, positive bowel sounds MSK no focal spinal tenderness, no upper extremity lymphedema Neuro: nonfocal, well oriented, appropriate affect Breasts: The right breast is status post lumpectomy and radiation, with no evidence of disease recurrence.  The left breast is status post reduction mammoplasty.  Both axillae are benign.   LAB RESULTS:  CMP     Component Value Date/Time   NA 142 08/12/2018 1345   NA 138 09/01/2017 1238   K 3.6 08/12/2018 1345   K 4.4 09/01/2017 1238   CL 103 08/12/2018 1345   CO2 31 08/12/2018 1345   CO2 25 09/01/2017 1238   GLUCOSE 110 (H) 08/12/2018 1345   GLUCOSE 83 09/01/2017 1238     BUN 15 08/12/2018 1345   BUN 9.2 09/01/2017 1238   CREATININE 0.70 08/12/2018 1345   CREATININE 0.7 09/01/2017 1238   CALCIUM 9.1 08/12/2018 1345   CALCIUM 9.4 09/01/2017 1238   PROT 6.5 08/12/2018 1345   PROT 7.4 09/01/2017 1238   ALBUMIN 3.8 08/12/2018 1345   ALBUMIN 4.2 09/01/2017 1238   AST 23 08/12/2018 1345   AST 21 09/01/2017 1238   ALT 25 08/12/2018 1345   ALT 21 09/01/2017 1238   ALKPHOS 92 08/12/2018 1345   ALKPHOS 60 09/01/2017 1238   BILITOT 0.6 08/12/2018 1345   BILITOT 0.65 09/01/2017 1238   GFRNONAA >60 08/12/2018 1345   GFRNONAA 111 05/18/2017 0928   GFRAA >60 08/12/2018 1345   GFRAA 128 05/18/2017 0928    No results found for: TOTALPROTELP, ALBUMINELP, A1GS, A2GS, BETS, BETA2SER, GAMS, MSPIKE, SPEI  No results found for: Nils Pyle, [facility]  Lab Results  Component Value Date   WBC 4.6 09/02/2018   NEUTROABS 3.3 09/02/2018   HGB 12.9 09/02/2018   HCT 37.3 09/02/2018   MCV 97.1 09/02/2018   PLT 213 09/02/2018      Chemistry      Component Value Date/Time   NA 142 08/12/2018 1345   NA 138 09/01/2017 1238   K 3.6 08/12/2018 1345   K 4.4 09/01/2017 1238   CL 103 08/12/2018 1345   CO2 31 08/12/2018 1345   CO2 25 09/01/2017 1238   BUN 15 08/12/2018 1345   BUN 9.2 09/01/2017 1238   CREATININE 0.70 08/12/2018 1345   CREATININE 0.7 09/01/2017 1238      Component Value Date/Time   CALCIUM 9.1 08/12/2018 1345   CALCIUM 9.4 09/01/2017 1238   ALKPHOS 92 08/12/2018 1345   ALKPHOS 60 09/01/2017 1238   AST 23 08/12/2018 1345   AST 21 09/01/2017 1238   ALT 25 08/12/2018 1345   ALT 21 09/01/2017 1238   BILITOT 0.6 08/12/2018 1345   BILITOT 0.65 09/01/2017 1238       No results found for: LABCA2  No components found for: [id number]  No results for input(s): INR in the last 168 hours.  No results found for: LABCA2  No results found for: QRF758  No results found for: ITG549  No results found for: IYM415  No results found  for: CA2729  No components found for:  HGQUANT  No results found for: CEA1 / No results found for: CEA1   No results found for: AFPTUMOR  No results found for: CHROMOGRNA  No results found for: PSA1  Appointment on 09/02/2018  Component Date Value Ref Range Status  . WBC Count 09/02/2018 4.6  4.0 - 10.5 K/uL Final  . RBC 09/02/2018 3.84* 3.87 - 5.11 MIL/uL Final  . Hemoglobin 09/02/2018 12.9  12.0 - 15.0 g/dL Final  . HCT 09/02/2018 37.3  36.0 - 46.0 % Final  . MCV 09/02/2018 97.1  80.0 - 100.0 fL Final  . MCH 09/02/2018 33.6  26.0 - 34.0 pg Final  . MCHC 09/02/2018 34.6  30.0 - 36.0 g/dL Final  . RDW 09/02/2018 13.4  11.5 - 15.5 % Final  . Platelet Count 09/02/2018 213  150 - 400 K/uL Final  . nRBC 09/02/2018 0.0  0.0 - 0.2 % Final  . Neutrophils Relative % 09/02/2018 70  % Final  . Neutro Abs 09/02/2018 3.3  1.7 - 7.7 K/uL Final  . Lymphocytes Relative 09/02/2018 18  % Final  . Lymphs Abs 09/02/2018 0.8  0.7 - 4.0 K/uL Final  . Monocytes Relative 09/02/2018 8  % Final  . Monocytes Absolute 09/02/2018 0.4  0.1 - 1.0 K/uL Final  . Eosinophils Relative 09/02/2018 3  % Final  . Eosinophils Absolute 09/02/2018 0.1  0.0 - 0.5 K/uL Final  . Basophils Relative 09/02/2018 1  % Final  . Basophils Absolute 09/02/2018 0.0  0.0 - 0.1 K/uL Final  . Immature Granulocytes 09/02/2018 0  % Final  . Abs Immature Granulocytes 09/02/2018 0.01  0.00 - 0.07 K/uL Final   Performed at [facility], [place] [address], [place], [place] [address]    (this displays the last labs from the last 3 days)  No results found for: TOTALPROTELP, ALBUMINELP, A1GS, A2GS, BETS, BETA2SER, GAMS, MSPIKE, SPEI (this displays SPEP labs)  No results found for: KPAFRELGTCHN, LAMBDASER, KAPLAMBRATIO (kappa/lambda light chains)  No results found for: HGBA, HGBA2QUANT, HGBFQUANT, HGBSQUAN (Hemoglobinopathy evaluation)   No results found for: LDH  Lab Results  Component Value Date   IRON 132  12/21/2017   TIBC 305 12/21/2017   IRONPCTSAT 43 12/21/2017   (Iron and TIBC)  Lab Results  Component Value Date   FERRITIN 67 12/16/2016    Urinalysis    Component Value Date/Time   COLORURINE AMBER (A) 12/27/2017 1905   APPEARANCEUR HAZY (A) 12/27/2017 1905   LABSPEC 1.005 12/27/2017 1905   PHURINE 7.0 12/27/2017 1905   GLUCOSEU NEGATIVE 12/27/2017 1905   HGBUR NEGATIVE 12/27/2017 1905   BILIRUBINUR NEGATIVE 12/27/2017 1905   KETONESUR NEGATIVE 12/27/2017 1905   PROTEINUR NEGATIVE 12/27/2017 1905   UROBILINOGEN 0.2 12/06/2014 1529   NITRITE NEGATIVE 12/27/2017 1905   LEUKOCYTESUR TRACE (A) 12/27/2017 1905    STUDIES: Echo results reviewed  ELIGIBLE FOR AVAILABLE RESEARCH PROTOCOL: No  ASSESSMENT: 47 y.o. [place] woman status post right breast upper inner quadrant biopsy 08/24/2017 for a clinical T1c N0 invasive ductal carcinoma, triple positive, with an MIB-1 of 60%  (1) genetics testing 09/10/2017 through the Common Hereditary Cancer Panel + Melanoma Panel found no deleterious mutations in: APC, ATM, AXIN2, BAP1, BARD1, BMPR1A, BRCA1, BRCA2, BRIP1, CDH1, CDK4, CDKN2A (p14ARF), CDKN2A (p16INK4a), CHEK2, CTNNA1, DICER1, EPCAM*, GREM1*, KIT, MEN1, MLH1, MSH2, MSH3, MSH6, MUTYH, NBN, NF1, PALB2, PDGFRA, PMS2, POLD1, POLE, POT1, PTEN, RAD50, RAD51C, RAD51D, RB1, SDHB, SDHC, SDHD, SMAD4, SMARCA4, STK11, TP53, TSC1, TSC2, VHL. 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  (a) baseline echocardiogram 09/10/2017 shows an ejection fraction of 55-60%  (b) echocardiogram 12/09/2017 showed an ejection fraction in the 60-65% range  (c) echo in 06/14/2018 shows EF of 60-65%  (5) adjuvant radiation05/05/2018 - 03/18/2018 Site/dose:   The patient initially received a dose of 50.4 Gy in 28 fractions to the breast using whole-breast tangent fields. This was delivered using a 3-D conformal technique. The patient then received a boost to the seroma. This delivered an additional 10 Gy in 5 fractions using 9e electrons with a special teletherapy technique. The total dose was 60.4 Gy.   (6) anastrozole started 03/28/2018  (a) bone density 09/05/2018  (b) patient refuses tamoxifen because of family history of strokes and uterine cancer  (7) goserelin started 09/29/2017, discontinued after 08/05/2018 dose  PLAN: Hannah Woodard has largely recovered from her surgery and radiation but she is having some right upper chest and right shoulder issues.  Apparently she never mated to physical therapy.  I have placed that referral for her today.  I think once she learns the proper exercises she will be able to get over that particular disability.  We primarily started her on goserelin for fertility prevention but at the same time she is kind of hoping that she is postmenopausal so that she does not need the shots anymore.  She absolutely does not want to be on tamoxifen because of a family history of strokes and uterine cancer.  What she would like to do is stop the goserelin and then check her [facility] and estradiol over the next year.  Assuming she remains menopausal, then she will simply continue anastrozole for a total of 5 years.  However if her periods resume she will need to go back on anastrozole  I have written to have her [facility] and estradiol checked early January and early March  She will complete her Herceptin treatments early  February.  She will see me again late March and we will initiate long-term follow-up at that point  She knows to call for any other issues that may develop before the next visit.   Amaad Byers, Virgie Dad, MD  09/02/18 12:21 PM Medical Oncology and Hematology [facility] [address] [place], [place] [address] Tel. [phone]    Fax. [phone]   I, Jacqualyn Posey am acting as a Education administrator for Chauncey Cruel, MD.   I, Lurline Del MD, have reviewed the above documentation for accuracy and completeness, and I agree with the above.

## 2018-09-01 ENCOUNTER — Other Ambulatory Visit: Payer: Self-pay | Admitting: *Deleted

## 2018-09-01 DIAGNOSIS — Z170 Estrogen receptor positive status [ER+]: Principal | ICD-10-CM

## 2018-09-01 DIAGNOSIS — C50211 Malignant neoplasm of upper-inner quadrant of right female breast: Secondary | ICD-10-CM

## 2018-09-02 ENCOUNTER — Encounter: Payer: Self-pay | Admitting: *Deleted

## 2018-09-02 ENCOUNTER — Telehealth: Payer: Self-pay | Admitting: Oncology

## 2018-09-02 ENCOUNTER — Inpatient Hospital Stay: Payer: BC Managed Care – PPO

## 2018-09-02 ENCOUNTER — Inpatient Hospital Stay (HOSPITAL_BASED_OUTPATIENT_CLINIC_OR_DEPARTMENT_OTHER): Payer: BC Managed Care – PPO | Admitting: Oncology

## 2018-09-02 ENCOUNTER — Inpatient Hospital Stay: Payer: BC Managed Care – PPO | Attending: Oncology

## 2018-09-02 VITALS — BP 127/84 | HR 71 | Temp 98.0°F | Resp 18 | Ht 66.0 in | Wt 146.5 lb

## 2018-09-02 DIAGNOSIS — Z8041 Family history of malignant neoplasm of ovary: Secondary | ICD-10-CM

## 2018-09-02 DIAGNOSIS — C50211 Malignant neoplasm of upper-inner quadrant of right female breast: Secondary | ICD-10-CM

## 2018-09-02 DIAGNOSIS — Z79899 Other long term (current) drug therapy: Secondary | ICD-10-CM | POA: Diagnosis not present

## 2018-09-02 DIAGNOSIS — Z170 Estrogen receptor positive status [ER+]: Secondary | ICD-10-CM | POA: Diagnosis not present

## 2018-09-02 DIAGNOSIS — Z95828 Presence of other vascular implants and grafts: Secondary | ICD-10-CM

## 2018-09-02 DIAGNOSIS — Z823 Family history of stroke: Secondary | ICD-10-CM

## 2018-09-02 DIAGNOSIS — Z808 Family history of malignant neoplasm of other organs or systems: Secondary | ICD-10-CM

## 2018-09-02 DIAGNOSIS — Z5112 Encounter for antineoplastic immunotherapy: Secondary | ICD-10-CM | POA: Diagnosis not present

## 2018-09-02 DIAGNOSIS — Z8042 Family history of malignant neoplasm of prostate: Secondary | ICD-10-CM | POA: Diagnosis not present

## 2018-09-02 DIAGNOSIS — Z79811 Long term (current) use of aromatase inhibitors: Secondary | ICD-10-CM

## 2018-09-02 LAB — CBC WITH DIFFERENTIAL (CANCER CENTER ONLY)
Abs Immature Granulocytes: 0.01 10*3/uL (ref 0.00–0.07)
BASOS ABS: 0 10*3/uL (ref 0.0–0.1)
Basophils Relative: 1 %
EOS ABS: 0.1 10*3/uL (ref 0.0–0.5)
Eosinophils Relative: 3 %
HEMATOCRIT: 37.3 % (ref 36.0–46.0)
Hemoglobin: 12.9 g/dL (ref 12.0–15.0)
Immature Granulocytes: 0 %
LYMPHS ABS: 0.8 10*3/uL (ref 0.7–4.0)
Lymphocytes Relative: 18 %
MCH: 33.6 pg (ref 26.0–34.0)
MCHC: 34.6 g/dL (ref 30.0–36.0)
MCV: 97.1 fL (ref 80.0–100.0)
Monocytes Absolute: 0.4 10*3/uL (ref 0.1–1.0)
Monocytes Relative: 8 %
NRBC: 0 % (ref 0.0–0.2)
Neutro Abs: 3.3 10*3/uL (ref 1.7–7.7)
Neutrophils Relative %: 70 %
Platelet Count: 213 10*3/uL (ref 150–400)
RBC: 3.84 MIL/uL — ABNORMAL LOW (ref 3.87–5.11)
RDW: 13.4 % (ref 11.5–15.5)
WBC Count: 4.6 10*3/uL (ref 4.0–10.5)

## 2018-09-02 LAB — CMP (CANCER CENTER ONLY)
ALT: 23 U/L (ref 0–44)
AST: 23 U/L (ref 15–41)
Albumin: 4 g/dL (ref 3.5–5.0)
Alkaline Phosphatase: 97 U/L (ref 38–126)
Anion gap: 10 (ref 5–15)
BUN: 18 mg/dL (ref 6–20)
CO2: 27 mmol/L (ref 22–32)
Calcium: 9 mg/dL (ref 8.9–10.3)
Chloride: 107 mmol/L (ref 98–111)
Creatinine: 0.75 mg/dL (ref 0.44–1.00)
GFR, Est AFR Am: >60 mL/min
GFR, Estimated: >60 mL/min
Glucose, Bld: 93 mg/dL (ref 70–99)
Potassium: 4 mmol/L (ref 3.5–5.1)
Sodium: 144 mmol/L (ref 135–145)
Total Bilirubin: 0.6 mg/dL (ref 0.3–1.2)
Total Protein: 6.7 g/dL (ref 6.5–8.1)

## 2018-09-02 MED ORDER — GOSERELIN ACETATE 3.6 MG ~~LOC~~ IMPL: 3.6000 mg | DRUG_IMPLANT | Freq: Once | SUBCUTANEOUS | Status: DC

## 2018-09-02 MED ORDER — SODIUM CHLORIDE 0.9% FLUSH
10.0000 mL | INTRAVENOUS | Status: DC | PRN
  Administered 2018-09-02: 10 mL
  Filled 2018-09-02: qty 10

## 2018-09-02 MED ORDER — DIPHENHYDRAMINE HCL 25 MG PO CAPS
25.0000 mg | ORAL_CAPSULE | Freq: Once | ORAL | Status: AC
  Administered 2018-09-02: 25 mg via ORAL

## 2018-09-02 MED ORDER — TRASTUZUMAB CHEMO 150 MG IV SOLR
399.0000 mg | Freq: Once | INTRAVENOUS | Status: AC
  Administered 2018-09-02: 399 mg via INTRAVENOUS
  Filled 2018-09-02: qty 19

## 2018-09-02 MED ORDER — DIPHENHYDRAMINE HCL 25 MG PO CAPS
ORAL_CAPSULE | ORAL | Status: AC
  Filled 2018-09-02: qty 1

## 2018-09-02 MED ORDER — SODIUM CHLORIDE 0.9% FLUSH
10.0000 mL | Freq: Once | INTRAVENOUS | Status: AC
  Administered 2018-09-02: 10 mL
  Filled 2018-09-02: qty 10

## 2018-09-02 MED ORDER — HEPARIN SOD (PORK) LOCK FLUSH 100 UNIT/ML IV SOLN
500.0000 [IU] | Freq: Once | INTRAVENOUS | Status: AC | PRN
  Administered 2018-09-02: 500 [IU]
  Filled 2018-09-02: qty 5

## 2018-09-02 MED ORDER — SODIUM CHLORIDE 0.9 % IV SOLN
Freq: Once | INTRAVENOUS | Status: AC
  Administered 2018-09-02: 13:00:00 via INTRAVENOUS
  Filled 2018-09-02: qty 250

## 2018-09-02 MED ORDER — ACETAMINOPHEN 325 MG PO TABS
650.0000 mg | ORAL_TABLET | Freq: Once | ORAL | Status: AC
  Administered 2018-09-02: 650 mg via ORAL

## 2018-09-02 MED ORDER — ACETAMINOPHEN 325 MG PO TABS
ORAL_TABLET | ORAL | Status: AC
  Filled 2018-09-02: qty 2

## 2018-09-02 NOTE — Telephone Encounter (Signed)
Gave patient avs.  Patient did not need calendar.

## 2018-09-02 NOTE — Patient Instructions (Signed)
[facility] Discharge Instructions for Patients Receiving Chemotherapy  Today you received the following chemotherapy agents Herceptin  To help prevent nausea and vomiting after your treatment, we encourage you to take your nausea medication as directed   If you develop nausea and vomiting that is not controlled by your nausea medication, call the clinic.   BELOW ARE SYMPTOMS THAT SHOULD BE REPORTED IMMEDIATELY:  *FEVER GREATER THAN 100.5 F  *CHILLS WITH OR WITHOUT FEVER  NAUSEA AND VOMITING THAT IS NOT CONTROLLED WITH YOUR NAUSEA MEDICATION  *UNUSUAL SHORTNESS OF BREATH  *UNUSUAL BRUISING OR BLEEDING  TENDERNESS IN MOUTH AND THROAT WITH OR WITHOUT PRESENCE OF ULCERS  *URINARY PROBLEMS  *BOWEL PROBLEMS  UNUSUAL RASH Items with * indicate a potential emergency and should be followed up as soon as possible.  Feel free to call the clinic should you have any questions or concerns. The clinic phone number is [phone].  Please show the CHEMO ALERT CARD at check-in to the Emergency Department and triage nurse.   

## 2018-09-05 ENCOUNTER — Encounter: Payer: Self-pay | Admitting: Adult Health

## 2018-09-07 ENCOUNTER — Telehealth: Payer: Self-pay

## 2018-09-07 NOTE — Telephone Encounter (Signed)
Spoke to patient to inform of normal BD and to continue with vit. D, multivitamin and weight bearing exercise per NP.  Patient voiced understanding.

## 2018-09-08 ENCOUNTER — Ambulatory Visit: Payer: BC Managed Care – PPO | Attending: Oncology | Admitting: Rehabilitation

## 2018-09-08 ENCOUNTER — Ambulatory Visit: Payer: BC Managed Care – PPO | Admitting: Rehabilitation

## 2018-09-08 ENCOUNTER — Other Ambulatory Visit: Payer: Self-pay

## 2018-09-08 DIAGNOSIS — Z170 Estrogen receptor positive status [ER+]: Secondary | ICD-10-CM | POA: Diagnosis present

## 2018-09-08 DIAGNOSIS — M25511 Pain in right shoulder: Secondary | ICD-10-CM | POA: Diagnosis present

## 2018-09-08 DIAGNOSIS — L599 Disorder of the skin and subcutaneous tissue related to radiation, unspecified: Secondary | ICD-10-CM | POA: Insufficient documentation

## 2018-09-08 DIAGNOSIS — C50211 Malignant neoplasm of upper-inner quadrant of right female breast: Secondary | ICD-10-CM | POA: Insufficient documentation

## 2018-09-08 DIAGNOSIS — R293 Abnormal posture: Secondary | ICD-10-CM | POA: Insufficient documentation

## 2018-09-08 DIAGNOSIS — M25611 Stiffness of right shoulder, not elsewhere classified: Secondary | ICD-10-CM | POA: Insufficient documentation

## 2018-09-08 DIAGNOSIS — G8929 Other chronic pain: Secondary | ICD-10-CM | POA: Diagnosis present

## 2018-09-08 NOTE — Therapy (Signed)
[place], [place], [address] Phone: [phone]   Fax:  [phone]  Physical Therapy Evaluation  Patient Details  Name: Hannah Woodard MRN: [id number] Date of Birth: [birth year] Referring Provider (PT): Dr. Jana Hakim   Encounter Date: 09/08/2018  PT End of Session - 09/08/18 1623    Visit Number  1    Number of Visits  4    Date for PT Re-Evaluation  10/06/18    PT Start Time  6962    PT Stop Time  1600    PT Time Calculation (min)  43 min    Activity Tolerance  Patient tolerated treatment well    Behavior During Therapy  [facility] for tasks assessed/performed       Past Medical History:  Diagnosis Date  . Breast cancer ([place])   . Family history of ovarian cancer 09/02/2017  . Family history of prostate cancer in father 09/02/2017  . Family history of uterine cancer 09/02/2017  . Plantar fasciitis     Past Surgical History:  Procedure Laterality Date  . right lumpectomy, sentinel node biopsy, and bilateral mammoplasty  10/2017   [facility]    There were no vitals filed for this visit.   Subjective Assessment - 09/08/18 1519    Subjective  its more my chest and armpit; tightness and pain    Pertinent History  status post right lumpectomy and sentinel axillary lymph node sampling 10/07/2017 at [facility] for a pT1C pN0, stage IA invasive ductal carcinoma, grade 3, with negative margins, 1 node removed, adjuvant chemotherapy consisting of paclitaxel weekly x12 and trastuzumab every 21 days STARTING 11/12/2017, adjuvant radiation05/05/2018 - 03/18/2018 , on anastrazole    Patient Stated Goals  decrease Rt pectoralis tightness    Currently in Pain?  No/denies   I only get pain when I use it overhead         [facility] PT Assessment - 09/08/18 0001      Assessment   Medical Diagnosis  Rt breast cancer    Referring Provider (PT)  Dr. Jana Hakim    Onset Date/Surgical Date  10/07/17    Hand Dominance   Left    Prior Therapy  no      Precautions   Precaution Comments  cancer, lymphedema, L port removal       Restrictions   Weight Bearing Restrictions  No    Other Position/Activity Restrictions  n      Balance Screen   Has the patient fallen in the past 6 months  No    Has the patient had a decrease in activity level because of a fear of falling?   No    Is the patient reluctant to leave their home because of a fear of falling?   No      Home Film/video editor residence    Living Arrangements  Spouse/significant other    Available Help at Discharge  Family      Prior Function   Level of Independence  Independent    Vocation  Full time employment    Vocation Requirements  speech/signing assistant      Cognition   Overall Cognitive Status  Within Functional Limits for tasks assessed      Observation/Other Assessments   Observations  well healed bilateral breast reduction, Rt lumpectomy and Rt SLNB incisions, no edema, no radiation skin changes except Rt nipple a bit darker      ROM /  Strength   AROM / PROM / Strength  AROM;PROM;Strength      AROM   AROM Assessment Site  Shoulder    Right/Left Shoulder  Right;Left    Right Shoulder Flexion  155 Degrees    Right Shoulder ABduction  145 Degrees    Right Shoulder Internal Rotation  85 Degrees    Right Shoulder External Rotation  90 Degrees    Right Shoulder Horizontal ABduction  40 Degrees    Left Shoulder Flexion  165 Degrees    Left Shoulder ABduction  165 Degrees    Left Shoulder Internal Rotation  80 Degrees    Left Shoulder External Rotation  90 Degrees    Left Shoulder Horizontal ABduction  40 Degrees      PROM   PROM Assessment Site  Shoulder    Right/Left Shoulder  Right;Left        LYMPHEDEMA/ONCOLOGY QUESTIONNAIRE - 09/08/18 1520      Type   Cancer Type  Rt breast cancer      Surgeries   Lumpectomy Date  10/07/17   bil breast reduction   Sentinel Lymph Node Biopsy Date   10/07/17    Number Lymph Nodes Removed  1      Treatment   Active Chemotherapy Treatment  No    Past Chemotherapy Treatment  Yes    Date  12/10/17    Active Radiation Treatment  No    Past Radiation Treatment  Yes    Date  03/18/18    Body Site  Rt breast    Current Hormone Treatment  Yes    Drug Name  anastrozole      What other symptoms do you have   Are you Having Heaviness or Tightness  No    Are you having Pain  No          Hannah Woodard - 09/08/18 0001    Open a tight or new jar  Moderate difficulty    Do heavy household chores (wash walls, wash floors)  Mild difficulty    Carry a shopping bag or briefcase  Moderate difficulty    Wash your back  Mild difficulty    Use a knife to cut food  No difficulty    Recreational activities in which you take some force or impact through your arm, shoulder, or hand (golf, hammering, tennis)  Mild difficulty    During the past week, to what extent has your arm, shoulder or hand problem interfered with your normal social activities with family, friends, neighbors, or groups?  Modererately    During the past week, to what extent has your arm, shoulder or hand problem limited your work or other regular daily activities  Modererately    Arm, shoulder, or hand pain.  Mild    Tingling (pins and needles) in your arm, shoulder, or hand  Mild    Difficulty Sleeping  Mild difficulty    DASH Score  31.82 %        Objective measurements completed on examination: See above findings.      [place] Adult PT Treatment/Exercise - 09/08/18 0001      Exercises   Exercises  Other Exercises    Other Exercises   Given open book, single arm pect stretch, supine on ball Y and T stretches with performance of the first 2 x 3             PT Education - 09/08/18 1623    Education Details  Given open  book, single arm pect stretch, supine on ball Y and T stretches with performance of the first 2 x 3    Person(s) Educated  Patient    Methods   Explanation;Demonstration;Tactile cues;Verbal cues;Handout    Comprehension  Verbalized understanding;Returned demonstration;Verbal cues required;Tactile cues required          PT Long Term Goals - 09/08/18 1630      PT LONG TERM GOAL #1   Title  Pt will improve Rt shoulder AROM to full with no pulling    Time  4    Period  Weeks    Status  New    Target Date  10/06/18      PT LONG TERM GOAL #2   Title  Pt will be ind with strengthening and stretching for use with her trainer and at home     Time  4    Period  Weeks    Status  New    Target Date  10/06/17      PT LONG TERM GOAL #3   Title  Pt will be educated on lymphedema     Time  4    Period  Weeks    Status  New    Target Date  10/06/17             Plan - 09/08/18 1624    Clinical Impression Statement  Hannah Woodard presents today with overall good status but with mild pulling into the pectoralis with end range abduction and flexion AROM and with pectoralis stretched positions in supine.  Pt is very loose overall double jointed at the elbow.  she has made healthy lifestyle modifications since her diagnosis eating no sugar or carbohydates, and starting to exercise with a trainer 1x per week.  She reports that her Rt arm just doesn't move as well or feel as strong.  MMT is equal between sides.  She will benefit from a few PT visits to become ind with home exercises     History and Personal Factors relevant to plan of care:  status post right lumpectomy and sentinel axillary lymph node sampling 10/07/2017 at [facility] for a pT1C pN0, stage IA invasive ductal carcinoma, grade 3, with negative margins, 1 node removed, adjuvant chemotherapy consisting of paclitaxel weekly x12 and trastuzumab every 21 days STARTING 11/12/2017, adjuvant radiation05/05/2018 - 03/18/2018 , on anastrazole    Clinical Presentation  Stable    Clinical Decision Making  Moderate    Rehab Potential  Excellent    Clinical Impairments Affecting Rehab  Potential  cost after insurance year ends    PT Frequency  1x / week    PT Duration  4 weeks    PT Treatment/Interventions  ADLs/Self Care Home Management;Therapeutic exercise;Manual techniques    PT Next Visit Plan  how were stretches, teach supine scap series, give strength ABC or more visits?, STM    PT Home Exercise Plan  openbook, single arm doorway, T and Y over ball     Consulted and Agree with Plan of Care  Patient       Patient will benefit from skilled therapeutic intervention in order to improve the following deficits and impairments:  Decreased range of motion, Decreased activity tolerance, Decreased knowledge of precautions, Impaired UE functional use  Visit Diagnosis: Malignant neoplasm of upper-inner quadrant of right breast in female, estrogen receptor positive (HCC)  Shoulder stiffness, right  Chronic right shoulder pain  Disorder of the skin and subcutaneous tissue related to radiation, unspecified  Problem List Patient Active Problem List   Diagnosis Date Noted  . Neuropathy due to chemotherapeutic drug ([place]) 12/31/2017  . Port-A-Cath in place 11/12/2017  . Genetic testing 09/15/2017  . Malignant neoplasm of upper-inner quadrant of right breast in female, estrogen receptor positive ([place]) 08/27/2017  . Vitamin D deficiency 12/09/2015  . Medication management 12/09/2015  . Dense breast tissue 12/09/2015    Shan Levans, PT 09/08/2018, 4:31 PM  [place], [place], [address] Phone: [phone]   Fax:  [phone]  Name: Hannah Woodard MRN: [id number] Date of Birth: 1971/07/21

## 2018-09-08 NOTE — Patient Instructions (Signed)
Given open book, single arm pect stretch, supine on ball Y and T stretches with performance of the first 2 x 3 No gym restrictions

## 2018-09-15 ENCOUNTER — Ambulatory Visit: Payer: BC Managed Care – PPO | Admitting: Rehabilitation

## 2018-09-19 ENCOUNTER — Other Ambulatory Visit: Payer: Self-pay | Admitting: Oncology

## 2018-09-20 ENCOUNTER — Ambulatory Visit: Payer: BC Managed Care – PPO

## 2018-09-20 DIAGNOSIS — M25611 Stiffness of right shoulder, not elsewhere classified: Secondary | ICD-10-CM

## 2018-09-20 DIAGNOSIS — L599 Disorder of the skin and subcutaneous tissue related to radiation, unspecified: Secondary | ICD-10-CM

## 2018-09-20 DIAGNOSIS — C50211 Malignant neoplasm of upper-inner quadrant of right female breast: Secondary | ICD-10-CM | POA: Diagnosis not present

## 2018-09-20 DIAGNOSIS — R293 Abnormal posture: Secondary | ICD-10-CM

## 2018-09-20 DIAGNOSIS — Z170 Estrogen receptor positive status [ER+]: Principal | ICD-10-CM

## 2018-09-20 DIAGNOSIS — M25511 Pain in right shoulder: Secondary | ICD-10-CM

## 2018-09-20 DIAGNOSIS — G8929 Other chronic pain: Secondary | ICD-10-CM

## 2018-09-20 NOTE — Therapy (Addendum)
[place], [place], [address] Phone: [phone]   Fax:  [phone]  Physical Therapy Treatment  Patient Details  Name: Hannah Woodard MRN: [id number] Date of Birth: 1971/01/28 Referring Provider (PT): Dr. Jana Hakim   Encounter Date: 09/20/2018  PT End of Session - 09/20/18 0851    Visit Number  2    Number of Visits  4    Date for PT Re-Evaluation  10/06/18    PT Start Time  0806    PT Stop Time  0850    PT Time Calculation (min)  44 min    Activity Tolerance  Patient tolerated treatment well    Behavior During Therapy  [facility] for tasks assessed/performed       Past Medical History:  Diagnosis Date  . Breast cancer ([place])   . Family history of ovarian cancer 09/02/2017  . Family history of prostate cancer in father 09/02/2017  . Family history of uterine cancer 09/02/2017  . Plantar fasciitis     Past Surgical History:  Procedure Laterality Date  . right lumpectomy, sentinel node biopsy, and bilateral mammoplasty  10/2017   [facility]    There were no vitals filed for this visit.  Subjective Assessment - 09/20/18 0812    Subjective  I just got back from walking around [facility]. for past 2 days and my legs are really tired. My tightness is doing well overall, just still tight at posterior Rt shoulder and lateral trunk.    Pertinent History  status post right lumpectomy and sentinel axillary lymph node sampling 10/07/2017 at [facility] for a pT1C pN0, stage IA invasive ductal carcinoma, grade 3, with negative margins, 1 node removed, adjuvant chemotherapy consisting of paclitaxel weekly x12 and trastuzumab every 21 days STARTING 11/12/2017, adjuvant radiation05/05/2018 - 03/18/2018 , on anastrazole    Patient Stated Goals  decrease Rt pectoralis tightness    Currently in Pain?  No/denies                       [facility] Adult PT Treatment/Exercise - 09/20/18 0001      Shoulder Exercises:  Supine   Horizontal ABduction  Strengthening;Both;10 reps;Theraband    Theraband Level (Shoulder Horizontal ABduction)  Level 2 (Red)    External Rotation  Strengthening;Both;10 reps;Theraband    Theraband Level (Shoulder External Rotation)  Level 2 (Red)    Flexion  Strengthening;Both;10 reps;Theraband   Narrow and Wide Grip, 10 times each way   Theraband Level (Shoulder Flexion)  Level 2 (Red)    Diagonals  Strengthening;Right;Left;10 reps;Theraband    Theraband Level (Shoulder Diagonals)  Level 2 (Red)      Manual Therapy   Manual Therapy  Passive ROM;Myofascial release;Scapular mobilization    Myofascial Release  To Rt axilla and lateral trunk    Scapular Mobilization  in Lt S/L for Rt scapular mobs into protraction and retraction    Passive ROM  In Supine to Rt shoulder into end ROM flexion and abduction                   PT Long Term Goals - 09/08/18 1630      PT LONG TERM GOAL #1   Title  Pt will improve Rt shoulder AROM to full with no pulling    Time  4    Period  Weeks    Status  New    Target Date  10/06/18      PT LONG  TERM GOAL #2   Title  Pt will be ind with strengthening and stretching for use with her trainer and at home     Time  4    Period  Weeks    Status  New    Target Date  10/06/17      PT LONG TERM GOAL #3   Title  Pt will be educated on lymphedema     Time  4    Period  Weeks    Status  New    Target Date  10/06/17            Plan - 09/20/18 0851    Clinical Impression Statement  Pt willing to come back for a few more visits after answering her questions regarding insurance. She was worried about having a large bill after each session and explained to her that she may only have a copay but to talk to [facility], our Network engineer and for her to f/u with her insurance as well. Progressed her HEP to include supine scapular series which she tolerated well. Then focused on myofascial release to Rt axilla and lateral trunk. With scapular mobs pt  reported tenderness here but trigger point release palpated during and then pt with increased scapular mobililty after. Also issued Strength ABC Program with brief verbal instructions as no time to perform today. Pt to try at home and then if she returns will further instruct at next session.    Rehab Potential  Excellent    Clinical Impairments Affecting Rehab Potential  cost after insurance year ends    PT Frequency  1x / week    PT Duration  4 weeks    PT Treatment/Interventions  ADLs/Self Care Home Management;Therapeutic exercise;Manual techniques    PT Next Visit Plan   measure circumference, how were stretches, review supine scap series, instruct in strength ABC (pt was issued handout today for this in case she doesn't return) or more visits?, STM    Consulted and Agree with Plan of Care  Patient       Patient will benefit from skilled therapeutic intervention in order to improve the following deficits and impairments:  Decreased range of motion, Decreased activity tolerance, Decreased knowledge of precautions, Impaired UE functional use  Visit Diagnosis: Malignant neoplasm of upper-inner quadrant of right breast in female, estrogen receptor positive (HCC)  Shoulder stiffness, right  Chronic right shoulder pain  Disorder of the skin and subcutaneous tissue related to radiation, unspecified  Abnormal posture     Problem List Patient Active Problem List   Diagnosis Date Noted  . Neuropathy due to chemotherapeutic drug ([place]) 12/31/2017  . Port-A-Cath in place 11/12/2017  . Genetic testing 09/15/2017  . Malignant neoplasm of upper-inner quadrant of right breast in female, estrogen receptor positive ([place]) 08/27/2017  . Vitamin D deficiency 12/09/2015  . Medication management 12/09/2015  . Dense breast tissue 12/09/2015    Otelia Limes, PTA 09/20/2018, 10:11 AM  [place], [place],  [address] Phone: [phone]   Fax:  [phone]  Name: Hannah Woodard MRN: [id number] Date of Birth: Sep 07, 1971   PHYSICAL THERAPY DISCHARGE SUMMARY  Visits from Start of Care: 2  Current functional level related to goals / functional outcomes: See above   Remaining deficits: See above   Education / Equipment: Given final HEP Plan: Patient agrees to discharge.  Patient goals were partially met. Patient is being discharged due to financial reasons.  ?????  Shan Levans, PT

## 2018-09-20 NOTE — Patient Instructions (Signed)
Over Head Pull: Narrow and Wide Grip   Cancer Rehab [phone]   On back, knees bent, feet flat, band across thighs, elbows straight but relaxed. Pull hands apart (start). Keeping elbows straight, bring arms up and over head, hands toward floor. Keep pull steady on band. Hold momentarily. Return slowly, keeping pull steady, back to start. Then do same with a wider grip on the band (past shoulder width) Repeat _5-10__ times. Band color __red____   Side Pull: Double Arm   On back, knees bent, feet flat. Arms perpendicular to body, shoulder level, elbows straight but relaxed. Pull arms out to sides, elbows straight. Resistance band comes across collarbones, hands toward floor. Hold momentarily. Slowly return to starting position. Repeat _5-10__ times. Band color _red____   Sword   On back, knees bent, feet flat, left hand on left hip, right hand above left. Pull right arm DIAGONALLY (hip to shoulder) across chest. Bring right arm along head toward floor. Hold momentarily. Slowly return to starting position. Repeat _5-10__ times. Do with left arm. Band color _red_____   Shoulder Rotation: Double Arm   On back, knees bent, feet flat, elbows tucked at sides, bent 90, hands palms up. Pull hands apart and down toward floor, keeping elbows near sides. Hold momentarily. Slowly return to starting position. Repeat _5-10__ times. Band color __red____

## 2018-09-23 ENCOUNTER — Inpatient Hospital Stay: Payer: BC Managed Care – PPO

## 2018-09-23 VITALS — BP 132/90 | HR 68 | Temp 98.1°F | Resp 16

## 2018-09-23 DIAGNOSIS — Z95828 Presence of other vascular implants and grafts: Secondary | ICD-10-CM

## 2018-09-23 DIAGNOSIS — C50211 Malignant neoplasm of upper-inner quadrant of right female breast: Secondary | ICD-10-CM

## 2018-09-23 DIAGNOSIS — Z170 Estrogen receptor positive status [ER+]: Principal | ICD-10-CM

## 2018-09-23 MED ORDER — SODIUM CHLORIDE 0.9% FLUSH
10.0000 mL | Freq: Once | INTRAVENOUS | Status: AC
  Administered 2018-09-23: 10 mL
  Filled 2018-09-23: qty 10

## 2018-09-23 MED ORDER — HEPARIN SOD (PORK) LOCK FLUSH 100 UNIT/ML IV SOLN
500.0000 [IU] | Freq: Once | INTRAVENOUS | Status: DC | PRN
  Filled 2018-09-23: qty 5

## 2018-09-23 MED ORDER — DIPHENHYDRAMINE HCL 25 MG PO CAPS
25.0000 mg | ORAL_CAPSULE | Freq: Once | ORAL | Status: AC
  Administered 2018-09-23: 25 mg via ORAL

## 2018-09-23 MED ORDER — SODIUM CHLORIDE 0.9 % IV SOLN
Freq: Once | INTRAVENOUS | Status: AC
  Administered 2018-09-23: 13:00:00 via INTRAVENOUS
  Filled 2018-09-23: qty 250

## 2018-09-23 MED ORDER — ACETAMINOPHEN 325 MG PO TABS
650.0000 mg | ORAL_TABLET | Freq: Once | ORAL | Status: AC
  Administered 2018-09-23: 650 mg via ORAL

## 2018-09-23 MED ORDER — TRASTUZUMAB CHEMO 150 MG IV SOLR
6.0000 mg/kg | Freq: Once | INTRAVENOUS | Status: AC
  Administered 2018-09-23: 399 mg via INTRAVENOUS
  Filled 2018-09-23: qty 19

## 2018-09-23 MED ORDER — DIPHENHYDRAMINE HCL 25 MG PO CAPS
ORAL_CAPSULE | ORAL | Status: AC
  Filled 2018-09-23: qty 1

## 2018-09-23 MED ORDER — SODIUM CHLORIDE 0.9% FLUSH
10.0000 mL | INTRAVENOUS | Status: DC | PRN
  Filled 2018-09-23: qty 10

## 2018-09-23 MED ORDER — ACETAMINOPHEN 325 MG PO TABS
ORAL_TABLET | ORAL | Status: AC
  Filled 2018-09-23: qty 2

## 2018-09-23 NOTE — Patient Instructions (Signed)
[facility] Discharge Instructions for Patients Receiving Chemotherapy  Today you received the following chemotherapy agents Herceptin  To help prevent nausea and vomiting after your treatment, we encourage you to take your nausea medication as directed   If you develop nausea and vomiting that is not controlled by your nausea medication, call the clinic.   BELOW ARE SYMPTOMS THAT SHOULD BE REPORTED IMMEDIATELY:  *FEVER GREATER THAN 100.5 F  *CHILLS WITH OR WITHOUT FEVER  NAUSEA AND VOMITING THAT IS NOT CONTROLLED WITH YOUR NAUSEA MEDICATION  *UNUSUAL SHORTNESS OF BREATH  *UNUSUAL BRUISING OR BLEEDING  TENDERNESS IN MOUTH AND THROAT WITH OR WITHOUT PRESENCE OF ULCERS  *URINARY PROBLEMS  *BOWEL PROBLEMS  UNUSUAL RASH Items with * indicate a potential emergency and should be followed up as soon as possible.  Feel free to call the clinic should you have any questions or concerns. The clinic phone number is [phone].  Please show the CHEMO ALERT CARD at check-in to the Emergency Department and triage nurse.   

## 2018-09-24 LAB — FOLLICLE STIMULATING HORMONE: FSH: 20.7 m[IU]/mL

## 2018-09-27 LAB — ESTRADIOL, ULTRA SENS: Estradiol, Sensitive: <2.5 pg/mL

## 2018-10-13 ENCOUNTER — Other Ambulatory Visit: Payer: Self-pay | Admitting: *Deleted

## 2018-10-13 DIAGNOSIS — Z170 Estrogen receptor positive status [ER+]: Principal | ICD-10-CM

## 2018-10-13 DIAGNOSIS — C50211 Malignant neoplasm of upper-inner quadrant of right female breast: Secondary | ICD-10-CM

## 2018-10-14 ENCOUNTER — Other Ambulatory Visit: Payer: BC Managed Care – PPO

## 2018-10-14 ENCOUNTER — Inpatient Hospital Stay: Payer: BC Managed Care – PPO

## 2018-10-14 ENCOUNTER — Ambulatory Visit: Payer: BC Managed Care – PPO

## 2018-10-14 ENCOUNTER — Inpatient Hospital Stay: Payer: BC Managed Care – PPO | Attending: Oncology

## 2018-10-14 VITALS — BP 138/88 | HR 76 | Temp 98.4°F | Resp 16

## 2018-10-14 DIAGNOSIS — Z5112 Encounter for antineoplastic immunotherapy: Secondary | ICD-10-CM | POA: Diagnosis not present

## 2018-10-14 DIAGNOSIS — C50211 Malignant neoplasm of upper-inner quadrant of right female breast: Secondary | ICD-10-CM

## 2018-10-14 DIAGNOSIS — Z170 Estrogen receptor positive status [ER+]: Principal | ICD-10-CM

## 2018-10-14 DIAGNOSIS — Z95828 Presence of other vascular implants and grafts: Secondary | ICD-10-CM

## 2018-10-14 DIAGNOSIS — Z79811 Long term (current) use of aromatase inhibitors: Secondary | ICD-10-CM | POA: Insufficient documentation

## 2018-10-14 LAB — CMP (CANCER CENTER ONLY)
ALT: 22 U/L (ref 0–44)
AST: 20 U/L (ref 15–41)
Albumin: 3.9 g/dL (ref 3.5–5.0)
Alkaline Phosphatase: 96 U/L (ref 38–126)
Anion gap: 8 (ref 5–15)
BUN: 13 mg/dL (ref 6–20)
CO2: 27 mmol/L (ref 22–32)
Calcium: 9 mg/dL (ref 8.9–10.3)
Chloride: 106 mmol/L (ref 98–111)
Creatinine: 0.6 mg/dL (ref 0.44–1.00)
GFR, Est AFR Am: >60 mL/min (ref >60)
GFR, Estimated: >60 mL/min (ref >60)
Glucose, Bld: 100 mg/dL — ABNORMAL HIGH (ref 70–99)
POTASSIUM: 4 mmol/L (ref 3.5–5.1)
Sodium: 141 mmol/L (ref 135–145)
Total Bilirubin: 0.5 mg/dL (ref 0.3–1.2)
Total Protein: 6.5 g/dL (ref 6.5–8.1)

## 2018-10-14 LAB — CBC WITH DIFFERENTIAL (CANCER CENTER ONLY)
Abs Immature Granulocytes: 0.02 10*3/uL (ref 0.00–0.07)
Basophils Absolute: 0 10*3/uL (ref 0.0–0.1)
Basophils Relative: 1 %
Eosinophils Absolute: 0.2 10*3/uL (ref 0.0–0.5)
Eosinophils Relative: 5 %
HEMATOCRIT: 33.8 % — AB (ref 36.0–46.0)
HEMOGLOBIN: 11.7 g/dL — AB (ref 12.0–15.0)
Immature Granulocytes: 1 %
LYMPHS PCT: 19 %
Lymphs Abs: 0.8 10*3/uL (ref 0.7–4.0)
MCH: 33.8 pg (ref 26.0–34.0)
MCHC: 34.6 g/dL (ref 30.0–36.0)
MCV: 97.7 fL (ref 80.0–100.0)
Monocytes Absolute: 0.4 10*3/uL (ref 0.1–1.0)
Monocytes Relative: 9 %
NEUTROS ABS: 2.6 10*3/uL (ref 1.7–7.7)
Neutrophils Relative %: 65 %
Platelet Count: 184 10*3/uL (ref 150–400)
RBC: 3.46 MIL/uL — ABNORMAL LOW (ref 3.87–5.11)
RDW: 13 % (ref 11.5–15.5)
WBC Count: 4 10*3/uL (ref 4.0–10.5)
nRBC: 0 % (ref 0.0–0.2)

## 2018-10-14 MED ORDER — SODIUM CHLORIDE 0.9% FLUSH
10.0000 mL | INTRAVENOUS | Status: DC | PRN
  Administered 2018-10-14: 10 mL
  Filled 2018-10-14: qty 10

## 2018-10-14 MED ORDER — ACETAMINOPHEN 325 MG PO TABS
ORAL_TABLET | ORAL | Status: AC
  Filled 2018-10-14: qty 2

## 2018-10-14 MED ORDER — ACETAMINOPHEN 325 MG PO TABS
650.0000 mg | ORAL_TABLET | Freq: Once | ORAL | Status: AC
  Administered 2018-10-14: 650 mg via ORAL

## 2018-10-14 MED ORDER — SODIUM CHLORIDE 0.9% FLUSH
10.0000 mL | Freq: Once | INTRAVENOUS | Status: AC
  Administered 2018-10-14: 10 mL
  Filled 2018-10-14: qty 10

## 2018-10-14 MED ORDER — SODIUM CHLORIDE 0.9 % IV SOLN
Freq: Once | INTRAVENOUS | Status: AC
  Administered 2018-10-14: 15:00:00 via INTRAVENOUS
  Filled 2018-10-14: qty 250

## 2018-10-14 MED ORDER — DIPHENHYDRAMINE HCL 25 MG PO CAPS
25.0000 mg | ORAL_CAPSULE | Freq: Once | ORAL | Status: AC
  Administered 2018-10-14: 25 mg via ORAL

## 2018-10-14 MED ORDER — HEPARIN SOD (PORK) LOCK FLUSH 100 UNIT/ML IV SOLN
500.0000 [IU] | Freq: Once | INTRAVENOUS | Status: AC | PRN
  Administered 2018-10-14: 500 [IU]
  Filled 2018-10-14: qty 5

## 2018-10-14 MED ORDER — TRASTUZUMAB CHEMO 150 MG IV SOLR
6.0000 mg/kg | Freq: Once | INTRAVENOUS | Status: AC
  Administered 2018-10-14: 399 mg via INTRAVENOUS
  Filled 2018-10-14: qty 19

## 2018-10-14 MED ORDER — DIPHENHYDRAMINE HCL 25 MG PO CAPS
ORAL_CAPSULE | ORAL | Status: AC
  Filled 2018-10-14: qty 1

## 2018-10-14 NOTE — Patient Instructions (Signed)
[facility] Discharge Instructions for Patients Receiving Chemotherapy  Today you received the following chemotherapy agents Herceptin  To help prevent nausea and vomiting after your treatment, we encourage you to take your nausea medication as directed   If you develop nausea and vomiting that is not controlled by your nausea medication, call the clinic.   BELOW ARE SYMPTOMS THAT SHOULD BE REPORTED IMMEDIATELY:  *FEVER GREATER THAN 100.5 F  *CHILLS WITH OR WITHOUT FEVER  NAUSEA AND VOMITING THAT IS NOT CONTROLLED WITH YOUR NAUSEA MEDICATION  *UNUSUAL SHORTNESS OF BREATH  *UNUSUAL BRUISING OR BLEEDING  TENDERNESS IN MOUTH AND THROAT WITH OR WITHOUT PRESENCE OF ULCERS  *URINARY PROBLEMS  *BOWEL PROBLEMS  UNUSUAL RASH Items with * indicate a potential emergency and should be followed up as soon as possible.  Feel free to call the clinic should you have any questions or concerns. The clinic phone number is [phone].  Please show the CHEMO ALERT CARD at check-in to the Emergency Department and triage nurse.   

## 2018-10-17 ENCOUNTER — Encounter (HOSPITAL_COMMUNITY): Payer: Self-pay | Admitting: Internal Medicine

## 2018-10-17 ENCOUNTER — Ambulatory Visit (HOSPITAL_BASED_OUTPATIENT_CLINIC_OR_DEPARTMENT_OTHER)
Admission: RE | Admit: 2018-10-17 | Discharge: 2018-10-17 | Disposition: A | Discharge status: Home / Self Care | Payer: BC Managed Care – PPO | Source: Ambulatory Visit | Attending: Internal Medicine | Admitting: Internal Medicine

## 2018-10-17 ENCOUNTER — Ambulatory Visit (HOSPITAL_COMMUNITY)
Admission: RE | Admit: 2018-10-17 | Discharge: 2018-10-17 | Disposition: A | Discharge status: Home / Self Care | Payer: BC Managed Care – PPO | Source: Ambulatory Visit | Attending: Internal Medicine | Admitting: Internal Medicine

## 2018-10-17 VITALS — BP 100/62 | HR 63 | Wt 151.2 lb

## 2018-10-17 DIAGNOSIS — Z79899 Other long term (current) drug therapy: Secondary | ICD-10-CM | POA: Insufficient documentation

## 2018-10-17 DIAGNOSIS — I071 Rheumatic tricuspid insufficiency: Secondary | ICD-10-CM | POA: Diagnosis not present

## 2018-10-17 DIAGNOSIS — C50211 Malignant neoplasm of upper-inner quadrant of right female breast: Secondary | ICD-10-CM | POA: Diagnosis not present

## 2018-10-17 DIAGNOSIS — Z8042 Family history of malignant neoplasm of prostate: Secondary | ICD-10-CM | POA: Diagnosis not present

## 2018-10-17 DIAGNOSIS — Z888 Allergy status to other drugs, medicaments and biological substances status: Secondary | ICD-10-CM | POA: Insufficient documentation

## 2018-10-17 DIAGNOSIS — Z91048 Other nonmedicinal substance allergy status: Secondary | ICD-10-CM | POA: Diagnosis not present

## 2018-10-17 DIAGNOSIS — Z170 Estrogen receptor positive status [ER+]: Secondary | ICD-10-CM

## 2018-10-17 DIAGNOSIS — Z91011 Allergy to milk products: Secondary | ICD-10-CM | POA: Insufficient documentation

## 2018-10-17 DIAGNOSIS — Z8059 Family history of malignant neoplasm of other urinary tract organ: Secondary | ICD-10-CM | POA: Insufficient documentation

## 2018-10-17 DIAGNOSIS — Z8041 Family history of malignant neoplasm of ovary: Secondary | ICD-10-CM | POA: Diagnosis not present

## 2018-10-17 DIAGNOSIS — C50911 Malignant neoplasm of unspecified site of right female breast: Secondary | ICD-10-CM | POA: Diagnosis not present

## 2018-10-17 NOTE — Progress Notes (Signed)
[place]  Referring Physician: Magrinat   HPI:  Hannah Woodard is 48 y.o. female with right breast cancer referred by Dr. Jana Hakim for enrollment into the Cardio-Oncology program.  Biopsy of right breast mass 08/24/2017 showed ([id number]) invasive ductal carcinoma, grade 2, estrogen receptor 95% positive, progesterone receptor 100% positive, both with strong staining intensity, with an MIB-1 of 60%, and HER-2 amplified, with a signals ratio of 2.84 (full report pending).  ONCOLOGY HISTORY  (1) status post right lumpectomy and sentinel axillary lymph node sampling 10/07/2017 at [facility] for a pT1C pN0, stage IA invasive ductal carcinoma, grade 3, with negative margins             (a) 1 sentinel lymph node removed, bilateral oncoplastic breast reduction  (2) adjuvant chemotherapy consisting of paclitaxel weekly x12 and trastuzumab every 21 days STARTING 11/12/2017  (3) continue trastuzumab to complete a year (finished 2/20)  (4) has finished XRT in 6/19  (5) adjuvant antiestrogens to follow at the completion of local treatment  (5) goserelin for fertility preservation started 09/29/2017  She presents today for cardio-oncology follow up. Tolerating Herceptin well. One treatment left. No HF symptoms. Having some issues with neuropathy. Walking her dog and working with a Clinical research associate. Still feels tight from XRT.    Echo today (10/17/18)) EF 60-65% GLS -22.6%  Echo 03/11/18: EF 60-65% GLS -21.0%. LS 10.2 cm/s.  ECHO 3/19: 29-56% normal diastolic function  GLS -21.3% Echo 06/14/18: EF 08-65% normal diastolic function GLS - 78.4% Personally reviewed   Review of systems complete and found to be negative unless listed in HPI.   Past Medical History:  Diagnosis Date  . Breast cancer ([place])   . Family history of ovarian cancer 09/02/2017  . Family history of prostate cancer in father 09/02/2017  . Family history of uterine cancer 09/02/2017  . Plantar fasciitis      Current Outpatient Medications  Medication Sig Dispense Refill  . anastrozole (ARIMIDEX) 1 MG tablet TAKE 1 TABLET BY MOUTH EVERY DAY 90 tablet 2  . Ascorbic Acid (VITAMIN C PO) Take 400 Units by mouth daily.     Marland Kitchen b complex vitamins tablet Take 1 tablet by mouth daily.    . Cholecalciferol (VITAMIN D3) 5000 units TABS Take 10,000 Units by mouth daily.    . fish oil-omega-3 fatty acids 1000 MG capsule Take 1 g by mouth daily.    Marland Kitchen gabapentin (NEURONTIN) 300 MG capsule Take 1 capsule (300 mg total) by mouth at bedtime. 90 capsule 4  . lidocaine-prilocaine (EMLA) cream Apply 1 application topically as needed. 30 g 0  . OVER THE COUNTER MEDICATION Take 1 tablet by mouth at bedtime as needed (constipation).     . TURMERIC PO Take 1 capsule by mouth as directed.     Marland Kitchen UNABLE TO FIND Take 500 mg by mouth daily. [place] mushroom    . UNABLE TO FIND Take 500 mg by mouth daily. [place] Mushroom     No current facility-administered medications for this encounter.     Allergies  Allergen Reactions  . Adhesive [Tape]   . Milk-Related Compounds Diarrhea    Stomach cramping  . Poison Ivy Extract [Poison Ivy Extract] Hives, Itching and Rash      Social History   Socioeconomic History  . Marital status: Married    Spouse name: Not on file  . Number of children: Not on file  . Years of education: Not on file  . Highest education level: Not  on file  Occupational History  . Not on file  Social Needs  . Financial resource strain: Not on file  . Food insecurity:    Worry: Not on file    Inability: Not on file  . Transportation needs:    Medical: Not on file    Non-medical: Not on file  Tobacco Use  . Smoking status: Never Smoker  . Smokeless tobacco: Never Used  Substance and Sexual Activity  . Alcohol use: Yes  . Drug use: No  . Sexual activity: Not on file  Lifestyle  . Physical activity:    Days per week: Not on file    Minutes per session: Not on file  . Stress: Not on  file  Relationships  . Social connections:    Talks on phone: Not on file    Gets together: Not on file    Attends religious service: Not on file    Active member of club or organization: Not on file    Attends meetings of clubs or organizations: Not on file    Relationship status: Not on file  . Intimate partner violence:    Fear of current or ex partner: Not on file    Emotionally abused: Not on file    Physically abused: Not on file    Forced sexual activity: Not on file  Other Topics Concern  . Not on file  Social History Narrative  . Not on file      Family History  Problem Relation Age of Onset  . Ovarian cancer Mother 25  . Prostate cancer Father 18       'high gleason' unsure number  . Ulcerative colitis Sister   . Other Sister        abn ovaian growth, partial hysterectomy  . Uterine cancer Maternal Aunt 38  . Stroke Maternal Aunt 66  . Basal cell carcinoma Brother   . Skin cancer Paternal Uncle   . Skin cancer Maternal Grandmother   . Stroke Paternal Grandfather 24  . Hydrocephalus Cousin     Vitals:   10/17/18 1006  BP: 100/62  Pulse: 63  SpO2: 97%  Weight: 68.6 kg (151 lb 3.2 oz)    PHYSICAL EXAM: General:  Well appearing. No resp difficulty HEENT: normal Neck: supple. no JVD. Carotids 2+ bilat; no bruits. No lymphadenopathy or thryomegaly appreciated. Cor: PMI nondisplaced. Regular rate & rhythm. No rubs, gallops or murmurs. Left port Lungs: clear Abdomen: soft, nontender, nondistended. No hepatosplenomegaly. No bruits or masses. Good bowel sounds. Extremities: no cyanosis, clubbing, rash, edema Neuro: alert & orientedx3, cranial nerves grossly intact. moves all 4 extremities w/o difficulty. Affect pleasant   ASSESSMENT & PLAN: 1.  Right breast cancer - triple positive. S/p lumpectomy & XRT - I reviewed echos personally. EF and Doppler parameters stable. No HF on exam. Continue Herceptin. She will finish up Herceptin in February after next  treatment. Can f/u with [place] PRN.    Glori Bickers, MD  10:27 AM

## 2018-10-17 NOTE — Addendum Note (Signed)
Encounter addended by: Jovita Kussmaul, RN on: 10/17/2018 10:48 AM  Actions taken: Clinical Note Signed

## 2018-10-17 NOTE — Progress Notes (Signed)
  Echocardiogram 2D Echocardiogram has been performed.  Jennette Dubin 10/17/2018, 10:06 AM

## 2018-10-17 NOTE — Patient Instructions (Signed)
Please call us if needed :) CONGRATS!

## 2018-11-03 ENCOUNTER — Other Ambulatory Visit: Payer: Self-pay | Admitting: Adult Health

## 2018-11-03 DIAGNOSIS — Z170 Estrogen receptor positive status [ER+]: Principal | ICD-10-CM

## 2018-11-03 DIAGNOSIS — C50211 Malignant neoplasm of upper-inner quadrant of right female breast: Secondary | ICD-10-CM

## 2018-11-03 DIAGNOSIS — Z8049 Family history of malignant neoplasm of other genital organs: Secondary | ICD-10-CM

## 2018-11-03 DIAGNOSIS — Z8041 Family history of malignant neoplasm of ovary: Secondary | ICD-10-CM

## 2018-11-03 DIAGNOSIS — E2839 Other primary ovarian failure: Secondary | ICD-10-CM

## 2018-11-04 ENCOUNTER — Other Ambulatory Visit: Payer: BC Managed Care – PPO

## 2018-11-04 ENCOUNTER — Inpatient Hospital Stay: Payer: BC Managed Care – PPO | Attending: Oncology

## 2018-11-04 ENCOUNTER — Ambulatory Visit: Payer: BC Managed Care – PPO

## 2018-11-04 ENCOUNTER — Inpatient Hospital Stay: Payer: BC Managed Care – PPO

## 2018-11-04 ENCOUNTER — Encounter: Payer: Self-pay | Admitting: *Deleted

## 2018-11-04 ENCOUNTER — Encounter: Payer: Self-pay | Admitting: Adult Health

## 2018-11-04 ENCOUNTER — Inpatient Hospital Stay: Payer: BC Managed Care – PPO | Admitting: Adult Health

## 2018-11-04 ENCOUNTER — Ambulatory Visit: Payer: BC Managed Care – PPO | Admitting: Adult Health

## 2018-11-04 VITALS — BP 118/83 | HR 68 | Temp 98.4°F | Resp 18 | Ht 66.0 in | Wt 151.4 lb

## 2018-11-04 DIAGNOSIS — Z79811 Long term (current) use of aromatase inhibitors: Secondary | ICD-10-CM

## 2018-11-04 DIAGNOSIS — Z79899 Other long term (current) drug therapy: Secondary | ICD-10-CM | POA: Diagnosis not present

## 2018-11-04 DIAGNOSIS — C50211 Malignant neoplasm of upper-inner quadrant of right female breast: Secondary | ICD-10-CM

## 2018-11-04 DIAGNOSIS — Z8041 Family history of malignant neoplasm of ovary: Secondary | ICD-10-CM

## 2018-11-04 DIAGNOSIS — Z170 Estrogen receptor positive status [ER+]: Secondary | ICD-10-CM | POA: Insufficient documentation

## 2018-11-04 DIAGNOSIS — Z8049 Family history of malignant neoplasm of other genital organs: Secondary | ICD-10-CM

## 2018-11-04 DIAGNOSIS — Z9221 Personal history of antineoplastic chemotherapy: Secondary | ICD-10-CM | POA: Diagnosis not present

## 2018-11-04 DIAGNOSIS — E2839 Other primary ovarian failure: Secondary | ICD-10-CM

## 2018-11-04 DIAGNOSIS — Z95828 Presence of other vascular implants and grafts: Secondary | ICD-10-CM

## 2018-11-04 DIAGNOSIS — Z923 Personal history of irradiation: Secondary | ICD-10-CM | POA: Diagnosis not present

## 2018-11-04 DIAGNOSIS — Z5112 Encounter for antineoplastic immunotherapy: Secondary | ICD-10-CM | POA: Insufficient documentation

## 2018-11-04 LAB — CMP (CANCER CENTER ONLY)
ALT: 22 U/L (ref 0–44)
AST: 22 U/L (ref 15–41)
Albumin: 3.9 g/dL (ref 3.5–5.0)
Alkaline Phosphatase: 101 U/L (ref 38–126)
Anion gap: 8 (ref 5–15)
BUN: 23 mg/dL — AB (ref 6–20)
CO2: 25 mmol/L (ref 22–32)
Calcium: 9.1 mg/dL (ref 8.9–10.3)
Chloride: 106 mmol/L (ref 98–111)
Creatinine: 0.7 mg/dL (ref 0.44–1.00)
GFR, Est AFR Am: >60 mL/min (ref >60)
Glucose, Bld: 99 mg/dL (ref 70–99)
Potassium: 4.3 mmol/L (ref 3.5–5.1)
Sodium: 139 mmol/L (ref 135–145)
Total Bilirubin: 0.7 mg/dL (ref 0.3–1.2)
Total Protein: 6.8 g/dL (ref 6.5–8.1)

## 2018-11-04 LAB — CBC WITH DIFFERENTIAL (CANCER CENTER ONLY)
Abs Immature Granulocytes: 0.01 10*3/uL (ref 0.00–0.07)
Basophils Absolute: 0 10*3/uL (ref 0.0–0.1)
Basophils Relative: 0 %
Eosinophils Absolute: 0.2 10*3/uL (ref 0.0–0.5)
Eosinophils Relative: 5 %
HCT: 36.7 % (ref 36.0–46.0)
Hemoglobin: 12.8 g/dL (ref 12.0–15.0)
Immature Granulocytes: 0 %
Lymphocytes Relative: 16 %
Lymphs Abs: 0.9 10*3/uL (ref 0.7–4.0)
MCH: 34 pg (ref 26.0–34.0)
MCHC: 34.9 g/dL (ref 30.0–36.0)
MCV: 97.6 fL (ref 80.0–100.0)
Monocytes Absolute: 0.4 10*3/uL (ref 0.1–1.0)
Monocytes Relative: 7 %
Neutro Abs: 3.9 10*3/uL (ref 1.7–7.7)
Neutrophils Relative %: 72 %
Platelet Count: 216 10*3/uL (ref 150–400)
RBC: 3.76 MIL/uL — ABNORMAL LOW (ref 3.87–5.11)
RDW: 13.1 % (ref 11.5–15.5)
WBC Count: 5.4 10*3/uL (ref 4.0–10.5)
nRBC: 0 % (ref 0.0–0.2)

## 2018-11-04 MED ORDER — ACETAMINOPHEN 325 MG PO TABS
ORAL_TABLET | ORAL | Status: AC
  Filled 2018-11-04: qty 2

## 2018-11-04 MED ORDER — SODIUM CHLORIDE 0.9% FLUSH
10.0000 mL | Freq: Once | INTRAVENOUS | Status: AC
  Administered 2018-11-04: 10 mL
  Filled 2018-11-04: qty 10

## 2018-11-04 MED ORDER — ACETAMINOPHEN 325 MG PO TABS
650.0000 mg | ORAL_TABLET | Freq: Once | ORAL | Status: AC
  Administered 2018-11-04: 650 mg via ORAL

## 2018-11-04 MED ORDER — GOSERELIN ACETATE 3.6 MG ~~LOC~~ IMPL
3.6000 mg | DRUG_IMPLANT | Freq: Once | SUBCUTANEOUS | Status: AC
  Administered 2018-11-04: 3.6 mg via SUBCUTANEOUS

## 2018-11-04 MED ORDER — SODIUM CHLORIDE 0.9% FLUSH
10.0000 mL | INTRAVENOUS | Status: DC | PRN
  Administered 2018-11-04: 10 mL
  Filled 2018-11-04: qty 10

## 2018-11-04 MED ORDER — DIPHENHYDRAMINE HCL 25 MG PO CAPS
25.0000 mg | ORAL_CAPSULE | Freq: Once | ORAL | Status: AC
  Administered 2018-11-04: 25 mg via ORAL

## 2018-11-04 MED ORDER — DIPHENHYDRAMINE HCL 25 MG PO CAPS
ORAL_CAPSULE | ORAL | Status: AC
  Filled 2018-11-04: qty 1

## 2018-11-04 MED ORDER — SODIUM CHLORIDE 0.9 % IV SOLN
Freq: Once | INTRAVENOUS | Status: AC
  Administered 2018-11-04: 15:00:00 via INTRAVENOUS
  Filled 2018-11-04: qty 250

## 2018-11-04 MED ORDER — HEPARIN SOD (PORK) LOCK FLUSH 100 UNIT/ML IV SOLN
500.0000 [IU] | Freq: Once | INTRAVENOUS | Status: AC | PRN
  Administered 2018-11-04: 500 [IU]
  Filled 2018-11-04: qty 5

## 2018-11-04 MED ORDER — TRASTUZUMAB CHEMO 150 MG IV SOLR
399.0000 mg | Freq: Once | INTRAVENOUS | Status: AC
  Administered 2018-11-04: 399 mg via INTRAVENOUS
  Filled 2018-11-04: qty 19

## 2018-11-04 NOTE — Addendum Note (Signed)
Addended by: Merril Abbe on: 11/04/2018 03:31 PM   Modules accepted: Orders

## 2018-11-04 NOTE — Patient Instructions (Signed)
[facility] Discharge Instructions for Patients Receiving Chemotherapy  Today you received the following chemotherapy agents Herceptin  To help prevent nausea and vomiting after your treatment, we encourage you to take your nausea medication as directed   If you develop nausea and vomiting that is not controlled by your nausea medication, call the clinic.   BELOW ARE SYMPTOMS THAT SHOULD BE REPORTED IMMEDIATELY:  *FEVER GREATER THAN 100.5 F  *CHILLS WITH OR WITHOUT FEVER  NAUSEA AND VOMITING THAT IS NOT CONTROLLED WITH YOUR NAUSEA MEDICATION  *UNUSUAL SHORTNESS OF BREATH  *UNUSUAL BRUISING OR BLEEDING  TENDERNESS IN MOUTH AND THROAT WITH OR WITHOUT PRESENCE OF ULCERS  *URINARY PROBLEMS  *BOWEL PROBLEMS  UNUSUAL RASH Items with * indicate a potential emergency and should be followed up as soon as possible.  Feel free to call the clinic should you have any questions or concerns. The clinic phone number is [phone].  Please show the CHEMO ALERT CARD at check-in to the Emergency Department and triage nurse.   

## 2018-11-04 NOTE — Progress Notes (Signed)
[place]  [phone] [phone]     ID: Hannah Woodard DOB: 1970-10-16  MR#: [id number]  VQQ#:[id number]  Patient Care Team: Unk Pinto, MD as PCP - General (Internal Medicine) Jovita Kussmaul, MD as Consulting Physician (General Surgery) Magrinat, Virgie Dad, MD as Consulting Physician (Oncology) Kyung Rudd, MD as Consulting Physician (Radiation Oncology) Lorelle Gibbs, MD (Radiology) Howard-McNatt, Mable Fill, MD as Referring Physician (Surgery) OTHER MD:  CHIEF COMPLAINT: Triple positive breast cancer  CURRENT TREATMENT: Trastuzumab, anastrozole  INTERVAL HISTORY: Hannah Woodard returns today for follow-up of her triple positive breast cancer.   The patient continues on trastuzumab, which she is tolerating well.   The patient also continues on anastrozole, which she is tolerating well.   She stopped receiving the Goserelin injections, with her last injection being on 08/05/2018.  Her menses have resumed, so she will restart these injections.  Since her last visit she underwent bone density testing that demonstrated a T score of -0.7 (normal).  She also underwent an echocardiogram on 10/17/2018 that shows a well preserved ejection fraction of 60-65%.  Hannah Woodard has also been having mammograms with her surgeon at [facility].  Her next one is due in 11/2018.   REVIEW OF SYSTEMS: Hannah Woodard is feeling well today.  She notes that she has had some abdominal cramping and breast tenderness that she noted when she got her menstrual cycles back. Other than that she feels well.  She denies any issues today such as vision changes, unusual headaches, fatigue, nausea, vomiting, constipation, diarrhea, bowel/bladder changes, or any other concerns.  A detailed ROS was otherwise non contributory.    HISTORY OF CURRENT ILLNESS: From the original intake note:  The patient has a history of cysts and nodules in the right breast which have been closely followed, with  exams 07/30/2016 and 01/28/2017.  On 07/29/2017 follow-up right breast ultrasonography showed no findings of concern.  Bilateral diagnostic mammography 08/18/2017 with right breast ultrasonography on 08/18/2017 at [facility] found the breast density to be category C.  There was now a new irregular high density mass in the posterior portion of the right breast seen on the mediolateral oblique view only.  Ultrasonography confirmed a 1.4 cm lobulated mass in the upper inner quadrant of the right breast 10 cm from the nipple associated with pectoral muscle invasion.  The right axilla was sonographically benign.  Biopsy of this mass 08/24/2017 showed ([id number]) invasive ductal carcinoma, grade 2, estrogen receptor 95% positive, progesterone receptor 100% positive, both with strong staining intensity, with an MIB-1 of 60%, and HER-2 amplified, with a signals ratio of 2.84 (full report pending).  The patient's subsequent history is as detailed below.  PAST MEDICAL HISTORY: Past Medical History:  Diagnosis Date  . Breast cancer ([place])   . Family history of ovarian cancer 09/02/2017  . Family history of prostate cancer in father 09/02/2017  . Family history of uterine cancer 09/02/2017  . Plantar fasciitis     PAST SURGICAL HISTORY: Past Surgical History:  Procedure Laterality Date  . right lumpectomy, sentinel node biopsy, and bilateral mammoplasty  10/2017   [facility]    FAMILY HISTORY Family History  Problem Relation Age of Onset  . Ovarian cancer Mother 59  . Prostate cancer Father 56       'high gleason' unsure number  . Ulcerative colitis Sister   . Other Sister        abn ovaian growth, partial hysterectomy  . Uterine cancer Maternal Aunt 79  .  Stroke Maternal Aunt 66  . Basal cell carcinoma Brother   . Skin cancer Paternal Uncle   . Skin cancer Maternal Grandmother   . Stroke Paternal Grandfather 29  . Hydrocephalus Cousin   The patient's father died from alcoholic cirrhosis at age 56.   He had been diagnosed with prostate cancer at age 47.  The patient's mother was diagnosed with ovarian cancer at age 18 and died a year later.  The patient has 1 brother, 1 sister.  The patient's brother has had basal cell and other skin cancers.  A paternal aunt had uterine cancer.  Other relatives have had skin cancers but the patient does not know if these were melanomas or not  GYNECOLOGIC HISTORY:  No LMP recorded. (Menstrual status: Chemotherapy).  Menarche age 69, the patient has never been pregnant.  She is still having regular periods.  She used oral contraceptives briefly in the past without complications. She and her husband are very interested in having children, and currently (December 2018) they are not using any contraception.   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  Her husband Rodman Key (goes by [employer]") is a Dealer.  At home it's just them, a [employer] and 2 cats.    ADVANCED DIRECTIVES: Not in place   HEALTH MAINTENANCE: Social History   Tobacco Use  . Smoking status: Never Smoker  . Smokeless tobacco: Never Used  Substance Use Topics  . Alcohol use: Yes  . Drug use: No     Colonoscopy: Never  PAP:  Bone density: Never   Allergies  Allergen Reactions  . Adhesive [Tape]   . Milk-Related Compounds Diarrhea    Stomach cramping  . Poison Ivy Extract [Poison Ivy Extract] Hives, Itching and Rash    Current Outpatient Medications  Medication Sig Dispense Refill  . anastrozole (ARIMIDEX) 1 MG tablet TAKE 1 TABLET BY MOUTH EVERY DAY 90 tablet 2  . Ascorbic Acid (VITAMIN C PO) Take 400 Units by mouth daily.     Marland Kitchen b complex vitamins tablet Take 1 tablet by mouth daily.    . Cholecalciferol (VITAMIN D3) 5000 units TABS Take 10,000 Units by mouth daily.    . fish oil-omega-3 fatty acids 1000 MG capsule Take 1 g by mouth daily.    Marland Kitchen gabapentin (NEURONTIN) 300 MG capsule Take 1 capsule  (300 mg total) by mouth at bedtime. 90 capsule 4  . lidocaine-prilocaine (EMLA) cream Apply 1 application topically as needed. 30 g 0  . OVER THE COUNTER MEDICATION Take 1 tablet by mouth at bedtime as needed (constipation).     . TURMERIC PO Take 1 capsule by mouth as directed.     Marland Kitchen UNABLE TO FIND Take 500 mg by mouth daily. [place] mushroom    . UNABLE TO FIND Take 500 mg by mouth daily. [place] Mushroom     No current facility-administered medications for this visit.    Facility-Administered Medications Ordered in Other Visits  Medication Dose Route Frequency Provider Last Rate Last Dose  . sodium chloride flush (NS) 0.9 % injection 10 mL  10 mL Intracatheter PRN Magrinat, Virgie Dad, MD   10 mL at 11/04/18 1639    OBJECTIVE:  Vitals:   11/04/18 1427  BP: 118/83  Pulse: 68  Resp: 18  Temp: 98.4 F (36.9 C)  SpO2: 100%     Body mass index is 24.44 kg/m.   Wt Readings from Last 3  Encounters:  11/04/18 151 lb 6.4 oz (68.7 kg)  10/17/18 151 lb 3.2 oz (68.6 kg)  09/02/18 146 lb 8 oz (66.5 kg)   ECOG FS:1 - Symptomatic but completely ambulatory GENERAL: Patient is a well appearing female in no acute distress HEENT:  Sclerae anicteric.  Oropharynx clear and moist. No ulcerations or evidence of oropharyngeal candidiasis. Neck is supple.  NODES:  No cervical, supraclavicular, or axillary lymphadenopathy palpated.  BREAST EXAM:  Right breast s/p lumpectomy and reduction, no sign of local recurrence, left breast s/p reduction, benign LUNGS:  Clear to auscultation bilaterally.  No wheezes or rhonchi. HEART:  Regular rate and rhythm. No murmur appreciated. ABDOMEN:  Soft, nontender.  Positive, normoactive bowel sounds. No organomegaly palpated. MSK:  No focal spinal tenderness to palpation. Full range of motion bilaterally in the upper extremities. EXTREMITIES:  No peripheral edema.   SKIN:  Clear with no obvious rashes or skin changes. No nail dyscrasia. NEURO:  Nonfocal. Well  oriented.  Appropriate affect.     LAB RESULTS:  CMP     Component Value Date/Time   NA 139 11/04/2018 1416   NA 138 09/01/2017 1238   K 4.3 11/04/2018 1416   K 4.4 09/01/2017 1238   CL 106 11/04/2018 1416   CO2 25 11/04/2018 1416   CO2 25 09/01/2017 1238   GLUCOSE 99 11/04/2018 1416   GLUCOSE 83 09/01/2017 1238   BUN 23 (H) 11/04/2018 1416   BUN 9.2 09/01/2017 1238   CREATININE 0.70 11/04/2018 1416   CREATININE 0.7 09/01/2017 1238   CALCIUM 9.1 11/04/2018 1416   CALCIUM 9.4 09/01/2017 1238   PROT 6.8 11/04/2018 1416   PROT 7.4 09/01/2017 1238   ALBUMIN 3.9 11/04/2018 1416   ALBUMIN 4.2 09/01/2017 1238   AST 22 11/04/2018 1416   AST 21 09/01/2017 1238   ALT 22 11/04/2018 1416   ALT 21 09/01/2017 1238   ALKPHOS 101 11/04/2018 1416   ALKPHOS 60 09/01/2017 1238   BILITOT 0.7 11/04/2018 1416   BILITOT 0.65 09/01/2017 1238   GFRNONAA >60 11/04/2018 1416   GFRNONAA 111 05/18/2017 0928   GFRAA >60 11/04/2018 1416   GFRAA 128 05/18/2017 0928    No results found for: Ronnald Ramp, A1GS, A2GS, BETS, BETA2SER, GAMS, MSPIKE, SPEI  No results found for: KPAFRELGTCHN, LAMBDASER, [facility]  Lab Results  Component Value Date   WBC 5.4 11/04/2018   NEUTROABS 3.9 11/04/2018   HGB 12.8 11/04/2018   HCT 36.7 11/04/2018   MCV 97.6 11/04/2018   PLT 216 11/04/2018      Chemistry      Component Value Date/Time   NA 139 11/04/2018 1416   NA 138 09/01/2017 1238   K 4.3 11/04/2018 1416   K 4.4 09/01/2017 1238   CL 106 11/04/2018 1416   CO2 25 11/04/2018 1416   CO2 25 09/01/2017 1238   BUN 23 (H) 11/04/2018 1416   BUN 9.2 09/01/2017 1238   CREATININE 0.70 11/04/2018 1416   CREATININE 0.7 09/01/2017 1238      Component Value Date/Time   CALCIUM 9.1 11/04/2018 1416   CALCIUM 9.4 09/01/2017 1238   ALKPHOS 101 11/04/2018 1416   ALKPHOS 60 09/01/2017 1238   AST 22 11/04/2018 1416   AST 21 09/01/2017 1238   ALT 22 11/04/2018 1416   ALT 21 09/01/2017 1238     BILITOT 0.7 11/04/2018 1416   BILITOT 0.65 09/01/2017 1238       No results found for: LABCA2  No components  found for: [id number]  No results for input(s): INR in the last 168 hours.  No results found for: LABCA2  No results found for: NWG956  No results found for: OZH086  No results found for: VHQ469  No results found for: CA2729  No components found for: HGQUANT  No results found for: CEA1 / No results found for: CEA1   No results found for: AFPTUMOR  No results found for: CHROMOGRNA  No results found for: PSA1  Appointment on 11/04/2018  Component Date Value Ref Range Status  . Sodium 11/04/2018 139  135 - 145 mmol/L Final  . Potassium 11/04/2018 4.3  3.5 - 5.1 mmol/L Final  . Chloride 11/04/2018 106  98 - 111 mmol/L Final  . CO2 11/04/2018 25  22 - 32 mmol/L Final  . Glucose, Bld 11/04/2018 99  70 - 99 mg/dL Final  . BUN 11/04/2018 23* 6 - 20 mg/dL Final  . Creatinine 11/04/2018 0.70  0.44 - 1.00 mg/dL Final  . Calcium 11/04/2018 9.1  8.9 - 10.3 mg/dL Final  . Total Protein 11/04/2018 6.8  6.5 - 8.1 g/dL Final  . Albumin 11/04/2018 3.9  3.5 - 5.0 g/dL Final  . AST 11/04/2018 22  15 - 41 U/L Final  . ALT 11/04/2018 22  0 - 44 U/L Final  . Alkaline Phosphatase 11/04/2018 101  38 - 126 U/L Final  . Total Bilirubin 11/04/2018 0.7  0.3 - 1.2 mg/dL Final  . GFR, Est Non Af Am 11/04/2018 >60  >60 mL/min Final  . GFR, Est AFR Am 11/04/2018 >60  >60 mL/min Final  . Anion gap 11/04/2018 8  5 - 15 Final   Performed at [facility], [place] [address] [place], [place] [address]  . WBC Count 11/04/2018 5.4  4.0 - 10.5 K/uL Final  . RBC 11/04/2018 3.76* 3.87 - 5.11 MIL/uL Final  . Hemoglobin 11/04/2018 12.8  12.0 - 15.0 g/dL Final  . HCT 11/04/2018 36.7  36.0 - 46.0 % Final  . MCV 11/04/2018 97.6  80.0 - 100.0 fL Final  . MCH 11/04/2018 34.0  26.0 - 34.0 pg Final  . MCHC 11/04/2018 34.9  30.0 - 36.0 g/dL Final  . RDW 11/04/2018 13.1  11.5 -  15.5 % Final  . Platelet Count 11/04/2018 216  150 - 400 K/uL Final  . nRBC 11/04/2018 0.0  0.0 - 0.2 % Final  . Neutrophils Relative % 11/04/2018 72  % Final  . Neutro Abs 11/04/2018 3.9  1.7 - 7.7 K/uL Final  . Lymphocytes Relative 11/04/2018 16  % Final  . Lymphs Abs 11/04/2018 0.9  0.7 - 4.0 K/uL Final  . Monocytes Relative 11/04/2018 7  % Final  . Monocytes Absolute 11/04/2018 0.4  0.1 - 1.0 K/uL Final  . Eosinophils Relative 11/04/2018 5  % Final  . Eosinophils Absolute 11/04/2018 0.2  0.0 - 0.5 K/uL Final  . Basophils Relative 11/04/2018 0  % Final  . Basophils Absolute 11/04/2018 0.0  0.0 - 0.1 K/uL Final  . Immature Granulocytes 11/04/2018 0  % Final  . Abs Immature Granulocytes 11/04/2018 0.01  0.00 - 0.07 K/uL Final   Performed at [facility], [place] [address] [place], [place] [address]    (this displays the last labs from the last 3 days)  No results found for: TOTALPROTELP, ALBUMINELP, A1GS, A2GS, BETS, BETA2SER, GAMS, MSPIKE, SPEI (this displays SPEP labs)  No results found for: KPAFRELGTCHN, LAMBDASER, KAPLAMBRATIO (kappa/lambda light chains)  No results found for: HGBA, HGBA2QUANT, HGBFQUANT, HGBSQUAN (Hemoglobinopathy evaluation)   No results found for: LDH  Lab Results  Component Value Date   IRON 132 12/21/2017   TIBC 305 12/21/2017   IRONPCTSAT 43 12/21/2017   (Iron and TIBC)  Lab Results  Component Value Date   FERRITIN 67 12/16/2016    Urinalysis    Component Value Date/Time   COLORURINE AMBER (A) 12/27/2017 1905   APPEARANCEUR HAZY (A) 12/27/2017 1905   LABSPEC 1.005 12/27/2017 1905   PHURINE 7.0 12/27/2017 1905   GLUCOSEU NEGATIVE 12/27/2017 1905   HGBUR NEGATIVE 12/27/2017 1905   BILIRUBINUR NEGATIVE 12/27/2017 1905   KETONESUR NEGATIVE 12/27/2017 1905   PROTEINUR NEGATIVE 12/27/2017 1905   UROBILINOGEN 0.2 12/06/2014 1529   NITRITE NEGATIVE 12/27/2017 1905   LEUKOCYTESUR TRACE (A) 12/27/2017 1905     STUDIES: Echo results reviewed  ELIGIBLE FOR AVAILABLE RESEARCH PROTOCOL: No  ASSESSMENT: 48 y.o. Admire woman status post right breast upper inner quadrant biopsy 08/24/2017 for a clinical T1c N0 invasive ductal carcinoma, triple positive, with an MIB-1 of 60%  (1) genetics testing 09/10/2017 through the Common Hereditary Cancer Panel + Melanoma Panel found no deleterious mutations in: APC, ATM, AXIN2, BAP1, BARD1, BMPR1A, BRCA1, BRCA2, BRIP1, CDH1, CDK4, CDKN2A (p14ARF), CDKN2A (p16INK4a), CHEK2, CTNNA1, DICER1, EPCAM*, GREM1*, KIT, MEN1, MLH1, MSH2, MSH3, MSH6, MUTYH, NBN, NF1, PALB2, PDGFRA, PMS2, POLD1, POLE, POT1, PTEN, RAD50, RAD51C, RAD51D, RB1, SDHB, SDHC, SDHD, SMAD4, SMARCA4, STK11, TP53, TSC1, TSC2, VHL. 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  (a) baseline echocardiogram 09/10/2017 shows an ejection fraction of 55-60%  (b) echocardiogram 12/09/2017 showed an ejection fraction in the 60-65% range  (c) echo in 06/14/2018 shows EF of 60-65%  (d) echo on 10/17/2018 shows EF of 60-65%  (5) adjuvant radiation05/05/2018 - 03/18/2018 Site/dose:   The patient initially received a dose of 50.4 Gy in 28 fractions to the breast using whole-breast tangent fields. This was delivered using a 3-D conformal technique. The patient then received a boost to the seroma. This delivered an additional 10 Gy in 5 fractions using  9e electrons with a special teletherapy technique. The total dose was 60.4 Gy.   (6) anastrozole started 03/28/2018  (a) bone density 09/05/2018--normal  (b) patient refuses tamoxifen because of family history of strokes and uterine cancer  (7) goserelin started 09/29/2017, discontinued after 08/05/2018 dose, restarted on 11/04/2018 when her menses resumed  PLAN: Hannah Woodard is doing well today.  She has no clinical or radiographic sign of recurrence.  She will complete Trastuzumab today.  I congratulated her.  Hannah Woodard will continue to follow with her surgeon for her mammograms and MRIs and these are visible in care everywhere.    Hannah Woodard and I reviewed that since she has restarted her cycles, she will resume Zoladex every 4 weeks.  I placed orders for her Zoladex yesterday.  She would like for Porsche to administer the injection.    Hannah Woodard will return in 4 weeks for labs and evaluation  with Dr. Jana Hakim.  She knows to call for any other issues that may develop before the next visit.  A total of (30) minutes of face-to-face time was spent with this patient with greater than 50% of that time in counseling and care-coordination.   Wilber Bihari, NP  11/04/18 7:00 PM Medical Oncology and Hematology [facility] [address] [place], [place] [address] Tel. [phone]    Fax. [phone]

## 2018-11-07 ENCOUNTER — Telehealth: Payer: Self-pay | Admitting: Adult Health

## 2018-11-07 NOTE — Telephone Encounter (Signed)
No los

## 2018-11-10 ENCOUNTER — Encounter: Payer: Self-pay | Admitting: Adult Health

## 2018-11-10 ENCOUNTER — Ambulatory Visit: Payer: BC Managed Care – PPO | Admitting: Adult Health

## 2018-11-10 VITALS — BP 110/64 | HR 94 | Temp 97.5°F | Ht 66.0 in | Wt 153.0 lb

## 2018-11-10 DIAGNOSIS — J029 Acute pharyngitis, unspecified: Secondary | ICD-10-CM | POA: Diagnosis not present

## 2018-11-10 DIAGNOSIS — J00 Acute nasopharyngitis [common cold]: Secondary | ICD-10-CM

## 2018-11-10 LAB — POCT RAPID STREP A (OFFICE): Rapid Strep A Screen: NEGATIVE

## 2018-11-10 MED ORDER — AZITHROMYCIN 250 MG PO TABS: ORAL_TABLET | ORAL | 1 refills | Status: AC

## 2018-11-10 NOTE — Patient Instructions (Signed)
HOW TO TREAT VIRAL COUGH AND COLD SYMPTOMS:  -Symptoms usually last at least 1 week with the worst symptoms being around day 4.  - colds usually start with a sore throat and end with a cough, and the cough can take 2 weeks to get better.  -No antibiotics are needed for colds, flu, sore throats, cough, bronchitis UNLESS symptoms are longer than 7 days OR if you are getting better then get drastically worse.  -There are a lot of combination medications (Dayquil, Nyquil, Vicks 44, tyelnol cold and sinus, ETC). Please look at the ingredients on the back so that you are treating the correct symptoms and not doubling up on medications/ingredients.    Medicines you can use  Nasal congestion  Little Remedies saline spray (aerosol/mist)- can try this, it is in the kids section - pseudoephedrine (Sudafed)- behind the counter, do not use if you have high blood pressure, medicine that have -D in them.  - phenylephrine (Sudafed PE) -Dextormethorphan + chlorpheniramine (Coridcidin HBP)- okay if you have high blood pressure -Oxymetazoline (Afrin) nasal spray- LIMIT to 3 days -Saline nasal spray -Neti pot (used distilled or bottled water)  Ear pain/congestion  -pseudoephedrine (sudafed) - Nasonex/flonase nasal spray  Fever  -Acetaminophen (Tyelnol) -Ibuprofen (Advil, motrin, aleve)  Sore Throat  -Acetaminophen (Tyelnol) -Ibuprofen (Advil, motrin, aleve) -Drink a lot of water -Gargle with salt water - Rest your voice (don't talk) -Throat sprays -Cough drops  Body Aches  -Acetaminophen (Tyelnol) -Ibuprofen (Advil, motrin, aleve)  Headache  -Acetaminophen (Tyelnol) -Ibuprofen (Advil, motrin, aleve) - Exedrin, Exedrin Migraine  Allergy symptoms (cough, sneeze, runny nose, itchy eyes) -Claritin or loratadine cheapest but likely the weakest  -Zyrtec or certizine at night because it can make you sleepy -The strongest is allegra or fexafinadine  Cheapest at [employer], [employer],  [employer]  Cough  -Dextromethorphan (Delsym)- medicine that has DM in it -Guafenesin (Mucinex/Robitussin) - cough drops - drink lots of water  Chest Congestion  -Guafenesin (Mucinex/Robitussin)  Red Itchy Eyes  - Naphcon-A  Upset Stomach  - Bland diet (nothing spicy, greasy, fried, and high acid foods like tomatoes, oranges, berries) -OKAY- cereal, bread, soup, crackers, rice -Eat smaller more frequent meals -reduce caffeine, no alcohol -Loperamide (Imodium-AD) if diarrhea -Prevacid for heart burn  General health when sick  -Hydration -wash your hands frequently -keep surfaces clean -change pillow cases and sheets often -Get fresh air but do not exercise strenuously -Vitamin D, double up on it - Vitamin C -Zinc

## 2018-11-10 NOTE — Progress Notes (Signed)
Assessment and Plan:  Hannah Woodard was seen today for acute visit.  Diagnoses and all orders for this visit:  Acute nasopharyngitis Discussed the importance of avoiding unnecessary antibiotic therapy, expect ~7 day duration for viral URI; zpak printed to hold over the weekend should she start to improve then symptoms become much severe.  STOP high dose aspirin, take tylenol or low dose aspirin PRN pain/achiness Salt water gargles for sore throat She declines prescription cough medicaiton; recommended robitussin or delsym Suggested symptomatic OTC remedies. Nasal saline spray for congestion. Follow up as needed. ER for fever uncontrolled above 101 despite tylenol/ibuprofen, or with severe pain or dyspnea  Sore throat Reassured patient as unlikely strep, per strong patient preference will r/o anyway  -     POCT rapid strep A  Other orders -     azithromycin (ZITHROMAX) 250 MG tablet; Take 2 tablets (500 mg) on  Day 1,  followed by 1 tablet (250 mg) once daily on Days 2 through 5.  Further disposition pending results of labs. Discussed med's effects and SE's.   Over 15 minutes of exam, counseling, chart review, and critical decision making was performed.   Future Appointments  Date Time Provider Craig  12/07/2018  3:15 PM [facility] 5 [facility] None  12/07/2018  3:30 PM [facility] [place] None  12/07/2018  3:45 PM [facility] [place] [facility] None  12/14/2018  3:30 PM Magrinat, Virgie Dad, MD [facility] None  12/27/2018  2:00 PM Vicie Mutters, PA-C [facility] None  01/04/2019  3:45 PM [facility] [place] [facility] None  02/01/2019  3:45 PM [facility] [place] [facility] None  03/01/2019  3:45 PM [facility] [place] FL[facility] None  03/07/2019  2:00 PM Causey, Charlestine Massed, NP [facility] None    ------------------------------------------------------------------------------------------------------------------   HPI BP 110/64   Pulse 94   Temp (!) 97.5 F  (36.4 C)   Ht 5' 6"  (1.676 m)   Wt 153 lb (69.4 kg)   LMP 10/05/2017   SpO2 95%   BMI 24.69 kg/m   48 y.o.female with R breast cancer, s/p chemo, undergoing herceptin presents for evaluation of URI x 5 days; she reports this began with nasal congestion, running nose, post-nasal drip, has developed a mild sore throat, and started having primarily non-productive cough, though has a sense of chest congestion. She has not noted any fever/chills. She does have some sense of mild fatigue.  She has been taking mucinex, pushing water intake, taking elderberry extract, taking 3 325 mg aspirin at night (advised to stop this).   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 Denies hx of allergies.   Past Medical History:  Diagnosis Date  . Breast cancer ([place])   . Family history of ovarian cancer 09/02/2017  . Family history of prostate cancer in father 09/02/2017  . Family history of uterine cancer 09/02/2017  . Plantar fasciitis      Allergies  Allergen Reactions  . Adhesive [Tape]   . Milk-Related Compounds Diarrhea    Stomach cramping  . Poison Ivy Extract [Poison Ivy Extract] Hives, Itching and Rash    Current Outpatient Medications on File Prior to Visit  Medication Sig  . anastrozole (ARIMIDEX) 1 MG tablet TAKE 1 TABLET BY MOUTH EVERY DAY  . Ascorbic Acid (VITAMIN C PO) Take 400 Units by mouth daily.   Marland Kitchen b complex vitamins tablet Take 1 tablet by mouth daily.  Marland Kitchen  Cholecalciferol (VITAMIN D3) 5000 units TABS Take 10,000 Units by mouth daily.  . fish oil-omega-3 fatty acids 1000 MG capsule Take 1 g by mouth daily.  Marland Kitchen gabapentin (NEURONTIN) 300 MG capsule Take 1 capsule (300 mg total) by mouth at bedtime.  . lidocaine-prilocaine (EMLA) cream Apply 1 application topically as needed.  Marland Kitchen OVER THE COUNTER MEDICATION Take 1 tablet by mouth at bedtime as needed (constipation).   . TURMERIC PO Take 1  capsule by mouth as directed.   Marland Kitchen UNABLE TO FIND Take 500 mg by mouth daily. [place] Tail mushroom  . UNABLE TO FIND Take 500 mg by mouth daily. [place] Mushroom  . [DISCONTINUED] prochlorperazine (COMPAZINE) 10 MG tablet Take 1 tablet (10 mg total) by mouth every 6 (six) hours as needed (Nausea or vomiting).   No current facility-administered medications on file prior to visit.     ROS: all negative except above.   Physical Exam:  BP 110/64   Pulse 94   Temp (!) 97.5 F (36.4 C)   Ht 5' 6"  (1.676 m)   Wt 153 lb (69.4 kg)   LMP 10/05/2017   SpO2 95%   BMI 24.69 kg/m   General Appearance: Well nourished, in no apparent distress. Eyes: PERRLA, conjunctiva no swelling or erythema Sinuses: No Frontal/maxillary tenderness ENT/Mouth: Ext aud canals clear, TMs without erythema, bulging. Post pharynx mildly erythematous, without notable swelling, or exudate..  Tonsils not swollen or erythematous. Hearing normal.  Neck: Supple, thyroid normal.  Respiratory: Respiratory effort normal, BS equal bilaterally without rales, rhonchi, wheezing or stridor. Occasional hacky dry cough.  Cardio: RRR with no MRGs. Brisk peripheral pulses without edema.  Abdomen: Soft, + BS.  Non tender. Lymphatics: Non tender without lymphadenopathy.  Musculoskeletal: normal gait.  Skin: Warm, dry without rashes, lesions, ecchymosis.  Neuro: Cranial nerves intact.  Psych: Awake and oriented X 3, normal affect, Insight and Judgment appropriate.     Izora Ribas, NP 4:08 PM [facility]

## 2018-12-07 ENCOUNTER — Other Ambulatory Visit: Payer: BC Managed Care – PPO

## 2018-12-07 ENCOUNTER — Other Ambulatory Visit: Payer: Self-pay

## 2018-12-07 ENCOUNTER — Inpatient Hospital Stay: Payer: BC Managed Care – PPO | Attending: Oncology

## 2018-12-07 DIAGNOSIS — Z170 Estrogen receptor positive status [ER+]: Secondary | ICD-10-CM | POA: Insufficient documentation

## 2018-12-07 DIAGNOSIS — Z79818 Long term (current) use of other agents affecting estrogen receptors and estrogen levels: Secondary | ICD-10-CM | POA: Diagnosis not present

## 2018-12-07 DIAGNOSIS — Z9221 Personal history of antineoplastic chemotherapy: Secondary | ICD-10-CM | POA: Diagnosis not present

## 2018-12-07 DIAGNOSIS — C50211 Malignant neoplasm of upper-inner quadrant of right female breast: Secondary | ICD-10-CM | POA: Diagnosis present

## 2018-12-07 DIAGNOSIS — Z923 Personal history of irradiation: Secondary | ICD-10-CM | POA: Insufficient documentation

## 2018-12-07 DIAGNOSIS — Z95828 Presence of other vascular implants and grafts: Secondary | ICD-10-CM

## 2018-12-07 MED ORDER — GOSERELIN ACETATE 3.6 MG ~~LOC~~ IMPL
3.6000 mg | DRUG_IMPLANT | Freq: Once | SUBCUTANEOUS | Status: AC
  Administered 2018-12-07: 3.6 mg via SUBCUTANEOUS

## 2018-12-07 MED ORDER — GOSERELIN ACETATE 3.6 MG ~~LOC~~ IMPL
DRUG_IMPLANT | SUBCUTANEOUS | Status: AC
  Filled 2018-12-07: qty 3.6

## 2018-12-07 NOTE — Patient Instructions (Signed)
Goserelin injection What is this medicine? GOSERELIN (GOE se rel in) is similar to a hormone found in the body. It lowers the amount of sex hormones that the body makes. Men will have lower testosterone levels and women will have lower estrogen levels while taking this medicine. In men, this medicine is used to treat prostate cancer; the injection is either given once per month or once every 12 weeks. A once per month injection (only) is used to treat women with endometriosis, dysfunctional uterine bleeding, or advanced breast cancer. This medicine may be used for other purposes; ask your health care provider or pharmacist if you have questions. COMMON BRAND NAME(S): Zoladex What should I tell my health care provider before I take this medicine? They need to know if you have any of these conditions (some only apply to women): -diabetes -heart disease or previous heart attack -high blood pressure -high cholesterol -kidney disease -osteoporosis or low bone density -problems passing urine -spinal cord injury -stroke -tobacco smoker -an unusual or allergic reaction to goserelin, hormone therapy, other medicines, foods, dyes, or preservatives -pregnant or trying to get pregnant -breast-feeding How should I use this medicine? This medicine is for injection under the skin. It is given by a health care professional in a hospital or clinic setting. Men receive this injection once every 4 weeks or once every 12 weeks. Women will only receive the once every 4 weeks injection. Talk to your pediatrician regarding the use of this medicine in children. Special care may be needed. Overdosage: If you think you have taken too much of this medicine contact a poison control center or emergency room at once. NOTE: This medicine is only for you. Do not share this medicine with others. What if I miss a dose? It is important not to miss your dose. Call your doctor or health care professional if you are unable to  keep an appointment. What may interact with this medicine? -female hormones like estrogen -herbal or dietary supplements like black cohosh, chasteberry, or DHEA -female hormones like testosterone -prasterone This list may not describe all possible interactions. Give your health care provider a list of all the medicines, herbs, non-prescription drugs, or dietary supplements you use. Also tell them if you smoke, drink alcohol, or use illegal drugs. Some items may interact with your medicine. What should I watch for while using this medicine? Visit your doctor or health care professional for regular checks on your progress. Your symptoms may appear to get worse during the first weeks of this therapy. Tell your doctor or healthcare professional if your symptoms do not start to get better or if they get worse after this time. Your bones may get weaker if you take this medicine for a long time. If you smoke or frequently drink alcohol you may increase your risk of bone loss. A family history of osteoporosis, chronic use of drugs for seizures (convulsions), or corticosteroids can also increase your risk of bone loss. Talk to your doctor about how to keep your bones strong. This medicine should stop regular monthly menstration in women. Tell your doctor if you continue to [facility]. Women should not become pregnant while taking this medicine or for 12 weeks after stopping this medicine. Women should inform their doctor if they wish to become pregnant or think they might be pregnant. There is a potential for serious side effects to an unborn child. Talk to your health care professional or pharmacist for more information. Do not breast-feed an infant while taking  this medicine. Men should inform their doctors if they wish to father a child. This medicine may lower sperm counts. Talk to your health care professional or pharmacist for more information. What side effects may I notice from receiving this  medicine? Side effects that you should report to your doctor or health care professional as soon as possible: -allergic reactions like skin rash, itching or hives, swelling of the face, lips, or tongue -bone pain -breathing problems -changes in vision -chest pain -feeling faint or lightheaded, falls -fever, chills -pain, swelling, warmth in the leg -pain, tingling, numbness in the hands or feet -signs and symptoms of low blood pressure like dizziness; feeling faint or lightheaded, falls; unusually weak or tired -stomach pain -swelling of the ankles, feet, hands -trouble passing urine or change in the amount of urine -unusually high or low blood pressure -unusually weak or tired Side effects that usually do not require medical attention (report to your doctor or health care professional if they continue or are bothersome): -change in sex drive or performance -changes in breast size in both males and females -changes in emotions or moods -headache -hot flashes -irritation at site where injected -loss of appetite -skin problems like acne, dry skin -vaginal dryness This list may not describe all possible side effects. Call your doctor for medical advice about side effects. You may report side effects to FDA at 1-800-FDA-1088. Where should I keep my medicine? This drug is given in a hospital or clinic and will not be stored at home. NOTE: This sheet is a summary. It may not cover all possible information. If you have questions about this medicine, talk to your doctor, pharmacist, or health care provider.  2018 Elsevier/Gold Standard (2013-11-21 11:10:35)

## 2018-12-14 ENCOUNTER — Ambulatory Visit: Payer: BC Managed Care – PPO | Admitting: Oncology

## 2018-12-26 NOTE — Progress Notes (Signed)
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[facility]) -     TSH  Vitamin D deficiency -     VITAMIN D 25 Hydroxy (Vit-D Deficiency, Fractures)  Dense breast tissue Continue follow up oncology  Screening for diabetes mellitus -     Hemoglobin A1c  Screening for hematuria or proteinuria -     Urinalysis, Routine w reflex microscopic -     Microalbumin / creatinine urine ratio  Screening for hyperlipidemia -     Lipid panel  Screening, anemia, deficiency, iron -     Iron,Total/Total Iron Binding Cap -     Vitamin B12  Screening for malignant neoplasm of cervix -     Herpes I and II, IgM  Vaginal atrophy Vitamin E supp, albolene   Discussed med's effects and SE's. Screening labs and tests as requested with regular follow-up as recommended.  HPI 48 y.o. female  presents for a complete physical.  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 Griffith Citron. She is on arimedex, and she is on zoledex monthly to stay in menopause but is having hot flashes.  Next week going to consult with GYN/ONC, she is interested in getting vaginal [place] with [place] with Dr. Claiborne Billings.   Still wants to have kids, holding out hope a little,   She is working out, she is trying to eat less sugar, she complains of rash on her back and fatigue, she is following with oncology ans has seen them for this.  BMI is Body mass index is 24.73 kg/m., she is working on diet and exercise. Wt Readings from Last 3 Encounters:  12/27/18 153 lb 3.2 oz (69.5 kg)  11/10/18 153 lb (69.4 kg)  11/04/18 151 lb 6.4 oz (68.7 kg)    Her  blood pressure has been controlled at home, today their BP is BP: 118/76  Patient is on Vitamin D supplement.  Lab Results  Component Value Date   VD25OH 105 (H) 12/21/2017   Last cholesterol was normal Lab Results  Component Value Date   CHOL 152 12/21/2017   HDL 42 (L) 12/21/2017   LDLCALC 93 12/21/2017   TRIG 81 12/21/2017   CHOLHDL 3.6 12/21/2017   Last A1C was normal Lab Results  Component Value Date   HGBA1C 4.6 12/16/2016    Current Medications:  Current Outpatient Medications on File Prior to Visit  Medication Sig  . anastrozole (ARIMIDEX) 1 MG tablet TAKE 1 TABLET BY MOUTH EVERY DAY  . Ascorbic Acid (VITAMIN C PO) Take 400 Units by mouth daily.   Marland Kitchen b complex vitamins tablet Take 1 tablet by mouth daily.  . Cholecalciferol (VITAMIN D3) 5000 units TABS Take 10,000 Units by mouth daily.  . ferrous sulfate 325 (65 FE) MG tablet Take 325 mg by mouth daily with breakfast.  . fish oil-omega-3 fatty acids 1000 MG capsule Take 1 g by mouth daily.  Marland Kitchen gabapentin (NEURONTIN) 300 MG capsule Take 1 capsule (300 mg total) by mouth at bedtime.  Marland Kitchen OVER THE COUNTER MEDICATION Take 1 tablet by mouth at bedtime as needed (constipation).   . TURMERIC PO  Take 1 capsule by mouth as directed.   Marland Kitchen UNABLE TO FIND Take 500 mg by mouth daily. [place] Tail mushroom  . UNABLE TO FIND Take 500 mg by mouth daily. [place] Mushroom  . [DISCONTINUED] prochlorperazine (COMPAZINE) 10 MG tablet Take 1 tablet (10 mg total) by mouth every 6 (six) hours as needed (Nausea or vomiting).   No current facility-administered medications on file prior to visit.    Health Maintenance:  Immunization History  Administered Date(s) Administered  . Hepatitis A 10/02/1997  . Hepatitis B 12/04/1997  . Td 10/10/2008   Tetanus: 2010- due next year Pneumovax: N/A Flu vaccine: N/A Zostavax: N/A Pap: 2016, never normal pap, negative HPV, due 5 years 2021 MGM: 06/2017 2017s/p lumpectomy and chemo, had breast  reduction.  DEXA: 08/2018 on arimedix Colonoscopy: due age 5 EGD: N/A Echo 09/2018 Ct 04/2017  Medical History:  Past Medical History:  Diagnosis Date  . Breast cancer ([place])   . Family history of ovarian cancer 09/02/2017  . Family history of prostate cancer in father 09/02/2017  . Family history of uterine cancer 09/02/2017  . Plantar fasciitis     Allergies Allergies  Allergen Reactions  . Adhesive [Tape]   . Milk-Related Compounds Diarrhea    Stomach cramping  . Poison Ivy Extract [Poison Ivy Extract] Hives, Itching and Rash    SURGICAL HISTORY She  has a past surgical history that includes right lumpectomy, sentinel node biopsy, and bilateral mammoplasty (10/2017). FAMILY HISTORY Her family history includes Basal cell carcinoma in her brother; Hydrocephalus in her cousin; Other in her sister; Ovarian cancer (age of onset: 71) in her mother; Prostate cancer (age of onset: 62) in her father; Skin cancer in her maternal grandmother and paternal uncle; Stroke (age of onset: 88) in her maternal aunt and paternal grandfather; Ulcerative colitis in her sister; Uterine cancer (age of onset: 63) in her maternal aunt. SOCIAL HISTORY She  reports that she has never smoked. She has never used smokeless tobacco. She reports current alcohol use. She reports that she does not use drugs.  Review of Systems  Constitutional: Negative.   HENT: Negative.   Eyes: Negative.   Respiratory: Negative.   Cardiovascular: Negative.   Gastrointestinal: Negative.   Genitourinary: Negative.   Musculoskeletal: Positive for back pain. Negative for falls, joint pain, myalgias and neck pain.  Skin: Negative.   Neurological: Negative.   Endo/Heme/Allergies: Negative.   Psychiatric/Behavioral: Negative.      Physical Exam: Estimated body mass index is 24.73 kg/m as calculated from the following:   Height as of this encounter: 5' 6"  (1.676 m).   Weight as of this encounter: 153 lb 3.2 oz (69.5  kg). Vitals:   12/27/18 1353  BP: 118/76  Pulse: 74  Temp: 97.6 F (36.4 C)  SpO2: 96%   General Appearance: Well nourished, in no apparent distress. Eyes: PERRLA, EOMs, conjunctiva no swelling or erythema, normal fundi and vessels. Sinuses: No Frontal/maxillary tenderness ENT/Mouth: Ext aud canals clear, normal light reflex with TMs without erythema, bulging.  Good dentition. No erythema, swelling, or exudate on post pharynx. Tonsils not swollen or erythematous. Hearing normal.  Neck: Supple, thyroid normal. No bruits Respiratory: Respiratory effort normal, BS equal bilaterally without rales, rhonchi, wheezing or stridor. Cardio: RRR without murmurs, rubs or gallops. Brisk peripheral pulses without edema.  Chest: symmetric, with normal excursions and percussion. Breasts: Symmetric, very dense breast, without nipple discharge, retractions. Abdomen: Soft, +BS. Non tender, no guarding, rebound, hernias, masses, or organomegaly. Lymphatics:  Non tender without lymphadenopathy.  Genitourinary: defer Musculoskeletal: Full ROM all peripheral extremities,5/5 strength, and normal gait. Skin: Warm, dry without rashes, lesions, ecchymosis.  Neuro: Cranial nerves intact, reflexes equal bilaterally. Normal muscle tone, no cerebellar symptoms. Sensation intact.  Psych: Awake and oriented X 3, normal affect, Insight and Judgment appropriate.   EKG: defer  Vicie Mutters 2:45 PM

## 2018-12-27 ENCOUNTER — Ambulatory Visit (INDEPENDENT_AMBULATORY_CARE_PROVIDER_SITE_OTHER): Payer: BC Managed Care – PPO | Admitting: Physician Assistant

## 2018-12-27 ENCOUNTER — Encounter: Payer: Self-pay | Admitting: Physician Assistant

## 2018-12-27 ENCOUNTER — Other Ambulatory Visit: Payer: Self-pay

## 2018-12-27 VITALS — BP 118/76 | HR 74 | Temp 97.6°F | Ht 66.0 in | Wt 153.2 lb

## 2018-12-27 DIAGNOSIS — R922 Inconclusive mammogram: Secondary | ICD-10-CM

## 2018-12-27 DIAGNOSIS — Z130 Encounter for screening for diseases of the blood and blood-forming organs and certain disorders involving the immune mechanism: Secondary | ICD-10-CM

## 2018-12-27 DIAGNOSIS — Z1329 Encounter for screening for other suspected endocrine disorder: Secondary | ICD-10-CM

## 2018-12-27 DIAGNOSIS — N952 Postmenopausal atrophic vaginitis: Secondary | ICD-10-CM

## 2018-12-27 DIAGNOSIS — E559 Vitamin D deficiency, unspecified: Secondary | ICD-10-CM

## 2018-12-27 DIAGNOSIS — Z13220 Encounter for screening for lipoid disorders: Secondary | ICD-10-CM | POA: Diagnosis not present

## 2018-12-27 DIAGNOSIS — Z0000 Encounter for general adult medical examination without abnormal findings: Secondary | ICD-10-CM

## 2018-12-27 DIAGNOSIS — C50211 Malignant neoplasm of upper-inner quadrant of right female breast: Secondary | ICD-10-CM

## 2018-12-27 DIAGNOSIS — Z1389 Encounter for screening for other disorder: Secondary | ICD-10-CM

## 2018-12-27 DIAGNOSIS — Z131 Encounter for screening for diabetes mellitus: Secondary | ICD-10-CM

## 2018-12-27 DIAGNOSIS — T451X5A Adverse effect of antineoplastic and immunosuppressive drugs, initial encounter: Secondary | ICD-10-CM

## 2018-12-27 DIAGNOSIS — Z124 Encounter for screening for malignant neoplasm of cervix: Secondary | ICD-10-CM | POA: Diagnosis not present

## 2018-12-27 DIAGNOSIS — Z79899 Other long term (current) drug therapy: Secondary | ICD-10-CM | POA: Diagnosis not present

## 2018-12-27 DIAGNOSIS — G620 Drug-induced polyneuropathy: Secondary | ICD-10-CM

## 2018-12-27 DIAGNOSIS — Z170 Estrogen receptor positive status [ER+]: Secondary | ICD-10-CM

## 2018-12-27 NOTE — Patient Instructions (Addendum)
Vit E suppositories 30 IU Can do daily for 1 week and then 2-3 x a week.   VAGINAL DRYNESS OVERVIEW  Vaginal dryness, also known as atrophic vaginitis, is a common condition in postmenopausal women. This condition is also common in women who have had both ovaries removed at the time of hysterectomy.   Some women have uncomfortable symptoms of vaginal dryness, such as pain with sex, burning vaginal discomfort or itching, or abnormal vaginal discharge, while others have no symptoms at all.  VAGINAL DRYNESS CAUSES   Estrogen helps to keep the vagina moist and to maintain thickness of the vaginal lining. Vaginal dryness occurs when the ovaries produce a decreased amount of estrogen. This can occur at certain times in a woman's life, and may be permanent or temporary. Times when less estrogen is made include: ?At the time of menopause. ?After surgical removal of the ovaries, chemotherapy, or radiation therapy of the pelvis for cancer. ?After having a baby, particularly in women who breastfeed. ?While using certain medications, such as danazol, medroxyprogesterone (brand names: Provera or DepoProvera), leuprolide (brand name: Lupron), or nafarelin. When these medications are stopped, estrogen production resumes.  Women who smoke cigarettes have been shown to have an increased risk of an earlier menopause transition as compared to non-smokers. Therefore, atrophic vaginitis symptoms may appear at a younger age in this population.  VAGINAL DRYNESS TREATMENT   There are three treatment options for women with vaginal dryness:  Vaginal lubricants and moisturizers - Vaginal lubricants and moisturizers can be purchased without a prescription. These products do not contain any hormones and have virtually no side effects. - Albolene is found in the facial cleanser section at [facility], [facility], or [employer]. It is a large jar with a blue top. This is the best lubricant for women because it is hypoallergenic. -Natural  lubricants, such as olive, avocado or peanut oil, are easily available products that may be used as a lubricant with sex.  -Vaginal moisturizes (eg, Replens, Moist Again, Vagisil, K-Y Silk-E, and Feminease) are formulated to allow water to be retained in the vaginal tissues. Moisturizers are applied into the vagina three times weekly to allow a continued moisturizing effect. These should not be used just before having sex, as they can be irritating.   Know what a healthy weight is for you (roughly BMI <25) and aim to maintain this  Aim for 7+ servings of fruits and vegetables daily  65-80+ fluid ounces of water or unsweet tea for healthy kidneys  Limit to max 1 drink of alcohol per day; avoid smoking/tobacco  Limit animal fats in diet for cholesterol and heart health - choose grass fed whenever available  Avoid highly processed foods, and foods high in saturated/trans fats  Aim for low stress - take time to unwind and care for your mental health  Aim for 150 min of moderate intensity exercise weekly for heart health, and weights twice weekly for bone health  Aim for 7-9 hours of sleep daily

## 2018-12-28 LAB — CBC WITH DIFFERENTIAL/PLATELET
ABSOLUTE MONOCYTES: 353 {cells}/uL (ref 200–950)
Basophils Absolute: 39 cells/uL (ref 0–200)
Basophils Relative: 0.8 %
Eosinophils Absolute: 260 cells/uL (ref 15–500)
Eosinophils Relative: 5.3 %
HCT: 39.6 % (ref 35.0–45.0)
Hemoglobin: 13.8 g/dL (ref 11.7–15.5)
LYMPHS ABS: 1039 {cells}/uL (ref 850–3900)
MCH: 34.4 pg — ABNORMAL HIGH (ref 27.0–33.0)
MCHC: 34.8 g/dL (ref 32.0–36.0)
MCV: 98.8 fL (ref 80.0–100.0)
MPV: 9.7 fL (ref 7.5–12.5)
Monocytes Relative: 7.2 %
Neutro Abs: 3210 cells/uL (ref 1500–7800)
Neutrophils Relative %: 65.5 %
Platelets: 249 10*3/uL (ref 140–400)
RBC: 4.01 10*6/uL (ref 3.80–5.10)
RDW: 12.4 % (ref 11.0–15.0)
TOTAL LYMPHOCYTE: 21.2 %
WBC: 4.9 10*3/uL (ref 3.8–10.8)

## 2018-12-28 LAB — HEMOGLOBIN A1C
Hgb A1c MFr Bld: 4.8 % of total Hgb (ref <5.7)
MEAN PLASMA GLUCOSE: 91 (calc)
eAG (mmol/L): 5 (calc)

## 2018-12-28 LAB — URINALYSIS, ROUTINE W REFLEX MICROSCOPIC
BACTERIA UA: NONE SEEN /HPF
Bilirubin Urine: NEGATIVE
Glucose, UA: NEGATIVE
Hgb urine dipstick: NEGATIVE
Hyaline Cast: NONE SEEN /LPF
Ketones, ur: NEGATIVE
Nitrite: NEGATIVE
PH: 7 (ref 5.0–8.0)
Protein, ur: NEGATIVE
RBC / HPF: NONE SEEN /HPF (ref 0–2)
Specific Gravity, Urine: 1.008 (ref 1.001–1.03)
Squamous Epithelial / HPF: NONE SEEN /HPF (ref <=5)
WBC, UA: NONE SEEN /HPF (ref 0–5)

## 2018-12-28 LAB — COMPLETE METABOLIC PANEL WITH GFR
AG Ratio: 1.9 (calc) (ref 1.0–2.5)
ALBUMIN MSPROF: 4.6 g/dL (ref 3.6–5.1)
ALKALINE PHOSPHATASE (APISO): 79 U/L (ref 31–125)
ALT: 22 U/L (ref 6–29)
AST: 21 U/L (ref 10–35)
BUN: 13 mg/dL (ref 7–25)
CO2: 31 mmol/L (ref 20–32)
Calcium: 9.7 mg/dL (ref 8.6–10.2)
Chloride: 103 mmol/L (ref 98–110)
Creat: 0.51 mg/dL (ref 0.50–1.10)
GFR, Est African American: 135 mL/min/{1.73_m2} (ref >=60)
GFR, Est Non African American: 116 mL/min/{1.73_m2} (ref >=60)
GLUCOSE: 109 mg/dL — AB (ref 65–99)
Globulin: 2.4 g/dL (calc) (ref 1.9–3.7)
Potassium: 4.2 mmol/L (ref 3.5–5.3)
Sodium: 140 mmol/L (ref 135–146)
Total Bilirubin: 0.6 mg/dL (ref 0.2–1.2)
Total Protein: 7 g/dL (ref 6.1–8.1)

## 2018-12-28 LAB — LIPID PANEL
Cholesterol: 176 mg/dL (ref <200)
HDL: 61 mg/dL (ref >=50)
LDL Cholesterol (Calc): 101 mg/dL (calc) — ABNORMAL HIGH
Non-HDL Cholesterol (Calc): 115 mg/dL (calc) (ref <130)
Total CHOL/HDL Ratio: 2.9 (calc) (ref <5.0)
Triglycerides: 57 mg/dL (ref <150)

## 2018-12-28 LAB — MAGNESIUM: Magnesium: 2.2 mg/dL (ref 1.5–2.5)

## 2018-12-28 LAB — MICROALBUMIN / CREATININE URINE RATIO
Creatinine, Urine: 17 mg/dL — ABNORMAL LOW (ref 20–275)
MICROALB UR: 0.2 mg/dL
Microalb Creat Ratio: 12 mcg/mg creat (ref <30)

## 2018-12-28 LAB — VITAMIN B12: Vitamin B-12: 1337 pg/mL — ABNORMAL HIGH (ref 200–1100)

## 2018-12-28 LAB — VITAMIN D 25 HYDROXY (VIT D DEFICIENCY, FRACTURES): VIT D 25 HYDROXY: 103 ng/mL — AB (ref 30–100)

## 2018-12-28 LAB — IRON, TOTAL/TOTAL IRON BINDING CAP
%SAT: 50 % (calc) — ABNORMAL HIGH (ref 16–45)
Iron: 165 ug/dL (ref 40–190)
TIBC: 330 mcg/dL (calc) (ref 250–450)

## 2018-12-28 LAB — TSH: TSH: 1.67 mIU/L

## 2018-12-29 LAB — PAP, TP IMAGING W/ HPV RNA, RFLX HPV TYPE 16,18/45: HPV DNA High Risk: NOT DETECTED

## 2018-12-29 LAB — PAP, TP IMAGING, WNL RFLX HPV

## 2019-01-04 ENCOUNTER — Other Ambulatory Visit: Payer: Self-pay

## 2019-01-04 ENCOUNTER — Inpatient Hospital Stay: Payer: BC Managed Care – PPO | Attending: Oncology

## 2019-01-04 DIAGNOSIS — Z79818 Long term (current) use of other agents affecting estrogen receptors and estrogen levels: Secondary | ICD-10-CM | POA: Diagnosis not present

## 2019-01-04 DIAGNOSIS — Z170 Estrogen receptor positive status [ER+]: Secondary | ICD-10-CM | POA: Diagnosis not present

## 2019-01-04 DIAGNOSIS — Z923 Personal history of irradiation: Secondary | ICD-10-CM | POA: Diagnosis not present

## 2019-01-04 DIAGNOSIS — Z95828 Presence of other vascular implants and grafts: Secondary | ICD-10-CM

## 2019-01-04 DIAGNOSIS — C50211 Malignant neoplasm of upper-inner quadrant of right female breast: Secondary | ICD-10-CM

## 2019-01-04 DIAGNOSIS — Z9221 Personal history of antineoplastic chemotherapy: Secondary | ICD-10-CM | POA: Diagnosis not present

## 2019-01-04 MED ORDER — GOSERELIN ACETATE 3.6 MG ~~LOC~~ IMPL
DRUG_IMPLANT | SUBCUTANEOUS | Status: AC
  Filled 2019-01-04: qty 3.6

## 2019-01-04 MED ORDER — GOSERELIN ACETATE 3.6 MG ~~LOC~~ IMPL
3.6000 mg | DRUG_IMPLANT | Freq: Once | SUBCUTANEOUS | Status: AC
  Administered 2019-01-04: 16:00:00 3.6 mg via SUBCUTANEOUS

## 2019-01-04 NOTE — Patient Instructions (Signed)
Goserelin injection What is this medicine? GOSERELIN (GOE se rel in) is similar to a hormone found in the body. It lowers the amount of sex hormones that the body makes. Men will have lower testosterone levels and women will have lower estrogen levels while taking this medicine. In men, this medicine is used to treat prostate cancer; the injection is either given once per month or once every 12 weeks. A once per month injection (only) is used to treat women with endometriosis, dysfunctional uterine bleeding, or advanced breast cancer. This medicine may be used for other purposes; ask your health care provider or pharmacist if you have questions. COMMON BRAND NAME(S): Zoladex What should I tell my health care provider before I take this medicine? They need to know if you have any of these conditions (some only apply to women): -diabetes -heart disease or previous heart attack -high blood pressure -high cholesterol -kidney disease -osteoporosis or low bone density -problems passing urine -spinal cord injury -stroke -tobacco smoker -an unusual or allergic reaction to goserelin, hormone therapy, other medicines, foods, dyes, or preservatives -pregnant or trying to get pregnant -breast-feeding How should I use this medicine? This medicine is for injection under the skin. It is given by a health care professional in a hospital or clinic setting. Men receive this injection once every 4 weeks or once every 12 weeks. Women will only receive the once every 4 weeks injection. Talk to your pediatrician regarding the use of this medicine in children. Special care may be needed. Overdosage: If you think you have taken too much of this medicine contact a poison control center or emergency room at once. NOTE: This medicine is only for you. Do not share this medicine with others. What if I miss a dose? It is important not to miss your dose. Call your doctor or health care professional if you are unable to  keep an appointment. What may interact with this medicine? -female hormones like estrogen -herbal or dietary supplements like black cohosh, chasteberry, or DHEA -female hormones like testosterone -prasterone This list may not describe all possible interactions. Give your health care provider a list of all the medicines, herbs, non-prescription drugs, or dietary supplements you use. Also tell them if you smoke, drink alcohol, or use illegal drugs. Some items may interact with your medicine. What should I watch for while using this medicine? Visit your doctor or health care professional for regular checks on your progress. Your symptoms may appear to get worse during the first weeks of this therapy. Tell your doctor or healthcare professional if your symptoms do not start to get better or if they get worse after this time. Your bones may get weaker if you take this medicine for a long time. If you smoke or frequently drink alcohol you may increase your risk of bone loss. A family history of osteoporosis, chronic use of drugs for seizures (convulsions), or corticosteroids can also increase your risk of bone loss. Talk to your doctor about how to keep your bones strong. This medicine should stop regular monthly menstration in women. Tell your doctor if you continue to [facility]. Women should not become pregnant while taking this medicine or for 12 weeks after stopping this medicine. Women should inform their doctor if they wish to become pregnant or think they might be pregnant. There is a potential for serious side effects to an unborn child. Talk to your health care professional or pharmacist for more information. Do not breast-feed an infant while taking  this medicine. Men should inform their doctors if they wish to father a child. This medicine may lower sperm counts. Talk to your health care professional or pharmacist for more information. What side effects may I notice from receiving this  medicine? Side effects that you should report to your doctor or health care professional as soon as possible: -allergic reactions like skin rash, itching or hives, swelling of the face, lips, or tongue -bone pain -breathing problems -changes in vision -chest pain -feeling faint or lightheaded, falls -fever, chills -pain, swelling, warmth in the leg -pain, tingling, numbness in the hands or feet -signs and symptoms of low blood pressure like dizziness; feeling faint or lightheaded, falls; unusually weak or tired -stomach pain -swelling of the ankles, feet, hands -trouble passing urine or change in the amount of urine -unusually high or low blood pressure -unusually weak or tired Side effects that usually do not require medical attention (report to your doctor or health care professional if they continue or are bothersome): -change in sex drive or performance -changes in breast size in both males and females -changes in emotions or moods -headache -hot flashes -irritation at site where injected -loss of appetite -skin problems like acne, dry skin -vaginal dryness This list may not describe all possible side effects. Call your doctor for medical advice about side effects. You may report side effects to FDA at 1-800-FDA-1088. Where should I keep my medicine? This drug is given in a hospital or clinic and will not be stored at home. NOTE: This sheet is a summary. It may not cover all possible information. If you have questions about this medicine, talk to your doctor, pharmacist, or health care provider.  2018 Elsevier/Gold Standard (2013-11-21 11:10:35)

## 2019-01-12 DIAGNOSIS — Z170 Estrogen receptor positive status [ER+]: Principal | ICD-10-CM

## 2019-01-12 DIAGNOSIS — C50211 Malignant neoplasm of upper-inner quadrant of right female breast: Secondary | ICD-10-CM

## 2019-02-01 ENCOUNTER — Other Ambulatory Visit: Payer: Self-pay

## 2019-02-01 ENCOUNTER — Inpatient Hospital Stay: Payer: BC Managed Care – PPO | Attending: Oncology

## 2019-02-01 VITALS — BP 128/82 | HR 68 | Temp 98.4°F | Resp 17

## 2019-02-01 DIAGNOSIS — C50211 Malignant neoplasm of upper-inner quadrant of right female breast: Secondary | ICD-10-CM

## 2019-02-01 DIAGNOSIS — Z79818 Long term (current) use of other agents affecting estrogen receptors and estrogen levels: Secondary | ICD-10-CM | POA: Insufficient documentation

## 2019-02-01 DIAGNOSIS — Z171 Estrogen receptor negative status [ER-]: Secondary | ICD-10-CM | POA: Diagnosis not present

## 2019-02-01 DIAGNOSIS — Z8041 Family history of malignant neoplasm of ovary: Secondary | ICD-10-CM | POA: Insufficient documentation

## 2019-02-01 DIAGNOSIS — Z95828 Presence of other vascular implants and grafts: Secondary | ICD-10-CM

## 2019-02-01 DIAGNOSIS — Z170 Estrogen receptor positive status [ER+]: Secondary | ICD-10-CM

## 2019-02-01 MED ORDER — GOSERELIN ACETATE 3.6 MG ~~LOC~~ IMPL
DRUG_IMPLANT | SUBCUTANEOUS | Status: AC
  Filled 2019-02-01: qty 3.6

## 2019-02-01 MED ORDER — GOSERELIN ACETATE 3.6 MG ~~LOC~~ IMPL
3.6000 mg | DRUG_IMPLANT | Freq: Once | SUBCUTANEOUS | Status: AC
  Administered 2019-02-01: 3.6 mg via SUBCUTANEOUS

## 2019-02-01 NOTE — Patient Instructions (Signed)
Goserelin injection What is this medicine? GOSERELIN (GOE se rel in) is similar to a hormone found in the body. It lowers the amount of sex hormones that the body makes. Men will have lower testosterone levels and women will have lower estrogen levels while taking this medicine. In men, this medicine is used to treat prostate cancer; the injection is either given once per month or once every 12 weeks. A once per month injection (only) is used to treat women with endometriosis, dysfunctional uterine bleeding, or advanced breast cancer. This medicine may be used for other purposes; ask your health care provider or pharmacist if you have questions. COMMON BRAND NAME(S): Zoladex What should I tell my health care provider before I take this medicine? They need to know if you have any of these conditions (some only apply to women): -diabetes -heart disease or previous heart attack -high blood pressure -high cholesterol -kidney disease -osteoporosis or low bone density -problems passing urine -spinal cord injury -stroke -tobacco smoker -an unusual or allergic reaction to goserelin, hormone therapy, other medicines, foods, dyes, or preservatives -pregnant or trying to get pregnant -breast-feeding How should I use this medicine? This medicine is for injection under the skin. It is given by a health care professional in a hospital or clinic setting. Men receive this injection once every 4 weeks or once every 12 weeks. Women will only receive the once every 4 weeks injection. Talk to your pediatrician regarding the use of this medicine in children. Special care may be needed. Overdosage: If you think you have taken too much of this medicine contact a poison control center or emergency room at once. NOTE: This medicine is only for you. Do not share this medicine with others. What if I miss a dose? It is important not to miss your dose. Call your doctor or health care professional if you are unable to  keep an appointment. What may interact with this medicine? -female hormones like estrogen -herbal or dietary supplements like black cohosh, chasteberry, or DHEA -female hormones like testosterone -prasterone This list may not describe all possible interactions. Give your health care provider a list of all the medicines, herbs, non-prescription drugs, or dietary supplements you use. Also tell them if you smoke, drink alcohol, or use illegal drugs. Some items may interact with your medicine. What should I watch for while using this medicine? Visit your doctor or health care professional for regular checks on your progress. Your symptoms may appear to get worse during the first weeks of this therapy. Tell your doctor or healthcare professional if your symptoms do not start to get better or if they get worse after this time. Your bones may get weaker if you take this medicine for a long time. If you smoke or frequently drink alcohol you may increase your risk of bone loss. A family history of osteoporosis, chronic use of drugs for seizures (convulsions), or corticosteroids can also increase your risk of bone loss. Talk to your doctor about how to keep your bones strong. This medicine should stop regular monthly menstration in women. Tell your doctor if you continue to [facility]. Women should not become pregnant while taking this medicine or for 12 weeks after stopping this medicine. Women should inform their doctor if they wish to become pregnant or think they might be pregnant. There is a potential for serious side effects to an unborn child. Talk to your health care professional or pharmacist for more information. Do not breast-feed an infant while taking  this medicine. Men should inform their doctors if they wish to father a child. This medicine may lower sperm counts. Talk to your health care professional or pharmacist for more information. What side effects may I notice from receiving this  medicine? Side effects that you should report to your doctor or health care professional as soon as possible: -allergic reactions like skin rash, itching or hives, swelling of the face, lips, or tongue -bone pain -breathing problems -changes in vision -chest pain -feeling faint or lightheaded, falls -fever, chills -pain, swelling, warmth in the leg -pain, tingling, numbness in the hands or feet -signs and symptoms of low blood pressure like dizziness; feeling faint or lightheaded, falls; unusually weak or tired -stomach pain -swelling of the ankles, feet, hands -trouble passing urine or change in the amount of urine -unusually high or low blood pressure -unusually weak or tired Side effects that usually do not require medical attention (report to your doctor or health care professional if they continue or are bothersome): -change in sex drive or performance -changes in breast size in both males and females -changes in emotions or moods -headache -hot flashes -irritation at site where injected -loss of appetite -skin problems like acne, dry skin -vaginal dryness This list may not describe all possible side effects. Call your doctor for medical advice about side effects. You may report side effects to FDA at 1-800-FDA-1088. Where should I keep my medicine? This drug is given in a hospital or clinic and will not be stored at home. NOTE: This sheet is a summary. It may not cover all possible information. If you have questions about this medicine, talk to your doctor, pharmacist, or health care provider.  2018 Elsevier/Gold Standard (2013-11-21 11:10:35)

## 2019-02-03 ENCOUNTER — Telehealth: Payer: Self-pay | Admitting: *Deleted

## 2019-02-03 ENCOUNTER — Encounter: Payer: Self-pay | Admitting: Gynecologic Oncology

## 2019-02-03 ENCOUNTER — Inpatient Hospital Stay (HOSPITAL_BASED_OUTPATIENT_CLINIC_OR_DEPARTMENT_OTHER): Payer: BC Managed Care – PPO | Admitting: Gynecologic Oncology

## 2019-02-03 DIAGNOSIS — C50212 Malignant neoplasm of upper-inner quadrant of left female breast: Secondary | ICD-10-CM

## 2019-02-03 DIAGNOSIS — Z171 Estrogen receptor negative status [ER-]: Secondary | ICD-10-CM | POA: Diagnosis not present

## 2019-02-03 DIAGNOSIS — Z8041 Family history of malignant neoplasm of ovary: Secondary | ICD-10-CM | POA: Diagnosis not present

## 2019-02-03 NOTE — Patient Instructions (Signed)
Recommending [place] in June with CA 125 and then 6 monthly US's and CA 125 to monitor ovaries.

## 2019-02-03 NOTE — Telephone Encounter (Signed)
Called and gave the patient the appt for June 3rd

## 2019-02-03 NOTE — Progress Notes (Signed)
Gynecologic Oncology Telehealth Consult Note: Gyn-Onc  I connected with Hannah Woodard on 02/03/19 at 11:30 AM EDT by telephone and verified that I am speaking with the correct person using two identifiers.  I discussed the limitations, risks, security and privacy concerns of performing an evaluation and management service by telemedicine and the availability of in-person appointments. I also discussed with the patient that there may be a patient responsible charge related to this service. The patient expressed understanding and agreed to proceed.  Other persons participating in the visit and their role in the encounter: none.  Patient's location: home Provider's location: [place]  Chief Complaint:  Chief Complaint  Patient presents with  . family history of ovarian cancer  . personal history of breast cancer    Consult was requested by Dr. Jana Hakim for the evaluation of Hannah Woodard 48 y.o. female  Assessment/Plan:  Hannah Woodard  is a 48 y.o.  year old with a personal history of triple positive breast cancer at age 60 and family history of ovarian cancer, BRCA negative on genetic testing.  I discussed with Hannah Woodard that she has had increased risk for ovarian cancer compared to the baseline risk however still probably overall a low risk.  They are not clear recommendations for oophorectomy in the setting but is completely reasonable given her hormone receptor positive breast cancer and the knowledge that this would reduce her risk for breast cancer recurrence.  However the patient does desire to consider options for future fertility and therefore would like to keep her ovaries intact for now.  I feel that this is not unreasonable.  Given her family history, if she is electing for ovarian preservation at this time, I would recommend serial transvaginal ultrasounds with Ca1 25's.  I discussed the limitations of the surveillance.  I discussed that this  will not guarantee case finding at an early stage.  I discussed the imperfections with ultrasound.  I discussed that abnormalities may be identified which are in fact benign and may lead to oophorectomy at an earlier time.  The patient is in agreement with the current plan and we will plan on scheduling transvaginal ultrasound scan and Ca1 25 in June.  We will repeat this again in December.  And continue 6 monthly evaluations until she decides it is time for her to have a definitive BSO.   I discussed the assessment and treatment plan with the patient. The patient was provided with an opportunity to ask questions and all were answered. The patient agreed with the plan and demonstrated an understanding of the instructions.   The patient was advised to call back or see an in-person evaluation if the symptoms worsen or if the condition fails to improve as anticipated.   I provided 45 minutes of face-to-face video visit time during this encounter, and > 50% was spent counseling as documented under my assessment & plan.  HPI: Hannah Woodard is a 48 year old P0 who is seen in consultation at the request of Dr Jana Hakim for an increased risk of ovarian cancer.   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  This was treated with surgery radiation and chemotherapy.  She also received trastuzumab, and then goserelin injections for ovarian suppression and anastrozole.  She has remained disease-free since her initial diagnosis.  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  She reports a family history of a  mother who had ovarian cancer in her early 82s.  The patient has received genetic testing for BRCA and HRD and this is negative.  Her sibling had her ovaries removed for prophylactic reasons with no malignancy identified.  Her father had a diagnosis of prostate cancer but eventually died from alcoholic cirrhosis.  The patient is  nulliparous but desires future pregnancy.  She has sought consultation with [facility] reproductive endocrinology.  They feel that she is not a good candidate for egg donation due to her advanced maternal age, however with donation of either an embryo or an egg donor and insemination she might be able to spontaneously conceived.  She would like to keep her options open regarding future pregnancy.  She has had no history of prior abdominal surgeries.  Other than her history of breast cancer she does not have any other significant medical history  Current Meds:  Outpatient Encounter Medications as of 02/03/2019  Medication Sig  . anastrozole (ARIMIDEX) 1 MG tablet TAKE 1 TABLET BY MOUTH EVERY DAY  . Ascorbic Acid (VITAMIN C PO) Take 400 Units by mouth daily.   Marland Kitchen b complex vitamins tablet Take 1 tablet by mouth daily.  . Cholecalciferol (VITAMIN D3) 5000 units TABS Take 10,000 Units by mouth daily.  . ferrous sulfate 325 (65 FE) MG tablet Take 325 mg by mouth daily with breakfast.  . fish oil-omega-3 fatty acids 1000 MG capsule Take 1 g by mouth daily.  Marland Kitchen gabapentin (NEURONTIN) 300 MG capsule Take 1 capsule (300 mg total) by mouth at bedtime.  Marland Kitchen OVER THE COUNTER MEDICATION Take 1 tablet by mouth at bedtime as needed (constipation).   . TURMERIC PO Take 1 capsule by mouth as directed.   Marland Kitchen UNABLE TO FIND Take 500 mg by mouth daily. [place] mushroom  . UNABLE TO FIND Take 500 mg by mouth daily. [place] Mushroom  . [DISCONTINUED] prochlorperazine (COMPAZINE) 10 MG tablet Take 1 tablet (10 mg total) by mouth every 6 (six) hours as needed (Nausea or vomiting).   No facility-administered encounter medications on file as of 02/03/2019.     Allergy:  Allergies  Allergen Reactions  . Adhesive [Tape]   . Milk-Related Compounds Diarrhea    Stomach cramping  . Poison Ivy Extract [Poison Ivy Extract] Hives, Itching and Rash    Social Hx:   Social History   Socioeconomic History  . Marital status:  Married    Spouse name: Not on file  . Number of children: Not on file  . Years of education: Not on file  . Highest education level: Not on file  Occupational History  . Not on file  Social Needs  . Financial resource strain: Not on file  . Food insecurity:    Worry: Not on file    Inability: Not on file  . Transportation needs:    Medical: Not on file    Non-medical: Not on file  Tobacco Use  . Smoking status: Never Smoker  . Smokeless tobacco: Never Used  Substance and Sexual Activity  . Alcohol use: Yes  . Drug use: No  . Sexual activity: Not on file  Lifestyle  . Physical activity:    Days per week: Not on file    Minutes per session: Not on file  . Stress: Not on file  Relationships  . Social connections:    Talks on phone: Not on file    Gets together: Not on file    Attends religious service: Not on  file    Active member of club or organization: Not on file    Attends meetings of clubs or organizations: Not on file    Relationship status: Not on file  . Intimate partner violence:    Fear of current or ex partner: Not on file    Emotionally abused: Not on file    Physically abused: Not on file    Forced sexual activity: Not on file  Other Topics Concern  . Not on file  Social History Narrative  . Not on file    Past Surgical Hx:  Past Surgical History:  Procedure Laterality Date  . right lumpectomy, sentinel node biopsy, and bilateral mammoplasty  10/2017   [facility]    Past Medical Hx:  Past Medical History:  Diagnosis Date  . Breast cancer ([place])   . Family history of ovarian cancer 09/02/2017  . Family history of prostate cancer in father 09/02/2017  . Family history of uterine cancer 09/02/2017  . Plantar fasciitis     Past Gynecological History:  Amenorrheic on goserelin.G0  Patient's last menstrual period was 10/05/2017.  Family Hx:  Family History  Problem Relation Age of Onset  . Ovarian cancer Mother 29  . Prostate cancer Father 71        'high gleason' unsure number  . Ulcerative colitis Sister   . Other Sister        abn ovaian growth, partial hysterectomy  . Uterine cancer Maternal Aunt 51  . Stroke Maternal Aunt 66  . Basal cell carcinoma Brother   . Skin cancer Paternal Uncle   . Skin cancer Maternal Grandmother   . Stroke Paternal Grandfather 57  . Hydrocephalus Cousin     Review of Systems:  Constitutional  Feels well,    ENT Normal appearing ears and nares bilaterally Skin/Breast  No rash, sores, jaundice, itching, dryness Cardiovascular  No chest pain, shortness of breath, or edema  Pulmonary  No cough or wheeze.  Gastro Intestinal  No nausea, vomitting, or diarrhoea. No bright red blood per rectum, no abdominal pain, change in bowel movement, or constipation.  Genito Urinary  No frequency, urgency, dysuria, no bleeding Musculo Skeletal  No myalgia, arthralgia, joint swelling or pain  Neurologic  No weakness, numbness, change in gait,  Psychology  No depression, anxiety, insomnia.   Vitals:   not available  Physical Exam: Appears in no distress on video, physical exam deferred  Thereasa Solo, MD  02/03/2019, 1:57 PM

## 2019-02-21 ENCOUNTER — Telehealth: Payer: Self-pay | Admitting: Adult Health

## 2019-02-21 NOTE — Telephone Encounter (Signed)
Moved 6/9 to 6/17 per sch msg. Called and left msg for patient

## 2019-02-28 ENCOUNTER — Other Ambulatory Visit: Payer: Self-pay | Admitting: *Deleted

## 2019-02-28 DIAGNOSIS — C50211 Malignant neoplasm of upper-inner quadrant of right female breast: Secondary | ICD-10-CM

## 2019-03-01 ENCOUNTER — Ambulatory Visit (HOSPITAL_COMMUNITY)
Admission: RE | Admit: 2019-03-01 | Discharge: 2019-03-01 | Disposition: A | Discharge status: Home / Self Care | Payer: BC Managed Care – PPO | Source: Ambulatory Visit | Attending: Gynecologic Oncology | Admitting: Gynecologic Oncology

## 2019-03-01 ENCOUNTER — Inpatient Hospital Stay: Payer: BC Managed Care – PPO

## 2019-03-01 ENCOUNTER — Other Ambulatory Visit: Payer: Self-pay

## 2019-03-01 ENCOUNTER — Inpatient Hospital Stay: Payer: BC Managed Care – PPO | Attending: Oncology

## 2019-03-01 VITALS — BP 120/88 | HR 95 | Temp 98.9°F | Resp 18

## 2019-03-01 DIAGNOSIS — Z9221 Personal history of antineoplastic chemotherapy: Secondary | ICD-10-CM | POA: Insufficient documentation

## 2019-03-01 DIAGNOSIS — Z808 Family history of malignant neoplasm of other organs or systems: Secondary | ICD-10-CM | POA: Diagnosis not present

## 2019-03-01 DIAGNOSIS — C50211 Malignant neoplasm of upper-inner quadrant of right female breast: Secondary | ICD-10-CM | POA: Diagnosis not present

## 2019-03-01 DIAGNOSIS — Z79899 Other long term (current) drug therapy: Secondary | ICD-10-CM | POA: Diagnosis not present

## 2019-03-01 DIAGNOSIS — Z95828 Presence of other vascular implants and grafts: Secondary | ICD-10-CM

## 2019-03-01 DIAGNOSIS — Z8041 Family history of malignant neoplasm of ovary: Secondary | ICD-10-CM | POA: Insufficient documentation

## 2019-03-01 DIAGNOSIS — T451X5S Adverse effect of antineoplastic and immunosuppressive drugs, sequela: Secondary | ICD-10-CM | POA: Diagnosis not present

## 2019-03-01 DIAGNOSIS — G620 Drug-induced polyneuropathy: Secondary | ICD-10-CM | POA: Insufficient documentation

## 2019-03-01 DIAGNOSIS — Z923 Personal history of irradiation: Secondary | ICD-10-CM | POA: Insufficient documentation

## 2019-03-01 DIAGNOSIS — R232 Flushing: Secondary | ICD-10-CM | POA: Insufficient documentation

## 2019-03-01 DIAGNOSIS — M2550 Pain in unspecified joint: Secondary | ICD-10-CM | POA: Insufficient documentation

## 2019-03-01 DIAGNOSIS — Z170 Estrogen receptor positive status [ER+]: Secondary | ICD-10-CM

## 2019-03-01 DIAGNOSIS — Z8042 Family history of malignant neoplasm of prostate: Secondary | ICD-10-CM | POA: Insufficient documentation

## 2019-03-01 DIAGNOSIS — Z79811 Long term (current) use of aromatase inhibitors: Secondary | ICD-10-CM | POA: Insufficient documentation

## 2019-03-01 LAB — CMP (CANCER CENTER ONLY)
ALT: 18 U/L (ref 0–44)
AST: 20 U/L (ref 15–41)
Albumin: 4.2 g/dL (ref 3.5–5.0)
Alkaline Phosphatase: 90 U/L (ref 38–126)
Anion gap: 10 (ref 5–15)
BUN: 17 mg/dL (ref 6–20)
CO2: 29 mmol/L (ref 22–32)
Calcium: 9.3 mg/dL (ref 8.9–10.3)
Chloride: 103 mmol/L (ref 98–111)
Creatinine: 0.73 mg/dL (ref 0.44–1.00)
GFR, Est AFR Am: >60 mL/min (ref >60)
GFR, Estimated: >60 mL/min (ref >60)
Glucose, Bld: 119 mg/dL — ABNORMAL HIGH (ref 70–99)
Potassium: 3.7 mmol/L (ref 3.5–5.1)
Sodium: 142 mmol/L (ref 135–145)
Total Bilirubin: 0.7 mg/dL (ref 0.3–1.2)
Total Protein: 6.7 g/dL (ref 6.5–8.1)

## 2019-03-01 LAB — CBC WITH DIFFERENTIAL (CANCER CENTER ONLY)
Abs Immature Granulocytes: 0.01 10*3/uL (ref 0.00–0.07)
Basophils Absolute: 0 10*3/uL (ref 0.0–0.1)
Basophils Relative: 1 %
Eosinophils Absolute: 0.1 10*3/uL (ref 0.0–0.5)
Eosinophils Relative: 3 %
HCT: 38.8 % (ref 36.0–46.0)
Hemoglobin: 13.4 g/dL (ref 12.0–15.0)
Immature Granulocytes: 0 %
Lymphocytes Relative: 22 %
Lymphs Abs: 1 10*3/uL (ref 0.7–4.0)
MCH: 33.3 pg (ref 26.0–34.0)
MCHC: 34.5 g/dL (ref 30.0–36.0)
MCV: 96.5 fL (ref 80.0–100.0)
Monocytes Absolute: 0.4 10*3/uL (ref 0.1–1.0)
Monocytes Relative: 9 %
Neutro Abs: 2.8 10*3/uL (ref 1.7–7.7)
Neutrophils Relative %: 65 %
Platelet Count: 204 10*3/uL (ref 150–400)
RBC: 4.02 MIL/uL (ref 3.87–5.11)
RDW: 12.6 % (ref 11.5–15.5)
WBC Count: 4.3 10*3/uL (ref 4.0–10.5)
nRBC: 0 % (ref 0.0–0.2)

## 2019-03-01 MED ORDER — GOSERELIN ACETATE 3.6 MG ~~LOC~~ IMPL
3.6000 mg | DRUG_IMPLANT | Freq: Once | SUBCUTANEOUS | Status: AC
  Administered 2019-03-01: 3.6 mg via SUBCUTANEOUS

## 2019-03-01 MED ORDER — GOSERELIN ACETATE 3.6 MG ~~LOC~~ IMPL
DRUG_IMPLANT | SUBCUTANEOUS | Status: AC
  Filled 2019-03-01: qty 3.6

## 2019-03-01 NOTE — Patient Instructions (Signed)
Goserelin injection What is this medicine? GOSERELIN (GOE se rel in) is similar to a hormone found in the body. It lowers the amount of sex hormones that the body makes. Men will have lower testosterone levels and women will have lower estrogen levels while taking this medicine. In men, this medicine is used to treat prostate cancer; the injection is either given once per month or once every 12 weeks. A once per month injection (only) is used to treat women with endometriosis, dysfunctional uterine bleeding, or advanced breast cancer. This medicine may be used for other purposes; ask your health care provider or pharmacist if you have questions. COMMON BRAND NAME(S): Zoladex What should I tell my health care provider before I take this medicine? They need to know if you have any of these conditions (some only apply to women): -diabetes -heart disease or previous heart attack -high blood pressure -high cholesterol -kidney disease -osteoporosis or low bone density -problems passing urine -spinal cord injury -stroke -tobacco smoker -an unusual or allergic reaction to goserelin, hormone therapy, other medicines, foods, dyes, or preservatives -pregnant or trying to get pregnant -breast-feeding How should I use this medicine? This medicine is for injection under the skin. It is given by a health care professional in a hospital or clinic setting. Men receive this injection once every 4 weeks or once every 12 weeks. Women will only receive the once every 4 weeks injection. Talk to your pediatrician regarding the use of this medicine in children. Special care may be needed. Overdosage: If you think you have taken too much of this medicine contact a poison control center or emergency room at once. NOTE: This medicine is only for you. Do not share this medicine with others. What if I miss a dose? It is important not to miss your dose. Call your doctor or health care professional if you are unable to  keep an appointment. What may interact with this medicine? -female hormones like estrogen -herbal or dietary supplements like black cohosh, chasteberry, or DHEA -female hormones like testosterone -prasterone This list may not describe all possible interactions. Give your health care provider a list of all the medicines, herbs, non-prescription drugs, or dietary supplements you use. Also tell them if you smoke, drink alcohol, or use illegal drugs. Some items may interact with your medicine. What should I watch for while using this medicine? Visit your doctor or health care professional for regular checks on your progress. Your symptoms may appear to get worse during the first weeks of this therapy. Tell your doctor or healthcare professional if your symptoms do not start to get better or if they get worse after this time. Your bones may get weaker if you take this medicine for a long time. If you smoke or frequently drink alcohol you may increase your risk of bone loss. A family history of osteoporosis, chronic use of drugs for seizures (convulsions), or corticosteroids can also increase your risk of bone loss. Talk to your doctor about how to keep your bones strong. This medicine should stop regular monthly menstration in women. Tell your doctor if you continue to [facility]. Women should not become pregnant while taking this medicine or for 12 weeks after stopping this medicine. Women should inform their doctor if they wish to become pregnant or think they might be pregnant. There is a potential for serious side effects to an unborn child. Talk to your health care professional or pharmacist for more information. Do not breast-feed an infant while taking  this medicine. Men should inform their doctors if they wish to father a child. This medicine may lower sperm counts. Talk to your health care professional or pharmacist for more information. What side effects may I notice from receiving this  medicine? Side effects that you should report to your doctor or health care professional as soon as possible: -allergic reactions like skin rash, itching or hives, swelling of the face, lips, or tongue -bone pain -breathing problems -changes in vision -chest pain -feeling faint or lightheaded, falls -fever, chills -pain, swelling, warmth in the leg -pain, tingling, numbness in the hands or feet -signs and symptoms of low blood pressure like dizziness; feeling faint or lightheaded, falls; unusually weak or tired -stomach pain -swelling of the ankles, feet, hands -trouble passing urine or change in the amount of urine -unusually high or low blood pressure -unusually weak or tired Side effects that usually do not require medical attention (report to your doctor or health care professional if they continue or are bothersome): -change in sex drive or performance -changes in breast size in both males and females -changes in emotions or moods -headache -hot flashes -irritation at site where injected -loss of appetite -skin problems like acne, dry skin -vaginal dryness This list may not describe all possible side effects. Call your doctor for medical advice about side effects. You may report side effects to FDA at 1-800-FDA-1088. Where should I keep my medicine? This drug is given in a hospital or clinic and will not be stored at home. NOTE: This sheet is a summary. It may not cover all possible information. If you have questions about this medicine, talk to your doctor, pharmacist, or health care provider.  2019 Elsevier/Gold Standard (2013-11-21 11:10:35)

## 2019-03-02 LAB — CA 125: Cancer Antigen (CA) 125: 13.1 U/mL (ref 0.0–38.1)

## 2019-03-03 ENCOUNTER — Encounter: Payer: Self-pay | Admitting: Gynecologic Oncology

## 2019-03-06 ENCOUNTER — Encounter: Payer: Self-pay | Admitting: Gynecologic Oncology

## 2019-03-06 ENCOUNTER — Telehealth: Payer: Self-pay | Admitting: Gynecologic Oncology

## 2019-03-06 DIAGNOSIS — R935 Abnormal findings on diagnostic imaging of other abdominal regions, including retroperitoneum: Secondary | ICD-10-CM

## 2019-03-06 DIAGNOSIS — Z8041 Family history of malignant neoplasm of ovary: Secondary | ICD-10-CM

## 2019-03-06 NOTE — Telephone Encounter (Signed)
Spoke with patient about [place] results and CA 125 results.  Per Dr. Denman George, her left ovary does not appear perfectly normal but this may be normal for her.  She is recommending a repeat US in three months to compare to and if the left ovary appears the same then we will resume the schedule of repeat US every six months for monitoring. All questions answered.

## 2019-03-07 ENCOUNTER — Encounter: Payer: BC Managed Care – PPO | Admitting: Adult Health

## 2019-03-09 ENCOUNTER — Telehealth: Payer: Self-pay | Admitting: Adult Health

## 2019-03-09 NOTE — Telephone Encounter (Signed)
Called patient regarding upcoming [facility] appointment, patient is notified and e-mail has been sent. °

## 2019-03-15 ENCOUNTER — Encounter: Payer: Self-pay | Admitting: Adult Health

## 2019-03-15 ENCOUNTER — Inpatient Hospital Stay (HOSPITAL_BASED_OUTPATIENT_CLINIC_OR_DEPARTMENT_OTHER): Payer: BC Managed Care – PPO | Admitting: Adult Health

## 2019-03-15 DIAGNOSIS — Z923 Personal history of irradiation: Secondary | ICD-10-CM

## 2019-03-15 DIAGNOSIS — E559 Vitamin D deficiency, unspecified: Secondary | ICD-10-CM | POA: Diagnosis not present

## 2019-03-15 DIAGNOSIS — Z8041 Family history of malignant neoplasm of ovary: Secondary | ICD-10-CM

## 2019-03-15 DIAGNOSIS — G620 Drug-induced polyneuropathy: Secondary | ICD-10-CM | POA: Diagnosis not present

## 2019-03-15 DIAGNOSIS — C50211 Malignant neoplasm of upper-inner quadrant of right female breast: Secondary | ICD-10-CM

## 2019-03-15 DIAGNOSIS — Z9221 Personal history of antineoplastic chemotherapy: Secondary | ICD-10-CM

## 2019-03-15 DIAGNOSIS — Z79811 Long term (current) use of aromatase inhibitors: Secondary | ICD-10-CM

## 2019-03-15 DIAGNOSIS — Z170 Estrogen receptor positive status [ER+]: Secondary | ICD-10-CM

## 2019-03-15 MED ORDER — GABAPENTIN 300 MG PO CAPS: 300.0000 mg | ORAL_CAPSULE | Freq: Every day | ORAL | 4 refills | Status: DC

## 2019-03-15 NOTE — Progress Notes (Signed)
SURVIVORSHIP VIRTUAL VISIT:  I connected with Hannah Woodard on 03/15/19 at  9:00 AM EDT by [facility] and verified that I am speaking with the correct person using two identifiers.   I discussed the limitations, risks, security and privacy concerns of performing an evaluation and management service virtuallyand the availability of in person appointments. I also discussed with the patient that there may be a patient responsible charge related to this service. The patient expressed understanding and agreed to proceed.   BRIEF ONCOLOGIC HISTORY:  Oncology History  Malignant neoplasm of upper-inner quadrant of right breast in female, estrogen receptor positive ([place])  08/27/2017 Initial Diagnosis   [place] woman status post right breast upper inner quadrant biopsy  for a clinical T1c N0 invasive ductal carcinoma, triple positive, with an MIB-1 of 60%         09/10/2017 Genetic Testing   The patient had genetic testing due to a personal history of breast cancer and a family history of ovarian, prostate, skin, and uterine cancer.  The Common Hereditary Cancer Panel + Melanoma Panel was ordered (51 genes total).  The following genes were evaluated for sequence changes and exonic deletions/duplications: APC, ATM, AXIN2, BAP1, BARD1, BMPR1A, BRCA1, BRCA2, BRIP1, CDH1, CDK4, CDKN2A (p14ARF), CDKN2A (p16INK4a), CHEK2, CTNNA1, DICER1, EPCAM*, GREM1*, KIT, MEN1, MLH1, MSH2, MSH3, MSH6, MUTYH, NBN, NF1, PALB2, PDGFRA, PMS2, POLD1, POLE, POT1, PTEN, RAD50, RAD51C, RAD51D, RB1, SDHB, SDHC, SDHD, SMAD4, SMARCA4, STK11, TP53, TSC1, TSC2, VHL. The following genes were evaluated for sequence changes only: HOXB13*, MITF*, NTHL1*, SDHA  Results: Negative, no pathogenic variants identified.  The date of this test report is 09/10/2017.   10/07/2017 Surgery    status post right lumpectomy and sentinel axillary lymph node sampling at [facility] for a pT1C pN0, stage IA invasive ductal carcinoma, grade 3, with  negative margins             (a) 1 sentinel lymph node removed, bilateral oncoplastic breast reduction   11/12/2017 - 11/04/2018 Adjuvant Chemotherapy   adjuvant chemotherapy consisting of paclitaxel weekly x12 and trastuzumab every 21 days STARTING 11/12/2017             (a) paclitaxel discontinued after 7 doses (last dose 12/24/2017) due to neuropathy   02/03/2018 - 03/18/2018 Radiation Therapy   adjuvant radiation Site/dose:The patient initially received a dose of 50.4 Gy in 28 fractions to the breast using whole-breast tangent fields. This was delivered using a 3-D conformal technique. The patient then received a boost to the seroma. This delivered an additional 10 Gy in 5 fractions using 9e electrons with a special teletherapy technique. The total dose was 60.4 Gy.    03/2018 -  Anti-estrogen oral therapy    anastrozole started 03/28/2018             (a) bone density 09/05/2018--normal             (b) patient refuses tamoxifen because of family history of strokes and uterine cancer   goserelin started 09/29/2017, discontinued after 08/05/2018 dose, restarted on 11/04/2018 when her menses resumed     INTERVAL HISTORY:  Hannah Woodard to review her survivorship care plan detailing her treatment course for breast cancer, as well as monitoring long-term side effects of that treatment, education regarding health maintenance, screening, and overall wellness and health promotion.     Overall, Hannah Woodard reports feeling quite well. She continues to receive Goserelin injections every four week and takes Anastrozole daily.  She is tolerating this well.  She has had some recent arthralgias related to lifting her 18 pound cat and carrying an oversized water bottle on her hip during a hike.  She is recovering from these well.  She notes intermittent hot flashes and is taking Gabapentin at night which has helped.  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  It is intermittent and doesn't cause her tremendous pain.  Hannah Woodard notes vaginal dryness with the anastrozole and is using vitamin e suppositories which are helping.    Hannah Woodard was recommended to see GYN-onc for evaluation for oopherectomy.  [facility] went to [facility], and was recommended surgery, however wanted to have ultrasound monitoring initially.  She got a second opinion from Dr. Denman George, who got an ultrasound on 03/01/2019 that demonstrated a ? Left ovary abnormality, and repeat ultrasound is recommended in 3 months.    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  Hannah Woodard notes that at her most recent visit with her PCP she had elevated LDL.   She is working on diet and exercise.  Her last mammogram was in 10/2018 and she is due for breast MRI in 04/2019.  She sees her surgeon, Dr. Elisha Headland every 6 months.    REVIEW OF SYSTEMS:  Review of Systems  Constitutional: Negative for appetite change, chills, fatigue and unexpected weight change.  HENT:   Negative for hearing loss, lump/mass, sore throat and trouble swallowing.   Eyes: Negative for icterus.  Respiratory: Negative for chest tightness, cough and shortness of breath.   Cardiovascular: Negative for chest pain and leg swelling.  Gastrointestinal: Negative for abdominal distention, abdominal pain, constipation, diarrhea, nausea and vomiting.  Endocrine: Positive for hot flashes.  Genitourinary: Negative for difficulty urinating.   Musculoskeletal: Positive for arthralgias.  Skin: Negative for itching and rash.  Neurological: Negative for dizziness, extremity weakness, headaches and numbness.  Hematological: Negative for adenopathy. Does not bruise/bleed easily.  Psychiatric/Behavioral: Negative for confusion and depression. The patient is not nervous/anxious.   Breast: Denies any new nodularity, masses, tenderness, nipple changes, or nipple discharge.       ONCOLOGY TREATMENT TEAM:  1. Surgeon:  Dr. Elisha Headland at [facility] 2. Medical Oncologist: Dr. Jana Hakim  3. Radiation Oncologist: Dr. Lisbeth Renshaw    PAST MEDICAL/SURGICAL HISTORY:  Past Medical History:  Diagnosis Date  . Breast cancer ([place])   . Family history of ovarian cancer 09/02/2017  . Family history of prostate cancer in father 09/02/2017  . Family history of uterine cancer 09/02/2017  . Plantar fasciitis    Past Surgical History:  Procedure Laterality Date  . right lumpectomy, sentinel node biopsy, and bilateral mammoplasty  10/2017   [place]     ALLERGIES:  Allergies  Allergen Reactions  . Adhesive [Tape]   . Milk-Related Compounds Diarrhea    Stomach cramping  . Poison Ivy Extract [Poison Ivy Extract] Hives, Itching and Rash     CURRENT MEDICATIONS:  Outpatient Encounter Medications as of 03/15/2019  Medication Sig  . anastrozole (ARIMIDEX) 1 MG tablet TAKE 1 TABLET BY MOUTH EVERY DAY  . Ascorbic Acid (VITAMIN C PO) Take 400 Units by mouth daily.   Marland Kitchen b complex vitamins tablet Take 1 tablet by mouth daily.  . Cholecalciferol (VITAMIN D3) 5000 units TABS Take 10,000 Units by mouth daily.  . ferrous sulfate 325 (65 FE) MG tablet Take 325 mg by mouth daily with breakfast.  .  fish oil-omega-3 fatty acids 1000 MG capsule Take 1 g by mouth daily.  Marland Kitchen gabapentin (NEURONTIN) 300 MG capsule Take 1 capsule (300 mg total) by mouth at bedtime.  Marland Kitchen OVER THE COUNTER MEDICATION Take 1 tablet by mouth at bedtime as needed (constipation).   . TURMERIC PO Take 1 capsule by mouth as directed.   Marland Kitchen UNABLE TO FIND Take 500 mg by mouth daily. [place] Tail mushroom  . UNABLE TO FIND Take 500 mg by mouth daily. [place] Mushroom  . [DISCONTINUED] prochlorperazine (COMPAZINE) 10 MG tablet Take 1 tablet (10 mg total) by mouth every 6 (six) hours as needed (Nausea or vomiting).   No facility-administered encounter medications on file as of 03/15/2019.      ONCOLOGIC FAMILY  HISTORY:  Family History  Problem Relation Age of Onset  . Ovarian cancer Mother 28  . Prostate cancer Father 25       'high gleason' unsure number  . Ulcerative colitis Sister   . Other Sister        abn ovaian growth, partial hysterectomy  . Uterine cancer Maternal Aunt 52  . Stroke Maternal Aunt 66  . Basal cell carcinoma Brother   . Skin cancer Paternal Uncle   . Skin cancer Maternal Grandmother   . Stroke Paternal Grandfather 68  . Hydrocephalus Cousin      GENETIC COUNSELING/TESTING: negative  SOCIAL HISTORY:  Social History   Socioeconomic History  . Marital status: Married    Spouse name: Not on file  . Number of children: Not on file  . Years of education: Not on file  . Highest education level: Not on file  Occupational History  . Not on file  Social Needs  . Financial resource strain: Not on file  . Food insecurity    Worry: Not on file    Inability: Not on file  . Transportation needs    Medical: Not on file    Non-medical: Not on file  Tobacco Use  . Smoking status: Never Smoker  . Smokeless tobacco: Never Used  Substance and Sexual Activity  . Alcohol use: Yes  . Drug use: No  . Sexual activity: Not on file  Lifestyle  . Physical activity    Days per week: Not on file    Minutes per session: Not on file  . Stress: Not on file  Relationships  . Social Herbalist on phone: Not on file    Gets together: Not on file    Attends religious service: Not on file    Active member of club or organization: Not on file    Attends meetings of clubs or organizations: Not on file    Relationship status: Not on file  . Intimate partner violence    Fear of current or ex partner: Not on file    Emotionally abused: Not on file    Physically abused: Not on file    Forced sexual activity: Not on file  Other Topics Concern  . Not on file  Social History Narrative  . Not on file     OBSERVATIONS/OBJECTIVE:  Patient appears well.  No apparent  distress.  Skin is intact without rash or lesion.  Breathing non labored.  Mood and behavior are normal.  Neuro non focal.   LABORATORY DATA:  None for this visit.  DIAGNOSTIC IMAGING:  None for this visit.      ASSESSMENT AND PLAN:  Ms.. Hansell is a pleasant 48 y.o. female with  Stage IA right breast invasive ductal carcinoma, ER+/PR+/HER2+, diagnosed in 07/2017, treated with lumpectomy, adjuvant chemotherapy, maintenance trastuzumab, adjuvant radiation therapy, and anti-estrogen therapy with Anastrozole (Zoladex started 09/2017) beginning in 03/2018.  She presents to the Survivorship Clinic for our initial meeting and routine follow-up post-completion of treatment for breast cancer.    1. Stage IA right breast cancer:  Ms. Fleites is continuing to recover from definitive treatment for breast cancer. She will follow-up with her medical oncologist, Dr. Jana Hakim in 03/2019 with history and physical exam per surveillance protocol.  She will continue her anti-estrogen therapy with Anastrozole and Goserelin injections monthly. Thus far, she is tolerating the Anastrozole well, with minimal side effects.   Today, a comprehensive survivorship care plan and treatment summary was reviewed with the patient today detailing her breast cancer diagnosis, treatment course, potential late/long-term effects of treatment, appropriate follow-up care with recommendations for the future, and patient education resources.  A copy of this summary, along with a letter will be sent to the patient's primary care provider via mail/fax/In Basket message after today's visit.    2. Bone health:  Given Ms. Montufar's age/history of breast cancer and her current treatment regimen including anti-estrogen therapy with Anastrozole, she is at risk for bone demineralization.  Her last DEXA scan was 08/2018, which showed normal bone density with T score of -0.7.  She will be due for repeat bone density testing in 08/2020.  In the meantime,  she was encouraged to increase her consumption of foods rich in calcium, as well as increase her weight-bearing activities.  She was given education on specific activities to promote bone health.  3. Cancer screening:  Due to Ms. Rote's history and her age, she should receive screening for skin cancers, colon cancer, and gynecologic cancers.  The information and recommendations are listed on the patient's comprehensive care plan/treatment summary and were reviewed in detail with the patient.    4. Health maintenance and wellness promotion: Ms. Parzych was encouraged to consume 5-7 servings of fruits and vegetables per day. We reviewed the "Nutrition Rainbow" handout, as well as the handout "Take Control of Your Health and Reduce Your Cancer Risk" from the [facility].  She was also encouraged to engage in moderate to vigorous exercise for 30 minutes per day most days of the week. We discussed the [employer] fitness program, which is designed for cancer survivors to help them become more physically fit after cancer treatments.  She was instructed to limit her alcohol consumption and continue to abstain from tobacco use.     5. Support services/counseling: It is not uncommon for this period of the patient's cancer care trajectory to be one of many emotions and stressors.  We discussed how this can be increasingly difficult during the times of quarantine and social distancing due to the COVID-19 pandemic.   She was given information regarding our available services and encouraged to contact me with any questions or for help enrolling in any of our support group/programs.    Follow up instructions:    -Return to cancer center 03/2019 for f/u with GM  -Mammogram due in 10/2019 -Breast MRI 04/2019 -Follow up with Dr. Elisha Headland in 05/2019 -She is welcome to return back to the Survivorship Clinic at any time; no additional follow-up needed at this time.  -Consider referral back to  survivorship as a long-term survivor for continued surveillance  The patient was provided an opportunity to ask questions and all were answered. The patient agreed  with the plan and demonstrated an understanding of the instructions.   The patient was advised to call back or seek an in-person evaluation if the symptoms worsen or if the condition fails to improve as anticipated.   I provided 55 minutes of face-to-face video visit time during this encounter, and > 50% was spent counseling as documented under my assessment & plan.  Scot Dock, NP

## 2019-03-17 ENCOUNTER — Telehealth: Payer: Self-pay | Admitting: Adult Health

## 2019-03-17 NOTE — Telephone Encounter (Signed)
I talk with patient regarding schedule  

## 2019-03-20 ENCOUNTER — Encounter: Payer: Self-pay | Admitting: Adult Health

## 2019-03-20 ENCOUNTER — Telehealth: Payer: Self-pay | Admitting: *Deleted

## 2019-03-20 ENCOUNTER — Ambulatory Visit (INDEPENDENT_AMBULATORY_CARE_PROVIDER_SITE_OTHER): Payer: BC Managed Care – PPO | Admitting: Adult Health

## 2019-03-20 ENCOUNTER — Other Ambulatory Visit: Payer: Self-pay

## 2019-03-20 VITALS — BP 122/76 | HR 78 | Temp 97.5°F | Ht 66.0 in | Wt 151.0 lb

## 2019-03-20 DIAGNOSIS — H60531 Acute contact otitis externa, right ear: Secondary | ICD-10-CM

## 2019-03-20 MED ORDER — HYDROCORTISONE-ACETIC ACID 1-2 % OT SOLN: 3.0000 [drp] | Freq: Two times a day (BID) | OTIC | 1 refills | Status: DC

## 2019-03-20 NOTE — Telephone Encounter (Signed)
Received order for BCI testing per Mendel Ryder, NP. Requisition faxed to [facility]

## 2019-03-20 NOTE — Patient Instructions (Signed)
Acetic Acid; Hydrocortisone Ear Solution What is this medicine? ACETIC ACID; HYDROCORTISONE (a SEE tik AS id; hye droe KOR ti sone) is used to treat outer ear infections. This medicine may be used for other purposes; ask your health care provider or pharmacist if you have questions. COMMON BRAND NAME(S): Acetasol HC, Otomycet-HC, VoSoL HC What should I tell my health care provider before I take this medicine? They need to know if you have any of these conditions: -herpes infection -ruptured ear drum -vaccinia (the skin blister that occurs after a smallpox vaccine) -an unusual or allergic reaction to acetic acid, hydrocortisone, propylene glycol, other medicines, foods, dyes, or preservatives -pregnant or trying to get pregnant -breast-feeding How should I use this medicine? This medicine is only for use in your ears. Follow the directions on the prescription label. Wash your hands with soap and water. First, carefully clean your ear(s) with a dry cotton swab. Gently warm the bottle by holding it in the hand for 1 to 2 minutes. Use the ear solution as directed by your doctor or health care professional. Shanda Howells down on your side with the affected ear up. Try not to touch the tip of the dropper to your ear, fingertips, or other surface. Squeeze the bottle gently to put the prescribed number of drops in the ear canal. Stay in this position for 30 to 60 seconds to help the drops soak into the ear. Repeat, if necessary, for the opposite ear. Do not use your medicine more often than directed. Finish the full course of medicine prescribed by your health care professional even if you think your condition is better. Talk to your pediatrician regarding the use of this medicine in children. While this drug may be prescribed for children as young as 14 years of age and older for selected conditions, precautions do apply. Overdosage: If you think you have taken too much of this medicine contact a poison control center  or emergency room at once. NOTE: This medicine is only for you. Do not share this medicine with others. What if I miss a dose? If you miss a dose, use it as soon as you can. If it is almost time for your next dose, use only that dose. Do not use double or extra doses. What may interact with this medicine? Interactions are not expected. Do not use other ear products without talking to your doctor or health care professional. This list may not describe all possible interactions. Give your health care provider a list of all the medicines, herbs, non-prescription drugs, or dietary supplements you use. Also tell them if you smoke, drink alcohol, or use illegal drugs. Some items may interact with your medicine. What should I watch for while using this medicine? Tell your doctor or health care professional if your ear infection does not get better in a few days. If a rash or allergic reaction occurs, stop using this product right away and contact your doctor or health care professional. It is important that you keep the infected ear(s) clean and dry. When bathing, try not to get the infected ear(s) wet. Do not go swimming unless your doctor or health care professional has told you otherwise. To prevent the spread of infection, do not share ear products or share towels and washcloths with anyone else. What side effects may I notice from receiving this medicine? Side effects that you should report to your doctor or health care professional as soon as possible: -allergic reactions like skin rash, itching or  hives, swelling of the face, lips, or tongue -burning and redness -worsening ear pain Side effects that usually do not require medical attention (report to your doctor or health care professional if they continue or are bothersome): -unpleasant feeling while putting the drops in the ear This list may not describe all possible side effects. Call your doctor for medical advice about side effects. You may  report side effects to FDA at 1-800-FDA-1088. Where should I keep my medicine? Keep out of the reach of children. Store at room temperature between 15 and 30 degrees C (59 and 86 degrees F). Do not freeze. Protect from light. Throw away any unused medicine after the expiration date. NOTE: This sheet is a summary. It may not cover all possible information. If you have questions about this medicine, talk to your doctor, pharmacist, or health care provider.  2019 Elsevier/Gold Standard (2007-12-08 10:56:41)

## 2019-03-20 NOTE — Progress Notes (Signed)
Assessment and Plan:  Hannah Woodard was seen today for ear pain.  Diagnoses and all orders for this visit:  Acute contact otitis externa of right ear - stop using Qtips, use OTC drops/oil in the future Follow up if having fever, increased discharge, change in hearing, headache Tylenol PRN pain -     acetic acid-hydrocortisone (VOSOL-HC) OTIC solution; Place 3 drops into the right ear 2 (two) times daily.  Further disposition pending results of labs. Discussed med's effects and SE's.   Over 15 minutes of exam, counseling, chart review, and critical decision making was performed.   Future Appointments  Date Time Provider [place]  03/29/2019  3:45 PM [place] [facility] None  04/26/2019  2:45 PM [facility] LAB 5 [facility] None  04/26/2019  3:15 PM [facility] [place] [facility] None  04/26/2019  3:30 PM Magrinat, Virgie Dad, MD [facility] None  05/24/2019  3:45 PM [facility] [place] [facility] None  06/06/2019 10:00 AM [facility] 1 [facility] [place]  06/21/2019  3:45 PM [facility] [place] [facility] None  12/27/2019  2:00 PM Vicie Mutters, PA-C [facility] None    ------------------------------------------------------------------------------------------------------------------   HPI BP 122/76   Pulse 78   Temp (!) 97.5 F (36.4 C)   Ht 5' 6"  (1.676 m)   Wt 151 lb (68.5 kg)   LMP 10/05/2017   SpO2 98%   BMI 24.37 kg/m   48 y.o.female presents for evaluation of R external ear pain; she reports she was scratching in her ear canal a few days ago due to itching, feels she may have caused an abrasion and infection; she reports has had discomfort and achy pain for the last 3-4 days, mildly improved today. She reports clear yellow fluid on q tip. Describes pain as non-radiating, 3-4/10.  Denies headache, fever/chills, dizziness, changes in hearing.   Past Medical History:  Diagnosis Date  . Breast cancer ([place])   . Family history of ovarian cancer 09/02/2017  . Family history  of prostate cancer in father 09/02/2017  . Family history of uterine cancer 09/02/2017  . Plantar fasciitis      Allergies  Allergen Reactions  . Adhesive [Tape]   . Milk-Related Compounds Diarrhea    Stomach cramping  . Poison Ivy Extract [Poison Ivy Extract] Hives, Itching and Rash    Current Outpatient Medications on File Prior to Visit  Medication Sig  . anastrozole (ARIMIDEX) 1 MG tablet TAKE 1 TABLET BY MOUTH EVERY DAY  . Ascorbic Acid (VITAMIN C PO) Take 400 Units by mouth daily.   Marland Kitchen b complex vitamins tablet Take 1 tablet by mouth daily.  . Cholecalciferol (VITAMIN D3) 5000 units TABS Take 10,000 Units by mouth daily.  . COLLAGEN PO Take by mouth daily. Takes beef collagen  . ferrous sulfate 325 (65 FE) MG tablet Take 325 mg by mouth daily with breakfast.  . fish oil-omega-3 fatty acids 1000 MG capsule Take 1 g by mouth daily.  Marland Kitchen gabapentin (NEURONTIN) 300 MG capsule Take 1 capsule (300 mg total) by mouth at bedtime.  . Magnesium Citrate POWD by Does not apply route daily.  Marland Kitchen OVER THE COUNTER MEDICATION Take 1 tablet by mouth at bedtime as needed (constipation).   . TURMERIC PO Take 1 capsule by mouth as directed.   Marland Kitchen UNABLE TO FIND Take 500 mg by mouth daily. [place] T[place] mushroom  . UNABLE TO FIND Take 500 mg by mouth daily. [place] Mushroom  . Zinc 30 MG TABS Take by mouth daily.  . [  DISCONTINUED] prochlorperazine (COMPAZINE) 10 MG tablet Take 1 tablet (10 mg total) by mouth every 6 (six) hours as needed (Nausea or vomiting).   No current facility-administered medications on file prior to visit.     ROS: all negative except above.   Physical Exam:  Ht 5' 6"  (1.676 m)   Wt 151 lb (68.5 kg)   LMP 10/05/2017   BMI 24.37 kg/m   General Appearance: Well nourished, in no apparent distress. Eyes: PERRLA, conjunctiva no swelling or erythema Sinuses: No Frontal/maxillary tenderness ENT/Mouth: Ext aud canals clear, R canal erythematous without notable discharge, lesions,  wounds, maceration, narrowing. TMs without erythema, bulging. R ext auricle tender with palptation, mastoid non-tender. No erythema, swelling, or exudate on post pharynx.  Tonsils not swollen or erythematous. Hearing normal.  Neck: Supple Respiratory: Respiratory effort normal Lymphatics: Non tender without lymphadenopathy.  Musculoskeletal: normal gait.  Skin: Warm, dry without rashes, lesions, ecchymosis.  Neuro: Cranial nerves intact. Normal muscle tone Psych: Awake and oriented X 3, normal affect, Insight and Judgment appropriate.     Izora Ribas, NP 1:59 PM [facility]

## 2019-03-21 ENCOUNTER — Other Ambulatory Visit: Payer: Self-pay | Admitting: Oncology

## 2019-03-26 IMAGING — CR DG CHEST 2V
2 series · 2 of 2 positions shown · non-contrast
Comparison: None.

CLINICAL DATA: Sore throat and cough.  Denies fever and chills.

EXAM:
CHEST - 2 VIEW

[w chest pa]
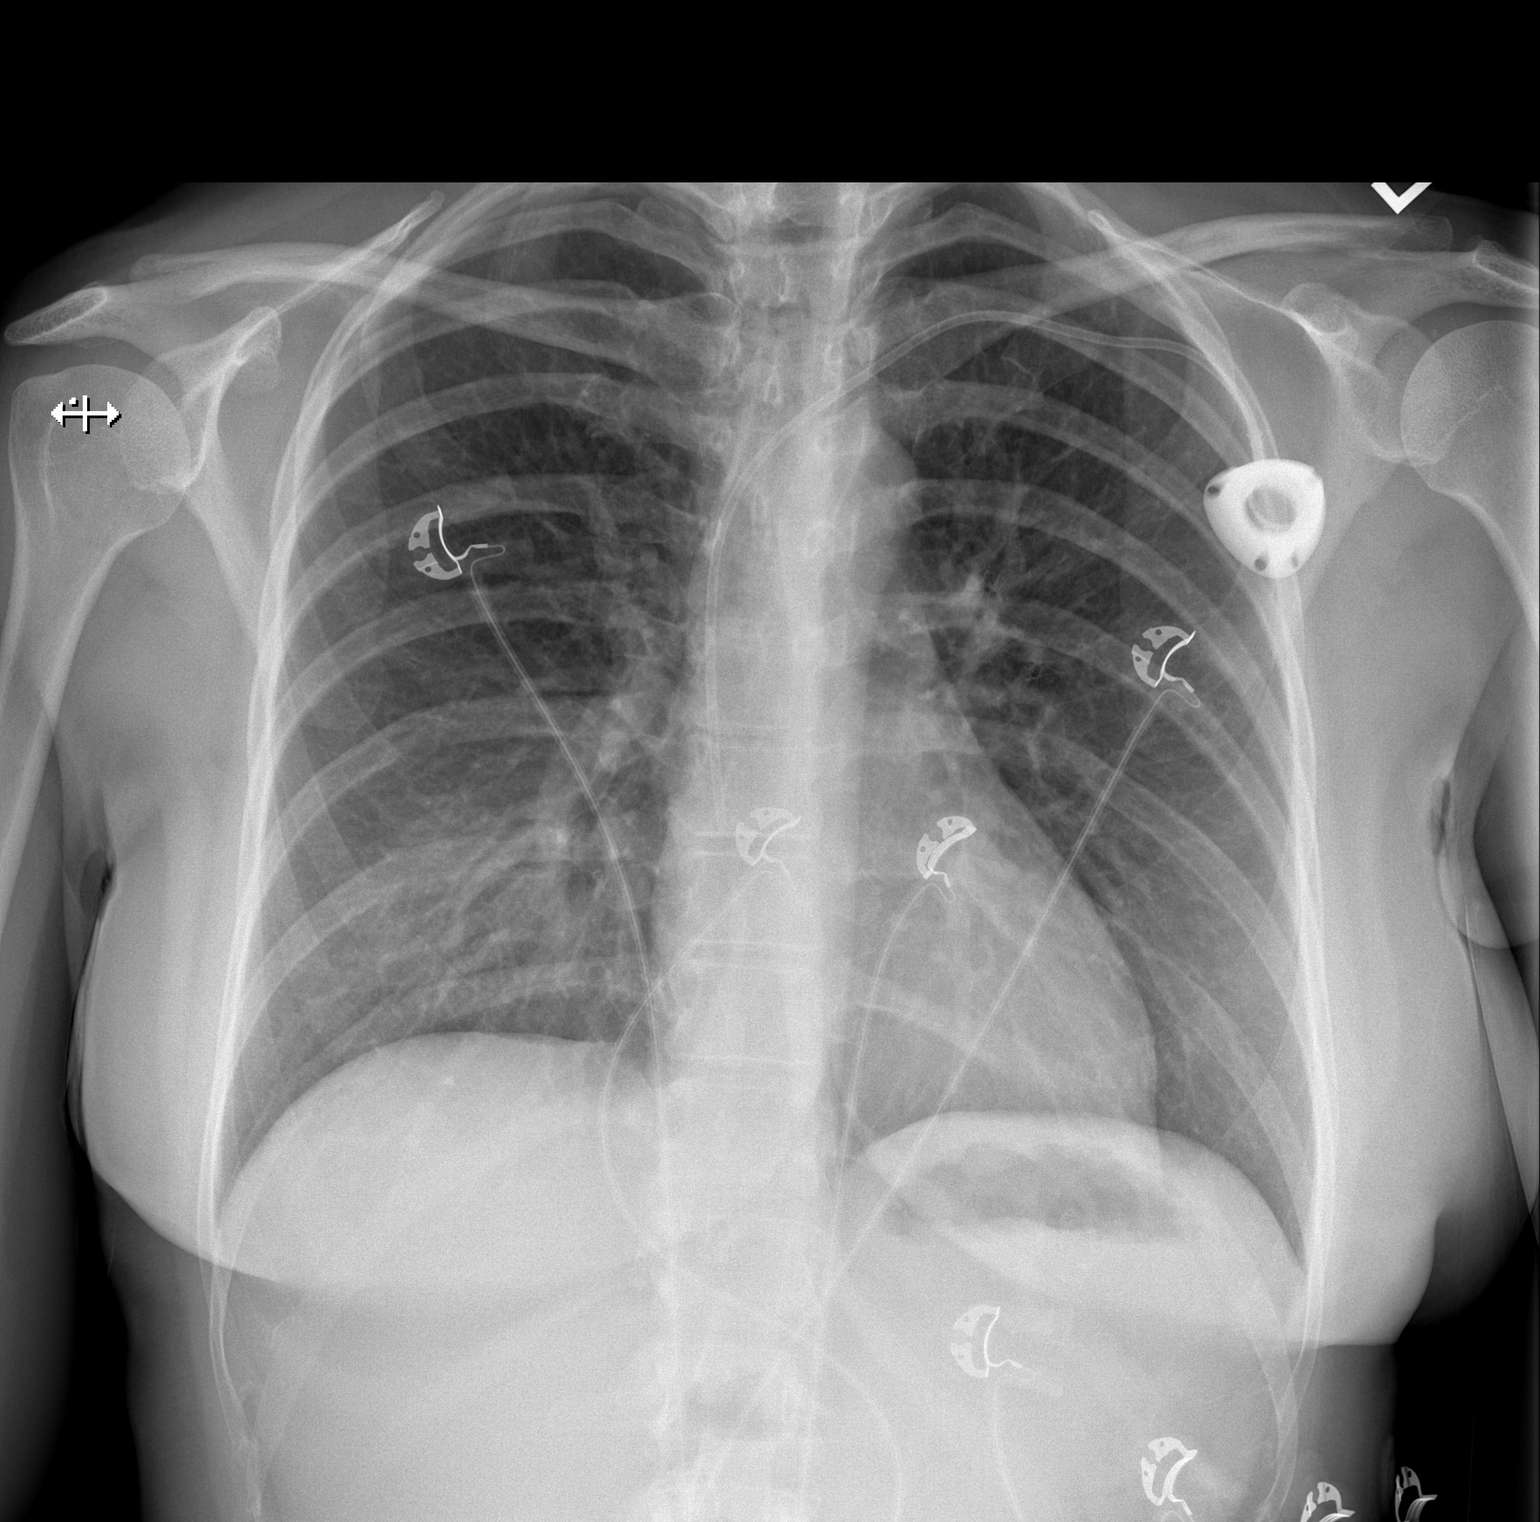

[w chest lat]
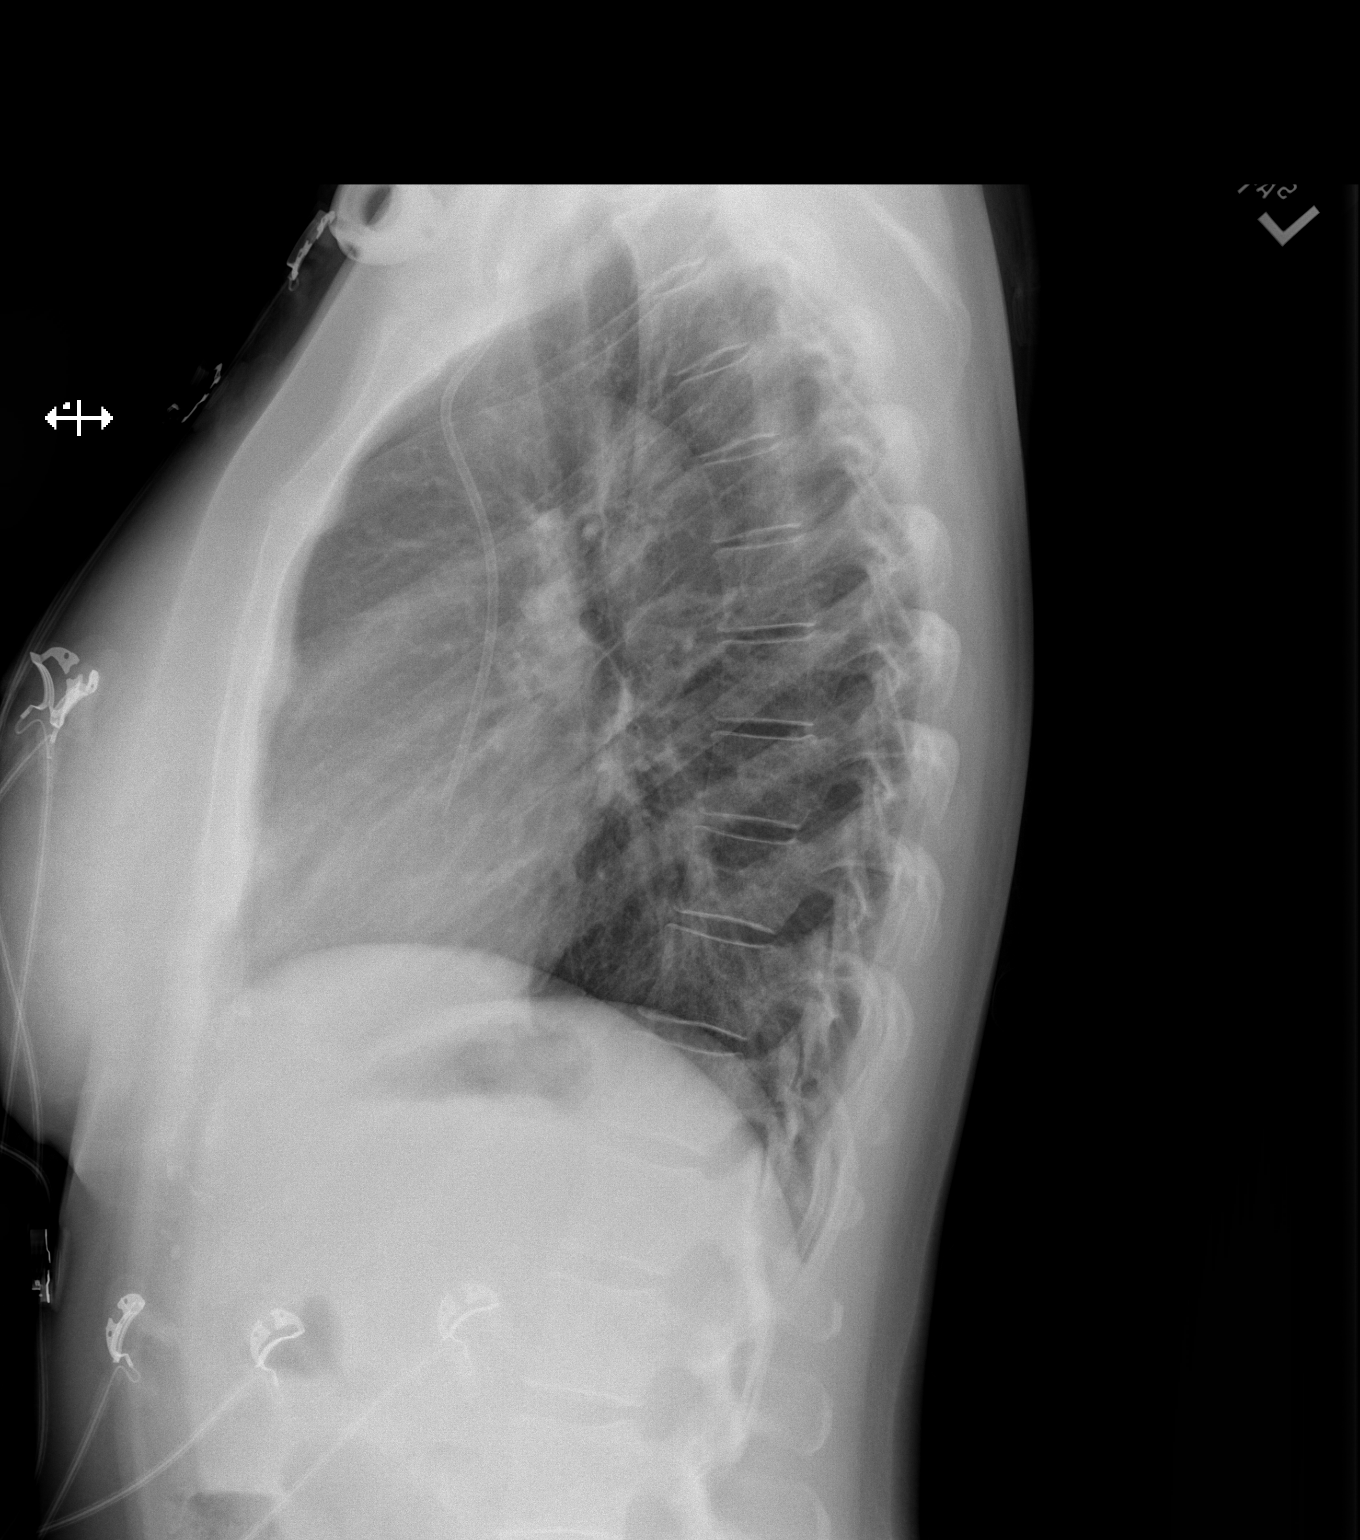

[2 of 2 positions shown; findings below may reference images not displayed]

FINDINGS: The heart size and mediastinal contours are within normal limits.
Port catheter tip noted in the distal SVC. Faint opacity at the
right lung base seen only on the frontal view is likely due to
superimposition of overlapping breast tissue. No infiltrate seen on
the lateral projection. No pulmonary edema, effusion or
pneumothorax. The visualized skeletal structures are unremarkable.
IMPRESSION: No active cardiopulmonary disease.

## 2019-03-29 ENCOUNTER — Inpatient Hospital Stay: Payer: BC Managed Care – PPO | Attending: Oncology

## 2019-03-29 ENCOUNTER — Other Ambulatory Visit: Payer: Self-pay

## 2019-03-29 VITALS — BP 128/88 | HR 95 | Temp 98.3°F | Resp 18

## 2019-03-29 DIAGNOSIS — M2550 Pain in unspecified joint: Secondary | ICD-10-CM | POA: Diagnosis not present

## 2019-03-29 DIAGNOSIS — Z808 Family history of malignant neoplasm of other organs or systems: Secondary | ICD-10-CM | POA: Diagnosis not present

## 2019-03-29 DIAGNOSIS — Z79899 Other long term (current) drug therapy: Secondary | ICD-10-CM | POA: Diagnosis not present

## 2019-03-29 DIAGNOSIS — Z79811 Long term (current) use of aromatase inhibitors: Secondary | ICD-10-CM | POA: Insufficient documentation

## 2019-03-29 DIAGNOSIS — M25559 Pain in unspecified hip: Secondary | ICD-10-CM | POA: Diagnosis not present

## 2019-03-29 DIAGNOSIS — Z8041 Family history of malignant neoplasm of ovary: Secondary | ICD-10-CM | POA: Diagnosis not present

## 2019-03-29 DIAGNOSIS — C50211 Malignant neoplasm of upper-inner quadrant of right female breast: Secondary | ICD-10-CM | POA: Insufficient documentation

## 2019-03-29 DIAGNOSIS — Z95828 Presence of other vascular implants and grafts: Secondary | ICD-10-CM

## 2019-03-29 DIAGNOSIS — Z8042 Family history of malignant neoplasm of prostate: Secondary | ICD-10-CM | POA: Diagnosis not present

## 2019-03-29 DIAGNOSIS — Z923 Personal history of irradiation: Secondary | ICD-10-CM | POA: Diagnosis not present

## 2019-03-29 DIAGNOSIS — Z170 Estrogen receptor positive status [ER+]: Secondary | ICD-10-CM | POA: Insufficient documentation

## 2019-03-29 DIAGNOSIS — Z9221 Personal history of antineoplastic chemotherapy: Secondary | ICD-10-CM | POA: Insufficient documentation

## 2019-03-29 DIAGNOSIS — G629 Polyneuropathy, unspecified: Secondary | ICD-10-CM | POA: Diagnosis not present

## 2019-03-29 DIAGNOSIS — Z9225 Personal history of immunosupression therapy: Secondary | ICD-10-CM | POA: Insufficient documentation

## 2019-03-29 MED ORDER — GOSERELIN ACETATE 3.6 MG ~~LOC~~ IMPL
3.6000 mg | DRUG_IMPLANT | Freq: Once | SUBCUTANEOUS | Status: AC
  Administered 2019-03-29: 3.6 mg via SUBCUTANEOUS

## 2019-03-29 MED ORDER — GOSERELIN ACETATE 3.6 MG ~~LOC~~ IMPL
DRUG_IMPLANT | SUBCUTANEOUS | Status: AC
  Filled 2019-03-29: qty 3.6

## 2019-03-29 NOTE — Patient Instructions (Signed)

## 2019-04-25 ENCOUNTER — Other Ambulatory Visit: Payer: Self-pay | Admitting: *Deleted

## 2019-04-25 DIAGNOSIS — C50211 Malignant neoplasm of upper-inner quadrant of right female breast: Secondary | ICD-10-CM

## 2019-04-25 DIAGNOSIS — Z170 Estrogen receptor positive status [ER+]: Secondary | ICD-10-CM

## 2019-04-25 NOTE — Progress Notes (Signed)
[place]  Telephone:([phone]) [phone] Fax:(336) [phone]     ID: ZONDRA LAWLOR DOB: 1971-01-21  MR#: [id number]  TSV#:[id number]  Patient Care Team: Unk Pinto, MD as PCP - General (Internal Medicine) Jovita Kussmaul, MD as Consulting Physician (General Surgery) Tanyla Stege, Virgie Dad, MD as Consulting Physician (Oncology) Kyung Rudd, MD as Consulting Physician (Radiation Oncology) Lorelle Gibbs, MD (Radiology) Angelina Ok, MD as Referring Physician (Surgery) Everitt Amber, MD as Consulting Physician (Gynecologic Oncology) OTHER MD:  CHIEF COMPLAINT: Triple positive breast cancer  CURRENT TREATMENT: anastrozole, Zoladex   INTERVAL HISTORY: [name] returns today for follow-up of her triple positive breast cancer.   She continues on anastrozole, which she is tolerating well. She reports some joint aches.  She receives Zoladex every 4 weeks.  Her most recent bone density was on 09/05/2018 at [facility] and showed a T-score of -0.7.  Since her last visit, she underwent bilateral diagnostic mammography with tomography at [facility] on 11/09/2018 showing: breast density = scattered fibroglandular density; no evidence of malignancy in either breast; right lumpectomy bed is incompletely imaged secondary to far posterior location, patient may benefit from annual breast MRI.  She also underwent transabdominal and transvaginal ultrasounds on 03/01/2019. These showed: unchanged 8 mm calcified leiomyoma at uterine fundus; heterogeneous appearing left ovary, without definite mass though it is difficult to completely exclude a subtle nodule centrally. She is scheduled for repeat pelvic ultrasound on 06/06/2019.    REVIEW OF SYSTEMS: [name] reports breast pain to both sides and hip pain. 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 She states she uses lavender to her scar. She goes shopping every 2  weeks. She goes out to do things she enjoys. She has been off work since school let out. She expressed concern over where she will work this fall. She has not been exercising recently. If she does exercise, she walks for about 20 minutes at a time. A detailed review of systems was otherwise entirely negative.   HISTORY OF CURRENT ILLNESS: From the original intake note:  The patient has a history of cysts and nodules in the right breast which have been closely followed, with exams 07/30/2016 and 01/28/2017.  On 07/29/2017 follow-up right breast ultrasonography showed no findings of concern.  Bilateral diagnostic mammography 08/18/2017 with right breast ultrasonography on 08/18/2017 at [facility] found the breast density to be category C.  There was now a new irregular high density mass in the posterior portion of the right breast seen on the mediolateral oblique view only.  Ultrasonography confirmed a 1.4 cm lobulated mass in the upper inner quadrant of the right breast 10 cm from the nipple associated with pectoral muscle invasion.  The right axilla was sonographically benign.  Biopsy of this mass 08/24/2017 showed ([id number]) invasive ductal carcinoma, grade 2, estrogen receptor 95% positive, progesterone receptor 100% positive, both with strong staining intensity, with an MIB-1 of 60%, and HER-2 amplified, with a signals ratio of 2.84 (full report pending).  The patient's subsequent history is as detailed below.   PAST MEDICAL HISTORY: Past Medical History:  Diagnosis Date  . Breast cancer ([place])   . Family history of ovarian cancer 09/02/2017  . Family history of prostate cancer in father 09/02/2017  . Family history of uterine cancer 09/02/2017  . Plantar fasciitis     PAST SURGICAL HISTORY: Past Surgical History:  Procedure Laterality Date  . right lumpectomy, sentinel  node biopsy, and bilateral mammoplasty  10/2017   [facility]    FAMILY HISTORY Family History  Problem Relation Age of  Onset  . Ovarian cancer Mother 39  . Prostate cancer Father 62       'high gleason' unsure number  . Ulcerative colitis Sister   . Other Sister        abn ovaian growth, partial hysterectomy  . Uterine cancer Maternal Aunt 92  . Stroke Maternal Aunt 66  . Basal cell carcinoma Brother   . Skin cancer Paternal Uncle   . Skin cancer Maternal Grandmother   . Stroke Paternal Grandfather 39  . Hydrocephalus Cousin   The patient's father died from alcoholic cirrhosis at age 46.  He had been diagnosed with prostate cancer at age 46.  The patient's mother was diagnosed with ovarian cancer at age 46 and died a year later.  The patient has 1 brother, 1 sister.  The patient's brother has had basal cell and other skin cancers.  A paternal aunt had uterine cancer.  Other relatives have had skin cancers but the patient does not know if these were melanomas or not   GYNECOLOGIC HISTORY:  Patient's last menstrual period was 10/05/2017.  Menarche age 48, the patient has never been pregnant.  She is still having regular periods.  She used oral contraceptives briefly in the past without complications. She and her husband are very interested in having children, and currently (December 2018) they are not using any contraception.   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  Her husband Rodman Key (goes by [employer]") is a Dealer.  At home it's just them, a [employer] and a cat.   ADVANCED DIRECTIVES: Not in place   HEALTH MAINTENANCE: Social History   Tobacco Use  . Smoking status: Never Smoker  . Smokeless tobacco: Never Used  Substance Use Topics  . Alcohol use: Yes  . Drug use: No     Colonoscopy: Never  PAP:  Bone density: Never   Allergies  Allergen Reactions  . Adhesive [Tape]   . Milk-Related Compounds Diarrhea    Stomach cramping  . Poison Ivy Extract [Poison Ivy Extract] Hives, Itching and Rash    Current  Outpatient Medications  Medication Sig Dispense Refill  . acetic acid-hydrocortisone (VOSOL-HC) OTIC solution Place 3 drops into the right ear 2 (two) times daily. 10 mL 1  . anastrozole (ARIMIDEX) 1 MG tablet TAKE 1 TABLET BY MOUTH EVERY DAY 90 tablet 2  . Ascorbic Acid (VITAMIN C PO) Take 400 Units by mouth daily.     Marland Kitchen b complex vitamins tablet Take 1 tablet by mouth daily.    . Cholecalciferol (VITAMIN D3) 5000 units TABS Take 10,000 Units by mouth daily.    . COLLAGEN PO Take by mouth daily. Takes beef collagen    . ferrous sulfate 325 (65 FE) MG tablet Take 325 mg by mouth daily with breakfast.    . fish oil-omega-3 fatty acids 1000 MG capsule Take 1 g by mouth daily.    Marland Kitchen gabapentin (NEURONTIN) 300 MG capsule Take 1 capsule (300 mg total) by mouth at bedtime. 90 capsule 4  . ketoconazole (NIZORAL) 2 % cream APPLY TO AFFECTED AREA EVERY DAY 15 g 0  . Magnesium Citrate POWD by Does not apply route daily.    Marland Kitchen OVER THE COUNTER MEDICATION Take 1 tablet by mouth at bedtime as needed (constipation).     Marland Kitchen  TURMERIC PO Take 1 capsule by mouth as directed.     Marland Kitchen UNABLE TO FIND Take 500 mg by mouth daily. [place] mushroom    . UNABLE TO FIND Take 500 mg by mouth daily. [place] Mushroom    . Zinc 30 MG TABS Take by mouth daily.     No current facility-administered medications for this visit.     OBJECTIVE: Young white woman in no acute distress  Vitals:   04/26/19 1529  BP: 121/81  Pulse: 84  Resp: 18  Temp: (!) 97.1 F (36.2 C)  SpO2: 100%     Body mass index is 23.76 kg/m.   Wt Readings from Last 3 Encounters:  04/26/19 147 lb 3.2 oz (66.8 kg)  03/20/19 151 lb (68.5 kg)  12/27/18 153 lb 3.2 oz (69.5 kg)   ECOG FS:1 - Symptomatic but completely ambulatory  Sclerae unicteric, EOMs intact Wearing a mask No cervical or supraclavicular adenopathy Lungs no rales or rhonchi Heart regular rate and rhythm Abd soft, nontender, positive bowel sounds MSK no focal spinal  tenderness, no upper extremity lymphedema Neuro: nonfocal, well oriented, appropriate affect Breasts: Status post bilateral reduction mammoplasty; status post right lumpectomy and radiation with no evidence of local recurrence; both axillae are benign.   LAB RESULTS:  CMP     Component Value Date/Time   NA 144 04/26/2019 1459   NA 138 09/01/2017 1238   K 4.5 04/26/2019 1459   K 4.4 09/01/2017 1238   CL 104 04/26/2019 1459   CO2 31 04/26/2019 1459   CO2 25 09/01/2017 1238   GLUCOSE 106 (H) 04/26/2019 1459   GLUCOSE 83 09/01/2017 1238   BUN 11 04/26/2019 1459   BUN 9.2 09/01/2017 1238   CREATININE 0.69 04/26/2019 1459   CREATININE 0.51 12/27/2018 1448   CREATININE 0.7 09/01/2017 1238   CALCIUM 9.6 04/26/2019 1459   CALCIUM 9.4 09/01/2017 1238   PROT 6.9 04/26/2019 1459   PROT 7.4 09/01/2017 1238   ALBUMIN 4.3 04/26/2019 1459   ALBUMIN 4.2 09/01/2017 1238   AST 23 04/26/2019 1459   AST 21 09/01/2017 1238   ALT 26 04/26/2019 1459   ALT 21 09/01/2017 1238   ALKPHOS 93 04/26/2019 1459   ALKPHOS 60 09/01/2017 1238   BILITOT 0.8 04/26/2019 1459   BILITOT 0.65 09/01/2017 1238   GFRNONAA >60 04/26/2019 1459   GFRNONAA 116 12/27/2018 1448   GFRAA >60 04/26/2019 1459   GFRAA 135 12/27/2018 1448    No results found for: Ronnald Ramp, A1GS, A2GS, BETS, BETA2SER, GAMS, MSPIKE, SPEI  No results found for: Nils Pyle, [facility]  Lab Results  Component Value Date   WBC 4.7 04/26/2019   NEUTROABS 3.2 04/26/2019   HGB 13.5 04/26/2019   HCT 38.9 04/26/2019   MCV 97.3 04/26/2019   PLT 193 04/26/2019      Chemistry      Component Value Date/Time   NA 144 04/26/2019 1459   NA 138 09/01/2017 1238   K 4.5 04/26/2019 1459   K 4.4 09/01/2017 1238   CL 104 04/26/2019 1459   CO2 31 04/26/2019 1459   CO2 25 09/01/2017 1238   BUN 11 04/26/2019 1459   BUN 9.2 09/01/2017 1238   CREATININE 0.69 04/26/2019 1459   CREATININE 0.51 12/27/2018 1448    CREATININE 0.7 09/01/2017 1238      Component Value Date/Time   CALCIUM 9.6 04/26/2019 1459   CALCIUM 9.4 09/01/2017 1238   ALKPHOS 93 04/26/2019 1459   ALKPHOS  60 09/01/2017 1238   AST 23 04/26/2019 1459   AST 21 09/01/2017 1238   ALT 26 04/26/2019 1459   ALT 21 09/01/2017 1238   BILITOT 0.8 04/26/2019 1459   BILITOT 0.65 09/01/2017 1238       No results found for: LABCA2  No components found for: LSLHTD428  No results for input(s): INR in the last 168 hours.  No results found for: LABCA2  No results found for: CAN199  Lab Results  Component Value Date   CAN125 13.1 03/01/2019    No results found for: JGO115  No results found for: CA2729  No components found for: HGQUANT  No results found for: CEA1 / No results found for: CEA1   No results found for: AFPTUMOR  No results found for: CHROMOGRNA  No results found for: PSA1  Appointment on 04/26/2019  Component Date Value Ref Range Status  . Sodium 04/26/2019 144  135 - 145 mmol/L Final  . Potassium 04/26/2019 4.5  3.5 - 5.1 mmol/L Final  . Chloride 04/26/2019 104  98 - 111 mmol/L Final  . CO2 04/26/2019 31  22 - 32 mmol/L Final  . Glucose, Bld 04/26/2019 106* 70 - 99 mg/dL Final  . BUN 04/26/2019 11  6 - 20 mg/dL Final  . Creatinine 04/26/2019 0.69  0.44 - 1.00 mg/dL Final  . Calcium 04/26/2019 9.6  8.9 - 10.3 mg/dL Final  . Total Protein 04/26/2019 6.9  6.5 - 8.1 g/dL Final  . Albumin 04/26/2019 4.3  3.5 - 5.0 g/dL Final  . AST 04/26/2019 23  15 - 41 U/L Final  . ALT 04/26/2019 26  0 - 44 U/L Final  . Alkaline Phosphatase 04/26/2019 93  38 - 126 U/L Final  . Total Bilirubin 04/26/2019 0.8  0.3 - 1.2 mg/dL Final  . GFR, Est Non Af Am 04/26/2019 >60  >60 mL/min Final  . GFR, Est AFR Am 04/26/2019 >60  >60 mL/min Final  . Anion gap 04/26/2019 9  5 - 15 Final   Performed at [facility], [place] [address], [place], [place] [address]  . WBC Count 04/26/2019 4.7  4.0 - 10.5 K/uL Final   . RBC 04/26/2019 4.00  3.87 - 5.11 MIL/uL Final  . Hemoglobin 04/26/2019 13.5  12.0 - 15.0 g/dL Final  . HCT 04/26/2019 38.9  36.0 - 46.0 % Final  . MCV 04/26/2019 97.3  80.0 - 100.0 fL Final  . MCH 04/26/2019 33.8  26.0 - 34.0 pg Final  . MCHC 04/26/2019 34.7  30.0 - 36.0 g/dL Final  . RDW 04/26/2019 12.9  11.5 - 15.5 % Final  . Platelet Count 04/26/2019 193  150 - 400 K/uL Final  . nRBC 04/26/2019 0.0  0.0 - 0.2 % Final  . Neutrophils Relative % 04/26/2019 68  % Final  . Neutro Abs 04/26/2019 3.2  1.7 - 7.7 K/uL Final  . Lymphocytes Relative 04/26/2019 21  % Final  . Lymphs Abs 04/26/2019 1.0  0.7 - 4.0 K/uL Final  . Monocytes Relative 04/26/2019 8  % Final  . Monocytes Absolute 04/26/2019 0.4  0.1 - 1.0 K/uL Final  . Eosinophils Relative 04/26/2019 3  % Final  . Eosinophils Absolute 04/26/2019 0.1  0.0 - 0.5 K/uL Final  . Basophils Relative 04/26/2019 0  % Final  . Basophils Absolute 04/26/2019 0.0  0.0 - 0.1 K/uL Final  . Immature Granulocytes 04/26/2019 0  % Final  . Abs Immature Granulocytes 04/26/2019 0.00  0.00 -  0.07 K/uL Final   Performed at [facility], [address] [place], [place] [address]    (this displays the last labs from the last 3 days)  No results found for: TOTALPROTELP, ALBUMINELP, A1GS, A2GS, BETS, BETA2SER, GAMS, MSPIKE, SPEI (this displays SPEP labs)  No results found for: KPAFRELGTCHN, LAMBDASER, KAPLAMBRATIO (kappa/lambda light chains)  No results found for: HGBA, HGBA2QUANT, HGBFQUANT, HGBSQUAN (Hemoglobinopathy evaluation)   No results found for: LDH  Lab Results  Component Value Date   IRON 165 12/27/2018   TIBC 330 12/27/2018   IRONPCTSAT 50 (H) 12/27/2018   (Iron and TIBC)  Lab Results  Component Value Date   FERRITIN 67 12/16/2016    Urinalysis    Component Value Date/Time   COLORURINE YELLOW 12/27/2018 1448   APPEARANCEUR CLEAR 12/27/2018 1448   LABSPEC 1.008 12/27/2018 1448   PHURINE 7.0  12/27/2018 1448   GLUCOSEU NEGATIVE 12/27/2018 1448   HGBUR NEGATIVE 12/27/2018 1448   BILIRUBINUR NEGATIVE 12/27/2017 1905   KETONESUR NEGATIVE 12/27/2018 1448   PROTEINUR NEGATIVE 12/27/2018 1448   UROBILINOGEN 0.2 12/06/2014 1529   NITRITE NEGATIVE 12/27/2018 1448   LEUKOCYTESUR TRACE (A) 12/27/2018 1448    STUDIES: No results found.  Bilateral diagnostic mammography with tomography at [facility] 11/09/2018 1. No mammographic evidence of malignancy. 2. Right lumpectomy bed is incompletely imaged secondary to far posterior location. Patient may benefit from annual breast MRI.  Breast composition: Scattered fibroglandular density.  BI-RADS Category: 2 - Benign findings. Recommend routine follow-up.  RECOMMENDATIONS: Follow-up with routine mammography. Consider annual breast MRI for evaluation of right lumpectomy bed.  ELIGIBLE FOR AVAILABLE RESEARCH PROTOCOL: No  ASSESSMENT: 48 y.o. [place] woman status post right breast upper inner quadrant biopsy 08/24/2017 for a clinical T1c N0 invasive ductal carcinoma, triple positive, with an MIB-1 of 60%  (1) genetics testing 09/10/2017 through the Common Hereditary Cancer Panel + Melanoma Panel found no deleterious mutations in: APC, ATM, AXIN2, BAP1, BARD1, BMPR1A, BRCA1, BRCA2, BRIP1, CDH1, CDK4, CDKN2A (p14ARF), CDKN2A (p16INK4a), CHEK2, CTNNA1, DICER1, EPCAM*, GREM1*, KIT, MEN1, MLH1, MSH2, MSH3, MSH6, MUTYH, NBN, NF1, PALB2, PDGFRA, PMS2, POLD1, POLE, POT1, PTEN, RAD50, RAD51C, RAD51D, RB1, SDHB, SDHC, SDHD, SMAD4, SMARCA4, STK11, TP53, TSC1, TSC2, VHL. The following genes were evaluated for sequence changes only: HOXB13*, MITF*, NTHL1*, SDHA  (2) status post right lumpectomy and sentinel axillary lymph node sampling 10/07/2017 at [facility] for a pT1C pN0, stage IA invasive ductal carcinoma, grade 3, with negative margins  (a) 1 sentinel lymph node removed, bilateral oncoplastic breast reduction  (3) adjuvant chemotherapy  consisting of paclitaxel weekly x12 and trastuzumab every 21 days STARTING 11/12/2017  (a) paclitaxel discontinued after 7 doses (last dose 12/24/2017) due to neuropathy  (4) continued trastuzumab to complete a year (through February 2020).  (a) baseline echocardiogram 09/10/2017 shows an ejection fraction of 55-60%  (b) echocardiogram 12/09/2017 showed an ejection fraction in the 60-65% range  (c) echo in 06/14/2018 shows EF of 60-65%  (d) echo on 10/17/2018 shows EF of 60-65%  (5) adjuvant radiation 02/03/2018 - 03/18/2018 Site/dose:   The patient initially received a dose of 50.4 Gy in 28 fractions to the breast using whole-breast tangent fields. This was delivered using a 3-D conformal technique. The patient then received a boost to the seroma. This delivered an additional 10 Gy in 5 fractions using 9e electrons with a special teletherapy technique. The total dose was 60.4 Gy.   (6) anastrozole started 03/28/2018  (a) bone density 09/05/2018--normal  (  b) patient refuses tamoxifen because of family history of strokes and uterine cancer  (c) breast cancer index predicts a risk of late recurrence of 10.6% and suggest significant benefit from continuing endocrine therapy for an extended period  (7) goserelin started 09/29/2017, discontinued after 08/05/2018 dose, restarted on 11/04/2018 when her menses resumed  PLAN: [name] is now about a year and a half out from definitive surgery for breast cancer with no evidence of disease recurrence.  This is favorable.  She is tolerating the goserelin with no unusual side effects and remarkably few postmenopausal side effects.  She is doing well on the anastrozole also.  We discussed the breast cancer index in detail.  My recommendation to her is that she take the anastrozole 7 years.  After that if she wishes she could switch to raloxifene but very likely she will want to simply stop antiestrogens  We discussed the results of her mammogram and she has  already been scheduled for an MRI next month.  I think it would be prudent to continue mammography in February and MRI in August for at least another year.  I have encouraged her to exercise regularly  She knows to call for any other issue that may develop before the next visit.   Virgie Dad. Camillo Quadros, MD  04/26/19 4:02 PM Medical Oncology and Hematology [facility] [address] [place], [place] [address] Tel. [phone]    Fax. [phone]   I, Wilburn Mylar, am acting as scribe for Dr. Virgie Dad. Nakeshia Waldeck.  I, Lurline Del MD, have reviewed the above documentation for accuracy and completeness, and I agree with the above.

## 2019-04-26 ENCOUNTER — Inpatient Hospital Stay: Payer: BC Managed Care – PPO

## 2019-04-26 ENCOUNTER — Inpatient Hospital Stay (HOSPITAL_BASED_OUTPATIENT_CLINIC_OR_DEPARTMENT_OTHER): Payer: BC Managed Care – PPO | Admitting: Oncology

## 2019-04-26 ENCOUNTER — Other Ambulatory Visit: Payer: Self-pay

## 2019-04-26 VITALS — BP 121/81 | HR 84 | Temp 97.1°F | Resp 18 | Ht 66.0 in | Wt 147.2 lb

## 2019-04-26 VITALS — BP 138/83 | HR 75 | Temp 98.1°F | Resp 18

## 2019-04-26 DIAGNOSIS — Z79811 Long term (current) use of aromatase inhibitors: Secondary | ICD-10-CM | POA: Diagnosis not present

## 2019-04-26 DIAGNOSIS — M25559 Pain in unspecified hip: Secondary | ICD-10-CM

## 2019-04-26 DIAGNOSIS — Z9225 Personal history of immunosupression therapy: Secondary | ICD-10-CM

## 2019-04-26 DIAGNOSIS — M2550 Pain in unspecified joint: Secondary | ICD-10-CM

## 2019-04-26 DIAGNOSIS — C50211 Malignant neoplasm of upper-inner quadrant of right female breast: Secondary | ICD-10-CM

## 2019-04-26 DIAGNOSIS — Z923 Personal history of irradiation: Secondary | ICD-10-CM

## 2019-04-26 DIAGNOSIS — G629 Polyneuropathy, unspecified: Secondary | ICD-10-CM

## 2019-04-26 DIAGNOSIS — Z170 Estrogen receptor positive status [ER+]: Secondary | ICD-10-CM | POA: Diagnosis not present

## 2019-04-26 DIAGNOSIS — Z9221 Personal history of antineoplastic chemotherapy: Secondary | ICD-10-CM | POA: Diagnosis not present

## 2019-04-26 DIAGNOSIS — Z8041 Family history of malignant neoplasm of ovary: Secondary | ICD-10-CM

## 2019-04-26 DIAGNOSIS — Z95828 Presence of other vascular implants and grafts: Secondary | ICD-10-CM

## 2019-04-26 DIAGNOSIS — Z808 Family history of malignant neoplasm of other organs or systems: Secondary | ICD-10-CM

## 2019-04-26 DIAGNOSIS — Z79899 Other long term (current) drug therapy: Secondary | ICD-10-CM

## 2019-04-26 DIAGNOSIS — Z8042 Family history of malignant neoplasm of prostate: Secondary | ICD-10-CM

## 2019-04-26 LAB — CBC WITH DIFFERENTIAL (CANCER CENTER ONLY)
Abs Immature Granulocytes: 0 10*3/uL (ref 0.00–0.07)
Basophils Absolute: 0 10*3/uL (ref 0.0–0.1)
Basophils Relative: 0 %
Eosinophils Absolute: 0.1 10*3/uL (ref 0.0–0.5)
Eosinophils Relative: 3 %
HCT: 38.9 % (ref 36.0–46.0)
Hemoglobin: 13.5 g/dL (ref 12.0–15.0)
Immature Granulocytes: 0 %
Lymphocytes Relative: 21 %
Lymphs Abs: 1 10*3/uL (ref 0.7–4.0)
MCH: 33.8 pg (ref 26.0–34.0)
MCHC: 34.7 g/dL (ref 30.0–36.0)
MCV: 97.3 fL (ref 80.0–100.0)
Monocytes Absolute: 0.4 10*3/uL (ref 0.1–1.0)
Monocytes Relative: 8 %
Neutro Abs: 3.2 10*3/uL (ref 1.7–7.7)
Neutrophils Relative %: 68 %
Platelet Count: 193 10*3/uL (ref 150–400)
RBC: 4 MIL/uL (ref 3.87–5.11)
RDW: 12.9 % (ref 11.5–15.5)
WBC Count: 4.7 10*3/uL (ref 4.0–10.5)
nRBC: 0 % (ref 0.0–0.2)

## 2019-04-26 LAB — CMP (CANCER CENTER ONLY)
ALT: 26 U/L (ref 0–44)
AST: 23 U/L (ref 15–41)
Albumin: 4.3 g/dL (ref 3.5–5.0)
Alkaline Phosphatase: 93 U/L (ref 38–126)
Anion gap: 9 (ref 5–15)
BUN: 11 mg/dL (ref 6–20)
CO2: 31 mmol/L (ref 22–32)
Calcium: 9.6 mg/dL (ref 8.9–10.3)
Chloride: 104 mmol/L (ref 98–111)
Creatinine: 0.69 mg/dL (ref 0.44–1.00)
GFR, Est AFR Am: >60 mL/min (ref >60)
GFR, Estimated: >60 mL/min (ref >60)
Glucose, Bld: 106 mg/dL — ABNORMAL HIGH (ref 70–99)
Potassium: 4.5 mmol/L (ref 3.5–5.1)
Sodium: 144 mmol/L (ref 135–145)
Total Bilirubin: 0.8 mg/dL (ref 0.3–1.2)
Total Protein: 6.9 g/dL (ref 6.5–8.1)

## 2019-04-26 MED ORDER — GOSERELIN ACETATE 3.6 MG ~~LOC~~ IMPL
3.6000 mg | DRUG_IMPLANT | Freq: Once | SUBCUTANEOUS | Status: AC
  Administered 2019-04-26: 3.6 mg via SUBCUTANEOUS

## 2019-04-26 MED ORDER — ANASTROZOLE 1 MG PO TABS: 1.0000 mg | ORAL_TABLET | Freq: Every day | ORAL | 4 refills | Status: DC

## 2019-04-26 MED ORDER — GOSERELIN ACETATE 3.6 MG ~~LOC~~ IMPL
DRUG_IMPLANT | SUBCUTANEOUS | Status: AC
  Filled 2019-04-26: qty 3.6

## 2019-04-26 NOTE — Patient Instructions (Signed)

## 2019-04-27 ENCOUNTER — Encounter: Payer: Self-pay | Admitting: Oncology

## 2019-05-24 ENCOUNTER — Inpatient Hospital Stay: Payer: BC Managed Care – PPO | Attending: Oncology

## 2019-05-24 ENCOUNTER — Other Ambulatory Visit: Payer: Self-pay

## 2019-05-24 VITALS — BP 130/90 | HR 81 | Temp 98.2°F | Resp 18

## 2019-05-24 DIAGNOSIS — Z170 Estrogen receptor positive status [ER+]: Secondary | ICD-10-CM | POA: Diagnosis not present

## 2019-05-24 DIAGNOSIS — Z9221 Personal history of antineoplastic chemotherapy: Secondary | ICD-10-CM | POA: Diagnosis not present

## 2019-05-24 DIAGNOSIS — C50211 Malignant neoplasm of upper-inner quadrant of right female breast: Secondary | ICD-10-CM

## 2019-05-24 DIAGNOSIS — Z9225 Personal history of immunosupression therapy: Secondary | ICD-10-CM | POA: Insufficient documentation

## 2019-05-24 DIAGNOSIS — Z95828 Presence of other vascular implants and grafts: Secondary | ICD-10-CM

## 2019-05-24 DIAGNOSIS — Z923 Personal history of irradiation: Secondary | ICD-10-CM | POA: Insufficient documentation

## 2019-05-24 DIAGNOSIS — Z79818 Long term (current) use of other agents affecting estrogen receptors and estrogen levels: Secondary | ICD-10-CM | POA: Insufficient documentation

## 2019-05-24 MED ORDER — GOSERELIN ACETATE 3.6 MG ~~LOC~~ IMPL
3.6000 mg | DRUG_IMPLANT | Freq: Once | SUBCUTANEOUS | Status: AC
  Administered 2019-05-24: 16:00:00 3.6 mg via SUBCUTANEOUS

## 2019-05-24 MED ORDER — GOSERELIN ACETATE 3.6 MG ~~LOC~~ IMPL
DRUG_IMPLANT | SUBCUTANEOUS | Status: AC
  Filled 2019-05-24: qty 3.6

## 2019-05-24 NOTE — Patient Instructions (Signed)

## 2019-05-30 ENCOUNTER — Telehealth: Payer: Self-pay | Admitting: *Deleted

## 2019-05-30 NOTE — Telephone Encounter (Signed)
Patient called and left a message that she needs to reschedule her [place] scan. Rescheduled the scan and called the patient back

## 2019-06-02 ENCOUNTER — Ambulatory Visit (HOSPITAL_COMMUNITY)
Admission: RE | Admit: 2019-06-02 | Discharge: 2019-06-02 | Disposition: A | Discharge status: Home / Self Care | Payer: BC Managed Care – PPO | Source: Ambulatory Visit | Attending: Gynecologic Oncology | Admitting: Gynecologic Oncology

## 2019-06-02 ENCOUNTER — Other Ambulatory Visit: Payer: Self-pay

## 2019-06-02 DIAGNOSIS — R935 Abnormal findings on diagnostic imaging of other abdominal regions, including retroperitoneum: Secondary | ICD-10-CM | POA: Diagnosis present

## 2019-06-02 DIAGNOSIS — Z8041 Family history of malignant neoplasm of ovary: Secondary | ICD-10-CM

## 2019-06-06 ENCOUNTER — Ambulatory Visit (HOSPITAL_COMMUNITY): Payer: BC Managed Care – PPO

## 2019-06-07 ENCOUNTER — Telehealth: Payer: Self-pay

## 2019-06-07 DIAGNOSIS — Z8041 Family history of malignant neoplasm of ovary: Secondary | ICD-10-CM

## 2019-06-07 NOTE — Telephone Encounter (Signed)
Told Hannah Woodard that the the [place] was normal. No suspicious ovarian or adnexal mass seen per Joylene John, NP. Pt needs to schedule follow up with Dr, Denman George  In December and have CA-125 lab drawn a couple of days prior to visit.  Follow up US will be discussed at that visit.

## 2019-06-21 ENCOUNTER — Ambulatory Visit: Payer: BC Managed Care – PPO

## 2019-06-23 ENCOUNTER — Other Ambulatory Visit: Payer: Self-pay

## 2019-06-23 ENCOUNTER — Inpatient Hospital Stay: Payer: BC Managed Care – PPO | Attending: Oncology

## 2019-06-23 VITALS — BP 138/81 | HR 91 | Temp 97.8°F | Resp 18

## 2019-06-23 DIAGNOSIS — Z170 Estrogen receptor positive status [ER+]: Secondary | ICD-10-CM | POA: Insufficient documentation

## 2019-06-23 DIAGNOSIS — Z9221 Personal history of antineoplastic chemotherapy: Secondary | ICD-10-CM | POA: Insufficient documentation

## 2019-06-23 DIAGNOSIS — Z95828 Presence of other vascular implants and grafts: Secondary | ICD-10-CM

## 2019-06-23 DIAGNOSIS — C50211 Malignant neoplasm of upper-inner quadrant of right female breast: Secondary | ICD-10-CM | POA: Diagnosis not present

## 2019-06-23 DIAGNOSIS — Z9225 Personal history of immunosupression therapy: Secondary | ICD-10-CM | POA: Insufficient documentation

## 2019-06-23 DIAGNOSIS — Z79811 Long term (current) use of aromatase inhibitors: Secondary | ICD-10-CM | POA: Insufficient documentation

## 2019-06-23 DIAGNOSIS — Z923 Personal history of irradiation: Secondary | ICD-10-CM | POA: Insufficient documentation

## 2019-06-23 MED ORDER — GOSERELIN ACETATE 3.6 MG ~~LOC~~ IMPL
3.6000 mg | DRUG_IMPLANT | Freq: Once | SUBCUTANEOUS | Status: AC
  Administered 2019-06-23: 3.6 mg via SUBCUTANEOUS

## 2019-06-23 MED ORDER — GOSERELIN ACETATE 3.6 MG ~~LOC~~ IMPL
DRUG_IMPLANT | SUBCUTANEOUS | Status: AC
  Filled 2019-06-23: qty 3.6

## 2019-06-23 NOTE — Patient Instructions (Signed)

## 2019-07-04 ENCOUNTER — Telehealth: Payer: Self-pay | Admitting: Oncology

## 2019-07-04 NOTE — Telephone Encounter (Signed)
Returned patient's phone call regarding rescheduling an appointment, patient does not have voicemail set up.

## 2019-07-19 ENCOUNTER — Ambulatory Visit: Payer: BC Managed Care – PPO

## 2019-07-21 ENCOUNTER — Ambulatory Visit: Payer: BC Managed Care – PPO

## 2019-07-21 ENCOUNTER — Inpatient Hospital Stay: Payer: BC Managed Care – PPO | Attending: Oncology

## 2019-07-21 ENCOUNTER — Inpatient Hospital Stay: Payer: BC Managed Care – PPO

## 2019-07-21 ENCOUNTER — Other Ambulatory Visit: Payer: Self-pay

## 2019-07-21 ENCOUNTER — Telehealth: Payer: Self-pay | Admitting: Oncology

## 2019-07-21 VITALS — BP 142/85 | HR 82 | Temp 98.3°F | Resp 18

## 2019-07-21 DIAGNOSIS — C50211 Malignant neoplasm of upper-inner quadrant of right female breast: Secondary | ICD-10-CM | POA: Insufficient documentation

## 2019-07-21 DIAGNOSIS — Z923 Personal history of irradiation: Secondary | ICD-10-CM | POA: Diagnosis not present

## 2019-07-21 DIAGNOSIS — Z9221 Personal history of antineoplastic chemotherapy: Secondary | ICD-10-CM | POA: Insufficient documentation

## 2019-07-21 DIAGNOSIS — Z79818 Long term (current) use of other agents affecting estrogen receptors and estrogen levels: Secondary | ICD-10-CM | POA: Insufficient documentation

## 2019-07-21 DIAGNOSIS — Z95828 Presence of other vascular implants and grafts: Secondary | ICD-10-CM

## 2019-07-21 DIAGNOSIS — Z170 Estrogen receptor positive status [ER+]: Secondary | ICD-10-CM | POA: Insufficient documentation

## 2019-07-21 DIAGNOSIS — Z9225 Personal history of immunosupression therapy: Secondary | ICD-10-CM | POA: Insufficient documentation

## 2019-07-21 MED ORDER — GOSERELIN ACETATE 3.6 MG ~~LOC~~ IMPL
DRUG_IMPLANT | SUBCUTANEOUS | Status: AC
  Filled 2019-07-21: qty 3.6

## 2019-07-21 MED ORDER — GOSERELIN ACETATE 3.6 MG ~~LOC~~ IMPL
3.6000 mg | DRUG_IMPLANT | Freq: Once | SUBCUTANEOUS | Status: AC
  Administered 2019-07-21: 3.6 mg via SUBCUTANEOUS

## 2019-07-21 NOTE — Telephone Encounter (Signed)
Returned patient's phone call regarding rescheduling an appointment, per patient's request 10/23 has been rescheduled for a later time today.

## 2019-07-21 NOTE — Patient Instructions (Signed)

## 2019-08-16 ENCOUNTER — Ambulatory Visit: Payer: BC Managed Care – PPO

## 2019-08-18 ENCOUNTER — Inpatient Hospital Stay: Payer: BC Managed Care – PPO | Attending: Oncology

## 2019-08-18 ENCOUNTER — Other Ambulatory Visit: Payer: Self-pay

## 2019-08-18 VITALS — BP 138/72 | HR 82 | Temp 98.2°F | Resp 18

## 2019-08-18 DIAGNOSIS — Z923 Personal history of irradiation: Secondary | ICD-10-CM | POA: Insufficient documentation

## 2019-08-18 DIAGNOSIS — C50211 Malignant neoplasm of upper-inner quadrant of right female breast: Secondary | ICD-10-CM | POA: Diagnosis not present

## 2019-08-18 DIAGNOSIS — Z170 Estrogen receptor positive status [ER+]: Secondary | ICD-10-CM | POA: Diagnosis not present

## 2019-08-18 DIAGNOSIS — Z9221 Personal history of antineoplastic chemotherapy: Secondary | ICD-10-CM | POA: Insufficient documentation

## 2019-08-18 DIAGNOSIS — Z9225 Personal history of immunosupression therapy: Secondary | ICD-10-CM | POA: Diagnosis not present

## 2019-08-18 DIAGNOSIS — Z95828 Presence of other vascular implants and grafts: Secondary | ICD-10-CM

## 2019-08-18 DIAGNOSIS — Z79818 Long term (current) use of other agents affecting estrogen receptors and estrogen levels: Secondary | ICD-10-CM | POA: Insufficient documentation

## 2019-08-18 MED ORDER — GOSERELIN ACETATE 3.6 MG ~~LOC~~ IMPL
3.6000 mg | DRUG_IMPLANT | Freq: Once | SUBCUTANEOUS | Status: AC
  Administered 2019-08-18: 3.6 mg via SUBCUTANEOUS
  Filled 2019-08-18: qty 3.6

## 2019-08-18 NOTE — Patient Instructions (Signed)

## 2019-09-01 ENCOUNTER — Other Ambulatory Visit: Payer: Self-pay

## 2019-09-01 ENCOUNTER — Inpatient Hospital Stay: Payer: BC Managed Care – PPO | Attending: Oncology

## 2019-09-01 DIAGNOSIS — Z8041 Family history of malignant neoplasm of ovary: Secondary | ICD-10-CM

## 2019-09-01 DIAGNOSIS — Z170 Estrogen receptor positive status [ER+]: Secondary | ICD-10-CM | POA: Insufficient documentation

## 2019-09-01 DIAGNOSIS — Z79818 Long term (current) use of other agents affecting estrogen receptors and estrogen levels: Secondary | ICD-10-CM | POA: Insufficient documentation

## 2019-09-01 DIAGNOSIS — C50211 Malignant neoplasm of upper-inner quadrant of right female breast: Secondary | ICD-10-CM | POA: Diagnosis not present

## 2019-09-02 LAB — CA 125: Cancer Antigen (CA) 125: 13.9 U/mL (ref 0.0–38.1)

## 2019-09-08 ENCOUNTER — Other Ambulatory Visit: Payer: Self-pay

## 2019-09-08 ENCOUNTER — Inpatient Hospital Stay (HOSPITAL_BASED_OUTPATIENT_CLINIC_OR_DEPARTMENT_OTHER): Payer: BC Managed Care – PPO | Admitting: Gynecologic Oncology

## 2019-09-08 ENCOUNTER — Encounter: Payer: Self-pay | Admitting: Gynecologic Oncology

## 2019-09-08 VITALS — BP 123/86 | HR 73 | Temp 97.9°F | Resp 17 | Ht 66.0 in | Wt 156.3 lb

## 2019-09-08 DIAGNOSIS — C50211 Malignant neoplasm of upper-inner quadrant of right female breast: Secondary | ICD-10-CM | POA: Diagnosis not present

## 2019-09-08 DIAGNOSIS — Z8041 Family history of malignant neoplasm of ovary: Secondary | ICD-10-CM | POA: Diagnosis not present

## 2019-09-08 NOTE — Progress Notes (Signed)
Gynecologic Oncology Follow-up Note  Chief Complaint:  Chief Complaint  Patient presents with  . Family history of ovarian cancer    Consult was initially requested by Dr. Jana Hakim for the evaluation of Hannah Woodard 48 y.o. female  Assessment/Plan:  Hannah Woodard  is a 48 y.o.  year old with a personal history of triple positive breast cancer at age 63 and family history of ovarian cancer, BRCA negative on genetic testing. We will plan on scheduling transvaginal ultrasound scan and Ca1 25 in June and again in December. And continue 12 monthly evaluations until she decides it is time for her to have a definitive BSO. I will see her annually in December.  I discussed the assessment and treatment plan with the patient. The patient was provided with an opportunity to ask questions and all were answered. The patient agreed with the plan and demonstrated an understanding of the instructions.    The patient was advised to call back or see an in-person evaluation if the symptoms worsen or if the condition fails to improve as anticipated.   HPI: Hannah Woodard is a 48 year old P0 who is seen in consultation at the request of Dr Jana Hakim for an increased risk of ovarian cancer.   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  This was treated with surgery radiation and chemotherapy.  She also received trastuzumab, and then goserelin injections for ovarian suppression and anastrozole.  She has remained disease-free since her initial diagnosis.  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  She reports a family history of a mother who had ovarian cancer in her early 22s.  The patient has received genetic testing for BRCA and HRD and this is negative.  Her sibling had her ovaries removed for prophylactic reasons with no malignancy identified.  Her father had a diagnosis of prostate cancer but eventually died from  alcoholic cirrhosis.  The patient is nulliparous but desires future pregnancy.  She has sought consultation with [facility] reproductive endocrinology.  They feel that she is not a good candidate for egg donation due to her advanced maternal age, however with donation of either an embryo or an egg donor and insemination she might be able to spontaneously conceived.  She would like to keep her options open regarding future pregnancy.  She has had no history of prior abdominal surgeries.  Other than her history of breast cancer she does not have any other significant medical history.  Interval Hx:  She underwent a transvaginal ultrasound for screening in June 2020 which revealed a prominent right ovary.  It was repeated on June 02, 2019 and showed bilaterally normal ovaries and uterus.  Ca1 25 was assessed in June 2020 was normal at 13.1.  Ca1 25 on September 01, 2019 was stable and normal at 13.9.  She continues to have menopausal symptoms while on goserelin injections.  She uses [place] and coconut oil and other lubricants for intercourse and use of a vaginal dilator for her genital atrophy symptoms.  She reported her last Pap was in 2019 and was normal and she is due again for repeat Pap in 2022.   Current Meds:  Outpatient Encounter Medications as of 09/08/2019  Medication Sig  . acetic acid-hydrocortisone (VOSOL-HC) OTIC solution Place 3 drops into the right ear 2 (two) times daily.  Marland Kitchen anastrozole (ARIMIDEX) 1 MG tablet Take 1 tablet (1 mg total) by mouth  daily.  . Ascorbic Acid (VITAMIN C PO) Take 400 Units by mouth daily.   Marland Kitchen b complex vitamins tablet Take 1 tablet by mouth daily.  . Cholecalciferol (VITAMIN D3) 5000 units TABS Take 10,000 Units by mouth daily.  . COLLAGEN PO Take by mouth daily. Takes beef collagen  . ferrous sulfate 325 (65 FE) MG tablet Take 325 mg by mouth daily with breakfast.  . fish oil-omega-3 fatty acids 1000 MG capsule Take 1 g by mouth daily.  Marland Kitchen  gabapentin (NEURONTIN) 300 MG capsule Take 1 capsule (300 mg total) by mouth at bedtime.  Marland Kitchen ketoconazole (NIZORAL) 2 % cream APPLY TO AFFECTED AREA EVERY DAY  . Magnesium Citrate POWD by Does not apply route daily.  Marland Kitchen OVER THE COUNTER MEDICATION Take 1 tablet by mouth at bedtime as needed (constipation).   . TURMERIC PO Take 1 capsule by mouth as directed.   Marland Kitchen UNABLE TO FIND Take 500 mg by mouth daily. [place] Tail mushroom  . UNABLE TO FIND Take 500 mg by mouth daily. [place] Mushroom  . Zinc 30 MG TABS Take by mouth daily.  . [DISCONTINUED] prochlorperazine (COMPAZINE) 10 MG tablet Take 1 tablet (10 mg total) by mouth every 6 (six) hours as needed (Nausea or vomiting).   No facility-administered encounter medications on file as of 09/08/2019.    Allergy:  Allergies  Allergen Reactions  . Adhesive [Tape]   . Milk-Related Compounds Diarrhea    Stomach cramping  . Poison Ivy Extract [Poison Ivy Extract] Hives, Itching and Rash    Social Hx:   Social History   Socioeconomic History  . Marital status: Married    Spouse name: Not on file  . Number of children: Not on file  . Years of education: Not on file  . Highest education level: Not on file  Occupational History  . Not on file  Tobacco Use  . Smoking status: Never Smoker  . Smokeless tobacco: Never Used  Substance and Sexual Activity  . Alcohol use: Yes  . Drug use: No  . Sexual activity: Not on file  Other Topics Concern  . Not on file  Social History Narrative  . Not on file   Social Determinants of Health   Financial Resource Strain:   . Difficulty of Paying Living Expenses: Not on file  Food Insecurity:   . Worried About Charity fundraiser in the Last Year: Not on file  . Ran Out of Food in the Last Year: Not on file  Transportation Needs:   . Lack of Transportation (Medical): Not on file  . Lack of Transportation (Non-Medical): Not on file  Physical Activity:   . Days of Exercise per Week: Not on file  .  Minutes of Exercise per Session: Not on file  Stress:   . Feeling of Stress : Not on file  Social Connections:   . Frequency of Communication with Friends and Family: Not on file  . Frequency of Social Gatherings with Friends and Family: Not on file  . Attends Religious Services: Not on file  . Active Member of Clubs or Organizations: Not on file  . Attends Archivist Meetings: Not on file  . Marital Status: Not on file  Intimate Partner Violence:   . Fear of Current or Ex-Partner: Not on file  . Emotionally Abused: Not on file  . Physically Abused: Not on file  . Sexually Abused: Not on file    Past Surgical Hx:  Past Surgical  History:  Procedure Laterality Date  . right lumpectomy, sentinel node biopsy, and bilateral mammoplasty  10/2017   [facility]    Past Medical Hx:  Past Medical History:  Diagnosis Date  . Breast cancer ([place])   . Family history of ovarian cancer 09/02/2017  . Family history of prostate cancer in father 09/02/2017  . Family history of uterine cancer 09/02/2017  . Plantar fasciitis     Past Gynecological History:  Amenorrheic on goserelin.G0  Patient's last menstrual period was 10/05/2017.  Family Hx:  Family History  Problem Relation Age of Onset  . Ovarian cancer Mother 46  . Prostate cancer Father 82       'high gleason' unsure number  . Ulcerative colitis Sister   . Other Sister        abn ovaian growth, partial hysterectomy  . Uterine cancer Maternal Aunt 41  . Stroke Maternal Aunt 66  . Basal cell carcinoma Brother   . Skin cancer Paternal Uncle   . Skin cancer Maternal Grandmother   . Stroke Paternal Grandfather 45  . Hydrocephalus Cousin     Review of Systems:  Constitutional  Feels well,    ENT Normal appearing ears and nares bilaterally Skin/Breast  No rash, sores, jaundice, itching, dryness Cardiovascular  No chest pain, shortness of breath, or edema  Pulmonary  No cough or wheeze.  Gastro Intestinal  No nausea,  vomitting, or diarrhoea. No bright red blood per rectum, no abdominal pain, change in bowel movement, or constipation.  Genito Urinary  No frequency, urgency, dysuria, no bleeding Musculo Skeletal  No myalgia, arthralgia, joint swelling or pain  Neurologic  No weakness, numbness, change in gait,  Psychology  No depression, anxiety, insomnia.   Vitals:   not available  Physical Exam: WD in NAD Neck  Supple NROM, without any enlargements.  Lymph Node Survey No cervical supraclavicular or inguinal adenopathy Cardiovascular  Pulse normal rate, regularity and rhythm. S1 and S2 normal.  Lungs  Clear to auscultation bilateraly, without wheezes/crackles/rhonchi. Good air movement.  Skin  No rash/lesions/breakdown  Psychiatry  Alert and oriented to person, place, and time  Abdomen  Normoactive bowel sounds, abdomen soft, non-tender and nonobese without evidence of hernia.  Back No CVA tenderness Genito Urinary  Vulva/vagina: Normal external female genitalia.  No lesions. No discharge or bleeding.  Bladder/urethra:  No lesions or masses, well supported bladder  Vagina: normal  Cervix: Normal appearing, no lesions.  Uterus:  Small, mobile, no parametrial involvement or nodularity.  Adnexa: no palpable masses. Rectal  deferred Extremities  No bilateral cyanosis, clubbing or edema.   Thereasa Solo, MD  09/08/2019, 3:38 PM

## 2019-09-08 NOTE — Patient Instructions (Signed)
Dr Denman George has scheduled you for a repeat US and CA 125 in 6 months.   Dr Denman George will see you again in December, 2021 after another ultrasound and CA 125.  Please call her office with questions at [phone].

## 2019-09-13 ENCOUNTER — Ambulatory Visit: Payer: BC Managed Care – PPO

## 2019-09-15 ENCOUNTER — Inpatient Hospital Stay: Payer: BC Managed Care – PPO

## 2019-09-15 ENCOUNTER — Other Ambulatory Visit: Payer: Self-pay

## 2019-09-15 VITALS — BP 149/90 | HR 72 | Temp 98.0°F | Resp 16

## 2019-09-15 DIAGNOSIS — C50211 Malignant neoplasm of upper-inner quadrant of right female breast: Secondary | ICD-10-CM | POA: Diagnosis not present

## 2019-09-15 DIAGNOSIS — Z95828 Presence of other vascular implants and grafts: Secondary | ICD-10-CM

## 2019-09-15 MED ORDER — GOSERELIN ACETATE 3.6 MG ~~LOC~~ IMPL
DRUG_IMPLANT | SUBCUTANEOUS | Status: AC
  Filled 2019-09-15: qty 3.6

## 2019-09-15 MED ORDER — GOSERELIN ACETATE 3.6 MG ~~LOC~~ IMPL
3.6000 mg | DRUG_IMPLANT | Freq: Once | SUBCUTANEOUS | Status: AC
  Administered 2019-09-15: 3.6 mg via SUBCUTANEOUS

## 2019-10-11 ENCOUNTER — Other Ambulatory Visit: Payer: Self-pay

## 2019-10-11 ENCOUNTER — Inpatient Hospital Stay: Payer: BC Managed Care – PPO | Attending: Oncology

## 2019-10-11 ENCOUNTER — Ambulatory Visit: Payer: BC Managed Care – PPO

## 2019-10-11 VITALS — BP 144/80 | HR 80 | Temp 98.1°F | Resp 18

## 2019-10-11 DIAGNOSIS — C50211 Malignant neoplasm of upper-inner quadrant of right female breast: Secondary | ICD-10-CM | POA: Diagnosis not present

## 2019-10-11 DIAGNOSIS — Z923 Personal history of irradiation: Secondary | ICD-10-CM | POA: Diagnosis not present

## 2019-10-11 DIAGNOSIS — Z79818 Long term (current) use of other agents affecting estrogen receptors and estrogen levels: Secondary | ICD-10-CM | POA: Diagnosis not present

## 2019-10-11 DIAGNOSIS — Z9221 Personal history of antineoplastic chemotherapy: Secondary | ICD-10-CM | POA: Insufficient documentation

## 2019-10-11 DIAGNOSIS — Z170 Estrogen receptor positive status [ER+]: Secondary | ICD-10-CM | POA: Insufficient documentation

## 2019-10-11 DIAGNOSIS — Z95828 Presence of other vascular implants and grafts: Secondary | ICD-10-CM

## 2019-10-11 MED ORDER — GOSERELIN ACETATE 3.6 MG ~~LOC~~ IMPL
DRUG_IMPLANT | SUBCUTANEOUS | Status: AC
  Filled 2019-10-11: qty 3.6

## 2019-10-11 MED ORDER — GOSERELIN ACETATE 3.6 MG ~~LOC~~ IMPL
3.6000 mg | DRUG_IMPLANT | Freq: Once | SUBCUTANEOUS | Status: AC
  Administered 2019-10-11: 16:00:00 3.6 mg via SUBCUTANEOUS

## 2019-10-13 ENCOUNTER — Ambulatory Visit: Payer: BC Managed Care – PPO

## 2019-11-08 ENCOUNTER — Inpatient Hospital Stay: Payer: BC Managed Care – PPO | Attending: Oncology

## 2019-11-08 ENCOUNTER — Ambulatory Visit: Payer: BC Managed Care – PPO

## 2019-11-08 ENCOUNTER — Other Ambulatory Visit: Payer: Self-pay

## 2019-11-08 VITALS — BP 146/95 | HR 82 | Temp 97.1°F | Resp 20

## 2019-11-08 DIAGNOSIS — C50211 Malignant neoplasm of upper-inner quadrant of right female breast: Secondary | ICD-10-CM | POA: Insufficient documentation

## 2019-11-08 DIAGNOSIS — Z9221 Personal history of antineoplastic chemotherapy: Secondary | ICD-10-CM | POA: Insufficient documentation

## 2019-11-08 DIAGNOSIS — Z95828 Presence of other vascular implants and grafts: Secondary | ICD-10-CM

## 2019-11-08 DIAGNOSIS — Z79818 Long term (current) use of other agents affecting estrogen receptors and estrogen levels: Secondary | ICD-10-CM | POA: Insufficient documentation

## 2019-11-08 DIAGNOSIS — Z923 Personal history of irradiation: Secondary | ICD-10-CM | POA: Insufficient documentation

## 2019-11-08 DIAGNOSIS — Z9225 Personal history of immunosupression therapy: Secondary | ICD-10-CM | POA: Insufficient documentation

## 2019-11-08 DIAGNOSIS — Z170 Estrogen receptor positive status [ER+]: Secondary | ICD-10-CM | POA: Insufficient documentation

## 2019-11-08 MED ORDER — GOSERELIN ACETATE 3.6 MG ~~LOC~~ IMPL
3.6000 mg | DRUG_IMPLANT | Freq: Once | SUBCUTANEOUS | Status: AC
  Administered 2019-11-08: 16:00:00 3.6 mg via SUBCUTANEOUS

## 2019-11-08 NOTE — Patient Instructions (Signed)

## 2019-11-10 ENCOUNTER — Ambulatory Visit: Payer: BC Managed Care – PPO

## 2019-11-22 ENCOUNTER — Telehealth: Payer: Self-pay | Admitting: Oncology

## 2019-11-22 NOTE — Telephone Encounter (Signed)
Rescheduled per 2/24 sch msg, pt req. Called and spoke with pt, confirmed 3/10, 4/7, 5/5, and 6/2 appts

## 2019-12-01 MED ORDER — SULFAMETHOXAZOLE-TRIMETHOPRIM 800-160 MG PO TABS: 1.0000 | ORAL_TABLET | Freq: Two times a day (BID) | ORAL | 0 refills | Status: DC

## 2019-12-06 ENCOUNTER — Ambulatory Visit: Payer: BC Managed Care – PPO

## 2019-12-06 ENCOUNTER — Encounter: Payer: Self-pay | Admitting: Physician Assistant

## 2019-12-06 ENCOUNTER — Inpatient Hospital Stay: Payer: BC Managed Care – PPO | Attending: Oncology

## 2019-12-06 ENCOUNTER — Ambulatory Visit (INDEPENDENT_AMBULATORY_CARE_PROVIDER_SITE_OTHER): Payer: BC Managed Care – PPO | Admitting: Physician Assistant

## 2019-12-06 ENCOUNTER — Other Ambulatory Visit: Payer: Self-pay

## 2019-12-06 VITALS — BP 122/68 | HR 81 | Temp 97.5°F | Wt 165.2 lb

## 2019-12-06 VITALS — BP 129/92 | HR 88 | Temp 97.8°F | Resp 18

## 2019-12-06 DIAGNOSIS — R21 Rash and other nonspecific skin eruption: Secondary | ICD-10-CM

## 2019-12-06 DIAGNOSIS — N3000 Acute cystitis without hematuria: Secondary | ICD-10-CM | POA: Diagnosis not present

## 2019-12-06 DIAGNOSIS — Z170 Estrogen receptor positive status [ER+]: Secondary | ICD-10-CM | POA: Insufficient documentation

## 2019-12-06 DIAGNOSIS — C50211 Malignant neoplasm of upper-inner quadrant of right female breast: Secondary | ICD-10-CM | POA: Diagnosis present

## 2019-12-06 DIAGNOSIS — Z95828 Presence of other vascular implants and grafts: Secondary | ICD-10-CM

## 2019-12-06 DIAGNOSIS — Z79818 Long term (current) use of other agents affecting estrogen receptors and estrogen levels: Secondary | ICD-10-CM | POA: Insufficient documentation

## 2019-12-06 MED ORDER — GOSERELIN ACETATE 3.6 MG ~~LOC~~ IMPL
3.6000 mg | DRUG_IMPLANT | Freq: Once | SUBCUTANEOUS | Status: AC
  Administered 2019-12-06: 08:00:00 3.6 mg via SUBCUTANEOUS

## 2019-12-06 MED ORDER — CIPROFLOXACIN HCL 500 MG PO TABS: 500.0000 mg | ORAL_TABLET | Freq: Two times a day (BID) | ORAL | 0 refills | Status: AC

## 2019-12-06 MED ORDER — TRIAMCINOLONE ACETONIDE 0.5 % EX CREA: 1.0000 "application " | TOPICAL_CREAM | Freq: Two times a day (BID) | CUTANEOUS | 2 refills | Status: DC

## 2019-12-06 NOTE — Progress Notes (Signed)
HPI: complains of UTI symptoms x 7 days.  Patient has a history of breast cancer, is on arimedix and follows with Dr. Griffith Citron.   Was treated with bactrim via phone call, was unable to come into the office, she developed a rash and diarrhea so it was stopped after 2 days.  associated with dysuria, urgency and small volume voiding with increased frequency She has increased water.  Has some lower back pain, but no flank pain. denies hematuria,or fever   PMH: reviewed  ROS:  Gen.: No unexpected weight change, no night sweats Lungs: No cough or shortness of breath Cardiovascular: No palpitations or chest pain  PE: LMP 10/05/2017  General: No acute distress Lungs: Clear to auscultation Cardiovascular: Regular rate rhythm, no edema Abdomen: Mild to moderate discomfort of her suprapubic region, no flank tenderness to palpation  Lab Results  Component Value Date   WBC 4.7 04/26/2019   HGB 13.5 04/26/2019   HCT 38.9 04/26/2019   PLT 193 04/26/2019   GLUCOSE 106 (H) 04/26/2019   CHOL 176 12/27/2018   TRIG 57 12/27/2018   HDL 61 12/27/2018   LDLCALC 101 (H) 12/27/2018   ALT 26 04/26/2019   AST 23 04/26/2019   NA 144 04/26/2019   K 4.5 04/26/2019   CL 104 04/26/2019   CREATININE 0.69 04/26/2019   BUN 11 04/26/2019   CO2 31 04/26/2019   TSH 1.67 12/27/2018   INR 1.15 12/27/2017   HGBA1C 4.8 12/27/2018   MICROALBUR 0.2 12/27/2018    Assessment/Plan: UTI symptoms Empiric antibiotic x7 days May be due to vaginal dryness from arimedix, if negative may do pelvic exam Urine culture for identification and sensitivities Hydration recommended education provided  Rash Likely from bactrim, added to allergy list Triamcinolone sent in  Future Appointments  Date Time Provider [place]  12/27/2019  2:00 PM Vicie Mutters, PA-C [facility] None  01/03/2020  8:00 AM [facility] [place] [facility] None  01/31/2020  8:00 AM [facility] [employer] FLUSH [facility] None  02/28/2020  8:00 AM  [facility] [place] [facility] None  03/08/2020  9:15 AM [facility] LAB 6 [facility] None  03/08/2020 10:00 AM [facility]-US 1 [facility]US [place]  03/29/2020  3:30 PM [facility] [place] [facility] None  04/24/2020  3:00 PM [facility] LAB 1 [facility] None  04/24/2020  3:30 PM Magrinat, Virgie Dad, MD [facility] None  09/23/2020  9:15 AM [facility] LAB 6 [facility] None  09/23/2020 10:00 AM [facility]-US 1 [facility]-US 

## 2019-12-06 NOTE — Patient Instructions (Signed)

## 2019-12-08 ENCOUNTER — Ambulatory Visit: Payer: BC Managed Care – PPO

## 2019-12-08 LAB — URINALYSIS, ROUTINE W REFLEX MICROSCOPIC
Bacteria, UA: NONE SEEN /HPF
Bilirubin Urine: NEGATIVE
Glucose, UA: NEGATIVE
Hgb urine dipstick: NEGATIVE
Hyaline Cast: NONE SEEN /LPF
Ketones, ur: NEGATIVE
Nitrite: NEGATIVE
Protein, ur: NEGATIVE
Specific Gravity, Urine: 1.008 (ref 1.001–1.03)
WBC, UA: NONE SEEN /HPF (ref 0–5)
pH: 7 (ref 5.0–8.0)

## 2019-12-08 LAB — URINE CULTURE
MICRO NUMBER:: 10237325
SPECIMEN QUALITY:: ADEQUATE

## 2019-12-26 NOTE — Progress Notes (Signed)
Complete Physical  Assessment and Plan:  Acute cystitis without hematuria -     Urinalysis, Routine w reflex microscopic -     Urine Culture UTI versus OAB versus vaginal dryness - will check UA, C&S  Acute vaginitis -     WET PREP BY MOLECULAR PROBE definite appearance of vaginitis- will treat with topical and get labs -     nystatin ointment (MYCOSTATIN); Apply 1 application topically 2 (two) times daily. -     triamcinolone ointment (KENALOG) 0.1 %; Apply 1 application topically 2 (two) times daily.  Medication management -     CBC with Differential/Platelet -     COMPLETE METABOLIC PANEL WITH GFR -     Magnesium  Malignant neoplasm of upper-inner quadrant of right breast in female, estrogen receptor positive ([place]) Continue follow up  Neuropathy due to chemotherapeutic drug (HCC) -     TSH  Vitamin D deficiency -     VITAMIN D 25 Hydroxy (Vit-D Deficiency, Fractures)  Dense breast tissue Continue follow up oncology  Screening for diabetes mellitus -     Hemoglobin A1c  Screening for hematuria or proteinuria -     Urinalysis, Routine w reflex microscopic -     Microalbumin / creatinine urine ratio  Screening for hyperlipidemia -     Lipid panel  Screening, anemia, deficiency, iron -     Iron,Total/Total Iron Binding Cap -     Vitamin B12  Vaginal atrophy Vitamin E supp, albolene   Discussed med's effects and SE's. Screening labs and tests as requested with regular follow-up as recommended.  HPI 49 y.o. female  presents for a complete physical.  Patient was married 2017, had invasive right breast cancer diagnosis Dec 2018, grade 2 triple positive, finished Taxol and Herceptin Feb 2020-following with Dr. Griffith Citron. She is on arimedex, and she is on zoledex monthly to stay in menopause.   She was treated with bactrim for 2 days before culture, could not tolerate due to rash so treated with cipro, she continues to has been having frequency, some dysuria at the  beginning of the stream, some suprapubic pain, , has had some itching and irritation. No discharge.  She is still doing vit E supp, has vaginal dryness.   She is working out.  BMI is Body mass index is 26.31 kg/m., she is working on diet and exercise. Wt Readings from Last 3 Encounters:  12/27/19 163 lb (73.9 kg)  12/06/19 165 lb 3.2 oz (74.9 kg)  09/08/19 156 lb 4.8 oz (70.9 kg)    Her blood pressure has been controlled at home, today their BP is BP: 114/74  Patient is on Vitamin D supplement.  Lab Results  Component Value Date   VD25OH 103 (H) 12/27/2018   Last cholesterol was normal Lab Results  Component Value Date   CHOL 176 12/27/2018   HDL 61 12/27/2018   LDLCALC 101 (H) 12/27/2018   TRIG 57 12/27/2018   CHOLHDL 2.9 12/27/2018   Last A1C was normal Lab Results  Component Value Date   HGBA1C 4.8 12/27/2018    Current Medications:       Current Outpatient Medications (Hematological):  .  ferrous sulfate 325 (65 FE) MG tablet, Take 325 mg by mouth daily with breakfast.  Current Outpatient Medications (Other):  .  anastrozole (ARIMIDEX) 1 MG tablet, Take 1 tablet (1 mg total) by mouth daily. .  Ascorbic Acid (VITAMIN C PO), Take 400 Units by mouth daily.  Marland Kitchen  b  complex vitamins tablet, Take 1 tablet by mouth daily. .  Cholecalciferol (VITAMIN D3) 5000 units TABS, Take 10,000 Units by mouth daily. .  COLLAGEN PO, Take by mouth daily. Takes beef collagen .  fish oil-omega-3 fatty acids 1000 MG capsule, Take 1 g by mouth daily. Marland Kitchen  gabapentin (NEURONTIN) 300 MG capsule, Take 1 capsule (300 mg total) by mouth at bedtime. .  Magnesium Citrate POWD, by Does not apply route daily. Marland Kitchen  OVER THE COUNTER MEDICATION, Take 1 tablet by mouth at bedtime as needed (constipation).  .  triamcinolone cream (KENALOG) 0.5 %, Apply 1 application topically 2 (two) times daily. .  TURMERIC PO, Take 1 capsule by mouth as directed.  Marland Kitchen  UNABLE TO FIND, Take 500 mg by mouth daily. [place]  Tail mushroom .  UNABLE TO FIND, Take 500 mg by mouth daily. [place] Mushroom .  Zinc 30 MG TABS, Take by mouth daily. Marland Kitchen  acetic acid-hydrocortisone (VOSOL-HC) OTIC solution, Place 3 drops into the right ear 2 (two) times daily. (Patient not taking: Reported on 12/27/2019) .  ketoconazole (NIZORAL) 2 % cream, APPLY TO AFFECTED AREA EVERY DAY (Patient not taking: Reported on 12/27/2019)  Health Maintenance:  Immunization History  Administered Date(s) Administered  . Hepatitis A 10/02/1997  . Hepatitis B 12/04/1997  . Td 10/10/2008   Tetanus: 2010- due  Pneumovax: N/A Flu vaccine: N/A Zostavax: N/A Pap: 11/2018 never normal pap, negative HPV MGM: 06/2017 2017s/p lumpectomy and chemo, had breast reduction.  DEXA: 08/2018 on arimedix US Pelvis 05/2019 Colonoscopy: due age 73 EGD: N/A Echo 09/2018 Ct 04/2017  Medical History:  Past Medical History:  Diagnosis Date  . Breast cancer ([place])   . Family history of ovarian cancer 09/02/2017  . Family history of prostate cancer in father 09/02/2017  . Family history of uterine cancer 09/02/2017  . Plantar fasciitis     Allergies Allergies  Allergen Reactions  . Adhesive [Tape]   . Milk-Related Compounds Diarrhea    Stomach cramping  . Bactrim [Sulfamethoxazole-Trimethoprim] Rash  . Poison Ivy Extract [Poison Ivy Extract] Hives, Itching and Rash    SURGICAL HISTORY She  has a past surgical history that includes right lumpectomy, sentinel node biopsy, and bilateral mammoplasty (10/2017). FAMILY HISTORY Her family history includes Basal cell carcinoma in her brother; Hydrocephalus in her cousin; Other in her sister; Ovarian cancer (age of onset: 37) in her mother; Prostate cancer (age of onset: 60) in her father; Skin cancer in her maternal grandmother and paternal uncle; Stroke (age of onset: 20) in her maternal aunt and paternal grandfather; Ulcerative colitis in her sister; Uterine cancer (age of onset: 28) in her maternal aunt. SOCIAL  HISTORY She  reports that she has never smoked. She has never used smokeless tobacco. She reports current alcohol use. She reports that she does not use drugs.  Review of Systems  Constitutional: Negative.   HENT: Negative.   Eyes: Negative.   Respiratory: Negative.   Cardiovascular: Negative.   Gastrointestinal: Negative.   Genitourinary: Positive for dysuria, frequency and urgency.  Musculoskeletal: Positive for back pain. Negative for falls, joint pain, myalgias and neck pain.  Skin: Negative.   Neurological: Negative.   Endo/Heme/Allergies: Negative.   Psychiatric/Behavioral: Negative.      Physical Exam: Estimated body mass index is 26.31 kg/m as calculated from the following:   Height as of this encounter: 5' 6"  (1.676 m).   Weight as of this encounter: 163 lb (73.9 kg). Vitals:   12/27/19 1421  BP: 114/74  Pulse: 80  Temp: 97.7 F (36.5 C)  SpO2: 97%   General Appearance: Well nourished, in no apparent distress. Eyes: PERRLA, EOMs, conjunctiva no swelling or erythema, normal fundi and vessels. Sinuses: No Frontal/maxillary tenderness ENT/Mouth: Ext aud canals clear, normal light reflex with TMs without erythema, bulging.  Good dentition. No erythema, swelling, or exudate on post pharynx. Tonsils not swollen or erythematous. Hearing normal.  Neck: Supple, thyroid normal. No bruits Respiratory: Respiratory effort normal, BS equal bilaterally without rales, rhonchi, wheezing or stridor. Cardio: RRR without murmurs, rubs or gallops. Brisk peripheral pulses without edema.  Chest: symmetric, with normal excursions and percussion. Abdomen: Soft, +BS. Non tender, no guarding, rebound, hernias, masses, or organomegaly. Lymphatics: Non tender without lymphadenopathy.  Genitourinary: VULVA: vulvar erythema, VAGINA: atrophic, vaginal erythema. Musculoskeletal: Full ROM all peripheral extremities,5/5 strength, and normal gait. Skin: Warm, dry without rashes, lesions, ecchymosis.   Neuro: Cranial nerves intact, reflexes equal bilaterally. Normal muscle tone, no cerebellar symptoms. Sensation intact.  Psych: Awake and oriented X 3, normal affect, Insight and Judgment appropriate.   EKG: defer  Vicie Mutters 2:43 PM

## 2019-12-27 ENCOUNTER — Ambulatory Visit (INDEPENDENT_AMBULATORY_CARE_PROVIDER_SITE_OTHER): Payer: BC Managed Care – PPO | Admitting: Physician Assistant

## 2019-12-27 ENCOUNTER — Other Ambulatory Visit: Payer: Self-pay

## 2019-12-27 ENCOUNTER — Encounter: Payer: Self-pay | Admitting: Physician Assistant

## 2019-12-27 VITALS — BP 114/74 | HR 80 | Temp 97.7°F | Ht 66.0 in | Wt 163.0 lb

## 2019-12-27 DIAGNOSIS — Z131 Encounter for screening for diabetes mellitus: Secondary | ICD-10-CM

## 2019-12-27 DIAGNOSIS — Z130 Encounter for screening for diseases of the blood and blood-forming organs and certain disorders involving the immune mechanism: Secondary | ICD-10-CM

## 2019-12-27 DIAGNOSIS — N3000 Acute cystitis without hematuria: Secondary | ICD-10-CM | POA: Diagnosis not present

## 2019-12-27 DIAGNOSIS — E559 Vitamin D deficiency, unspecified: Secondary | ICD-10-CM

## 2019-12-27 DIAGNOSIS — C50211 Malignant neoplasm of upper-inner quadrant of right female breast: Secondary | ICD-10-CM

## 2019-12-27 DIAGNOSIS — Z1389 Encounter for screening for other disorder: Secondary | ICD-10-CM

## 2019-12-27 DIAGNOSIS — Z1329 Encounter for screening for other suspected endocrine disorder: Secondary | ICD-10-CM | POA: Diagnosis not present

## 2019-12-27 DIAGNOSIS — Z1379 Encounter for other screening for genetic and chromosomal anomalies: Secondary | ICD-10-CM

## 2019-12-27 DIAGNOSIS — T451X5A Adverse effect of antineoplastic and immunosuppressive drugs, initial encounter: Secondary | ICD-10-CM

## 2019-12-27 DIAGNOSIS — Z95828 Presence of other vascular implants and grafts: Secondary | ICD-10-CM

## 2019-12-27 DIAGNOSIS — Z0000 Encounter for general adult medical examination without abnormal findings: Secondary | ICD-10-CM | POA: Diagnosis not present

## 2019-12-27 DIAGNOSIS — Z79899 Other long term (current) drug therapy: Secondary | ICD-10-CM

## 2019-12-27 DIAGNOSIS — N952 Postmenopausal atrophic vaginitis: Secondary | ICD-10-CM

## 2019-12-27 DIAGNOSIS — Z13220 Encounter for screening for lipoid disorders: Secondary | ICD-10-CM | POA: Diagnosis not present

## 2019-12-27 DIAGNOSIS — G620 Drug-induced polyneuropathy: Secondary | ICD-10-CM

## 2019-12-27 DIAGNOSIS — Z170 Estrogen receptor positive status [ER+]: Secondary | ICD-10-CM

## 2019-12-27 DIAGNOSIS — N760 Acute vaginitis: Secondary | ICD-10-CM

## 2019-12-27 DIAGNOSIS — Z0001 Encounter for general adult medical examination with abnormal findings: Secondary | ICD-10-CM

## 2019-12-27 MED ORDER — TRIAMCINOLONE ACETONIDE 0.1 % EX OINT: 1.0000 "application " | TOPICAL_OINTMENT | Freq: Two times a day (BID) | CUTANEOUS | 1 refills | Status: AC

## 2019-12-27 MED ORDER — NYSTATIN 100000 UNIT/GM EX OINT: 1.0000 "application " | TOPICAL_OINTMENT | Freq: Two times a day (BID) | CUTANEOUS | 0 refills | Status: DC

## 2019-12-27 NOTE — Patient Instructions (Addendum)
Hannah Woodard and [facility]  Dexa Not due until 08/2020 and I think GYN or Onc requested it.   VAGINITIS  Can use nystatin and triamcinolone together twice a day for 7-10 days Use more of the nystatin and less of the triamcinolone externally Sometimes this can be from vaginal dryness as well and these creams can help symptoms but the treatment is a vaginal estrogen that we can discuss at your next visit if this helps.    Vaginitis Vaginitis is an inflammation of the vagina. It can happen when the normal bacteria and yeast in the vagina grow too much. There are different types. Treatment will depend on the type you have. Follow these instructions at home:  Take all medicines as told by your doctor.  Keep your vagina area clean and dry. Avoid soap. Rinse the area with water.  Avoid washing and cleaning out the vagina (douching).  Do not use tampons or have sex (intercourse) until your treatment is done.  Wipe from front to back after going to the restroom.  Wear cotton underwear.  Avoid wearing underwear while you sleep until your vaginitis is gone.  Avoid tight pants. Avoid underwear or nylons without a cotton panel.  Take off wet clothing (such as a bathing suit) as soon as you can.  Use mild, unscented products. Avoid fabric softeners and scented: ? Feminine sprays. ? Laundry detergents. ? Tampons. ? Soaps or bubble baths.  Practice safe sex and use condoms. Get help right away if:  You have belly (abdominal) pain.  You have a fever or lasting symptoms for more than 2-3 days.  You have a fever and your symptoms suddenly get worse. This information is not intended to replace advice given to you by your health care provider. Make sure you discuss any questions you have with your health care provider. Document Released: 12/11/2008 Document Revised: 02/20/2016 Document Reviewed: 02/25/2012 Elsevier Interactive Patient Education  2017 [place].    Take omeprazole over  the counter for 2 weeks, then go to zantac 150-300 mg OR pepcid 20 or 62m at night for 2 weeks, then you can stop or continue as needed.  Avoid alcohol, spicy foods, NSAIDS (aleve, ibuprofen) at this time. See foods below.   Food Choices for Gastroesophageal Reflux Disease When you have gastroesophageal reflux disease (GERD), the foods you eat and your eating habits are very important. Choosing the right foods can help ease the discomfort of GERD. WHAT GENERAL GUIDELINES DO I NEED TO FOLLOW?  Choose fruits, vegetables, whole grains, low-fat dairy products, and low-fat meat, fish, and poultry.  Limit fats such as oils, salad dressings, butter, nuts, and avocado.  Keep a food diary to identify foods that cause symptoms.  Avoid foods that cause reflux. These may be different for different people.  Eat frequent small meals instead of three large meals each day.  Eat your meals slowly, in a relaxed setting.  Limit fried foods.  Cook foods using methods other than frying.  Avoid drinking alcohol.  Avoid drinking large amounts of liquids with your meals.  Avoid bending over or lying down until 2-3 hours after eating. WHAT FOODS ARE NOT RECOMMENDED? The following are some foods and drinks that may worsen your symptoms: Vegetables Tomatoes. Tomato juice. Tomato and spaghetti sauce. Chili peppers. Onion and garlic. Horseradish. Fruits Oranges, grapefruit, and lemon (fruit and juice). Meats High-fat meats, fish, and poultry. This includes hot dogs, ribs, ham, sausage, salami, and bacon. Dairy Whole milk and chocolate milk. Sour  cream. Cream. Butter. Ice cream. Cream cheese.  Beverages Coffee and tea, with or without caffeine. Carbonated beverages or energy drinks. Condiments Hot sauce. Barbecue sauce.  Sweets/Desserts Chocolate and cocoa. Donuts. Peppermint and spearmint. Fats and Oils High-fat foods, including [place] fries and potato chips. Other Vinegar. Strong spices, such as  black pepper, white pepper, red pepper, cayenne, curry powder, cloves, ginger, and chili powder.

## 2019-12-30 LAB — COMPLETE METABOLIC PANEL WITH GFR
AG Ratio: 2 (calc) (ref 1.0–2.5)
ALT: 25 U/L (ref 6–29)
AST: 22 U/L (ref 10–35)
Albumin: 4.7 g/dL (ref 3.6–5.1)
Alkaline phosphatase (APISO): 94 U/L (ref 31–125)
BUN/Creatinine Ratio: 22 (calc) (ref 6–22)
BUN: 10 mg/dL (ref 7–25)
CO2: 33 mmol/L — ABNORMAL HIGH (ref 20–32)
Calcium: 9.7 mg/dL (ref 8.6–10.2)
Chloride: 102 mmol/L (ref 98–110)
Creat: 0.46 mg/dL — ABNORMAL LOW (ref 0.50–1.10)
GFR, Est African American: 138 mL/min/{1.73_m2} (ref >=60)
GFR, Est Non African American: 119 mL/min/{1.73_m2} (ref >=60)
Globulin: 2.4 g/dL (calc) (ref 1.9–3.7)
Glucose, Bld: 101 mg/dL — ABNORMAL HIGH (ref 65–99)
Potassium: 4.1 mmol/L (ref 3.5–5.3)
Sodium: 140 mmol/L (ref 135–146)
Total Bilirubin: 0.9 mg/dL (ref 0.2–1.2)
Total Protein: 7.1 g/dL (ref 6.1–8.1)

## 2019-12-30 LAB — URINE CULTURE
MICRO NUMBER:: 10314917
SPECIMEN QUALITY:: ADEQUATE

## 2019-12-30 LAB — URINALYSIS, ROUTINE W REFLEX MICROSCOPIC
Bilirubin Urine: NEGATIVE
Glucose, UA: NEGATIVE
Hgb urine dipstick: NEGATIVE
Ketones, ur: NEGATIVE
Leukocytes,Ua: NEGATIVE
Nitrite: NEGATIVE
Protein, ur: NEGATIVE
Specific Gravity, Urine: 1.017 (ref 1.001–1.03)
pH: 7.5 (ref 5.0–8.0)

## 2019-12-30 LAB — CBC WITH DIFFERENTIAL/PLATELET
Absolute Monocytes: 304 cells/uL (ref 200–950)
Basophils Absolute: 20 cells/uL (ref 0–200)
Basophils Relative: 0.5 %
Eosinophils Absolute: 152 cells/uL (ref 15–500)
Eosinophils Relative: 3.9 %
HCT: 43.1 % (ref 35.0–45.0)
Hemoglobin: 14.7 g/dL (ref 11.7–15.5)
Lymphs Abs: 998 cells/uL (ref 850–3900)
MCH: 34.1 pg — ABNORMAL HIGH (ref 27.0–33.0)
MCHC: 34.1 g/dL (ref 32.0–36.0)
MCV: 100 fL (ref 80.0–100.0)
MPV: 8.9 fL (ref 7.5–12.5)
Monocytes Relative: 7.8 %
Neutro Abs: 2426 cells/uL (ref 1500–7800)
Neutrophils Relative %: 62.2 %
Platelets: 242 10*3/uL (ref 140–400)
RBC: 4.31 10*6/uL (ref 3.80–5.10)
RDW: 12.1 % (ref 11.0–15.0)
Total Lymphocyte: 25.6 %
WBC: 3.9 10*3/uL (ref 3.8–10.8)

## 2019-12-30 LAB — VITAMIN D 25 HYDROXY (VIT D DEFICIENCY, FRACTURES): Vit D, 25-Hydroxy: 67 ng/mL (ref 30–100)

## 2019-12-30 LAB — LIPID PANEL
Cholesterol: 204 mg/dL — ABNORMAL HIGH (ref <200)
HDL: 62 mg/dL (ref >=50)
LDL Cholesterol (Calc): 126 mg/dL (calc) — ABNORMAL HIGH
Non-HDL Cholesterol (Calc): 142 mg/dL (calc) — ABNORMAL HIGH (ref <130)
Total CHOL/HDL Ratio: 3.3 (calc) (ref <5.0)
Triglycerides: 70 mg/dL (ref <150)

## 2019-12-30 LAB — MICROALBUMIN / CREATININE URINE RATIO
Creatinine, Urine: 73 mg/dL (ref 20–275)
Microalb Creat Ratio: 11 mcg/mg creat (ref <30)
Microalb, Ur: 0.8 mg/dL

## 2019-12-30 LAB — HEMOGLOBIN A1C
Hgb A1c MFr Bld: 4.8 % of total Hgb (ref <5.7)
Mean Plasma Glucose: 91 (calc)
eAG (mmol/L): 5 (calc)

## 2019-12-30 LAB — WET PREP BY MOLECULAR PROBE
Candida species: NOT DETECTED
MICRO NUMBER:: 10315100
SPECIMEN QUALITY:: ADEQUATE
Trichomonas vaginosis: NOT DETECTED

## 2019-12-30 LAB — MAGNESIUM: Magnesium: 2.3 mg/dL (ref 1.5–2.5)

## 2019-12-30 LAB — TSH: TSH: 1.61 mIU/L

## 2019-12-31 ENCOUNTER — Other Ambulatory Visit: Payer: Self-pay | Admitting: Physician Assistant

## 2019-12-31 MED ORDER — METRONIDAZOLE 0.75 % VA GEL: 1.0000 | Freq: Every day | VAGINAL | 0 refills | Status: DC

## 2020-01-03 ENCOUNTER — Ambulatory Visit: Payer: BC Managed Care – PPO

## 2020-01-03 ENCOUNTER — Inpatient Hospital Stay: Payer: BC Managed Care – PPO | Attending: Oncology

## 2020-01-03 ENCOUNTER — Other Ambulatory Visit: Payer: Self-pay

## 2020-01-03 VITALS — BP 125/76 | HR 80 | Temp 98.2°F | Resp 18

## 2020-01-03 DIAGNOSIS — C50211 Malignant neoplasm of upper-inner quadrant of right female breast: Secondary | ICD-10-CM | POA: Insufficient documentation

## 2020-01-03 DIAGNOSIS — Z170 Estrogen receptor positive status [ER+]: Secondary | ICD-10-CM | POA: Insufficient documentation

## 2020-01-03 DIAGNOSIS — Z95828 Presence of other vascular implants and grafts: Secondary | ICD-10-CM

## 2020-01-03 DIAGNOSIS — Z79818 Long term (current) use of other agents affecting estrogen receptors and estrogen levels: Secondary | ICD-10-CM | POA: Diagnosis not present

## 2020-01-03 MED ORDER — GOSERELIN ACETATE 3.6 MG ~~LOC~~ IMPL
3.6000 mg | DRUG_IMPLANT | Freq: Once | SUBCUTANEOUS | Status: AC
  Administered 2020-01-03: 3.6 mg via SUBCUTANEOUS

## 2020-01-03 MED ORDER — GOSERELIN ACETATE 3.6 MG ~~LOC~~ IMPL
DRUG_IMPLANT | SUBCUTANEOUS | Status: AC
  Filled 2020-01-03: qty 3.6

## 2020-01-03 NOTE — Patient Instructions (Signed)

## 2020-01-05 ENCOUNTER — Ambulatory Visit: Payer: BC Managed Care – PPO

## 2020-01-12 MED ORDER — ONDANSETRON HCL 4 MG PO TABS: 4.0000 mg | ORAL_TABLET | Freq: Every day | ORAL | 1 refills | Status: AC | PRN

## 2020-01-31 ENCOUNTER — Inpatient Hospital Stay: Payer: BC Managed Care – PPO | Attending: Oncology

## 2020-01-31 ENCOUNTER — Ambulatory Visit: Payer: BC Managed Care – PPO

## 2020-01-31 ENCOUNTER — Other Ambulatory Visit: Payer: Self-pay

## 2020-01-31 DIAGNOSIS — Z95828 Presence of other vascular implants and grafts: Secondary | ICD-10-CM

## 2020-01-31 DIAGNOSIS — C50211 Malignant neoplasm of upper-inner quadrant of right female breast: Secondary | ICD-10-CM | POA: Diagnosis not present

## 2020-01-31 DIAGNOSIS — Z79818 Long term (current) use of other agents affecting estrogen receptors and estrogen levels: Secondary | ICD-10-CM | POA: Diagnosis not present

## 2020-01-31 DIAGNOSIS — Z170 Estrogen receptor positive status [ER+]: Secondary | ICD-10-CM | POA: Insufficient documentation

## 2020-01-31 MED ORDER — GOSERELIN ACETATE 3.6 MG ~~LOC~~ IMPL
3.6000 mg | DRUG_IMPLANT | Freq: Once | SUBCUTANEOUS | Status: AC
  Administered 2020-01-31: 08:00:00 3.6 mg via SUBCUTANEOUS

## 2020-01-31 MED ORDER — GOSERELIN ACETATE 3.6 MG ~~LOC~~ IMPL
DRUG_IMPLANT | SUBCUTANEOUS | Status: AC
  Filled 2020-01-31: qty 3.6

## 2020-01-31 NOTE — Patient Instructions (Signed)

## 2020-02-02 ENCOUNTER — Ambulatory Visit: Payer: BC Managed Care – PPO

## 2020-02-28 ENCOUNTER — Ambulatory Visit: Payer: BC Managed Care – PPO

## 2020-02-28 ENCOUNTER — Other Ambulatory Visit: Payer: Self-pay

## 2020-02-28 ENCOUNTER — Inpatient Hospital Stay: Payer: BC Managed Care – PPO | Attending: Oncology

## 2020-02-28 VITALS — BP 119/78 | HR 66 | Resp 20

## 2020-02-28 DIAGNOSIS — Z79818 Long term (current) use of other agents affecting estrogen receptors and estrogen levels: Secondary | ICD-10-CM | POA: Insufficient documentation

## 2020-02-28 DIAGNOSIS — Z95828 Presence of other vascular implants and grafts: Secondary | ICD-10-CM

## 2020-02-28 DIAGNOSIS — Z170 Estrogen receptor positive status [ER+]: Secondary | ICD-10-CM | POA: Diagnosis not present

## 2020-02-28 DIAGNOSIS — C50211 Malignant neoplasm of upper-inner quadrant of right female breast: Secondary | ICD-10-CM | POA: Diagnosis not present

## 2020-02-28 MED ORDER — GOSERELIN ACETATE 3.6 MG ~~LOC~~ IMPL
DRUG_IMPLANT | SUBCUTANEOUS | Status: AC
  Filled 2020-02-28: qty 3.6

## 2020-02-28 MED ORDER — GOSERELIN ACETATE 3.6 MG ~~LOC~~ IMPL
3.6000 mg | DRUG_IMPLANT | Freq: Once | SUBCUTANEOUS | Status: AC
  Administered 2020-02-28: 3.6 mg via SUBCUTANEOUS

## 2020-02-28 NOTE — Patient Instructions (Signed)

## 2020-03-01 ENCOUNTER — Ambulatory Visit: Payer: BC Managed Care – PPO

## 2020-03-08 ENCOUNTER — Other Ambulatory Visit: Payer: Self-pay

## 2020-03-08 ENCOUNTER — Ambulatory Visit (HOSPITAL_COMMUNITY)
Admission: RE | Admit: 2020-03-08 | Discharge: 2020-03-08 | Disposition: A | Discharge status: Home / Self Care | Payer: BC Managed Care – PPO | Source: Ambulatory Visit | Attending: Gynecologic Oncology | Admitting: Gynecologic Oncology

## 2020-03-08 ENCOUNTER — Inpatient Hospital Stay: Payer: BC Managed Care – PPO

## 2020-03-08 DIAGNOSIS — C50211 Malignant neoplasm of upper-inner quadrant of right female breast: Secondary | ICD-10-CM | POA: Diagnosis not present

## 2020-03-08 DIAGNOSIS — Z8041 Family history of malignant neoplasm of ovary: Secondary | ICD-10-CM

## 2020-03-09 LAB — CA 125: Cancer Antigen (CA) 125: 14.8 U/mL (ref 0.0–38.1)

## 2020-03-11 ENCOUNTER — Telehealth: Payer: Self-pay

## 2020-03-11 NOTE — Telephone Encounter (Signed)
Attempted to reach patient to inform of [place] results but voice mail box is full.

## 2020-03-12 NOTE — Telephone Encounter (Signed)
Told husband that the CA-125 was WNL at 14.8 and the [place] was normal as well per Joylene John, NP. Requested  That Hannah Woodard call the office in the next couple of weeks to schedule her f/u appointment in December and [place] and CA-125 prior to visit. Husband will relay message to [place] as she is at work.

## 2020-03-14 ENCOUNTER — Telehealth: Payer: Self-pay | Admitting: *Deleted

## 2020-03-14 NOTE — Telephone Encounter (Signed)
Patient returned call and stated "I got a call from my husband that I needed to call and schedule US/lab/MD visit for December." Explained that she already had lab/US for 12/27 and we that Dr Serita Grit schedule is full for December. Explained that I would call her back to schedule her follow up appt

## 2020-03-27 ENCOUNTER — Inpatient Hospital Stay: Payer: BC Managed Care – PPO

## 2020-03-27 ENCOUNTER — Ambulatory Visit: Payer: BC Managed Care – PPO

## 2020-03-27 ENCOUNTER — Other Ambulatory Visit: Payer: Self-pay

## 2020-03-27 VITALS — BP 140/88 | HR 77 | Resp 17

## 2020-03-27 DIAGNOSIS — Z95828 Presence of other vascular implants and grafts: Secondary | ICD-10-CM

## 2020-03-27 DIAGNOSIS — Z170 Estrogen receptor positive status [ER+]: Secondary | ICD-10-CM

## 2020-03-27 DIAGNOSIS — C50211 Malignant neoplasm of upper-inner quadrant of right female breast: Secondary | ICD-10-CM | POA: Diagnosis not present

## 2020-03-27 MED ORDER — GOSERELIN ACETATE 3.6 MG ~~LOC~~ IMPL
3.6000 mg | DRUG_IMPLANT | Freq: Once | SUBCUTANEOUS | Status: AC
  Administered 2020-03-27: 3.6 mg via SUBCUTANEOUS

## 2020-03-27 MED ORDER — GOSERELIN ACETATE 3.6 MG ~~LOC~~ IMPL
DRUG_IMPLANT | SUBCUTANEOUS | Status: AC
  Filled 2020-03-27: qty 3.6

## 2020-03-27 NOTE — Patient Instructions (Signed)

## 2020-03-29 ENCOUNTER — Ambulatory Visit: Payer: BC Managed Care – PPO

## 2020-04-17 ENCOUNTER — Other Ambulatory Visit: Payer: Self-pay | Admitting: Adult Health

## 2020-04-17 DIAGNOSIS — Z170 Estrogen receptor positive status [ER+]: Secondary | ICD-10-CM

## 2020-04-17 DIAGNOSIS — G620 Drug-induced polyneuropathy: Secondary | ICD-10-CM

## 2020-04-24 ENCOUNTER — Inpatient Hospital Stay: Payer: BC Managed Care – PPO | Attending: Oncology

## 2020-04-24 ENCOUNTER — Other Ambulatory Visit: Payer: Self-pay

## 2020-04-24 ENCOUNTER — Other Ambulatory Visit: Payer: Self-pay | Admitting: *Deleted

## 2020-04-24 ENCOUNTER — Inpatient Hospital Stay: Payer: BC Managed Care – PPO

## 2020-04-24 ENCOUNTER — Inpatient Hospital Stay (HOSPITAL_BASED_OUTPATIENT_CLINIC_OR_DEPARTMENT_OTHER): Payer: BC Managed Care – PPO | Admitting: Oncology

## 2020-04-24 VITALS — BP 114/68 | HR 81 | Temp 98.0°F | Resp 17 | Ht 66.0 in | Wt 158.1 lb

## 2020-04-24 DIAGNOSIS — Z79899 Other long term (current) drug therapy: Secondary | ICD-10-CM | POA: Insufficient documentation

## 2020-04-24 DIAGNOSIS — Z79811 Long term (current) use of aromatase inhibitors: Secondary | ICD-10-CM | POA: Diagnosis not present

## 2020-04-24 DIAGNOSIS — Z8042 Family history of malignant neoplasm of prostate: Secondary | ICD-10-CM | POA: Insufficient documentation

## 2020-04-24 DIAGNOSIS — C50211 Malignant neoplasm of upper-inner quadrant of right female breast: Secondary | ICD-10-CM

## 2020-04-24 DIAGNOSIS — Z8041 Family history of malignant neoplasm of ovary: Secondary | ICD-10-CM | POA: Diagnosis not present

## 2020-04-24 DIAGNOSIS — R922 Inconclusive mammogram: Secondary | ICD-10-CM | POA: Diagnosis not present

## 2020-04-24 DIAGNOSIS — Z95828 Presence of other vascular implants and grafts: Secondary | ICD-10-CM

## 2020-04-24 DIAGNOSIS — R232 Flushing: Secondary | ICD-10-CM | POA: Insufficient documentation

## 2020-04-24 DIAGNOSIS — T451X5A Adverse effect of antineoplastic and immunosuppressive drugs, initial encounter: Secondary | ICD-10-CM

## 2020-04-24 DIAGNOSIS — Z8673 Personal history of transient ischemic attack (TIA), and cerebral infarction without residual deficits: Secondary | ICD-10-CM | POA: Diagnosis not present

## 2020-04-24 DIAGNOSIS — G620 Drug-induced polyneuropathy: Secondary | ICD-10-CM

## 2020-04-24 DIAGNOSIS — Z170 Estrogen receptor positive status [ER+]: Secondary | ICD-10-CM | POA: Insufficient documentation

## 2020-04-24 LAB — CBC WITH DIFFERENTIAL (CANCER CENTER ONLY)
Abs Immature Granulocytes: 0.01 10*3/uL (ref 0.00–0.07)
Basophils Absolute: 0 10*3/uL (ref 0.0–0.1)
Basophils Relative: 1 %
Eosinophils Absolute: 0.6 10*3/uL — ABNORMAL HIGH (ref 0.0–0.5)
Eosinophils Relative: 10 %
HCT: 39 % (ref 36.0–46.0)
Hemoglobin: 13.7 g/dL (ref 12.0–15.0)
Immature Granulocytes: 0 %
Lymphocytes Relative: 25 %
Lymphs Abs: 1.4 10*3/uL (ref 0.7–4.0)
MCH: 34.5 pg — ABNORMAL HIGH (ref 26.0–34.0)
MCHC: 35.1 g/dL (ref 30.0–36.0)
MCV: 98.2 fL (ref 80.0–100.0)
Monocytes Absolute: 0.5 10*3/uL (ref 0.1–1.0)
Monocytes Relative: 9 %
Neutro Abs: 3.2 10*3/uL (ref 1.7–7.7)
Neutrophils Relative %: 55 %
Platelet Count: 224 10*3/uL (ref 150–400)
RBC: 3.97 MIL/uL (ref 3.87–5.11)
RDW: 12.6 % (ref 11.5–15.5)
WBC Count: 5.7 10*3/uL (ref 4.0–10.5)
nRBC: 0 % (ref 0.0–0.2)

## 2020-04-24 LAB — CMP (CANCER CENTER ONLY)
ALT: 21 U/L (ref 0–44)
AST: 24 U/L (ref 15–41)
Albumin: 4.2 g/dL (ref 3.5–5.0)
Alkaline Phosphatase: 100 U/L (ref 38–126)
Anion gap: 9 (ref 5–15)
BUN: 9 mg/dL (ref 6–20)
CO2: 29 mmol/L (ref 22–32)
Calcium: 10.1 mg/dL (ref 8.9–10.3)
Chloride: 103 mmol/L (ref 98–111)
Creatinine: 0.7 mg/dL (ref 0.44–1.00)
GFR, Est AFR Am: >60 mL/min (ref >60)
GFR, Estimated: >60 mL/min (ref >60)
Glucose, Bld: 97 mg/dL (ref 70–99)
Potassium: 4.4 mmol/L (ref 3.5–5.1)
Sodium: 141 mmol/L (ref 135–145)
Total Bilirubin: 0.7 mg/dL (ref 0.3–1.2)
Total Protein: 7 g/dL (ref 6.5–8.1)

## 2020-04-24 MED ORDER — GOSERELIN ACETATE 3.6 MG ~~LOC~~ IMPL
DRUG_IMPLANT | SUBCUTANEOUS | Status: AC
  Filled 2020-04-24: qty 3.6

## 2020-04-24 MED ORDER — ANASTROZOLE 1 MG PO TABS: 1.0000 mg | ORAL_TABLET | Freq: Every day | ORAL | 4 refills | Status: DC

## 2020-04-24 MED ORDER — GABAPENTIN 300 MG PO CAPS: 300.0000 mg | ORAL_CAPSULE | Freq: Every day | ORAL | 4 refills | Status: DC

## 2020-04-24 MED ORDER — GOSERELIN ACETATE 3.6 MG ~~LOC~~ IMPL
3.6000 mg | DRUG_IMPLANT | Freq: Once | SUBCUTANEOUS | Status: AC
  Administered 2020-04-24: 3.6 mg via SUBCUTANEOUS

## 2020-04-24 NOTE — Patient Instructions (Signed)

## 2020-04-24 NOTE — Progress Notes (Signed)
[place]  [phone] [phone]     ID: [name] DOB: 04-Sep-1971  MR#: [id number]  BSJ#:[id number]  Patient Care Team: Unk Pinto, MD as PCP - General (Internal Medicine) Jovita Kussmaul, MD as Consulting Physician (General Surgery) Gaberial Cada, Virgie Dad, MD as Consulting Physician (Oncology) Kyung Rudd, MD as Consulting Physician (Radiation Oncology) Lorelle Gibbs, MD (Radiology) Angelina Ok, MD as Referring Physician (Surgery) Everitt Amber, MD as Consulting Physician (Gynecologic Oncology) OTHER MD:  CHIEF COMPLAINT: Triple positive breast cancer  CURRENT TREATMENT: anastrozole, Zoladex   INTERVAL HISTORY: Hannah Woodard returns today for follow-up of her triple positive breast cancer.   She continues on anastrozole.  Hot flashes are the major concern.  She receives Zoladex every 4 weeks, with a dose due today.  We have discussed going to every 3 months shots but she really does not mind coming every 4 weeks and tolerates the treatment well.  Her most recent bone density was on 09/05/2018 at [facility] and showed a T-score of -0.7.  Since her last visit, she underwent bilateral diagnostic mammography with tomography and left breast ultrasonography at [facility] on 11/15/2019 showing: breast density category C; calcification in far posterior right breast near lumpectomy site, short-term follow-up recommended; left breast mass, likely representing fat necrosis.   Her most recent pelvic ultrasound performed on 03/08/2020 showed no suspicious ovarian or adnexal mass. She is scheduled for repeat on 09/23/2020.    REVIEW OF SYSTEMS: Hannah Woodard works part of the summer but is currently off.  She is very worried about her dog who does have cancer and the variety of other issues and is 47 years old.  Occasionally she gets a bruise from the Zoladex.  She still has peripheral neuropathy which is worse when she is dehydrated.  At night she  uses gabapentin and also takes magnesium which helps.  She has not received the COVID-19 vaccine and at this point is not planning to.  A detailed review of systems today was otherwise stable   HISTORY OF CURRENT ILLNESS: From the original intake note:  The patient has a history of cysts and nodules in the right breast which have been closely followed, with exams 07/30/2016 and 01/28/2017.  On 07/29/2017 follow-up right breast ultrasonography showed no findings of concern.  Bilateral diagnostic mammography 08/18/2017 with right breast ultrasonography on 08/18/2017 at [facility] found the breast density to be category C.  There was now a new irregular high density mass in the posterior portion of the right breast seen on the mediolateral oblique view only.  Ultrasonography confirmed a 1.4 cm lobulated mass in the upper inner quadrant of the right breast 10 cm from the nipple associated with pectoral muscle invasion.  The right axilla was sonographically benign.  Biopsy of this mass 08/24/2017 showed ([id number]) invasive ductal carcinoma, grade 2, estrogen receptor 95% positive, progesterone receptor 100% positive, both with strong staining intensity, with an MIB-1 of 60%, and HER-2 amplified, with a signals ratio of 2.84 (full report pending).  The patient's subsequent history is as detailed below.   PAST MEDICAL HISTORY: Past Medical History:  Diagnosis Date  . Breast cancer ([place])   . Family history of ovarian cancer 09/02/2017  . Family history of prostate cancer in father 09/02/2017  . Family history of uterine cancer 09/02/2017  . Plantar fasciitis     PAST SURGICAL HISTORY: Past Surgical History:  Procedure Laterality Date  . right lumpectomy, sentinel node biopsy, and bilateral mammoplasty  10/2017  [place]    FAMILY HISTORY Family History  Problem Relation Age of Onset  . Ovarian cancer Mother 66  . Prostate cancer Father 74       'high gleason' unsure number  . Ulcerative  colitis Sister   . Other Sister        abn ovaian growth, partial hysterectomy  . Uterine cancer Maternal Aunt 84  . Stroke Maternal Aunt 66  . Basal cell carcinoma Brother   . Skin cancer Paternal Uncle   . Skin cancer Maternal Grandmother   . Stroke Paternal Grandfather 32  . Hydrocephalus Cousin   The patient's father died from alcoholic cirrhosis at age 79.  He had been diagnosed with prostate cancer at age 94.  The patient's mother was diagnosed with ovarian cancer at age 26 and died a year later.  The patient has 1 brother, 1 sister.  The patient's brother has had basal cell and other skin cancers.  A paternal aunt had uterine cancer.  Other relatives have had skin cancers but the patient does not know if these were melanomas or not   GYNECOLOGIC HISTORY:  Patient's last menstrual period was 10/05/2017.  Menarche age 46, the patient has never been pregnant.  She is still having regular periods.  She used oral contraceptives briefly in the past without complications. She and her husband are very interested in having children, and currently (December 2018) they are not using any contraception.   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  Her husband Rodman Key (goes by [employer]") is a Dealer.  At home it's just them, a [employer] and a cat.   ADVANCED DIRECTIVES: Not in place   HEALTH MAINTENANCE: Social History   Tobacco Use  . Smoking status: Never Smoker  . Smokeless tobacco: Never Used  Vaping Use  . Vaping Use: Never used  Substance Use Topics  . Alcohol use: Yes  . Drug use: No     Colonoscopy: Never  PAP:  Bone density: Never   Allergies  Allergen Reactions  . Adhesive [Tape]   . Milk-Related Compounds Diarrhea    Stomach cramping  . Bactrim [Sulfamethoxazole-Trimethoprim] Rash  . Poison Ivy Extract [Poison Ivy Extract] Hives, Itching and Rash    Current Outpatient Medications    Medication Sig Dispense Refill  . acetic acid-hydrocortisone (VOSOL-HC) OTIC solution Place 3 drops into the right ear 2 (two) times daily. (Patient not taking: Reported on 12/27/2019) 10 mL 1  . anastrozole (ARIMIDEX) 1 MG tablet Take 1 tablet (1 mg total) by mouth daily. 90 tablet 4  . Ascorbic Acid (VITAMIN C PO) Take 400 Units by mouth daily.     Marland Kitchen b complex vitamins tablet Take 1 tablet by mouth daily.    . Cholecalciferol (VITAMIN D3) 5000 units TABS Take 10,000 Units by mouth daily.    . COLLAGEN PO Take by mouth daily. Takes beef collagen    . ferrous sulfate 325 (65 FE) MG tablet Take 325 mg by mouth daily with breakfast.    . fish oil-omega-3 fatty acids 1000 MG capsule Take 1 g by mouth daily.    Marland Kitchen gabapentin (NEURONTIN) 300 MG capsule TAKE 1 CAPSULE BY MOUTH EVERYDAY AT BEDTIME 90 capsule 4  . ketoconazole (NIZORAL) 2 % cream APPLY TO AFFECTED AREA EVERY DAY (Patient not taking: Reported on 12/27/2019) 15 g 0  . Magnesium Citrate POWD by Does not apply route daily.    Marland Kitchen  metroNIDAZOLE (METROGEL) 0.75 % vaginal gel Place 1 Applicatorful vaginally at bedtime. 1 applicator at night for 5 days 70 g 0  . nystatin ointment (MYCOSTATIN) Apply 1 application topically 2 (two) times daily. 30 g 0  . ondansetron (ZOFRAN) 4 MG tablet Take 1 tablet (4 mg total) by mouth daily as needed for nausea or vomiting (Not sedating). 30 tablet 1  . OVER THE COUNTER MEDICATION Take 1 tablet by mouth at bedtime as needed (constipation).     . triamcinolone ointment (KENALOG) 0.1 % Apply 1 application topically 2 (two) times daily. 80 g 1  . TURMERIC PO Take 1 capsule by mouth as directed.     Marland Kitchen UNABLE TO FIND Take 500 mg by mouth daily. [place] mushroom    . UNABLE TO FIND Take 500 mg by mouth daily. [place] Mushroom    . Zinc 30 MG TABS Take by mouth daily.     No current facility-administered medications for this visit.    OBJECTIVE:  white woman who appears younger than stated age  68:    04/24/20 1519  BP: 114/68  Pulse: 81  Resp: 17  Temp: 98 F (36.7 C)  SpO2: 98%     Body mass index is 25.52 kg/m.   Wt Readings from Last 3 Encounters:  04/24/20 158 lb 1.6 oz (71.7 kg)  12/27/19 163 lb (73.9 kg)  12/06/19 165 lb 3.2 oz (74.9 kg)   ECOG FS:1 - Symptomatic but completely ambulatory  Strong brown curly hair Sclerae unicteric, EOMs intact Wearing a mask No cervical or supraclavicular adenopathy Lungs no rales or rhonchi Heart regular rate and rhythm Abd soft, nontender, positive bowel sounds MSK no focal spinal tenderness, no upper extremity lymphedema Neuro: nonfocal, well oriented, appropriate affect Breasts: Status post bilateral reduction mammoplasty as well as right lumpectomy and radiation.  No evidence of local recurrence.  Excellent cosmetic result.  Both axillae are benign.   LAB RESULTS:  CMP     Component Value Date/Time   NA 140 12/27/2019 1546   NA 138 09/01/2017 1238   K 4.1 12/27/2019 1546   K 4.4 09/01/2017 1238   CL 102 12/27/2019 1546   CO2 33 (H) 12/27/2019 1546   CO2 25 09/01/2017 1238   GLUCOSE 101 (H) 12/27/2019 1546   GLUCOSE 83 09/01/2017 1238   BUN 10 12/27/2019 1546   BUN 9.2 09/01/2017 1238   CREATININE 0.46 (L) 12/27/2019 1546   CREATININE 0.7 09/01/2017 1238   CALCIUM 9.7 12/27/2019 1546   CALCIUM 9.4 09/01/2017 1238   PROT 7.1 12/27/2019 1546   PROT 7.4 09/01/2017 1238   ALBUMIN 4.3 04/26/2019 1459   ALBUMIN 4.2 09/01/2017 1238   AST 22 12/27/2019 1546   AST 23 04/26/2019 1459   AST 21 09/01/2017 1238   ALT 25 12/27/2019 1546   ALT 26 04/26/2019 1459   ALT 21 09/01/2017 1238   ALKPHOS 93 04/26/2019 1459   ALKPHOS 60 09/01/2017 1238   BILITOT 0.9 12/27/2019 1546   BILITOT 0.8 04/26/2019 1459   BILITOT 0.65 09/01/2017 1238   GFRNONAA 119 12/27/2019 1546   GFRAA 138 12/27/2019 1546    No results found for: TOTALPROTELP, ALBUMINELP, A1GS, A2GS, BETS, BETA2SER, GAMS, MSPIKE, SPEI  No results found for:  KPAFRELGTCHN, LAMBDASER, KAPLAMBRATIO  Lab Results  Component Value Date   WBC 5.7 04/24/2020   NEUTROABS 3.2 04/24/2020   HGB 13.7 04/24/2020   HCT 39.0 04/24/2020   MCV 98.2 04/24/2020   PLT 224  04/24/2020      Chemistry      Component Value Date/Time   NA 140 12/27/2019 1546   NA 138 09/01/2017 1238   K 4.1 12/27/2019 1546   K 4.4 09/01/2017 1238   CL 102 12/27/2019 1546   CO2 33 (H) 12/27/2019 1546   CO2 25 09/01/2017 1238   BUN 10 12/27/2019 1546   BUN 9.2 09/01/2017 1238   CREATININE 0.46 (L) 12/27/2019 1546   CREATININE 0.7 09/01/2017 1238      Component Value Date/Time   CALCIUM 9.7 12/27/2019 1546   CALCIUM 9.4 09/01/2017 1238   ALKPHOS 93 04/26/2019 1459   ALKPHOS 60 09/01/2017 1238   AST 22 12/27/2019 1546   AST 23 04/26/2019 1459   AST 21 09/01/2017 1238   ALT 25 12/27/2019 1546   ALT 26 04/26/2019 1459   ALT 21 09/01/2017 1238   BILITOT 0.9 12/27/2019 1546   BILITOT 0.8 04/26/2019 1459   BILITOT 0.65 09/01/2017 1238       No results found for: LABCA2  No components found for: VCBSWH675  No results for input(s): INR in the last 168 hours.  No results found for: LABCA2  No results found for: CAN199  Lab Results  Component Value Date   CAN125 14.8 03/08/2020    No results found for: FFM384  No results found for: CA2729  No components found for: HGQUANT  No results found for: CEA1 / No results found for: CEA1   No results found for: AFPTUMOR  No results found for: CHROMOGRNA  No results found for: PSA1  Appointment on 04/24/2020  Component Date Value Ref Range Status  . WBC Count 04/24/2020 5.7  4.0 - 10.5 K/uL Final  . RBC 04/24/2020 3.97  3.87 - 5.11 MIL/uL Final  . Hemoglobin 04/24/2020 13.7  12.0 - 15.0 g/dL Final  . HCT 04/24/2020 39.0  36 - 46 % Final  . MCV 04/24/2020 98.2  80.0 - 100.0 fL Final  . MCH 04/24/2020 34.5* 26.0 - 34.0 pg Final  . MCHC 04/24/2020 35.1  30.0 - 36.0 g/dL Final  . RDW 04/24/2020 12.6  11.5 -  15.5 % Final  . Platelet Count 04/24/2020 224  150 - 400 K/uL Final  . nRBC 04/24/2020 0.0  0.0 - 0.2 % Final  . Neutrophils Relative % 04/24/2020 55  % Final  . Neutro Abs 04/24/2020 3.2  1.7 - 7.7 K/uL Final  . Lymphocytes Relative 04/24/2020 25  % Final  . Lymphs Abs 04/24/2020 1.4  0.7 - 4.0 K/uL Final  . Monocytes Relative 04/24/2020 9  % Final  . Monocytes Absolute 04/24/2020 0.5  0 - 1 K/uL Final  . Eosinophils Relative 04/24/2020 10  % Final  . Eosinophils Absolute 04/24/2020 0.6* 0 - 0 K/uL Final  . Basophils Relative 04/24/2020 1  % Final  . Basophils Absolute 04/24/2020 0.0  0 - 0 K/uL Final  . Immature Granulocytes 04/24/2020 0  % Final  . Abs Immature Granulocytes 04/24/2020 0.01  0.00 - 0.07 K/uL Final   Performed at [facility], [place] [address], [place], [place] [address]    (this displays the last labs from the last 3 days)  No results found for: TOTALPROTELP, ALBUMINELP, A1GS, A2GS, BETS, BETA2SER, GAMS, MSPIKE, SPEI (this displays SPEP labs)  No results found for: KPAFRELGTCHN, LAMBDASER, KAPLAMBRATIO (kappa/lambda light chains)  No results found for: HGBA, HGBA2QUANT, HGBFQUANT, HGBSQUAN (Hemoglobinopathy evaluation)   No results found for: LDH  Lab  Results  Component Value Date   IRON 165 12/27/2018   TIBC 330 12/27/2018   IRONPCTSAT 50 (H) 12/27/2018   (Iron and TIBC)  Lab Results  Component Value Date   FERRITIN 67 12/16/2016    Urinalysis    Component Value Date/Time   COLORURINE YELLOW 12/27/2019 1546   APPEARANCEUR CLOUDY (A) 12/27/2019 1546   LABSPEC 1.017 12/27/2019 1546   PHURINE 7.5 12/27/2019 1546   GLUCOSEU NEGATIVE 12/27/2019 1546   HGBUR NEGATIVE 12/27/2019 1546   BILIRUBINUR NEGATIVE 12/27/2017 1905   KETONESUR NEGATIVE 12/27/2019 1546   PROTEINUR NEGATIVE 12/27/2019 1546   UROBILINOGEN 0.2 12/06/2014 1529   NITRITE NEGATIVE 12/27/2019 1546   LEUKOCYTESUR NEGATIVE 12/27/2019 1546    STUDIES: No  results found.  Bilateral diagnostic mammography with tomography at [facility] 11/15/2019 Calcifications in the far posterior right breast near the lumpectomy site. These would not be amenable to stereotactic biopsy given their location. A short-term follow-up is recommended.   Left breast mass, likely representing fat necrosis.   Breastcomposition: Scattered fibroglandular density.   BI-RADS Category: 3 - Probably benign findings.   RECOMMENDATION: Short Interval Follow-up with bilateral diagnostic mammogram and possible left breast ultrasound in 6 months. Additionally, given that the patient has a far posterior lumpectomy site incompletely evaluated mammographically, yearly MRI is recommended.    ELIGIBLE FOR AVAILABLE RESEARCH PROTOCOL: No  ASSESSMENT: 49 y.o. [place] woman status post right breast upper inner quadrant biopsy 08/24/2017 for a clinical T1c N0 invasive ductal carcinoma, triple positive, with an MIB-1 of 60%  (1) genetics testing 09/10/2017 through the Common Hereditary Cancer Panel + Melanoma Panel found no deleterious mutations in: APC, ATM, AXIN2, BAP1, BARD1, BMPR1A, BRCA1, BRCA2, BRIP1, CDH1, CDK4, CDKN2A (p14ARF), CDKN2A (p16INK4a), CHEK2, CTNNA1, DICER1, EPCAM*, GREM1*, KIT, MEN1, MLH1, MSH2, MSH3, MSH6, MUTYH, NBN, NF1, PALB2, PDGFRA, PMS2, POLD1, POLE, POT1, PTEN, RAD50, RAD51C, RAD51D, RB1, SDHB, SDHC, SDHD, SMAD4, SMARCA4, STK11, TP53, TSC1, TSC2, VHL. The following genes were evaluated for sequence changes only: HOXB13*, MITF*, NTHL1*, SDHA  (2) status post right lumpectomy and sentinel axillary lymph node sampling 10/07/2017 at [facility] for a pT1C pN0, stage IA invasive ductal carcinoma, grade 3, with negative margins  (a) 1 sentinel lymph node removed, bilateral oncoplastic breast reduction  (3) adjuvant chemotherapy consisting of paclitaxel weekly x12 and trastuzumab every 21 days STARTING 11/12/2017  (a) paclitaxel discontinued after 7 doses (last  dose 12/24/2017) due to neuropathy  (4) continued trastuzumab to complete a year (through February 2020).  (a) baseline echocardiogram 09/10/2017 shows an ejection fraction of 55-60%  (b) echocardiogram 12/09/2017 showed an ejection fraction in the 60-65% range  (c) echo in 06/14/2018 shows EF of 60-65%  (d) echo on 10/17/2018 shows EF of 60-65%  (5) adjuvant radiation 02/03/2018 - 03/18/2018 Site/dose:   The patient initially received a dose of 50.4 Gy in 28 fractions to the breast using whole-breast tangent fields. This was delivered using a 3-D conformal technique. The patient then received a boost to the seroma. This delivered an additional 10 Gy in 5 fractions using 9e electrons with a special teletherapy technique. The total dose was 60.4 Gy.   (6) anastrozole started 03/28/2018  (a) bone density 09/05/2018--normal  (b) patient refuses tamoxifen because of family history of strokes and uterine cancer  (c) breast cancer index predicts a risk of late recurrence of 10.6% and suggest significant benefit from continuing endocrine therapy for an extended period  (d)   (7) goserelin started 09/29/2017, discontinued after 08/05/2018  dose, restarted on 11/04/2018 when her menses resumed   PLAN: Hannah Woodard is now a little over 2-1/2 years out from definitive surgery for breast cancer with no evidence of disease recurrence.  This is very favorable.  She is tolerating anastrozole well, with hot flashes as the main issue.  The plan is to continue that for a total of 7 years.  Recall that her breast cancer index suggests she should be on that medication for 7 years.  She is covered with goserelin/Zoladex, with a dose due today.  The plan is to continue that during the time the patient is on anastrozole.  She will have a bone density at [facility] late December.  We discussed switching the goserelin to every 3 months and following the [facility] and estradiol but she likes it the latest so we are continuing with every  4 weeks doses.  She will see me again in 1 year.  She knows to call for any other issue that may develop before the next visit.  Total encounter time 25 minutes.Sarajane Jews C. Layni Kreamer, MD  04/24/20 3:34 PM Medical Oncology and Hematology [facility] [place], [place] [address] Tel. [phone]    Fax. [phone]   I, Wilburn Mylar, am acting as scribe for Dr. Virgie Dad. Holiday Mcmenamin.  I, Lurline Del MD, have reviewed the above documentation for accuracy and completeness, and I agree with the above.   *Total Encounter Time as defined by the Centers for Medicare and Medicaid Services includes, in addition to the face-to-face time of a patient visit (documented in the note above) non-face-to-face time: obtaining and reviewing outside history, ordering and reviewing medications, tests or procedures, care coordination (communications with other health care professionals or caregivers) and documentation in the medical record.

## 2020-05-22 ENCOUNTER — Inpatient Hospital Stay: Payer: BC Managed Care – PPO | Attending: Oncology

## 2020-05-22 DIAGNOSIS — C50211 Malignant neoplasm of upper-inner quadrant of right female breast: Secondary | ICD-10-CM | POA: Insufficient documentation

## 2020-05-22 DIAGNOSIS — Z5111 Encounter for antineoplastic chemotherapy: Secondary | ICD-10-CM | POA: Insufficient documentation

## 2020-05-22 DIAGNOSIS — Z170 Estrogen receptor positive status [ER+]: Secondary | ICD-10-CM | POA: Insufficient documentation

## 2020-05-24 ENCOUNTER — Inpatient Hospital Stay: Payer: BC Managed Care – PPO

## 2020-05-24 ENCOUNTER — Other Ambulatory Visit: Payer: Self-pay

## 2020-05-24 VITALS — BP 121/79 | HR 77 | Resp 18

## 2020-05-24 DIAGNOSIS — Z95828 Presence of other vascular implants and grafts: Secondary | ICD-10-CM

## 2020-05-24 DIAGNOSIS — Z5111 Encounter for antineoplastic chemotherapy: Secondary | ICD-10-CM | POA: Diagnosis present

## 2020-05-24 DIAGNOSIS — C50211 Malignant neoplasm of upper-inner quadrant of right female breast: Secondary | ICD-10-CM | POA: Diagnosis present

## 2020-05-24 DIAGNOSIS — Z170 Estrogen receptor positive status [ER+]: Secondary | ICD-10-CM

## 2020-05-24 MED ORDER — GOSERELIN ACETATE 3.6 MG ~~LOC~~ IMPL
3.6000 mg | DRUG_IMPLANT | Freq: Once | SUBCUTANEOUS | Status: AC
  Administered 2020-05-24: 3.6 mg via SUBCUTANEOUS

## 2020-05-24 MED ORDER — GOSERELIN ACETATE 3.6 MG ~~LOC~~ IMPL
DRUG_IMPLANT | SUBCUTANEOUS | Status: AC
  Filled 2020-05-24: qty 3.6

## 2020-05-24 NOTE — Patient Instructions (Signed)

## 2020-06-11 ENCOUNTER — Telehealth: Payer: Self-pay | Admitting: *Deleted

## 2020-06-11 NOTE — Telephone Encounter (Signed)
Called and scheduled the patient to see Dr Denman George on Dec 29th at 2 pm

## 2020-06-19 ENCOUNTER — Ambulatory Visit: Payer: BC Managed Care – PPO

## 2020-06-21 ENCOUNTER — Inpatient Hospital Stay: Payer: BC Managed Care – PPO | Attending: Oncology

## 2020-06-21 ENCOUNTER — Other Ambulatory Visit: Payer: Self-pay

## 2020-06-21 VITALS — BP 135/91 | HR 78 | Temp 98.5°F | Resp 18

## 2020-06-21 DIAGNOSIS — C50211 Malignant neoplasm of upper-inner quadrant of right female breast: Secondary | ICD-10-CM | POA: Insufficient documentation

## 2020-06-21 DIAGNOSIS — Z5111 Encounter for antineoplastic chemotherapy: Secondary | ICD-10-CM | POA: Diagnosis present

## 2020-06-21 DIAGNOSIS — Z170 Estrogen receptor positive status [ER+]: Secondary | ICD-10-CM | POA: Diagnosis not present

## 2020-06-21 DIAGNOSIS — Z95828 Presence of other vascular implants and grafts: Secondary | ICD-10-CM

## 2020-06-21 MED ORDER — GOSERELIN ACETATE 3.6 MG ~~LOC~~ IMPL
3.6000 mg | DRUG_IMPLANT | Freq: Once | SUBCUTANEOUS | Status: AC
  Administered 2020-06-21: 3.6 mg via SUBCUTANEOUS

## 2020-06-21 MED ORDER — GOSERELIN ACETATE 3.6 MG ~~LOC~~ IMPL
DRUG_IMPLANT | SUBCUTANEOUS | Status: AC
  Filled 2020-06-21: qty 3.6

## 2020-07-17 ENCOUNTER — Ambulatory Visit: Payer: BC Managed Care – PPO

## 2020-07-19 ENCOUNTER — Other Ambulatory Visit: Payer: Self-pay

## 2020-07-19 ENCOUNTER — Inpatient Hospital Stay: Payer: BC Managed Care – PPO | Attending: Oncology

## 2020-07-19 VITALS — BP 135/95 | HR 70 | Resp 18

## 2020-07-19 DIAGNOSIS — Z170 Estrogen receptor positive status [ER+]: Secondary | ICD-10-CM

## 2020-07-19 DIAGNOSIS — C50211 Malignant neoplasm of upper-inner quadrant of right female breast: Secondary | ICD-10-CM | POA: Insufficient documentation

## 2020-07-19 DIAGNOSIS — Z79818 Long term (current) use of other agents affecting estrogen receptors and estrogen levels: Secondary | ICD-10-CM | POA: Insufficient documentation

## 2020-07-19 DIAGNOSIS — Z95828 Presence of other vascular implants and grafts: Secondary | ICD-10-CM

## 2020-07-19 MED ORDER — GOSERELIN ACETATE 3.6 MG ~~LOC~~ IMPL
3.6000 mg | DRUG_IMPLANT | Freq: Once | SUBCUTANEOUS | Status: AC
  Administered 2020-07-19: 3.6 mg via SUBCUTANEOUS

## 2020-07-19 MED ORDER — GOSERELIN ACETATE 3.6 MG ~~LOC~~ IMPL
DRUG_IMPLANT | SUBCUTANEOUS | Status: AC
  Filled 2020-07-19: qty 3.6

## 2020-08-16 ENCOUNTER — Other Ambulatory Visit: Payer: Self-pay

## 2020-08-16 ENCOUNTER — Inpatient Hospital Stay: Payer: BC Managed Care – PPO | Attending: Oncology

## 2020-08-16 VITALS — BP 137/101 | HR 83 | Resp 18

## 2020-08-16 DIAGNOSIS — Z170 Estrogen receptor positive status [ER+]: Secondary | ICD-10-CM

## 2020-08-16 DIAGNOSIS — Z79818 Long term (current) use of other agents affecting estrogen receptors and estrogen levels: Secondary | ICD-10-CM | POA: Diagnosis present

## 2020-08-16 DIAGNOSIS — C50211 Malignant neoplasm of upper-inner quadrant of right female breast: Secondary | ICD-10-CM | POA: Insufficient documentation

## 2020-08-16 DIAGNOSIS — Z95828 Presence of other vascular implants and grafts: Secondary | ICD-10-CM

## 2020-08-16 MED ORDER — GOSERELIN ACETATE 3.6 MG ~~LOC~~ IMPL
DRUG_IMPLANT | SUBCUTANEOUS | Status: AC
  Filled 2020-08-16: qty 3.6

## 2020-08-16 MED ORDER — GOSERELIN ACETATE 3.6 MG ~~LOC~~ IMPL
3.6000 mg | DRUG_IMPLANT | Freq: Once | SUBCUTANEOUS | Status: AC
  Administered 2020-08-16: 3.6 mg via SUBCUTANEOUS

## 2020-08-16 NOTE — Patient Instructions (Signed)

## 2020-08-20 ENCOUNTER — Telehealth: Payer: Self-pay

## 2020-08-20 NOTE — Telephone Encounter (Signed)
Pt called and states she thinks she would like to decrease Zoladex injections from monthly to Q3 mos as discussed at last visit with Dr Jana Hakim. Pt states she has noticed lately she is more moody as expected with Menopause, and her B/P has been high. This LPN advised pt to contact PCP regarding B/P and I will consult with Dr Jana Hakim about her Zoladex and get back with her. Pt verbalized thanks and understanding.

## 2020-08-21 ENCOUNTER — Other Ambulatory Visit: Payer: Self-pay | Admitting: Oncology

## 2020-08-21 NOTE — Progress Notes (Signed)
Patient requested changing the Zoladex to every 12 weeks.  Entered orders

## 2020-09-11 ENCOUNTER — Telehealth: Payer: Self-pay | Admitting: *Deleted

## 2020-09-11 NOTE — Telephone Encounter (Signed)
This RN was able to speak with pt later in day.  This RN discussed her request regarding cancelling of zoladex inj this Friday.   Note pt is premenopausal on zoladex for ovarian suppression while on aromatase inhibitor.  This RN discussed above issues of holding on the injection - including possible resuming of menses as well as need for contraceptive for unprotected sex.  This RN answered pt's question regarding the above issues.  Of note primary issue of wanting to not get the injection this Friday is her concern with noted elevated BP reading at last visit.  She states she took it at home with some improvement but is not monitoring consistently.  This RN discussed how best to monitor BP in home setting - same time of day approx in same of day, same device and same setting. She should allow her self 1-2 of rest then take.  Discussed possible elevation at last injection could have been related to multiple factors ( including nurse getting ready to give her an injection !)  Post discussion - and answering of questions- Arlethia would like to keep the appointment on Friday as scheduled and receive the 3 month as ordered.  Sura verbalized understanding to call if further questions.

## 2020-09-11 NOTE — Telephone Encounter (Signed)
VM left on RN line from pt stating she would like to cancel appointment 09/13/2020 as well as request to do the every 3 month dose.  Last injection was 08/16/2020.  Note MD has changed order for the q 3 month dose.  This RN called pt and obtained her VM with message stating "mailbox is full and cannot leave message ".  Note pt is on AI and need for continuity of zoladex for ovarian suppression - will attempt to contact pt again.

## 2020-09-12 ENCOUNTER — Telehealth: Payer: Self-pay

## 2020-09-12 ENCOUNTER — Other Ambulatory Visit: Payer: Self-pay | Admitting: Oncology

## 2020-09-12 NOTE — Telephone Encounter (Signed)
TC from Pt stating that she was exposed to Covid and is quarantining. Pt. has appointment tomorrow to receive Zoladex injection. Pt. Was exposed at work on Monday 09/09/20 informed Pt she should get a Covid test at [facility] and injection appointment will be moved to 09/19/20. Pt. Will call with results of Covid test before coming to appointment. High priority message sent to change appointment. Pt verbalized understanding.

## 2020-09-13 ENCOUNTER — Ambulatory Visit: Payer: BC Managed Care – PPO

## 2020-09-23 ENCOUNTER — Inpatient Hospital Stay: Payer: BC Managed Care – PPO | Attending: Oncology

## 2020-09-23 ENCOUNTER — Other Ambulatory Visit: Payer: Self-pay

## 2020-09-23 ENCOUNTER — Other Ambulatory Visit: Payer: Self-pay | Admitting: *Deleted

## 2020-09-23 ENCOUNTER — Ambulatory Visit (HOSPITAL_COMMUNITY)
Admission: RE | Admit: 2020-09-23 | Discharge: 2020-09-23 | Disposition: A | Discharge status: Home / Self Care | Payer: BC Managed Care – PPO | Source: Ambulatory Visit | Attending: Gynecologic Oncology | Admitting: Gynecologic Oncology

## 2020-09-23 ENCOUNTER — Inpatient Hospital Stay: Payer: BC Managed Care – PPO

## 2020-09-23 ENCOUNTER — Encounter: Payer: Self-pay | Admitting: Gynecologic Oncology

## 2020-09-23 VITALS — BP 124/74 | HR 66 | Temp 99.2°F | Resp 18

## 2020-09-23 DIAGNOSIS — Z170 Estrogen receptor positive status [ER+]: Secondary | ICD-10-CM | POA: Diagnosis not present

## 2020-09-23 DIAGNOSIS — G620 Drug-induced polyneuropathy: Secondary | ICD-10-CM

## 2020-09-23 DIAGNOSIS — C50211 Malignant neoplasm of upper-inner quadrant of right female breast: Secondary | ICD-10-CM | POA: Diagnosis present

## 2020-09-23 DIAGNOSIS — Z79818 Long term (current) use of other agents affecting estrogen receptors and estrogen levels: Secondary | ICD-10-CM | POA: Diagnosis not present

## 2020-09-23 DIAGNOSIS — R922 Inconclusive mammogram: Secondary | ICD-10-CM

## 2020-09-23 DIAGNOSIS — Z8041 Family history of malignant neoplasm of ovary: Secondary | ICD-10-CM | POA: Insufficient documentation

## 2020-09-23 DIAGNOSIS — Z95828 Presence of other vascular implants and grafts: Secondary | ICD-10-CM

## 2020-09-23 LAB — CBC WITH DIFFERENTIAL/PLATELET
Abs Immature Granulocytes: 0 10*3/uL (ref 0.00–0.07)
Basophils Absolute: 0 10*3/uL (ref 0.0–0.1)
Basophils Relative: 1 %
Eosinophils Absolute: 0.1 10*3/uL (ref 0.0–0.5)
Eosinophils Relative: 3 %
HCT: 39.5 % (ref 36.0–46.0)
Hemoglobin: 13.6 g/dL (ref 12.0–15.0)
Immature Granulocytes: 0 %
Lymphocytes Relative: 27 %
Lymphs Abs: 1.3 10*3/uL (ref 0.7–4.0)
MCH: 33.7 pg (ref 26.0–34.0)
MCHC: 34.4 g/dL (ref 30.0–36.0)
MCV: 97.8 fL (ref 80.0–100.0)
Monocytes Absolute: 0.4 10*3/uL (ref 0.1–1.0)
Monocytes Relative: 8 %
Neutro Abs: 2.9 10*3/uL (ref 1.7–7.7)
Neutrophils Relative %: 61 %
Platelets: 207 10*3/uL (ref 150–400)
RBC: 4.04 MIL/uL (ref 3.87–5.11)
RDW: 12.8 % (ref 11.5–15.5)
WBC: 4.7 10*3/uL (ref 4.0–10.5)
nRBC: 0 % (ref 0.0–0.2)

## 2020-09-23 LAB — COMPREHENSIVE METABOLIC PANEL
ALT: 29 U/L (ref 0–44)
AST: 21 U/L (ref 15–41)
Albumin: 3.9 g/dL (ref 3.5–5.0)
Alkaline Phosphatase: 84 U/L (ref 38–126)
Anion gap: 9 (ref 5–15)
BUN: 14 mg/dL (ref 6–20)
CO2: 29 mmol/L (ref 22–32)
Calcium: 9.1 mg/dL (ref 8.9–10.3)
Chloride: 103 mmol/L (ref 98–111)
Creatinine, Ser: 0.71 mg/dL (ref 0.44–1.00)
GFR, Estimated: >60 mL/min (ref >60)
Glucose, Bld: 109 mg/dL — ABNORMAL HIGH (ref 70–99)
Potassium: 3.9 mmol/L (ref 3.5–5.1)
Sodium: 141 mmol/L (ref 135–145)
Total Bilirubin: 0.7 mg/dL (ref 0.3–1.2)
Total Protein: 6.8 g/dL (ref 6.5–8.1)

## 2020-09-23 MED ORDER — GOSERELIN ACETATE 10.8 MG ~~LOC~~ IMPL
10.8000 mg | DRUG_IMPLANT | Freq: Once | SUBCUTANEOUS | Status: AC
  Administered 2020-09-23: 13:00:00 10.8 mg via SUBCUTANEOUS
  Filled 2020-09-23: qty 10.8

## 2020-09-23 MED ORDER — GOSERELIN ACETATE 3.6 MG ~~LOC~~ IMPL: 3.6000 mg | DRUG_IMPLANT | Freq: Once | SUBCUTANEOUS | Status: DC

## 2020-09-23 MED ORDER — GOSERELIN ACETATE 3.6 MG ~~LOC~~ IMPL
DRUG_IMPLANT | SUBCUTANEOUS | Status: AC
  Filled 2020-09-23: qty 3.6

## 2020-09-23 NOTE — Progress Notes (Signed)
Gynecologic Oncology Follow-up Note  Chief Complaint:  Chief Complaint  Patient presents with  . Malignant neoplasm of upper-inner quadrant of right breast   . family history of ovarian cancer    Consult was initially requested by Dr. Jana Hakim for the evaluation of Hannah Woodard 49 y.o. female  Assessment/Plan:  Hannah Woodard  is a 49 y.o.  year old with a personal history of triple positive breast cancer at age 9 and family history of ovarian cancer, BRCA negative on genetic testing. We will plan on scheduling transvaginal ultrasound scan and Ca1 25 in June and again in December. And continue 12 monthly evaluations until she decides it is time for her to have a definitive BSO (she would like to hold off until after she has come off ovarian suppressive medication and attempts pregnancy). I will see her annually in December and she will have 6 monthly US's and CA 125 checks.  I discussed the assessment and treatment plan with the patient. The patient was provided with an opportunity to ask questions and all were answered. The patient agreed with the plan and demonstrated an understanding of the instructions.    HPI: Hannah Woodard is a 49 year old P0 who is seen in consultation at the request of Dr Jana Hakim for an increased risk of ovarian cancer.   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  This was treated with surgery radiation and chemotherapy.  She also received trastuzumab, and then goserelin injections for ovarian suppression and anastrozole.  She has remained disease-free since her initial diagnosis.  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  She reports a family history of a mother who had ovarian cancer in her early 45s.  The patient has received genetic testing for BRCA and HRD and this is negative.  Her sibling had her ovaries removed for prophylactic reasons with no malignancy identified.   Her father had a diagnosis of prostate cancer but eventually died from alcoholic cirrhosis.  The patient is nulliparous but desires future pregnancy.  She has sought consultation with [facility] reproductive endocrinology.  They feel that she is not a good candidate for egg donation due to her advanced maternal age, however with donation of either an embryo or an egg donor and insemination she might be able to spontaneously conceived.  She would like to keep her options open regarding future pregnancy.  She has had no history of prior abdominal surgeries.  Other than her history of breast cancer she does not have any other significant medical history.  After consultation, she elected for close monitoring until closer to age 76 or natural menopause.   She underwent a transvaginal ultrasound for screening in June 2020 which revealed a prominent right ovary.  It was repeated on June 02, 2019 and showed bilaterally normal ovaries and uterus.  Ca1 25 was assessed in June 2020 was normal at 13.1.  Ca1 25 on September 01, 2019 was stable and normal at 13.9.  She continued to have menopausal symptoms while on goserelin injections.  She uses [place] and coconut oil and other lubricants for intercourse and use of a vaginal dilator for her genital atrophy symptoms.  She reported her last Pap was in 2019 and was normal and she is due again for repeat Pap in 2022.  Interval Hx:  TVUS was performed on 09/23/20 and showed stable normal appearing ovaries with benign appearing calcifications.  Current Meds:  Outpatient Encounter Medications as of 09/25/2020  Medication Sig  . anastrozole (ARIMIDEX) 1 MG tablet Take 1 tablet (1 mg total) by mouth daily.  . Ascorbic Acid (VITAMIN C PO) Take 400 Units by mouth daily.   Marland Kitchen ascorbic acid (VITAMIN C) 1000 MG tablet Take by mouth.  Marland Kitchen b complex vitamins tablet Take 1 tablet by mouth daily.  . Bioflavonoid Products (QUERCETIN COMPLEX IMMUNE PO) Take by mouth.  W/ bromelain  . Cholecalciferol (VITAMIN D3) 5000 units TABS Take 10,000 Units by mouth daily.  . clobetasol (TEMOVATE) 0.05 % external solution Apply topically 2 (two) times daily as needed.  . desoximetasone (TOPICORT) 0.25 % cream Apply topically daily as needed.  . ferrous sulfate 325 (65 FE) MG tablet Take 325 mg by mouth daily with breakfast.  . fish oil-omega-3 fatty acids 1000 MG capsule Take 1 g by mouth daily.  Marland Kitchen gabapentin (NEURONTIN) 300 MG capsule Take 1 capsule (300 mg total) by mouth at bedtime.  Marland Kitchen glucosamine-chondroitin 500-400 MG tablet Take 3 tablets by mouth daily.  Nyoka Cowden Tea 200 MG CAPS Take 1 capsule by mouth daily in the afternoon.  . Magnesium Citrate POWD Take by mouth daily.  . Melatonin 3 MG CAPS Take 1 capsule by mouth at bedtime.  . ondansetron (ZOFRAN) 4 MG tablet Take 1 tablet (4 mg total) by mouth daily as needed for nausea or vomiting (Not sedating).  . Psyllium 48.57 % POWD Take 10 mLs by mouth once a week.  . senna (SENOKOT) 8.6 MG tablet Take 1 tablet by mouth at bedtime as needed for constipation.  . triamcinolone ointment (KENALOG) 0.1 % Apply 1 application topically 2 (two) times daily.  . TURMERIC PO Take 1 capsule by mouth as directed.   Marland Kitchen UNABLE TO FIND Take 500 mg by mouth daily. [place] Tail mushroom  . UNABLE TO FIND Take 500 mg by mouth daily. [place] Mushroom  . UNABLE TO FIND Take 1 tablet by mouth daily in the afternoon. Lipo-thiamine 67m alpha lipoic acid 7.536msupplement  . [DISCONTINUED] COLLAGEN PO   . [DISCONTINUED] MAGNESIUM PO   . acetic acid-hydrocortisone (VOSOL-HC) OTIC solution Place 3 drops into the right ear 2 (two) times daily. (Patient not taking: No sig reported)  . COLLAGEN PO Take by mouth daily. Takes beef collagen (Patient not taking: Reported on 09/23/2020)  . ketoconazole (NIZORAL) 2 % cream APPLY TO AFFECTED AREA EVERY DAY (Patient not taking: No sig reported)  . nystatin ointment (MYCOSTATIN) Apply 1 application  topically 2 (two) times daily. (Patient not taking: Reported on 09/23/2020)  . Zinc 50 MG TABS  (Patient not taking: Reported on 09/23/2020)  . [DISCONTINUED] Magnesium Oxide (MAGNESIUM OXIDE 400) 240 MG PACK Take by mouth.  . [DISCONTINUED] metroNIDAZOLE (METROGEL) 0.75 % vaginal gel Place 1 Applicatorful vaginally at bedtime. 1 applicator at night for 5 days  . [DISCONTINUED] OVER THE COUNTER MEDICATION Take 1 tablet by mouth at bedtime as needed (constipation).   . [DISCONTINUED] prochlorperazine (COMPAZINE) 10 MG tablet Take 1 tablet (10 mg total) by mouth every 6 (six) hours as needed (Nausea or vomiting).  . [DISCONTINUED] Zinc 30 MG TABS Take by mouth daily.  . [DISCONTINUED] goserelin (ZOLADEX) injection 3.6 mg    No facility-administered encounter medications on file as of 09/25/2020.    Allergy:  Allergies  Allergen Reactions  . Sulfamethoxazole-Trimethoprim Rash  . Adhesive [Tape]   . Milk-Related Compounds Diarrhea    Stomach cramping  . Other Rash and  Other (See Comments)    Frangrance  . Poison Ivy Extract [Poison Ivy Extract] Hives, Itching and Rash    Social Hx:   Social History   Socioeconomic History  . Marital status: Married    Spouse name: Not on file  . Number of children: Not on file  . Years of education: Not on file  . Highest education level: Not on file  Occupational History  . Not on file  Tobacco Use  . Smoking status: Never Smoker  . Smokeless tobacco: Never Used  Vaping Use  . Vaping Use: Never used  Substance and Sexual Activity  . Alcohol use: Yes  . Drug use: No  . Sexual activity: Not on file  Other Topics Concern  . Not on file  Social History Narrative  . Not on file   Social Determinants of Health   Financial Resource Strain: Not on file  Food Insecurity: Not on file  Transportation Needs: Not on file  Physical Activity: Not on file  Stress: Not on file  Social Connections: Not on file  Intimate Partner Violence: Not on  file    Past Surgical Hx:  Past Surgical History:  Procedure Laterality Date  . right lumpectomy, sentinel node biopsy, and bilateral mammoplasty  10/2017   [facility]    Past Medical Hx:  Past Medical History:  Diagnosis Date  . Breast cancer ([place])   . Family history of ovarian cancer 09/02/2017  . Family history of prostate cancer in father 09/02/2017  . Family history of uterine cancer 09/02/2017  . Plantar fasciitis     Past Gynecological History:  Amenorrheic on goserelin.G0  Patient's last menstrual period was 10/05/2017.  Family Hx:  Family History  Problem Relation Age of Onset  . Ovarian cancer Mother 71  . Prostate cancer Father 9       'high gleason' unsure number  . Ulcerative colitis Sister   . Other Sister        abn ovaian growth, partial hysterectomy  . Uterine cancer Maternal Aunt 91  . Stroke Maternal Aunt 66  . Basal cell carcinoma Brother   . Skin cancer Paternal Uncle   . Skin cancer Maternal Grandmother   . Stroke Paternal Grandfather 19  . Hydrocephalus Cousin     Review of Systems:  Constitutional  Feels well,    ENT Normal appearing ears and nares bilaterally Skin/Breast  No rash, sores, jaundice, itching, dryness Cardiovascular  No chest pain, shortness of breath, or edema  Pulmonary  No cough or wheeze.  Gastro Intestinal  No nausea, vomitting, or diarrhoea. No bright red blood per rectum, no abdominal pain, change in bowel movement, or constipation.  Genito Urinary  No frequency, urgency, dysuria, no bleeding Musculo Skeletal  No myalgia, arthralgia, joint swelling or pain  Neurologic  No weakness, numbness, change in gait,  Psychology  No depression, anxiety, insomnia.   Vitals:   not available  Physical Exam: WD in NAD Neck  Supple NROM, without any enlargements.  Lymph Node Survey No cervical supraclavicular or inguinal adenopathy Cardiovascular  Pulse normal rate, regularity and rhythm. S1 and S2 normal.  Lungs  Clear  to auscultation bilateraly, without wheezes/crackles/rhonchi. Good air movement.  Skin  No rash/lesions/breakdown  Psychiatry  Alert and oriented to person, place, and time  Abdomen  Normoactive bowel sounds, abdomen soft, non-tender and nonobese without evidence of hernia.  Back No CVA tenderness Genito Urinary  Vulva/vagina: Normal external female genitalia.  No lesions. No discharge or  bleeding.  Bladder/urethra:  No lesions or masses, well supported bladder  Vagina: normal  Cervix: Normal appearing, no lesions.  Uterus:  Small, mobile, no parametrial involvement or nodularity.  Adnexa: no palpable masses. Rectal  deferred Extremities  No bilateral cyanosis, clubbing or edema.   Thereasa Solo, MD  09/25/2020, 2:02 PM

## 2020-09-23 NOTE — Patient Instructions (Signed)

## 2020-09-25 ENCOUNTER — Encounter: Payer: Self-pay | Admitting: Gynecologic Oncology

## 2020-09-25 ENCOUNTER — Inpatient Hospital Stay (HOSPITAL_BASED_OUTPATIENT_CLINIC_OR_DEPARTMENT_OTHER): Payer: BC Managed Care – PPO | Admitting: Gynecologic Oncology

## 2020-09-25 ENCOUNTER — Inpatient Hospital Stay: Payer: BC Managed Care – PPO

## 2020-09-25 ENCOUNTER — Other Ambulatory Visit: Payer: Self-pay

## 2020-09-25 VITALS — BP 125/88 | HR 78 | Temp 97.8°F | Resp 18 | Ht 66.0 in | Wt 162.4 lb

## 2020-09-25 DIAGNOSIS — Z8041 Family history of malignant neoplasm of ovary: Secondary | ICD-10-CM

## 2020-09-25 DIAGNOSIS — C50211 Malignant neoplasm of upper-inner quadrant of right female breast: Secondary | ICD-10-CM

## 2020-09-25 DIAGNOSIS — Z170 Estrogen receptor positive status [ER+]: Secondary | ICD-10-CM | POA: Diagnosis not present

## 2020-09-25 NOTE — Patient Instructions (Signed)
Dr. Denman George will see back in 6 months time after repeat ultrasound and Ca1 25.

## 2020-09-26 LAB — CA 125: Cancer Antigen (CA) 125: 14.8 U/mL (ref 0.0–38.1)

## 2020-10-11 ENCOUNTER — Ambulatory Visit: Payer: BC Managed Care – PPO

## 2020-11-08 ENCOUNTER — Ambulatory Visit: Payer: BC Managed Care – PPO

## 2020-11-29 ENCOUNTER — Telehealth: Payer: Self-pay | Admitting: Oncology

## 2020-11-29 NOTE — Telephone Encounter (Signed)
Rescheduled appointment per 03/03 schedule message. Contacted patient, patient is aware.

## 2020-12-06 ENCOUNTER — Ambulatory Visit: Payer: BC Managed Care – PPO

## 2020-12-16 ENCOUNTER — Inpatient Hospital Stay: Payer: BC Managed Care – PPO | Attending: Oncology

## 2020-12-16 ENCOUNTER — Other Ambulatory Visit: Payer: Self-pay

## 2020-12-16 VITALS — BP 122/82 | HR 74 | Temp 97.8°F | Resp 18

## 2020-12-16 DIAGNOSIS — Z5111 Encounter for antineoplastic chemotherapy: Secondary | ICD-10-CM | POA: Insufficient documentation

## 2020-12-16 DIAGNOSIS — Z79811 Long term (current) use of aromatase inhibitors: Secondary | ICD-10-CM | POA: Diagnosis not present

## 2020-12-16 DIAGNOSIS — C50211 Malignant neoplasm of upper-inner quadrant of right female breast: Secondary | ICD-10-CM | POA: Diagnosis not present

## 2020-12-16 DIAGNOSIS — Z170 Estrogen receptor positive status [ER+]: Secondary | ICD-10-CM | POA: Diagnosis not present

## 2020-12-16 DIAGNOSIS — Z95828 Presence of other vascular implants and grafts: Secondary | ICD-10-CM

## 2020-12-16 MED ORDER — GOSERELIN ACETATE 10.8 MG ~~LOC~~ IMPL
10.8000 mg | DRUG_IMPLANT | Freq: Once | SUBCUTANEOUS | Status: AC
  Administered 2020-12-16: 10.8 mg via SUBCUTANEOUS
  Filled 2020-12-16: qty 10.8

## 2020-12-26 ENCOUNTER — Encounter: Payer: BC Managed Care – PPO | Admitting: Adult Health Nurse Practitioner

## 2020-12-30 DIAGNOSIS — E785 Hyperlipidemia, unspecified: Secondary | ICD-10-CM | POA: Insufficient documentation

## 2020-12-30 NOTE — Progress Notes (Addendum)
Complete Physical  Assessment and Plan:  Encounter for Routine Physical Exam without abnormal findings Due annually   Medication management -     CBC with Differential/Platelet -     COMPLETE METABOLIC PANEL WITH GFR -     Magnesium  Malignant neoplasm of upper-inner quadrant of right breast in female, estrogen receptor positive ([place]) Continue follow up with oncology  Neuropathy due to chemotherapeutic drug ([place]) monitor  Vitamin D deficiency -     VITAMIN D 25 Hydroxy (Vit-D Deficiency, Fractures)  Dense breast tissue Continue follow up oncology  Hyperlipidemia Mild elevations treated by lifestyle at this time  Continue low cholesterol diet and exercise.  Check lipid panel.  -     Lipid panel -     TSH  Screening for diabetes mellitus -     Hemoglobin A1c  Screening for hematuria or proteinuria -     Urinalysis, Routine w reflex microscopic  Screening, anemia, deficiency, iron -     Iron,Total/Total Iron Binding Cap -     Vitamin B12  Vaginal atrophy Vitamin E supp, albolene  Need for tetanus booster - Td administered without complication  Colon cancer screening - patient declines a colonoscopy even though the risks and benefits were discussed at length. Colon cancer is 3rd most diagnosed cancer and 2nd leading cause of death in both men and women 75 years of age and older. Patient understands the risk of cancer and death with declining the test however they are willing to do cologuard screening instead. They understand that this is not as sensitive or specific as a colonoscopy and they are still recommended to get a colonoscopy. The cologuard will be sent out to their house.    Orders Placed This Encounter  Procedures  . Td : Tetanus/diphtheria >7yo Preservative  free  . CBC with Differential/Platelet  . COMPLETE METABOLIC PANEL WITH GFR  . Lipid panel  . TSH  . Hemoglobin A1c  . VITAMIN D 25 Hydroxy (Vit-D Deficiency, Fractures)  . Urinalysis, Routine w  reflex microscopic  . Vitamin B12  . Iron, TIBC and Ferritin Panel  . Cologuard     Discussed med's effects and SE's. Screening labs and tests as requested with regular follow-up as recommended.  Future Appointments  Date Time Provider [place]  03/10/2021  3:30 PM [facility] [place] FL[facility] None  03/13/2021 11:00 AM [facility]-US 1 [facility]US [place]  03/13/2021 12:30 PM [facility] LAB [facility] None  03/18/2021  1:45 PM Everitt Amber, MD [facility] None  03/28/2021  3:00 PM [facility] [place] [facility] None  04/23/2021  2:30 PM [facility] LAB [facility] None  04/23/2021  3:00 PM Magrinat, Virgie Dad, MD [facility] None  12/31/2021  2:00 PM Liane Comber, NP [facility] None     HPI 50 y.o. female  presents for a complete physical. She has Vitamin D deficiency; Medication management; Dense breast tissue; Malignant neoplasm of upper-inner quadrant of right breast in female, estrogen receptor positive ([place]); Genetic testing; Port-A-Cath in place; Neuropathy due to chemotherapeutic drug ([place]); Hyperlipidemia; and Overweight (BMI 25.0-29.9) on their problem list.  She is married, no children. 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   Had invasive right breast cancer diagnosis Dec 2018, grade 2 triple positive, finished Taxol and Herceptin Feb 2020-following with Dr. Griffith Citron. She is on arimedex, and she is on zoledex time release q66mfor chemical menopause.   Due personal breast cancer hx at age 50 family history of ovarian cancer,  had genetic testing which showed BRCA negative, but was referred to Dr. Everitt Amber, planning to follow with pelvic US and Ca 126 q68m(planning BSO after she reaches age), last was 08/2020 and unchanged.   BMI is Body mass index is 27.09 kg/m., she is working on diet and exercise, trying to walk more regularly, had stopped when her neighbor/friend moved away, now walking with sister and elderly dog.   Wt  Readings from Last 3 Encounters:  12/31/20 166 lb 9.6 oz (75.6 kg)  09/25/20 162 lb 6.4 oz (73.7 kg)  04/24/20 158 lb 1.6 oz (71.7 kg)    Her blood pressure has been controlled at home, today their BP is BP: 118/76  Last cholesterol was mildly elevated, working on diet/exericse. Last lipids were:  Lab Results  Component Value Date   CHOL 204 (H) 12/27/2019   HDL 62 12/27/2019   LDLCALC 126 (H) 12/27/2019   TRIG 70 12/27/2019   CHOLHDL 3.3 12/27/2019   Last A1C was normal Lab Results  Component Value Date   HGBA1C 4.8 12/27/2019   Patient is on Vitamin D supplement.   Lab Results  Component Value Date   VD25OH 67 12/27/2019     She is on B12 supplement Lab Results  Component Value Date   VITAMINB12 1,337 (H) 12/27/2018      Current Medications:       Current Outpatient Medications (Hematological):  .  ferrous sulfate 325 (65 FE) MG tablet, Take 325 mg by mouth daily with breakfast.  Current Outpatient Medications (Other):  .  anastrozole (ARIMIDEX) 1 MG tablet, Take 1 tablet (1 mg total) by mouth daily. .Marland Kitchen ascorbic acid (VITAMIN C) 1000 MG tablet, Take by mouth. .Marland Kitchen b complex vitamins tablet, Take 1 tablet by mouth daily. .  Cholecalciferol (VITAMIN D3) 5000 units TABS, Take 10,000 Units by mouth daily. .  clobetasol (TEMOVATE) 0.05 % external solution, Apply topically 2 (two) times daily as needed. .  COLLAGEN PO, Take by mouth daily. Takes beef collagen .  desoximetasone (TOPICORT) 0.25 % cream, Apply topically daily as needed. .  fish oil-omega-3 fatty acids 1000 MG capsule, Take 1 g by mouth daily. .Marland Kitchen gabapentin (NEURONTIN) 300 MG capsule, Take 1 capsule (300 mg total) by mouth at bedtime. .Marland Kitchen glucosamine-chondroitin 500-400 MG tablet, Take 3 tablets by mouth daily. .Nyoka CowdenTea 200 MG CAPS, Take 1 capsule by mouth daily in the afternoon. .  Magnesium Citrate POWD, Take by mouth daily. .  Menaquinone-7 (VITAMIN K2 PO), Take 150 mcg by mouth daily. .   ondansetron (ZOFRAN) 4 MG tablet, Take 1 tablet (4 mg total) by mouth daily as needed for nausea or vomiting (Not sedating). .Marland Kitchen senna (SENOKOT) 8.6 MG tablet, Take 1 tablet by mouth at bedtime as needed for constipation. .  triamcinolone ointment (KENALOG) 0.1 %, Apply 1 application topically 2 (two) times daily. .  TURMERIC PO, Take 1 capsule by mouth as directed.  .Marland Kitchen UNABLE TO FIND, Take 500 mg by mouth daily. [place] mushroom .  UNABLE TO FIND, Take 500 mg by mouth daily. [place] Mushroom .  UNABLE TO FIND, Take 1 tablet by mouth daily in the afternoon. Lipo-thiamine 53malpha lipoic acid 7.37m38mupplement .  Zinc 50 MG TABS,  .  acetic acid-hydrocortisone (VOSOL-HC) OTIC solution, Place 3 drops into the right ear 2 (two) times daily. (Patient not taking: No sig reported) .  Bioflavonoid Products (QUERCETIN COMPLEX IMMUNE PO), Take by  mouth. W/ bromelain (Patient not taking: Reported on 12/31/2020) .  ketoconazole (NIZORAL) 2 % cream, APPLY TO AFFECTED AREA EVERY DAY (Patient not taking: No sig reported) .  Melatonin 3 MG CAPS, Take 1 capsule by mouth at bedtime. (Patient not taking: Reported on 12/31/2020) .  nystatin ointment (MYCOSTATIN), Apply 1 application topically 2 (two) times daily. (Patient not taking: No sig reported) .  Psyllium 48.57 % POWD, Take 10 mLs by mouth once a week. (Patient not taking: Reported on 12/31/2020)  Health Maintenance:  Immunization History  Administered Date(s) Administered  . Hepatitis A 10/02/1997  . Hepatitis B 12/04/1997  . Td 10/10/2008, 12/31/2020   Tetanus: 2010- due TODAY, declines Tdap, TD today  Pneumovax: N/A Flu vaccine: N/A Zostavax: N/A  Covid 19: declines   Pap: 11/2018 never normal pap, negative HPV - plans to schedule with GYN [facility]: [facility], see in care everywhere, getting annually  s/p lumpectomy and chemo in 2017, had breast reduction.  DEXA: 08/2018 on arimedix, T -0.7 US Pelvis 04/2020  Colonoscopy: DUE - declined  colonoscopy  EGD: N/A  Echo 09/2018 Ct 04/2017  Last eye: [facility] Dr. Andria Frames, last 2021, goes annually, presbyopia Last Dental: Last 2019, encouraged to schedule, looking at Dr. [facility]  Last derm: Dr. ?, last 2021, goes annally   Medical History:  Past Medical History:  Diagnosis Date  . Breast cancer ([place])   . Family history of ovarian cancer 09/02/2017  . Family history of prostate cancer in father 09/02/2017  . Family history of uterine cancer 09/02/2017  . Plantar fasciitis     Allergies Allergies  Allergen Reactions  . Sulfamethoxazole-Trimethoprim Rash  . Adhesive [Tape]   . Milk-Related Compounds Diarrhea    Stomach cramping  . Other Rash and Other (See Comments)    Frangrance  . Poison Ivy Extract [Poison Ivy Extract] Hives, Itching and Rash    SURGICAL HISTORY She  has a past surgical history that includes right lumpectomy, sentinel node biopsy, and bilateral mammoplasty (10/2017) and Port-a-cath removal (Left, 2020). FAMILY HISTORY Her family history includes Basal cell carcinoma in her brother; Basal cell carcinoma (age of onset: 52) in her sister; Hydrocephalus in her cousin; Other in her sister; Ovarian cancer (age of onset: 76) in her mother; Prostate cancer (age of onset: 72) in her father; Skin cancer in her maternal grandmother and paternal uncle; Stroke (age of onset: 12) in her maternal aunt and paternal grandfather; Ulcerative colitis in her sister; Uterine cancer (age of onset: 23) in her maternal aunt. SOCIAL HISTORY She  reports that she has never smoked. She has never used smokeless tobacco. She reports previous alcohol use. She reports that she does not use drugs.    Review of Systems  Constitutional: Negative.  Negative for malaise/fatigue and weight loss.  HENT: Negative.  Negative for hearing loss and tinnitus.   Eyes: Negative.  Negative for blurred vision and double vision.  Respiratory: Negative.  Negative for cough, shortness of breath and wheezing.    Cardiovascular: Negative.  Negative for chest pain, palpitations, orthopnea, claudication and leg swelling.  Gastrointestinal: Negative.  Negative for abdominal pain, blood in stool, constipation, diarrhea, heartburn, melena, nausea and vomiting.  Genitourinary: Negative.  Negative for dysuria, flank pain, frequency, hematuria and urgency.  Musculoskeletal: Negative for back pain, falls, joint pain, myalgias and neck pain.  Skin: Negative.  Negative for rash.  Neurological: Negative.  Negative for dizziness, tingling, sensory change, weakness and headaches.  Endo/Heme/Allergies: Negative.  Negative for  polydipsia.  Psychiatric/Behavioral: Negative.   All other systems reviewed and are negative.    Physical Exam: Estimated body mass index is 27.09 kg/m as calculated from the following:   Height as of this encounter: 5' 5.75" (1.67 m).   Weight as of this encounter: 166 lb 9.6 oz (75.6 kg). Vitals:   12/31/20 1411  BP: 118/76  Pulse: 78  Temp: (!) 97.3 F (36.3 C)  SpO2: 98%   General Appearance: Well nourished, in no apparent distress. Eyes: PERRLA, EOMs, conjunctiva no swelling or erythema, normal fundi and vessels. Sinuses: No Frontal/maxillary tenderness ENT/Mouth: Ext aud canals clear, normal light reflex with TMs without erythema, bulging.  Good dentition. No erythema, swelling, or exudate on post pharynx. Tonsils not swollen or erythematous. Hearing normal.  Neck: Supple, thyroid normal. No bruits Respiratory: Respiratory effort normal, BS equal bilaterally without rales, rhonchi, wheezing or stridor. Cardio: RRR without murmurs, rubs or gallops. Brisk peripheral pulses without edema.  Chest: symmetric, with normal excursions and percussion. Breasts: defer to oncology Abdomen: Soft, +BS. Non tender, no guarding, rebound, hernias, masses, or organomegaly. Lymphatics: Non tender without lymphadenopathy.  Genitourinary: declines, planning to schedule GYN Musculoskeletal:  Full ROM all peripheral extremities,5/5 strength, and normal gait. Skin: Warm, dry without rashes, lesions, ecchymosis.  Neuro: Cranial nerves intact, reflexes equal bilaterally. Normal muscle tone, no cerebellar symptoms. Sensation intact.  Psych: Awake and oriented X 3, normal affect, Insight and Judgment appropriate.   EKG: NSR, IRBBB, NSCPT  Gorden Harms Marvin Grabill 5:20 PM

## 2020-12-31 ENCOUNTER — Ambulatory Visit (INDEPENDENT_AMBULATORY_CARE_PROVIDER_SITE_OTHER): Payer: BC Managed Care – PPO | Admitting: Adult Health

## 2020-12-31 ENCOUNTER — Other Ambulatory Visit: Payer: Self-pay

## 2020-12-31 ENCOUNTER — Encounter: Payer: Self-pay | Admitting: Adult Health

## 2020-12-31 VITALS — BP 118/76 | HR 78 | Temp 97.3°F | Ht 65.75 in | Wt 166.6 lb

## 2020-12-31 DIAGNOSIS — E785 Hyperlipidemia, unspecified: Secondary | ICD-10-CM

## 2020-12-31 DIAGNOSIS — Z79899 Other long term (current) drug therapy: Secondary | ICD-10-CM

## 2020-12-31 DIAGNOSIS — I4510 Unspecified right bundle-branch block: Secondary | ICD-10-CM | POA: Diagnosis not present

## 2020-12-31 DIAGNOSIS — Z131 Encounter for screening for diabetes mellitus: Secondary | ICD-10-CM

## 2020-12-31 DIAGNOSIS — Z1211 Encounter for screening for malignant neoplasm of colon: Secondary | ICD-10-CM

## 2020-12-31 DIAGNOSIS — Z136 Encounter for screening for cardiovascular disorders: Secondary | ICD-10-CM | POA: Diagnosis not present

## 2020-12-31 DIAGNOSIS — Z13220 Encounter for screening for lipoid disorders: Secondary | ICD-10-CM | POA: Diagnosis not present

## 2020-12-31 DIAGNOSIS — Z23 Encounter for immunization: Secondary | ICD-10-CM

## 2020-12-31 DIAGNOSIS — G620 Drug-induced polyneuropathy: Secondary | ICD-10-CM

## 2020-12-31 DIAGNOSIS — Z170 Estrogen receptor positive status [ER+]: Secondary | ICD-10-CM

## 2020-12-31 DIAGNOSIS — Z0000 Encounter for general adult medical examination without abnormal findings: Secondary | ICD-10-CM

## 2020-12-31 DIAGNOSIS — C50211 Malignant neoplasm of upper-inner quadrant of right female breast: Secondary | ICD-10-CM

## 2020-12-31 DIAGNOSIS — Z95828 Presence of other vascular implants and grafts: Secondary | ICD-10-CM

## 2020-12-31 DIAGNOSIS — D649 Anemia, unspecified: Secondary | ICD-10-CM

## 2020-12-31 DIAGNOSIS — Z1389 Encounter for screening for other disorder: Secondary | ICD-10-CM | POA: Diagnosis not present

## 2020-12-31 DIAGNOSIS — T451X5A Adverse effect of antineoplastic and immunosuppressive drugs, initial encounter: Secondary | ICD-10-CM

## 2020-12-31 DIAGNOSIS — E559 Vitamin D deficiency, unspecified: Secondary | ICD-10-CM

## 2020-12-31 DIAGNOSIS — Z1329 Encounter for screening for other suspected endocrine disorder: Secondary | ICD-10-CM

## 2020-12-31 DIAGNOSIS — Z130 Encounter for screening for diseases of the blood and blood-forming organs and certain disorders involving the immune mechanism: Secondary | ICD-10-CM | POA: Diagnosis not present

## 2020-12-31 DIAGNOSIS — E663 Overweight: Secondary | ICD-10-CM

## 2020-12-31 NOTE — Patient Instructions (Addendum)
   Ms. Glendenning , Thank you for taking time to come for your Annual Wellness Visit. I appreciate your ongoing commitment to your health goals. Please review the following plan we discussed and let me know if I can assist you in the future.    This is a list of the screening recommended for you and due dates:  Health Maintenance  Topic Date Due  . Tetanus Vaccine  10/10/2018  . COVID-19 Vaccine (1) 01/16/2021*  . Flu Shot  04/28/2021  . Mammogram  05/15/2021  . Pap Smear  12/26/2021  . HPV Vaccine  Aged Out  .  Hepatitis C: One time screening is recommended by Center for Disease Control  (CDC) for  adults born from [birth year] through 1963.   Discontinued  . HIV Screening  Discontinued  *Topic was postponed. The date shown is not the original due date.     Know what a healthy weight is for you (roughly BMI <25) and aim to maintain this  Aim for 7+ servings of fruits and vegetables daily  65-80+ fluid ounces of water or unsweet tea for healthy kidneys  Limit to max 1 drink of alcohol per day; avoid smoking/tobacco  Limit animal fats in diet for cholesterol and heart health - choose grass fed whenever available  Avoid highly processed foods, and foods high in saturated/trans fats  Aim for low stress - take time to unwind and care for your mental health  Aim for 150 min of moderate intensity exercise weekly for heart health, and weights twice weekly for bone health  Aim for 7-9 hours of sleep daily   COLOGUARD INFORMATION   Cologuard is an easy to use noninvasive colon cancer screening test based on the latest advances in stool DNA science.   Colon cancer is 3rd most diagnosed cancer and 2nd leading cause of death in both men and women 32 years of age and older despite being one of the most preventable and treatable cancers if found early.  4 of out 5 people diagnosed with colon cancer have NO prior family history.  When caught EARLY 90% of colon cancer is curable.   More than 92%  of cologuard patients have NO out of pocket cost for screening however only your insurer can confirm how Cologuard would be covered for you. [employer] has a team of specialist that can help you contact your insurer and ask the right questions. Please call [phone] so they can help.   You will receive a short call from [facility] support center at [employer], when you receive a call they will say they are from [place],  to confirm your mailing address and give you more information.  When they calll you, it will appear on the caller ID as "[employer]" or in some cases only this number will appear, [phone].   [employer] will ship your collection kit directly to you. You will collect a single stool sample in the privacy of your own home, no special preparation required. You will return the kit via [place] pre-paid shipping or pick-up, in the same box it arrived in. Then I will contact you to discuss your results after I receive them from the laboratory.   If you have any questions or concerns, [facility] Customer Support Specialist are available 24 hours a day, 7 days a week at [phone] or go to [email or web address].

## 2020-12-31 NOTE — Addendum Note (Signed)
Addended by: Izora Ribas on: 12/31/2020 05:21 PM   Modules accepted: Orders

## 2021-01-01 ENCOUNTER — Other Ambulatory Visit: Payer: Self-pay | Admitting: Adult Health

## 2021-01-01 LAB — HEMOGLOBIN A1C
Hgb A1c MFr Bld: 4.8 % of total Hgb (ref <5.7)
Mean Plasma Glucose: 91 mg/dL
eAG (mmol/L): 5 mmol/L

## 2021-01-01 LAB — CBC WITH DIFFERENTIAL/PLATELET
Absolute Monocytes: 361 cells/uL (ref 200–950)
Basophils Absolute: 19 cells/uL (ref 0–200)
Basophils Relative: 0.5 %
Eosinophils Absolute: 110 cells/uL (ref 15–500)
Eosinophils Relative: 2.9 %
HCT: 38.3 % (ref 35.0–45.0)
Hemoglobin: 13.1 g/dL (ref 11.7–15.5)
Lymphs Abs: 1144 cells/uL (ref 850–3900)
MCH: 34.3 pg — ABNORMAL HIGH (ref 27.0–33.0)
MCHC: 34.2 g/dL (ref 32.0–36.0)
MCV: 100.3 fL — ABNORMAL HIGH (ref 80.0–100.0)
MPV: 9.5 fL (ref 7.5–12.5)
Monocytes Relative: 9.5 %
Neutro Abs: 2166 cells/uL (ref 1500–7800)
Neutrophils Relative %: 57 %
Platelets: 206 10*3/uL (ref 140–400)
RBC: 3.82 10*6/uL (ref 3.80–5.10)
RDW: 12.3 % (ref 11.0–15.0)
Total Lymphocyte: 30.1 %
WBC: 3.8 10*3/uL (ref 3.8–10.8)

## 2021-01-01 LAB — VITAMIN D 25 HYDROXY (VIT D DEFICIENCY, FRACTURES): Vit D, 25-Hydroxy: 100 ng/mL (ref 30–100)

## 2021-01-01 LAB — COMPLETE METABOLIC PANEL WITH GFR
AG Ratio: 2.1 (calc) (ref 1.0–2.5)
ALT: 18 U/L (ref 6–29)
AST: 19 U/L (ref 10–35)
Albumin: 4.5 g/dL (ref 3.6–5.1)
Alkaline phosphatase (APISO): 78 U/L (ref 31–125)
BUN: 14 mg/dL (ref 7–25)
CO2: 31 mmol/L (ref 20–32)
Calcium: 9.4 mg/dL (ref 8.6–10.2)
Chloride: 105 mmol/L (ref 98–110)
Creat: 0.53 mg/dL (ref 0.50–1.10)
GFR, Est African American: 131 mL/min/{1.73_m2} (ref >=60)
GFR, Est Non African American: 113 mL/min/{1.73_m2} (ref >=60)
Globulin: 2.1 g/dL (calc) (ref 1.9–3.7)
Glucose, Bld: 96 mg/dL (ref 65–99)
Potassium: 4.1 mmol/L (ref 3.5–5.3)
Sodium: 143 mmol/L (ref 135–146)
Total Bilirubin: 0.7 mg/dL (ref 0.2–1.2)
Total Protein: 6.6 g/dL (ref 6.1–8.1)

## 2021-01-01 LAB — URINALYSIS, ROUTINE W REFLEX MICROSCOPIC
Bacteria, UA: NONE SEEN /HPF
Bilirubin Urine: NEGATIVE
Glucose, UA: NEGATIVE
Hgb urine dipstick: NEGATIVE
Hyaline Cast: NONE SEEN /LPF
Ketones, ur: NEGATIVE
Nitrite: NEGATIVE
Protein, ur: NEGATIVE
RBC / HPF: NONE SEEN /HPF (ref 0–2)
Specific Gravity, Urine: 1.006 (ref 1.001–1.03)
Squamous Epithelial / HPF: NONE SEEN /HPF (ref <=5)
pH: 8 (ref 5.0–8.0)

## 2021-01-01 LAB — LIPID PANEL
Cholesterol: 171 mg/dL (ref <200)
HDL: 51 mg/dL (ref >=50)
LDL Cholesterol (Calc): 106 mg/dL (calc) — ABNORMAL HIGH
Non-HDL Cholesterol (Calc): 120 mg/dL (calc) (ref <130)
Total CHOL/HDL Ratio: 3.4 (calc) (ref <5.0)
Triglycerides: 47 mg/dL (ref <150)

## 2021-01-01 LAB — VITAMIN B12: Vitamin B-12: 868 pg/mL (ref 200–1100)

## 2021-01-01 LAB — IRON,TIBC AND FERRITIN PANEL
%SAT: 32 % (calc) (ref 16–45)
Ferritin: 262 ng/mL — ABNORMAL HIGH (ref 16–232)
Iron: 87 ug/dL (ref 40–190)
TIBC: 276 mcg/dL (calc) (ref 250–450)

## 2021-01-01 LAB — MICROSCOPIC MESSAGE

## 2021-01-01 LAB — TSH: TSH: 3.17 mIU/L

## 2021-01-03 ENCOUNTER — Ambulatory Visit: Payer: BC Managed Care – PPO

## 2021-01-08 NOTE — Addendum Note (Signed)
Addended by: Izora Ribas on: 01/08/2021 01:38 PM   Modules accepted: Orders

## 2021-01-16 LAB — COLOGUARD: Cologuard: NEGATIVE

## 2021-01-20 LAB — COLOGUARD: COLOGUARD: NEGATIVE

## 2021-01-21 ENCOUNTER — Encounter: Payer: Self-pay | Admitting: Internal Medicine

## 2021-01-31 ENCOUNTER — Ambulatory Visit: Payer: BC Managed Care – PPO

## 2021-02-19 ENCOUNTER — Encounter: Payer: Self-pay | Admitting: Oncology

## 2021-02-28 ENCOUNTER — Ambulatory Visit: Payer: BC Managed Care – PPO

## 2021-03-10 ENCOUNTER — Inpatient Hospital Stay: Payer: BC Managed Care – PPO | Attending: Oncology

## 2021-03-10 ENCOUNTER — Other Ambulatory Visit: Payer: Self-pay

## 2021-03-10 VITALS — BP 138/78 | HR 81 | Temp 98.2°F | Resp 18

## 2021-03-10 DIAGNOSIS — Z8041 Family history of malignant neoplasm of ovary: Secondary | ICD-10-CM | POA: Insufficient documentation

## 2021-03-10 DIAGNOSIS — Z8049 Family history of malignant neoplasm of other genital organs: Secondary | ICD-10-CM | POA: Insufficient documentation

## 2021-03-10 DIAGNOSIS — Z8042 Family history of malignant neoplasm of prostate: Secondary | ICD-10-CM | POA: Diagnosis not present

## 2021-03-10 DIAGNOSIS — Z79899 Other long term (current) drug therapy: Secondary | ICD-10-CM | POA: Insufficient documentation

## 2021-03-10 DIAGNOSIS — C50911 Malignant neoplasm of unspecified site of right female breast: Secondary | ICD-10-CM | POA: Insufficient documentation

## 2021-03-10 DIAGNOSIS — Z170 Estrogen receptor positive status [ER+]: Secondary | ICD-10-CM

## 2021-03-10 DIAGNOSIS — Z79811 Long term (current) use of aromatase inhibitors: Secondary | ICD-10-CM | POA: Insufficient documentation

## 2021-03-10 DIAGNOSIS — Z95828 Presence of other vascular implants and grafts: Secondary | ICD-10-CM

## 2021-03-10 MED ORDER — GOSERELIN ACETATE 10.8 MG ~~LOC~~ IMPL
10.8000 mg | DRUG_IMPLANT | Freq: Once | SUBCUTANEOUS | Status: AC
  Administered 2021-03-10: 16:00:00 10.8 mg via SUBCUTANEOUS
  Filled 2021-03-10: qty 10.8

## 2021-03-11 ENCOUNTER — Other Ambulatory Visit: Payer: Self-pay | Admitting: Oncology

## 2021-03-13 ENCOUNTER — Other Ambulatory Visit: Payer: BC Managed Care – PPO

## 2021-03-13 ENCOUNTER — Ambulatory Visit (HOSPITAL_COMMUNITY): Payer: BC Managed Care – PPO

## 2021-03-13 ENCOUNTER — Telehealth: Payer: Self-pay | Admitting: Oncology

## 2021-03-13 NOTE — Telephone Encounter (Signed)
Rescheduled upcoming appointment per 6/15 staff message. Patient is aware of changes.

## 2021-03-14 ENCOUNTER — Other Ambulatory Visit: Payer: Self-pay

## 2021-03-14 ENCOUNTER — Inpatient Hospital Stay: Payer: BC Managed Care – PPO

## 2021-03-14 ENCOUNTER — Ambulatory Visit (HOSPITAL_COMMUNITY)
Admission: RE | Admit: 2021-03-14 | Discharge: 2021-03-14 | Disposition: A | Discharge status: Home / Self Care | Payer: BC Managed Care – PPO | Source: Ambulatory Visit | Attending: Gynecologic Oncology | Admitting: Gynecologic Oncology

## 2021-03-14 DIAGNOSIS — Z8041 Family history of malignant neoplasm of ovary: Secondary | ICD-10-CM | POA: Insufficient documentation

## 2021-03-15 LAB — CA 125: Cancer Antigen (CA) 125: 16.2 U/mL (ref 0.0–38.1)

## 2021-03-17 ENCOUNTER — Ambulatory Visit: Payer: Self-pay

## 2021-03-18 ENCOUNTER — Encounter: Payer: Self-pay | Admitting: Gynecologic Oncology

## 2021-03-18 ENCOUNTER — Other Ambulatory Visit: Payer: Self-pay

## 2021-03-18 ENCOUNTER — Ambulatory Visit: Payer: Self-pay

## 2021-03-18 ENCOUNTER — Inpatient Hospital Stay (HOSPITAL_BASED_OUTPATIENT_CLINIC_OR_DEPARTMENT_OTHER): Payer: BC Managed Care – PPO | Admitting: Gynecologic Oncology

## 2021-03-18 VITALS — BP 126/76 | HR 74 | Temp 97.7°F | Resp 18 | Ht 65.0 in | Wt 164.0 lb

## 2021-03-18 DIAGNOSIS — Z8041 Family history of malignant neoplasm of ovary: Secondary | ICD-10-CM | POA: Diagnosis not present

## 2021-03-18 NOTE — Progress Notes (Signed)
Gynecologic Oncology Follow-up Note  Chief Complaint:  Chief Complaint  Patient presents with   Family history of ovarian cancer    Consult was initially requested by Dr. Jana Hakim for the evaluation of Hannah Woodard 50 y.o. female  Assessment/Plan:  Hannah Woodard  is a 50 y.o.  year old with a personal history of triple positive breast cancer at age 89 and family history of ovarian cancer, BRCA negative on genetic testing. We will plan on scheduling transvaginal ultrasound scan and Ca1 25 at 6 monthly intervals and continue 12 monthly in-person evaluations until she decides it is time for her to have a definitive BSO (she would like to hold off until after she has come off ovarian suppressive medication and attempts pregnancy). She will be seen annually in June and she will have 6 monthly US's and CA 125 checks.  She will see Joylene John, NP at her next visit due to my planned departure from the [facility] in October.   HPI: Hannah Woodard is a 50 year old P0 who was seen in consultation at the request of Dr Jana Hakim for an increased risk of ovarian cancer.   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  This was treated with surgery radiation and chemotherapy.  She also received trastuzumab, and then goserelin injections for ovarian suppression and anastrozole.  She has remained disease-free since her initial diagnosis.  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  She reported a family history of a mother who had ovarian cancer in her early 28s.  The patient has received genetic testing for BRCA and HRD and this was negative.  Her sibling had her ovaries removed for prophylactic reasons with no malignancy identified.    The patient is nulliparous but desires future pregnancy.  She has sought consultation with [facility] reproductive endocrinology.  They feel that she is not a good candidate for egg  donation due to her advanced maternal age, however with donation of either an embryo or an egg donor and insemination she might be able to conceive through IVF.  She would like to keep her options open regarding future pregnancy and declined oophorectomy at that time.  She continued to have menopausal symptoms while on goserelin injections.  She uses [place] and coconut oil and other lubricants for intercourse and use of a vaginal dilator for her genital atrophy symptoms.  After consultation, she elected for close monitoring until closer to age 56 or natural menopause.   She underwent a transvaginal ultrasound for screening in June 2020 which revealed a prominent right ovary.  It was repeated on June 02, 2019 and showed bilaterally normal ovaries and uterus.  Ca1 25 was assessed in June 2020 was normal at 13.1.  Ca1 25 on September 01, 2019 was stable and normal at 13.9.  She reported her last Pap was in 2019 and was normal and she is due again for repeat Pap in 2022.  TVUS was performed on 09/23/20 and showed stable normal appearing ovaries with benign appearing calcifications.    Interval Hx:  TVUS was performed on 03/14/21 and showed a stable, normal appearing uterus and bilateral ovaries.    Current Meds:  Outpatient Encounter Medications as of 03/18/2021  Medication Sig   acetic acid-hydrocortisone (VOSOL-HC) OTIC solution Place 3 drops into the right ear 2 (two) times daily. (Patient not taking: No sig reported)   anastrozole (ARIMIDEX) 1 MG  tablet Take 1 tablet (1 mg total) by mouth daily.   ascorbic acid (VITAMIN C) 1000 MG tablet Take by mouth.   b complex vitamins tablet Take 1 tablet by mouth daily.   Bioflavonoid Products (QUERCETIN COMPLEX IMMUNE PO) Take by mouth. W/ bromelain (Patient not taking: Reported on 12/31/2020)   Cholecalciferol (VITAMIN D3) 5000 units TABS Take 10,000 Units by mouth daily.   clobetasol (TEMOVATE) 0.05 % external solution Apply topically 2 (two)  times daily as needed.   COLLAGEN PO Take by mouth daily. Takes beef collagen   desoximetasone (TOPICORT) 0.25 % cream Apply topically daily as needed.   fish oil-omega-3 fatty acids 1000 MG capsule Take 1 g by mouth daily.   gabapentin (NEURONTIN) 300 MG capsule Take 1 capsule (300 mg total) by mouth at bedtime.   glucosamine-chondroitin 500-400 MG tablet Take 3 tablets by mouth daily.   Green Tea 200 MG CAPS Take 1 capsule by mouth daily in the afternoon.   ketoconazole (NIZORAL) 2 % cream APPLY TO AFFECTED AREA EVERY DAY (Patient not taking: No sig reported)   Magnesium Citrate POWD Take by mouth daily.   Melatonin 3 MG CAPS Take 1 capsule by mouth at bedtime. (Patient not taking: Reported on 12/31/2020)   Menaquinone-7 (VITAMIN K2 PO) Take 150 mcg by mouth daily.   nystatin ointment (MYCOSTATIN) Apply 1 application topically 2 (two) times daily. (Patient not taking: No sig reported)   Psyllium 48.57 % POWD Take 10 mLs by mouth once a week. (Patient not taking: Reported on 12/31/2020)   senna (SENOKOT) 8.6 MG tablet Take 1 tablet by mouth at bedtime as needed for constipation.   triamcinolone ointment (KENALOG) 0.1 % Apply 1 application topically 2 (two) times daily.   TURMERIC PO Take 1 capsule by mouth as directed.    UNABLE TO FIND Take 500 mg by mouth daily. [place] mushroom   UNABLE TO FIND Take 500 mg by mouth daily. [place] Mushroom   UNABLE TO FIND Take 1 tablet by mouth daily in the afternoon. Lipo-thiamine 62m alpha lipoic acid 7.534msupplement   Zinc 50 MG TABS    [DISCONTINUED] prochlorperazine (COMPAZINE) 10 MG tablet Take 1 tablet (10 mg total) by mouth every 6 (six) hours as needed (Nausea or vomiting).   No facility-administered encounter medications on file as of 03/18/2021.    Allergy:  Allergies  Allergen Reactions   Sulfamethoxazole-Trimethoprim Rash   Adhesive [Tape]    Milk-Related Compounds Diarrhea    Stomach cramping   Other Rash and Other (See Comments)     Frangrance   Poison Ivy Extract [Poison Ivy Extract] Hives, Itching and Rash    Social Hx:   Social History   Socioeconomic History   Marital status: Married    Spouse name: Not on file   Number of children: Not on file   Years of education: Not on file   Highest education level: Not on file  Occupational History   Occupation: laMultimedia programmerTobacco Use   Smoking status: Never   Smokeless tobacco: Never  Vaping Use   Vaping Use: Never used  Substance and Sexual Activity   Alcohol use: Not Currently    Comment: rarely    Drug use: No   Sexual activity: Yes    Partners: Male    Birth control/protection: Other-see comments    Comment: chemical menopaus  Other Topics Concern   Not on file  Social History Narrative   Not on file   Social  Determinants of Health   Financial Resource Strain: Not on file  Food Insecurity: Not on file  Transportation Needs: Not on file  Physical Activity: Not on file  Stress: Not on file  Social Connections: Not on file  Intimate Partner Violence: Not on file    Past Surgical Hx:  Past Surgical History:  Procedure Laterality Date   PORT-A-CATH REMOVAL Left 2020   right lumpectomy, sentinel node biopsy, and bilateral mammoplasty  10/2017   [facility]    Past Medical Hx:  Past Medical History:  Diagnosis Date   Breast cancer ([place])    Family history of ovarian cancer 09/02/2017   Family history of prostate cancer in father 09/02/2017   Family history of uterine cancer 09/02/2017   Plantar fasciitis     Past Gynecological History:  Amenorrheic on goserelin.G0  Patient's last menstrual period was 10/05/2017.  Family Hx:  Family History  Problem Relation Age of Onset   Ovarian cancer Mother 77   Prostate cancer Father 43       'high gleason' unsure number   Ulcerative colitis Sister    Other Sister        abn ovaian growth, partial hysterectomy   Basal cell carcinoma Sister 50   Uterine cancer Maternal Aunt 55   Stroke  Maternal Aunt 66   Basal cell carcinoma Brother    Skin cancer Paternal Uncle    Skin cancer Maternal Grandmother    Stroke Paternal Grandfather 44   Hydrocephalus Cousin     Review of Systems:  Constitutional  Feels well,    ENT Normal appearing ears and nares bilaterally Skin/Breast  No rash, sores, jaundice, itching, dryness Cardiovascular  No chest pain, shortness of breath, or edema  Pulmonary  No cough or wheeze.  Gastro Intestinal  No nausea, vomitting, or diarrhoea. No bright red blood per rectum, no abdominal pain, change in bowel movement, or constipation.  Genito Urinary  No frequency, urgency, dysuria, no bleeding Musculo Skeletal  No myalgia, arthralgia, joint swelling or pain  Neurologic  No weakness, numbness, change in gait,  Psychology  No depression, anxiety, insomnia.   Vitals:   not available  Physical Exam: WD in NAD Neck  Supple NROM, without any enlargements.  Lymph Node Survey No cervical supraclavicular or inguinal adenopathy Cardiovascular  Pulse normal rate, regularity and rhythm. S1 and S2 normal.  Lungs  Clear to auscultation bilateraly, without wheezes/crackles/rhonchi. Good air movement.  Skin  No rash/lesions/breakdown  Psychiatry  Alert and oriented to person, place, and time  Abdomen  Normoactive bowel sounds, abdomen soft, non-tender and nonobese without evidence of hernia.  Back No CVA tenderness Genito Urinary  Vulva/vagina: Normal external female genitalia.  No lesions. No discharge or bleeding.  Bladder/urethra:  No lesions or masses, well supported bladder  Vagina: normal  Cervix: Normal appearing, no lesions.  Uterus:  Small, mobile, no parametrial involvement or nodularity.  Adnexa: no palpable masses. Rectal  deferred Extremities  No bilateral cyanosis, clubbing or edema.   Thereasa Solo, MD  03/18/2021, 6:16 PM

## 2021-03-18 NOTE — Patient Instructions (Signed)
Dr Denman George has scheduled you for a repeat ultrasound and CA 125 check in 6 months.  Dr Denman George is departing the [place] [facility] in October, 2022. Her partners and colleagues including Dr Berline Lopes, Dr Delsa Sale and Joylene John, Nurse Practitioner will be available to continue your care.   You are next scheduled to return to the Gynecologic Oncology office at the [facility] in June, 2023. Please call [phone] in January, 2023 to request an appointment for June with Dr Serita Grit partner, Joylene John, NP.

## 2021-03-28 ENCOUNTER — Encounter: Payer: Self-pay | Admitting: Oncology

## 2021-03-28 ENCOUNTER — Ambulatory Visit: Payer: BC Managed Care – PPO

## 2021-04-21 ENCOUNTER — Other Ambulatory Visit: Payer: Self-pay | Admitting: *Deleted

## 2021-04-21 DIAGNOSIS — C50211 Malignant neoplasm of upper-inner quadrant of right female breast: Secondary | ICD-10-CM

## 2021-04-23 ENCOUNTER — Inpatient Hospital Stay: Payer: BC Managed Care – PPO | Attending: Oncology | Admitting: Oncology

## 2021-04-23 ENCOUNTER — Other Ambulatory Visit: Payer: Self-pay | Admitting: Oncology

## 2021-04-23 ENCOUNTER — Inpatient Hospital Stay: Payer: BC Managed Care – PPO

## 2021-04-23 ENCOUNTER — Ambulatory Visit (HOSPITAL_COMMUNITY)
Admission: RE | Admit: 2021-04-23 | Discharge: 2021-04-23 | Disposition: A | Discharge status: Home / Self Care | Payer: BC Managed Care – PPO | Source: Ambulatory Visit | Attending: Oncology | Admitting: Oncology

## 2021-04-23 ENCOUNTER — Other Ambulatory Visit: Payer: Self-pay | Admitting: *Deleted

## 2021-04-23 ENCOUNTER — Other Ambulatory Visit: Payer: Self-pay

## 2021-04-23 VITALS — BP 122/85 | HR 76 | Temp 97.5°F | Resp 20 | Wt 169.5 lb

## 2021-04-23 DIAGNOSIS — T451X5A Adverse effect of antineoplastic and immunosuppressive drugs, initial encounter: Secondary | ICD-10-CM | POA: Diagnosis not present

## 2021-04-23 DIAGNOSIS — C50211 Malignant neoplasm of upper-inner quadrant of right female breast: Secondary | ICD-10-CM | POA: Diagnosis present

## 2021-04-23 DIAGNOSIS — Z170 Estrogen receptor positive status [ER+]: Secondary | ICD-10-CM | POA: Insufficient documentation

## 2021-04-23 DIAGNOSIS — G620 Drug-induced polyneuropathy: Secondary | ICD-10-CM

## 2021-04-23 DIAGNOSIS — Z79811 Long term (current) use of aromatase inhibitors: Secondary | ICD-10-CM | POA: Insufficient documentation

## 2021-04-23 DIAGNOSIS — R059 Cough, unspecified: Secondary | ICD-10-CM | POA: Insufficient documentation

## 2021-04-23 DIAGNOSIS — Z79899 Other long term (current) drug therapy: Secondary | ICD-10-CM | POA: Insufficient documentation

## 2021-04-23 DIAGNOSIS — Z8041 Family history of malignant neoplasm of ovary: Secondary | ICD-10-CM | POA: Diagnosis present

## 2021-04-23 DIAGNOSIS — C50911 Malignant neoplasm of unspecified site of right female breast: Secondary | ICD-10-CM | POA: Diagnosis present

## 2021-04-23 DIAGNOSIS — K219 Gastro-esophageal reflux disease without esophagitis: Secondary | ICD-10-CM | POA: Insufficient documentation

## 2021-04-23 LAB — CMP (CANCER CENTER ONLY)
ALT: 18 U/L (ref 0–44)
AST: 19 U/L (ref 15–41)
Albumin: 3.9 g/dL (ref 3.5–5.0)
Alkaline Phosphatase: 95 U/L (ref 38–126)
Anion gap: 9 (ref 5–15)
BUN: 12 mg/dL (ref 6–20)
CO2: 30 mmol/L (ref 22–32)
Calcium: 9.4 mg/dL (ref 8.9–10.3)
Chloride: 101 mmol/L (ref 98–111)
Creatinine: 0.63 mg/dL (ref 0.44–1.00)
GFR, Estimated: >60 mL/min (ref >60)
Glucose, Bld: 94 mg/dL (ref 70–99)
Potassium: 4.1 mmol/L (ref 3.5–5.1)
Sodium: 140 mmol/L (ref 135–145)
Total Bilirubin: 0.8 mg/dL (ref 0.3–1.2)
Total Protein: 7.1 g/dL (ref 6.5–8.1)

## 2021-04-23 LAB — CBC WITH DIFFERENTIAL (CANCER CENTER ONLY)
Abs Immature Granulocytes: 0.02 10*3/uL (ref 0.00–0.07)
Basophils Absolute: 0 10*3/uL (ref 0.0–0.1)
Basophils Relative: 1 %
Eosinophils Absolute: 0.2 10*3/uL (ref 0.0–0.5)
Eosinophils Relative: 3 %
HCT: 36.9 % (ref 36.0–46.0)
Hemoglobin: 13 g/dL (ref 12.0–15.0)
Immature Granulocytes: 0 %
Lymphocytes Relative: 22 %
Lymphs Abs: 1.3 10*3/uL (ref 0.7–4.0)
MCH: 33.8 pg (ref 26.0–34.0)
MCHC: 35.2 g/dL (ref 30.0–36.0)
MCV: 95.8 fL (ref 80.0–100.0)
Monocytes Absolute: 0.5 10*3/uL (ref 0.1–1.0)
Monocytes Relative: 9 %
Neutro Abs: 3.7 10*3/uL (ref 1.7–7.7)
Neutrophils Relative %: 65 %
Platelet Count: 217 10*3/uL (ref 150–400)
RBC: 3.85 MIL/uL — ABNORMAL LOW (ref 3.87–5.11)
RDW: 13.1 % (ref 11.5–15.5)
WBC Count: 5.7 10*3/uL (ref 4.0–10.5)
nRBC: 0 % (ref 0.0–0.2)

## 2021-04-23 MED ORDER — ANASTROZOLE 1 MG PO TABS: 1.0000 mg | ORAL_TABLET | Freq: Every day | ORAL | 4 refills | Status: DC

## 2021-04-23 MED ORDER — GABAPENTIN 300 MG PO CAPS: 300.0000 mg | ORAL_CAPSULE | Freq: Every day | ORAL | 4 refills | Status: DC

## 2021-04-23 NOTE — Progress Notes (Signed)
x

## 2021-04-23 NOTE — Progress Notes (Signed)
[place]  [phone] [phone]     ID: Hannah Woodard DOB: Nov 28, 1970  MR#: [id number]  OIB#:[id number]  Patient Care Team: Unk Pinto, MD as PCP - General (Internal Medicine) Jovita Kussmaul, MD as Consulting Physician (General Surgery) Jill Ruppe, Virgie Dad, MD as Consulting Physician (Oncology) Kyung Rudd, MD as Consulting Physician (Radiation Oncology) Lorelle Gibbs, MD (Radiology) Angelina Ok, MD as Referring Physician (Surgery) Everitt Amber, MD as Consulting Physician (Gynecologic Oncology) OTHER MD:  CHIEF COMPLAINT: Triple positive breast cancer  CURRENT TREATMENT: anastrozole, Zoladex   INTERVAL HISTORY: Hannah Woodard returns today for follow-up of her triple positive breast cancer.   She continues on anastrozole.  She does have some arthralgias and myalgias which might be related.  She is dealing with this by using CBD oil and that is working for her.  She receives Zoladex every 12 weeks.  She has no side effects related to this that she is aware of.  Her most recent bone density was on 10/30/2020 at [facility] and showed a T score of -0.8  Since her last visit, she underwent bilateral diagnostic mammography with tomography and left breast ultrasonography at [facility] on 11/15/2019 showing: breast density category C; calcification in far posterior right breast near lumpectomy site, short-term follow-up recommended; left breast mass, likely representing fat necrosis.   Her most recent pelvic ultrasound performed on 03/14/2021 showed no suspicious ovarian or adnexal mass.  There were small uterine leiomyomata.    REVIEW OF SYSTEMS: Hannah Woodard is not exercising as much as she would like.  She tells me she has a persistent cough.  She is being evaluated for this by Dr. Darron Doom.  Referral to Dr. Donneta Romberg is in process.  Aside from this a detailed review of systems was stable   HISTORY OF CURRENT ILLNESS: From the original  intake note:  The patient has a history of cysts and nodules in the right breast which have been closely followed, with exams 07/30/2016 and 01/28/2017.  On 07/29/2017 follow-up right breast ultrasonography showed no findings of concern.  Bilateral diagnostic mammography 08/18/2017 with right breast ultrasonography on 08/18/2017 at [facility] found the breast density to be category C.  There was now a new irregular high density mass in the posterior portion of the right breast seen on the mediolateral oblique view only.  Ultrasonography confirmed a 1.4 cm lobulated mass in the upper inner quadrant of the right breast 10 cm from the nipple associated with pectoral muscle invasion.  The right axilla was sonographically benign.  Biopsy of this mass 08/24/2017 showed ([id number]) invasive ductal carcinoma, grade 2, estrogen receptor 95% positive, progesterone receptor 100% positive, both with strong staining intensity, with an MIB-1 of 60%, and HER-2 amplified, with a signals ratio of 2.84 (full report pending).  The patient's subsequent history is as detailed below.   PAST MEDICAL HISTORY: Past Medical History:  Diagnosis Date   Breast cancer ([place])    Family history of ovarian cancer 09/02/2017   Family history of prostate cancer in father 09/02/2017   Family history of uterine cancer 09/02/2017   Plantar fasciitis     PAST SURGICAL HISTORY: Past Surgical History:  Procedure Laterality Date   PORT-A-CATH REMOVAL Left 2020   right lumpectomy, sentinel node biopsy, and bilateral mammoplasty  10/2017   [facility]    FAMILY HISTORY Family History  Problem Relation Age of Onset   Ovarian cancer Mother 73   Prostate cancer Father 38       '  high gleason' unsure number   Ulcerative colitis Sister    Other Sister        abn ovaian growth, partial hysterectomy   Basal cell carcinoma Sister 79   Uterine cancer Maternal Aunt 32   Stroke Maternal Aunt 66   Basal cell carcinoma Brother    Skin cancer  Paternal Uncle    Skin cancer Maternal Grandmother    Stroke Paternal Grandfather 73   Hydrocephalus Cousin   The patient's father died from alcoholic cirrhosis at age 34.  He had been diagnosed with prostate cancer at age 85.  The patient's mother was diagnosed with ovarian cancer at age 4 and died a year later.  The patient has 1 brother, 1 sister.  The patient's brother has had basal cell and other skin cancers.  A paternal aunt had uterine cancer.  Other relatives have had skin cancers but the patient does not know if these were melanomas or not   GYNECOLOGIC HISTORY:  Patient's last menstrual period was 10/05/2017.  Menarche age 47, the patient has never been pregnant.  She is still having regular periods.  She used oral contraceptives briefly in the past without complications. She and her husband are very interested in having children, and currently (December 2018) they are not using any contraception.   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  Her husband Rodman Key (goes by [employer]") is a Dealer.  At home it's just them, a 48 year old [place] hound and a cat.   ADVANCED DIRECTIVES: In the absence of any documents to the contrary the patient's husband is her healthcare power of attorney   HEALTH MAINTENANCE: Social History   Tobacco Use   Smoking status: Never   Smokeless tobacco: Never  Vaping Use   Vaping Use: Never used  Substance Use Topics   Alcohol use: Not Currently    Comment: rarely    Drug use: No     Colonoscopy: Never  PAP:  Bone density:    Allergies  Allergen Reactions   Sulfamethoxazole-Trimethoprim Rash   Adhesive [Tape]    Milk-Related Compounds Diarrhea    Stomach cramping   Other Rash and Other (See Comments)    Frangrance   Poison Ivy Extract [Poison Ivy Extract] Hives, Itching and Rash    Current Outpatient Medications  Medication Sig Dispense Refill   acetic  acid-hydrocortisone (VOSOL-HC) OTIC solution Place 3 drops into the right ear 2 (two) times daily. (Patient not taking: No sig reported) 10 mL 1   anastrozole (ARIMIDEX) 1 MG tablet Take 1 tablet (1 mg total) by mouth daily. 90 tablet 4   ascorbic acid (VITAMIN C) 1000 MG tablet Take by mouth.     b complex vitamins tablet Take 1 tablet by mouth daily.     Bioflavonoid Products (QUERCETIN COMPLEX IMMUNE PO) Take by mouth. W/ bromelain (Patient not taking: Reported on 12/31/2020)     Cholecalciferol (VITAMIN D3) 5000 units TABS Take 10,000 Units by mouth daily.     clobetasol (TEMOVATE) 0.05 % external solution Apply topically 2 (two) times daily as needed.     COLLAGEN PO Take by mouth daily. Takes beef collagen     desoximetasone (TOPICORT) 0.25 % cream Apply topically daily as needed.     fish oil-omega-3 fatty acids 1000 MG capsule Take 1 g by mouth daily.     gabapentin (NEURONTIN) 300 MG capsule Take 1 capsule (300 mg total) by mouth at bedtime.  90 capsule 4   glucosamine-chondroitin 500-400 MG tablet Take 3 tablets by mouth daily.     Green Tea 200 MG CAPS Take 1 capsule by mouth daily in the afternoon.     ketoconazole (NIZORAL) 2 % cream APPLY TO AFFECTED AREA EVERY DAY (Patient not taking: No sig reported) 15 g 0   Magnesium Citrate POWD Take by mouth daily.     Melatonin 3 MG CAPS Take 1 capsule by mouth at bedtime. (Patient not taking: Reported on 12/31/2020)     Menaquinone-7 (VITAMIN K2 PO) Take 150 mcg by mouth daily.     nystatin ointment (MYCOSTATIN) Apply 1 application topically 2 (two) times daily. (Patient not taking: No sig reported) 30 g 0   Psyllium 48.57 % POWD Take 10 mLs by mouth once a week. (Patient not taking: Reported on 12/31/2020)     senna (SENOKOT) 8.6 MG tablet Take 1 tablet by mouth at bedtime as needed for constipation.     triamcinolone ointment (KENALOG) 0.1 % Apply 1 application topically 2 (two) times daily. 80 g 1   TURMERIC PO Take 1 capsule by mouth as  directed.      UNABLE TO FIND Take 500 mg by mouth daily. [place] mushroom     UNABLE TO FIND Take 500 mg by mouth daily. [place] Mushroom     UNABLE TO FIND Take 1 tablet by mouth daily in the afternoon. Lipo-thiamine 10m alpha lipoic acid 7.557msupplement     Zinc 50 MG TABS      No current facility-administered medications for this visit.    OBJECTIVE:  white woman who appears younger than stated age  Vi58  04/23/21 1445  BP: 122/85  Pulse: 76  Resp: 20  Temp: (!) 97.5 F (36.4 C)  SpO2: 99%     Body mass index is 28.21 kg/m.   Wt Readings from Last 3 Encounters:  04/23/21 169 lb 8 oz (76.9 kg)  03/18/21 164 lb (74.4 kg)  12/31/20 166 lb 9.6 oz (75.6 kg)   ECOG FS:1 - Symptomatic but completely ambulatory  Sclerae unicteric, EOMs intact Wearing a mask No cervical or supraclavicular adenopathy Lungs no rales or rhonchi Heart regular rate and rhythm Abd soft, nontender, positive bowel sounds, no masses palpated MSK no focal spinal tenderness, no upper extremity lymphedema Neuro: nonfocal, well oriented, appropriate affect Breasts: Status post bilateral reduction mammoplasty as well as right lumpectomy and radiation.  No evidence of local recurrence. Both axillae are benign.   LAB RESULTS:  CMP     Component Value Date/Time   NA 140 04/23/2021 1426   NA 138 09/01/2017 1238   K 4.1 04/23/2021 1426   K 4.4 09/01/2017 1238   CL 101 04/23/2021 1426   CO2 30 04/23/2021 1426   CO2 25 09/01/2017 1238   GLUCOSE 94 04/23/2021 1426   GLUCOSE 83 09/01/2017 1238   BUN 12 04/23/2021 1426   BUN 9.2 09/01/2017 1238   CREATININE 0.63 04/23/2021 1426   CREATININE 0.53 12/31/2020 1531   CREATININE 0.7 09/01/2017 1238   CALCIUM 9.4 04/23/2021 1426   CALCIUM 9.4 09/01/2017 1238   PROT 7.1 04/23/2021 1426   PROT 7.4 09/01/2017 1238   ALBUMIN 3.9 04/23/2021 1426   ALBUMIN 4.2 09/01/2017 1238   AST 19 04/23/2021 1426   AST 21 09/01/2017 1238   ALT 18 04/23/2021  1426   ALT 21 09/01/2017 1238   ALKPHOS 95 04/23/2021 1426   ALKPHOS 60 09/01/2017 1238  BILITOT 0.8 04/23/2021 1426   BILITOT 0.65 09/01/2017 1238   GFRNONAA >60 04/23/2021 1426   GFRNONAA 113 12/31/2020 1531   GFRAA 131 12/31/2020 1531    No results found for: Ronnald Ramp, A1GS, A2GS, BETS, BETA2SER, GAMS, MSPIKE, SPEI  No results found for: Nils Pyle, [facility]  Lab Results  Component Value Date   WBC 5.7 04/23/2021   NEUTROABS 3.7 04/23/2021   HGB 13.0 04/23/2021   HCT 36.9 04/23/2021   MCV 95.8 04/23/2021   PLT 217 04/23/2021      Chemistry      Component Value Date/Time   NA 140 04/23/2021 1426   NA 138 09/01/2017 1238   K 4.1 04/23/2021 1426   K 4.4 09/01/2017 1238   CL 101 04/23/2021 1426   CO2 30 04/23/2021 1426   CO2 25 09/01/2017 1238   BUN 12 04/23/2021 1426   BUN 9.2 09/01/2017 1238   CREATININE 0.63 04/23/2021 1426   CREATININE 0.53 12/31/2020 1531   CREATININE 0.7 09/01/2017 1238      Component Value Date/Time   CALCIUM 9.4 04/23/2021 1426   CALCIUM 9.4 09/01/2017 1238   ALKPHOS 95 04/23/2021 1426   ALKPHOS 60 09/01/2017 1238   AST 19 04/23/2021 1426   AST 21 09/01/2017 1238   ALT 18 04/23/2021 1426   ALT 21 09/01/2017 1238   BILITOT 0.8 04/23/2021 1426   BILITOT 0.65 09/01/2017 1238       No results found for: LABCA2  No components found for: [id number]  No results for input(s): INR in the last 168 hours.  No results found for: LABCA2  No results found for: CAN199  Lab Results  Component Value Date   CAN125 16.2 03/14/2021    No results found for: YBW389  No results found for: CA2729  No components found for: HGQUANT  No results found for: CEA1 / No results found for: CEA1   No results found for: AFPTUMOR  No results found for: CHROMOGRNA  No results found for: PSA1  Appointment on 04/23/2021  Component Date Value Ref Range Status   Sodium 04/23/2021 140  135 - 145 mmol/L Final    Potassium 04/23/2021 4.1  3.5 - 5.1 mmol/L Final   Chloride 04/23/2021 101  98 - 111 mmol/L Final   CO2 04/23/2021 30  22 - 32 mmol/L Final   Glucose, Bld 04/23/2021 94  70 - 99 mg/dL Final   Glucose reference range applies only to samples taken after fasting for at least 8 hours.   BUN 04/23/2021 12  6 - 20 mg/dL Final   Creatinine 04/23/2021 0.63  0.44 - 1.00 mg/dL Final   Calcium 04/23/2021 9.4  8.9 - 10.3 mg/dL Final   Total Protein 04/23/2021 7.1  6.5 - 8.1 g/dL Final   Albumin 04/23/2021 3.9  3.5 - 5.0 g/dL Final   AST 04/23/2021 19  15 - 41 U/L Final   ALT 04/23/2021 18  0 - 44 U/L Final   Alkaline Phosphatase 04/23/2021 95  38 - 126 U/L Final   Total Bilirubin 04/23/2021 0.8  0.3 - 1.2 mg/dL Final   GFR, Estimated 04/23/2021 >60  >60 mL/min Final   Comment: (NOTE) Calculated using the CKD-EPI Creatinine Equation (2021)    Anion gap 04/23/2021 9  5 - 15 Final   Performed at [facility], [place] [address], [place], [place] [address]   WBC Count 04/23/2021 5.7  4.0 - 10.5 K/uL Final   RBC 04/23/2021 3.85 (A)  3.87 - 5.11 MIL/uL Final   Hemoglobin 04/23/2021 13.0  12.0 - 15.0 g/dL Final   HCT 04/23/2021 36.9  36.0 - 46.0 % Final   MCV 04/23/2021 95.8  80.0 - 100.0 fL Final   MCH 04/23/2021 33.8  26.0 - 34.0 pg Final   MCHC 04/23/2021 35.2  30.0 - 36.0 g/dL Final   RDW 04/23/2021 13.1  11.5 - 15.5 % Final   Platelet Count 04/23/2021 217  150 - 400 K/uL Final   nRBC 04/23/2021 0.0  0.0 - 0.2 % Final   Neutrophils Relative % 04/23/2021 65  % Final   Neutro Abs 04/23/2021 3.7  1.7 - 7.7 K/uL Final   Lymphocytes Relative 04/23/2021 22  % Final   Lymphs Abs 04/23/2021 1.3  0.7 - 4.0 K/uL Final   Monocytes Relative 04/23/2021 9  % Final   Monocytes Absolute 04/23/2021 0.5  0.1 - 1.0 K/uL Final   Eosinophils Relative 04/23/2021 3  % Final   Eosinophils Absolute 04/23/2021 0.2  0.0 - 0.5 K/uL Final   Basophils Relative 04/23/2021 1  % Final   Basophils Absolute  04/23/2021 0.0  0.0 - 0.1 K/uL Final   Immature Granulocytes 04/23/2021 0  % Final   Abs Immature Granulocytes 04/23/2021 0.02  0.00 - 0.07 K/uL Final   Performed at [facility], [address] [place], [place] [address]    (this displays the last labs from the last 3 days)  No results found for: TOTALPROTELP, ALBUMINELP, A1GS, A2GS, BETS, BETA2SER, GAMS, MSPIKE, SPEI (this displays SPEP labs)  No results found for: KPAFRELGTCHN, LAMBDASER, KAPLAMBRATIO (kappa/lambda light chains)  No results found for: HGBA, HGBA2QUANT, HGBFQUANT, HGBSQUAN (Hemoglobinopathy evaluation)   No results found for: LDH  Lab Results  Component Value Date   IRON 87 12/31/2020   TIBC 276 12/31/2020   IRONPCTSAT 32 12/31/2020   (Iron and TIBC)  Lab Results  Component Value Date   FERRITIN 262 (H) 12/31/2020    Urinalysis    Component Value Date/Time   COLORURINE YELLOW 12/31/2020 [place] 12/31/2020 1531   LABSPEC 1.006 12/31/2020 1531   PHURINE 8.0 12/31/2020 [place] 12/31/2020 [place] 12/31/2020 [place] 12/27/2017 1905   KETONESUR NEGATIVE 12/31/2020 1531   PROTEINUR NEGATIVE 12/31/2020 1531   UROBILINOGEN 0.2 12/06/2014 1529   NITRITE NEGATIVE 12/31/2020 1531   LEUKOCYTESUR 2+ (A) 12/31/2020 1531    STUDIES: No results found.  Bilateral diagnostic mammography with tomography at [facility] 11/15/2019 Calcifications in the far posterior right breast near the lumpectomy site. These would not be amenable to stereotactic biopsy given their location. A short-term follow-up is recommended.   Left breast mass, likely representing fat necrosis.   Breast composition: Scattered fibroglandular density.   BI-RADS Category: 3 - Probably benign findings.   RECOMMENDATION: Short Interval Follow-up with bilateral diagnostic mammogram and possible left breast ultrasound in 6 months. Additionally, given  that the patient has a far posterior lumpectomy site incompletely evaluated mammographically, yearly MRI is recommended.    ELIGIBLE FOR AVAILABLE RESEARCH PROTOCOL: No  ASSESSMENT: 50 y.o. [place] woman status post right breast upper inner quadrant biopsy 08/24/2017 for a clinical T1c N0 invasive ductal carcinoma, triple positive, with an MIB-1 of 60%  (1) genetics testing 09/10/2017 through the Common Hereditary Cancer Panel + Melanoma Panel found no deleterious mutations in: APC, ATM, AXIN2, BAP1, BARD1, BMPR1A, BRCA1, BRCA2, BRIP1, CDH1, CDK4, CDKN2A (p14ARF), CDKN2A (  p16INK4a), CHEK2, CTNNA1, DICER1, EPCAM*, GREM1*, KIT, MEN1, MLH1, MSH2, MSH3, MSH6, MUTYH, NBN, NF1, PALB2, PDGFRA, PMS2, POLD1, POLE, POT1, PTEN, RAD50, RAD51C, RAD51D, RB1, SDHB, SDHC, SDHD, SMAD4, SMARCA4, STK11, TP53, TSC1, TSC2, VHL. 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  (a) baseline echocardiogram 09/10/2017 shows an ejection fraction of 55-60%  (b) echocardiogram 12/09/2017 showed an ejection fraction in the 60-65% range  (c) echo in 06/14/2018 shows EF of 60-65%  (d) echo on 10/17/2018 shows EF of 60-65%  (5) adjuvant radiation 02/03/2018 - 03/18/2018  Site/dose:   The patient initially received a dose of 50.4 Gy in 28 fractions to the breast using whole-breast tangent fields. This was delivered using a 3-D conformal technique. The patient then  received a boost to the seroma. This delivered an additional 10 Gy in 5 fractions using 9e electrons with a special teletherapy technique. The total dose was 60.4 Gy.   (6) anastrozole started 03/28/2018  (a) bone density 09/05/2018--normal  (b) patient refuses tamoxifen because of family history of strokes and uterine cancer  (c) breast cancer index predicts a risk of late recurrence of 10.6% and suggest significant benefit from continuing endocrine therapy for an extended period  (d) repeat bone density 10/30/2020 shows a T score of -0.8, normal  (7) goserelin started 09/29/2017, discontinued after 08/05/2018 dose, restarted on 11/04/2018 when her menses resumed  (a) changed to every 66-monthdose beginning November 2021   PLAN: JSonia Balleris now 2-1/2 years out from definitive surgery for her breast cancer with no evidence of disease recurrence.  This is very favorable.  She is tolerating anastrozole/goserelin well and the plan is to continue a minimum of 5 years.  Given her breast cancer index results most likely we would continue the anastrozole for a total of 7 years.  She is having excellent results on her bone density.  This will be repeated in February 2024.  My suspicion is that her cough is mostly going to be due to reflux.  I think referral to Dr. SDonneta Rombergis a good idea as he can simplify her multi allergy medications if nothing else.  We will have encouraged her to do as much weightbearing exercise as she can manage.  She knows to call for any other issue that may develop before the next visit  Total encounter time 25 minutes.*Sarajane JewsC. Jona Zappone, MD  04/23/21 3:08 PM Medical Oncology and H[facility]ology [facility] [place] [id number]. [phone]   Fax. [phone]  I, KWilburn Mylar am acting as scribe for Dr. GVirgie Dad Yalda Herd.  I, GLurline DelMD, have reviewed the above documentation for accuracy and completeness, and I agree  with the above.   *Total Encounter Time as defined by the Centers for Medicare and Medicaid Services includes, in addition to the face-to-face time of a patient visit (documented in the note above) non-face-to-face time: obtaining and reviewing outside history, ordering and reviewing medications, tests or procedures, care  coordination (communications with other health care professionals or caregivers) and documentation in the medical record.

## 2021-05-19 ENCOUNTER — Encounter: Payer: Self-pay | Admitting: Physical Medicine and Rehabilitation

## 2021-06-03 ENCOUNTER — Inpatient Hospital Stay: Payer: BC Managed Care – PPO | Attending: Oncology

## 2021-06-03 ENCOUNTER — Other Ambulatory Visit: Payer: Self-pay

## 2021-06-03 VITALS — BP 141/84 | HR 79 | Temp 98.6°F | Resp 18

## 2021-06-03 DIAGNOSIS — Z170 Estrogen receptor positive status [ER+]: Secondary | ICD-10-CM | POA: Diagnosis not present

## 2021-06-03 DIAGNOSIS — C50211 Malignant neoplasm of upper-inner quadrant of right female breast: Secondary | ICD-10-CM | POA: Diagnosis not present

## 2021-06-03 DIAGNOSIS — Z95828 Presence of other vascular implants and grafts: Secondary | ICD-10-CM

## 2021-06-03 MED ORDER — GOSERELIN ACETATE 10.8 MG ~~LOC~~ IMPL
10.8000 mg | DRUG_IMPLANT | Freq: Once | SUBCUTANEOUS | Status: AC
  Administered 2021-06-03: 10.8 mg via SUBCUTANEOUS
  Filled 2021-06-03: qty 10.8

## 2021-06-03 NOTE — Patient Instructions (Signed)
Goserelin injection What is this medication? GOSERELIN (GOE se rel in) is similar to a hormone found in the body. It lowers the amount of sex hormones that the body makes. Men will have lower testosterone levels and women will have lower estrogen levels while taking this medicine. In men, this medicine is used to treat prostate cancer; the injection is either given once per month or once every 12 weeks. A once per month injection (only) is used to treat women with endometriosis, dysfunctional uterine bleeding, or advanced breast cancer. This medicine may be used for other purposes; ask your health care provider or pharmacist if you have questions. COMMON BRAND NAME(S): Zoladex What should I tell my care team before I take this medication? They need to know if you have any of these conditions: bone problems diabetes heart disease history of irregular heartbeat an unusual or allergic reaction to goserelin, other medicines, foods, dyes, or preservatives pregnant or trying to get pregnant breast-feeding How should I use this medication? This medicine is for injection under the skin. It is given by a health care professional in a hospital or clinic setting. Talk to your pediatrician regarding the use of this medicine in children. Special care may be needed. Overdosage: If you think you have taken too much of this medicine contact a poison control center or emergency room at once. NOTE: This medicine is only for you. Do not share this medicine with others. What if I miss a dose? It is important not to miss your dose. Call your doctor or health care professional if you are unable to keep an appointment. What may interact with this medication? Do not take this medicine with any of the following medications: cisapride dronedarone pimozide thioridazine This medicine may also interact with the following medications: other medicines that prolong the QT interval (an abnormal heart rhythm) This list  may not describe all possible interactions. Give your health care provider a list of all the medicines, herbs, non-prescription drugs, or dietary supplements you use. Also tell them if you smoke, drink alcohol, or use illegal drugs. Some items may interact with your medicine. What should I watch for while using this medication? Visit your doctor or health care provider for regular checks on your progress. Your symptoms may appear to get worse during the first weeks of this therapy. Tell your doctor or healthcare provider if your symptoms do not start to get better or if they get worse after this time. Your bones may get weaker if you take this medicine for a long time. If you smoke or frequently drink alcohol you may increase your risk of bone loss. A family history of osteoporosis, chronic use of drugs for seizures (convulsions), or corticosteroids can also increase your risk of bone loss. Talk to your doctor about how to keep your bones strong. This medicine should stop regular monthly menstruation in women. Tell your doctor if you continue to menstruate. Women should not become pregnant while taking this medicine or for 12 weeks after stopping this medicine. Women should inform their doctor if they wish to become pregnant or think they might be pregnant. There is a potential for serious side effects to an unborn child. Talk to your health care professional or pharmacist for more information. Do not breast-feed an infant while taking this medicine. Men should inform their doctors if they wish to father a child. This medicine may lower sperm counts. Talk to your health care professional or pharmacist for more information. This medicine may  increase blood sugar. Ask your healthcare provider if changes in diet or medicines are needed if you have diabetes. What side effects may I notice from receiving this medication? Side effects that you should report to your doctor or health care professional as soon as  possible: allergic reactions like skin rash, itching or hives, swelling of the face, lips, or tongue bone pain breathing problems changes in vision chest pain feeling faint or lightheaded, falls fever, chills pain, swelling, warmth in the leg pain, tingling, numbness in the hands or feet signs and symptoms of high blood sugar such as being more thirsty or hungry or having to urinate more than normal. You may also feel very tired or have blurry vision signs and symptoms of low blood pressure like dizziness; feeling faint or lightheaded, falls; unusually weak or tired stomach pain swelling of the ankles, feet, hands trouble passing urine or change in the amount of urine unusually high or low blood pressure unusually weak or tired Side effects that usually do not require medical attention (report to your doctor or health care professional if they continue or are bothersome): change in sex drive or performance changes in breast size in both males and females changes in emotions or moods headache hot flashes irritation at site where injected loss of appetite skin problems like acne, dry skin vaginal dryness This list may not describe all possible side effects. Call your doctor for medical advice about side effects. You may report side effects to FDA at 1-800-FDA-1088. Where should I keep my medication? This drug is given in a hospital or clinic and will not be stored at home. NOTE: This sheet is a summary. It may not cover all possible information. If you have questions about this medicine, talk to your doctor, pharmacist, or health care provider.  2022 Elsevier/Gold Standard (2019-01-02 14:05:56)

## 2021-08-26 ENCOUNTER — Other Ambulatory Visit: Payer: Self-pay

## 2021-08-26 ENCOUNTER — Ambulatory Visit: Payer: BC Managed Care – PPO | Admitting: Physical Medicine and Rehabilitation

## 2021-08-26 ENCOUNTER — Encounter: Payer: Self-pay | Admitting: Physical Medicine and Rehabilitation

## 2021-08-26 ENCOUNTER — Inpatient Hospital Stay: Payer: BC Managed Care – PPO | Attending: Oncology

## 2021-08-26 ENCOUNTER — Encounter (HOSPITAL_BASED_OUTPATIENT_CLINIC_OR_DEPARTMENT_OTHER): Payer: BC Managed Care – PPO | Admitting: Physical Medicine and Rehabilitation

## 2021-08-26 VITALS — BP 123/85 | HR 86 | Temp 98.4°F | Ht 65.0 in | Wt 164.6 lb

## 2021-08-26 DIAGNOSIS — T451X5A Adverse effect of antineoplastic and immunosuppressive drugs, initial encounter: Secondary | ICD-10-CM | POA: Insufficient documentation

## 2021-08-26 DIAGNOSIS — G620 Drug-induced polyneuropathy: Secondary | ICD-10-CM | POA: Insufficient documentation

## 2021-08-26 DIAGNOSIS — C50211 Malignant neoplasm of upper-inner quadrant of right female breast: Secondary | ICD-10-CM | POA: Insufficient documentation

## 2021-08-26 DIAGNOSIS — Z170 Estrogen receptor positive status [ER+]: Secondary | ICD-10-CM | POA: Insufficient documentation

## 2021-08-26 DIAGNOSIS — R232 Flushing: Secondary | ICD-10-CM | POA: Insufficient documentation

## 2021-08-26 DIAGNOSIS — Z95828 Presence of other vascular implants and grafts: Secondary | ICD-10-CM

## 2021-08-26 MED ORDER — GOSERELIN ACETATE 10.8 MG ~~LOC~~ IMPL
10.8000 mg | DRUG_IMPLANT | Freq: Once | SUBCUTANEOUS | Status: AC
  Administered 2021-08-26: 10.8 mg via SUBCUTANEOUS
  Filled 2021-08-26: qty 10.8

## 2021-08-26 NOTE — Progress Notes (Addendum)
Subjective:    Patient ID: Hannah Woodard, female    DOB: 1971/05/28, 50 y.o.   MRN: [id number]  HPI Hannah Woodard is a 50 year old woman who presents to establish care for peripheral neuropathy secondary to chemotherapy.   1) Chemotherapy induced peripheral neuropathy.  -she uses CBD oil with menthol and capsaicin and ice socks but these are sometimes too cold for her.  -pain on average is 3/10 -the neuropathy started with breast cancer chemotherapy.  -pain right now is 4/10 -pain is intermittent.  -she was on Taxol and Herceptin.  -she hates that she is on any drugs -she thinks she eats healthy and she takes vitamins  2) High iron level -she was told not to take iron supplement since her level was too high.   3) Hot flashes -she takes gabapentin with these  Pain Inventory Average Pain 3 Pain Right Now 4 My pain is intermittent  LOCATION OF PAIN  feet,toes,leg,neck,thigh,fingers,back,hip,neck  BOWEL Number of stools per week: 7 Oral laxative use fiber powder Type of laxative  Enema or suppository use No  History of colostomy No  Incontinent No   BLADDER Normal  Able to self cath No  Bladder incontinence No  Frequent urination No  Leakage with coughing No  Difficulty starting stream No  Incomplete bladder emptying Yes    Mobility walk without assistance ability to climb steps?  yes do you drive?  yes  Function employed # of hrs/week 37.5  Neuro/Psych numbness tingling  Prior Studies N/a  Physicians involved in your care N/a   Family History  Problem Relation Age of Onset   Ovarian cancer Mother 55   Prostate cancer Father 72       'high gleason' unsure number   Ulcerative colitis Sister    Other Sister        abn ovaian growth, partial hysterectomy   Basal cell carcinoma Sister 94   Uterine cancer Maternal Aunt 65   Stroke Maternal Aunt 66   Basal cell carcinoma Brother    Skin cancer Paternal Uncle    Skin cancer Maternal  Grandmother    Stroke Paternal Grandfather 22   Hydrocephalus Cousin    Social History   Socioeconomic History   Marital status: Married    Spouse name: Not on file   Number of children: Not on file   Years of education: Not on file   Highest education level: Not on file  Occupational History   Occupation: Multimedia programmer  Tobacco Use   Smoking status: Never   Smokeless tobacco: Never  Vaping Use   Vaping Use: Never used  Substance and Sexual Activity   Alcohol use: Not Currently    Comment: rarely    Drug use: No   Sexual activity: Yes    Partners: Male    Birth control/protection: Other-see comments    Comment: chemical menopaus  Other Topics Concern   Not on file  Social History Narrative   Not on file   Social Determinants of Health   Financial Resource Strain: Not on file  Food Insecurity: Not on file  Transportation Needs: Not on file  Physical Activity: Not on file  Stress: Not on file  Social Connections: Not on file   Past Surgical History:  Procedure Laterality Date   PORT-A-CATH REMOVAL Left 2020   right lumpectomy, sentinel node biopsy, and bilateral mammoplasty  10/2017   [facility]   Past Medical History:  Diagnosis Date   Breast cancer ([place])  Family history of ovarian cancer 09/02/2017   Family history of prostate cancer in father 09/02/2017   Family history of uterine cancer 09/02/2017   Plantar fasciitis    BP 123/85   Pulse 86   Temp 98.4 F (36.9 C) (Oral)   Ht 5' 5"  (1.651 m)   Wt 164 lb 9.6 oz (74.7 kg)   LMP 10/05/2017   SpO2 94%   BMI 27.39 kg/m   Opioid Risk Score:   Fall Risk Score:  `1  Depression screen PHQ 2/9  Depression screen [facility] 2/9 08/26/2021 12/31/2020 01/27/2018  Decreased Interest 0 0 0  Down, Depressed, Hopeless 1 0 0  PHQ - 2 Score 1 0 0  Altered sleeping 2 - -  Tired, decreased energy 2 - -  Change in appetite 0 - -  Feeling bad or failure about yourself  1 - -  Trouble concentrating 0 - -  Moving  slowly or fidgety/restless 0 - -  Suicidal thoughts 0 - -  PHQ-9 Score 6 - -  Some recent data might be hidden       Review of Systems  Constitutional:  Positive for chills, fever and unexpected weight change.  HENT: Negative.    Eyes: Negative.   Respiratory: Negative.    Cardiovascular: Negative.  Negative for chest pain.  Endocrine: Negative.   Genitourinary: Negative.   Musculoskeletal: Negative.   Skin: Negative.   Allergic/Immunologic: Negative.   Neurological: Negative.   Hematological: Negative.   Psychiatric/Behavioral: Negative.        Objective:   Physical Exam Gen: no distress, normal appearing HEENT: oral mucosa pink and moist, NCAT Cardio: Reg rate Chest: normal effort, normal rate of breathing Abd: soft, non-distended Ext: no edema Psych: pleasant, normal affect Skin: intact Neuro: Alert and oriented x3.  Musculoskeletal: Wears compression garments on both legs.      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  -Discussed benefits of exercise in reducing pain. -continue CBD oil -Discussed Qutenza as an option for neuropathic pain control. Discussed that this is a capsaicin patch, stronger than capsaicin cream. Discussed that it is currently approved for diabetic peripheral neuropathy and post-herpetic neuralgia, but that it has also shown benefit in treating other forms of neuropathy. Provided patient with link to site to learn more about the patch: [employer]fr. Discussed that the patch would be placed in office and benefits usually last 3 months. Discussed that unintended exposure to capsaicin can cause severe irritation of eyes, mucous membranes, respiratory tract, and skin, but that Qutenza is a local treatment and does not have the systemic side effects of other nerve medications. Discussed that there may be pain, itching,  erythema, and decreased sensory function associated with the application of Qutenza. Side effects usually subside within 1 week. A cold pack of analgesic medications can help with these side effects. Blood pressure can also be increased due to pain associated with administration of the patch.  -Discussed following foods that may reduce pain: 1) Ginger (especially studied for arthritis)- reduce leukotriene production to decrease inflammation 2) Blueberries- high in phytonutrients that decrease inflammation 3) Salmon- marine omega-3s reduce joint swelling and pain 4) Pumpkin seeds- reduce inflammation 5) dark chocolate- reduces inflammation 6) turmeric- reduces inflammation 7) tart cherries - reduce pain and stiffness 8) extra virgin olive oil - its compound olecanthal helps to block prostaglandins  9) chili peppers- can be eaten or  applied topically via capsaicin 10) mint- helpful for headache, muscle aches, joint pain, and itching 11) garlic- reduces inflammation  Link to further information on diet for chronic pain: [email or web address]   2) Hot flashes: -continue gabapentin PRN -discussed that she is on such a low dose that she does not need to wean off.

## 2021-08-26 NOTE — Patient Instructions (Signed)
Provided with list of supplements that can help with dyslipidemia: 1) Vitamin B3 500-4,076m in divided doses daily (would recommend starting low as can cause uncomfortable facial flushing if started at too high a dose) 2) Phytosterols 2.15 grams daily 3) Fermented soy 30-50 grams daily 4) EGCG (found in green tea): 500-1005mdaily 5) Omega-3 fatty acids 3000-5,00048maily 6) Flax seed 40 grams daily 7) Monounsaturated fats 20-40 grams daily (olives, olive oil, nuts), also reduces cardiovascular disease 8) Sesame: 40 grams daily 9) Gamma/delta tocotrienols- a family of unsaturated forms of Vitamin E- 200m75mth dinner 10) Pantethine 900mg41mly in divided doses 11) Resveratrol 250mg 50my 12) N Acetyl Cysteine 2000mg d52m in divided doses 13) Curcumin 2000-5000mg in44mided doses daily 14) Pomegranate juice: 8 ounces daily, also helps to lower blood pressure 15) Pomegranate seeds one cup daily, also helps to lower blood pressure 16) Citrus Bergamot 1000mg dai32malso helps with glucose control and weight loss 17) Vitamin C 500mg dail6m) Quercetin 500-1000mg daily11m Glutathione 20) Probiotics 60-100 billion organisms per day 21) Fiber 22) Oats 23) Aged garlic (can eat as food or supplement of 600-900mg per da33m4) Chia seeds 25 grams per day 25) Lycopene- carotenoid found in high concentrations in tomatoes. 26) Alpha linolenic acid 27) Flavonoids and anthocyanins 28) Wogonin- flavanoid that enhances reverse cholesterol transport 29) Coenzyme Q10 30) Pantethine- derivative of Vitamin B5: 300mg three t27m per day or 450mg twice pe74my with or without food 31) Barley and other whole grains 32) Orange juice 33) L- carnitine 34) L- Lysine 35) L- Arginine 36) Almonds 37) Morin 38) Rutin 39) Carnosine 40) Histidine  41) Kaempferol  42) Organosulfur compounds 43) Vitamin E 44) Oleic acid 45) RBO (ferulic acid gammaoryzanol) 46) grape seed extract 47) Red wine 48)  Berberine HCL 500mg daily or 63me per day- more effective and with fewer adverse effects that ezetimibe monotherapy 49) red yeast rice 2400- 4800 mg/day 50) chlorella 51) Licorice    -Discussed following foods that may reduce pain: 1) Ginger (especially studied for arthritis)- reduce leukotriene production to decrease inflammation 2) Blueberries- high in phytonutrients that decrease inflammation 3) Salmon- marine omega-3s reduce joint swelling and pain 4) Pumpkin seeds- reduce inflammation 5) dark chocolate- reduces inflammation 6) turmeric- reduces inflammation 7) tart cherries - reduce pain and stiffness 8) extra virgin olive oil - its compound olecanthal helps to block prostaglandins  9) chili peppers- can be eaten or applied topically via capsaicin 10) mint- helpful for headache, muscle aches, joint pain, and itching 11) garlic- reduces inflammation  Link to further information on diet for chronic pain: [email or web address]

## 2021-09-02 ENCOUNTER — Other Ambulatory Visit: Payer: Self-pay

## 2021-09-02 ENCOUNTER — Encounter
Payer: BC Managed Care – PPO | Attending: Physical Medicine and Rehabilitation | Admitting: Physical Medicine and Rehabilitation

## 2021-09-02 ENCOUNTER — Encounter: Payer: Self-pay | Admitting: Physical Medicine and Rehabilitation

## 2021-09-02 VITALS — BP 129/85 | HR 77 | Temp 99.4°F | Ht 65.0 in | Wt 165.6 lb

## 2021-09-02 DIAGNOSIS — T451X5A Adverse effect of antineoplastic and immunosuppressive drugs, initial encounter: Secondary | ICD-10-CM | POA: Insufficient documentation

## 2021-09-02 DIAGNOSIS — R232 Flushing: Secondary | ICD-10-CM | POA: Diagnosis present

## 2021-09-02 DIAGNOSIS — G620 Drug-induced polyneuropathy: Secondary | ICD-10-CM | POA: Diagnosis present

## 2021-09-02 NOTE — Progress Notes (Signed)
Subjective:    Patient ID: Hannah Woodard, female    DOB: [birth year], 50 y.o.   MRN: [id number]  HPI [name] is a 50 year old woman who presents to establish care for peripheral neuropathy secondary to chemotherapy.   1) Chemotherapy induced peripheral neuropathy.  -she uses CBD oil with menthol and capsaicin and ice socks but these are sometimes too cold for her.  -pain on average is 3/10 -the neuropathy started with breast cancer chemotherapy.  -pain right now is 4/10 -pain is intermittent.  -she was on Taxol and Herceptin.  -she hates that she is on any drugs -she thinks she eats healthy and she takes vitamins -pain is mostly present on dorsum of both feet, as well as balls and heels.  2) High iron level -she was told not to take iron supplement since her level was too high.   3) Hot flashes -she takes gabapentin with these  4) Rash -she easily gets rashes   Pain Inventory Average Pain 3 Pain Right Now 4 My pain is intermittent  LOCATION OF PAIN  feet,toes,leg,neck,thigh,fingers,back,hip,neck  BOWEL Number of stools per week: 7 Oral laxative use fiber powder Type of laxative  Enema or suppository use No  History of colostomy No  Incontinent No   BLADDER Normal  Able to self cath No  Bladder incontinence No  Frequent urination No  Leakage with coughing No  Difficulty starting stream No  Incomplete bladder emptying Yes    Mobility walk without assistance ability to climb steps?  yes do you drive?  yes  Function employed # of hrs/week 37.5  Neuro/Psych numbness tingling  Prior Studies N/a  Physicians involved in your care N/a   Family History  Problem Relation Age of Onset   Ovarian cancer Mother 61   Prostate cancer Father 42       'high gleason' unsure number   Ulcerative colitis Sister    Other Sister        abn ovaian growth, partial hysterectomy   Basal cell carcinoma Sister 48   Uterine cancer Maternal Aunt 44   Stroke  Maternal Aunt 66   Basal cell carcinoma Brother    Skin cancer Paternal Uncle    Skin cancer Maternal Grandmother    Stroke Paternal Grandfather 52   Hydrocephalus Cousin    Social History   Socioeconomic History   Marital status: Married    Spouse name: Not on file   Number of children: Not on file   Years of education: Not on file   Highest education level: Not on file  Occupational History   Occupation: Multimedia programmer  Tobacco Use   Smoking status: Never   Smokeless tobacco: Never  Vaping Use   Vaping Use: Never used  Substance and Sexual Activity   Alcohol use: Not Currently    Comment: rarely    Drug use: No   Sexual activity: Yes    Partners: Male    Birth control/protection: Other-see comments    Comment: [employer]  Other Topics Concern   Not on file  Social History Narrative   Not on file   Social Determinants of Health   Financial Resource Strain: Not on file  Food Insecurity: Not on file  Transportation Needs: Not on file  Physical Activity: Not on file  Stress: Not on file  Social Connections: Not on file   Past Surgical History:  Procedure Laterality Date   PORT-A-CATH REMOVAL Left 2020   right lumpectomy, sentinel node  biopsy, and bilateral mammoplasty  10/2017   [facility]   Past Medical History:  Diagnosis Date   Breast cancer [facility])    Family history of ovarian cancer 09/02/2017   Family history of prostate cancer in father 09/02/2017   Family history of uterine cancer 09/02/2017   Plantar fasciitis    LMP 10/05/2017   Opioid Risk Score:   Fall Risk Score:  `1  Depression screen PHQ 2/9  Depression screen [facility] 2/9 08/26/2021 12/31/2020 01/27/2018  Decreased Interest 0 0 0  Down, Depressed, Hopeless 1 0 0  PHQ - 2 Score 1 0 0  Altered sleeping 2 - -  Tired, decreased energy 2 - -  Change in appetite 0 - -  Feeling bad or failure about yourself  1 - -  Trouble concentrating 0 - -  Moving slowly or fidgety/restless 0 - -   Suicidal thoughts 0 - -  PHQ-9 Score 6 - -  Some recent data might be hidden       Review of Systems  Constitutional:  Positive for chills, fever and unexpected weight change.  HENT: Negative.    Eyes: Negative.   Respiratory: Negative.    Cardiovascular: Negative.  Negative for chest pain.  Endocrine: Negative.   Genitourinary: Negative.   Musculoskeletal: Negative.   Skin: Negative.   Allergic/Immunologic: Negative.   Neurological: Negative.   Hematological: Negative.   Psychiatric/Behavioral: Negative.        Objective:   Physical Exam Gen: no distress, normal appearing HEENT: oral mucosa pink and moist, NCAT Cardio: Reg rate Chest: normal effort, normal rate of breathing Abd: soft, non-distended Ext: no edema Psych: pleasant, normal affect Skin: no open lesions on feet Neuro: Alert and oriented x3.  Musculoskeletal: Wears compression garments on both legs.      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  -Discussed benefits of exercise in reducing pain. -continue CBD oil -Discussed Qutenza as an option for neuropathic pain control. Discussed that this is a capsaicin patch, stronger than capsaicin cream. Discussed that it is currently approved for diabetic peripheral neuropathy and post-herpetic neuralgia, but that it has also shown benefit in treating other forms of neuropathy. Provided patient with link to site to learn more about the patch: [employer].fr. Discussed that the patch would be placed in office and benefits usually last 3 months. Discussed that unintended exposure to capsaicin can cause severe irritation of eyes, mucous membranes, respiratory tract, and skin, but that Qutenza is a local treatment and does not have the systemic side effects of other nerve medications. Discussed that there may be pain, itching, erythema, and decreased  sensory function associated with the application of Qutenza. Side effects usually subside within 1 week. A cold pack of analgesic medications can help with these side effects. Blood pressure can also be increased due to pain associated with administration of the patch.   4 patches of Qutenza was applied to the area of pain. Ice packs were applied during the procedure to ensure patient comfort. Blood pressure was monitored every 15 minutes. The patient tolerated the procedure well. Post-procedure instructions were given and follow-up has been scheduled.   -Discussed following foods that may reduce pain: 1) Ginger (especially studied for arthritis)- reduce leukotriene production to decrease inflammation 2) Blueberries- high in phytonutrients that decrease inflammation 3) Salmon- marine omega-3s reduce joint swelling and pain 4) Pumpkin seeds- reduce inflammation 5) dark chocolate-  reduces inflammation 6) turmeric- reduces inflammation 7) tart cherries - reduce pain and stiffness 8) extra virgin olive oil - its compound olecanthal helps to block prostaglandins  9) chili peppers- can be eaten or applied topically via capsaicin 10) mint- helpful for headache, muscle aches, joint pain, and itching 11) garlic- reduces inflammation  Link to further information on diet for chronic pain: [email or web address]   2) Hot flashes: -continue gabapentin PRN -discussed that she is on such a low dose that she does not need to wean off.

## 2021-09-17 ENCOUNTER — Other Ambulatory Visit: Payer: Self-pay

## 2021-09-17 ENCOUNTER — Inpatient Hospital Stay: Payer: BC Managed Care – PPO | Attending: Oncology

## 2021-09-17 ENCOUNTER — Ambulatory Visit (HOSPITAL_COMMUNITY)
Admission: RE | Admit: 2021-09-17 | Discharge: 2021-09-17 | Disposition: A | Discharge status: Home / Self Care | Payer: BC Managed Care – PPO | Source: Ambulatory Visit | Attending: Gynecologic Oncology | Admitting: Gynecologic Oncology

## 2021-09-17 DIAGNOSIS — Z1502 Genetic susceptibility to malignant neoplasm of ovary: Secondary | ICD-10-CM | POA: Insufficient documentation

## 2021-09-17 DIAGNOSIS — D252 Subserosal leiomyoma of uterus: Secondary | ICD-10-CM | POA: Diagnosis not present

## 2021-09-17 DIAGNOSIS — Z8041 Family history of malignant neoplasm of ovary: Secondary | ICD-10-CM

## 2021-09-18 ENCOUNTER — Telehealth: Payer: Self-pay

## 2021-09-18 LAB — CA 125: Cancer Antigen (CA) 125: 14.7 U/mL (ref 0.0–38.1)

## 2021-09-18 NOTE — Telephone Encounter (Signed)
Spoke with Hannah Woodard this morning regarding her CA 125 and [place] results. Pt informed that the CA 125 value is normal and stable and the [place] is normal. Patient verbalized understanding.

## 2021-11-18 ENCOUNTER — Ambulatory Visit: Payer: BC Managed Care – PPO

## 2021-11-25 ENCOUNTER — Inpatient Hospital Stay: Payer: BC Managed Care – PPO | Attending: Oncology

## 2021-11-25 ENCOUNTER — Other Ambulatory Visit: Payer: Self-pay

## 2021-11-25 VITALS — BP 140/95 | HR 81 | Temp 98.6°F | Resp 18

## 2021-11-25 DIAGNOSIS — C50211 Malignant neoplasm of upper-inner quadrant of right female breast: Secondary | ICD-10-CM | POA: Insufficient documentation

## 2021-11-25 DIAGNOSIS — Z170 Estrogen receptor positive status [ER+]: Secondary | ICD-10-CM | POA: Diagnosis not present

## 2021-11-25 DIAGNOSIS — Z95828 Presence of other vascular implants and grafts: Secondary | ICD-10-CM

## 2021-11-25 DIAGNOSIS — Z5111 Encounter for antineoplastic chemotherapy: Secondary | ICD-10-CM | POA: Insufficient documentation

## 2021-11-25 MED ORDER — GOSERELIN ACETATE 10.8 MG ~~LOC~~ IMPL
10.8000 mg | DRUG_IMPLANT | Freq: Once | SUBCUTANEOUS | Status: AC
  Administered 2021-11-25: 10.8 mg via SUBCUTANEOUS
  Filled 2021-11-25: qty 10.8

## 2021-11-25 NOTE — Patient Instructions (Signed)
Goserelin injection What is this medication? GOSERELIN (GOE se rel in) is similar to a hormone found in the body. It lowers the amount of sex hormones that the body makes. Men will have lower testosterone levels and women will have lower estrogen levels while taking this medicine. In men, this medicine is used to treat prostate cancer; the injection is either given once per month or once every 12 weeks. A once per month injection (only) is used to treat women with endometriosis, dysfunctional uterine bleeding, or advanced breast cancer. This medicine may be used for other purposes; ask your health care provider or pharmacist if you have questions. COMMON BRAND NAME(S): Zoladex, Zoladex 38-Month What should I tell my care team before I take this medication? They need to know if you have any of these conditions: bone problems diabetes heart disease history of irregular heartbeat an unusual or allergic reaction to goserelin, other medicines, foods, dyes, or preservatives pregnant or trying to get pregnant breast-feeding How should I use this medication? This medicine is for injection under the skin. It is given by a health care professional in a hospital or clinic setting. Talk to your pediatrician regarding the use of this medicine in children. Special care may be needed. Overdosage: If you think you have taken too much of this medicine contact a poison control center or emergency room at once. NOTE: This medicine is only for you. Do not share this medicine with others. What if I miss a dose? It is important not to miss your dose. Call your doctor or health care professional if you are unable to keep an appointment. What may interact with this medication? Do not take this medicine with any of the following medications: cisapride dronedarone pimozide thioridazine This medicine may also interact with the following medications: other medicines that prolong the QT interval (an abnormal heart  rhythm) This list may not describe all possible interactions. Give your health care provider a list of all the medicines, herbs, non-prescription drugs, or dietary supplements you use. Also tell them if you smoke, drink alcohol, or use illegal drugs. Some items may interact with your medicine. What should I watch for while using this medication? Visit your doctor or health care provider for regular checks on your progress. Your symptoms may appear to get worse during the first weeks of this therapy. Tell your doctor or healthcare provider if your symptoms do not start to get better or if they get worse after this time. Your bones may get weaker if you take this medicine for a long time. If you smoke or frequently drink alcohol you may increase your risk of bone loss. A family history of osteoporosis, chronic use of drugs for seizures (convulsions), or corticosteroids can also increase your risk of bone loss. Talk to your doctor about how to keep your bones strong. This medicine should stop regular monthly menstruation in women. Tell your doctor if you continue to menstruate. Women should not become pregnant while taking this medicine or for 12 weeks after stopping this medicine. Women should inform their doctor if they wish to become pregnant or think they might be pregnant. There is a potential for serious side effects to an unborn child. Talk to your health care professional or pharmacist for more information. Do not breast-feed an infant while taking this medicine. Men should inform their doctors if they wish to father a child. This medicine may lower sperm counts. Talk to your health care professional or pharmacist for more information. This  medicine may increase blood sugar. Ask your healthcare provider if changes in diet or medicines are needed if you have diabetes. What side effects may I notice from receiving this medication? Side effects that you should report to your doctor or health care  professional as soon as possible: allergic reactions like skin rash, itching or hives, swelling of the face, lips, or tongue bone pain breathing problems changes in vision chest pain feeling faint or lightheaded, falls fever, chills pain, swelling, warmth in the leg pain, tingling, numbness in the hands or feet signs and symptoms of high blood sugar such as being more thirsty or hungry or having to urinate more than normal. You may also feel very tired or have blurry vision signs and symptoms of low blood pressure like dizziness; feeling faint or lightheaded, falls; unusually weak or tired stomach pain swelling of the ankles, feet, hands trouble passing urine or change in the amount of urine unusually high or low blood pressure unusually weak or tired Side effects that usually do not require medical attention (report to your doctor or health care professional if they continue or are bothersome): change in sex drive or performance changes in breast size in both males and females changes in emotions or moods headache hot flashes irritation at site where injected loss of appetite skin problems like acne, dry skin vaginal dryness This list may not describe all possible side effects. Call your doctor for medical advice about side effects. You may report side effects to FDA at 1-800-FDA-1088. Where should I keep my medication? This drug is given in a hospital or clinic and will not be stored at home. NOTE: This sheet is a summary. It may not cover all possible information. If you have questions about this medicine, talk to your doctor, pharmacist, or health care provider.  2022 Elsevier/Gold Standard (2019-01-13 00:00:00)

## 2021-12-09 ENCOUNTER — Encounter: Payer: Self-pay | Admitting: Physical Medicine and Rehabilitation

## 2021-12-09 ENCOUNTER — Encounter
Payer: BC Managed Care – PPO | Attending: Physical Medicine and Rehabilitation | Admitting: Physical Medicine and Rehabilitation

## 2021-12-09 ENCOUNTER — Other Ambulatory Visit: Payer: Self-pay

## 2021-12-09 VITALS — BP 126/85 | HR 71 | Temp 97.9°F | Ht 65.0 in | Wt 170.0 lb

## 2021-12-09 DIAGNOSIS — E663 Overweight: Secondary | ICD-10-CM | POA: Diagnosis not present

## 2021-12-09 DIAGNOSIS — C50211 Malignant neoplasm of upper-inner quadrant of right female breast: Secondary | ICD-10-CM | POA: Insufficient documentation

## 2021-12-09 DIAGNOSIS — Z170 Estrogen receptor positive status [ER+]: Secondary | ICD-10-CM | POA: Diagnosis not present

## 2021-12-09 NOTE — Progress Notes (Signed)
? ?Subjective:  ? ? Patient ID: Hannah Woodard, female    DOB: [birth year], 51 y.o.   MRN: [id number] ? ?HPI ?Hannah Woodard is a 51 year old woman who presents to establish care for peripheral neuropathy secondary to chemotherapy.  ? ?1) Chemotherapy induced peripheral neuropathy.  ?-she uses CBD oil with menthol and capsaicin and ice socks but these are sometimes too cold for her. Has not needed this as often ?-she used oral CBD with chem ?-Initially pain about 8 at worst in the mornings, now down to 5/10.  ?-she feels traditional medicine is still not open to herbal medicine ?-the neuropathy started with breast cancer chemotherapy.  ?-pain right now is 4/10 ?-pain is intermittent.  ?-she was on Taxol and Herceptin.  ?-she hates that she is on any drugs ?-she thinks she eats healthy and she takes vitamins ?-pain is mostly present on dorsum of both feet, as well as balls and heels. ?-charlie horses improved ?-she gets liposomal vitamin C ? ?2) High iron level ?-she was told not to take iron supplement since her level was too high.  ? ?3) Hot flashes ?-she takes gabapentin with these ?-not as bad ? ?4) Rash ?-she easily gets rashes  ? ?Pain Inventory ?Average Pain 3 ?Pain Right Now 4 ?My pain is intermittent ? ?LOCATION OF PAIN  feet,toes,leg,neck,thigh,fingers,back,hip,neck ? ?BOWEL ?Number of stools per week: 7 ?Oral laxative use fiber powder ?Type of laxative  ?Enema or suppository use No  ?History of colostomy No  ?Incontinent No  ? ?BLADDER ?Normal ? ?Able to self cath No  ?Bladder incontinence No  ?Frequent urination No  ?Leakage with coughing No  ?Difficulty starting stream No  ?Incomplete bladder emptying Yes  ? ? ?Mobility ?walk without assistance ?ability to climb steps?  yes ?do you drive?  yes ? ?Function ?employed # of hrs/week 37.5 ? ?Neuro/Psych ?numbness ?tingling ? ?Prior Studies ?N/a ? ?Physicians involved in your care ?N/a ? ? ?Family History  ?Problem Relation Age of Onset  ? Ovarian cancer Mother  5  ? Prostate cancer Father 28  ?     'high gleason' unsure number  ? Ulcerative colitis Sister   ? Other Sister   ?     abn ovaian growth, partial hysterectomy  ? Basal cell carcinoma Sister 31  ? Uterine cancer Maternal Aunt 70  ? Stroke Maternal Aunt 66  ? Basal cell carcinoma Brother   ? Skin cancer Paternal Uncle   ? Skin cancer Maternal Grandmother   ? Stroke Paternal Grandfather 86  ? Hydrocephalus Cousin   ? ?Social History  ? ?Socioeconomic History  ? Marital status: Married  ?  Spouse name: Not on file  ? Number of children: Not on file  ? Years of education: Not on file  ? Highest education level: Not on file  ?Occupational History  ? Occupation: Multimedia programmer  ?Tobacco Use  ? Smoking status: Never  ? Smokeless tobacco: Never  ?Vaping Use  ? Vaping Use: Never used  ?Substance and Sexual Activity  ? Alcohol use: Not Currently  ?  Comment: rarely   ? Drug use: No  ? Sexual activity: Yes  ?  Partners: Male  ?  Birth control/protection: Other-see comments  ?  Comment: chemical menopaus  ?Other Topics Concern  ? Not on file  ?Social History Narrative  ? Not on file  ? ?Social Determinants of Health  ? ?Financial Resource Strain: Not on file  ?Food Insecurity: Not on file  ?Transportation  Needs: Not on file  ?Physical Activity: Not on file  ?Stress: Not on file  ?Social Connections: Not on file  ? ?Past Surgical History:  ?Procedure Laterality Date  ? PORT-A-CATH REMOVAL Left 2020  ? right lumpectomy, sentinel node biopsy, and bilateral mammoplasty  10/2017  ? [place]  ? ?Past Medical History:  ?Diagnosis Date  ? Breast cancer ([place])   ? Family history of ovarian cancer 09/02/2017  ? Family history of prostate cancer in father 09/02/2017  ? Family history of uterine cancer 09/02/2017  ? Plantar fasciitis   ? ?BP 126/85   Pulse 71   Temp 97.9 ?F (36.6 ?C)   Ht 5' 5"  (1.651 m)   Wt 170 lb (77.1 kg)   LMP 10/05/2017   SpO2 96%   BMI 28.29 kg/m?  ? ?Opioid Risk Score:   ?Fall Risk Score:  `1 ? ?Depression  screen PHQ 2/9 ? ?Depression screen [facility] 2/9 12/09/2021 09/02/2021 08/26/2021 12/31/2020 01/27/2018  ?Decreased Interest 0 0 0 0 0  ?Down, Depressed, Hopeless 0 0 1 0 0  ?PHQ - 2 Score 0 0 1 0 0  ?Altered sleeping - - 2 - -  ?Tired, decreased energy - - 2 - -  ?Change in appetite - - 0 - -  ?Feeling bad or failure about yourself  - - 1 - -  ?Trouble concentrating - - 0 - -  ?Moving slowly or fidgety/restless - - 0 - -  ?Suicidal thoughts - - 0 - -  ?PHQ-9 Score - - 6 - -  ?Some recent data might be hidden  ?  ? ? ? ?Review of Systems  ?Constitutional:  Positive for chills, fever and unexpected weight change.  ?HENT: Negative.    ?Eyes: Negative.   ?Respiratory: Negative.    ?Cardiovascular: Negative.  Negative for chest pain.  ?Endocrine: Negative.   ?Genitourinary: Negative.   ?Musculoskeletal: Negative.   ?Skin: Negative.   ?Allergic/Immunologic: Negative.   ?Neurological: Negative.   ?Hematological: Negative.   ?Psychiatric/Behavioral: Negative.    ? ?   ?Objective:  ? Physical Exam ?Gen: no distress, normal appearing, weight 170 lbs, BMI 28.29 ?HEENT: oral mucosa pink and moist, NCAT ?Cardio: Reg rate ?Chest: normal effort, normal rate of breathing ?Abd: soft, non-distended ?Ext: no edema ?Psych: pleasant, normal affect ?Skin: no open lesions on feet ?Neuro: Alert and oriented x3.  ?Musculoskeletal: Wears compression garments on both legs.  ? ?   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  ?-Discussed benefits of exercise in reducing pain. ?-continue CBD oil ?-Discussed Qutenza as an option for neuropathic pain control. Discussed that this is a capsaicin patch, stronger than capsaicin cream. Discussed that it is currently approved for diabetic peripheral neuropathy and post-herpetic neuralgia, but that it has also shown benefit in treating other forms of neuropathy. Provided patient with link to site to  learn more about the patch: CinemaBonus.fr. Discussed that the patch would be placed in office and benefits usually last 3 months. Discussed that unintended exposure to capsaicin can cause severe irritation of eyes, mucous membranes, respiratory tract, and skin, but that Qutenza is a local treatment and does not have the systemic side effects of other nerve medications. Discussed that there may be pain, itching, erythema, and decreased sensory function associated with the application of Qutenza. Side effects usually subside within 1 week. A cold pack of analgesic medications can help with these  side effects. Blood pressure can also be increased due to pain associated with administration of the patch.  ? ?4 patches of Qutenza was applied to the area of pain. Ice packs were applied during the procedure to ensure patient comfort. Blood pressure was monitored every 15 minutes. The patient tolerated the procedure well.  ?Post-procedure instructions were given and follow-up has been scheduled.   ?-Discussed following foods that may reduce pain: ?1) Ginger (especially studied for arthritis)- reduce leukotriene production to decrease inflammation ?2) Blueberries- high in phytonutrients that decrease inflammation ?3) Salmon- marine omega-3s reduce joint swelling and pain ?4) Pumpkin seeds- reduce inflammation ?5) dark chocolate- reduces inflammation ?6) turmeric- reduces inflammation ?7) tart cherries - reduce pain and stiffness ?8) extra virgin olive oil - its compound olecanthal helps to block prostaglandins  ?9) chili peppers- can be eaten or applied topically via capsaicin ?10) mint- helpful for headache, muscle aches, joint pain, and itching ?11) garlic- reduces inflammation ? ?Link to further information on diet for chronic pain: http://www.randall.com/  ? ?2) Hot flashes: ?-continue gabapentin PRN ?-discussed that she is on such a low dose  that she does not need to wean off.  ? ?3) Overweight: ?BMI 28.89.  ? Obesity: ?-Educated that current weight is 170 lbs and current BMI is 28.29 ?-Educated regarding health benefits of weight loss- for pai

## 2021-12-31 ENCOUNTER — Encounter: Payer: Self-pay | Admitting: Adult Health

## 2021-12-31 NOTE — Progress Notes (Deleted)
Complete Physical ? ?Assessment and Plan: ? ?Encounter for Annual Physical Exam with abnormal findings ?Due annually  ?Health Maintenance reviewed ?Healthy lifestyle reviewed and goals set ? ? ?Medication management ?-     CBC with Differential/Platelet ?-     COMPLETE METABOLIC PANEL WITH GFR ?-     Magnesium ? ?Malignant neoplasm of upper-inner quadrant of right breast in female, estrogen receptor positive ([place]) ?Continue follow up with oncology ? ?Neuropathy due to chemotherapeutic drug ([place]) ?monitor ? ?Vitamin D deficiency ?-     VITAMIN D 25 Hydroxy (Vit-D Deficiency, Fractures) ? ?Dense breast tissue ?Continue follow up oncology ? ?Hyperlipidemia ?Mild elevations treated by lifestyle at this time  ?Continue low cholesterol diet and exercise.  ?Check lipid panel.  ?-     Lipid panel ?-     TSH ? ?Screening for diabetes mellitus ?-     Hemoglobin A1c ? ?Screening for hematuria or proteinuria ?-     Urinalysis, Routine w reflex microscopic ? ?Screening, anemia, deficiency, iron ?-     Iron, Total/Total Iron Binding Cap ?-     Vitamin B12 ? ?Vaginal atrophy ?Vitamin E supp, albolene ? ? ?No orders of the defined types were placed in this encounter. ? ? ? ?Discussed med's effects and SE's. Screening labs and tests as requested with regular follow-up as recommended. ? ?Future Appointments  ?Date Time Provider [place]  ?12/31/2021  2:00 PM Liane Comber, NP [facility] None  ?02/17/2022  3:30 PM [facility] [place] [facility] None  ?03/12/2022  9:20 AM Raulkar, Clide Deutscher, MD [facility] CPR  ?05/12/2022  3:30 PM [place] [facility] None  ?06/02/2022  1:30 PM [facility] None  ?06/02/2022  2:00 PM Benay Pike, MD [facility] None  ?06/08/2022  9:20 AM Raulkar, Clide Deutscher, MD [facility] CPR  ?08/04/2022  3:30 PM [place] FLUSH [facility] None  ?09/07/2022  9:20 AM Raulkar, Clide Deutscher, MD [facility] CPR  ? ? ? ?HPI ?51 y.o. female  presents for a complete physical. She has Vitamin D deficiency;  Medication management; Dense breast tissue; Malignant neoplasm of upper-inner quadrant of right breast in female, estrogen receptor positive ([place]); Genetic testing; Port-A-Cath in place; Neuropathy due to chemotherapeutic drug ([place]); Hyperlipidemia; and Overweight (BMI 25.0-29.9) on their problem list. ? ?She is married, no children. 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  ? 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 Griffith Citron. S/p lumpectomy and bil reconstruction at [facility] in 10/2017. She is on arimedex, and she is on zoledex time release q39mfor chemical menopause.   ? ?She has neuropathy secondary to chemotherapy and is working with physical medicine for pain management; using gabapentin, capsaicin patches *** ? ?Due personal breast cancer hx at age 51 family history of ovarian cancer, had genetic testing which showed BRCA negative, but was referred to Dr. EEveritt Amber planning to follow with pelvic [place] 126 [id number] BSO once she is ready *** ? ?BMI is There is no height or weight on file to calculate BMI., she is working on diet and exercise, trying to walk more regularly, had stopped when her neighbor/friend moved away, now walking with sister and elderly dog.   ?Wt Readings from Last 3 Encounters:  ?12/09/21 170 lb (77.1 kg)  ?09/02/21 165 lb 9.6 oz (75.1 kg)  ?08/26/21 164 lb 9.6 oz (74.7 kg)  ? ? ?Her blood pressure  has been controlled at home, today their BP is   ?Last ECHO 09/17/2019 was normal with EF 60-65%.  ? ?Last cholesterol was mildly elevated, working on diet/exericse. Last lipids were:  ?Lab Results  ?Component Value Date  ? CHOL 171 12/31/2020  ? HDL 51 12/31/2020  ? LDLCALC 106 (H) 12/31/2020  ? TRIG 47 12/31/2020  ? CHOLHDL 3.4 12/31/2020  ? ?Last A1C was normal ?Lab Results  ?Component Value Date  ? HGBA1C 4.8 12/31/2020  ? ?Patient is on  Vitamin D supplement.   ?Lab Results  ?Component Value Date  ? VD25OH 100 12/31/2020  ?   ?She is on B12 supplement ?Lab Results  ?Component Value Date  ? [id number] 868 12/31/2020  ? ? ? ? ?Current Medications:  ? ? ? ?Current Outpatient Medications (Respiratory):  ?  Budesonide (RHINOCORT ALLERGY NA), Place into the nose. ?  montelukast (SINGULAIR) 10 MG tablet, TAKE 1 TABLET (10 MG) BY MOUTH EVERY EVENING ? ? ? ?Current Outpatient Medications (Other):  ?  acetic acid-hydrocortisone (VOSOL-HC) OTIC solution, Place 3 drops into the right ear 2 (two) times daily. ?  amoxicillin (AMOXIL) 500 MG capsule, Take 500 mg by mouth every 8 (eight) hours. ?  anastrozole (ARIMIDEX) 1 MG tablet, Take 1 tablet (1 mg total) by mouth daily. ?  ascorbic acid (VITAMIN C) 1000 MG tablet, Take by mouth. ?  b complex vitamins tablet, Take 1 tablet by mouth daily. ?  Bioflavonoid Products (QUERCETIN COMPLEX IMMUNE PO), Take by mouth. W/ bromelain ?  Cholecalciferol (VITAMIN D3) 5000 units TABS, Take 10,000 Units by mouth daily. ?  clobetasol (TEMOVATE) 0.05 % external solution, Apply topically 2 (two) times daily as needed. ?  COLLAGEN PO, Take by mouth daily. Takes beef collagen ?  desoximetasone (TOPICORT) 0.25 % cream, Apply topically daily as needed. ?  fish oil-omega-3 fatty acids 1000 MG capsule, Take 1 g by mouth daily. ?  gabapentin (NEURONTIN) 300 MG capsule, Take 1 capsule (300 mg total) by mouth at bedtime. ?  glucosamine-chondroitin 500-400 MG tablet, Take 3 tablets by mouth daily. ?  Green Tea 200 MG CAPS, Take 1 capsule by mouth daily in the afternoon. ?  ketoconazole (NIZORAL) 2 % cream, APPLY TO AFFECTED AREA EVERY DAY ?  Magnesium Citrate POWD, Take by mouth daily. ?  Melatonin 3 MG CAPS, Take 1 capsule by mouth at bedtime. ?  Menaquinone-7 (VITAMIN K2 PO), Take 150 mcg by mouth daily. ?  Nutritional Supplements (CALCIUM D-GLUCARATE PO), Take by mouth. ?  nystatin ointment (MYCOSTATIN), Apply 1 application topically 2  (two) times daily. ?  ondansetron (ZOFRAN-ODT) 4 MG disintegrating tablet, TAKE 1 TABLET BY MOUTH FOUR TIMES A DAY AS NEEDED FOR NAUSEA ?  Psyllium 48.57 % POWD, Take 10 mLs by mouth once a week. ?  senna (SENOKOT) 8.6 MG tablet, Take 1 tablet by mouth at bedtime as needed for constipation. ?  triamcinolone ointment (KENALOG) 0.1 %, Apply 1 application topically 2 (two) times daily. ?  TURMERIC PO, Take 1 capsule by mouth as directed.  ?  UNABLE TO FIND, Take 500 mg by mouth daily. [place] mushroom ?  UNABLE TO FIND, Take 500 mg by mouth daily. [place] Mushroom ?  UNABLE TO FIND, Take 1 tablet by mouth daily in the afternoon. Lipo-thiamine 96m alpha lipoic acid 7.560msupplement ?  UNABLE TO FIND, Med Name: cbd oil ?  Zinc 50 MG TABS,  ? ?Health Maintenance:  ?Immunization History  ?Administered Date(s) Administered  ?  Hepatitis A 10/02/1997  ? Hepatitis B 12/04/1997  ? Td 10/10/2008, 12/31/2020  ? ?Health Maintenance  ?Topic Date Due  ? COVID-19 Vaccine (1) Never done  ? MAMMOGRAM  05/15/2021  ? PAP SMEAR-Modifier  12/26/2021  ? INFLUENZA VACCINE  04/28/2022  ? Fecal DNA (Cologuard)  01/17/2024  ? TETANUS/TDAP  01/01/2031  ? HPV VACCINES  Aged Out  ? Hepatitis C Screening  Discontinued  ? HIV Screening  Discontinued  ? ?Covid 19: declines  ? ?Pap: 11/2018 never normal pap, negative HPV - plans to schedule with GYN *** ?[facility]: [facility], see in care everywhere, getting annually ? s/p lumpectomy and chemo in 2017, had breast reduction.  ?DEXA: 10/30/2020 on arimedix, T -0.8 ?US Pelvis 09/17/2021 ? ?Colonoscopy: declined colonoscopy  ?Cologuard: 01/16/2021 negative ? ?Last eye: [facility] Dr. Andria Frames, last 2021, goes annually, presbyopia ?Last Dental: Last 2019, encouraged to schedule, looking at Dr. Altamese Shannon  ?Last derm: Dr. ?, last 2021, goes annally  ? ?Medical History:  ?Past Medical History:  ?Diagnosis Date  ? Breast cancer ([place])   ? Family history of ovarian cancer 09/02/2017  ? Family history of prostate cancer in  father 09/02/2017  ? Family history of uterine cancer 09/02/2017  ? Plantar fasciitis   ?  ?Allergies ?Allergies  ?Allergen Reactions  ? Sulfamethoxazole-Trimethoprim Rash  ? Adhesive [Tape]   ? Milk-R

## 2022-02-10 ENCOUNTER — Ambulatory Visit: Payer: BC Managed Care – PPO

## 2022-02-17 ENCOUNTER — Inpatient Hospital Stay: Payer: BC Managed Care – PPO | Attending: Hematology and Oncology

## 2022-02-17 ENCOUNTER — Other Ambulatory Visit: Payer: Self-pay

## 2022-02-17 VITALS — BP 127/86 | HR 71 | Temp 98.7°F | Resp 18

## 2022-02-17 DIAGNOSIS — Z5111 Encounter for antineoplastic chemotherapy: Secondary | ICD-10-CM | POA: Insufficient documentation

## 2022-02-17 DIAGNOSIS — Z170 Estrogen receptor positive status [ER+]: Secondary | ICD-10-CM | POA: Insufficient documentation

## 2022-02-17 DIAGNOSIS — C50211 Malignant neoplasm of upper-inner quadrant of right female breast: Secondary | ICD-10-CM | POA: Diagnosis not present

## 2022-02-17 DIAGNOSIS — Z95828 Presence of other vascular implants and grafts: Secondary | ICD-10-CM

## 2022-02-17 MED ORDER — GOSERELIN ACETATE 10.8 MG ~~LOC~~ IMPL
10.8000 mg | DRUG_IMPLANT | Freq: Once | SUBCUTANEOUS | Status: AC
  Administered 2022-02-17: 10.8 mg via SUBCUTANEOUS
  Filled 2022-02-17: qty 10.8

## 2022-02-17 NOTE — Progress Notes (Signed)
Zoladex order signed.

## 2022-02-17 NOTE — Addendum Note (Signed)
Addended by: Elizebeth Brooking on: 02/17/2022 04:01 PM   Modules accepted: Orders

## 2022-03-11 ENCOUNTER — Telehealth: Payer: Self-pay

## 2022-03-11 ENCOUNTER — Other Ambulatory Visit: Payer: Self-pay | Admitting: Gynecologic Oncology

## 2022-03-11 DIAGNOSIS — Z170 Estrogen receptor positive status [ER+]: Secondary | ICD-10-CM

## 2022-03-11 DIAGNOSIS — Z8041 Family history of malignant neoplasm of ovary: Secondary | ICD-10-CM

## 2022-03-11 NOTE — Telephone Encounter (Signed)
Pt called office to schedule a follow up appointment from seeing Dr. Denman George. She states she usually has an ultrasound every 6 months. Pt is scheduled with Joylene John NP, per Denman George recommendation. Appointment is on 03/17/22 @ 1:00p. Pt is asking is she can get her ultrasound that same day. Pt aware I will notify Melissa and an order can be placed. Pt aware not a guarantee U/S can be done the day she comes in. Pt voices an understanding and will wait on our call.

## 2022-03-12 ENCOUNTER — Encounter: Payer: Self-pay | Admitting: Hematology and Oncology

## 2022-03-12 ENCOUNTER — Encounter
Payer: BC Managed Care – PPO | Attending: Physical Medicine and Rehabilitation | Admitting: Physical Medicine and Rehabilitation

## 2022-03-12 VITALS — BP 107/63 | HR 83 | Ht 65.0 in | Wt 167.0 lb

## 2022-03-12 DIAGNOSIS — E663 Overweight: Secondary | ICD-10-CM | POA: Insufficient documentation

## 2022-03-12 DIAGNOSIS — T451X5A Adverse effect of antineoplastic and immunosuppressive drugs, initial encounter: Secondary | ICD-10-CM | POA: Insufficient documentation

## 2022-03-12 DIAGNOSIS — G620 Drug-induced polyneuropathy: Secondary | ICD-10-CM | POA: Diagnosis present

## 2022-03-12 NOTE — Progress Notes (Signed)
Subjective:    Patient ID: Hannah Woodard, female    DOB: 06-10-1971, 51 y.o.   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   1) Chemotherapy induced peripheral neuropathy.  -she uses CBD oil with menthol and capsaicin and ice socks but these are sometimes too cold for her. Has not needed this as often -she felt most benefit from the first [place] application, she felt less benefit from the last one. She has some burning the day of application but no uncomfortable burning afterward -She thinks her pain may have been a little worse because she has been active.  -she asks whether she can go for a walk today, she got a new puppy -she used oral CBD with chem -Initially pain about 8 at worst in the mornings, now down to 5/10.  -she feels traditional medicine is still not open to herbal medicine -the neuropathy started with breast cancer chemotherapy.  -pain right now is 4/10 -pain is intermittent.  -she was on Taxol and Herceptin.  -she hates that she is on any drugs -she thinks she eats healthy and she takes vitamins -pain is mostly present on dorsum of both feet, as well as balls and heels. -charlie horses improved -she gets liposomal vitamin C  2) High iron level -she was told not to take iron supplement since her level was too high.   3) Hot flashes -she takes gabapentin with these -not as bad  4) Rash -she easily gets rashes   5) Poison Ivy: -she recently got while gardening.   Pain Inventory Average Pain 3 Pain Right Now 4 My pain is intermittent  LOCATION OF PAIN  feet,toes,leg,neck,thigh,fingers,back,hip,neck  BOWEL Number of stools per week: 7 Oral laxative use fiber powder Type of laxative  Enema or suppository use No  History of colostomy No  Incontinent No   BLADDER Normal  Able to self cath No  Bladder incontinence No  Frequent urination No  Leakage with  coughing No  Difficulty starting stream No  Incomplete bladder emptying Yes    Mobility walk without assistance ability to climb steps?  yes do you drive?  yes  Function employed # of hrs/week 37.5  Neuro/Psych numbness tingling  Prior Studies N/a  Physicians involved in your care N/a   Family History  Problem Relation Age of Onset   Ovarian cancer Mother 65   Prostate cancer Father 68       'high gleason' unsure number   Ulcerative colitis Sister    Other Sister        abn ovaian growth, partial hysterectomy   Basal cell carcinoma Sister 86   Uterine cancer Maternal Aunt 55   Stroke Maternal Aunt 66   Basal cell carcinoma Brother    Skin cancer Paternal Uncle    Skin cancer Maternal Grandmother    Stroke Paternal Grandfather 42   Hydrocephalus Cousin    Social History   Socioeconomic History   Marital status: Married    Spouse name: Not on file   Number of children: Not on file   Years of education: Not on file   Highest education level: Not on file  Occupational History   Occupation: Multimedia programmer  Tobacco Use   Smoking status: Never   Smokeless tobacco: Never  Vaping Use   Vaping Use: Never used  Substance and Sexual Activity   Alcohol use: Not Currently    Comment:  rarely    Drug use: No   Sexual activity: Yes    Partners: Male    Birth control/protection: Other-see comments    Comment: chemical menopaus  Other Topics Concern   Not on file  Social History Narrative   Not on file   Social Determinants of Health   Financial Resource Strain: Not on file  Food Insecurity: Not on file  Transportation Needs: Not on file  Physical Activity: Not on file  Stress: Not on file  Social Connections: Not on file   Past Surgical History:  Procedure Laterality Date   PORT-A-CATH REMOVAL Left 2020   right lumpectomy, sentinel node biopsy, and bilateral mammoplasty  10/2017   [facility]   Past Medical History:  Diagnosis Date   Breast cancer  ([place])    Family history of ovarian cancer 09/02/2017   Family history of prostate cancer in father 09/02/2017   Family history of uterine cancer 09/02/2017   Plantar fasciitis    BP 107/63   Pulse 83   Ht 5' 5"  (1.651 m)   Wt 167 lb (75.8 kg)   LMP 10/05/2017   SpO2 94%   BMI 27.79 kg/m   Opioid Risk Score:   Fall Risk Score:  `1  Depression screen PHQ 2/9     12/09/2021    2:05 PM 09/02/2021    3:09 PM 08/26/2021    9:51 AM 12/31/2020    2:42 PM 01/27/2018    9:17 AM  Depression screen PHQ 2/9  Decreased Interest 0 0 0 0 0  Down, Depressed, Hopeless 0 0 1 0 0  PHQ - 2 Score 0 0 1 0 0  Altered sleeping   2    Tired, decreased energy   2    Change in appetite   0    Feeling bad or failure about yourself    1    Trouble concentrating   0    Moving slowly or fidgety/restless   0    Suicidal thoughts   0    PHQ-9 Score   6         Review of Systems  Constitutional:  Positive for chills, fever and unexpected weight change.  HENT: Negative.    Eyes: Negative.   Respiratory: Negative.    Cardiovascular: Negative.  Negative for chest pain.  Endocrine: Negative.   Genitourinary: Negative.   Musculoskeletal: Negative.   Skin: Negative.   Allergic/Immunologic: Negative.   Neurological: Negative.   Hematological: Negative.   Psychiatric/Behavioral: Negative.        Objective:   Physical Exam Gen: no distress, normal appearing, weight 167 lbs, BMI 27.79, BP 107/63 HEENT: oral mucosa pink and moist, NCAT Cardio: Reg rate Chest: normal effort, normal rate of breathing Abd: soft, non-distended Ext: no edema Psych: pleasant, normal affect Skin: no open lesions on feet Neuro: Alert and oriented x3.  Musculoskeletal: Wears compression garments on both legs.      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  -Discussed benefits of exercise in reducing  pain. -continue CBD oil -Discussed Qutenza as an option for neuropathic pain control. Discussed that this is a capsaicin patch, stronger than capsaicin cream. Discussed that it is currently approved for diabetic peripheral neuropathy and post-herpetic neuralgia, but that it has also shown benefit in treating other forms of neuropathy. Provided patient with link to site to learn more about the patch: CinemaBonus.fr.  Discussed that the patch would be placed in office and benefits usually last 3 months. Discussed that unintended exposure to capsaicin can cause severe irritation of eyes, mucous membranes, respiratory tract, and skin, but that Qutenza is a local treatment and does not have the systemic side effects of other nerve medications. Discussed that there may be pain, itching, erythema, and decreased sensory function associated with the application of Qutenza. Side effects usually subside within 1 week. A cold pack of analgesic medications can help with these side effects. Blood pressure can also be increased due to pain associated with administration of the patch.   4 patches of Qutenza was applied to the area of pain. Ice packs were applied during the procedure to ensure patient comfort. Blood pressure was monitored every 15 minutes. The patient tolerated the procedure well. Post-procedure instructions were given and follow-up has been scheduled.   Post-procedure instructions were given and follow-up has been scheduled.    -Discussed following foods that may reduce pain: 1) Ginger (especially studied for arthritis)- reduce leukotriene production to decrease inflammation 2) Blueberries- high in phytonutrients that decrease inflammation 3) Salmon- marine omega-3s reduce joint swelling and pain 4) Pumpkin seeds- reduce inflammation 5) dark chocolate- reduces inflammation 6) turmeric- reduces inflammation 7) tart cherries - reduce pain and stiffness 8) extra virgin olive oil - its compound  olecanthal helps to block prostaglandins  9) chili peppers- can be eaten or applied topically via capsaicin 10) mint- helpful for headache, muscle aches, joint pain, and itching 11) garlic- reduces inflammation  Link to further information on diet for chronic pain: http://www.randall.com/   2) Hot flashes: -continue gabapentin PRN -discussed that she is on such a low dose that she does not need to wean off.   3) Overweight: -Educated that current weight is 167 lbs and current BMI is 27.29, lost 3 lbs since last visit! -discussed that decreasing weight can help decrease weight on joints, and thus lessen pain.  -Educated regarding health benefits of weight loss- for pain, general health, chronic disease prevention, immune health, mental health.  -Will monitor weight every visit.  -Consider [place] tea daily.  -Discussed the benefits of intermittent fasting. -Discussed foods that can assist in weight loss: 1) leafy greens- high in fiber and nutrients 2) dark chocolate- improves metabolism (if prefer sweetened, best to sweeten with honey instead of sugar).  3) cruciferous vegetables- high in fiber and protein 4) full fat yogurt: high in healthy fat, protein, calcium, and probiotics 5) apples- high in a variety of phytochemicals 6) nuts- high in fiber and protein that increase feelings of fullness 7) grapefruit: rich in nutrients, antioxidants, and fiber (not to be taken with anticoagulation) 8) beans- high in protein and fiber 9) salmon- has high quality protein and healthy fats 10) green tea- rich in polyphenols 11) eggs- rich in choline and vitamin D 12) tuna- high protein, boosts metabolism 13) avocado- decreases visceral abdominal fat 14) chicken (pasture raised): high in protein and iron 15) blueberries- reduce abdominal fat and cholesterol 16) whole grains- decreases calories retained during digestion, speeds  metabolism 17) chia seeds- curb appetite 18) chilies- increases fat metabolism  -Discussed supplements that can be used:  1) Metatrim 43m BID 30 minutes before breakfast and dinner  2) Sphaeranthus indicus and Garcinia mangostana (combinations of these and #1 can be found in capsicum and zychrome  3) green coffee bean extract 4069mtwice per day or Irvingia (african mango) 150 to 30043mwice per day.

## 2022-03-12 NOTE — Telephone Encounter (Signed)
Pt is scheduled for CA125 on 03/19/22 @ 1:30pm. Pelvic US is scheduled at 2:30pm. Pt understands to arrive at 2:00pm with a full bladder at [facility] registration.  Pt voices an understanding of instructions.   Pt is also scheduled for a follow up on 03/20/22 at 1:00pm with Joylene John NP. Pt is in agreement with the above appointments and was thankful for the call.

## 2022-03-17 ENCOUNTER — Ambulatory Visit: Payer: BC Managed Care – PPO | Admitting: Gynecologic Oncology

## 2022-03-19 ENCOUNTER — Inpatient Hospital Stay: Payer: BC Managed Care – PPO | Attending: Hematology and Oncology

## 2022-03-19 ENCOUNTER — Other Ambulatory Visit: Payer: Self-pay

## 2022-03-19 ENCOUNTER — Ambulatory Visit (HOSPITAL_COMMUNITY)
Admission: RE | Admit: 2022-03-19 | Discharge: 2022-03-19 | Disposition: A | Discharge status: Home / Self Care | Payer: BC Managed Care – PPO | Source: Ambulatory Visit | Attending: Gynecologic Oncology | Admitting: Gynecologic Oncology

## 2022-03-19 DIAGNOSIS — Z8041 Family history of malignant neoplasm of ovary: Secondary | ICD-10-CM | POA: Diagnosis present

## 2022-03-19 DIAGNOSIS — Z170 Estrogen receptor positive status [ER+]: Secondary | ICD-10-CM | POA: Insufficient documentation

## 2022-03-19 DIAGNOSIS — C50211 Malignant neoplasm of upper-inner quadrant of right female breast: Secondary | ICD-10-CM | POA: Diagnosis present

## 2022-03-19 DIAGNOSIS — D259 Leiomyoma of uterus, unspecified: Secondary | ICD-10-CM | POA: Diagnosis not present

## 2022-03-19 DIAGNOSIS — Z1502 Genetic susceptibility to malignant neoplasm of ovary: Secondary | ICD-10-CM | POA: Insufficient documentation

## 2022-03-19 DIAGNOSIS — Z79811 Long term (current) use of aromatase inhibitors: Secondary | ICD-10-CM | POA: Insufficient documentation

## 2022-03-20 ENCOUNTER — Encounter: Payer: Self-pay | Admitting: Gynecologic Oncology

## 2022-03-20 ENCOUNTER — Inpatient Hospital Stay: Payer: BC Managed Care – PPO | Admitting: Gynecologic Oncology

## 2022-03-20 VITALS — BP 123/89 | HR 81 | Temp 98.4°F | Ht 65.0 in | Wt 167.0 lb

## 2022-03-20 DIAGNOSIS — Z79811 Long term (current) use of aromatase inhibitors: Secondary | ICD-10-CM | POA: Diagnosis not present

## 2022-03-20 DIAGNOSIS — Z1502 Genetic susceptibility to malignant neoplasm of ovary: Secondary | ICD-10-CM | POA: Diagnosis present

## 2022-03-20 DIAGNOSIS — Z8041 Family history of malignant neoplasm of ovary: Secondary | ICD-10-CM | POA: Diagnosis not present

## 2022-03-20 DIAGNOSIS — C50919 Malignant neoplasm of unspecified site of unspecified female breast: Secondary | ICD-10-CM | POA: Diagnosis not present

## 2022-03-20 DIAGNOSIS — Z170 Estrogen receptor positive status [ER+]: Secondary | ICD-10-CM

## 2022-03-20 DIAGNOSIS — C50211 Malignant neoplasm of upper-inner quadrant of right female breast: Secondary | ICD-10-CM

## 2022-03-20 LAB — CA 125: Cancer Antigen (CA) 125: 16.7 U/mL (ref 0.0–38.1)

## 2022-03-23 ENCOUNTER — Telehealth: Payer: Self-pay

## 2022-04-08 ENCOUNTER — Encounter: Payer: Self-pay | Admitting: Gynecologic Oncology

## 2022-04-14 ENCOUNTER — Other Ambulatory Visit: Payer: Self-pay

## 2022-04-14 ENCOUNTER — Other Ambulatory Visit (HOSPITAL_COMMUNITY)
Admission: RE | Admit: 2022-04-14 | Discharge: 2022-04-14 | Disposition: A | Discharge status: Home / Self Care | Payer: BC Managed Care – PPO | Source: Ambulatory Visit | Attending: Gynecologic Oncology | Admitting: Gynecologic Oncology

## 2022-04-14 ENCOUNTER — Inpatient Hospital Stay: Payer: BC Managed Care – PPO | Attending: Hematology and Oncology | Admitting: Gynecologic Oncology

## 2022-04-14 VITALS — BP 135/80 | HR 70 | Temp 97.5°F | Resp 18 | Ht 65.0 in | Wt 164.3 lb

## 2022-04-14 DIAGNOSIS — R350 Frequency of micturition: Secondary | ICD-10-CM

## 2022-04-14 DIAGNOSIS — T386X5A Adverse effect of antigonadotrophins, antiestrogens, antiandrogens, not elsewhere classified, initial encounter: Secondary | ICD-10-CM

## 2022-04-14 DIAGNOSIS — Z170 Estrogen receptor positive status [ER+]: Secondary | ICD-10-CM | POA: Diagnosis present

## 2022-04-14 DIAGNOSIS — N941 Unspecified dyspareunia: Secondary | ICD-10-CM

## 2022-04-14 DIAGNOSIS — Z124 Encounter for screening for malignant neoplasm of cervix: Secondary | ICD-10-CM | POA: Diagnosis present

## 2022-04-14 DIAGNOSIS — C50211 Malignant neoplasm of upper-inner quadrant of right female breast: Secondary | ICD-10-CM | POA: Insufficient documentation

## 2022-04-14 DIAGNOSIS — N952 Postmenopausal atrophic vaginitis: Secondary | ICD-10-CM

## 2022-04-14 NOTE — Patient Instructions (Signed)
We will contact you with the results of your pap smear from today. You may have some spotting after today's exam which can be normal.   Please look over the information about pelvic floor physical therapy and you can call or send a message if you would like for [place] to place a referral. I think this will help with the urinary symptoms you are having along with the vaginal symptoms.  Please call for any needs or questions at [phone].

## 2022-04-14 NOTE — Progress Notes (Signed)
Gynecologic Oncology  Patient presents to the office today for a pap smear. Her last pap smear was on 12/27/2018 and was negative with HPV high risk not detected. She has been doing well since her recent visit. She has continued using the dilator and feels the dilator comes out easier with less resistance. She continues to have urinary frequency, throughout nighttime as well interfering with sleep. No hematuria or dysuria. All questions answered.  Exam: Alert, oriented, in no acute distress. On vaginal exam, previous noted left vaginal apex abrasion, mild agglutination improved. Thin prep pap smear obtained without difficulty.  Given symptoms of urinary frequency, vaginal atrophy related to antiestrogen therapy, and dyspareunia, we discussed pelvic floor physical therapy as an option. After discussion, she would like a referral to be placed. She will be contacted with the results of her pap smear when available. She is advised to call for any needs or concerns.

## 2022-04-16 LAB — CYTOLOGY - PAP
Comment: NEGATIVE
Diagnosis: NEGATIVE
High risk HPV: NEGATIVE

## 2022-04-17 ENCOUNTER — Telehealth: Payer: Self-pay

## 2022-04-17 NOTE — Telephone Encounter (Signed)
Pt aware of normal HPV high risk negative results of pap smear from 04/14/22

## 2022-04-28 ENCOUNTER — Other Ambulatory Visit: Payer: Self-pay

## 2022-04-28 ENCOUNTER — Ambulatory Visit: Payer: BC Managed Care – PPO | Attending: Gynecologic Oncology | Admitting: Physical Therapy

## 2022-04-28 DIAGNOSIS — N941 Unspecified dyspareunia: Secondary | ICD-10-CM | POA: Diagnosis not present

## 2022-04-28 DIAGNOSIS — T386X5A Adverse effect of antigonadotrophins, antiestrogens, antiandrogens, not elsewhere classified, initial encounter: Secondary | ICD-10-CM | POA: Diagnosis not present

## 2022-04-28 DIAGNOSIS — M62838 Other muscle spasm: Secondary | ICD-10-CM | POA: Insufficient documentation

## 2022-04-28 DIAGNOSIS — R350 Frequency of micturition: Secondary | ICD-10-CM | POA: Diagnosis not present

## 2022-04-28 DIAGNOSIS — R293 Abnormal posture: Secondary | ICD-10-CM | POA: Insufficient documentation

## 2022-04-28 DIAGNOSIS — R279 Unspecified lack of coordination: Secondary | ICD-10-CM | POA: Diagnosis present

## 2022-04-28 DIAGNOSIS — M6281 Muscle weakness (generalized): Secondary | ICD-10-CM | POA: Diagnosis present

## 2022-04-28 NOTE — Therapy (Signed)
OUTPATIENT PHYSICAL THERAPY FEMALE PELVIC EVALUATION   Patient Name: Hannah Woodard MRN: [id number] DOB:[birth year], 51 y.o., female Today's Date: 04/28/2022   PT End of Session - 04/28/22 1036     Visit Number 1    Date for PT Re-Evaluation 07/29/22    Authorization Type BCBS    PT Start Time 1015    PT Stop Time 1055    PT Time Calculation (min) 40 min    Activity Tolerance Patient tolerated treatment well    Behavior During Therapy [facility] for tasks assessed/performed             Past Medical History:  Diagnosis Date   Breast cancer ([place])    Family history of ovarian cancer 09/02/2017   Family history of prostate cancer in father 09/02/2017   Family history of uterine cancer 09/02/2017   Plantar fasciitis    Past Surgical History:  Procedure Laterality Date   PORT-A-CATH REMOVAL Left 2020   right lumpectomy, sentinel node biopsy, and bilateral mammoplasty  10/2017   [facility]   Patient Active Problem List   Diagnosis Date Noted   Overweight (BMI 25.0-29.9) 12/31/2020   Hyperlipidemia 12/30/2020   Neuropathy due to chemotherapeutic drug ([employer]) 12/31/2017   Port-A-Cath in place 11/12/2017   Genetic testing 09/15/2017   Malignant neoplasm of upper-inner quadrant of right breast in female, estrogen receptor positive ([place]) 08/27/2017   Vitamin D deficiency 12/09/2015   Medication management 12/09/2015   Dense breast tissue 12/09/2015    PCP: Hayden Rasmussen, MD  REFERRING PROVIDER: Dorothyann Gibbs, NP  REFERRING DIAG:  R35.0 (ICD-10-CM) - Urinary frequency N94.10 (ICD-10-CM) - Dyspareunia in female T41.6X5A (ICD-10-CM) - Adverse effect of antigonadotrophins, antiestrogens, antiandrogens, not elsewhere classified, initial encounter  THERAPY DIAG:  Muscle weakness (generalized)  Unspecified lack of coordination  Other muscle spasm  Abnormal posture  Rationale for Evaluation and Treatment Rehabilitation  ONSET DATE: years  SUBJECTIVE:                                                                                                                                                                                            SUBJECTIVE STATEMENT: Pt reports she has pain with intercourse and this is bothersome as she still has desire to have intercourse. Pt reports dryness, trouble voiding bladder with strong urge,intermittent leakage does have vaginal dilators status post radiation and has been using these and feels these help but she had not been able to use these as regularly.  Fluid intake: Yes: 60 oz water, tea sometimes but not regular       PAIN:  Are you having pain? Yes  NPRS scale: 4/10 Pain location:  bil hips feel tight and have pain with this  Pain type: tight Pain description: intermittent   Aggravating factors: walking, sitting for long time, tight pants, intercourse Relieving factors: rest  PRECAUTIONS: None  WEIGHT BEARING RESTRICTIONS No  FALLS:  Has patient fallen in last 6 months? 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  PLOF: Independent  PATIENT GOALS to have less pain, more regular voids  PERTINENT HISTORY:  personal history of triple positive breast cancer at age 20 and family history of ovarian cancer, BRCA negative on genetic testing Vaginal atrophy Malignant neoplasm of upper-inner quadrant of right breast in female, estrogen receptor positive   Sexual abuse: No  BOWEL MOVEMENT Pain with bowel movement: No Type of bowel movement:Type (Bristol Stool Scale) 2-3, Frequency daily, and Strain Yes but this has improved since starting using step stool in bathroom Fully empty rectum: No Leakage: No Pads: No Fiber supplement: Yes:    URINATION Pain with urination: No sometimes, rarely will have a burning Fully empty bladder: No Stream: Strong and Weak Urgency: Yes: consistently Frequency: 2 hours, usually gets up  at night 1x Leakage: Coughing and Sneezing Pads: No  INTERCOURSE Pain with intercourse: Initial Penetration, During Penetration, After Intercourse, and Pain Interrupts Intercourse Ability to have vaginal penetration:  Yes:   Climax: not painful but pain prevents climax sometimes Marinoff Scale: 2/3  PREGNANCY Vaginal deliveries 0 Tearing No C-section deliveries 0 Currently pregnant No  PROLAPSE None    OBJECTIVE:   DIAGNOSTIC FINDINGS:   COGNITION:  Overall cognitive status: Within functional limits for tasks assessed     SENSATION:  Light touch: Appears intact   Proprioception: Appears intact  MUSCLE LENGTH: Bil hamstring and adductor limited by 25%                POSTURE: rounded shoulders and forward head    LUMBARAROM/PROM  A/PROM A/PROM  eval  Flexion Limited by 25%  Extension WFL  Right lateral flexion Limited by 25%  Left lateral flexion Limited by 25%  Right rotation WFL  Left rotation WFL   (Blank rows = not tested)  LOWER EXTREMITY ROM:  Bil WFL LOWER EXTREMITY MMT:   Bil hips 4/5 grossly, knees and ankles 5/5   PALPATION:   General  no TTP, mild fascial restrictions noted in lower abdominal quadrants and over bladder                External Perineal Exam no TTP, dryness noted externally                             Internal Pelvic Floor no TTP however increased tension noted throughout  Patient confirms identification and approves PT to assess internal pelvic floor and treatment Yes  PELVIC MMT:   MMT eval  Vaginal 1/5 with max cues and quick release; without cues 0/5; 1s; 1 rep  Internal Anal Sphincter   External Anal Sphincter   Puborectalis   Diastasis Recti   (Blank rows = not tested)        TONE: Mildly increased  PROLAPSE: Not seen in hooklying   TODAY'S TREATMENT  EVAL Examination completed, findings reviewed, pt educated on POC, HEP, and feminine moisturizers. Pt motivated to participate in PT and agreeable to  attempt recommendations.     PATIENT EDUCATION:  Education details: [id number] Person  educated: Patient Education method: Explanation, Demonstration, Tactile cues, Verbal cues, and Handouts Education comprehension: verbalized understanding and returned demonstration   HOME EXERCISE PROGRAM: [id number]  ASSESSMENT:  CLINICAL IMPRESSION: Patient is a 51 y.o. female  who was seen today for physical therapy evaluation and treatment for urinary frequency, pain with intercourse, vaginal dryness, difficulty voiding bladder, pain at vulva with walking. Pt reports she also has urinary leakage with sneezing or coughing intermittently but not always. Pt found to have decreased flexibility in spine and hips, decreased strength in bil hips and core, fascial restrictions in lower abdomen. Pt consented to internal vaginal assessment this date and found to have decreased strength, coordination, and endurance.  Pt required max verbal cues and visualization techniques to attempt contraction at pelvic floor however needed quick release for one rep of activation for 1 second. Pt educated on ways to complete with self insertion of finger at home to better feel when contracting or spouse to assist pt agreed to attempt this as she has a hard time feeling when contracting and when not. Pt given and PT reviewed HEP and pt denied questions. Pt also educated on moisturizers for home. Pt asked about estrogen cream, PT educated to ask MD about any use of estrogen based medication as PT cannot give recommendations on this.    OBJECTIVE IMPAIRMENTS decreased coordination, decreased endurance, decreased mobility, decreased strength, increased fascial restrictions, increased muscle spasms, impaired flexibility, improper body mechanics, postural dysfunction, and pain.   ACTIVITY LIMITATIONS continence, locomotion level, and intercourse  PARTICIPATION LIMITATIONS: interpersonal relationship and community activity  PERSONAL  FACTORS Past/current experiences, Time since onset of injury/illness/exacerbation, and 1 comorbidity: medical history  are also affecting patient's functional outcome.   REHAB POTENTIAL: Good  CLINICAL DECISION MAKING: Stable/uncomplicated  EVALUATION COMPLEXITY: Low   GOALS: Goals reviewed with patient? Yes  SHORT TERM GOALS: Target date: 05/26/2022  Pt to be I with HEP.  Baseline: Goal status: INITIAL  2.  Pt to be I with use of feminine moisturizers and lubricants as needed for improved vaginal health and decreased pain with penetration.  Baseline:  Goal status: INITIAL  3.  Pt to demonstrated improved coordination with pelvic floor and breathing mechanics with body weight squats without leakage or pain for improved pelvic stability.  Baseline:  Goal status: INITIAL   LONG TERM GOALS: Target date:  07/29/22    Pt to be I with advanced HEP.  Baseline:  Goal status: INITIAL  2.  Pt to demonstrate at least 5/5 bil hip strength for improved pelvic stability and functional squats without leakage.  Baseline:  Goal status: INITIAL  3. Pt to demonstrate at least 3/5 pelvic floor strength for improved pelvic stability and decreased strain at pelvic floor/ decrease leakage.  Baseline:  Goal status: INITIAL  4.  Pt to report improved time between bladder voids to at least 2.5 hours for improved QOL with decreased urinary frequency.   Baseline:  Goal status: INITIAL  5.  Pt to report no more than 2/10 pain with vaginal penetration for improved QOL and decreased pain with medical procedures. Baseline:  Goal status: INITIAL    PLAN: PT FREQUENCY: 1x/week  PT DURATION:  10 sessions  PLANNED INTERVENTIONS: Therapeutic exercises, Therapeutic activity, Neuromuscular re-education, Patient/Family education, Self Care, Joint mobilization, Aquatic Therapy, Dry Needling, Spinal mobilization, Cryotherapy, Moist heat, scar mobilization, Taping, Biofeedback, and Manual therapy  PLAN  FOR NEXT SESSION: go over HEP as needed, beginner core/hip strengthening with cues for pelvic floor contraction if  able, internal if needed for improved coordination of pelvic floor    Stacy Gardner, PT, DPT 04/29/2311:13 PM

## 2022-04-28 NOTE — Patient Instructions (Signed)

## 2022-05-04 ENCOUNTER — Ambulatory Visit: Payer: BC Managed Care – PPO | Admitting: Physical Therapy

## 2022-05-04 DIAGNOSIS — M6281 Muscle weakness (generalized): Secondary | ICD-10-CM | POA: Diagnosis not present

## 2022-05-04 DIAGNOSIS — R293 Abnormal posture: Secondary | ICD-10-CM

## 2022-05-04 DIAGNOSIS — R279 Unspecified lack of coordination: Secondary | ICD-10-CM

## 2022-05-04 NOTE — Therapy (Signed)
OUTPATIENT PHYSICAL THERAPY FEMALE PELVIC EVALUATION   Patient Name: Hannah Woodard MRN: [id number] DOB:02/11/1971, 51 y.o., female Today's Date: 05/04/2022   PT End of Session - 05/04/22 1147     Visit Number 2    Date for PT Re-Evaluation 07/29/22    Authorization Type BCBS    PT Start Time 3235    PT Stop Time 59    PT Time Calculation (min) 45 min    Activity Tolerance Patient tolerated treatment well    Behavior During Therapy [facility] for tasks assessed/performed             Past Medical History:  Diagnosis Date   Breast cancer ([place])    Family history of ovarian cancer 09/02/2017   Family history of prostate cancer in father 09/02/2017   Family history of uterine cancer 09/02/2017   Plantar fasciitis    Past Surgical History:  Procedure Laterality Date   PORT-A-CATH REMOVAL Left 2020   right lumpectomy, sentinel node biopsy, and bilateral mammoplasty  10/2017   [facility]   Patient Active Problem List   Diagnosis Date Noted   Overweight (BMI 25.0-29.9) 12/31/2020   Hyperlipidemia 12/30/2020   Neuropathy due to chemotherapeutic drug ([place]) 12/31/2017   Port-A-Cath in place 11/12/2017   Genetic testing 09/15/2017   Malignant neoplasm of upper-inner quadrant of right breast in female, estrogen receptor positive ([place]) 08/27/2017   Vitamin D deficiency 12/09/2015   Medication management 12/09/2015   Dense breast tissue 12/09/2015    PCP: Hayden Rasmussen, MD  REFERRING PROVIDER: Dorothyann Gibbs, NP  REFERRING DIAG:  R35.0 (ICD-10-CM) - Urinary frequency N94.10 (ICD-10-CM) - Dyspareunia in female T62.6X5A (ICD-10-CM) - Adverse effect of antigonadotrophins, antiestrogens, antiandrogens, not elsewhere classified, initial encounter  THERAPY DIAG:  Muscle weakness (generalized)  Unspecified lack of coordination  Abnormal posture  Rationale for Evaluation and Treatment Rehabilitation  ONSET DATE: years  SUBJECTIVE:                                                                                                                                                                                            SUBJECTIVE STATEMENT: Pt reports she has been doing HEP and has tried moisturizer and reports she hasn't noticed a huge difference yet. But has not attempted intercourse.   Fluid intake: Yes: 60 oz water, tea sometimes but not regular       PAIN:  Are you having pain? Yes NPRS scale: 4/10 Pain location:  bil hips feel tight and have pain with this  Pain type: tight Pain description: intermittent   Aggravating factors: walking, sitting for long time, tight pants, intercourse  Relieving factors: rest  PRECAUTIONS: None  WEIGHT BEARING RESTRICTIONS No  FALLS:  Has patient fallen in last 6 months? 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  PLOF: Independent  PATIENT GOALS to have less pain, more regular voids  PERTINENT HISTORY:  personal history of triple positive breast cancer at age 87 and family history of ovarian cancer, BRCA negative on genetic testing Vaginal atrophy Malignant neoplasm of upper-inner quadrant of right breast in female, estrogen receptor positive   Sexual abuse: No  BOWEL MOVEMENT Pain with bowel movement: No Type of bowel movement:Type (Bristol Stool Scale) 2-3, Frequency daily, and Strain Yes but this has improved since starting using step stool in bathroom Fully empty rectum: No Leakage: No Pads: No Fiber supplement: Yes:    URINATION Pain with urination: No sometimes, rarely will have a burning Fully empty bladder: No Stream: Strong and Weak Urgency: Yes: consistently Frequency: 2 hours sometimes sooner, usually gets up at night 1x Leakage: Coughing and Sneezing Pads: No  INTERCOURSE Pain with intercourse: Initial Penetration, During Penetration, After Intercourse, and Pain Interrupts  Intercourse Ability to have vaginal penetration:  Yes:   Climax: not painful but pain prevents climax sometimes Marinoff Scale: 2/3  PREGNANCY Vaginal deliveries 0 Tearing No C-section deliveries 0 Currently pregnant No  PROLAPSE None    OBJECTIVE:   DIAGNOSTIC FINDINGS:   COGNITION:  Overall cognitive status: Within functional limits for tasks assessed     SENSATION:  Light touch: Appears intact   Proprioception: Appears intact  MUSCLE LENGTH: Bil hamstring and adductor limited by 25%                POSTURE: rounded shoulders and forward head    LUMBARAROM/PROM  A/PROM A/PROM  eval  Flexion Limited by 25%  Extension WFL  Right lateral flexion Limited by 25%  Left lateral flexion Limited by 25%  Right rotation WFL  Left rotation WFL   (Blank rows = not tested)  LOWER EXTREMITY ROM:  Bil WFL LOWER EXTREMITY MMT:   Bil hips 4/5 grossly, knees and ankles 5/5   PALPATION:   General  no TTP, mild fascial restrictions noted in lower abdominal quadrants and over bladder                External Perineal Exam no TTP, dryness noted externally                             Internal Pelvic Floor no TTP however increased tension noted throughout  Patient confirms identification and approves PT to assess internal pelvic floor and treatment Yes  PELVIC MMT:   MMT eval  Vaginal 1/5 with max cues and quick release; without cues 0/5; 1s; 1 rep  Internal Anal Sphincter   External Anal Sphincter   Puborectalis   Diastasis Recti   (Blank rows = not tested)        TONE: Mildly increased  PROLAPSE: Not seen in hooklying   TODAY'S TREATMENT   05/04/22: Pt had several questions about moisturizers and estrogens role ine vaginal tissue. Pt educated on non-hormonal moisturizers and instructed pt to ask oncologist about any estrogen based medications due to her cancer history. Pt understands this and understands PT cannot make any medication based recommendations,  these should be asked to MD.  Pt reports she still feels like she has a hard time understanding if  she is contracting at pelvic floor or not and asked for internal feedback today from PT Pt consented to internal vaginal pelvic floor treatment with NMRE focus.  3x10 pelvic floor contractions with rest between sets due to fatigue. Pt initially had 0/5 contraction then 1/5 but with downward pressure and abdominal bulge with valsalva. Pt instructed on breathing mechanics, then once pt grasped this progressed to coordinated breathing with pelvic floor contraction. Pt consistently demonstrated 1/5 with this and reports she could feel a difference in not pushing down but still does not feel movement yet.  Quick release NMRE technique employed to improve muscle activation at pelvic floor superficial muscle layer with slightly increased strength but not 2/5 yet. Pt agreeable to attempt these at home with internal feedback of either her own finger or spouse to help if pt consents to this.   EVAL Examination completed, findings reviewed, pt educated on POC, HEP, and feminine moisturizers. Pt motivated to participate in PT and agreeable to attempt recommendations.     PATIENT EDUCATION:  Education details: [id number] Person educated: Patient Education method: Consulting civil engineer, Demonstration, Corporate treasurer cues, Verbal cues, and Handouts Education comprehension: verbalized understanding and returned demonstration   HOME EXERCISE PROGRAM: [id number]  ASSESSMENT:  CLINICAL IMPRESSION: Patient reports she hasn't noticed a change in pelvic floor strength or symptoms yet but reports she has been using Good Clean Love moisturizer the gynecologist gave her sample of and reports this has been helpful in dryness, PT also educated pt on this and reports it has been helpful with less pain but has not had intercourse yet. Pt session focused on this education and internal pelvic floor treatment with NMRE needed for muscle retraining  and activation. Pt continues to demonstrate poor muscle activation and benefits from moderate VC and quick release to improve awareness and contraction at superficial layer only with 1/5 strength. Pt benefited from internal feedback to improve carry over of mechanics and decrease strain at pelvic floor. Pt tolerated well without pain and reports she understands how to do this more for home. Pt would benefit from additional PT to further address deficits.     OBJECTIVE IMPAIRMENTS decreased coordination, decreased endurance, decreased mobility, decreased strength, increased fascial restrictions, increased muscle spasms, impaired flexibility, improper body mechanics, postural dysfunction, and pain.   ACTIVITY LIMITATIONS continence, locomotion level, and intercourse  PARTICIPATION LIMITATIONS: interpersonal relationship and community activity  PERSONAL FACTORS Past/current experiences, Time since onset of injury/illness/exacerbation, and 1 comorbidity: medical history  are also affecting patient's functional outcome.   REHAB POTENTIAL: Good  CLINICAL DECISION MAKING: Stable/uncomplicated  EVALUATION COMPLEXITY: Low   GOALS: Goals reviewed with patient? Yes  SHORT TERM GOALS: Target date: 05/26/2022  Pt to be I with HEP.  Baseline: Goal status: INITIAL  2.  Pt to be I with use of feminine moisturizers and lubricants as needed for improved vaginal health and decreased pain with penetration.  Baseline:  Goal status: INITIAL  3.  Pt to demonstrated improved coordination with pelvic floor and breathing mechanics with body weight squats without leakage or pain for improved pelvic stability.  Baseline:  Goal status: INITIAL   LONG TERM GOALS: Target date:  07/29/22    Pt to be I with advanced HEP.  Baseline:  Goal status: INITIAL  2.  Pt to demonstrate at least 5/5 bil hip strength for improved pelvic stability and functional squats without leakage.  Baseline:  Goal status:  INITIAL  3. Pt to demonstrate at least 3/5 pelvic floor strength for improved pelvic  stability and decreased strain at pelvic floor/ decrease leakage.  Baseline:  Goal status: INITIAL  4.  Pt to report improved time between bladder voids to at least 2.5 hours for improved QOL with decreased urinary frequency.   Baseline:  Goal status: INITIAL  5.  Pt to report no more than 2/10 pain with vaginal penetration for improved QOL and decreased pain with medical procedures. Baseline:  Goal status: INITIAL    PLAN: PT FREQUENCY: 1x/week  PT DURATION:  10 sessions  PLANNED INTERVENTIONS: Therapeutic exercises, Therapeutic activity, Neuromuscular re-education, Patient/Family education, Self Care, Joint mobilization, Aquatic Therapy, Dry Needling, Spinal mobilization, Cryotherapy, Moist heat, scar mobilization, Taping, Biofeedback, and Manual therapy  PLAN FOR NEXT SESSION: beginner core/hip strengthening with cues for pelvic floor contraction if able, internal if needed for improved coordination of pelvic floor   Stacy Gardner, PT, DPT 05/05/2311:50 PM

## 2022-05-05 ENCOUNTER — Other Ambulatory Visit: Payer: BC Managed Care – PPO

## 2022-05-05 ENCOUNTER — Ambulatory Visit: Payer: BC Managed Care – PPO | Admitting: Hematology and Oncology

## 2022-05-05 ENCOUNTER — Ambulatory Visit: Payer: BC Managed Care – PPO

## 2022-05-12 ENCOUNTER — Inpatient Hospital Stay: Payer: BC Managed Care – PPO

## 2022-05-12 ENCOUNTER — Ambulatory Visit: Payer: BC Managed Care – PPO | Admitting: Physical Therapy

## 2022-05-12 DIAGNOSIS — M62838 Other muscle spasm: Secondary | ICD-10-CM

## 2022-05-12 DIAGNOSIS — R293 Abnormal posture: Secondary | ICD-10-CM

## 2022-05-12 DIAGNOSIS — M6281 Muscle weakness (generalized): Secondary | ICD-10-CM | POA: Diagnosis not present

## 2022-05-12 DIAGNOSIS — R279 Unspecified lack of coordination: Secondary | ICD-10-CM

## 2022-05-12 NOTE — Therapy (Signed)
OUTPATIENT PHYSICAL THERAPY FEMALE PELVIC EVALUATION   Patient Name: Hannah Woodard MRN: [id number] DOB:[birth year], 51 y.o., female Today's Date: 05/12/2022   PT End of Session - 05/12/22 1235     Visit Number 3    Date for PT Re-Evaluation 07/29/22    Authorization Type BCBS    PT Start Time 1233    PT Stop Time 1314    PT Time Calculation (min) 41 min    Activity Tolerance Patient tolerated treatment well    Behavior During Therapy [facility] for tasks assessed/performed             Past Medical History:  Diagnosis Date   Breast cancer ([place])    Family history of ovarian cancer 09/02/2017   Family history of prostate cancer in father 09/02/2017   Family history of uterine cancer 09/02/2017   Plantar fasciitis    Past Surgical History:  Procedure Laterality Date   PORT-A-CATH REMOVAL Left 2020   right lumpectomy, sentinel node biopsy, and bilateral mammoplasty  10/2017   [facility]   Patient Active Problem List   Diagnosis Date Noted   Overweight (BMI 25.0-29.9) 12/31/2020   Hyperlipidemia 12/30/2020   Neuropathy due to chemotherapeutic drug ([place]) 12/31/2017   Port-A-Cath in place 11/12/2017   Genetic testing 09/15/2017   Malignant neoplasm of upper-inner quadrant of right breast in female, estrogen receptor positive ([place]) 08/27/2017   Vitamin D deficiency 12/09/2015   Medication management 12/09/2015   Dense breast tissue 12/09/2015    PCP: Hayden Rasmussen, MD  REFERRING PROVIDER: Dorothyann Gibbs, NP  REFERRING DIAG:  R35.0 (ICD-10-CM) - Urinary frequency N94.10 (ICD-10-CM) - Dyspareunia in female T37.6X5A (ICD-10-CM) - Adverse effect of antigonadotrophins, antiestrogens, antiandrogens, not elsewhere classified, initial encounter  THERAPY DIAG:  Muscle weakness (generalized)  Abnormal posture  Unspecified lack of coordination  Other muscle spasm  Rationale for Evaluation and Treatment Rehabilitation  ONSET DATE: years  SUBJECTIVE:                                                                                                                                                                                            SUBJECTIVE STATEMENT: Pt reports V-magic has been so helpful and can tell a big difference with this. Pt has not been able to do HEP while on trip and hasn't done dilator due to being on a trip either. Pt reports she has had two nights with nighttime urination up to 4 times while on trip but did drink water until bedtime but states she was having a hard time getting to sleep in general and then have urge to urinate, not waking  with urge. Pt does endorse tailbone soreness with sitting after car drive.  Fluid intake: Yes: 60 oz water, tea sometimes but not regular       PAIN:  Are you having pain? Yes NPRS scale: 2/10 Pain location:  bil hips are sore after traveling and sitting mroe  Pain type: tight Pain description: intermittent   Aggravating factors: walking, sitting for long time, tight pants, intercourse Relieving factors: rest  PRECAUTIONS: None  WEIGHT BEARING RESTRICTIONS No  FALLS:  Has patient fallen in last 6 months? 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  PLOF: Independent  PATIENT GOALS to have less pain, more regular voids  PERTINENT HISTORY:  personal history of triple positive breast cancer at age 68 and family history of ovarian cancer, BRCA negative on genetic testing Vaginal atrophy Malignant neoplasm of upper-inner quadrant of right breast in female, estrogen receptor positive   Sexual abuse: No  BOWEL MOVEMENT Pain with bowel movement: No Type of bowel movement:Type (Bristol Stool Scale) 2-3, Frequency daily, and Strain Yes but this has improved since starting using step stool in bathroom Fully empty rectum: No Leakage: No Pads: No Fiber supplement: Yes:    URINATION Pain with urination:  No sometimes, rarely will have a burning Fully empty bladder: No Stream: Strong and Weak Urgency: Yes: consistently Frequency: 2 hours sometimes sooner, usually gets up at night 1x Leakage: Coughing and Sneezing Pads: No  INTERCOURSE Pain with intercourse: Initial Penetration, During Penetration, After Intercourse, and Pain Interrupts Intercourse Ability to have vaginal penetration:  Yes:   Climax: not painful but pain prevents climax sometimes Marinoff Scale: 2/3  PREGNANCY Vaginal deliveries 0 Tearing No C-section deliveries 0 Currently pregnant No  PROLAPSE None    OBJECTIVE:   DIAGNOSTIC FINDINGS:   COGNITION:  Overall cognitive status: Within functional limits for tasks assessed     SENSATION:  Light touch: Appears intact   Proprioception: Appears intact  MUSCLE LENGTH: Bil hamstring and adductor limited by 25%                POSTURE: rounded shoulders and forward head    LUMBARAROM/PROM  A/PROM A/PROM  eval  Flexion Limited by 25%  Extension WFL  Right lateral flexion Limited by 25%  Left lateral flexion Limited by 25%  Right rotation WFL  Left rotation WFL   (Blank rows = not tested)  LOWER EXTREMITY ROM:  Bil WFL LOWER EXTREMITY MMT:   Bil hips 4/5 grossly, knees and ankles 5/5   PALPATION:   General  no TTP, mild fascial restrictions noted in lower abdominal quadrants and over bladder                External Perineal Exam no TTP, dryness noted externally                             Internal Pelvic Floor no TTP however increased tension noted throughout  Patient confirms identification and approves PT to assess internal pelvic floor and treatment Yes  PELVIC MMT:   MMT eval  Vaginal 1/5 with max cues and quick release; without cues 0/5; 1s; 1 rep  Internal Anal Sphincter   External Anal Sphincter   Puborectalis   Diastasis Recti   (Blank rows = not tested)        TONE: Mildly increased  PROLAPSE: Not seen in hooklying  TODAY'S TREATMENT   05/12/2022: Therapeutic exercise: Hamstring stretches with strap 2x1 mins each Bil piriformis stretch  2x1 mins each Butterfly stretch  2x1 mins each Hip IR stretch  2x1 mins each) Tail wags x10  05/04/22: Pt had several questions about moisturizers and estrogens role ine vaginal tissue. Pt educated on non-hormonal moisturizers and instructed pt to ask oncologist about any estrogen based medications due to her cancer history. Pt understands this and understands PT cannot make any medication based recommendations, these should be asked to MD.  Pt reports she still feels like she has a hard time understanding if she is contracting at pelvic floor or not and asked for internal feedback today from PT Pt consented to internal vaginal pelvic floor treatment with NMRE focus.  3x10 pelvic floor contractions with rest between sets due to fatigue. Pt initially had 0/5 contraction then 1/5 but with downward pressure and abdominal bulge with valsalva. Pt instructed on breathing mechanics, then once pt grasped this progressed to coordinated breathing with pelvic floor contraction. Pt consistently demonstrated 1/5 with this and reports she could feel a difference in not pushing down but still does not feel movement yet.  Quick release NMRE technique employed to improve muscle activation at pelvic floor superficial muscle layer with slightly increased strength but not 2/5 yet. Pt agreeable to attempt these at home with internal feedback of either her own finger or spouse to help if pt consents to this.   PATIENT EDUCATION:  Education details: [id number] Person educated: Patient Education method: Explanation, Demonstration, Corporate treasurer cues, Verbal cues, and Handouts Education comprehension: verbalized understanding and returned demonstration   HOME EXERCISE PROGRAM: [id number]  ASSESSMENT:  CLINICAL IMPRESSION: Patient reports she has been "off" a bit due to traveling but overall doing  ok. Pt session focused on stretching bil hips, spine, and abdominal region for decreased tension and improve tissue mobility at pelvic floor for less pain. Pt would benefit from additional PT to further address deficits.     OBJECTIVE IMPAIRMENTS decreased coordination, decreased endurance, decreased mobility, decreased strength, increased fascial restrictions, increased muscle spasms, impaired flexibility, improper body mechanics, postural dysfunction, and pain.   ACTIVITY LIMITATIONS continence, locomotion level, and intercourse  PARTICIPATION LIMITATIONS: interpersonal relationship and community activity  PERSONAL FACTORS Past/current experiences, Time since onset of injury/illness/exacerbation, and 1 comorbidity: medical history  are also affecting patient's functional outcome.   REHAB POTENTIAL: Good  CLINICAL DECISION MAKING: Stable/uncomplicated  EVALUATION COMPLEXITY: Low   GOALS: Goals reviewed with patient? Yes  SHORT TERM GOALS: Target date: 05/26/2022  Pt to be I with HEP.  Baseline: Goal status: INITIAL  2.  Pt to be I with use of feminine moisturizers and lubricants as needed for improved vaginal health and decreased pain with penetration.  Baseline:  Goal status: INITIAL  3.  Pt to demonstrated improved coordination with pelvic floor and breathing mechanics with body weight squats without leakage or pain for improved pelvic stability.  Baseline:  Goal status: INITIAL   LONG TERM GOALS: Target date:  07/29/22    Pt to be I with advanced HEP.  Baseline:  Goal status: INITIAL  2.  Pt to demonstrate at least 5/5 bil hip strength for improved pelvic stability and functional squats without leakage.  Baseline:  Goal status: INITIAL  3. Pt to demonstrate at least 3/5 pelvic floor strength for improved pelvic stability and decreased strain at pelvic floor/ decrease leakage.  Baseline:  Goal status: INITIAL  4.  Pt to report improved time between  bladder voids to  at least 2.5 hours for improved QOL with decreased urinary frequency.   Baseline:  Goal status: INITIAL  5.  Pt to report no more than 2/10 pain with vaginal penetration for improved QOL and decreased pain with medical procedures. Baseline:  Goal status: INITIAL    PLAN: PT FREQUENCY: 1x/week  PT DURATION:  10 sessions  PLANNED INTERVENTIONS: Therapeutic exercises, Therapeutic activity, Neuromuscular re-education, Patient/Family education, Self Care, Joint mobilization, Aquatic Therapy, Dry Needling, Spinal mobilization, Cryotherapy, Moist heat, scar mobilization, Taping, Biofeedback, and Manual therapy  PLAN FOR NEXT SESSION: beginner core/hip strengthening with cues for pelvic floor contraction if able, internal if needed for improved coordination of pelvic floor   Stacy Gardner, PT, DPT 08/15/231:14 PM

## 2022-05-13 ENCOUNTER — Other Ambulatory Visit: Payer: Self-pay

## 2022-05-13 ENCOUNTER — Inpatient Hospital Stay: Payer: BC Managed Care – PPO | Attending: Hematology and Oncology

## 2022-05-13 VITALS — BP 136/90 | HR 63 | Temp 98.5°F | Resp 20

## 2022-05-13 DIAGNOSIS — C50211 Malignant neoplasm of upper-inner quadrant of right female breast: Secondary | ICD-10-CM | POA: Insufficient documentation

## 2022-05-13 DIAGNOSIS — Z5111 Encounter for antineoplastic chemotherapy: Secondary | ICD-10-CM | POA: Insufficient documentation

## 2022-05-13 DIAGNOSIS — Z95828 Presence of other vascular implants and grafts: Secondary | ICD-10-CM

## 2022-05-13 DIAGNOSIS — Z170 Estrogen receptor positive status [ER+]: Secondary | ICD-10-CM | POA: Diagnosis not present

## 2022-05-13 MED ORDER — GOSERELIN ACETATE 10.8 MG ~~LOC~~ IMPL
10.8000 mg | DRUG_IMPLANT | Freq: Once | SUBCUTANEOUS | Status: AC
  Administered 2022-05-13: 10.8 mg via SUBCUTANEOUS
  Filled 2022-05-13: qty 10.8

## 2022-05-28 ENCOUNTER — Ambulatory Visit: Payer: BC Managed Care – PPO | Admitting: Physical Therapy

## 2022-05-28 DIAGNOSIS — R279 Unspecified lack of coordination: Secondary | ICD-10-CM

## 2022-05-28 DIAGNOSIS — R293 Abnormal posture: Secondary | ICD-10-CM

## 2022-05-28 DIAGNOSIS — M6281 Muscle weakness (generalized): Secondary | ICD-10-CM

## 2022-05-28 NOTE — Therapy (Signed)
OUTPATIENT PHYSICAL THERAPY FEMALE PELVIC TREATMENT   Patient Name: Hannah Woodard MRN: [id number] DOB:06/08/1971, 51 y.o., female Today's Date: 05/28/2022   PT End of Session - 05/28/22 1617     Visit Number 4    Date for PT Re-Evaluation 07/29/22    Authorization Type [employer]    PT Start Time 1615    PT Stop Time 1655    PT Time Calculation (min) 40 min    Activity Tolerance Patient tolerated treatment well    Behavior During Therapy [facility] for tasks assessed/performed             Past Medical History:  Diagnosis Date   Breast cancer ([place])    Family history of ovarian cancer 09/02/2017   Family history of prostate cancer in father 09/02/2017   Family history of uterine cancer 09/02/2017   Plantar fasciitis    Past Surgical History:  Procedure Laterality Date   PORT-A-CATH REMOVAL Left 2020   right lumpectomy, sentinel node biopsy, and bilateral mammoplasty  10/2017   [facility]   Patient Active Problem List   Diagnosis Date Noted   Overweight (BMI 25.0-29.9) 12/31/2020   Hyperlipidemia 12/30/2020   Neuropathy due to chemotherapeutic drug ([place]) 12/31/2017   Port-A-Cath in place 11/12/2017   Genetic testing 09/15/2017   Malignant neoplasm of upper-inner quadrant of right breast in female, estrogen receptor positive ([place]) 08/27/2017   Vitamin D deficiency 12/09/2015   Medication management 12/09/2015   Dense breast tissue 12/09/2015    PCP: Hayden Rasmussen, MD  REFERRING PROVIDER: Dorothyann Gibbs, NP  REFERRING DIAG:  R35.0 (ICD-10-CM) - Urinary frequency N94.10 (ICD-10-CM) - Dyspareunia in female T72.6X5A (ICD-10-CM) - Adverse effect of antigonadotrophins, antiestrogens, antiandrogens, not elsewhere classified, initial encounter  THERAPY DIAG:  Unspecified lack of coordination  Muscle weakness (generalized)  Abnormal posture  Rationale for Evaluation and Treatment Rehabilitation  ONSET DATE: years  SUBJECTIVE:                                                                                                                                                                                            SUBJECTIVE STATEMENT: Pt reports her tailbone is sore today, after adjustment from chiropractor a couple days ago and pt reports she has been sitting long periods of time today.  Feels like overall the urinary frequency has been similar but is drinking a lot water lately.  Moisturizers continue to help, has started using the dilator and does have pain with depth of penetration however reports she has been doing it standing and daily.   Fluid intake: Yes: 60 oz water, tea sometimes but not regular  PAIN:  Are you having pain? Yes NPRS scale: 2/10 Pain location:  bil hips are sore after traveling and sitting [place]  Pain type: tight Pain description: intermittent   Aggravating factors: walking, sitting for long time, tight pants, intercourse Relieving factors: rest  PRECAUTIONS: None  WEIGHT BEARING RESTRICTIONS No  FALLS:  Has patient fallen in last 6 months? 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  PLOF: Independent  PATIENT GOALS to have less pain, more regular voids  PERTINENT HISTORY:  personal history of triple positive breast cancer at age 5 and family history of ovarian cancer, BRCA negative on genetic testing Vaginal atrophy Malignant neoplasm of upper-inner quadrant of right breast in female, estrogen receptor positive   Sexual abuse: No  BOWEL MOVEMENT Pain with bowel movement: No Type of bowel movement:Type (Bristol Stool Scale) 2-3, Frequency daily, and Strain Yes but this has improved since starting using step stool in bathroom Fully empty rectum: No Leakage: No Pads: No Fiber supplement: Yes:    URINATION Pain with urination: No sometimes, rarely will have a burning Fully empty bladder: No Stream: Strong and  Weak Urgency: Yes: consistently Frequency: 2 hours sometimes sooner, usually gets up at night 1x Leakage: Coughing and Sneezing Pads: No  INTERCOURSE Pain with intercourse: Initial Penetration, During Penetration, After Intercourse, and Pain Interrupts Intercourse Ability to have vaginal penetration:  Yes:   Climax: not painful but pain prevents climax sometimes Marinoff Scale: 2/3  PREGNANCY Vaginal deliveries 0 Tearing No C-section deliveries 0 Currently pregnant No  PROLAPSE None    OBJECTIVE:   DIAGNOSTIC FINDINGS:   COGNITION:  Overall cognitive status: Within functional limits for tasks assessed     SENSATION:  Light touch: Appears intact   Proprioception: Appears intact  MUSCLE LENGTH: Bil hamstring and adductor limited by 25%                POSTURE: rounded shoulders and forward head    LUMBARAROM/PROM  A/PROM A/PROM  eval  Flexion Limited by 25%  Extension WFL  Right lateral flexion Limited by 25%  Left lateral flexion Limited by 25%  Right rotation WFL  Left rotation WFL   (Blank rows = not tested)  LOWER EXTREMITY ROM:  Bil WFL LOWER EXTREMITY MMT:   Bil hips 4/5 grossly, knees and ankles 5/5   PALPATION:   General  no TTP, mild fascial restrictions noted in lower abdominal quadrants and over bladder                External Perineal Exam no TTP, dryness noted externally                             Internal Pelvic Floor no TTP however increased tension noted throughout  Patient confirms identification and approves PT to assess internal pelvic floor and treatment Yes  PELVIC MMT:   MMT eval  Vaginal 1/5 with max cues and quick release; without cues 0/5; 1s; 1 rep  Internal Anal Sphincter   External Anal Sphincter   Puborectalis   Diastasis Recti   (Blank rows = not tested)        TONE: Mildly increased  PROLAPSE: Not seen in hooklying   TODAY'S TREATMENT   05/28/22: Pt educated on dilator use at home to lay down or be in  more relaxed position and to attempt 2-3x per week instead of  daily.  Piriformis stretch 2x30s each Quad hip IR rotation stretch 2x30s Cat/cow x10 Tail wags x10 NMRE: coccyx with external pressure with quad rocking x10, x10 lateral rocking as well Deep squat stretch x30 with block  05/12/2022: Therapeutic exercise: Hamstring stretches with strap 2x1 mins each Bil piriformis stretch  2x1 mins each Butterfly stretch  2x1 mins each Hip IR stretch  2x1 mins each) Tail wags x10  05/04/22: Pt had several questions about moisturizers and estrogens role ine vaginal tissue. Pt educated on non-hormonal moisturizers and instructed pt to ask oncologist about any estrogen based medications due to her cancer history. Pt understands this and understands PT cannot make any medication based recommendations, these should be asked to MD.  Pt reports she still feels like she has a hard time understanding if she is contracting at pelvic floor or not and asked for internal feedback today from PT Pt consented to internal vaginal pelvic floor treatment with NMRE focus.  3x10 pelvic floor contractions with rest between sets due to fatigue. Pt initially had 0/5 contraction then 1/5 but with downward pressure and abdominal bulge with valsalva. Pt instructed on breathing mechanics, then once pt grasped this progressed to coordinated breathing with pelvic floor contraction. Pt consistently demonstrated 1/5 with this and reports she could feel a difference in not pushing down but still does not feel movement yet.  Quick release NMRE technique employed to improve muscle activation at pelvic floor superficial muscle layer with slightly increased strength but not 2/5 yet. Pt agreeable to attempt these at home with internal feedback of either her own finger or spouse to help if pt consents to this.   PATIENT EDUCATION:  Education details: [id number] Person educated: Patient Education method: Consulting civil engineer, Demonstration, Tactile  cues, Verbal cues, and Handouts Education comprehension: verbalized understanding and returned demonstration   HOME EXERCISE PROGRAM: [id number]  ASSESSMENT:  CLINICAL IMPRESSION: Patient reports she has been working on dilator at home but has been doing in standing and educated to attempt laying down as to decrease pain felt in standing. Pt reports she was in a hurry and attempting to do this with other tasks but will try to be more relaxed with it to work more on stretching. Pt has not been able to do HEP but plans to try this week. Pt session focused on stretching bil hips, spine and discussed importance of hip mobility and pelvic relaxation to attempt to decrease pain with intercourse and then begin strengthening pelvic floor for decreased tension and improve tissue mobility at pelvic floor for less pain. Pt would benefit from additional PT to further address deficits.     OBJECTIVE IMPAIRMENTS decreased coordination, decreased endurance, decreased mobility, decreased strength, increased fascial restrictions, increased muscle spasms, impaired flexibility, improper body mechanics, postural dysfunction, and pain.   ACTIVITY LIMITATIONS continence, locomotion level, and intercourse  PARTICIPATION LIMITATIONS: interpersonal relationship and community activity  PERSONAL FACTORS Past/current experiences, Time since onset of injury/illness/exacerbation, and 1 comorbidity: medical history  are also affecting patient's functional outcome.   REHAB POTENTIAL: Good  CLINICAL DECISION MAKING: Stable/uncomplicated  EVALUATION COMPLEXITY: Low   GOALS: Goals reviewed with patient? Yes  SHORT TERM GOALS: Target date: 05/26/2022  Pt to be I with HEP.  Baseline: Goal status: INITIAL  2.  Pt to be I with use of feminine moisturizers and lubricants as needed for improved vaginal health and decreased pain with penetration.  Baseline:  Goal status: INITIAL  3.  Pt to demonstrated improved  coordination with pelvic floor  and breathing mechanics with body weight squats without leakage or pain for improved pelvic stability.  Baseline:  Goal status: INITIAL   LONG TERM GOALS: Target date:  07/29/22    Pt to be I with advanced HEP.  Baseline:  Goal status: INITIAL  2.  Pt to demonstrate at least 5/5 bil hip strength for improved pelvic stability and functional squats without leakage.  Baseline:  Goal status: INITIAL  3. Pt to demonstrate at least 3/5 pelvic floor strength for improved pelvic stability and decreased strain at pelvic floor/ decrease leakage.  Baseline:  Goal status: INITIAL  4.  Pt to report improved time between bladder voids to at least 2.5 hours for improved QOL with decreased urinary frequency.   Baseline:  Goal status: INITIAL  5.  Pt to report no more than 2/10 pain with vaginal penetration for improved QOL and decreased pain with medical procedures. Baseline:  Goal status: INITIAL    PLAN: PT FREQUENCY: 1x/week  PT DURATION:  10 sessions  PLANNED INTERVENTIONS: Therapeutic exercises, Therapeutic activity, Neuromuscular re-education, Patient/Family education, Self Care, Joint mobilization, Aquatic Therapy, Dry Needling, Spinal mobilization, Cryotherapy, Moist heat, scar mobilization, Taping, Biofeedback, and Manual therapy  PLAN FOR NEXT SESSION: beginner core/hip strengthening with cues for pelvic floor contraction if able, internal if needed for improved coordination of pelvic floor   Stacy Gardner, PT, DPT 08/31/235:01 PM

## 2022-06-02 ENCOUNTER — Other Ambulatory Visit: Payer: Self-pay

## 2022-06-02 ENCOUNTER — Other Ambulatory Visit: Payer: Self-pay | Admitting: *Deleted

## 2022-06-02 ENCOUNTER — Inpatient Hospital Stay (HOSPITAL_BASED_OUTPATIENT_CLINIC_OR_DEPARTMENT_OTHER): Payer: BC Managed Care – PPO | Admitting: Hematology and Oncology

## 2022-06-02 ENCOUNTER — Encounter: Payer: Self-pay | Admitting: Hematology and Oncology

## 2022-06-02 ENCOUNTER — Inpatient Hospital Stay: Payer: BC Managed Care – PPO | Attending: Hematology and Oncology

## 2022-06-02 VITALS — BP 144/85 | HR 78 | Temp 98.2°F | Resp 16 | Ht 65.0 in | Wt 164.8 lb

## 2022-06-02 DIAGNOSIS — Z170 Estrogen receptor positive status [ER+]: Secondary | ICD-10-CM | POA: Insufficient documentation

## 2022-06-02 DIAGNOSIS — M2550 Pain in unspecified joint: Secondary | ICD-10-CM | POA: Insufficient documentation

## 2022-06-02 DIAGNOSIS — C50211 Malignant neoplasm of upper-inner quadrant of right female breast: Secondary | ICD-10-CM

## 2022-06-02 DIAGNOSIS — Z803 Family history of malignant neoplasm of breast: Secondary | ICD-10-CM | POA: Insufficient documentation

## 2022-06-02 DIAGNOSIS — Z808 Family history of malignant neoplasm of other organs or systems: Secondary | ICD-10-CM | POA: Insufficient documentation

## 2022-06-02 DIAGNOSIS — Z79811 Long term (current) use of aromatase inhibitors: Secondary | ICD-10-CM | POA: Diagnosis not present

## 2022-06-02 DIAGNOSIS — N951 Menopausal and female climacteric states: Secondary | ICD-10-CM

## 2022-06-02 DIAGNOSIS — G629 Polyneuropathy, unspecified: Secondary | ICD-10-CM | POA: Insufficient documentation

## 2022-06-02 DIAGNOSIS — Z8042 Family history of malignant neoplasm of prostate: Secondary | ICD-10-CM | POA: Diagnosis not present

## 2022-06-02 DIAGNOSIS — Z8041 Family history of malignant neoplasm of ovary: Secondary | ICD-10-CM | POA: Diagnosis not present

## 2022-06-02 LAB — CMP (CANCER CENTER ONLY)
ALT: 19 U/L (ref 0–44)
AST: 19 U/L (ref 15–41)
Albumin: 4.8 g/dL (ref 3.5–5.0)
Alkaline Phosphatase: 94 U/L (ref 38–126)
Anion gap: 4 — ABNORMAL LOW (ref 5–15)
BUN: 16 mg/dL (ref 6–20)
CO2: 33 mmol/L — ABNORMAL HIGH (ref 22–32)
Calcium: 9.9 mg/dL (ref 8.9–10.3)
Chloride: 104 mmol/L (ref 98–111)
Creatinine: 0.65 mg/dL (ref 0.44–1.00)
GFR, Estimated: >60 mL/min (ref >60)
Glucose, Bld: 100 mg/dL — ABNORMAL HIGH (ref 70–99)
Potassium: 3.8 mmol/L (ref 3.5–5.1)
Sodium: 141 mmol/L (ref 135–145)
Total Bilirubin: 0.5 mg/dL (ref 0.3–1.2)
Total Protein: 7.2 g/dL (ref 6.5–8.1)

## 2022-06-02 LAB — CBC WITH DIFFERENTIAL (CANCER CENTER ONLY)
Abs Immature Granulocytes: 0.01 10*3/uL (ref 0.00–0.07)
Basophils Absolute: 0 10*3/uL (ref 0.0–0.1)
Basophils Relative: 0 %
Eosinophils Absolute: 0 10*3/uL (ref 0.0–0.5)
Eosinophils Relative: 1 %
HCT: 37.4 % (ref 36.0–46.0)
Hemoglobin: 13.3 g/dL (ref 12.0–15.0)
Immature Granulocytes: 0 %
Lymphocytes Relative: 25 %
Lymphs Abs: 1.2 10*3/uL (ref 0.7–4.0)
MCH: 33.9 pg (ref 26.0–34.0)
MCHC: 35.6 g/dL (ref 30.0–36.0)
MCV: 95.4 fL (ref 80.0–100.0)
Monocytes Absolute: 0.3 10*3/uL (ref 0.1–1.0)
Monocytes Relative: 7 %
Neutro Abs: 3.1 10*3/uL (ref 1.7–7.7)
Neutrophils Relative %: 67 %
Platelet Count: 239 10*3/uL (ref 150–400)
RBC: 3.92 MIL/uL (ref 3.87–5.11)
RDW: 13.1 % (ref 11.5–15.5)
WBC Count: 4.7 10*3/uL (ref 4.0–10.5)
nRBC: 0 % (ref 0.0–0.2)

## 2022-06-02 NOTE — Progress Notes (Signed)
[facility]  [phone]) [phone] [phone]) [phone]     ID: Hannah Woodard DOB: [birth year]  MR#: [id number]  AQT#:[id number]  Patient Care Team: Hayden Rasmussen, MD as PCP - General (Family Medicine) Jovita Kussmaul, MD as Consulting Physician (General Surgery) Magrinat, Virgie Dad, MD (Inactive) as Consulting Physician (Oncology) Kyung Rudd, MD as Consulting Physician (Radiation Oncology) Lorelle Gibbs, MD (Radiology) Angelina Ok, MD as Referring Physician (Surgery) Everitt Amber, MD as Consulting Physician (Gynecologic Oncology) OTHER MD:  CHIEF COMPLAINT: Triple positive breast cancer  CURRENT TREATMENT: anastrozole, Zoladex  INTERVAL HISTORY: Hannah Woodard returns today for follow-up of her triple positive breast cancer.  She is now on anastrozole and goserelin, she did not want to take tamoxifen given cardiovascular events and risk of endometrial cancer.  She tells me that endometrial cancer and cardiovascular events run in the family.  She overall has been tolerating this combination well except for vaginal dryness and dyspareunia.  She is now working with pelvic floor rehabilitation which she is finding useful.  She has also tried coconut oil as well as vitamin E suppositories which were not very helpful.  She has noted some mild arthralgias with anastrozole and uses CBD oil which has been working wonders for her.  She is now receiving Zoladex every 12 weeks her most recent mammogram did not show any evidence of malignancy, [facility].  Her most recent bone density was on 10/30/2020 at [facility] and showed a T score of -0.8 Her last pelvic ultrasound was in June 2023 which once again commented on fibroids.  She does this because there is a significant family history of ovarian cancer.  Most of our discussion today was about her vaginal dryness, pelvic rehabilitation and role of vaginal estrogen. Rest of the pertinent 10 point ROS  reviewed and negative   HISTORY OF CURRENT ILLNESS: From the original intake note:  The patient has a history of cysts and nodules in the right breast which have been closely followed, with exams 07/30/2016 and 01/28/2017.  On 07/29/2017 follow-up right breast ultrasonography showed no findings of concern.  Bilateral diagnostic mammography 08/18/2017 with right breast ultrasonography on 08/18/2017 at [facility] found the breast density to be category C.  There was now a new irregular high density mass in the posterior portion of the right breast seen on the mediolateral oblique view only.  Ultrasonography confirmed a 1.4 cm lobulated mass in the upper inner quadrant of the right breast 10 cm from the nipple associated with pectoral muscle invasion.  The right axilla was sonographically benign.  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  The patient's subsequent history is as detailed below.   PAST MEDICAL HISTORY: Past Medical History:  Diagnosis Date   Breast cancer ([place])    Family history of ovarian cancer 09/02/2017   Family history of prostate cancer in father 09/02/2017   Family history of uterine cancer 09/02/2017   Plantar fasciitis     PAST SURGICAL HISTORY: Past Surgical History:  Procedure Laterality Date   PORT-A-CATH REMOVAL Left 2020   right lumpectomy, sentinel node biopsy, and bilateral mammoplasty  10/2017   [facility]    FAMILY HISTORY Family History  Problem Relation Age of Onset   Ovarian cancer Mother 23  Prostate cancer Father 28       'high gleason' unsure number   Ulcerative colitis Sister    Other Sister        abn ovaian growth, partial hysterectomy   Basal cell carcinoma Sister 13   Uterine cancer Maternal Aunt 33    Stroke Maternal Aunt 66   Basal cell carcinoma Brother    Skin cancer Paternal Uncle    Skin cancer Maternal Grandmother    Stroke Paternal Grandfather 74   Hydrocephalus Cousin   The patient's father died from alcoholic cirrhosis at age 94.  He had been diagnosed with prostate cancer at age 80.  The patient's mother was diagnosed with ovarian cancer at age 47 and died a year later.  The patient has 1 brother, 1 sister.  The patient's brother has had basal cell and other skin cancers.  A paternal aunt had uterine cancer.  Other relatives have had skin cancers but the patient does not know if these were melanomas or not   GYNECOLOGIC HISTORY:  Patient's last menstrual period was 10/05/2017.  Menarche age 51, the patient has never been pregnant.  She is still having regular periods.  She used oral contraceptives briefly in the past without complications. She and her husband are very interested in having children, and currently (December 2018) they are not using any contraception.   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  Her husband Hannah Woodard (goes by [employer]") is a Dealer.  At home it's just them, a 49 year old [place] hound and a cat.   ADVANCED DIRECTIVES: In the absence of any documents to the contrary the patient's husband is her healthcare power of attorney   HEALTH MAINTENANCE: Social History   Tobacco Use   Smoking status: Never   Smokeless tobacco: Never  Vaping Use   Vaping Use: Never used  Substance Use Topics   Alcohol use: Not Currently    Comment: rarely    Drug use: No     Colonoscopy: Never  PAP:  Bone density:    Allergies  Allergen Reactions   Sulfamethoxazole-Trimethoprim Rash   Adhesive [Tape]    Milk-Related Compounds Diarrhea    Stomach cramping   Other Rash and Other (See Comments)    Frangrance   Poison Ivy Extract [Poison Ivy Extract] Hives, Itching and Rash    Current  Outpatient Medications  Medication Sig Dispense Refill   anastrozole (ARIMIDEX) 1 MG tablet Take 1 tablet (1 mg total) by mouth daily. 90 tablet 4   ascorbic acid (VITAMIN C) 1000 MG tablet Take by mouth.     b complex vitamins tablet Take 1 tablet by mouth daily.     Bioflavonoid Products (QUERCETIN COMPLEX IMMUNE PO) Take by mouth. W/ bromelain     Cholecalciferol (VITAMIN D3) 5000 units TABS Take 10,000 Units by mouth daily.     COLLAGEN PO Take by mouth daily. Takes beef collagen     desoximetasone (TOPICORT) 0.25 % cream Apply topically daily as needed.     fish oil-omega-3 fatty acids 1000 MG capsule Take 1 g by mouth daily.     gabapentin (NEURONTIN) 300 MG capsule Take 1 capsule (300 mg total) by mouth at bedtime. 90 capsule 4   glucosamine-chondroitin 500-400 MG tablet Take 3 tablets by mouth daily.     Green Tea 200 MG CAPS Take 1 capsule by mouth daily in the afternoon.     lactobacillus acidophilus (BACID) TABS  tablet Take 2 tablets by mouth 3 (three) times daily.     Magnesium Citrate POWD Take by mouth daily.     Menaquinone-7 (VITAMIN K2 PO) Take 150 mcg by mouth daily.     montelukast (SINGULAIR) 10 MG tablet TAKE 1 TABLET (10 MG) BY MOUTH EVERY EVENING     ondansetron (ZOFRAN-ODT) 4 MG disintegrating tablet TAKE 1 TABLET BY MOUTH FOUR TIMES A DAY AS NEEDED FOR NAUSEA     senna (SENOKOT) 8.6 MG tablet Take 1 tablet by mouth at bedtime as needed for constipation.     triamcinolone ointment (KENALOG) 0.1 % Apply 1 application topically 2 (two) times daily. 80 g 1   TURMERIC PO Take 1 capsule by mouth as directed.      UNABLE TO FIND Take 500 mg by mouth daily. [place] mushroom     UNABLE TO FIND Take 500 mg by mouth daily. [place] Mushroom     No current facility-administered medications for this visit.    OBJECTIVE:  white woman who appears younger than stated age  41:   06/02/22 1431  BP: (!) 144/85  Pulse: 78  Resp: 16  Temp: 98.2 F (36.8 C)  SpO2: 100%      Body mass index is 27.42 kg/m.   Wt Readings from Last 3 Encounters:  06/02/22 164 lb 12.8 oz (74.8 kg)  04/14/22 164 lb 4.8 oz (74.5 kg)  03/20/22 167 lb (75.8 kg)   ECOG FS:1 - Symptomatic but completely ambulatory  Physical Exam Constitutional:      Appearance: Normal appearance.  Chest:     Comments:  She is status post bilateral reduction mammoplasty with some asymmetry.  No palpable masses or regional adenopathy Musculoskeletal:        General: Swelling (Trace swelling, symmetric in bilateral lower extremities) present.     Cervical back: Normal range of motion and neck supple. No rigidity.  Lymphadenopathy:     Cervical: No cervical adenopathy.  Neurological:     Mental Status: She is alert.       LAB RESULTS:  CMP     Component Value Date/Time   NA 140 04/23/2021 1426   NA 138 09/01/2017 1238   K 4.1 04/23/2021 1426   K 4.4 09/01/2017 1238   CL 101 04/23/2021 1426   CO2 30 04/23/2021 1426   CO2 25 09/01/2017 1238   GLUCOSE 94 04/23/2021 1426   GLUCOSE 83 09/01/2017 1238   BUN 12 04/23/2021 1426   BUN 9.2 09/01/2017 1238   CREATININE 0.63 04/23/2021 1426   CREATININE 0.53 12/31/2020 1531   CREATININE 0.7 09/01/2017 1238   CALCIUM 9.4 04/23/2021 1426   CALCIUM 9.4 09/01/2017 1238   PROT 7.1 04/23/2021 1426   PROT 7.4 09/01/2017 1238   ALBUMIN 3.9 04/23/2021 1426   ALBUMIN 4.2 09/01/2017 1238   AST 19 04/23/2021 1426   AST 21 09/01/2017 1238   ALT 18 04/23/2021 1426   ALT 21 09/01/2017 1238   ALKPHOS 95 04/23/2021 1426   ALKPHOS 60 09/01/2017 1238   BILITOT 0.8 04/23/2021 1426   BILITOT 0.65 09/01/2017 1238   GFRNONAA >60 04/23/2021 1426   GFRNONAA 113 12/31/2020 1531   GFRAA 131 12/31/2020 1531    No results found for: "TOTALPROTELP", "ALBUMINELP", "A1GS", "A2GS", "BETS", "BETA2SER", "GAMS", "MSPIKE", "SPEI"  No results found for: "KPAFRELGTCHN", "LAMBDASER", "KAPLAMBRATIO"  Lab Results  Component Value Date   WBC 4.7 06/02/2022    NEUTROABS 3.1 06/02/2022   HGB 13.3 06/02/2022  HCT 37.4 06/02/2022   MCV 95.4 06/02/2022   PLT 239 06/02/2022      Chemistry      Component Value Date/Time   NA 140 04/23/2021 1426   NA 138 09/01/2017 1238   K 4.1 04/23/2021 1426   K 4.4 09/01/2017 1238   CL 101 04/23/2021 1426   CO2 30 04/23/2021 1426   CO2 25 09/01/2017 1238   BUN 12 04/23/2021 1426   BUN 9.2 09/01/2017 1238   CREATININE 0.63 04/23/2021 1426   CREATININE 0.53 12/31/2020 1531   CREATININE 0.7 09/01/2017 1238      Component Value Date/Time   CALCIUM 9.4 04/23/2021 1426   CALCIUM 9.4 09/01/2017 1238   ALKPHOS 95 04/23/2021 1426   ALKPHOS 60 09/01/2017 1238   AST 19 04/23/2021 1426   AST 21 09/01/2017 1238   ALT 18 04/23/2021 1426   ALT 21 09/01/2017 1238   BILITOT 0.8 04/23/2021 1426   BILITOT 0.65 09/01/2017 1238       No results found for: "LABCA2"  No components found for: "PPJKDT267"  No results for input(s): "INR" in the last 168 hours.  No results found for: "LABCA2"  No results found for: "CAN199"  Lab Results  Component Value Date   CAN125 16.7 03/19/2022    No results found for: "TIW580"  No results found for: "CA2729"  No components found for: "HGQUANT"  No results found for: "CEA1", "CEA" / No results found for: "CEA1", "CEA"   No results found for: "AFPTUMOR"  No results found for: "CHROMOGRNA"  No results found for: "PSA1"  Appointment on 06/02/2022  Component Date Value Ref Range Status   WBC Count 06/02/2022 4.7  4.0 - 10.5 K/uL Final   RBC 06/02/2022 3.92  3.87 - 5.11 MIL/uL Final   Hemoglobin 06/02/2022 13.3  12.0 - 15.0 g/dL Final   HCT 06/02/2022 37.4  36.0 - 46.0 % Final   MCV 06/02/2022 95.4  80.0 - 100.0 fL Final   MCH 06/02/2022 33.9  26.0 - 34.0 pg Final   MCHC 06/02/2022 35.6  30.0 - 36.0 g/dL Final   RDW 06/02/2022 13.1  11.5 - 15.5 % Final   Platelet Count 06/02/2022 239  150 - 400 K/uL Final   nRBC 06/02/2022 0.0  0.0 - 0.2 % Final    Neutrophils Relative % 06/02/2022 67  % Final   Neutro Abs 06/02/2022 3.1  1.7 - 7.7 K/uL Final   Lymphocytes Relative 06/02/2022 25  % Final   Lymphs Abs 06/02/2022 1.2  0.7 - 4.0 K/uL Final   Monocytes Relative 06/02/2022 7  % Final   Monocytes Absolute 06/02/2022 0.3  0.1 - 1.0 K/uL Final   Eosinophils Relative 06/02/2022 1  % Final   Eosinophils Absolute 06/02/2022 0.0  0.0 - 0.5 K/uL Final   Basophils Relative 06/02/2022 0  % Final   Basophils Absolute 06/02/2022 0.0  0.0 - 0.1 K/uL Final   Immature Granulocytes 06/02/2022 0  % Final   Abs Immature Granulocytes 06/02/2022 0.01  0.00 - 0.07 K/uL Final   Performed at [facility], [place] [address] [place], [place] [address]    (this displays the last labs from the last 3 days)  No results found for: "TOTALPROTELP", "ALBUMINELP", "A1GS", "A2GS", "BETS", "BETA2SER", "GAMS", "MSPIKE", "SPEI" (this displays SPEP labs)  No results found for: "KPAFRELGTCHN", "LAMBDASER", "KAPLAMBRATIO" (kappa/lambda light chains)  No results found for: "HGBA", "HGBA2QUANT", "HGBFQUANT", "HGBSQUAN" (Hemoglobinopathy evaluation)   No results found for: "LDH"  Lab Results  Component Value Date   IRON 87 12/31/2020   TIBC 276 12/31/2020   IRONPCTSAT 32 12/31/2020   (Iron and TIBC)  Lab Results  Component Value Date   FERRITIN 262 (H) 12/31/2020    Urinalysis    Component Value Date/Time   COLORURINE YELLOW 12/31/2020 1531   APPEARANCEUR CLEAR 12/31/2020 1531   LABSPEC 1.006 12/31/2020 1531   PHURINE 8.0 12/31/2020 1531   GLUCOSEU NEGATIVE 12/31/2020 1531   HGBUR NEGATIVE 12/31/2020 1531   BILIRUBINUR NEGATIVE 12/27/2017 1905   KETONESUR NEGATIVE 12/31/2020 1531   PROTEINUR NEGATIVE 12/31/2020 1531   UROBILINOGEN 0.2 12/06/2014 1529   NITRITE NEGATIVE 12/31/2020 1531   LEUKOCYTESUR 2+ (A) 12/31/2020 1531    STUDIES: No results found.  Bilateral diagnostic mamm[facility] with [facility]  11/15/2019 Calcifications in the far posterior right breast near the lumpectomy site. These would not be amenable to stereotactic biopsy given their location. A short-term follow-up is recommended.   Left breast mass, likely representing fat necrosis.   Breast composition: Scattered fibroglandular density.   BI-RADS Category: 3 - Probably benign findings.   RECOMMENDATION: Short Interval Follow-up with bilateral diagnostic mammogram and possible left breast ultrasound in 6 months. Additionally, given that the patient has a far posterior lumpectomy site incompletely evaluated mammographically, yearly MRI is recommended.    ELIGIBLE FOR AVAILABLE RESEARCH PROTOCOL: No  ASSESSMENT: 52 y.o. [place] woman status post right breast upper inner quadrant biopsy 08/24/2017 for a clinical T1c N0 invasive ductal carcinoma, triple positive, with an MIB-1 of 60%  (1) genetics testing 09/10/2017 through the Common Hereditary Cancer Panel + Melanoma Panel found no deleterious mutations in: APC, ATM, AXIN2, BAP1, BARD1, BMPR1A, BRCA1, BRCA2, BRIP1, CDH1, CDK4, CDKN2A (p14ARF), CDKN2A (p16INK4a), CHEK2, CTNNA1, DICER1, EPCAM*, GREM1*, KIT, MEN1, MLH1, MSH2, MSH3, MSH6, MUTYH, NBN, NF1, PALB2, PDGFRA, PMS2, POLD1, POLE, POT1, PTEN, RAD50, RAD51C, RAD51D, RB1, SDHB, SDHC, SDHD, SMAD4, SMARCA4, STK11, TP53, TSC1, TSC2, VHL. 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  (a) baseline echocardiogram 09/10/2017 shows an ejection fraction of 55-60%  (b) echocardiogram 12/09/2017 showed an ejection fraction in the 60-65% range  (c) echo in 06/14/2018 shows EF of 60-65%  (d) echo on 10/17/2018 shows EF of 60-65%  (5) adjuvant radiation 02/03/2018 - 03/18/2018  Site/dose:   The patient initially received a dose of 50.4 Gy in 28 fractions to the breast using whole-breast tangent fields. This was delivered using a 3-D conformal technique. The patient then received a boost to the seroma. This delivered an additional 10 Gy in 5 fractions using 9e electrons with a special teletherapy technique. The total dose was 60.4 Gy.   (6) anastrozole started 03/28/2018  (a) bone density 09/05/2018--normal  (b) patient refuses tamoxifen because of family history of strokes and uterine cancer  (c) breast cancer index predicts a risk of late recurrence of 10.6% and suggest significant benefit from continuing endocrine therapy for an extended period  (d) repeat bone density 10/30/2020 shows a  T score of -0.8, normal  (7) goserelin started 09/29/2017, discontinued after 08/05/2018 dose, restarted on 11/04/2018 when her menses resumed  (a) changed to every 47-monthdose beginning November 2021   PLAN:  Ms. JYolandeis here for follow-up on anastrozole and Zoladex. She is tolerating this combination well except for some dyspareunia as well as arthralgias. Physical examination today without any concerns for recurrence.  We have also reviewed her most recent mammogram findings.  With regards to dyspareunia, she is currently following up with pelvic floor rehabilitation, finding some benefit from this.  We have also discussed about coconut oil suppositories, patient was wondering if she can use some vaginal estrogen cream to help her with this.  We have discussed that there is a small amount of vaginal estrogen cream that can be systemically absorbed and may increase the  risk of breast cancer recurrence however it may be safe in most instances.  She is hesitant to start this at this time.  She would like to think about it. For arthralgias, she is using some CBD gel with capsaicin which Has Been Working [facility].   We have also discussed about tamoxifen, she is very worried about cardiovascular events and blood clots with tamoxifen. For now plan remains the same, annual mammogram, continue Zoladex and anastrozole and return to clinic in 1 year or sooner as needed Total time spent: 40 minutes, this is a new patient to me transitioning from Dr. MJana Hakimupon his retirement.  *Total Encounter Time as defined by the Centers for Medicare and Medicaid Services includes, in addition to the face-to-face time of a patient visit (documented in the note above) non-face-to-face time: obtaining and reviewing outside history, ordering and reviewing medications, tests or procedures, care coordination (communications with other health care professionals or caregivers) and documentation in the medical record.

## 2022-06-03 ENCOUNTER — Ambulatory Visit: Payer: BC Managed Care – PPO | Attending: Gynecologic Oncology | Admitting: Physical Therapy

## 2022-06-03 DIAGNOSIS — M6281 Muscle weakness (generalized): Secondary | ICD-10-CM | POA: Diagnosis present

## 2022-06-03 DIAGNOSIS — R279 Unspecified lack of coordination: Secondary | ICD-10-CM | POA: Diagnosis present

## 2022-06-03 DIAGNOSIS — R293 Abnormal posture: Secondary | ICD-10-CM | POA: Insufficient documentation

## 2022-06-03 NOTE — Therapy (Signed)
OUTPATIENT PHYSICAL THERAPY FEMALE PELVIC TREATMENT   Patient Name: Hannah Woodard MRN: [id number] DOB:[birth year], 51 y.o., female Today's Date: 06/03/2022   PT End of Session - 06/03/22 1621     Visit Number 5    Date for PT Re-Evaluation 07/29/22    Authorization Type BCBS    PT Start Time 1618    PT Stop Time 1657    PT Time Calculation (min) 39 min    Activity Tolerance Patient tolerated treatment well    Behavior During Therapy [facility] for tasks assessed/performed             Past Medical History:  Diagnosis Date   Breast cancer ([place])    Family history of ovarian cancer 09/02/2017   Family history of prostate cancer in father 09/02/2017   Family history of uterine cancer 09/02/2017   Plantar fasciitis    Past Surgical History:  Procedure Laterality Date   PORT-A-CATH REMOVAL Left 2020   right lumpectomy, sentinel node biopsy, and bilateral mammoplasty  10/2017   [facility]   Patient Active Problem List   Diagnosis Date Noted   Overweight (BMI 25.0-29.9) 12/31/2020   Hyperlipidemia 12/30/2020   Neuropathy due to chemotherapeutic drug ([place]) 12/31/2017   Port-A-Cath in place 11/12/2017   Genetic testing 09/15/2017   Malignant neoplasm of upper-inner quadrant of right breast in female, estrogen receptor positive ([place]) 08/27/2017   Vitamin D deficiency 12/09/2015   Medication management 12/09/2015   Dense breast tissue 12/09/2015    PCP: Hayden Rasmussen, MD  REFERRING PROVIDER: Dorothyann Gibbs, NP  REFERRING DIAG:  R35.0 (ICD-10-CM) - Urinary frequency N94.10 (ICD-10-CM) - Dyspareunia in female T63.6X5A (ICD-10-CM) - Adverse effect of antigonadotrophins, antiestrogens, antiandrogens, not elsewhere classified, initial encounter  THERAPY DIAG:  Muscle weakness (generalized)  Unspecified lack of coordination  Abnormal posture  Rationale for Evaluation and Treatment Rehabilitation  ONSET DATE: years  SUBJECTIVE:                                                                                                                                                                                            SUBJECTIVE STATEMENT: Pt reports she has been doing exercises and feels like she can feel relaxation and contractions better now.   Fluid intake: Yes: 60 oz water, tea sometimes but not regular       PAIN:  Are you having pain? Yes NPRS scale: 3/10 Pain location:  back and bil knee pain today due to jumping with students today at work.   Pain type: tight Pain description: intermittent   Aggravating factors: walking, sitting for long time, tight pants, intercourse Relieving  factors: rest  PRECAUTIONS: None  WEIGHT BEARING RESTRICTIONS No  FALLS:  Has patient fallen in last 6 months? 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  PLOF: Independent  PATIENT GOALS to have less pain, more regular voids  PERTINENT HISTORY:  personal history of triple positive breast cancer at age 27 and family history of ovarian cancer, BRCA negative on genetic testing Vaginal atrophy Malignant neoplasm of upper-inner quadrant of right breast in female, estrogen receptor positive   Sexual abuse: No  BOWEL MOVEMENT Pain with bowel movement: No Type of bowel movement:Type (Bristol Stool Scale) 2-3, Frequency daily, and Strain Yes but this has improved since starting using step stool in bathroom Fully empty rectum: No Leakage: No Pads: No Fiber supplement: Yes:    URINATION Pain with urination: No sometimes, rarely will have a burning Fully empty bladder: No Stream: Strong and Weak Urgency: Yes: consistently Frequency: 2 hours sometimes sooner, usually gets up at night 1x Leakage: Coughing and Sneezing Pads: No  INTERCOURSE Pain with intercourse: Initial Penetration, During Penetration, After Intercourse, and Pain Interrupts Intercourse Ability to have  vaginal penetration:  Yes:   Climax: not painful but pain prevents climax sometimes Marinoff Scale: 2/3  PREGNANCY Vaginal deliveries 0 Tearing No C-section deliveries 0 Currently pregnant No  PROLAPSE None    OBJECTIVE:   DIAGNOSTIC FINDINGS:   COGNITION:  Overall cognitive status: Within functional limits for tasks assessed     SENSATION:  Light touch: Appears intact   Proprioception: Appears intact  MUSCLE LENGTH: Bil hamstring and adductor limited by 25%                POSTURE: rounded shoulders and forward head    LUMBARAROM/PROM  A/PROM A/PROM  eval  Flexion Limited by 25%  Extension WFL  Right lateral flexion Limited by 25%  Left lateral flexion Limited by 25%  Right rotation WFL  Left rotation WFL   (Blank rows = not tested)  LOWER EXTREMITY ROM:  Bil WFL LOWER EXTREMITY MMT:   Bil hips 4/5 grossly, knees and ankles 5/5   PALPATION:   General  no TTP, mild fascial restrictions noted in lower abdominal quadrants and over bladder                External Perineal Exam no TTP, dryness noted externally                             Internal Pelvic Floor no TTP however increased tension noted throughout  Patient confirms identification and approves PT to assess internal pelvic floor and treatment Yes  PELVIC MMT:   MMT eval 06/03/22   Vaginal 1/5 with max cues and quick release; without cues 0/5; 1s; 1 rep 1/5 initially but with quick release 2/5.    Internal Anal Sphincter    External Anal Sphincter    Puborectalis    Diastasis Recti    (Blank rows = not tested)        TONE: Mildly increased  PROLAPSE: Not seen in hooklying   TODAY'S TREATMENT   06/03/22: Pt consented to internal vaginal assessment this date and found to have slightly improved and more aware of pelvic floor mobility now 3x10 pelvic floor contractions with NMRE quick release to improve activations and extra time for this.  Manual internally with gentle stretching  initially at Lt bulbocavernosus with one trigger  point noted here and pt reporting this is where she feels tight during intercourse. Released well with minimal tightness felt after treatment.   05/28/22: Pt educated on dilator use at home to lay down or be in more relaxed position and to attempt 2-3x per week instead of daily.  Piriformis stretch 2x30s each Quad hip IR rotation stretch 2x30s Cat/cow x10 Tail wags x10 NMRE: coccyx with external pressure with quad rocking x10, x10 lateral rocking as well Deep squat stretch x30 with block  05/12/2022: Therapeutic exercise: Hamstring stretches with strap 2x1 mins each Bil piriformis stretch  2x1 mins each Butterfly stretch  2x1 mins each Hip IR stretch  2x1 mins each) Tail wags x10     PATIENT EDUCATION:  Education details: [id number] Person educated: Patient Education method: Consulting civil engineer, Demonstration, Corporate treasurer cues, Verbal cues, and Handouts Education comprehension: verbalized understanding and returned demonstration   HOME EXERCISE PROGRAM: [id number]  ASSESSMENT:  CLINICAL IMPRESSION: Patient session focused on internal treatment today vaginally, with NMRE needed to improve activation of pelvic floor improved strength felt today at 2/5 with cues and extra time/reps. Pt reports she can feel contractions better now and this helps for home practice. Pt is progressing but does continue to have decreased strength/endurance/coordination of pelvic floor and needs cues for technique. Pt would benefit from additional PT to further address deficits.     OBJECTIVE IMPAIRMENTS decreased coordination, decreased endurance, decreased mobility, decreased strength, increased fascial restrictions, increased muscle spasms, impaired flexibility, improper body mechanics, postural dysfunction, and pain.   ACTIVITY LIMITATIONS continence, locomotion level, and intercourse  PARTICIPATION LIMITATIONS: interpersonal relationship and community  activity  PERSONAL FACTORS Past/current experiences, Time since onset of injury/illness/exacerbation, and 1 comorbidity: medical history  are also affecting patient's functional outcome.   REHAB POTENTIAL: Good  CLINICAL DECISION MAKING: Stable/uncomplicated  EVALUATION COMPLEXITY: Low   GOALS: Goals reviewed with patient? Yes  SHORT TERM GOALS: Target date: 05/26/2022  Pt to be I with HEP.  Baseline: Goal status: INITIAL  2.  Pt to be I with use of feminine moisturizers and lubricants as needed for improved vaginal health and decreased pain with penetration.  Baseline:  Goal status: INITIAL  3.  Pt to demonstrated improved coordination with pelvic floor and breathing mechanics with body weight squats without leakage or pain for improved pelvic stability.  Baseline:  Goal status: INITIAL   LONG TERM GOALS: Target date:  07/29/22    Pt to be I with advanced HEP.  Baseline:  Goal status: INITIAL  2.  Pt to demonstrate at least 5/5 bil hip strength for improved pelvic stability and functional squats without leakage.  Baseline:  Goal status: INITIAL  3. Pt to demonstrate at least 3/5 pelvic floor strength for improved pelvic stability and decreased strain at pelvic floor/ decrease leakage.  Baseline:  Goal status: INITIAL  4.  Pt to report improved time between bladder voids to at least 2.5 hours for improved QOL with decreased urinary frequency.   Baseline:  Goal status: INITIAL  5.  Pt to report no more than 2/10 pain with vaginal penetration for improved QOL and decreased pain with medical procedures. Baseline:  Goal status: INITIAL    PLAN: PT FREQUENCY: 1x/week  PT DURATION:  10 sessions  PLANNED INTERVENTIONS: Therapeutic exercises, Therapeutic activity, Neuromuscular re-education, Patient/Family education, Self Care, Joint mobilization, Aquatic Therapy, Dry Needling, Spinal mobilization, Cryotherapy, Moist heat, scar mobilization, Taping, Biofeedback, and  Manual therapy  PLAN FOR NEXT SESSION: beginner core/hip strengthening with cues for pelvic  floor contraction if able, internal if needed for improved coordination of pelvic floor   Stacy Gardner, PT, DPT 09/06/235:04 PM

## 2022-06-08 ENCOUNTER — Encounter
Payer: BC Managed Care – PPO | Attending: Physical Medicine and Rehabilitation | Admitting: Physical Medicine and Rehabilitation

## 2022-06-08 VITALS — BP 138/84 | HR 73 | Ht 65.0 in | Wt 163.0 lb

## 2022-06-08 DIAGNOSIS — R232 Flushing: Secondary | ICD-10-CM | POA: Insufficient documentation

## 2022-06-08 DIAGNOSIS — G620 Drug-induced polyneuropathy: Secondary | ICD-10-CM | POA: Diagnosis present

## 2022-06-08 DIAGNOSIS — T451X5A Adverse effect of antineoplastic and immunosuppressive drugs, initial encounter: Secondary | ICD-10-CM | POA: Diagnosis present

## 2022-06-08 NOTE — Patient Instructions (Signed)
Foods that may reduce pain: 1) Ginger (especially studied for arthritis)- reduce leukotriene production to decrease inflammation 2) Blueberries- high in phytonutrients that decrease inflammation 3) Salmon- marine omega-3s reduce joint swelling and pain 4) Pumpkin seeds- reduce inflammation 5) dark chocolate- reduces inflammation 6) turmeric- reduces inflammation 7) tart cherries - reduce pain and stiffness 8) extra virgin olive oil - its compound olecanthal helps to block prostaglandins  9) chili peppers- can be eaten or applied topically via capsaicin 10) mint- helpful for headache, muscle aches, joint pain, and itching 11) garlic- reduces inflammation  Link to further information on diet for chronic pain: http://www.randall.[email or web address]   Obesity: -Consider Roobois tea daily.  -Discussed the benefits of intermittent fasting. -Discussed foods that can assist in weight loss: 1) leafy greens- high in fiber and nutrients 2) dark chocolate- improves metabolism (if prefer sweetened, best to sweeten with honey instead of sugar).  3) cruciferous vegetables- high in fiber and protein 4) full fat yogurt: high in healthy fat, protein, calcium, and probiotics 5) apples- high in a variety of phytochemicals 6) nuts- high in fiber and protein that increase feelings of fullness 7) grapefruit: rich in nutrients, antioxidants, and fiber (not to be taken with anticoagulation) 8) beans- high in protein and fiber 9) salmon- has high quality protein and healthy fats 10) green tea- rich in polyphenols 11) eggs- rich in choline and vitamin D 12) tuna- high protein, boosts metabolism 13) avocado- decreases visceral abdominal fat 14) chicken (pasture raised): high in protein and iron 15) blueberries- reduce abdominal fat and cholesterol 16) whole grains- decreases calories retained during digestion, speeds metabolism 17) chia seeds- curb  appetite 18) chilies- increases fat metabolism  -Discussed supplements that can be used:  1) Metatrim '400mg'$  BID 30 minutes before breakfast and dinner  2) Sphaeranthus indicus and Garcinia mangostana (combinations of these and #1 can be found in capsicum and zychrome  3) green coffee bean extract '400mg'$  twice per day or Irvingia (african mango) 150 to '300mg'$  twice per day.

## 2022-06-08 NOTE — Progress Notes (Signed)
Subjective:    Hannah Woodard ID: Hannah Woodard, female    DOB: 04-16-1971, 51 y.o.   MRN: [id number]  HPI Hannah Woodard is a 51 year old woman who presents for follow-up of peripheral neuropathy secondary to chemotherapy.   1) Chemotherapy induced peripheral neuropathy.  -she uses CBD oil with menthol and capsaicin and ice socks but these are sometimes too cold for her. Has not needed this as often -got 12 weeks relief from [facility] -helped this time about the same as last time -most pain was in the ball of the feet and big toes.  -she felt most benefit from the first [place] application, she felt less benefit from the last one. She has some burning the day of application but no uncomfortable burning afterward -She thinks her pain may have been a little worse because she has been active.  -she asks whether she can go for a walk today, she got a new puppy -she used oral CBD with chem -Initially pain about 8 at worst in the mornings, now down to 5/10.  -she feels traditional medicine is still not open to herbal medicine -the neuropathy started with breast cancer chemotherapy.  -pain right now is 4/10 -pain is intermittent.  -she was on Taxol and Herceptin.  -she hates that she is on any drugs -she thinks she eats healthy and she takes vitamins -pain is mostly present on dorsum of both feet, as well as balls and heels. -charlie horses improved -she gets liposomal vitamin C  2) High iron level -she was told not to take iron supplement since her level was too high.   3) Hot flashes -she takes gabapentin with these -not as bad -helps to stay away from caffeine, spicy foods, chocolate  4) Rash -she easily gets rashes   5) Poison Ivy: -she recently got while gardening.   Pain Inventory Average Pain 3 Pain Right Now 4 My pain is intermittent  LOCATION OF PAIN  feet,toes,leg,neck,thigh,fingers,back,hip,neck  BOWEL Number of stools per week: 7 Oral laxative use fiber powder Type  of laxative  Enema or suppository use No  History of colostomy No  Incontinent No   BLADDER Normal  Able to self cath No  Bladder incontinence No  Frequent urination No  Leakage with coughing No  Difficulty starting stream No  Incomplete bladder emptying Yes    Mobility walk without assistance ability to climb steps?  yes do you drive?  yes  Function employed # of hrs/week 37.5  Neuro/Psych numbness tingling  Prior Studies N/a  Physicians involved in your care N/a   Family History  Problem Relation Age of Onset   Ovarian cancer Mother 63   Prostate cancer Father 11       'high gleason' unsure number   Ulcerative colitis Sister    Other Sister        abn ovaian growth, partial hysterectomy   Basal cell carcinoma Sister 69   Uterine cancer Maternal Aunt 55   Stroke Maternal Aunt 66   Basal cell carcinoma Brother    Skin cancer Paternal Uncle    Skin cancer Maternal Grandmother    Stroke Paternal Grandfather 27   Hydrocephalus Cousin    Social History   Socioeconomic History   Marital status: Married    Spouse name: Not on file   Number of children: Not on file   Years of education: Not on file   Highest education level: Not on file  Occupational History   Occupation: Multimedia programmer  Tobacco Use   Smoking status: Never   Smokeless tobacco: Never  Vaping Use   Vaping Use: Never used  Substance and Sexual Activity   Alcohol use: Not Currently    Comment: rarely    Drug use: No   Sexual activity: Yes    Partners: Male    Birth control/protection: Other-see comments    Comment: chemical menopaus  Other Topics Concern   Not on file  Social History Narrative   Not on file   Social Determinants of Health   Financial Resource Strain: Not on file  Food Insecurity: Not on file  Transportation Needs: Not on file  Physical Activity: Not on file  Stress: Not on file  Social Connections: Not on file   Past Surgical History:  Procedure  Laterality Date   PORT-A-CATH REMOVAL Left 2020   right lumpectomy, sentinel node biopsy, and bilateral mammoplasty  10/2017   [facility]   Past Medical History:  Diagnosis Date   Breast cancer ([place])    Family history of ovarian cancer 09/02/2017   Family history of prostate cancer in father 09/02/2017   Family history of uterine cancer 09/02/2017   Plantar fasciitis    LMP 10/05/2017   Opioid Risk Score:   Fall Risk Score:  `1  Depression screen PHQ 2/9     12/09/2021    2:05 PM 09/02/2021    3:09 PM 08/26/2021    9:51 AM 12/31/2020    2:42 PM 01/27/2018    9:17 AM  Depression screen PHQ 2/9  Decreased Interest 0 0 0 0 0  Down, Depressed, Hopeless 0 0 1 0 0  PHQ - 2 Score 0 0 1 0 0  Altered sleeping   2    Tired, decreased energy   2    Change in appetite   0    Feeling bad or failure about yourself    1    Trouble concentrating   0    Moving slowly or fidgety/restless   0    Suicidal thoughts   0    PHQ-9 Score   6         Review of Systems  Constitutional:  Positive for chills, fever and unexpected weight change.  HENT: Negative.    Eyes: Negative.   Respiratory: Negative.    Cardiovascular: Negative.  Negative for chest pain.  Endocrine: Negative.   Genitourinary: Negative.   Musculoskeletal: Negative.   Skin: Negative.   Allergic/Immunologic: Negative.   Neurological: Negative.   Hematological: Negative.   Psychiatric/Behavioral: Negative.        Objective:   Physical Exam Gen: no distress, normal appearing, weight 164 lbs, BMI 27.42, BP 107/63 HEENT: oral mucosa pink and moist, NCAT Cardio: Reg rate Chest: normal effort, normal rate of breathing Abd: soft, non-distended Ext: no edema Psych: pleasant, normal affect Skin: no open lesions on feet Neuro: Alert and oriented x3.  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  -Discussed benefits of exercise in reducing pain. -continue CBD oil -commended on trying ashwagandha -Discussed Qutenza as an option for neuropathic pain control. Discussed that this is a capsaicin patch, stronger than capsaicin cream. Discussed that it is currently approved for diabetic peripheral neuropathy and post-herpetic neuralgia, but that it has also shown benefit in treating other forms of neuropathy. Provided Hannah Woodard with link to site to learn more  about the patch: CinemaBonus.fr. Discussed that the patch would be placed in office and benefits usually last 3 months. Discussed that unintended exposure to capsaicin can cause severe irritation of eyes, mucous membranes, respiratory tract, and skin, but that Qutenza is a local treatment and does not have the systemic side effects of other nerve medications. Discussed that there may be pain, itching, erythema, and decreased sensory function associated with the application of Qutenza. Side effects usually subside within 1 week. A cold pack of analgesic medications can help with these side effects. Blood pressure can also be increased due to pain associated with administration of the patch.    4 patches of Qutenza was applied to the area of pain. Ice packs were applied during the procedure to ensure Hannah Woodard comfort. Blood pressure was monitored every 15 minutes. The Hannah Woodard tolerated the procedure well. Post-procedure instructions were given and follow-up has been scheduled.     -Discussed current symptoms of pain and history of pain.  -Discussed benefits of exercise in reducing pain. -Discussed following foods that may reduce pain: 1) Ginger (especially studied for arthritis)- reduce leukotriene production to decrease inflammation 2) Blueberries- high in phytonutrients that decrease inflammation 3) Salmon- marine omega-3s reduce joint swelling and pain 4) Pumpkin seeds- reduce inflammation 5) dark chocolate- reduces  inflammation 6) turmeric- reduces inflammation 7) tart cherries - reduce pain and stiffness 8) extra virgin olive oil - its compound olecanthal helps to block prostaglandins  9) chili peppers- can be eaten or applied topically via capsaicin 10) mint- helpful for headache, muscle aches, joint pain, and itching 11) garlic- reduces inflammation  Link to further information on diet for chronic pain: http://www.randall.com/   2) Hot flashes: -continue gabapentin PRN -discussed that she is on such a low dose that she does not need to wean off.   3) Overweight: -Educated that current weight is 164 lbs and current BMI is 27.43, lost 6 lbs over last 2 visits -discussed that decreasing weight can help decrease weight on joints, and thus lessen pain.  -Educated regarding health benefits of weight loss- for pain, general health, chronic disease prevention, immune health, mental health.  -encouraged higher protein, lower bread.  -Will monitor weight every visit.  -Consider Roobois tea daily.  -Discussed the benefits of intermittent fasting. -Discussed foods that can assist in weight loss: 1) leafy greens- high in fiber and nutrients 2) dark chocolate- improves metabolism (if prefer sweetened, best to sweeten with honey instead of sugar).  3) cruciferous vegetables- high in fiber and protein 4) full fat yogurt: high in healthy fat, protein, calcium, and probiotics 5) apples- high in a variety of phytochemicals 6) nuts- high in fiber and protein that increase feelings of fullness 7) grapefruit: rich in nutrients, antioxidants, and fiber (not to be taken with anticoagulation) 8) beans- high in protein and fiber 9) salmon- has high quality protein and healthy fats 10) green tea- rich in polyphenols 11) eggs- rich in choline and vitamin D 12) tuna- high protein, boosts metabolism 13) avocado- decreases visceral abdominal fat 14)  chicken (pasture raised): high in protein and iron 15) blueberries- reduce abdominal fat and cholesterol 16) whole grains- decreases calories retained during digestion, speeds metabolism 17) chia seeds- curb appetite 18) chilies- increases fat metabolism  -Discussed supplements that can be used:  1) Metatrim '400mg'$  BID 30 minutes before breakfast and dinner  2) Sphaeranthus indicus and Garcinia mangostana (combinations of these and #1 can be found in capsicum and zychrome  3) green coffee bean extract  $'400mg'X$  twice per day or Irvingia (african mango) 150 to '300mg'$  twice per day.

## 2022-06-11 ENCOUNTER — Ambulatory Visit: Payer: BC Managed Care – PPO | Admitting: Physical Therapy

## 2022-06-11 DIAGNOSIS — M6281 Muscle weakness (generalized): Secondary | ICD-10-CM

## 2022-06-11 DIAGNOSIS — R293 Abnormal posture: Secondary | ICD-10-CM

## 2022-06-11 DIAGNOSIS — R279 Unspecified lack of coordination: Secondary | ICD-10-CM

## 2022-06-11 NOTE — Patient Instructions (Signed)

## 2022-06-11 NOTE — Therapy (Signed)
OUTPATIENT PHYSICAL THERAPY FEMALE PELVIC TREATMENT   Patient Name: Hannah Woodard MRN: [id number] DOB:[birth year], 51 y.o., female Today's Date: 06/11/2022   PT End of Session - 06/11/22 1611     Visit Number 6    Date for PT Re-Evaluation 07/29/22    Authorization Type BCBS    PT Start Time 1530    PT Stop Time 1610    PT Time Calculation (min) 40 min    Activity Tolerance Patient tolerated treatment well    Behavior During Therapy [facility] for tasks assessed/performed              Past Medical History:  Diagnosis Date   Breast cancer ([place])    Family history of ovarian cancer 09/02/2017   Family history of prostate cancer in father 09/02/2017   Family history of uterine cancer 09/02/2017   Plantar fasciitis    Past Surgical History:  Procedure Laterality Date   PORT-A-CATH REMOVAL Left 2020   right lumpectomy, sentinel node biopsy, and bilateral mammoplasty  10/2017   [facility]   Patient Active Problem List   Diagnosis Date Noted   Overweight (BMI 25.0-29.9) 12/31/2020   Hyperlipidemia 12/30/2020   Neuropathy due to chemotherapeutic drug ([place]) 12/31/2017   Port-A-Cath in place 11/12/2017   Genetic testing 09/15/2017   Malignant neoplasm of upper-inner quadrant of right breast in female, estrogen receptor positive ([place]) 08/27/2017   Vitamin D deficiency 12/09/2015   Medication management 12/09/2015   Dense breast tissue 12/09/2015    PCP: Hayden Rasmussen, MD  REFERRING PROVIDER: Dorothyann Gibbs, NP  REFERRING DIAG:  R35.0 (ICD-10-CM) - Urinary frequency N94.10 (ICD-10-CM) - Dyspareunia in female T8.6X5A (ICD-10-CM) - Adverse effect of antigonadotrophins, antiestrogens, antiandrogens, not elsewhere classified, initial encounter  THERAPY DIAG:  Muscle weakness (generalized)  Unspecified lack of coordination  Abnormal posture  Rationale for Evaluation and Treatment Rehabilitation  ONSET DATE: years  SUBJECTIVE:                                                                                                                                                                                            SUBJECTIVE STATEMENT: Pt reports she does feel like she is getting a cold. Has been doing HEP more regularly and notices improvement with pain, has not had tailbone pain. Urinary urgency has been slightly better and now making it longer through the night into the morning without having to get up to urinate.   Fluid intake: Yes: 60 oz water, tea sometimes but not regular       PAIN:  Are you having pain? Yes NPRS scale: 2/10 Pain location:  lt  hip/low back.   Pain type: tight Pain description: intermittent   Aggravating factors: walking, sitting for long time, tight pants, intercourse Relieving factors: rest  PRECAUTIONS: None  WEIGHT BEARING RESTRICTIONS No  FALLS:  Has patient fallen in last 6 months? 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  PLOF: Independent  PATIENT GOALS to have less pain, more regular voids  PERTINENT HISTORY:  personal history of triple positive breast cancer at age 12 and family history of ovarian cancer, BRCA negative on genetic testing Vaginal atrophy Malignant neoplasm of upper-inner quadrant of right breast in female, estrogen receptor positive   Sexual abuse: No  BOWEL MOVEMENT Pain with bowel movement: No Type of bowel movement:Type (Bristol Stool Scale) 2-3, Frequency daily, and Strain Yes but this has improved since starting using step stool in bathroom Fully empty rectum: No Leakage: No Pads: No Fiber supplement: Yes:    URINATION Pain with urination: No sometimes, rarely will have a burning Fully empty bladder: No Stream: Strong and Weak Urgency: Yes: consistently Frequency: 2 hours sometimes sooner, usually gets up at night 1x Leakage: Coughing and Sneezing Pads: No  INTERCOURSE Pain  with intercourse: Initial Penetration, During Penetration, After Intercourse, and Pain Interrupts Intercourse Ability to have vaginal penetration:  Yes:   Climax: not painful but pain prevents climax sometimes Marinoff Scale: 2/3  PREGNANCY Vaginal deliveries 0 Tearing No C-section deliveries 0 Currently pregnant No  PROLAPSE None    OBJECTIVE:   DIAGNOSTIC FINDINGS:   COGNITION:  Overall cognitive status: Within functional limits for tasks assessed     SENSATION:  Light touch: Appears intact   Proprioception: Appears intact  MUSCLE LENGTH: Bil hamstring and adductor limited by 25%                POSTURE: rounded shoulders and forward head    LUMBARAROM/PROM  A/PROM A/PROM  eval  Flexion Limited by 25%  Extension WFL  Right lateral flexion Limited by 25%  Left lateral flexion Limited by 25%  Right rotation WFL  Left rotation WFL   (Blank rows = not tested)  LOWER EXTREMITY ROM:  Bil WFL LOWER EXTREMITY MMT:   Bil hips 4/5 grossly, knees and ankles 5/5   PALPATION:   General  no TTP, mild fascial restrictions noted in lower abdominal quadrants and over bladder                External Perineal Exam no TTP, dryness noted externally                             Internal Pelvic Floor no TTP however increased tension noted throughout  Patient confirms identification and approves PT to assess internal pelvic floor and treatment Yes  PELVIC MMT:   MMT eval 06/03/22   Vaginal 1/5 with max cues and quick release; without cues 0/5; 1s; 1 rep 1/5 initially but with quick release 2/5.    Internal Anal Sphincter    External Anal Sphincter    Puborectalis    Diastasis Recti    (Blank rows = not tested)        TONE: Mildly increased  PROLAPSE: Not seen in hooklying   TODAY'S TREATMENT   06/11/2022: Hamstring stretch 2x30s each Piriformis 2x30s Butterfly stretch 2x30s Cat/cow x10 Ardine Eng pose 2x30s Side bending childs pose 2x30s    PATIENT  EDUCATION:  Education details: [id number],  urge drill  Person educated: Patient Education method: Explanation, Demonstration, Tactile cues, Verbal cues, and Handouts Education comprehension: verbalized understanding and returned demonstration   HOME EXERCISE PROGRAM: [id number]  ASSESSMENT:  CLINICAL IMPRESSION: Patient reports she feels everything is getting better in general and pleased with this. Pt session focused on hip and back stretching, declined internal work today. Pt reported feeling better at end of session, tolerated well. Pt would benefit from additional PT to further address deficits.     OBJECTIVE IMPAIRMENTS decreased coordination, decreased endurance, decreased mobility, decreased strength, increased fascial restrictions, increased muscle spasms, impaired flexibility, improper body mechanics, postural dysfunction, and pain.   ACTIVITY LIMITATIONS continence, locomotion level, and intercourse  PARTICIPATION LIMITATIONS: interpersonal relationship and community activity  PERSONAL FACTORS Past/current experiences, Time since onset of injury/illness/exacerbation, and 1 comorbidity: medical history  are also affecting patient's functional outcome.   REHAB POTENTIAL: Good  CLINICAL DECISION MAKING: Stable/uncomplicated  EVALUATION COMPLEXITY: Low   GOALS: Goals reviewed with patient? Yes  SHORT TERM GOALS: Target date: 05/26/2022  Pt to be I with HEP.  Baseline: Goal status: MET  2.  Pt to be I with use of feminine moisturizers and lubricants as needed for improved vaginal health and decreased pain with penetration.  Baseline:  Goal status: MET  3.  Pt to demonstrated improved coordination with pelvic floor and breathing mechanics with body weight squats without leakage or pain for improved pelvic stability.  Baseline:  Goal status: MET   LONG TERM GOALS: Target date:  07/29/22    Pt to be I with advanced HEP.  Baseline:  Goal status: INITIAL  2.  Pt to  demonstrate at least 5/5 bil hip strength for improved pelvic stability and functional squats without leakage.  Baseline:  Goal status: INITIAL  3. Pt to demonstrate at least 3/5 pelvic floor strength for improved pelvic stability and decreased strain at pelvic floor/ decrease leakage.  Baseline:  Goal status: INITIAL  4.  Pt to report improved time between bladder voids to at least 2.5 hours for improved QOL with decreased urinary frequency.   Baseline:  Goal status: INITIAL  5.  Pt to report no more than 2/10 pain with vaginal penetration for improved QOL and decreased pain with medical procedures. Baseline:  Goal status: INITIAL    PLAN: PT FREQUENCY: 1x/week  PT DURATION:  10 sessions  PLANNED INTERVENTIONS: Therapeutic exercises, Therapeutic activity, Neuromuscular re-education, Patient/Family education, Self Care, Joint mobilization, Aquatic Therapy, Dry Needling, Spinal mobilization, Cryotherapy, Moist heat, scar mobilization, Taping, Biofeedback, and Manual therapy  PLAN FOR NEXT SESSION: beginner core/hip strengthening with cues for pelvic floor contraction if able, internal if needed for improved coordination of pelvic floor   Stacy Gardner, PT, DPT 09/14/234:12 PM

## 2022-06-22 ENCOUNTER — Other Ambulatory Visit: Payer: Self-pay | Admitting: *Deleted

## 2022-06-22 DIAGNOSIS — C50211 Malignant neoplasm of upper-inner quadrant of right female breast: Secondary | ICD-10-CM

## 2022-06-22 DIAGNOSIS — G620 Drug-induced polyneuropathy: Secondary | ICD-10-CM

## 2022-06-22 MED ORDER — GABAPENTIN 300 MG PO CAPS: 300.0000 mg | ORAL_CAPSULE | Freq: Every day | ORAL | 4 refills | Status: DC

## 2022-06-23 ENCOUNTER — Ambulatory Visit: Payer: BC Managed Care – PPO | Admitting: Physical Therapy

## 2022-06-23 DIAGNOSIS — M6281 Muscle weakness (generalized): Secondary | ICD-10-CM

## 2022-06-23 DIAGNOSIS — R293 Abnormal posture: Secondary | ICD-10-CM

## 2022-06-23 DIAGNOSIS — R279 Unspecified lack of coordination: Secondary | ICD-10-CM

## 2022-06-23 NOTE — Therapy (Signed)
OUTPATIENT PHYSICAL THERAPY FEMALE PELVIC TREATMENT   Patient Name: Hannah Woodard MRN: [id number] DOB:1970/11/05, 51 y.o., female Today's Date: 06/23/2022   PT End of Session - 06/23/22 1622     Visit Number 7    Date for PT Re-Evaluation 07/29/22    Authorization Type BCBS    PT Start Time 1617    PT Stop Time 1700    PT Time Calculation (min) 43 min    Activity Tolerance Patient tolerated treatment well    Behavior During Therapy [facility] for tasks assessed/performed              Past Medical History:  Diagnosis Date   Breast cancer ([place])    Family history of ovarian cancer 09/02/2017   Family history of prostate cancer in father 09/02/2017   Family history of uterine cancer 09/02/2017   Plantar fasciitis    Past Surgical History:  Procedure Laterality Date   PORT-A-CATH REMOVAL Left 2020   right lumpectomy, sentinel node biopsy, and bilateral mammoplasty  10/2017   [facility]   Patient Active Problem List   Diagnosis Date Noted   Overweight (BMI 25.0-29.9) 12/31/2020   Hyperlipidemia 12/30/2020   Neuropathy due to chemotherapeutic drug ([place]) 12/31/2017   Port-A-Cath in place 11/12/2017   Genetic testing 09/15/2017   Malignant neoplasm of upper-inner quadrant of right breast in female, estrogen receptor positive ([place]) 08/27/2017   Vitamin D deficiency 12/09/2015   Medication management 12/09/2015   Dense breast tissue 12/09/2015    PCP: Hayden Rasmussen, MD  REFERRING PROVIDER: Dorothyann Gibbs, NP  REFERRING DIAG:  R35.0 (ICD-10-CM) - Urinary frequency N94.10 (ICD-10-CM) - Dyspareunia in female T89.6X5A (ICD-10-CM) - Adverse effect of antigonadotrophins, antiestrogens, antiandrogens, not elsewhere classified, initial encounter  THERAPY DIAG:  Muscle weakness (generalized)  Abnormal posture  Unspecified lack of coordination  Rationale for Evaluation and Treatment Rehabilitation  ONSET DATE: years  SUBJECTIVE:                                                                                                                                                                                            SUBJECTIVE STATEMENT: Pt "feels dryer this week". Urinating has been painful a little for the last 2 days as well.   Fluid intake: Yes: 60 oz water, tea sometimes but not regular       PAIN:  Are you having pain? Yes NPRS scale: 3/10 Pain location:  with urinating.    PRECAUTIONS: None  WEIGHT BEARING RESTRICTIONS No  FALLS:  Has patient fallen in last 6 months? 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  PLOF: Independent  PATIENT GOALS to have less pain, more regular voids  PERTINENT HISTORY:  personal history of triple positive breast cancer at age 16 and family history of ovarian cancer, BRCA negative on genetic testing Vaginal atrophy Malignant neoplasm of upper-inner quadrant of right breast in female, estrogen receptor positive   Sexual abuse: No  BOWEL MOVEMENT Pain with bowel movement: No Type of bowel movement:Type (Bristol Stool Scale) 2-3, Frequency daily, and Strain Yes but this has improved since starting using step stool in bathroom Fully empty rectum: No Leakage: No Pads: No Fiber supplement: Yes:    URINATION Pain with urination: No sometimes, rarely will have a burning Fully empty bladder: No Stream: Strong and Weak Urgency: Yes: consistently Frequency: 2 hours sometimes sooner, usually gets up at night 1x Leakage: Coughing and Sneezing Pads: No  INTERCOURSE Pain with intercourse: Initial Penetration, During Penetration, After Intercourse, and Pain Interrupts Intercourse Ability to have vaginal penetration:  Yes:   Climax: not painful but pain prevents climax sometimes Marinoff Scale: 2/3  PREGNANCY Vaginal deliveries 0 Tearing No C-section deliveries 0 Currently pregnant  No  PROLAPSE None    OBJECTIVE:   DIAGNOSTIC FINDINGS:   COGNITION:  Overall cognitive status: Within functional limits for tasks assessed     SENSATION:  Light touch: Appears intact   Proprioception: Appears intact  MUSCLE LENGTH: Bil hamstring and adductor limited by 25%                POSTURE: rounded shoulders and forward head    LUMBARAROM/PROM  A/PROM A/PROM  eval  Flexion Limited by 25%  Extension WFL  Right lateral flexion Limited by 25%  Left lateral flexion Limited by 25%  Right rotation WFL  Left rotation WFL   (Blank rows = not tested)  LOWER EXTREMITY ROM:  Bil WFL LOWER EXTREMITY MMT:   Bil hips 4/5 grossly, knees and ankles 5/5   PALPATION:   General  no TTP, mild fascial restrictions noted in lower abdominal quadrants and over bladder                External Perineal Exam no TTP, dryness noted externally                             Internal Pelvic Floor no TTP however increased tension noted throughout  Patient confirms identification and approves PT to assess internal pelvic floor and treatment Yes  PELVIC MMT:   MMT eval 06/03/22   Vaginal 1/5 with max cues and quick release; without cues 0/5; 1s; 1 rep 1/5 initially but with quick release 2/5.    Internal Anal Sphincter    External Anal Sphincter    Puborectalis    Diastasis Recti    (Blank rows = not tested)        TONE: Mildly increased  PROLAPSE: Not seen in hooklying   TODAY'S TREATMENT   06/23/2022: Manual:  Internal vaginal treatment with gentle over pressure at bil obturator internus with TTP here and noted tightness. Pt tolerated well and reported less tenderness post treatment with sight release but not complete.  Self care: decreased glute clenching and improved posture   06/11/2022: Hamstring stretch 2x30s each Piriformis 2x30s Butterfly stretch 2x30s Cat/cow x10 Ardine Eng pose 2x30s Side bending childs pose 2x30s    PATIENT EDUCATION:  Education details:  [id number], urge drill  Person educated: Patient Education method: Explanation,  Demonstration, Tactile cues, Verbal cues, and Handouts Education comprehension: verbalized understanding and returned demonstration   HOME EXERCISE PROGRAM: [id number]  ASSESSMENT:  CLINICAL IMPRESSION: Patient session focused on internal vaginal treatment with pt consent today, no pain with insertion of one gloved digit, no TTP with superficial muscle layers, but did have TTP at bil obturator internus. Pt improved compared to last internal session with less global tension and no superficial pain or tension. Session progressed to deeper layers with minimal pain reported by pt and tolerated well. Pt would benefit from additional PT to further address deficits.     OBJECTIVE IMPAIRMENTS decreased coordination, decreased endurance, decreased mobility, decreased strength, increased fascial restrictions, increased muscle spasms, impaired flexibility, improper body mechanics, postural dysfunction, and pain.   ACTIVITY LIMITATIONS continence, locomotion level, and intercourse  PARTICIPATION LIMITATIONS: interpersonal relationship and community activity  PERSONAL FACTORS Past/current experiences, Time since onset of injury/illness/exacerbation, and 1 comorbidity: medical history  are also affecting patient's functional outcome.   REHAB POTENTIAL: Good  CLINICAL DECISION MAKING: Stable/uncomplicated  EVALUATION COMPLEXITY: Low   GOALS: Goals reviewed with patient? Yes  SHORT TERM GOALS: Target date: 05/26/2022  Pt to be I with HEP.  Baseline: Goal status: MET  2.  Pt to be I with use of feminine moisturizers and lubricants as needed for improved vaginal health and decreased pain with penetration.  Baseline:  Goal status: MET  3.  Pt to demonstrated improved coordination with pelvic floor and breathing mechanics with body weight squats without leakage or pain for improved pelvic stability.  Baseline:  Goal  status: MET   LONG TERM GOALS: Target date:  07/29/22    Pt to be I with advanced HEP.  Baseline:  Goal status: INITIAL  2.  Pt to demonstrate at least 5/5 bil hip strength for improved pelvic stability and functional squats without leakage.  Baseline:  Goal status: INITIAL  3. Pt to demonstrate at least 3/5 pelvic floor strength for improved pelvic stability and decreased strain at pelvic floor/ decrease leakage.  Baseline:  Goal status: INITIAL  4.  Pt to report improved time between bladder voids to at least 2.5 hours for improved QOL with decreased urinary frequency.   Baseline:  Goal status: INITIAL  5.  Pt to report no more than 2/10 pain with vaginal penetration for improved QOL and decreased pain with medical procedures. Baseline:  Goal status: INITIAL    PLAN: PT FREQUENCY: 1x/week  PT DURATION:  10 sessions  PLANNED INTERVENTIONS: Therapeutic exercises, Therapeutic activity, Neuromuscular re-education, Patient/Family education, Self Care, Joint mobilization, Aquatic Therapy, Dry Needling, Spinal mobilization, Cryotherapy, Moist heat, scar mobilization, Taping, Biofeedback, and Manual therapy  PLAN FOR NEXT SESSION: beginner core/hip strengthening with cues for pelvic floor contraction if able, internal if needed for improved coordination of pelvic floor   Stacy Gardner, PT, DPT 09/26/235:08 PM

## 2022-07-02 ENCOUNTER — Ambulatory Visit: Payer: BC Managed Care – PPO | Attending: Gynecologic Oncology | Admitting: Physical Therapy

## 2022-07-02 DIAGNOSIS — R279 Unspecified lack of coordination: Secondary | ICD-10-CM | POA: Diagnosis present

## 2022-07-02 DIAGNOSIS — R293 Abnormal posture: Secondary | ICD-10-CM | POA: Diagnosis present

## 2022-07-02 DIAGNOSIS — M62838 Other muscle spasm: Secondary | ICD-10-CM | POA: Insufficient documentation

## 2022-07-02 DIAGNOSIS — M6281 Muscle weakness (generalized): Secondary | ICD-10-CM | POA: Diagnosis not present

## 2022-07-02 NOTE — Therapy (Signed)
OUTPATIENT PHYSICAL THERAPY FEMALE PELVIC TREATMENT   Patient Name: Hannah Woodard MRN: [id number] DOB:Sep 28, 1971, 51 y.o., female Today's Date: 07/02/2022   PT End of Session - 07/02/22 1019     Visit Number 8    Date for PT Re-Evaluation 07/29/22    Authorization Type BCBS    PT Start Time 1016    PT Stop Time 1058    PT Time Calculation (min) 42 min    Activity Tolerance Patient tolerated treatment well    Behavior During Therapy [facility] for tasks assessed/performed              Past Medical History:  Diagnosis Date   Breast cancer ([place])    Family history of ovarian cancer 09/02/2017   Family history of prostate cancer in father 09/02/2017   Family history of uterine cancer 09/02/2017   Plantar fasciitis    Past Surgical History:  Procedure Laterality Date   PORT-A-CATH REMOVAL Left 2020   right lumpectomy, sentinel node biopsy, and bilateral mammoplasty  10/2017   [facility]   Patient Active Problem List   Diagnosis Date Noted   Overweight (BMI 25.0-29.9) 12/31/2020   Hyperlipidemia 12/30/2020   Neuropathy due to chemotherapeutic drug ([place]) 12/31/2017   Port-A-Cath in place 11/12/2017   Genetic testing 09/15/2017   Malignant neoplasm of upper-inner quadrant of right breast in female, estrogen receptor positive ([place]) 08/27/2017   Vitamin D deficiency 12/09/2015   Medication management 12/09/2015   Dense breast tissue 12/09/2015    PCP: Hayden Rasmussen, MD  REFERRING PROVIDER: Dorothyann Gibbs, NP  REFERRING DIAG:  R35.0 (ICD-10-CM) - Urinary frequency N94.10 (ICD-10-CM) - Dyspareunia in female T19.6X5A (ICD-10-CM) - Adverse effect of antigonadotrophins, antiestrogens, antiandrogens, not elsewhere classified, initial encounter  THERAPY DIAG:  Muscle weakness (generalized)  Unspecified lack of coordination  Abnormal posture  Rationale for Evaluation and Treatment Rehabilitation  ONSET DATE: years  SUBJECTIVE:                                                                                                                                                                                            SUBJECTIVE STATEMENT: Pt reports urination has been slightly better, "I need to be better about trying the urge drill." Pt reports frequency has been improving but night time has been about the same.   Fluid intake: Yes: 60 oz water, tea sometimes but not regular       PAIN:  Are you having pain? Yes NPRS scale: 3/10 Pain location:  with urinating.    PRECAUTIONS: None  WEIGHT BEARING RESTRICTIONS No  FALLS:  Has patient fallen in last 6  months? 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  PLOF: Independent  PATIENT GOALS to have less pain, more regular voids  PERTINENT HISTORY:  personal history of triple positive breast cancer at age 69 and family history of ovarian cancer, BRCA negative on genetic testing Vaginal atrophy Malignant neoplasm of upper-inner quadrant of right breast in female, estrogen receptor positive   Sexual abuse: No  BOWEL MOVEMENT Pain with bowel movement: No Type of bowel movement:Type (Bristol Stool Scale) 2-3, Frequency daily, and Strain Yes but this has improved since starting using step stool in bathroom Fully empty rectum: No Leakage: No Pads: No Fiber supplement: Yes:    URINATION Pain with urination: No sometimes, rarely will have a burning Fully empty bladder: No Stream: Strong and Weak Urgency: Yes: consistently Frequency: 2 hours sometimes sooner, usually gets up at night 1x Leakage: Coughing and Sneezing Pads: No  INTERCOURSE Pain with intercourse: Initial Penetration, During Penetration, After Intercourse, and Pain Interrupts Intercourse Ability to have vaginal penetration:  Yes:   Climax: not painful but pain prevents climax sometimes Marinoff Scale: 2/3  PREGNANCY Vaginal deliveries  0 Tearing No C-section deliveries 0 Currently pregnant No  PROLAPSE None    OBJECTIVE:   DIAGNOSTIC FINDINGS:   COGNITION:  Overall cognitive status: Within functional limits for tasks assessed     SENSATION:  Light touch: Appears intact   Proprioception: Appears intact  MUSCLE LENGTH: Bil hamstring and adductor limited by 25%                POSTURE: rounded shoulders and forward head    LUMBARAROM/PROM  A/PROM A/PROM  eval  Flexion Limited by 25%  Extension WFL  Right lateral flexion Limited by 25%  Left lateral flexion Limited by 25%  Right rotation WFL  Left rotation WFL   (Blank rows = not tested)  LOWER EXTREMITY ROM:  Bil WFL LOWER EXTREMITY MMT:   Bil hips 4/5 grossly, knees and ankles 5/5   PALPATION:   General  no TTP, mild fascial restrictions noted in lower abdominal quadrants and over bladder                External Perineal Exam no TTP, dryness noted externally                             Internal Pelvic Floor no TTP however increased tension noted throughout  Patient confirms identification and approves PT to assess internal pelvic floor and treatment Yes  PELVIC MMT:   MMT eval 06/03/22   Vaginal 1/5 with max cues and quick release; without cues 0/5; 1s; 1 rep 1/5 initially but with quick release 2/5.    Internal Anal Sphincter    External Anal Sphincter    Puborectalis    Diastasis Recti    (Blank rows = not tested)        TONE: Mildly increased  PROLAPSE: Not seen in hooklying   TODAY'S TREATMENT   07/02/2022: Bridges 2x10 Hip flexion red loop in hooklying 2x10 Hip abduction red loop 2x10  Quad alt shoulder flexion with TA activations 10 each Seated opp hand/knee press x10   06/23/2022: Manual:  Internal vaginal treatment with gentle over pressure at bil obturator internus with TTP here and noted tightness. Pt tolerated well and reported less tenderness post treatment with sight release but not complete.  Self care:  decreased  glute clenching and improved posture      PATIENT EDUCATION:  Education details: [id number], urge drill  Person educated: Patient Education method: Explanation, Demonstration, Tactile cues, Verbal cues, and Handouts Education comprehension: verbalized understanding and returned demonstration   HOME EXERCISE PROGRAM: [id number]  ASSESSMENT:  CLINICAL IMPRESSION: Patient session focused on core and hip strength initiations with cues for TA activation, breathing mechanics, and overall technique. Pt tolerated well, did report some back soreness but with core activation during activity pt reports this was not bothering her. Pt tolerated well with beginner gentle strengthening. Pt would benefit from additional PT to further address deficits.     OBJECTIVE IMPAIRMENTS decreased coordination, decreased endurance, decreased mobility, decreased strength, increased fascial restrictions, increased muscle spasms, impaired flexibility, improper body mechanics, postural dysfunction, and pain.   ACTIVITY LIMITATIONS continence, locomotion level, and intercourse  PARTICIPATION LIMITATIONS: interpersonal relationship and community activity  PERSONAL FACTORS Past/current experiences, Time since onset of injury/illness/exacerbation, and 1 comorbidity: medical history  are also affecting patient's functional outcome.   REHAB POTENTIAL: Good  CLINICAL DECISION MAKING: Stable/uncomplicated  EVALUATION COMPLEXITY: Low   GOALS: Goals reviewed with patient? Yes  SHORT TERM GOALS: Target date: 05/26/2022  Pt to be I with HEP.  Baseline: Goal status: MET  2.  Pt to be I with use of feminine moisturizers and lubricants as needed for improved vaginal health and decreased pain with penetration.  Baseline:  Goal status: MET  3.  Pt to demonstrated improved coordination with pelvic floor and breathing mechanics with body weight squats without leakage or pain for improved pelvic stability.   Baseline:  Goal status: MET   LONG TERM GOALS: Target date:  07/29/22    Pt to be I with advanced HEP.  Baseline:  Goal status: INITIAL  2.  Pt to demonstrate at least 5/5 bil hip strength for improved pelvic stability and functional squats without leakage.  Baseline:  Goal status: INITIAL  3. Pt to demonstrate at least 3/5 pelvic floor strength for improved pelvic stability and decreased strain at pelvic floor/ decrease leakage.  Baseline:  Goal status: INITIAL  4.  Pt to report improved time between bladder voids to at least 2.5 hours for improved QOL with decreased urinary frequency.   Baseline:  Goal status: INITIAL  5.  Pt to report no more than 2/10 pain with vaginal penetration for improved QOL and decreased pain with medical procedures. Baseline:  Goal status: INITIAL    PLAN: PT FREQUENCY: 1x/week  PT DURATION:  10 sessions  PLANNED INTERVENTIONS: Therapeutic exercises, Therapeutic activity, Neuromuscular re-education, Patient/Family education, Self Care, Joint mobilization, Aquatic Therapy, Dry Needling, Spinal mobilization, Cryotherapy, Moist heat, scar mobilization, Taping, Biofeedback, and Manual therapy  PLAN FOR NEXT SESSION: beginner core/hip strengthening with cues for pelvic floor contraction if able, internal if needed for improved coordination of pelvic floor   Stacy Gardner, PT, DPT 07/02/2309:59 AM

## 2022-07-06 ENCOUNTER — Ambulatory Visit: Payer: BC Managed Care – PPO | Admitting: Physical Therapy

## 2022-07-06 DIAGNOSIS — M6281 Muscle weakness (generalized): Secondary | ICD-10-CM

## 2022-07-06 DIAGNOSIS — R279 Unspecified lack of coordination: Secondary | ICD-10-CM

## 2022-07-06 NOTE — Therapy (Signed)
OUTPATIENT PHYSICAL THERAPY FEMALE PELVIC TREATMENT   Patient Name: JIMMYE WISNIESKI MRN: [id number] DOB:10-16-1970, 51 y.o., female Today's Date: 07/06/2022   PT End of Session - 07/06/22 0809     Visit Number 9    Date for PT Re-Evaluation 07/29/22    Authorization Type BCBS    PT Start Time 0807   pt arrival time   PT Stop Time 0845    PT Time Calculation (min) 38 min    Activity Tolerance Patient tolerated treatment well    Behavior During Therapy [facility] for tasks assessed/performed              Past Medical History:  Diagnosis Date   Breast cancer ([place])    Family history of ovarian cancer 09/02/2017   Family history of prostate cancer in father 09/02/2017   Family history of uterine cancer 09/02/2017   Plantar fasciitis    Past Surgical History:  Procedure Laterality Date   PORT-A-CATH REMOVAL Left 2020   right lumpectomy, sentinel node biopsy, and bilateral mammoplasty  10/2017   [facility]   Patient Active Problem List   Diagnosis Date Noted   Overweight (BMI 25.0-29.9) 12/31/2020   Hyperlipidemia 12/30/2020   Neuropathy due to chemotherapeutic drug ([place]) 12/31/2017   Port-A-Cath in place 11/12/2017   Genetic testing 09/15/2017   Malignant neoplasm of upper-inner quadrant of right breast in female, estrogen receptor positive ([place]) 08/27/2017   Vitamin D deficiency 12/09/2015   Medication management 12/09/2015   Dense breast tissue 12/09/2015    PCP: Hayden Rasmussen, MD  REFERRING PROVIDER: Dorothyann Gibbs, NP  REFERRING DIAG:  R35.0 (ICD-10-CM) - Urinary frequency N94.10 (ICD-10-CM) - Dyspareunia in female T29.6X5A (ICD-10-CM) - Adverse effect of antigonadotrophins, antiestrogens, antiandrogens, not elsewhere classified, initial encounter  THERAPY DIAG:  Muscle weakness (generalized)  Unspecified lack of coordination  Rationale for Evaluation and Treatment Rehabilitation  ONSET DATE: years  SUBJECTIVE:                                                                                                                                                                                            SUBJECTIVE STATEMENT: Pt reports she has not been able to do use her dilator and has not had intercourse so unsure if this is the same.   Fluid intake: Yes: 60 oz water, tea sometimes but not regular       PAIN:  Are you having pain? No NPRS scale: 0/10 Pain location:      PRECAUTIONS: None  WEIGHT BEARING RESTRICTIONS No  FALLS:  Has patient fallen in last 6 months? 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  PLOF: Independent  PATIENT GOALS to have less pain, more regular voids  PERTINENT HISTORY:  personal history of triple positive breast cancer at age 89 and family history of ovarian cancer, BRCA negative on genetic testing Vaginal atrophy Malignant neoplasm of upper-inner quadrant of right breast in female, estrogen receptor positive   Sexual abuse: No  BOWEL MOVEMENT Pain with bowel movement: No Type of bowel movement:Type (Bristol Stool Scale) 2-3, Frequency daily, and Strain Yes but this has improved since starting using step stool in bathroom Fully empty rectum: No Leakage: No Pads: No Fiber supplement: Yes:    URINATION Pain with urination: No sometimes, rarely will have a burning Fully empty bladder: No Stream: Strong and Weak Urgency: Yes: consistently Frequency: 2 hours sometimes sooner, usually gets up at night 1x Leakage: Coughing and Sneezing Pads: No  INTERCOURSE Pain with intercourse: Initial Penetration, During Penetration, After Intercourse, and Pain Interrupts Intercourse Ability to have vaginal penetration:  Yes:   Climax: not painful but pain prevents climax sometimes Marinoff Scale: 2/3  PREGNANCY Vaginal deliveries 0 Tearing No C-section deliveries 0 Currently pregnant  No  PROLAPSE None    OBJECTIVE:   DIAGNOSTIC FINDINGS:   COGNITION:  Overall cognitive status: Within functional limits for tasks assessed     SENSATION:  Light touch: Appears intact   Proprioception: Appears intact  MUSCLE LENGTH: Bil hamstring and adductor limited by 25%                POSTURE: rounded shoulders and forward head    LUMBARAROM/PROM  A/PROM A/PROM  eval  Flexion Limited by 25%  Extension WFL  Right lateral flexion Limited by 25%  Left lateral flexion Limited by 25%  Right rotation WFL  Left rotation WFL   (Blank rows = not tested)  LOWER EXTREMITY ROM:  Bil WFL LOWER EXTREMITY MMT:   Bil hips 4/5 grossly, knees and ankles 5/5   PALPATION:   General  no TTP, mild fascial restrictions noted in lower abdominal quadrants and over bladder                External Perineal Exam no TTP, dryness noted externally                             Internal Pelvic Floor no TTP however increased tension noted throughout  Patient confirms identification and approves PT to assess internal pelvic floor and treatment Yes  PELVIC MMT:   MMT eval 06/03/22  07/06/22   Vaginal 1/5 with max cues and quick release; without cues 0/5; 1s; 1 rep 1/5 initially but with quick release 2/5.   2/5 with minimal cues  Internal Anal Sphincter     External Anal Sphincter     Puborectalis     Diastasis Recti     (Blank rows = not tested)        TONE: Mildly increased  PROLAPSE: Not seen in hooklying   TODAY'S TREATMENT   07/06/2022:  Pt consented to internal vaginal treatment:  Slightly improved with contraction strength at pelvic floor with much less cuing.  Focus of treatment was decreased tension at bil pelvic floor and improved mobility with less pain for intercourse. Pt had less tension throughout, did have two tension at bil obturator internus with trigger points noted here, all released with gentle over pressure. Pt reports slight TTP but  improved since last  session.   07/02/2022: Bridges 2x10 Hip flexion red loop in hooklying 2x10 Hip abduction red loop 2x10  Quad alt shoulder flexion with TA activations 10 each Seated opp hand/knee press x10      PATIENT EDUCATION:  Education details: [id number], urge drill  Person educated: Patient Education method: Explanation, Demonstration, Tactile cues, Verbal cues, and Handouts Education comprehension: verbalized understanding and returned demonstration   HOME EXERCISE PROGRAM: [id number]  ASSESSMENT:  CLINICAL IMPRESSION: Patient session focused on internal vaginal treatment and decreasing tension for decreased pain with intercourse. Pt reports she has not had vaginal penetration recently but has been working on relaxation and decreased glute clenching. Pt had improved muscle lengthening throughout pelvic floor with less trigger points and areas of tension throughout and less TTP. Pt also given bladder diary to self monitor urine frequency as if this has improved as pt thinks it has, focus of treatment will be for decreased pain with penetration deficits. Pt would benefit from additional PT to further address deficits.     OBJECTIVE IMPAIRMENTS decreased coordination, decreased endurance, decreased mobility, decreased strength, increased fascial restrictions, increased muscle spasms, impaired flexibility, improper body mechanics, postural dysfunction, and pain.   ACTIVITY LIMITATIONS continence, locomotion level, and intercourse  PARTICIPATION LIMITATIONS: interpersonal relationship and community activity  PERSONAL FACTORS Past/current experiences, Time since onset of injury/illness/exacerbation, and 1 comorbidity: medical history  are also affecting patient's functional outcome.   REHAB POTENTIAL: Good  CLINICAL DECISION MAKING: Stable/uncomplicated  EVALUATION COMPLEXITY: Low   GOALS: Goals reviewed with patient? Yes  SHORT TERM GOALS: Target date: 05/26/2022  Pt to be I with HEP.   Baseline: Goal status: MET  2.  Pt to be I with use of feminine moisturizers and lubricants as needed for improved vaginal health and decreased pain with penetration.  Baseline:  Goal status: MET  3.  Pt to demonstrated improved coordination with pelvic floor and breathing mechanics with body weight squats without leakage or pain for improved pelvic stability.  Baseline:  Goal status: MET   LONG TERM GOALS: Target date:  07/29/22    Pt to be I with advanced HEP.  Baseline:  Goal status: INITIAL  2.  Pt to demonstrate at least 5/5 bil hip strength for improved pelvic stability and functional squats without leakage.  Baseline:  Goal status: INITIAL  3. Pt to demonstrate at least 3/5 pelvic floor strength for improved pelvic stability and decreased strain at pelvic floor/ decrease leakage.  Baseline:  Goal status: INITIAL  4.  Pt to report improved time between bladder voids to at least 2.5 hours for improved QOL with decreased urinary frequency.   Baseline:  Goal status: INITIAL  5.  Pt to report no more than 2/10 pain with vaginal penetration for improved QOL and decreased pain with medical procedures. Baseline:  Goal status: INITIAL    PLAN: PT FREQUENCY: 1x/week  PT DURATION:  10 sessions  PLANNED INTERVENTIONS: Therapeutic exercises, Therapeutic activity, Neuromuscular re-education, Patient/Family education, Self Care, Joint mobilization, Aquatic Therapy, Dry Needling, Spinal mobilization, Cryotherapy, Moist heat, scar mobilization, Taping, Biofeedback, and Manual therapy  PLAN FOR NEXT SESSION: beginner core/hip strengthening with cues for pelvic floor contraction if able, internal if needed for improved coordination of pelvic floor   Stacy Gardner, PT, DPT 10/09/239:03 AM

## 2022-07-09 ENCOUNTER — Ambulatory Visit: Payer: BC Managed Care – PPO | Admitting: Physical Medicine and Rehabilitation

## 2022-07-13 ENCOUNTER — Ambulatory Visit: Payer: BC Managed Care – PPO | Admitting: Physical Therapy

## 2022-07-13 DIAGNOSIS — M6281 Muscle weakness (generalized): Secondary | ICD-10-CM | POA: Diagnosis not present

## 2022-07-13 DIAGNOSIS — R279 Unspecified lack of coordination: Secondary | ICD-10-CM

## 2022-07-13 NOTE — Therapy (Signed)
OUTPATIENT PHYSICAL THERAPY FEMALE PELVIC TREATMENT   Patient Name: Hannah Woodard MRN: [id number] DOB:[birth year], 51 y.o., female Today's Date: 07/13/2022   PT End of Session - 07/13/22 1621     Visit Number 10    Date for PT Re-Evaluation 07/29/22    Authorization Type BCBS    PT Start Time 1618    PT Stop Time 1657    PT Time Calculation (min) 39 min    Activity Tolerance Patient tolerated treatment well    Behavior During Therapy [facility] for tasks assessed/performed              Past Medical History:  Diagnosis Date   Breast cancer ([place])    Family history of ovarian cancer 09/02/2017   Family history of prostate cancer in father 09/02/2017   Family history of uterine cancer 09/02/2017   Plantar fasciitis    Past Surgical History:  Procedure Laterality Date   PORT-A-CATH REMOVAL Left 2020   right lumpectomy, sentinel node biopsy, and bilateral mammoplasty  10/2017   [facility]   Patient Active Problem List   Diagnosis Date Noted   Overweight (BMI 25.0-29.9) 12/31/2020   Hyperlipidemia 12/30/2020   Neuropathy due to chemotherapeutic drug ([place]) 12/31/2017   Port-A-Cath in place 11/12/2017   Genetic testing 09/15/2017   Malignant neoplasm of upper-inner quadrant of right breast in female, estrogen receptor positive ([place]) 08/27/2017   Vitamin D deficiency 12/09/2015   Medication management 12/09/2015   Dense breast tissue 12/09/2015    PCP: Hayden Rasmussen, MD  REFERRING PROVIDER: Dorothyann Gibbs, NP  REFERRING DIAG:  R35.0 (ICD-10-CM) - Urinary frequency N94.10 (ICD-10-CM) - Dyspareunia in female T27.6X5A (ICD-10-CM) - Adverse effect of antigonadotrophins, antiestrogens, antiandrogens, not elsewhere classified, initial encounter  THERAPY DIAG:  Muscle weakness (generalized)  Unspecified lack of coordination  Rationale for Evaluation and Treatment Rehabilitation  ONSET DATE: years  SUBJECTIVE:                                                                                                                                                                                            SUBJECTIVE STATEMENT: Pt reports her and husband had intercourse and did use lubricant with a lidocaine in it from [employer] and reports she did not have pain with intercourse. Pt states she is unsure if she can tighten any better as spouse unable to feel contraction during intercourse. Pt reports she thinks she has some leakage every day (one time per day) but unsure if this is really urine or sweat only noted with holding it for over several hours.   Fluid intake: Yes: 60 oz  water, tea sometimes but not regular       PAIN:  Are you having pain? No NPRS scale: 0/10 Pain location:      PRECAUTIONS: None  WEIGHT BEARING RESTRICTIONS No  FALLS:  Has patient fallen in last 6 months? 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  PLOF: Independent  PATIENT GOALS to have less pain, more regular voids  PERTINENT HISTORY:  personal history of triple positive breast cancer at age 51 and family history of ovarian cancer, BRCA negative on genetic testing Vaginal atrophy Malignant neoplasm of upper-inner quadrant of right breast in female, estrogen receptor positive   Sexual abuse: No  BOWEL MOVEMENT Pain with bowel movement: No Type of bowel movement:Type (Bristol Stool Scale) 2-3, Frequency daily, and Strain Yes but this has improved since starting using step stool in bathroom Fully empty rectum: No Leakage: No Pads: No Fiber supplement: Yes:    URINATION Pain with urination: No sometimes, rarely will have a burning Fully empty bladder: No Stream: Strong and Weak Urgency: Yes: consistently Frequency: 2 hours sometimes sooner, usually gets up at night 1x Leakage: Coughing and Sneezing Pads: No  INTERCOURSE Pain with intercourse: Initial Penetration,  During Penetration, After Intercourse, and Pain Interrupts Intercourse Ability to have vaginal penetration:  Yes:   Climax: not painful but pain prevents climax sometimes Marinoff Scale: 2/3  PREGNANCY Vaginal deliveries 0 Tearing No C-section deliveries 0 Currently pregnant No  PROLAPSE None    OBJECTIVE:   DIAGNOSTIC FINDINGS:   COGNITION:  Overall cognitive status: Within functional limits for tasks assessed     SENSATION:  Light touch: Appears intact   Proprioception: Appears intact  MUSCLE LENGTH: Bil hamstring and adductor limited by 25%                POSTURE: rounded shoulders and forward head    LUMBARAROM/PROM  A/PROM A/PROM  eval  Flexion Limited by 25%  Extension WFL  Right lateral flexion Limited by 25%  Left lateral flexion Limited by 25%  Right rotation WFL  Left rotation WFL   (Blank rows = not tested)  LOWER EXTREMITY ROM:  Bil WFL LOWER EXTREMITY MMT:   Bil hips 4/5 grossly, knees and ankles 5/5   PALPATION:   General  no TTP, mild fascial restrictions noted in lower abdominal quadrants and over bladder                External Perineal Exam no TTP, dryness noted externally                             Internal Pelvic Floor no TTP however increased tension noted throughout  Patient confirms identification and approves PT to assess internal pelvic floor and treatment Yes  PELVIC MMT:   MMT eval 06/03/22  07/06/22   Vaginal 1/5 with max cues and quick release; without cues 0/5; 1s; 1 rep 1/5 initially but with quick release 2/5.   2/5 with minimal cues  Internal Anal Sphincter     External Anal Sphincter     Puborectalis     Diastasis Recti     (Blank rows = not tested)        TONE: Mildly increased  PROLAPSE: Not seen in hooklying   TODAY'S TREATMENT   07/13/2022: Opp hand/knee press with green band above knee 2x10 Bridges 2x10 Quad shoulder flexion alt 2x10  Quad knee extension alt 2x10 Bladder diary review.    07/06/2022:  Pt consented to internal vaginal treatment:  Slightly improved with contraction strength at pelvic floor with much less cuing.  Focus of treatment was decreased tension at bil pelvic floor and improved mobility with less pain for intercourse. Pt had less tension throughout, did have two tension at bil obturator internus with trigger points noted here, all released with gentle over pressure. Pt reports slight TTP but improved since last session.   07/02/2022: Bridges 2x10 Hip flexion red loop in hooklying 2x10 Hip abduction red loop 2x10  Quad alt shoulder flexion with TA activations 10 each Seated opp hand/knee press x10    PATIENT EDUCATION:  Education details: [id number], urge drill  Person educated: Patient Education method: Explanation, Demonstration, Tactile cues, Verbal cues, and Handouts Education comprehension: verbalized understanding and returned demonstration   HOME EXERCISE PROGRAM: [id number]  ASSESSMENT:  CLINICAL IMPRESSION: Patient session focused on going over bladder diary which had pt's frequency much improved usually between 3-4 hours during the day and 0-1x per night. Pt does have 0-1 urinary leak per day with waiting over the 4 hours to urinate. Pt also reports no pain with intercourse but did use a lubricant with lidocaine in it and reports she still had all feeling but no pain. Pt session focused on hip and core strengthening with coordination of core activation and breathing mechanics. Pt would benefit from additional PT to further address deficits.     OBJECTIVE IMPAIRMENTS decreased coordination, decreased endurance, decreased mobility, decreased strength, increased fascial restrictions, increased muscle spasms, impaired flexibility, improper body mechanics, postural dysfunction, and pain.   ACTIVITY LIMITATIONS continence, locomotion level, and intercourse  PARTICIPATION LIMITATIONS: interpersonal relationship and community activity  PERSONAL  FACTORS Past/current experiences, Time since onset of injury/illness/exacerbation, and 1 comorbidity: medical history  are also affecting patient's functional outcome.   REHAB POTENTIAL: Good  CLINICAL DECISION MAKING: Stable/uncomplicated  EVALUATION COMPLEXITY: Low   GOALS: Goals reviewed with patient? Yes  SHORT TERM GOALS: Target date: 05/26/2022  Pt to be I with HEP.  Baseline: Goal status: MET  2.  Pt to be I with use of feminine moisturizers and lubricants as needed for improved vaginal health and decreased pain with penetration.  Baseline:  Goal status: MET  3.  Pt to demonstrated improved coordination with pelvic floor and breathing mechanics with body weight squats without leakage or pain for improved pelvic stability.  Baseline:  Goal status: MET   LONG TERM GOALS: Target date:  07/29/22    Pt to be I with advanced HEP.  Baseline:  Goal status: MET  2.  Pt to demonstrate at least 5/5 bil hip strength for improved pelvic stability and functional squats without leakage.  Baseline:  Goal status: INITIAL  3. Pt to demonstrate at least 3/5 pelvic floor strength for improved pelvic stability and decreased strain at pelvic floor/ decrease leakage.  Baseline:  Goal status: INITIAL  4.  Pt to report improved time between bladder voids to at least 2.5 hours for improved QOL with decreased urinary frequency.   Baseline:  Goal status: INITIAL  5.  Pt to report no more than 2/10 pain with vaginal penetration for improved QOL and decreased pain with medical procedures. Baseline:  Goal status: INITIAL    PLAN: PT FREQUENCY: 1x/week  PT DURATION:  10 sessions  PLANNED INTERVENTIONS: Therapeutic exercises, Therapeutic activity, Neuromuscular re-education, Patient/Family education, Self Care, Joint mobilization, Aquatic Therapy, Dry Needling, Spinal mobilization, Cryotherapy, Moist  heat, scar mobilization, Taping, Biofeedback, and Manual therapy  PLAN FOR NEXT  SESSION: beginner core/hip strengthening with cues for pelvic floor contraction if able, internal if needed for improved coordination of pelvic floor   Stacy Gardner, PT, DPT 10/16/234:58 PM

## 2022-07-20 ENCOUNTER — Ambulatory Visit: Payer: BC Managed Care – PPO | Admitting: Physical Therapy

## 2022-07-20 DIAGNOSIS — R279 Unspecified lack of coordination: Secondary | ICD-10-CM

## 2022-07-20 DIAGNOSIS — M6281 Muscle weakness (generalized): Secondary | ICD-10-CM

## 2022-07-20 NOTE — Therapy (Signed)
OUTPATIENT PHYSICAL THERAPY FEMALE PELVIC TREATMENT   Patient Name: Hannah Woodard MRN: [id number] DOB:02/05/1971, 51 y.o., female Today's Date: 07/20/2022   PT End of Session - 07/20/22 1620     Visit Number 11    Date for PT Re-Evaluation 07/29/22    Authorization Type BCBS    PT Start Time 1617    PT Stop Time 1700    PT Time Calculation (min) 43 min    Activity Tolerance Patient tolerated treatment well    Behavior During Therapy WFL for tasks assessed/performed              Past Medical History:  Diagnosis Date   Breast cancer ([facility])    Family history of ovarian cancer 09/02/2017   Family history of prostate cancer in father 09/02/2017   Family history of uterine cancer 09/02/2017   Plantar fasciitis    Past Surgical History:  Procedure Laterality Date   PORT-A-CATH REMOVAL Left 2020   right lumpectomy, sentinel node biopsy, and bilateral mammoplasty  10/2017   [facility]   Patient Active Problem List   Diagnosis Date Noted   Overweight (BMI 25.0-29.9) 12/31/2020   Hyperlipidemia 12/30/2020   Neuropathy due to chemotherapeutic drug ([facility]) 12/31/2017   Port-A-Cath in place 11/12/2017   Genetic testing 09/15/2017   Malignant neoplasm of upper-inner quadrant of right breast in female, estrogen receptor positive ([facility]) 08/27/2017   Vitamin D deficiency 12/09/2015   Medication management 12/09/2015   Dense breast tissue 12/09/2015    PCP: Richter, Karen L, MD  REFERRING PROVIDER: Cross, Melissa D, NP  REFERRING DIAG:  R35.0 (ICD-10-CM) - Urinary frequency N94.10 (ICD-10-CM) - Dyspareunia in female T38.6X5A (ICD-10-CM) - Adverse effect of antigonadotrophins, antiestrogens, antiandrogens, not elsewhere classified, initial encounter  THERAPY DIAG:  Muscle weakness (generalized)  Unspecified lack of coordination  Rationale for Evaluation and Treatment Rehabilitation  ONSET DATE: years  SUBJECTIVE:                                                                                                                                                                                            SUBJECTIVE STATEMENT: Pt reports pain overall has been better, dryness has been helped with moisturizer as well. Was not sexually active last week. Leakage has not been noted this week, "but I haven't really been paying attention to it". Pt also reports she hasn't been using vaginal dilators.   Fluid intake: Yes: 60 oz water, tea sometimes but not regular       PAIN:  Are you having pain? No NPRS scale: 0/10 Pain location:      PRECAUTIONS: None  WEIGHT BEARING   RESTRICTIONS No  FALLS:  Has patient fallen in last 6 months? 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  PLOF: Independent  PATIENT GOALS to have less pain, more regular voids  PERTINENT HISTORY:  personal history of triple positive breast cancer at age 79 and family history of ovarian cancer, BRCA negative on genetic testing Vaginal atrophy Malignant neoplasm of upper-inner quadrant of right breast in female, estrogen receptor positive   Sexual abuse: No  BOWEL MOVEMENT Pain with bowel movement: No Type of bowel movement:Type (Bristol Stool Scale) 2-3, Frequency daily, and Strain Yes but this has improved since starting using step stool in bathroom Fully empty rectum: No Leakage: No Pads: No Fiber supplement: Yes:    URINATION Pain with urination: No sometimes, rarely will have a burning Fully empty bladder: No Stream: Strong and Weak Urgency: Yes: consistently Frequency: 2 hours sometimes sooner, usually gets up at night 1x Leakage: Coughing and Sneezing Pads: No  INTERCOURSE Pain with intercourse: Initial Penetration, During Penetration, After Intercourse, and Pain Interrupts Intercourse Ability to have vaginal penetration:  Yes:   Climax: not painful but pain prevents climax  sometimes Marinoff Scale: 2/3  PREGNANCY Vaginal deliveries 0 Tearing No C-section deliveries 0 Currently pregnant No  PROLAPSE None    OBJECTIVE:   DIAGNOSTIC FINDINGS:   COGNITION:  Overall cognitive status: Within functional limits for tasks assessed     SENSATION:  Light touch: Appears intact   Proprioception: Appears intact  MUSCLE LENGTH: Bil hamstring and adductor limited by 25%                POSTURE: rounded shoulders and forward head    LUMBARAROM/PROM  A/PROM A/PROM  eval  Flexion Limited by 25%  Extension WFL  Right lateral flexion Limited by 25%  Left lateral flexion Limited by 25%  Right rotation WFL  Left rotation WFL   (Blank rows = not tested)  LOWER EXTREMITY ROM:  Bil WFL LOWER EXTREMITY MMT:   Bil hips 4/5 grossly, knees and ankles 5/5   PALPATION:   General  no TTP, mild fascial restrictions noted in lower abdominal quadrants and over bladder                External Perineal Exam no TTP, dryness noted externally                             Internal Pelvic Floor no TTP however increased tension noted throughout  Patient confirms identification and approves PT to assess internal pelvic floor and treatment Yes  PELVIC MMT:   MMT eval 06/03/22  07/06/22  07/20/22   Vaginal 1/5 with max cues and quick release; without cues 0/5; 1s; 1 rep 1/5 initially but with quick release 2/5.   2/5 with minimal cues 2/5 - however with palpation of superficial muscles pt reported pain with palpation at bulbocavernosus bil and had several small trigger points.   Internal Anal Sphincter      External Anal Sphincter      Puborectalis      Diastasis Recti      (Blank rows = not tested)        TONE: Mildly increased  PROLAPSE: Not seen in hooklying   TODAY'S TREATMENT   07/20/2022:  Pt consented to internal vaginal treatment this date and found to have continued tension at bil bulbocavernosus with TTP, continues  to have 2/5 strength with  moderate verbal cues however difficult to note if this is strength deficit or due to tightness. Pt reports she has not been using dilators at all in several weeks because she hasn't been as active sexually. Pt reeducated on vaginal dilator use, frequency, progression with no more than 4/10 pain. Pt denied additional questions and reports she will return to this and not focus on strengthening this week.   Manual at bulbocavernosus with trigger point release with several small trigger points noted here with tension, cues for diaphragmatic breathing and pelvic drops with minor improvement. Trigger points released well but tension still noted with less TTP.   07/13/2022: Opp hand/knee press with green band above knee 2x10 Bridges 2x10 Quad shoulder flexion alt 2x10  Quad knee extension alt 2x10 Bladder diary review.     PATIENT EDUCATION:  Education details: [id number], urge drill  Person educated: Patient Education method: Explanation, Demonstration, Tactile cues, Verbal cues, and Handouts Education comprehension: verbalized understanding and returned demonstration   HOME EXERCISE PROGRAM: [id number]  ASSESSMENT:  CLINICAL IMPRESSION: Patient session focused on internal vaginal treatment with pt having TTP and tension at bulbocavernosus, pt then reported she has not been using dilators as she hasn't been very sexually active. Pt reports she will return to this this week to see if this helps. Pt would benefit from additional PT to further address deficits.     OBJECTIVE IMPAIRMENTS decreased coordination, decreased endurance, decreased mobility, decreased strength, increased fascial restrictions, increased muscle spasms, impaired flexibility, improper body mechanics, postural dysfunction, and pain.   ACTIVITY LIMITATIONS continence, locomotion level, and intercourse  PARTICIPATION LIMITATIONS: interpersonal relationship and community activity  PERSONAL FACTORS Past/current experiences, Time  since onset of injury/illness/exacerbation, and 1 comorbidity: medical history  are also affecting patient's functional outcome.   REHAB POTENTIAL: Good  CLINICAL DECISION MAKING: Stable/uncomplicated  EVALUATION COMPLEXITY: Low   GOALS: Goals reviewed with patient? Yes  SHORT TERM GOALS: Target date: 05/26/2022  Pt to be I with HEP.  Baseline: Goal status: MET  2.  Pt to be I with use of feminine moisturizers and lubricants as needed for improved vaginal health and decreased pain with penetration.  Baseline:  Goal status: MET  3.  Pt to demonstrated improved coordination with pelvic floor and breathing mechanics with body weight squats without leakage or pain for improved pelvic stability.  Baseline:  Goal status: MET   LONG TERM GOALS: Target date:  07/29/22    Pt to be I with advanced HEP.  Baseline:  Goal status: MET  2.  Pt to demonstrate at least 5/5 bil hip strength for improved pelvic stability and functional squats without leakage.  Baseline:  Goal status: on going  3. Pt to demonstrate at least 3/5 pelvic floor strength for improved pelvic stability and decreased strain at pelvic floor/ decrease leakage.  Baseline:  Goal status: on going  4.  Pt to report improved time between bladder voids to at least 2.5 hours for improved QOL with decreased urinary frequency.   Baseline:  Goal status: met  5.  Pt to report no more than 2/10 pain with vaginal penetration for improved QOL and decreased pain with medical procedures. Baseline:  Goal status: on going    PLAN: PT FREQUENCY: 1x/week  PT DURATION:  10 sessions  PLANNED INTERVENTIONS: Therapeutic exercises, Therapeutic activity, Neuromuscular re-education, Patient/Family education, Self Care, Joint mobilization, Aquatic Therapy, Dry Needling, Spinal mobilization, Cryotherapy, Moist heat, scar mobilization, Taping, Biofeedback, and Manual therapy  PLAN FOR NEXT SESSION: beginner core/hip strengthening with  cues for pelvic floor contraction if able, internal if needed for improved coordination of pelvic floor   Haley Morelli, PT, DPT 10/23/235:09 PM  

## 2022-07-22 ENCOUNTER — Other Ambulatory Visit: Payer: Self-pay | Admitting: *Deleted

## 2022-07-22 MED ORDER — ANASTROZOLE 1 MG PO TABS: 1.0000 mg | ORAL_TABLET | Freq: Every day | ORAL | 4 refills | Status: DC

## 2022-07-27 ENCOUNTER — Ambulatory Visit: Payer: BC Managed Care – PPO | Admitting: Physical Therapy

## 2022-07-27 DIAGNOSIS — M6281 Muscle weakness (generalized): Secondary | ICD-10-CM | POA: Diagnosis not present

## 2022-07-27 DIAGNOSIS — M62838 Other muscle spasm: Secondary | ICD-10-CM

## 2022-07-27 DIAGNOSIS — R279 Unspecified lack of coordination: Secondary | ICD-10-CM

## 2022-07-27 NOTE — Therapy (Signed)
OUTPATIENT PHYSICAL THERAPY FEMALE PELVIC TREATMENT   Patient Name: Hannah Woodard MRN: [id number] DOB:28-Jun-1971, 51 y.o., female Today's Date: 07/27/2022   PT End of Session - 07/27/22 1608     Visit Number 12    Date for PT Re-Evaluation 07/29/22    Authorization Type BCBS    PT Start Time 1608    PT Stop Time 1648    PT Time Calculation (min) 40 min    Activity Tolerance Patient tolerated treatment well    Behavior During Therapy [facility] for tasks assessed/performed               Past Medical History:  Diagnosis Date   Breast cancer ([place])    Family history of ovarian cancer 09/02/2017   Family history of prostate cancer in father 09/02/2017   Family history of uterine cancer 09/02/2017   Plantar fasciitis    Past Surgical History:  Procedure Laterality Date   PORT-A-CATH REMOVAL Left 2020   right lumpectomy, sentinel node biopsy, and bilateral mammoplasty  10/2017   [facility]   Patient Active Problem List   Diagnosis Date Noted   Overweight (BMI 25.0-29.9) 12/31/2020   Hyperlipidemia 12/30/2020   Neuropathy due to chemotherapeutic drug ([place]) 12/31/2017   Port-A-Cath in place 11/12/2017   Genetic testing 09/15/2017   Malignant neoplasm of upper-inner quadrant of right breast in female, estrogen receptor positive ([place]) 08/27/2017   Vitamin D deficiency 12/09/2015   Medication management 12/09/2015   Dense breast tissue 12/09/2015    PCP: Hayden Rasmussen, MD  REFERRING PROVIDER: Dorothyann Gibbs, NP  REFERRING DIAG:  R35.0 (ICD-10-CM) - Urinary frequency N94.10 (ICD-10-CM) - Dyspareunia in female T79.6X5A (ICD-10-CM) - Adverse effect of antigonadotrophins, antiestrogens, antiandrogens, not elsewhere classified, initial encounter  THERAPY DIAG:  Muscle weakness (generalized)  Other muscle spasm  Unspecified lack of coordination  Rationale for Evaluation and Treatment Rehabilitation  ONSET DATE: years  SUBJECTIVE:                                                                                                                                                                                            SUBJECTIVE STATEMENT: Pt reports she forgot moisturizer one day and did note dryness after this but not bad. Pt reports she was able to try dilators this past week and had no pain with smallest dilator, but cannot find sizes 2 and 4 in set and from size 1 attempted size 3 and did have some pain 4/10 initially with insertion but less with mobility.   Fluid intake: Yes: 60 oz water, tea sometimes but not regular  PAIN:  Are you having pain? No NPRS scale: 0/10 Pain location:      PRECAUTIONS: None  WEIGHT BEARING RESTRICTIONS No  FALLS:  Has patient fallen in last 6 months? 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  PLOF: Independent  PATIENT GOALS to have less pain, more regular voids  PERTINENT HISTORY:  personal history of triple positive breast cancer at age 36 and family history of ovarian cancer, BRCA negative on genetic testing Vaginal atrophy Malignant neoplasm of upper-inner quadrant of right breast in female, estrogen receptor positive   Sexual abuse: No  BOWEL MOVEMENT Pain with bowel movement: No Type of bowel movement:Type (Bristol Stool Scale) 2-3, Frequency daily, and Strain Yes but this has improved since starting using step stool in bathroom Fully empty rectum: No Leakage: No Pads: No Fiber supplement: Yes:    URINATION Pain with urination: No sometimes, rarely will have a burning Fully empty bladder: No Stream: Strong and Weak Urgency: Yes: consistently Frequency: 2 hours sometimes sooner, usually gets up at night 1x Leakage: Coughing and Sneezing Pads: No  INTERCOURSE Pain with intercourse: Initial Penetration, During Penetration, After Intercourse, and Pain Interrupts Intercourse Ability to have  vaginal penetration:  Yes:   Climax: not painful but pain prevents climax sometimes Marinoff Scale: 2/3  PREGNANCY Vaginal deliveries 0 Tearing No C-section deliveries 0 Currently pregnant No  PROLAPSE None    OBJECTIVE:   DIAGNOSTIC FINDINGS:   COGNITION:  Overall cognitive status: Within functional limits for tasks assessed     SENSATION:  Light touch: Appears intact   Proprioception: Appears intact  MUSCLE LENGTH: Bil hamstring and adductor limited by 25%                POSTURE: rounded shoulders and forward head    LUMBARAROM/PROM  A/PROM A/PROM  eval  Flexion Limited by 25%  Extension WFL  Right lateral flexion Limited by 25%  Left lateral flexion Limited by 25%  Right rotation WFL  Left rotation WFL   (Blank rows = not tested)  LOWER EXTREMITY ROM:  Bil WFL LOWER EXTREMITY MMT:   Bil hips 4/5 grossly, knees and ankles 5/5   PALPATION:   General  no TTP, mild fascial restrictions noted in lower abdominal quadrants and over bladder                External Perineal Exam no TTP, dryness noted externally                             Internal Pelvic Floor no TTP however increased tension noted throughout  Patient confirms identification and approves PT to assess internal pelvic floor and treatment Yes  PELVIC MMT:   MMT eval 06/03/22  07/06/22  07/20/22   Vaginal 1/5 with max cues and quick release; without cues 0/5; 1s; 1 rep 1/5 initially but with quick release 2/5.   2/5 with minimal cues 2/5 - however with palpation of superficial muscles pt reported pain with palpation at bulbocavernosus bil and had several small trigger points.   Internal Anal Sphincter      External Anal Sphincter      Puborectalis      Diastasis Recti      (Blank rows = not tested)        TONE: Mildly increased  PROLAPSE: Not seen in hooklying   TODAY'S TREATMENT  07/27/22  Pt educated on continued POC, goals remaining and HEP including continued use of vaginal  dilators.  Positions for intercourse handout for decreased pain given to pt and reviewed to individualize for pt to best understand handout based on her specific pain.   07/20/2022:  Pt consented to internal vaginal treatment this date and found to have continued tension at bil bulbocavernosus with TTP, continues to have 2/5 strength with moderate verbal cues however difficult to note if this is strength deficit or due to tightness. Pt reports she has not been using dilators at all in several weeks because she hasn't been as active sexually. Pt reeducated on vaginal dilator use, frequency, progression with no more than 4/10 pain. Pt denied additional questions and reports she will return to this and not focus on strengthening this week.   Manual at bulbocavernosus with trigger point release with several small trigger points noted here with tension, cues for diaphragmatic breathing and pelvic drops with minor improvement. Trigger points released well but tension still noted with less TTP.    PATIENT EDUCATION:  Education details: [id number], urge drill  Person educated: Patient Education method: Explanation, Demonstration, Tactile cues, Verbal cues, and Handouts Education comprehension: verbalized understanding and returned demonstration   HOME EXERCISE PROGRAM: [id number]  ASSESSMENT:  CLINICAL IMPRESSION: Patient session focused on education for recert today to review all goals, POC, HEP. Then education on positions for decreased pain during intercourse to assist in further progress toward goals. Pt denied questions at end of session. Pt would benefit from additional PT to further address deficits to further address pain with intercourse which pt has progressed toward goal but not at 2/10 pain yet, pt also limited with strength training of pelvic floor as return to focus of session being pain with penetration now that pt has been able to return to intercourse.     OBJECTIVE IMPAIRMENTS decreased  coordination, decreased endurance, decreased mobility, decreased strength, increased fascial restrictions, increased muscle spasms, impaired flexibility, improper body mechanics, postural dysfunction, and pain.   ACTIVITY LIMITATIONS continence, locomotion level, and intercourse  PARTICIPATION LIMITATIONS: interpersonal relationship and community activity  PERSONAL FACTORS Past/current experiences, Time since onset of injury/illness/exacerbation, and 1 comorbidity: medical history  are also affecting patient's functional outcome.   REHAB POTENTIAL: Good  CLINICAL DECISION MAKING: Stable/uncomplicated  EVALUATION COMPLEXITY: Low   GOALS: Goals reviewed with patient? Yes  SHORT TERM GOALS: Target date: 05/26/2022  Pt to be I with HEP.  Baseline: Goal status: MET  2.  Pt to be I with use of feminine moisturizers and lubricants as needed for improved vaginal health and decreased pain with penetration.  Baseline:  Goal status: MET  3.  Pt to demonstrated improved coordination with pelvic floor and breathing mechanics with body weight squats without leakage or pain for improved pelvic stability.  Baseline:  Goal status: MET   LONG TERM GOALS: Target date:  07/29/22    Pt to be I with advanced HEP.  Baseline:  Goal status: MET  2.  Pt to demonstrate at least 5/5 bil hip strength for improved pelvic stability and functional squats without leakage.  Baseline:  Goal status: MET  3. Pt to demonstrate at least 3/5 pelvic floor strength for improved pelvic stability and decreased strain at pelvic floor/ decrease leakage.  Baseline:  Goal status: on going - focus on recent sessions has been decreasing internal pelvic pain with intercourse however has shown progress toward this goal but last finding 2/5.   4.  Pt to report improved time between bladder voids to at least 2.5 hours for improved QOL with decreased urinary frequency.   Baseline:  Goal status: met  5.  Pt to report no  more than 2/10 pain with vaginal penetration for improved QOL and decreased pain with medical procedures. Baseline:  Goal status: on going - has been improving but not consistently 2 or less/10. At worse 5/10 without extra time for penetration but with extra time and lubrication 3-4/10.     PLAN: PT FREQUENCY: 1x/week  PT DURATION:  6 sessions 07/27/22 additional from recert  PLANNED INTERVENTIONS: Therapeutic exercises, Therapeutic activity, Neuromuscular re-education, Patient/Family education, Self Care, Joint mobilization, Aquatic Therapy, Dry Needling, Spinal mobilization, Cryotherapy, Moist heat, scar mobilization, Taping, Biofeedback, and Manual therapy  PLAN FOR NEXT SESSION: core/hip strengthening with cues for pelvic floor contraction if able, internal manual release as needed.   Stacy Gardner, PT, DPT 10/30/234:53 PM

## 2022-07-28 ENCOUNTER — Encounter: Payer: BC Managed Care – PPO | Admitting: Physical Therapy

## 2022-07-28 ENCOUNTER — Ambulatory Visit: Payer: BC Managed Care – PPO

## 2022-08-04 ENCOUNTER — Ambulatory Visit: Payer: BC Managed Care – PPO

## 2022-08-05 ENCOUNTER — Inpatient Hospital Stay: Payer: BC Managed Care – PPO | Attending: Hematology and Oncology

## 2022-08-05 ENCOUNTER — Other Ambulatory Visit: Payer: Self-pay

## 2022-08-05 VITALS — BP 143/89 | HR 75 | Temp 98.5°F | Resp 18

## 2022-08-05 DIAGNOSIS — Z95828 Presence of other vascular implants and grafts: Secondary | ICD-10-CM

## 2022-08-05 DIAGNOSIS — Z79811 Long term (current) use of aromatase inhibitors: Secondary | ICD-10-CM | POA: Diagnosis not present

## 2022-08-05 DIAGNOSIS — Z170 Estrogen receptor positive status [ER+]: Secondary | ICD-10-CM | POA: Diagnosis not present

## 2022-08-05 DIAGNOSIS — C50211 Malignant neoplasm of upper-inner quadrant of right female breast: Secondary | ICD-10-CM | POA: Diagnosis present

## 2022-08-05 DIAGNOSIS — Z5111 Encounter for antineoplastic chemotherapy: Secondary | ICD-10-CM | POA: Diagnosis not present

## 2022-08-05 MED ORDER — GOSERELIN ACETATE 10.8 MG ~~LOC~~ IMPL
10.8000 mg | DRUG_IMPLANT | Freq: Once | SUBCUTANEOUS | Status: AC
  Administered 2022-08-05: 10.8 mg via SUBCUTANEOUS
  Filled 2022-08-05: qty 10.8

## 2022-08-06 ENCOUNTER — Ambulatory Visit: Payer: BC Managed Care – PPO | Attending: Gynecologic Oncology | Admitting: Physical Therapy

## 2022-08-06 DIAGNOSIS — R279 Unspecified lack of coordination: Secondary | ICD-10-CM | POA: Insufficient documentation

## 2022-08-06 DIAGNOSIS — M62838 Other muscle spasm: Secondary | ICD-10-CM | POA: Insufficient documentation

## 2022-08-06 DIAGNOSIS — R293 Abnormal posture: Secondary | ICD-10-CM | POA: Insufficient documentation

## 2022-08-06 DIAGNOSIS — M6281 Muscle weakness (generalized): Secondary | ICD-10-CM | POA: Diagnosis not present

## 2022-08-06 NOTE — Therapy (Signed)
OUTPATIENT PHYSICAL THERAPY FEMALE PELVIC TREATMENT   Patient Name: Hannah Woodard MRN: [id number] DOB:1970/12/04, 51 y.o., female Today's Date: 08/06/2022   PT End of Session - 08/06/22 1615     Visit Number 13    Date for PT Re-Evaluation 10/27/22    Authorization Type BCBS    PT Start Time 1615    PT Stop Time 1653    PT Time Calculation (min) 38 min    Activity Tolerance Patient tolerated treatment well    Behavior During Therapy [facility] for tasks assessed/performed               Past Medical History:  Diagnosis Date   Breast cancer ([place])    Family history of ovarian cancer 09/02/2017   Family history of prostate cancer in father 09/02/2017   Family history of uterine cancer 09/02/2017   Plantar fasciitis    Past Surgical History:  Procedure Laterality Date   PORT-A-CATH REMOVAL Left 2020   right lumpectomy, sentinel node biopsy, and bilateral mammoplasty  10/2017   [facility]   Patient Active Problem List   Diagnosis Date Noted   Overweight (BMI 25.0-29.9) 12/31/2020   Hyperlipidemia 12/30/2020   Neuropathy due to chemotherapeutic drug ([place]) 12/31/2017   Port-A-Cath in place 11/12/2017   Genetic testing 09/15/2017   Malignant neoplasm of upper-inner quadrant of right breast in female, estrogen receptor positive ([place]) 08/27/2017   Vitamin D deficiency 12/09/2015   Medication management 12/09/2015   Dense breast tissue 12/09/2015    PCP: Hayden Rasmussen, MD  REFERRING PROVIDER: Dorothyann Gibbs, NP  REFERRING DIAG:  R35.0 (ICD-10-CM) - Urinary frequency N94.10 (ICD-10-CM) - Dyspareunia in female T18.6X5A (ICD-10-CM) - Adverse effect of antigonadotrophins, antiestrogens, antiandrogens, not elsewhere classified, initial encounter  THERAPY DIAG:  Muscle weakness (generalized)  Abnormal posture  Unspecified lack of coordination  Rationale for Evaluation and Treatment Rehabilitation  ONSET DATE: years  SUBJECTIVE:                                                                                                                                                                                            SUBJECTIVE STATEMENT: Pt reports she has been unable to find size 3 of her dilator but has 1,2,4 and doesn't have pain with 1 and very minimal with size 2 but unable to penetrate vagina with size 4. Pt has not had intercourse since last session so unsure about improvement here yet.   Fluid intake: Yes: 60 oz water, tea sometimes but not regular       PAIN:  Are you having pain? Yes NPRS scale: 4/10 Pain location:  Lt  hip    PRECAUTIONS: None  WEIGHT BEARING RESTRICTIONS No  FALLS:  Has patient fallen in last 6 months? 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  PLOF: Independent  PATIENT GOALS to have less pain, more regular voids  PERTINENT HISTORY:  personal history of triple positive breast cancer at age 67 and family history of ovarian cancer, BRCA negative on genetic testing Vaginal atrophy Malignant neoplasm of upper-inner quadrant of right breast in female, estrogen receptor positive   Sexual abuse: No  BOWEL MOVEMENT Pain with bowel movement: No Type of bowel movement:Type (Bristol Stool Scale) 2-3, Frequency daily, and Strain Yes but this has improved since starting using step stool in bathroom Fully empty rectum: No Leakage: No Pads: No Fiber supplement: Yes:    URINATION Pain with urination: No sometimes, rarely will have a burning Fully empty bladder: No Stream: Strong and Weak Urgency: Yes: consistently Frequency: 2 hours sometimes sooner, usually gets up at night 1x Leakage: Coughing and Sneezing Pads: No  INTERCOURSE Pain with intercourse: Initial Penetration, During Penetration, After Intercourse, and Pain Interrupts Intercourse Ability to have vaginal penetration:  Yes:   Climax: not painful but pain  prevents climax sometimes Marinoff Scale: 2/3  PREGNANCY Vaginal deliveries 0 Tearing No C-section deliveries 0 Currently pregnant No  PROLAPSE None    OBJECTIVE:   DIAGNOSTIC FINDINGS:   COGNITION:  Overall cognitive status: Within functional limits for tasks assessed     SENSATION:  Light touch: Appears intact   Proprioception: Appears intact  MUSCLE LENGTH: Bil hamstring and adductor limited by 25%                POSTURE: rounded shoulders and forward head    LUMBARAROM/PROM  A/PROM A/PROM  eval  Flexion Limited by 25%  Extension WFL  Right lateral flexion Limited by 25%  Left lateral flexion Limited by 25%  Right rotation WFL  Left rotation WFL   (Blank rows = not tested)  LOWER EXTREMITY ROM:  Bil WFL LOWER EXTREMITY MMT:   Bil hips 4/5 grossly, knees and ankles 5/5   PALPATION:   General  no TTP, mild fascial restrictions noted in lower abdominal quadrants and over bladder                External Perineal Exam no TTP, dryness noted externally                             Internal Pelvic Floor no TTP however increased tension noted throughout  Patient confirms identification and approves PT to assess internal pelvic floor and treatment Yes  PELVIC MMT:   MMT eval 06/03/22  07/06/22  07/20/22   Vaginal 1/5 with max cues and quick release; without cues 0/5; 1s; 1 rep 1/5 initially but with quick release 2/5.   2/5 with minimal cues 2/5 - however with palpation of superficial muscles pt reported pain with palpation at bulbocavernosus bil and had several small trigger points.   Internal Anal Sphincter      External Anal Sphincter      Puborectalis      Diastasis Recti      (Blank rows = not tested)        TONE: Mildly increased  PROLAPSE: Not seen in hooklying   TODAY'S TREATMENT   08/06/22  Pt consented to manual internal vaginal treatment: pt had improved mobility  throughout pelvic floor but did still have tightness noted at Rt side of  pelvic floor more so than Lt with TTP however no trigger points today. Pt tolerated well and reported decreased TTP progressively with gentle stretching in all directions. Pt demonstrated improved mobility at end of session and greatly improved since start of PT however pt is still limited with pain with vaginal penetration and reports she is going to attempt to buy a replacement third size as this would be more related to spouse and help pt more than attempting 4th size.   PATIENT EDUCATION:  Education details: [id number], urge drill  Person educated: Patient Education method: Explanation, Demonstration, Tactile cues, Verbal cues, and Handouts Education comprehension: verbalized understanding and returned demonstration   HOME EXERCISE PROGRAM: [id number]  ASSESSMENT:  CLINICAL IMPRESSION: Patient session focused on education for recert today to review all goals, POC, HEP. Then education on positions for decreased pain during intercourse to assist in further progress toward goals. Pt denied questions at end of session. Pt would benefit from additional PT to further address deficits to further address pain with intercourse which pt has progressed toward goal but not at 2/10 pain yet, pt also limited with strength training of pelvic floor as return to focus of session being pain with penetration now that pt has been able to return to intercourse.     OBJECTIVE IMPAIRMENTS decreased coordination, decreased endurance, decreased mobility, decreased strength, increased fascial restrictions, increased muscle spasms, impaired flexibility, improper body mechanics, postural dysfunction, and pain.   ACTIVITY LIMITATIONS continence, locomotion level, and intercourse  PARTICIPATION LIMITATIONS: interpersonal relationship and community activity  PERSONAL FACTORS Past/current experiences, Time since onset of injury/illness/exacerbation, and 1 comorbidity: medical history  are also affecting patient's functional  outcome.   REHAB POTENTIAL: Good  CLINICAL DECISION MAKING: Stable/uncomplicated  EVALUATION COMPLEXITY: Low   GOALS: Goals reviewed with patient? Yes  SHORT TERM GOALS: Target date: 05/26/2022  Pt to be I with HEP.  Baseline: Goal status: MET  2.  Pt to be I with use of feminine moisturizers and lubricants as needed for improved vaginal health and decreased pain with penetration.  Baseline:  Goal status: MET  3.  Pt to demonstrated improved coordination with pelvic floor and breathing mechanics with body weight squats without leakage or pain for improved pelvic stability.  Baseline:  Goal status: MET   LONG TERM GOALS: Target date:  07/29/22  >10/27/21 extension   Pt to be I with advanced HEP.  Baseline:  Goal status: MET  2.  Pt to demonstrate at least 5/5 bil hip strength for improved pelvic stability and functional squats without leakage.  Baseline:  Goal status: MET  3. Pt to demonstrate at least 3/5 pelvic floor strength for improved pelvic stability and decreased strain at pelvic floor/ decrease leakage.  Baseline:  Goal status: on going - focus on recent sessions has been decreasing internal pelvic pain with intercourse however has shown progress toward this goal but last finding 2/5.   4.  Pt to report improved time between bladder voids to at least 2.5 hours for improved QOL with decreased urinary frequency.   Baseline:  Goal status: met  5.  Pt to report no more than 2/10 pain with vaginal penetration for improved QOL and decreased pain with medical procedures. Baseline:  Goal status: on going - has been improving but not consistently 2 or less/10. At worse 5/10 without extra time for penetration but with extra time and lubrication 3-4/10.  PLAN: PT FREQUENCY: 1x/week  PT DURATION:  6 sessions 07/27/22 additional from recert  PLANNED INTERVENTIONS: Therapeutic exercises, Therapeutic activity, Neuromuscular re-education, Patient/Family education, Self  Care, Joint mobilization, Aquatic Therapy, Dry Needling, Spinal mobilization, Cryotherapy, Moist heat, scar mobilization, Taping, Biofeedback, and Manual therapy  PLAN FOR NEXT SESSION: core/hip strengthening with cues for pelvic floor contraction if able, internal manual release as needed.   Stacy Gardner, PT, DPT 11/09/235:00 PM

## 2022-08-11 ENCOUNTER — Other Ambulatory Visit: Payer: Self-pay | Admitting: *Deleted

## 2022-08-13 ENCOUNTER — Ambulatory Visit: Payer: BC Managed Care – PPO | Admitting: Physical Therapy

## 2022-08-13 DIAGNOSIS — R279 Unspecified lack of coordination: Secondary | ICD-10-CM

## 2022-08-13 DIAGNOSIS — R293 Abnormal posture: Secondary | ICD-10-CM

## 2022-08-13 DIAGNOSIS — M6281 Muscle weakness (generalized): Secondary | ICD-10-CM | POA: Diagnosis not present

## 2022-08-13 DIAGNOSIS — M62838 Other muscle spasm: Secondary | ICD-10-CM

## 2022-08-13 NOTE — Therapy (Signed)
OUTPATIENT PHYSICAL THERAPY FEMALE PELVIC TREATMENT   Patient Name: Hannah Woodard MRN: [id number] DOB:06-Oct-1970, 51 y.o., female Today's Date: 08/13/2022   PT End of Session - 08/13/22 1536     Visit Number 14    Date for PT Re-Evaluation 10/27/22    Authorization Type BCBS    PT Start Time 1533    PT Stop Time 1618    PT Time Calculation (min) 45 min    Activity Tolerance Patient tolerated treatment well    Behavior During Therapy [facility] for tasks assessed/performed               Past Medical History:  Diagnosis Date   Breast cancer ([place])    Family history of ovarian cancer 09/02/2017   Family history of prostate cancer in father 09/02/2017   Family history of uterine cancer 09/02/2017   Plantar fasciitis    Past Surgical History:  Procedure Laterality Date   PORT-A-CATH REMOVAL Left 2020   right lumpectomy, sentinel node biopsy, and bilateral mammoplasty  10/2017   [facility]   Patient Active Problem List   Diagnosis Date Noted   Overweight (BMI 25.0-29.9) 12/31/2020   Hyperlipidemia 12/30/2020   Neuropathy due to chemotherapeutic drug ([place]) 12/31/2017   Port-A-Cath in place 11/12/2017   Genetic testing 09/15/2017   Malignant neoplasm of upper-inner quadrant of right breast in female, estrogen receptor positive ([place]) 08/27/2017   Vitamin D deficiency 12/09/2015   Medication management 12/09/2015   Dense breast tissue 12/09/2015    PCP: Hayden Rasmussen, MD  REFERRING PROVIDER: Dorothyann Gibbs, NP  REFERRING DIAG:  R35.0 (ICD-10-CM) - Urinary frequency N94.10 (ICD-10-CM) - Dyspareunia in female T65.6X5A (ICD-10-CM) - Adverse effect of antigonadotrophins, antiestrogens, antiandrogens, not elsewhere classified, initial encounter  THERAPY DIAG:  Muscle weakness (generalized)  Abnormal posture  Unspecified lack of coordination  Other muscle spasm  Rationale for Evaluation and Treatment Rehabilitation  ONSET DATE: years  SUBJECTIVE:                                                                                                                                                                                            SUBJECTIVE STATEMENT: Pt reports she has had intercourse and it was still painful 6/10 with penetration, did use dilators prior but didn't seem to be able to relax per pt. Pt reports she was on top this and is usually their normal but because of this is harder for her to relax in general. Has been using dilators 2 times per week 1/10 pain with size 2 dilator used  Fluid intake: Yes: 60 oz water, tea sometimes but not regular  PAIN:  Are you having pain? No - but with intercourse  NPRS scale: 5/10 Pain location:  intercourse, internally vaginally.   PRECAUTIONS: None  WEIGHT BEARING RESTRICTIONS No  FALLS:  Has patient fallen in last 6 months? 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  PLOF: Independent  PATIENT GOALS to have less pain, more regular voids  PERTINENT HISTORY:  personal history of triple positive breast cancer at age 60 and family history of ovarian cancer, BRCA negative on genetic testing Vaginal atrophy Malignant neoplasm of upper-inner quadrant of right breast in female, estrogen receptor positive   Sexual abuse: No  BOWEL MOVEMENT Pain with bowel movement: No Type of bowel movement:Type (Bristol Stool Scale) 2-3, Frequency daily, and Strain Yes but this has improved since starting using step stool in bathroom Fully empty rectum: No Leakage: No Pads: No Fiber supplement: Yes:    URINATION Pain with urination: No sometimes, rarely will have a burning Fully empty bladder: No Stream: Strong and Weak Urgency: Yes: consistently Frequency: 2 hours sometimes sooner, usually gets up at night 1x Leakage: Coughing and Sneezing Pads: No  INTERCOURSE Pain with intercourse: Initial Penetration,  During Penetration, After Intercourse, and Pain Interrupts Intercourse Ability to have vaginal penetration:  Yes:   Climax: not painful but pain prevents climax sometimes Marinoff Scale: 2/3  PREGNANCY Vaginal deliveries 0 Tearing No C-section deliveries 0 Currently pregnant No  PROLAPSE None    OBJECTIVE:   DIAGNOSTIC FINDINGS:   COGNITION:  Overall cognitive status: Within functional limits for tasks assessed     SENSATION:  Light touch: Appears intact   Proprioception: Appears intact  MUSCLE LENGTH: Bil hamstring and adductor limited by 25%                POSTURE: rounded shoulders and forward head    LUMBARAROM/PROM  A/PROM A/PROM  eval  Flexion Limited by 25%  Extension WFL  Right lateral flexion Limited by 25%  Left lateral flexion Limited by 25%  Right rotation WFL  Left rotation WFL   (Blank rows = not tested)  LOWER EXTREMITY ROM:  Bil WFL LOWER EXTREMITY MMT:   Bil hips 4/5 grossly, knees and ankles 5/5   PALPATION:   General  no TTP, mild fascial restrictions noted in lower abdominal quadrants and over bladder                External Perineal Exam no TTP, dryness noted externally                             Internal Pelvic Floor no TTP however increased tension noted throughout  Patient confirms identification and approves PT to assess internal pelvic floor and treatment Yes  PELVIC MMT:   MMT eval 06/03/22  07/06/22  07/20/22   Vaginal 1/5 with max cues and quick release; without cues 0/5; 1s; 1 rep 1/5 initially but with quick release 2/5.   2/5 with minimal cues 2/5 - however with palpation of superficial muscles pt reported pain with palpation at bulbocavernosus bil and had several small trigger points.   Internal Anal Sphincter      External Anal Sphincter      Puborectalis      Diastasis Recti      (Blank rows = not tested)        TONE: Mildly increased  PROLAPSE: Not seen in hooklying  TODAY'S TREATMENT   08/06/22  Pt  educated on different options for dilators, oh nut product and breathing/relaxing options for pelvis. Pt also educated on dry needling.  Manual work: at bil adductors and Rt hip flexors with much more tension at Rt side compared to LT and pt had more TTP at right side as well. Manual work provided within pt tolerance and did demonstrate improved mobility slightly but noted tension remained and continued into proximal adductors and pelvis. Manual also at external pelvic floor at UG triangle release with noted TTP at proximal adductors and bulbocavernosus and ischiocavernosus.  Pt also educated on dry needling and handout given.   PATIENT EDUCATION:  Education details: [id number], urge drill  Person educated: Patient Education method: Explanation, Demonstration, Tactile cues, Verbal cues, and Handouts Education comprehension: verbalized understanding and returned demonstration   HOME EXERCISE PROGRAM: [id number]  ASSESSMENT:  CLINICAL IMPRESSION: Patient session focused on education for pelvic dilators vs oh nut product, continued relaxation techniques and dry needling. Rest of session focused on manual work at adductors, hip flexors, and external pelvic floor. Pt has noted tension in these areas and TTP with palpation here. Pt may benefit from dry needling to improve hip mobility and low back mobility to reduce tension at pelvic floor. Pt would benefit from additional PT to further address deficits to further address pain with intercourse and noted tension at pelvic floor and hips.    OBJECTIVE IMPAIRMENTS decreased coordination, decreased endurance, decreased mobility, decreased strength, increased fascial restrictions, increased muscle spasms, impaired flexibility, improper body mechanics, postural dysfunction, and pain.   ACTIVITY LIMITATIONS continence, locomotion level, and intercourse  PARTICIPATION LIMITATIONS: interpersonal relationship and community activity  PERSONAL FACTORS  Past/current experiences, Time since onset of injury/illness/exacerbation, and 1 comorbidity: medical history  are also affecting patient's functional outcome.   REHAB POTENTIAL: Good  CLINICAL DECISION MAKING: Stable/uncomplicated  EVALUATION COMPLEXITY: Low   GOALS: Goals reviewed with patient? Yes  SHORT TERM GOALS: Target date: 05/26/2022  Pt to be I with HEP.  Baseline: Goal status: MET  2.  Pt to be I with use of feminine moisturizers and lubricants as needed for improved vaginal health and decreased pain with penetration.  Baseline:  Goal status: MET  3.  Pt to demonstrated improved coordination with pelvic floor and breathing mechanics with body weight squats without leakage or pain for improved pelvic stability.  Baseline:  Goal status: MET   LONG TERM GOALS: Target date:  07/29/22  >10/27/21 extension   Pt to be I with advanced HEP.  Baseline:  Goal status: MET  2.  Pt to demonstrate at least 5/5 bil hip strength for improved pelvic stability and functional squats without leakage.  Baseline:  Goal status: MET  3. Pt to demonstrate at least 3/5 pelvic floor strength for improved pelvic stability and decreased strain at pelvic floor/ decrease leakage.  Baseline:  Goal status: on going - focus on recent sessions has been decreasing internal pelvic pain with intercourse however has shown progress toward this goal but last finding 2/5.   4.  Pt to report improved time between bladder voids to at least 2.5 hours for improved QOL with decreased urinary frequency.   Baseline:  Goal status: met  5.  Pt to report no more than 2/10 pain with vaginal penetration for improved QOL and decreased pain with medical procedures. Baseline:  Goal status: on going - has been improving but not consistently 2 or less/10. At worse 5/10 without extra time for penetration  but with extra time and lubrication 3-4/10.     PLAN: PT FREQUENCY: 1x/week  PT DURATION:  6 sessions 07/27/22  additional from recert  PLANNED INTERVENTIONS: Therapeutic exercises, Therapeutic activity, Neuromuscular re-education, Patient/Family education, Self Care, Joint mobilization, Aquatic Therapy, Dry Needling, Spinal mobilization, Cryotherapy, Moist heat, scar mobilization, Taping, Biofeedback, and Manual therapy  PLAN FOR NEXT SESSION: core/hip strengthening with cues for pelvic floor contraction if able, internal manual release as needed.   Stacy Gardner, PT, DPT 11/16/234:42 PM

## 2022-08-13 NOTE — Patient Instructions (Signed)

## 2022-08-17 ENCOUNTER — Ambulatory Visit: Payer: BC Managed Care – PPO | Admitting: Physical Therapy

## 2022-08-17 ENCOUNTER — Encounter: Payer: Self-pay | Admitting: Rehabilitative and Restorative Service Providers"

## 2022-08-17 ENCOUNTER — Ambulatory Visit: Payer: BC Managed Care – PPO | Admitting: Rehabilitative and Restorative Service Providers"

## 2022-08-17 DIAGNOSIS — R279 Unspecified lack of coordination: Secondary | ICD-10-CM

## 2022-08-17 DIAGNOSIS — R293 Abnormal posture: Secondary | ICD-10-CM

## 2022-08-17 DIAGNOSIS — M6281 Muscle weakness (generalized): Secondary | ICD-10-CM | POA: Diagnosis not present

## 2022-08-17 NOTE — Therapy (Signed)
OUTPATIENT PHYSICAL THERAPY FEMALE PELVIC TREATMENT   Patient Name: Hannah Woodard MRN: [id number] DOB:02/21/1971, 51 y.o., female Today's Date: 08/17/2022   PT End of Session - 08/17/22 0735     Visit Number 15    Date for PT Re-Evaluation 10/27/22    Authorization Type BCBS    PT Start Time 0731    PT Stop Time 0800    PT Time Calculation (min) 29 min    Activity Tolerance Patient tolerated treatment well    Behavior During Therapy [facility] for tasks assessed/performed               Past Medical History:  Diagnosis Date   Breast cancer ([place])    Family history of ovarian cancer 09/02/2017   Family history of prostate cancer in father 09/02/2017   Family history of uterine cancer 09/02/2017   Plantar fasciitis    Past Surgical History:  Procedure Laterality Date   PORT-A-CATH REMOVAL Left 2020   right lumpectomy, sentinel node biopsy, and bilateral mammoplasty  10/2017   [facility]   Patient Active Problem List   Diagnosis Date Noted   Overweight (BMI 25.0-29.9) 12/31/2020   Hyperlipidemia 12/30/2020   Neuropathy due to chemotherapeutic drug ([place]) 12/31/2017   Port-A-Cath in place 11/12/2017   Genetic testing 09/15/2017   Malignant neoplasm of upper-inner quadrant of right breast in female, estrogen receptor positive ([place]) 08/27/2017   Vitamin D deficiency 12/09/2015   Medication management 12/09/2015   Dense breast tissue 12/09/2015    PCP: Hayden Rasmussen, MD  REFERRING PROVIDER: Dorothyann Gibbs, NP  REFERRING DIAG:  R35.0 (ICD-10-CM) - Urinary frequency N94.10 (ICD-10-CM) - Dyspareunia in female T68.6X5A (ICD-10-CM) - Adverse effect of antigonadotrophins, antiestrogens, antiandrogens, not elsewhere classified, initial encounter  THERAPY DIAG:  Muscle weakness (generalized)  Abnormal posture  Unspecified lack of coordination  Rationale for Evaluation and Treatment Rehabilitation  ONSET DATE: years  SUBJECTIVE:                                                                                                                                                                                            SUBJECTIVE STATEMENT: Pt reports that her feet are hurting her a lot this morning.  She states that her dog jumped on her thighs this morning and that hurt due to her sensitivity.  Fluid intake: Yes: 60 oz water, tea sometimes but not regular       PAIN:  Are you having pain? No - but with intercourse  NPRS scale: 3-5/10 Pain location:  intercourse, internally vaginally.  Pain reported in feet and thighs   PRECAUTIONS: None  WEIGHT  BEARING RESTRICTIONS No  FALLS:  Has patient fallen in last 6 months? 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  PLOF: Independent  PATIENT GOALS to have less pain, more regular voids  PERTINENT HISTORY:  personal history of triple positive breast cancer at age 77 and family history of ovarian cancer, BRCA negative on genetic testing Vaginal atrophy Malignant neoplasm of upper-inner quadrant of right breast in female, estrogen receptor positive   Sexual abuse: No  BOWEL MOVEMENT Pain with bowel movement: No Type of bowel movement:Type (Bristol Stool Scale) 2-3, Frequency daily, and Strain Yes but this has improved since starting using step stool in bathroom Fully empty rectum: No Leakage: No Pads: No Fiber supplement: Yes:    URINATION Pain with urination: No sometimes, rarely will have a burning Fully empty bladder: No Stream: Strong and Weak Urgency: Yes: consistently Frequency: 2 hours sometimes sooner, usually gets up at night 1x Leakage: Coughing and Sneezing Pads: No  INTERCOURSE Pain with intercourse: Initial Penetration, During Penetration, After Intercourse, and Pain Interrupts Intercourse Ability to have vaginal penetration:  Yes:   Climax: not painful but pain prevents climax  sometimes Marinoff Scale: 2/3  PREGNANCY Vaginal deliveries 0 Tearing No C-section deliveries 0 Currently pregnant No  PROLAPSE None    OBJECTIVE:   DIAGNOSTIC FINDINGS:   COGNITION:  Overall cognitive status: Within functional limits for tasks assessed     SENSATION:  Light touch: Appears intact   Proprioception: Appears intact  MUSCLE LENGTH: Bil hamstring and adductor limited by 25%                POSTURE: rounded shoulders and forward head    LUMBARAROM/PROM  A/PROM A/PROM  eval  Flexion Limited by 25%  Extension WFL  Right lateral flexion Limited by 25%  Left lateral flexion Limited by 25%  Right rotation WFL  Left rotation WFL   (Blank rows = not tested)  LOWER EXTREMITY ROM:  Bil WFL LOWER EXTREMITY MMT:   Bil hips 4/5 grossly, knees and ankles 5/5   PALPATION:   General  no TTP, mild fascial restrictions noted in lower abdominal quadrants and over bladder                External Perineal Exam no TTP, dryness noted externally                             Internal Pelvic Floor no TTP however increased tension noted throughout  Patient confirms identification and approves PT to assess internal pelvic floor and treatment Yes  PELVIC MMT:   MMT eval 06/03/22  07/06/22  07/20/22   Vaginal 1/5 with max cues and quick release; without cues 0/5; 1s; 1 rep 1/5 initially but with quick release 2/5.   2/5 with minimal cues 2/5 - however with palpation of superficial muscles pt reported pain with palpation at bulbocavernosus bil and had several small trigger points.   Internal Anal Sphincter      External Anal Sphincter      Puborectalis      Diastasis Recti      (Blank rows = not tested)        TONE: Mildly increased  PROLAPSE: Not seen in hooklying   TODAY'S TREATMENT   08/17/2022: Trigger Point Dry-Needling  Treatment instructions: Expect mild to moderate muscle soreness. S/S of pneumothorax if dry needled over a  lung field, and to seek  immediate medical attention should they occur. Patient verbalized understanding of these instructions and education. Patient Consent Given: Yes Education handout provided: Yes Muscles treated: Right hip adductors, Left glute/piriformis, left hamstring, right hamstring Electrical stimulation performed: No Parameters: N/A Treatment response/outcome: Utilized skilled palpation to identify trigger points.  Able to palpate muscle twitch and muscle elongation following. Manual Therapy:  Soft tissue mobilization to left hamstring following dry needling to promote further muscle elongation    08/06/22  Pt educated on different options for dilators, oh nut product and breathing/relaxing options for pelvis. Pt also educated on dry needling.  Manual work: at bil adductors and Rt hip flexors with much more tension at Rt side compared to LT and pt had more TTP at right side as well. Manual work provided within pt tolerance and did demonstrate improved mobility slightly but noted tension remained and continued into proximal adductors and pelvis. Manual also at external pelvic floor at UG triangle release with noted TTP at proximal adductors and bulbocavernosus and ischiocavernosus.  Pt also educated on dry needling and handout given.   PATIENT EDUCATION:  Education details: [id number], urge drill  Person educated: Patient Education method: Explanation, Demonstration, Tactile cues, Verbal cues, and Handouts Education comprehension: verbalized understanding and returned demonstration   HOME EXERCISE PROGRAM: [id number]  ASSESSMENT:  CLINICAL IMPRESSION: Patient presents to skilled PT agreeable to trying dry needling for treatment session.  Pt with strong twitch responses to dry needling and reported muscle elongation following.  Pt reports improved quality of ambulation following dry needling.  Pt provided with education on dry needling and verbalizes understanding and states that she watched some videos  over the weekend.  Pt continues to require skilled PT to progress towards goal related activities.   OBJECTIVE IMPAIRMENTS decreased coordination, decreased endurance, decreased mobility, decreased strength, increased fascial restrictions, increased muscle spasms, impaired flexibility, improper body mechanics, postural dysfunction, and pain.   ACTIVITY LIMITATIONS continence, locomotion level, and intercourse  PARTICIPATION LIMITATIONS: interpersonal relationship and community activity  PERSONAL FACTORS Past/current experiences, Time since onset of injury/illness/exacerbation, and 1 comorbidity: medical history  are also affecting patient's functional outcome.   REHAB POTENTIAL: Good  CLINICAL DECISION MAKING: Stable/uncomplicated  EVALUATION COMPLEXITY: Low   GOALS: Goals reviewed with patient? Yes  SHORT TERM GOALS: Target date: 05/26/2022  Pt to be I with HEP.  Baseline: Goal status: MET  2.  Pt to be I with use of feminine moisturizers and lubricants as needed for improved vaginal health and decreased pain with penetration.  Baseline:  Goal status: MET  3.  Pt to demonstrated improved coordination with pelvic floor and breathing mechanics with body weight squats without leakage or pain for improved pelvic stability.  Baseline:  Goal status: MET   LONG TERM GOALS: Target date:  07/29/22  >10/27/21 extension   Pt to be I with advanced HEP.  Baseline:  Goal status: MET  2.  Pt to demonstrate at least 5/5 bil hip strength for improved pelvic stability and functional squats without leakage.  Baseline:  Goal status: MET  3. Pt to demonstrate at least 3/5 pelvic floor strength for improved pelvic stability and decreased strain at pelvic floor/ decrease leakage.  Baseline:  Goal status: on going - focus on recent sessions has been decreasing internal pelvic pain with intercourse however has shown progress toward this goal but last finding 2/5.   4.  Pt to report improved  time between bladder voids to at least 2.5 hours  for improved QOL with decreased urinary frequency.   Baseline:  Goal status: met  5.  Pt to report no more than 2/10 pain with vaginal penetration for improved QOL and decreased pain with medical procedures. Baseline:  Goal status: on going - has been improving but not consistently 2 or less/10. At worse 5/10 without extra time for penetration but with extra time and lubrication 3-4/10.     PLAN: PT FREQUENCY: 1x/week  PT DURATION:  6 sessions 07/27/22 additional from recert  PLANNED INTERVENTIONS: Therapeutic exercises, Therapeutic activity, Neuromuscular re-education, Patient/Family education, Self Care, Joint mobilization, Aquatic Therapy, Dry Needling, Spinal mobilization, Cryotherapy, Moist heat, scar mobilization, Taping, Biofeedback, and Manual therapy  PLAN FOR NEXT SESSION: core/hip strengthening with cues for pelvic floor contraction if able, internal manual release as needed, assess response to dry needling   Juel Burrow, PT, DPT 08/17/22 8:24 AM   [facility] [address], [place], [place] [address] Phone # [phone] Fax [phone]

## 2022-08-17 NOTE — Patient Instructions (Signed)

## 2022-08-27 ENCOUNTER — Ambulatory Visit: Payer: BC Managed Care – PPO | Admitting: Physical Therapy

## 2022-09-02 ENCOUNTER — Ambulatory Visit: Payer: BC Managed Care – PPO | Admitting: Physical Therapy

## 2022-09-03 ENCOUNTER — Ambulatory Visit: Payer: BC Managed Care – PPO | Attending: Gynecologic Oncology | Admitting: Physical Therapy

## 2022-09-03 DIAGNOSIS — M6281 Muscle weakness (generalized): Secondary | ICD-10-CM | POA: Insufficient documentation

## 2022-09-03 DIAGNOSIS — M62838 Other muscle spasm: Secondary | ICD-10-CM | POA: Insufficient documentation

## 2022-09-03 DIAGNOSIS — R293 Abnormal posture: Secondary | ICD-10-CM | POA: Diagnosis present

## 2022-09-03 DIAGNOSIS — R279 Unspecified lack of coordination: Secondary | ICD-10-CM | POA: Diagnosis present

## 2022-09-03 NOTE — Therapy (Signed)
OUTPATIENT PHYSICAL THERAPY FEMALE PELVIC TREATMENT   Patient Name: Hannah Woodard MRN: [id number] DOB:12-Nov-1970, 51 y.o., female Today's Date: 09/03/2022   PT End of Session - 09/03/22 1624     Visit Number 16    Date for PT Re-Evaluation 10/27/22    Authorization Type BCBS    PT Start Time 1621   pt arrival time   PT Stop Time 1655    PT Time Calculation (min) 34 min    Activity Tolerance Patient tolerated treatment well    Behavior During Therapy [facility] for tasks assessed/performed               Past Medical History:  Diagnosis Date   Breast cancer ([place])    Family history of ovarian cancer 09/02/2017   Family history of prostate cancer in father 09/02/2017   Family history of uterine cancer 09/02/2017   Plantar fasciitis    Past Surgical History:  Procedure Laterality Date   PORT-A-CATH REMOVAL Left 2020   right lumpectomy, sentinel node biopsy, and bilateral mamm[facility]  10[facility]   Patient Active Problem List   Diagnosis Date Noted   Overweight (BMI 25.0-29.9) 12/31/2020   Hyperlipidemia 12/30/2020   Neuropathy due to chemotherapeutic drug ([place]) 12/31/2017   Port-A-Cath in place 11/12/2017   Genetic testing 09/15/2017   Malignant neoplasm of upper-inner quadrant of right breast in female, estrogen receptor positive ([place]) 08/27/2017   Vitamin D deficiency 12/09/2015   Medication management 12/09/2015   Dense breast tissue 12/09/2015    PCP: Hayden Rasmussen, MD  REFERRING PROVIDER: Dorothyann Gibbs, NP  REFERRING DIAG:  R35.0 (ICD-10-CM) - Urinary frequency N94.10 (ICD-10-CM) - Dyspareunia in female T29.6X5A (ICD-10-CM) - Adverse effect of antigonadotrophins, antiestrogens, antiandrogens, not elsewhere classified, initial encounter  THERAPY DIAG:  Muscle weakness (generalized)  Unspecified lack of coordination  Other muscle spasm  Rationale for Evaluation and Treatment Rehabilitation  ONSET DATE: years  SUBJECTIVE:                                                                                                                                                                                            SUBJECTIVE STATEMENT: Pt report normal urinary frequency and able to make it 3 hours now. Pt hasn't been active to know about improvement.   Fluid intake: Yes: 60 oz water, tea sometimes but not regular       PAIN:  Are you having pain? No - but with intercourse  NPRS scale: 3-5/10 Pain location:  intercourse, internally vaginally.  Pain reported in feet and thighs   PRECAUTIONS: None  WEIGHT BEARING RESTRICTIONS No  FALLS:  Has patient fallen in last 6 months? 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  PLOF: Independent  PATIENT GOALS to have less pain, more regular voids  PERTINENT HISTORY:  personal history of triple positive breast cancer at age 70 and family history of ovarian cancer, BRCA negative on genetic testing Vaginal atrophy Malignant neoplasm of upper-inner quadrant of right breast in female, estrogen receptor positive   Sexual abuse: No  BOWEL MOVEMENT Pain with bowel movement: No Type of bowel movement:Type (Bristol Stool Scale) 2-3, Frequency daily, and Strain Yes but this has improved since starting using step stool in bathroom Fully empty rectum: No Leakage: No Pads: No Fiber supplement: Yes:    URINATION Pain with urination: No sometimes, rarely will have a burning Fully empty bladder: No Stream: Strong and Weak Urgency: Yes: consistently Frequency: 2 hours sometimes sooner, usually gets up at night 1x Leakage: Coughing and Sneezing Pads: No  INTERCOURSE Pain with intercourse: Initial Penetration, During Penetration, After Intercourse, and Pain Interrupts Intercourse Ability to have vaginal penetration:  Yes:   Climax: not painful but pain prevents climax sometimes Marinoff Scale:  2/3  PREGNANCY Vaginal deliveries 0 Tearing No C-section deliveries 0 Currently pregnant No  PROLAPSE None    OBJECTIVE:   DIAGNOSTIC FINDINGS:   COGNITION:  Overall cognitive status: Within functional limits for tasks assessed     SENSATION:  Light touch: Appears intact   Proprioception: Appears intact  MUSCLE LENGTH: Bil hamstring and adductor limited by 25%                POSTURE: rounded shoulders and forward head    LUMBARAROM/PROM  A/PROM A/PROM  eval  Flexion Limited by 25%  Extension WFL  Right lateral flexion Limited by 25%  Left lateral flexion Limited by 25%  Right rotation WFL  Left rotation WFL   (Blank rows = not tested)  LOWER EXTREMITY ROM:  Bil WFL LOWER EXTREMITY MMT:   Bil hips 4/5 grossly, knees and ankles 5/5   PALPATION:   General  no TTP, mild fascial restrictions noted in lower abdominal quadrants and over bladder                External Perineal Exam no TTP, dryness noted externally                             Internal Pelvic Floor no TTP however increased tension noted throughout  Patient confirms identification and approves PT to assess internal pelvic floor and treatment Yes  PELVIC MMT:   MMT eval 06/03/22  07/06/22  07/20/22   Vaginal 1/5 with max cues and quick release; without cues 0/5; 1s; 1 rep 1/5 initially but with quick release 2/5.   2/5 with minimal cues 2/5 - however with palpation of superficial muscles pt reported pain with palpation at bulbocavernosus bil and had several small trigger points.   Internal Anal Sphincter      External Anal Sphincter      Puborectalis      Diastasis Recti      (Blank rows = not tested)        TONE: Mildly increased  PROLAPSE: Not seen in hooklying   TODAY'S TREATMENT  09/03/22: Pt requests to do stretching for hips today as she has done strengthening this morning.  Butterfly stretch bil 3x30s Hip IR stretch 3x30s bil  Hamstring stretch 3x30s each ITB stretch 3x30s  each   08/17/2022: Trigger Point Dry-Needling  Treatment instructions: Expect mild to moderate muscle soreness. S/S of pneumothorax if dry needled over a lung field, and to seek immediate medical attention should they occur. Patient verbalized understanding of these instructions and education. Patient Consent Given: Yes Education handout provided: Yes Muscles treated: Right hip adductors, Left glute/piriformis, left hamstring, right hamstring Electrical stimulation performed: No Parameters: N/A Treatment response/outcome: Utilized skilled palpation to identify trigger points.  Able to palpate muscle twitch and muscle elongation following. Manual Therapy:  Soft tissue mobilization to left hamstring following dry needling to promote further muscle elongation     PATIENT EDUCATION:  Education details: [id number], urge drill  Person educated: Patient Education method: Explanation, Demonstration, Tactile cues, Verbal cues, and Handouts Education comprehension: verbalized understanding and returned demonstration   HOME EXERCISE PROGRAM: [id number]  ASSESSMENT:  CLINICAL IMPRESSION: Patient session focused on hip stretching and relaxation techniques for improved pelvic mobility and decreased pain with intercourse. Pt has two additional visits for dry needling to assist in mobility at proximal hips to decreased pain with intercourse as pt is still limited with this. Pt continues to require skilled PT to progress towards goal related activities.   OBJECTIVE IMPAIRMENTS decreased coordination, decreased endurance, decreased mobility, decreased strength, increased fascial restrictions, increased muscle spasms, impaired flexibility, improper body mechanics, postural dysfunction, and pain.   ACTIVITY LIMITATIONS continence, locomotion level, and intercourse  PARTICIPATION LIMITATIONS: interpersonal relationship and community activity  PERSONAL FACTORS Past/current experiences, Time since onset of  injury/illness/exacerbation, and 1 comorbidity: medical history  are also affecting patient's functional outcome.   REHAB POTENTIAL: Good  CLINICAL DECISION MAKING: Stable/uncomplicated  EVALUATION COMPLEXITY: Low   GOALS: Goals reviewed with patient? Yes  SHORT TERM GOALS: Target date: 05/26/2022  Pt to be I with HEP.  Baseline: Goal status: MET  2.  Pt to be I with use of feminine moisturizers and lubricants as needed for improved vaginal health and decreased pain with penetration.  Baseline:  Goal status: MET  3.  Pt to demonstrated improved coordination with pelvic floor and breathing mechanics with body weight squats without leakage or pain for improved pelvic stability.  Baseline:  Goal status: MET   LONG TERM GOALS: Target date:  07/29/22  >10/27/21 extension   Pt to be I with advanced HEP.  Baseline:  Goal status: MET  2.  Pt to demonstrate at least 5/5 bil hip strength for improved pelvic stability and functional squats without leakage.  Baseline:  Goal status: MET  3. Pt to demonstrate at least 3/5 pelvic floor strength for improved pelvic stability and decreased strain at pelvic floor/ decrease leakage.  Baseline:  Goal status: on going - focus on recent sessions has been decreasing internal pelvic pain with intercourse however has shown progress toward this goal but last finding 2/5.   4.  Pt to report improved time between bladder voids to at least 2.5 hours for improved QOL with decreased urinary frequency.   Baseline:  Goal status: met  5.  Pt to report no more than 2/10 pain with vaginal penetration for improved QOL and decreased pain with medical procedures. Baseline:  Goal status: on going - has been improving but not consistently 2 or less/10. At worse 5/10 without extra time for penetration but with extra time and lubrication 3-4/10.     PLAN: PT FREQUENCY: 1x/week  PT DURATION:  6 sessions 07/27/22 additional from recert  PLANNED  INTERVENTIONS: Therapeutic  exercises, Therapeutic activity, Neuromuscular re-education, Patient/Family education, Self Care, Joint mobilization, Aquatic Therapy, Dry Needling, Spinal mobilization, Cryotherapy, Moist heat, scar mobilization, Taping, Biofeedback, and Manual therapy  PLAN FOR NEXT SESSION: core/hip strengthening with cues for pelvic floor contraction if able, internal manual release as needed, assess response to dry needling   Stacy Gardner, PT, DPT 12/07/235:04 PM    [facility] [address] [place], [place] [address] Phone # [phone] Fax [phone]

## 2022-09-06 ENCOUNTER — Encounter: Payer: Self-pay | Admitting: Gynecologic Oncology

## 2022-09-07 ENCOUNTER — Encounter: Payer: Self-pay | Admitting: Physical Medicine and Rehabilitation

## 2022-09-07 ENCOUNTER — Encounter
Payer: BC Managed Care – PPO | Attending: Physical Medicine and Rehabilitation | Admitting: Physical Medicine and Rehabilitation

## 2022-09-07 VITALS — BP 101/69 | HR 72 | Temp 98.4°F | Ht 65.0 in | Wt 164.0 lb

## 2022-09-07 DIAGNOSIS — T451X5A Adverse effect of antineoplastic and immunosuppressive drugs, initial encounter: Secondary | ICD-10-CM | POA: Diagnosis present

## 2022-09-07 DIAGNOSIS — G620 Drug-induced polyneuropathy: Secondary | ICD-10-CM | POA: Diagnosis present

## 2022-09-07 MED ORDER — CAPSAICIN-CLEANSING GEL 8 % EX KIT
2.0000 | PACK | Freq: Once | CUTANEOUS | Status: AC
  Administered 2022-09-07: 2 via TOPICAL

## 2022-09-07 NOTE — Progress Notes (Addendum)
Subjective:    Patient ID: Hannah Woodard, female    DOB: [birth year], 51 y.o.   MRN: [id number]  HPI Hannah Woodard is a 51 year old woman who presents for follow-up of athralgias secondary to chemotherapy.   1) Chemotherapy induced athralgias -she uses CBD oil with menthol and capsaicin and ice socks but these are sometimes too cold for her. Has not needed this as often -got 12 weeks relief from [place] after the first two applications, but she was not sure if it was efficacious last time.  -she continues to take gabapentin -her dog ate one of her orthotics and this worsened her pain recently -she continues to take liposomal vitamin C and 10,000U vitamin D per day.  -most pain was in the ball of the feet and big toes.  -she felt most benefit from the first [place] application, she felt less benefit from the last one. She has some burning the day of application but no uncomfortable burning afterward. After the more recent applications she has had a few days of burning.  -She thinks her pain may have been a little worse because she has been active.  -she asks whether she can go for a walk today, she got a new puppy -she used oral CBD with chem -Initially pain about 8 at worst in the mornings, now down to 5/10.  -she feels traditional medicine is still not open to herbal medicine -the neuropathy started with breast cancer chemotherapy.  -pain right now is 4/10 -pain is intermittent.  -has never tried Pre-Dx -she was on Taxol and Herceptin.  -she hates that she is on any drugs -she thinks she eats healthy and she takes vitamins -pain is mostly present on dorsum of both feet, as well as balls and heels. -charlie horses improved -she gets liposomal vitamin C  2) High iron level -she was told not to take iron supplement since her level was too high.   3) Hot flashes -continues to take gabapentin for these -not as bad -helps to stay away from caffeine, spicy foods, chocolate  4)  Rash -she easily gets rashes   5) Poison Ivy: -she recently got while gardening.   Pain Inventory Average Pain 3 Pain Right Now 4 My pain is intermittent  LOCATION OF PAIN  feet,toes,leg,neck,thigh,fingers,back,hip,neck  BOWEL Number of stools per week: 7 Oral laxative use fiber powder Type of laxative  Enema or suppository use No  History of colostomy No  Incontinent No   BLADDER Normal  Able to self cath No  Bladder incontinence No  Frequent urination No  Leakage with coughing No  Difficulty starting stream No  Incomplete bladder emptying Yes    Mobility walk without assistance ability to climb steps?  yes do you drive?  yes  Function employed # of hrs/week 37.5  Neuro/Psych numbness tingling  Prior Studies N/a  Physicians involved in your care N/a   Family History  Problem Relation Age of Onset   Ovarian cancer Mother 84   Prostate cancer Father 72       'high gleason' unsure number   Ulcerative colitis Sister    Other Sister        abn ovaian growth, partial hysterectomy   Basal cell carcinoma Sister 83   Uterine cancer Maternal Aunt 55   Stroke Maternal Aunt 66   Basal cell carcinoma Brother    Skin cancer Paternal Uncle    Skin cancer Maternal Grandmother    Stroke Paternal Grandfather 27  Hydrocephalus Cousin    Social History   Socioeconomic History   Marital status: Married    Spouse name: Not on file   Number of children: Not on file   Years of education: Not on file   Highest education level: Not on file  Occupational History   Occupation: language facilitator  Tobacco Use   Smoking status: Never   Smokeless tobacco: Never  Vaping Use   Vaping Use: Never used  Substance and Sexual Activity   Alcohol use: Not Currently    Comment: rarely    Drug use: No   Sexual activity: Yes    Partners: Male    Birth control/protection: Other-see comments    Comment: chemical menopaus  Other Topics Concern   Not on file  Social  History Narrative   Not on file   Social Determinants of Health   Financial Resource Strain: Not on file  Food Insecurity: Not on file  Transportation Needs: Not on file  Physical Activity: Not on file  Stress: Not on file  Social Connections: Not on file   Past Surgical History:  Procedure Laterality Date   PORT-A-CATH REMOVAL Left 2020   right lumpectomy, sentinel node biopsy, and bilateral mammoplasty  10/2017   [facility]   Past Medical History:  Diagnosis Date   Breast cancer ([place])    Family history of ovarian cancer 09/02/2017   Family history of prostate cancer in father 09/02/2017   Family history of uterine cancer 09/02/2017   Plantar fasciitis    BP 101/69   Pulse 72   Temp 98.4 F (36.9 C)   Ht '5\' 5"'$  (1.651 m)   Wt 164 lb (74.4 kg)   LMP 10/05/2017   SpO2 98%   BMI 27.29 kg/m   Opioid Risk Score:   Fall Risk Score:  `1  Depression screen [facility] 2/9     09/07/2022    9:29 AM 12/09/2021    2:05 PM 09/02/2021    3:09 PM 08/26/2021    9:51 AM 12/31/2020    2:42 PM 01/27/2018    9:17 AM  Depression screen PHQ 2/9  Decreased Interest 0 0 0 0 0 0  Down, Depressed, Hopeless 0 0 0 1 0 0  PHQ - 2 Score 0 0 0 1 0 0  Altered sleeping    2    Tired, decreased energy    2    Change in appetite    0    Feeling bad or failure about yourself     1    Trouble concentrating    0    Moving slowly or fidgety/restless    0    Suicidal thoughts    0    PHQ-9 Score    6         Review of Systems  Constitutional:  Positive for chills, fever and unexpected weight change.  HENT: Negative.    Eyes: Negative.   Respiratory: Negative.    Cardiovascular: Negative.  Negative for chest pain.  Endocrine: Negative.   Genitourinary: Negative.   Musculoskeletal: Negative.   Skin: Negative.   Allergic/Immunologic: Negative.   Neurological: Negative.   Hematological: Negative.   Psychiatric/Behavioral: Negative.         Objective:   Physical Exam Gen: no distress, normal  appearing, weight 164 lbs, BMI 27.29, BP 101/69 Gen: no distress, normal appearing HEENT: oral mucosa pink and moist, NCAT Cardio: Reg rate Chest: normal effort, normal rate of breathing Abd: soft, non-distended Ext:  no edema Psych: pleasant, normal affect Skin: no open lesions on feet Neuro: Alert and oriented x3.  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  -Discussed benefits of exercise in reducing pain. -continue CBD oil -commended on trying ashwagandha -Discussed Qutenza as an option for neuropathic pain control. Discussed that this is a capsaicin patch, stronger than capsaicin cream. Discussed that it is currently approved for diabetic peripheral neuropathy and post-herpetic neuralgia, but that it has also shown benefit in treating other forms of neuropathy. Provided patient with link to site to learn more about the patch: https://www.clark.biz/. Discussed that the patch would be placed in office and benefits usually last 3 months. Discussed that unintended exposure to capsaicin can cause severe irritation of eyes, mucous membranes, respiratory tract, and skin, but that Qutenza is a local treatment and does not have the systemic side effects of other nerve medications. Discussed that there may be pain, itching, erythema, and decreased sensory function associated with the application of Qutenza. Side effects usually subside within 1 week. A cold pack of analgesic medications can help with these side effects. Blood pressure can also be increased due to pain associated with administration of the patch.   Prescribed Zynex Nexwave   2 patches of Qutenza was applied to the area of pain. Ice packs were applied during the procedure to ensure patient comfort. Blood pressure was monitored every 15 minutes. The patient tolerated the procedure  well. Post-procedure instructions were given and follow-up has been scheduled.      -Discussed current symptoms of pain and history of pain.  -Discussed benefits of exercise in reducing pain. -Discussed following foods that may reduce pain: 1) Ginger (especially studied for arthritis)- reduce leukotriene production to decrease inflammation 2) Blueberries- high in phytonutrients that decrease inflammation 3) Salmon- marine omega-3s reduce joint swelling and pain 4) Pumpkin seeds- reduce inflammation 5) dark chocolate- reduces inflammation 6) turmeric- reduces inflammation 7) tart cherries - reduce pain and stiffness 8) extra virgin olive oil - its compound olecanthal helps to block prostaglandins  9) chili peppers- can be eaten or applied topically via capsaicin 10) mint- helpful for headache, muscle aches, joint pain, and itching 11) garlic- reduces inflammation  Link to further information on diet for chronic pain: http://www.bray.com/   2) Hot flashes: -continue gabapentin PRN -discussed that she is on such a low dose that she does not need to wean off.   3) Overweight: -Educated that current weight is 164 lbs and current BMI is 27.43, lost 6 lbs over last 2 visits -discussed that decreasing weight can help decrease weight on joints, and thus lessen pain.  -Educated regarding health benefits of weight loss- for pain, general health, chronic disease prevention, immune health, mental health.  -encouraged higher protein, lower bread.  -Will monitor weight every visit.  -Consider [place] tea daily.  -Discussed the benefits of intermittent fasting. -Discussed foods that can assist in weight loss: 1) leafy greens- high in fiber and nutrients 2) dark chocolate- improves metabolism (if prefer sweetened, best to sweeten with honey instead of sugar).  3) cruciferous vegetables- high in fiber and protein 4) full fat  yogurt: high in healthy fat, protein, calcium, and probiotics 5) apples- high in a variety of phytochemicals 6) nuts- high in fiber and protein that increase feelings of fullness 7) grapefruit: rich in nutrients, antioxidants, and fiber (not to be taken  with anticoagulation) 8) beans- high in protein and fiber 9) salmon- has high quality protein and healthy fats 10) green tea- rich in polyphenols 11) eggs- rich in choline and vitamin D 12) tuna- high protein, boosts metabolism 13) avocado- decreases visceral abdominal fat 14) chicken (pasture raised): high in protein and iron 15) blueberries- reduce abdominal fat and cholesterol 16) whole grains- decreases calories retained during digestion, speeds metabolism 17) chia seeds- curb appetite 18) chilies- increases fat metabolism  -Discussed supplements that can be used:  1) Metatrim 400mg  BID 30 minutes before breakfast and dinner  2) Sphaeranthus indicus and Garcinia mangostana (combinations of these and #1 can be found in capsicum and zychrome  3) green coffee bean extract 400mg  twice per day or Irvingia (african mango) 150 to 300mg  twice per day.

## 2022-09-08 ENCOUNTER — Telehealth: Payer: Self-pay | Admitting: *Deleted

## 2022-09-08 NOTE — Telephone Encounter (Signed)
Per Melissa APP patient scheduled for a lab on 12//22 before her [place] scan, and a follow up appt with Melissa APP on 6/11. Patient aware

## 2022-09-10 ENCOUNTER — Ambulatory Visit: Payer: BC Managed Care – PPO | Admitting: Physical Medicine and Rehabilitation

## 2022-09-15 ENCOUNTER — Encounter: Payer: Self-pay | Admitting: Physical Therapy

## 2022-09-15 ENCOUNTER — Ambulatory Visit: Payer: BC Managed Care – PPO | Admitting: Physical Therapy

## 2022-09-15 DIAGNOSIS — M6281 Muscle weakness (generalized): Secondary | ICD-10-CM

## 2022-09-15 DIAGNOSIS — R293 Abnormal posture: Secondary | ICD-10-CM

## 2022-09-15 DIAGNOSIS — M62838 Other muscle spasm: Secondary | ICD-10-CM

## 2022-09-15 DIAGNOSIS — R279 Unspecified lack of coordination: Secondary | ICD-10-CM

## 2022-09-15 NOTE — Therapy (Signed)
OUTPATIENT PHYSICAL THERAPY FEMALE PELVIC TREATMENT   Patient Name: Hannah Woodard MRN: [id number] DOB:August 21, 1971, 51 y.o., female Today's Date: 09/15/2022   PT End of Session - 09/15/22 0803     Visit Number 17    Date for PT Re-Evaluation 10/27/22    Authorization Type BCBS    PT Start Time 0803    PT Stop Time 0845    PT Time Calculation (min) 42 min    Activity Tolerance Patient tolerated treatment well    Behavior During Therapy [facility] for tasks assessed/performed                Past Medical History:  Diagnosis Date   Breast cancer ([place])    Family history of ovarian cancer 09/02/2017   Family history of prostate cancer in father 09/02/2017   Family history of uterine cancer 09/02/2017   Plantar fasciitis    Past Surgical History:  Procedure Laterality Date   PORT-A-CATH REMOVAL Left 2020   right lumpectomy, sentinel node biopsy, and bilateral mammoplasty  10/2017   [facility]   Patient Active Problem List   Diagnosis Date Noted   Overweight (BMI 25.0-29.9) 12/31/2020   Hyperlipidemia 12/30/2020   Neuropathy due to chemotherapeutic drug ([place]) 12/31/2017   Port-A-Cath in place 11/12/2017   Genetic testing 09/15/2017   Malignant neoplasm of upper-inner quadrant of right breast in female, estrogen receptor positive ([place]) 08/27/2017   Vitamin D deficiency 12/09/2015   Medication management 12/09/2015   Dense breast tissue 12/09/2015    PCP: Hayden Rasmussen, MD  REFERRING PROVIDER: Dorothyann Gibbs, NP  REFERRING DIAG:  R35.0 (ICD-10-CM) - Urinary frequency N94.10 (ICD-10-CM) - Dyspareunia in female T79.6X5A (ICD-10-CM) - Adverse effect of antigonadotrophins, antiestrogens, antiandrogens, not elsewhere classified, initial encounter  THERAPY DIAG:  Muscle weakness (generalized)  Unspecified lack of coordination  Other muscle spasm  Abnormal posture  Rationale for Evaluation and Treatment Rehabilitation  ONSET DATE: years  SUBJECTIVE:                                                                                                                                                                                            SUBJECTIVE STATEMENT: My right inner thigh is tighter than my left and is tender to the touch.  I did ok with DN last time I had it.    Fluid intake: Yes: 60 oz water, tea sometimes but not regular       PAIN:  Are you having pain? No - but with intercourse  NPRS scale: 3-4/10 Rt>Lt inner thigh Pain location:  intercourse, internally vaginally.  Pain reported in feet and thighs  PRECAUTIONS: None  WEIGHT BEARING RESTRICTIONS No  FALLS:  Has patient fallen in last 6 months? 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  PLOF: Independent  PATIENT GOALS to have less pain, more regular voids  PERTINENT HISTORY:  personal history of triple positive breast cancer at age 29 and family history of ovarian cancer, BRCA negative on genetic testing Vaginal atrophy Malignant neoplasm of upper-inner quadrant of right breast in female, estrogen receptor positive   Sexual abuse: No  BOWEL MOVEMENT Pain with bowel movement: No Type of bowel movement:Type (Bristol Stool Scale) 2-3, Frequency daily, and Strain Yes but this has improved since starting using step stool in bathroom Fully empty rectum: No Leakage: No Pads: No Fiber supplement: Yes:    URINATION Pain with urination: No sometimes, rarely will have a burning Fully empty bladder: No Stream: Strong and Weak Urgency: Yes: consistently Frequency: 2 hours sometimes sooner, usually gets up at night 1x Leakage: Coughing and Sneezing Pads: No  INTERCOURSE Pain with intercourse: Initial Penetration, During Penetration, After Intercourse, and Pain Interrupts Intercourse Ability to have vaginal penetration:  Yes:   Climax: not painful but pain prevents climax  sometimes Marinoff Scale: 2/3  PREGNANCY Vaginal deliveries 0 Tearing No C-section deliveries 0 Currently pregnant No  PROLAPSE None    OBJECTIVE:   DIAGNOSTIC FINDINGS:   COGNITION:  Overall cognitive status: Within functional limits for tasks assessed     SENSATION:  Light touch: Appears intact   Proprioception: Appears intact  MUSCLE LENGTH: Bil hamstring and adductor limited by 25%                POSTURE: rounded shoulders and forward head    LUMBARAROM/PROM  A/PROM A/PROM  eval  Flexion Limited by 25%  Extension WFL  Right lateral flexion Limited by 25%  Left lateral flexion Limited by 25%  Right rotation WFL  Left rotation WFL   (Blank rows = not tested)  LOWER EXTREMITY ROM:  Bil WFL LOWER EXTREMITY MMT:   Bil hips 4/5 grossly, knees and ankles 5/5   PALPATION:   General  no TTP, mild fascial restrictions noted in lower abdominal quadrants and over bladder                External Perineal Exam no TTP, dryness noted externally                             Internal Pelvic Floor no TTP however increased tension noted throughout  Patient confirms identification and approves PT to assess internal pelvic floor and treatment Yes  PELVIC MMT:   MMT eval 06/03/22  07/06/22  07/20/22   Vaginal 1/5 with max cues and quick release; without cues 0/5; 1s; 1 rep 1/5 initially but with quick release 2/5.   2/5 with minimal cues 2/5 - however with palpation of superficial muscles pt reported pain with palpation at bulbocavernosus bil and had several small trigger points.   Internal Anal Sphincter      External Anal Sphincter      Puborectalis      Diastasis Recti      (Blank rows = not tested)        TONE: Mildly increased  PROLAPSE: Not seen in hooklying   TODAY'S TREATMENT  09/15/22: Trigger Point Dry-Needling  Treatment instructions: Expect mild to moderate muscle soreness. S/S of pneumothorax if dry  needled over a lung field, and to seek  immediate medical attention should they occur. Patient verbalized understanding of these instructions and education.  Patient Consent Given: Yes Education handout provided: Previously provided Muscles treated: bil lumbar multifidi, bil adductors Electrical stimulation performed: No Parameters: N/A Treatment response/outcome: deep ache and twitch Rt > Lt lumbar, and bil adductors radiating to pubic ramus and medial knee STM and passive stretching to adductors and Rt lumbar multifidi after DN  09/03/22: Pt requests to do stretching for hips today as she has done strengthening this morning.  Butterfly stretch bil 3x30s Hip IR stretch 3x30s bil Hamstring stretch 3x30s each ITB stretch 3x30s each   08/17/2022: Trigger Point Dry-Needling  Treatment instructions: Expect mild to moderate muscle soreness. S/S of pneumothorax if dry needled over a lung field, and to seek immediate medical attention should they occur. Patient verbalized understanding of these instructions and education. Patient Consent Given: Yes Education handout provided: Yes Muscles treated: Right hip adductors, Left glute/piriformis, left hamstring, right hamstring Electrical stimulation performed: No Parameters: N/A Treatment response/outcome: Utilized skilled palpation to identify trigger points.  Able to palpate muscle twitch and muscle elongation following. Manual Therapy:  Soft tissue mobilization to left hamstring following dry needling to promote further muscle elongation     PATIENT EDUCATION:  Education details: [id number], urge drill  Person educated: Patient Education method: Explanation, Demonstration, Tactile cues, Verbal cues, and Handouts Education comprehension: verbalized understanding and returned demonstration   HOME EXERCISE PROGRAM: [id number]  ASSESSMENT:  CLINICAL IMPRESSION: Pt with continued TP and hypertonicity in bil adductors and Rt lumbar mutlfidi.  She had signif twitch and radiating pain  with bil adductors to pubic ramus and medial knee, Rt>Lt.  Time did not allow for Rt lateral hip DN today so will benefit from that next visit for DN.   Pt continues be compliant with hip stretching and relaxation techniques for improved pelvic mobility and decreased pain with intercourse. Pt continues to require skilled PT to progress towards goal related activities.   OBJECTIVE IMPAIRMENTS decreased coordination, decreased endurance, decreased mobility, decreased strength, increased fascial restrictions, increased muscle spasms, impaired flexibility, improper body mechanics, postural dysfunction, and pain.   ACTIVITY LIMITATIONS continence, locomotion level, and intercourse  PARTICIPATION LIMITATIONS: interpersonal relationship and community activity  PERSONAL FACTORS Past/current experiences, Time since onset of injury/illness/exacerbation, and 1 comorbidity: medical history  are also affecting patient's functional outcome.   REHAB POTENTIAL: Good  CLINICAL DECISION MAKING: Stable/uncomplicated  EVALUATION COMPLEXITY: Low   GOALS: Goals reviewed with patient? Yes  SHORT TERM GOALS: Target date: 05/26/2022  Pt to be I with HEP.  Baseline: Goal status: MET  2.  Pt to be I with use of feminine moisturizers and lubricants as needed for improved vaginal health and decreased pain with penetration.  Baseline:  Goal status: MET  3.  Pt to demonstrated improved coordination with pelvic floor and breathing mechanics with body weight squats without leakage or pain for improved pelvic stability.  Baseline:  Goal status: MET   LONG TERM GOALS: Target date:  07/29/22  >10/27/21 extension   Pt to be I with advanced HEP.  Baseline:  Goal status: MET  2.  Pt to demonstrate at least 5/5 bil hip strength for improved pelvic stability and functional squats without leakage.  Baseline:  Goal status: MET  3. Pt to demonstrate at least 3/5 pelvic floor strength for improved pelvic stability and  decreased strain at pelvic floor/ decrease leakage.  Baseline:  Goal status: on going -  focus on recent sessions has been decreasing internal pelvic pain with intercourse however has shown progress toward this goal but last finding 2/5.   4.  Pt to report improved time between bladder voids to at least 2.5 hours for improved QOL with decreased urinary frequency.   Baseline:  Goal status: met  5.  Pt to report no more than 2/10 pain with vaginal penetration for improved QOL and decreased pain with medical procedures. Baseline:  Goal status: on going - has been improving but not consistently 2 or less/10. At worse 5/10 without extra time for penetration but with extra time and lubrication 3-4/10.     PLAN: PT FREQUENCY: 1x/week  PT DURATION:  6 sessions 07/27/22 additional from recert  PLANNED INTERVENTIONS: Therapeutic exercises, Therapeutic activity, Neuromuscular re-education, Patient/Family education, Self Care, Joint mobilization, Aquatic Therapy, Dry Needling, Spinal mobilization, Cryotherapy, Moist heat, scar mobilization, Taping, Biofeedback, and Manual therapy  PLAN FOR NEXT SESSION: core/hip strengthening with cues for pelvic floor contraction if able, internal manual release as needed, next DN include Rt lateral hip, revisit Rt adductors and multifidi  Baruch Merl, PT 09/15/22 8:46 AM    [facility] [address] [place], [place] [address] Phone # [phone] Fax [phone]

## 2022-09-18 ENCOUNTER — Inpatient Hospital Stay: Payer: BC Managed Care – PPO | Attending: Hematology and Oncology

## 2022-09-18 ENCOUNTER — Ambulatory Visit (HOSPITAL_COMMUNITY)
Admission: RE | Admit: 2022-09-18 | Discharge: 2022-09-18 | Disposition: A | Discharge status: Home / Self Care | Payer: BC Managed Care – PPO | Source: Ambulatory Visit | Attending: Gynecologic Oncology | Admitting: Gynecologic Oncology

## 2022-09-18 ENCOUNTER — Other Ambulatory Visit: Payer: Self-pay

## 2022-09-18 DIAGNOSIS — Z170 Estrogen receptor positive status [ER+]: Secondary | ICD-10-CM | POA: Insufficient documentation

## 2022-09-18 DIAGNOSIS — D259 Leiomyoma of uterus, unspecified: Secondary | ICD-10-CM | POA: Insufficient documentation

## 2022-09-18 DIAGNOSIS — Z853 Personal history of malignant neoplasm of breast: Secondary | ICD-10-CM | POA: Insufficient documentation

## 2022-09-18 DIAGNOSIS — C50211 Malignant neoplasm of upper-inner quadrant of right female breast: Secondary | ICD-10-CM | POA: Insufficient documentation

## 2022-09-18 DIAGNOSIS — Z1502 Genetic susceptibility to malignant neoplasm of ovary: Secondary | ICD-10-CM | POA: Diagnosis not present

## 2022-09-18 DIAGNOSIS — D529 Folate deficiency anemia, unspecified: Secondary | ICD-10-CM | POA: Diagnosis present

## 2022-09-18 DIAGNOSIS — Z8041 Family history of malignant neoplasm of ovary: Secondary | ICD-10-CM | POA: Diagnosis not present

## 2022-09-19 LAB — CA 125: Cancer Antigen (CA) 125: 15.1 U/mL (ref 0.0–38.1)

## 2022-09-23 ENCOUNTER — Encounter: Payer: Self-pay | Admitting: Rehabilitative and Restorative Service Providers"

## 2022-09-23 ENCOUNTER — Ambulatory Visit: Payer: BC Managed Care – PPO | Admitting: Rehabilitative and Restorative Service Providers"

## 2022-09-23 DIAGNOSIS — M6281 Muscle weakness (generalized): Secondary | ICD-10-CM | POA: Diagnosis not present

## 2022-09-23 DIAGNOSIS — M62838 Other muscle spasm: Secondary | ICD-10-CM

## 2022-09-23 DIAGNOSIS — R279 Unspecified lack of coordination: Secondary | ICD-10-CM

## 2022-09-23 NOTE — Therapy (Signed)
OUTPATIENT PHYSICAL THERAPY FEMALE PELVIC TREATMENT   Patient Name: Hannah Woodard MRN: [id number] DOB:1971/04/22, 51 y.o., female Today's Date: 09/23/2022   PT End of Session - 09/23/22 0745     Visit Number 18    Date for PT Re-Evaluation 10/27/22    Authorization Type BCBS    PT Start Time 0738    PT Stop Time 0801    PT Time Calculation (min) 23 min    Activity Tolerance Patient tolerated treatment well    Behavior During Therapy [facility] for tasks assessed/performed                Past Medical History:  Diagnosis Date   Breast cancer ([place])    Family history of ovarian cancer 09/02/2017   Family history of prostate cancer in father 09/02/2017   Family history of uterine cancer 09/02/2017   Plantar fasciitis    Past Surgical History:  Procedure Laterality Date   PORT-A-CATH REMOVAL Left 2020   right lumpectomy, sentinel node biopsy, and bilateral mammoplasty  10/2017   [facility]   Patient Active Problem List   Diagnosis Date Noted   Overweight (BMI 25.0-29.9) 12/31/2020   Hyperlipidemia 12/30/2020   Neuropathy due to chemotherapeutic drug ([place]) 12/31/2017   Port-A-Cath in place 11/12/2017   Genetic testing 09/15/2017   Malignant neoplasm of upper-inner quadrant of right breast in female, estrogen receptor positive ([place]) 08/27/2017   Vitamin D deficiency 12/09/2015   Medication management 12/09/2015   Dense breast tissue 12/09/2015    PCP: Hayden Rasmussen, MD  REFERRING PROVIDER: Dorothyann Gibbs, NP  REFERRING DIAG:  R35.0 (ICD-10-CM) - Urinary frequency N94.10 (ICD-10-CM) - Dyspareunia in female T52.6X5A (ICD-10-CM) - Adverse effect of antigonadotrophins, antiestrogens, antiandrogens, not elsewhere classified, initial encounter  THERAPY DIAG:  Muscle weakness (generalized)  Unspecified lack of coordination  Other muscle spasm  Rationale for Evaluation and Treatment Rehabilitation  ONSET DATE: years  SUBJECTIVE:                                                                                                                                                                                            SUBJECTIVE STATEMENT: Patient reports getting a home e-stim unit.  States some soreness overall, but improving.  Fluid intake: Yes: 60 oz water, tea sometimes but not regular       PAIN:  Are you having pain? No - but with intercourse  NPRS scale: 3-4/10 Rt>Lt inner thigh Pain location:  intercourse, internally vaginally.  Pain reported in feet and thighs   PRECAUTIONS: None  WEIGHT BEARING RESTRICTIONS No  FALLS:  Has patient fallen in last 6  months? 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  PLOF: Independent  PATIENT GOALS to have less pain, more regular voids  PERTINENT HISTORY:  personal history of triple positive breast cancer at age 50 and family history of ovarian cancer, BRCA negative on genetic testing Vaginal atrophy Malignant neoplasm of upper-inner quadrant of right breast in female, estrogen receptor positive   Sexual abuse: No  BOWEL MOVEMENT Pain with bowel movement: No Type of bowel movement:Type (Bristol Stool Scale) 2-3, Frequency daily, and Strain Yes but this has improved since starting using step stool in bathroom Fully empty rectum: No Leakage: No Pads: No Fiber supplement: Yes:    URINATION Pain with urination: No sometimes, rarely will have a burning Fully empty bladder: No Stream: Strong and Weak Urgency: Yes: consistently Frequency: 2 hours sometimes sooner, usually gets up at night 1x Leakage: Coughing and Sneezing Pads: No  INTERCOURSE Pain with intercourse: Initial Penetration, During Penetration, After Intercourse, and Pain Interrupts Intercourse Ability to have vaginal penetration:  Yes:   Climax: not painful but pain prevents climax sometimes Marinoff Scale: 2/3  PREGNANCY Vaginal  deliveries 0 Tearing No C-section deliveries 0 Currently pregnant No  PROLAPSE None    OBJECTIVE:   DIAGNOSTIC FINDINGS:   COGNITION:  Overall cognitive status: Within functional limits for tasks assessed     SENSATION:  Light touch: Appears intact   Proprioception: Appears intact  MUSCLE LENGTH: Bil hamstring and adductor limited by 25%                POSTURE: rounded shoulders and forward head    LUMBARAROM/PROM  A/PROM A/PROM  eval  Flexion Limited by 25%  Extension WFL  Right lateral flexion Limited by 25%  Left lateral flexion Limited by 25%  Right rotation WFL  Left rotation WFL   (Blank rows = not tested)  LOWER EXTREMITY ROM:  Bil WFL LOWER EXTREMITY MMT:   Bil hips 4/5 grossly, knees and ankles 5/5   PALPATION:   General  no TTP, mild fascial restrictions noted in lower abdominal quadrants and over bladder                External Perineal Exam no TTP, dryness noted externally                             Internal Pelvic Floor no TTP however increased tension noted throughout  Patient confirms identification and approves PT to assess internal pelvic floor and treatment Yes  PELVIC MMT:   MMT eval 06/03/22  07/06/22  07/20/22   Vaginal 1/5 with max cues and quick release; without cues 0/5; 1s; 1 rep 1/5 initially but with quick release 2/5.   2/5 with minimal cues 2/5 - however with palpation of superficial muscles pt reported pain with palpation at bulbocavernosus bil and had several small trigger points.   Internal Anal Sphincter      External Anal Sphincter      Puborectalis      Diastasis Recti      (Blank rows = not tested)        TONE: Mildly increased  PROLAPSE: Not seen in hooklying   TODAY'S TREATMENT   09/23/2022: Trigger Point Dry-Needling  Treatment instructions: Expect mild to moderate muscle soreness. S/S of pneumothorax if dry needled over a lung field, and to seek immediate medical attention should they occur. Patient  verbalized understanding of these instructions and education. Patient Consent Given: Yes Education handout provided: Yes Muscles treated: Bilat lumbar multifidi, Bilat glute/piriformis, left hamstring, right hamstring Electrical stimulation performed: No Parameters: N/A Treatment response/outcome: Utilized skilled palpation to identify trigger points.  Able to palpate muscle twitch and muscle elongation following. Manual Therapy:  Soft tissue mobilization to left hamstring following dry needling to promote further muscle elongation    09/15/22: Trigger Point Dry-Needling  Treatment instructions: Expect mild to moderate muscle soreness. S/S of pneumothorax if dry needled over a lung field, and to seek immediate medical attention should they occur. Patient verbalized understanding of these instructions and education.  Patient Consent Given: Yes Education handout provided: Previously provided Muscles treated: bil lumbar multifidi, bil adductors Electrical stimulation performed: No Parameters: N/A Treatment response/outcome: deep ache and twitch Rt > Lt lumbar, and bil adductors radiating to pubic ramus and medial knee STM and passive stretching to adductors and Rt lumbar multifidi after DN  09/03/22: Pt requests to do stretching for hips today as she has done strengthening this morning.  Butterfly stretch bil 3x30s Hip IR stretch 3x30s bil Hamstring stretch 3x30s each ITB stretch 3x30s each     PATIENT EDUCATION:  Education details: [id number], urge drill  Person educated: Patient Education method: Explanation, Demonstration, Tactile cues, Verbal cues, and Handouts Education comprehension: verbalized understanding and returned demonstration   HOME EXERCISE PROGRAM: [id number]  ASSESSMENT:  CLINICAL IMPRESSION: Hannah Woodard continues to present with increased pain.  States that she does believe that dry needling is helping.  Also states that she just received an e-stim unit for home use  that she is hoping will assist with her pain.  Patient continues to report muscle looseness following dry needling and manual therapy.  Patient to have pelvic PT session planned for next visit.   OBJECTIVE IMPAIRMENTS decreased coordination, decreased endurance, decreased mobility, decreased strength, increased fascial restrictions, increased muscle spasms, impaired flexibility, improper body mechanics, postural dysfunction, and pain.   ACTIVITY LIMITATIONS continence, locomotion level, and intercourse  PARTICIPATION LIMITATIONS: interpersonal relationship and community activity  PERSONAL FACTORS Past/current experiences, Time since onset of injury/illness/exacerbation, and 1 comorbidity: medical history  are also affecting patient's functional outcome.   REHAB POTENTIAL: Good  CLINICAL DECISION MAKING: Stable/uncomplicated  EVALUATION COMPLEXITY: Low   GOALS: Goals reviewed with patient? Yes  SHORT TERM GOALS: Target date: 05/26/2022  Pt to be I with HEP.  Baseline: Goal status: MET  2.  Pt to be I with use of feminine moisturizers and lubricants as needed for improved vaginal health and decreased pain with penetration.  Baseline:  Goal status: MET  3.  Pt to demonstrated improved coordination with pelvic floor and breathing mechanics with body weight squats without leakage or pain for improved pelvic stability.  Baseline:  Goal status: MET   LONG TERM GOALS: Target date:  07/29/22  >10/27/21 extension   Pt to be I with advanced HEP.  Baseline:  Goal status: MET  2.  Pt to demonstrate at least 5/5 bil hip strength for improved pelvic stability and functional squats without leakage.  Baseline:  Goal status: MET  3. Pt to demonstrate at least 3/5 pelvic floor strength for improved pelvic stability and decreased strain at pelvic floor/ decrease leakage.  Baseline:  Goal status: on going - focus on recent sessions has been decreasing internal pelvic pain with intercourse  however has shown progress toward this goal but last finding 2/5.   4.  Pt to report improved time between bladder  voids to at least 2.5 hours for improved QOL with decreased urinary frequency.   Baseline:  Goal status: met  5.  Pt to report no more than 2/10 pain with vaginal penetration for improved QOL and decreased pain with medical procedures. Baseline:  Goal status: on going - has been improving but not consistently 2 or less/10. At worse 5/10 without extra time for penetration but with extra time and lubrication 3-4/10.     PLAN: PT FREQUENCY: 1x/week  PT DURATION:  6 sessions 07/27/22 additional from recert  PLANNED INTERVENTIONS: Therapeutic exercises, Therapeutic activity, Neuromuscular re-education, Patient/Family education, Self Care, Joint mobilization, Aquatic Therapy, Dry Needling, Spinal mobilization, Cryotherapy, Moist heat, scar mobilization, Taping, Biofeedback, and Manual therapy  PLAN FOR NEXT SESSION: core/hip strengthening with cues for pelvic floor contraction if able, internal manual release as needed, Dry Needling/Manual therapy as needed.    Juel Burrow, PT, DPT 09/23/22 8:28 AM    [facility] [address] [place], [place] [address] Phone # [phone] Fax [phone]

## 2022-09-25 ENCOUNTER — Other Ambulatory Visit: Payer: Self-pay

## 2022-09-25 ENCOUNTER — Ambulatory Visit: Payer: BC Managed Care – PPO | Attending: Physical Medicine and Rehabilitation

## 2022-09-25 DIAGNOSIS — R262 Difficulty in walking, not elsewhere classified: Secondary | ICD-10-CM | POA: Insufficient documentation

## 2022-09-25 DIAGNOSIS — M25572 Pain in left ankle and joints of left foot: Secondary | ICD-10-CM | POA: Insufficient documentation

## 2022-09-25 DIAGNOSIS — T451X5A Adverse effect of antineoplastic and immunosuppressive drugs, initial encounter: Secondary | ICD-10-CM | POA: Insufficient documentation

## 2022-09-25 DIAGNOSIS — G620 Drug-induced polyneuropathy: Secondary | ICD-10-CM | POA: Diagnosis present

## 2022-09-25 DIAGNOSIS — M6281 Muscle weakness (generalized): Secondary | ICD-10-CM | POA: Insufficient documentation

## 2022-09-25 NOTE — Therapy (Signed)
OUTPATIENT PHYSICAL THERAPY LOWER EXTREMITY EVALUATION   Patient Name: Hannah Woodard MRN: [id number] DOB:April 07, 1971, 51 y.o., female Today's Date: 09/25/2022  END OF SESSION:  PT End of Session - 09/25/22 0932     Visit Number 19    Date for PT Re-Evaluation 10/27/22    Authorization Type [employer]    PT Start Time 0808    PT Stop Time 0160    PT Time Calculation (min) 36 min    Activity Tolerance Patient tolerated treatment well    Behavior During Therapy [facility] for tasks assessed/performed             Past Medical History:  Diagnosis Date   Breast cancer ([place])    Family history of ovarian cancer 09/02/2017   Family history of prostate cancer in father 09/02/2017   Family history of uterine cancer 09/02/2017   Plantar fasciitis    Past Surgical History:  Procedure Laterality Date   PORT-A-CATH REMOVAL Left 2020   right lumpectomy, sentinel node biopsy, and bilateral mammoplasty  10/2017   [facility]   Patient Active Problem List   Diagnosis Date Noted   Overweight (BMI 25.0-29.9) 12/31/2020   Hyperlipidemia 12/30/2020   Neuropathy due to chemotherapeutic drug ([place]) 12/31/2017   Port-A-Cath in place 11/12/2017   Genetic testing 09/15/2017   Malignant neoplasm of upper-inner quadrant of right breast in female, estrogen receptor positive ([place]) 08/27/2017   Vitamin D deficiency 12/09/2015   Medication management 12/09/2015   Dense breast tissue 12/09/2015    PCP: Horald Pollen  REFERRING PROVIDER: Leeroy Cha  REFERRING DIAG: Neuropathy due to chemotherapy  THERAPY DIAG:  Pain in left ankle and joints of left foot  Peripheral neuropathy due to chemotherapy ([place])  Difficulty in walking, not elsewhere classified  Muscle weakness (generalized)  Rationale for Evaluation and Treatment: Rehabilitation  ONSET DATE: Patient reports neuropathy for several years since breast cancer diagnosis in 2018  SUBJECTIVE:   SUBJECTIVE STATEMENT: Patient states that she  has had neuropathy since 2018 when she started chemotherapy for breast cancer. Patient states that she has high arches and insoles for her shoes. She reports that walking or standing barefoot for long periods of time will cause increased pain her foot. Pain is worst in the mornings and walking longer than 20 minutes.   PERTINENT HISTORY: personal history of triple positive breast cancer at age 32 and family history of ovarian cancer, BRCA negative on genetic testing Vaginal atrophy Malignant neoplasm of upper-inner quadrant of right breast in female, estrogen receptor positive  PAIN:  Are you having pain? Yes: NPRS scale: 4/10 Pain location: bilateral feet Pain description: tingling after sitting for extended periods, throbbing, sharp/stabbing pains Aggravating factors: sitting, walking, standing/cooking more than 20 minutes, squatting  Relieving factors: topical CBD, ice socks, topical ointments  PRECAUTIONS: None  WEIGHT BEARING RESTRICTIONS: No  FALLS:  Has patient fallen in last 6 months? No  LIVING ENVIRONMENT: Lives with: lives with their spouse Lives in: House/apartment Stairs: Yes: External: 5 steps; on left going up Has following equipment at home: None  OCCUPATION: Consulting civil engineer  PLOF: Independent  PATIENT GOALS: walk without pain  NEXT MD VISIT:   OBJECTIVE:   DIAGNOSTIC FINDINGS: TTP bilateral feet and plantar fascia. Decreased joint mobility in MTPs and talocrural joint. Decreased ankle mobility limiting ability to squat, bend, lift, and with decreased activity tolerance.   PATIENT SURVEYS:  LEFS administer at next session  COGNITION: Overall cognitive status: Within functional limits for tasks assessed  SENSATION: Light touch: decreased touch on left LE vs left   POSTURE: decreased lumbar lordosis and posterior pelvic tilt  PALPATION: Significant TTP bilateral plantar fascia, gastroc, anterior tib, posterior tib, foot intrinsics  LOWER  EXTREMITY ROM:  Active ROM Right eval Left eval  Hip flexion    Hip extension    Hip abduction    Hip adduction    Hip internal rotation    Hip external rotation    Knee flexion    Knee extension    Ankle dorsiflexion -4 6  Ankle plantarflexion 47 51  Ankle inversion 40 42  Ankle eversion 11 17   (Blank rows = not tested)  LOWER EXTREMITY MMT:  MMT Right eval Left eval  Hip flexion    Hip extension    Hip abduction    Hip adduction    Hip internal rotation    Hip external rotation    Knee flexion    Knee extension    Ankle dorsiflexion    Ankle plantarflexion    Ankle inversion    Ankle eversion     (Blank rows = not tested)  LOWER EXTREMITY SPECIAL TESTS:  Ankle special tests: Tibial torsion test: negative, Talar tilt test: negative, and Thompson's test: negative  FUNCTIONAL TESTS:  Timed up and go (TUG): 10.98  GAIT: Distance walked: 25 Assistive device utilized: None Level of assistance: Complete Independence Comments: scissoring and decreased stride length bilaterally   TODAY'S TREATMENT:                                                                                                                              DATE: 09/25/2022    PATIENT EDUCATION:  Education details: Discussed objective findings. Discussed POC and HEP Person educated: Patient Education method: Explanation, Demonstration, and Handouts Education comprehension: verbalized understanding, returned demonstration, and needs further education  HOME EXERCISE PROGRAM: Access Code: [email or web address] URL: [email or web address] Date: 09/25/2022 Prepared by: Rochel Brome Diron Haddon  Exercises - Towel Scrunches  - 2 x daily - 7 x weekly - 3 sets - 1 minute hold - Ankle Inversion Eversion Towel Slide  - 2 x daily - 7 x weekly - 3 sets - 1 minute hold - Toe Yoga - Alternating Great Toe and Lesser Toe Extension  - 2 x daily - 7 x weekly - 3 sets - 10 reps - Seated Plantar Fascia  Mobilization with Small Ball  - 2 x daily - 7 x weekly - 3 sets - 1 minute hold  ASSESSMENT:  CLINICAL IMPRESSION: Patient is a 51 y.o. female who was seen today for physical therapy evaluation and treatment for neuropathy of bilateral feet due to chemotherapy. Patient presents with decreased ankle and MTP joint mobility. Patient with decreased gait and standing tolerance and ambulates with frequent scissoring and decreased stride length with poor heel strike during initial contact. Patient requires skilled therapy services to address and return to prior level of function.   OBJECTIVE IMPAIRMENTS:  Abnormal gait, decreased balance, difficulty walking, decreased ROM, decreased strength, and pain.   ACTIVITY LIMITATIONS: carrying, lifting, bending, standing, squatting, and caring for others  PARTICIPATION LIMITATIONS: community activity and occupation  PERSONAL FACTORS: 1-2 comorbidities: history of cancer and chronicity of symptoms  are also affecting patient's functional outcome.   REHAB POTENTIAL: Fair due to chemo and chronicity of symptoms/neuropathy  CLINICAL DECISION MAKING: Stable/uncomplicated  EVALUATION COMPLEXITY: Low   GOALS: Goals reviewed with patient? No  SHORT TERM GOALS: Target date: 10/23/2022   Patient will be independent and compliant with HEP to improve carryover of sessions Baseline: HEP provided. See above Goal status: INITIAL  2.  Patient will be able to demonstrate DF of right ankle to neutral in order to improve gait and mobility Baseline: lacking 4 degrees of neutral on right  Goal status: INITIAL  3.  Patient will be able to walk greater than 30 minutes without pain in order to improve work tolerance Baseline: Unable to walk greater than 20 minutes without pain at this time Goal status: INITIAL  4.  Patient will be able to squat and lift 15lbs from floor without increase in pain to perform housechores and take care of animals Baseline: squats with  significantly rounded lumbar spine and is unable to lift greater than 10lbs without difficulty Goal status: INITIAL    LONG TERM GOALS: Target date: 11/20/2022    Patient will be able ambulate greater than 1 hour to improve tolerance to work demands and ease with house chores Baseline: unable to stand/walk greater than 20 minutes  Goal status: INITIAL  2.  Patient will be able to demonstrate proper lifting mechanics to lift and squat greater than 30lbs from floor for work tasks and house chores Baseline:  Goal status: INITIAL   PLAN:  PT FREQUENCY: 1x/week  PT DURATION: 8 weeks  PLANNED INTERVENTIONS: Therapeutic exercises, Therapeutic activity, Neuromuscular re-education, Balance training, Gait training, Patient/Family education, Self Care, Joint mobilization, Joint manipulation, Stair training, Dry Needling, Electrical stimulation, Cryotherapy, Moist heat, Taping, Vasopneumatic device, Traction, Manual therapy, and Re-evaluation  PLAN FOR NEXT SESSION: Continue PT services   Awilda Bill Jaymie Mckiddy, PT 09/25/2022, 9:33 AM

## 2022-10-01 ENCOUNTER — Ambulatory Visit: Payer: BC Managed Care – PPO | Attending: Physical Medicine and Rehabilitation | Admitting: Physical Therapy

## 2022-10-01 ENCOUNTER — Encounter: Payer: Self-pay | Admitting: Physical Therapy

## 2022-10-01 DIAGNOSIS — G620 Drug-induced polyneuropathy: Secondary | ICD-10-CM | POA: Diagnosis present

## 2022-10-01 DIAGNOSIS — M25572 Pain in left ankle and joints of left foot: Secondary | ICD-10-CM | POA: Insufficient documentation

## 2022-10-01 DIAGNOSIS — M62838 Other muscle spasm: Secondary | ICD-10-CM | POA: Diagnosis present

## 2022-10-01 DIAGNOSIS — R293 Abnormal posture: Secondary | ICD-10-CM | POA: Diagnosis present

## 2022-10-01 DIAGNOSIS — R262 Difficulty in walking, not elsewhere classified: Secondary | ICD-10-CM | POA: Insufficient documentation

## 2022-10-01 DIAGNOSIS — M6281 Muscle weakness (generalized): Secondary | ICD-10-CM | POA: Insufficient documentation

## 2022-10-01 DIAGNOSIS — R279 Unspecified lack of coordination: Secondary | ICD-10-CM | POA: Insufficient documentation

## 2022-10-01 DIAGNOSIS — T451X5A Adverse effect of antineoplastic and immunosuppressive drugs, initial encounter: Secondary | ICD-10-CM | POA: Insufficient documentation

## 2022-10-01 NOTE — Therapy (Signed)
OUTPATIENT PHYSICAL THERAPY LOWER EXTREMITY EVALUATION  Progress Note Reporting Period 07/14/23 to 10/01/22  See note below for Objective Data and Assessment of Progress/Goals.     Patient Name: Hannah Woodard MRN: [id number] DOB:05-19-1971, 52 y.o., female Today's Date: 10/01/2022  END OF SESSION:  PT End of Session - 10/01/22 0855     Visit Number 20    Date for PT Re-Evaluation 10/27/22    PT Start Time 0848    PT Stop Time 0926    PT Time Calculation (min) 38 min    Activity Tolerance Patient tolerated treatment well    Behavior During Therapy [facility] for tasks assessed/performed              Past Medical History:  Diagnosis Date   Breast cancer ([place])    Family history of ovarian cancer 09/02/2017   Family history of prostate cancer in father 09/02/2017   Family history of uterine cancer 09/02/2017   Plantar fasciitis    Past Surgical History:  Procedure Laterality Date   PORT-A-CATH REMOVAL Left 2020   right lumpectomy, sentinel node biopsy, and bilateral mammoplasty  10/2017   [facility]   Patient Active Problem List   Diagnosis Date Noted   Overweight (BMI 25.0-29.9) 12/31/2020   Hyperlipidemia 12/30/2020   Neuropathy due to chemotherapeutic drug ([place]) 12/31/2017   Port-A-Cath in place 11/12/2017   Genetic testing 09/15/2017   Malignant neoplasm of upper-inner quadrant of right breast in female, estrogen receptor positive ([place]) 08/27/2017   Vitamin D deficiency 12/09/2015   Medication management 12/09/2015   Dense breast tissue 12/09/2015    PCP: Horald Pollen  REFERRING PROVIDER: Leeroy Cha  REFERRING DIAG: Neuropathy due to chemotherapy  THERAPY DIAG:  Pain in left ankle and joints of left foot  Peripheral neuropathy due to chemotherapy ([place])  Difficulty in walking, not elsewhere classified  Muscle weakness (generalized)  Unspecified lack of coordination  Other muscle spasm  Abnormal posture  Rationale for Evaluation and Treatment:  Rehabilitation  ONSET DATE: Patient reports neuropathy for several years since breast cancer diagnosis in 2018  SUBJECTIVE:   SUBJECTIVE STATEMENT: Patient states that she has not been able to perform her HEP, needs a copy. Her R knee and lateral R foot are hurting. R foot is more painful than L, but L heel is also painful. 4/10. She has created her own inserts, combining 2 orthotics in order to provide adequate arch support.  PERTINENT HISTORY: personal history of triple positive breast cancer at age 49 and family history of ovarian cancer, BRCA negative on genetic testing Vaginal atrophy Malignant neoplasm of upper-inner quadrant of right breast in female, estrogen receptor positive  PAIN:  Are you having pain? Yes: NPRS scale: 4/10 Pain location: bilateral feet Pain description: tingling after sitting for extended periods, throbbing, sharp/stabbing pains Aggravating factors: sitting, walking, standing/cooking more than 20 minutes, squatting  Relieving factors: topical CBD, ice socks, topical ointments  PRECAUTIONS: None  WEIGHT BEARING RESTRICTIONS: No  FALLS:  Has patient fallen in last 6 months? No  LIVING ENVIRONMENT: Lives with: lives with their spouse Lives in: House/apartment Stairs: Yes: External: 5 steps; on left going up Has following equipment at home: None  OCCUPATION: Consulting civil engineer  PLOF: Independent  PATIENT GOALS: walk without pain  NEXT MD VISIT:   OBJECTIVE:   DIAGNOSTIC FINDINGS: TTP bilateral feet and plantar fascia. Decreased joint mobility in MTPs and talocrural joint. Decreased ankle mobility limiting ability to squat, bend, lift, and with decreased activity tolerance.  PATIENT SURVEYS:  LEFS administer at next session  COGNITION: Overall cognitive status: Within functional limits for tasks assessed     SENSATION: Light touch: decreased touch on left LE vs left   POSTURE: decreased lumbar lordosis and posterior pelvic  tilt  PALPATION: Significant TTP bilateral plantar fascia, gastroc, anterior tib, posterior tib, foot intrinsics  LOWER EXTREMITY ROM:  Active ROM Right eval Left eval  Hip flexion    Hip extension    Hip abduction    Hip adduction    Hip internal rotation    Hip external rotation    Knee flexion    Knee extension    Ankle dorsiflexion -4 6  Ankle plantarflexion 47 51  Ankle inversion 40 42  Ankle eversion 11 17   (Blank rows = not tested)  LOWER EXTREMITY MMT:  MMT Right eval Left eval  Hip flexion    Hip extension    Hip abduction    Hip adduction    Hip internal rotation    Hip external rotation    Knee flexion    Knee extension    Ankle dorsiflexion    Ankle plantarflexion    Ankle inversion    Ankle eversion     (Blank rows = not tested)  LOWER EXTREMITY SPECIAL TESTS:  Ankle special tests: Tibial torsion test: negative, Talar tilt test: negative, and Thompson's test: negative  FUNCTIONAL TESTS:  Timed up and go (TUG): 10.98  GAIT: Distance walked: 25 Assistive device utilized: None Level of assistance: Complete Independence Comments: scissoring and decreased stride length bilaterally   TODAY'S TREATMENT:                                                                                                                              DATE:  10/01/22 Bike L3 x 6 minutes Supine clamshells against G tband x 10 Bridge with hip abd against G tband x 10 STM with tennis ball, B calves in sit Stanidng wall stretch for gastroc and soleus B  09/25/2022    PATIENT EDUCATION:  Education details: Discussed objective findings. Discussed POC and HEP Person educated: Patient Education method: Explanation, Demonstration, and Handouts Education comprehension: verbalized understanding, returned demonstration, and needs further education  HOME EXERCISE PROGRAM: Access Code: [email or web address] URL: [email or web address] Date: 09/25/2022 Prepared by: Rochel Brome Diy   ASSESSMENT:  CLINICAL IMPRESSION: Patient reports R knee and foot pain. HEP updated to include additional STM and stretch as well as hip strengthening to provide improved alignment over her feet during gait ans stand. Recommend custom orthotics as her current orthotics do not appear to provide adequate support. Status re-assessed, but this is only her second visit for her ankle pain. She has initiated additional Hep exercises to address.  OBJECTIVE IMPAIRMENTS: Abnormal gait, decreased balance, difficulty walking, decreased ROM, decreased strength, and pain.   ACTIVITY LIMITATIONS: carrying, lifting, bending, standing, squatting, and caring for others  PARTICIPATION LIMITATIONS: community activity and occupation  PERSONAL  FACTORS: 1-2 comorbidities: history of cancer and chronicity of symptoms  are also affecting patient's functional outcome.   REHAB POTENTIAL: Fair due to chemo and chronicity of symptoms/neuropathy  CLINICAL DECISION MAKING: Stable/uncomplicated  EVALUATION COMPLEXITY: Low   GOALS: Goals reviewed with patient? No  SHORT TERM GOALS: Target date: 10/23/2022   Patient will be independent and compliant with HEP to improve carryover of sessions Baseline: HEP provided. See above Goal status: met  2.  Patient will be able to demonstrate DF of right ankle to neutral in order to improve gait and mobility Baseline: lacking 4 degrees of neutral on right  Goal status: INITIAL  3.  Patient will be able to walk greater than 30 minutes without pain in order to improve work tolerance Baseline: Unable to walk greater than 20 minutes without pain at this time Goal status: INITIAL  4.  Patient will be able to squat and lift 15lbs from floor without increase in pain to perform housechores and take care of animals Baseline: squats with significantly rounded lumbar spine and is unable to lift greater than 10lbs without difficulty Goal status: INITIAL    LONG  TERM GOALS: Target date: 11/20/2022    Patient will be able ambulate greater than 1 hour to improve tolerance to work demands and ease with house chores Baseline: unable to stand/walk greater than 20 minutes  Goal status: INITIAL  2.  Patient will be able to demonstrate proper lifting mechanics to lift and squat greater than 30lbs from floor for work tasks and house chores Baseline:  Goal status: INITIAL   PLAN:  PT FREQUENCY: 1x/week  PT DURATION: 8 weeks  PLANNED INTERVENTIONS: Therapeutic exercises, Therapeutic activity, Neuromuscular re-education, Balance training, Gait training, Patient/Family education, Self Care, Joint mobilization, Joint manipulation, Stair training, Dry Needling, Electrical stimulation, Cryotherapy, Moist heat, Taping, Vasopneumatic device, Traction, Manual therapy, and Re-evaluation  PLAN FOR NEXT SESSION: Continue PT services   Marcelina Morel, DPT 10/01/2022, 9:31 AM

## 2022-10-02 ENCOUNTER — Encounter: Payer: Self-pay | Admitting: Physical Medicine and Rehabilitation

## 2022-10-02 ENCOUNTER — Ambulatory Visit: Payer: BC Managed Care – PPO | Admitting: Physical Therapy

## 2022-10-02 DIAGNOSIS — M62838 Other muscle spasm: Secondary | ICD-10-CM

## 2022-10-02 DIAGNOSIS — M6281 Muscle weakness (generalized): Secondary | ICD-10-CM

## 2022-10-02 DIAGNOSIS — R279 Unspecified lack of coordination: Secondary | ICD-10-CM

## 2022-10-02 DIAGNOSIS — M25572 Pain in left ankle and joints of left foot: Secondary | ICD-10-CM | POA: Diagnosis not present

## 2022-10-02 NOTE — Therapy (Signed)
OUTPATIENT PHYSICAL THERAPY FEMALE PELVIC TREATMENT   Patient Name: Hannah Woodard MRN: [id number] DOB:07-Oct-1970, 52 y.o., female Today's Date: 10/02/2022   PT End of Session - 10/02/22 0808     Visit Number 21    Date for PT Re-Evaluation 11/27/22    Authorization Type BC[employer]    PT Start Time 0806    PT Stop Time 0845    PT Time Calculation (min) 39 min    Activity Tolerance Patient tolerated treatment well    Behavior During Therapy [facility] for tasks assessed/performed                Past Medical History:  Diagnosis Date   Breast cancer ([place])    Family history of ovarian cancer 09/02/2017   Family history of prostate cancer in father 09/02/2017   Family history of uterine cancer 09/02/2017   Plantar fasciitis    Past Surgical History:  Procedure Laterality Date   PORT-A-CATH REMOVAL Left 2020   right lumpectomy, sentinel node biopsy, and bilateral mammoplasty  10/2017   [facility]   Patient Active Problem List   Diagnosis Date Noted   Overweight (BMI 25.0-29.9) 12/31/2020   Hyperlipidemia 12/30/2020   Neuropathy due to chemotherapeutic drug ([place]) 12/31/2017   Port-A-Cath in place 11/12/2017   Genetic testing 09/15/2017   Malignant neoplasm of upper-inner quadrant of right breast in female, estrogen receptor positive ([place]) 08/27/2017   Vitamin D deficiency 12/09/2015   Medication management 12/09/2015   Dense breast tissue 12/09/2015    PCP: Hayden Rasmussen, MD  REFERRING PROVIDER: Dorothyann Gibbs, NP  REFERRING DIAG:  R35.0 (ICD-10-CM) - Urinary frequency N94.10 (ICD-10-CM) - Dyspareunia in female T100.6X5A (ICD-10-CM) - Adverse effect of antigonadotrophins, antiestrogens, antiandrogens, not elsewhere classified, initial encounter  THERAPY DIAG:  Muscle weakness (generalized)  Unspecified lack of coordination  Other muscle spasm  Rationale for Evaluation and Treatment Rehabilitation  ONSET DATE: years  SUBJECTIVE:                                                                                                                                                                                            SUBJECTIVE STATEMENT: Patient states she has been working on dilators at home and now using next size up and does have a little pain with initial penetration but with relaxation exercises and mobility of dilator pain lowers. Pt also started PT for bil foot pain.   Fluid intake: Yes: 60 oz water, tea sometimes but not regular       PAIN:  Are you having pain? No  NPRS scale: 3-4/10 Rt>Lt inner thigh Pain location:  intercourse, internally  vaginally.  Pain reported in feet and thighs   PRECAUTIONS: None  WEIGHT BEARING RESTRICTIONS No  FALLS:  Has patient fallen in last 6 months? 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  PLOF: Independent  PATIENT GOALS to have less pain, more regular voids  PERTINENT HISTORY:  personal history of triple positive breast cancer at age 31 and family history of ovarian cancer, BRCA negative on genetic testing Vaginal atrophy Malignant neoplasm of upper-inner quadrant of right breast in female, estrogen receptor positive   Sexual abuse: No  BOWEL MOVEMENT Pain with bowel movement: No Type of bowel movement:Type (Bristol Stool Scale) 2-3, Frequency daily, and Strain Yes but this has improved since starting using step stool in bathroom Fully empty rectum: No Leakage: No Pads: No Fiber supplement: Yes:    URINATION Pain with urination: No sometimes, rarely will have a burning Fully empty bladder: No Stream: Strong and Weak Urgency: Yes: consistently Frequency: 2 hours sometimes sooner, usually gets up at night 1x Leakage: Coughing and Sneezing Pads: No  INTERCOURSE Pain with intercourse: Initial Penetration, During Penetration, After Intercourse, and Pain Interrupts Intercourse Ability to have  vaginal penetration:  Yes:   Climax: not painful but pain prevents climax sometimes Marinoff Scale: 2/3  PREGNANCY Vaginal deliveries 0 Tearing No C-section deliveries 0 Currently pregnant No  PROLAPSE None    OBJECTIVE:   DIAGNOSTIC FINDINGS:   COGNITION:  Overall cognitive status: Within functional limits for tasks assessed     SENSATION:  Light touch: Appears intact   Proprioception: Appears intact  MUSCLE LENGTH: Bil hamstring and adductor limited by 25%                POSTURE: rounded shoulders and forward head    LUMBARAROM/PROM  A/PROM A/PROM  eval  Flexion Limited by 25%  Extension WFL  Right lateral flexion Limited by 25%  Left lateral flexion Limited by 25%  Right rotation WFL  Left rotation WFL   (Blank rows = not tested)  LOWER EXTREMITY ROM:  Bil WFL LOWER EXTREMITY MMT:   Bil hips 4/5 grossly, knees and ankles 5/5   PALPATION:   General  no TTP, mild fascial restrictions noted in lower abdominal quadrants and over bladder                External Perineal Exam no TTP, dryness noted externally                             Internal Pelvic Floor no TTP however increased tension noted throughout  Patient confirms identification and approves PT to assess internal pelvic floor and treatment Yes  PELVIC MMT:   MMT eval 06/03/22  07/06/22  07/20/22  10/02/22   Vaginal 1/5 with max cues and quick release; without cues 0/5; 1s; 1 rep 1/5 initially but with quick release 2/5.   2/5 with minimal cues 2/5 - however with palpation of superficial muscles pt reported pain with palpation at bulbocavernosus bil and had several small trigger points.  3/5  Internal Anal Sphincter       External Anal Sphincter       Puborectalis       Diastasis Recti       (Blank rows = not tested)        TONE: Mildly increased  PROLAPSE: Not seen in hooklying   TODAY'S TREATMENT  10/02/22: Pt consented to internal vaginal treatment this date and found to have  improved strength grade to 3/5. Emphasis of internal treatment however was relaxation of Rt side of pelvic floor at bulbocavernosus, pubococcygeus and obturator internus. Trigger points noted in these areas and pt reporting 5/10 pain. With extra time, relaxation techniques, diaphragmatic breathing, and gentle stretching for release pt demonstrated improved muscle relaxation, mobility, and reported 2-3/10 pain. Pt states she has been using larger dilator now but has not progressed to mobility with dilator fully yet at that size. Educated to attempt gentle stretching in side and posterior pelvic floor for ~60-90s as able without pain increasing over 3-4/10 for improved tissue mobility and tolerance to increased dilator size for intercourse. Pt agreed and denied additional questions.       PATIENT EDUCATION:  Education details: [id number], urge drill  Person educated: Patient Education method: Explanation, Demonstration, Tactile cues, Verbal cues, and Handouts Education comprehension: verbalized understanding and returned demonstration   HOME EXERCISE PROGRAM: [email or web address]  ASSESSMENT:  CLINICAL IMPRESSION: Pt would benefit from additional treatment due to continued pain with vaginal penetration. Pt has been working with dilator use and relaxation techniques and showing improvement with now able to use next size up in dilator set however size not yet comparable to partner and penetration still painful and sometimes intolerable. Pt session focused on internal vaginal pelvic floor treatment, pt tolerated well overall improvement noted with tissue mobility but does still have tension and decreased mobility at Rt side. Details above. Pt also still has tension in bil hips and back which limits mobility and ability to relax pelvic floor. Pt would benefit from additional PT to further address deficits.    OBJECTIVE IMPAIRMENTS decreased coordination, decreased endurance, decreased mobility, decreased strength,  increased fascial restrictions, increased muscle spasms, impaired flexibility, improper body mechanics, postural dysfunction, and pain.   ACTIVITY LIMITATIONS continence, locomotion level, and intercourse  PARTICIPATION LIMITATIONS: interpersonal relationship and community activity  PERSONAL FACTORS Past/current experiences, Time since onset of injury/illness/exacerbation, and 1 comorbidity: medical history  are also affecting patient's functional outcome.   REHAB POTENTIAL: Good  CLINICAL DECISION MAKING: Stable/uncomplicated  EVALUATION COMPLEXITY: Low   GOALS: Goals reviewed with patient? Yes  SHORT TERM GOALS: Target date: 05/26/2022  Pt to be I with HEP.  Baseline: Goal status: MET  2.  Pt to be I with use of feminine moisturizers and lubricants as needed for improved vaginal health and decreased pain with penetration.  Baseline:  Goal status: MET  3.  Pt to demonstrated improved coordination with pelvic floor and breathing mechanics with body weight squats without leakage or pain for improved pelvic stability.  Baseline:  Goal status: MET   LONG TERM GOALS: Target date: 11/27/22  Pt to be I with advanced HEP.  Baseline:  Goal status: MET  2.  Pt to demonstrate at least 5/5 bil hip strength for improved pelvic stability and functional squats without leakage.  Baseline:  Goal status: MET  3. Pt to demonstrate at least 3/5 pelvic floor strength for improved pelvic stability and decreased strain at pelvic floor/ decrease leakage.  Baseline:  Goal status: MET  4.  Pt to report improved time between bladder voids to at least 2.5 hours for improved QOL with decreased urinary frequency.   Baseline:  Goal status: met  5.  Pt to report no more than 2/10 pain with vaginal penetration for improved QOL and decreased pain with medical procedures. Baseline:  Goal status: on going - has been  improving but not consistently 2 or less/10. At worse 5/10 without extra time for  penetration but with extra time and lubrication 3-4/10.     PLAN: PT FREQUENCY: 1-2x/week  PT DURATION: other: 8 weeks     PLANNED INTERVENTIONS: Therapeutic exercises, Therapeutic activity, Neuromuscular re-education, Patient/Family education, Self Care, Joint mobilization, Aquatic Therapy, Dry Needling, Spinal mobilization, Cryotherapy, Moist heat, scar mobilization, Taping, Biofeedback, and Manual therapy  PLAN FOR NEXT SESSION: core/hip strengthening with cues for pelvic floor contraction if able, internal manual release as needed, Dry Needling/Manual therapy as needed.  Stacy Gardner, PT, DPT 01/05/249:31 AM     [facility] [address], [place],  [address] Phone # [phone] Fax [phone]

## 2022-10-07 ENCOUNTER — Encounter: Payer: Self-pay | Admitting: Physical Therapy

## 2022-10-07 ENCOUNTER — Ambulatory Visit: Payer: BC Managed Care – PPO | Admitting: Physical Therapy

## 2022-10-07 DIAGNOSIS — R293 Abnormal posture: Secondary | ICD-10-CM

## 2022-10-07 DIAGNOSIS — T451X5A Adverse effect of antineoplastic and immunosuppressive drugs, initial encounter: Secondary | ICD-10-CM

## 2022-10-07 DIAGNOSIS — M25572 Pain in left ankle and joints of left foot: Secondary | ICD-10-CM | POA: Diagnosis not present

## 2022-10-07 DIAGNOSIS — R262 Difficulty in walking, not elsewhere classified: Secondary | ICD-10-CM

## 2022-10-07 DIAGNOSIS — R279 Unspecified lack of coordination: Secondary | ICD-10-CM

## 2022-10-07 DIAGNOSIS — M6281 Muscle weakness (generalized): Secondary | ICD-10-CM

## 2022-10-07 NOTE — Therapy (Signed)
OUTPATIENT PHYSICAL THERAPY LOWER EXTREMITY EVALUATION  Progress Note Reporting Period 07/14/23 to 10/01/22  See note below for Objective Data and Assessment of Progress/Goals.     Patient Name: Hannah Woodard MRN: [id number] DOB:August 27, 1971, 52 y.o., female Today's Date: 10/07/2022  END OF SESSION:  PT End of Session - 10/07/22 1612     Visit Number 22    Date for PT Re-Evaluation 11/27/22    PT Start Time 1546    PT Stop Time 1625    PT Time Calculation (min) 39 min    Activity Tolerance Patient tolerated treatment well    Behavior During Therapy [facility] for tasks assessed/performed              Past Medical History:  Diagnosis Date   Breast cancer ([place])    Family history of ovarian cancer 09/02/2017   Family history of prostate cancer in father 09/02/2017   Family history of uterine cancer 09/02/2017   Plantar fasciitis    Past Surgical History:  Procedure Laterality Date   PORT-A-CATH REMOVAL Left 2020   right lumpectomy, sentinel node biopsy, and bilateral mammoplasty  10/2017   [facility]   Patient Active Problem List   Diagnosis Date Noted   Overweight (BMI 25.0-29.9) 12/31/2020   Hyperlipidemia 12/30/2020   Neuropathy due to chemotherapeutic drug ([place]) 12/31/2017   Port-A-Cath in place 11/12/2017   Genetic testing 09/15/2017   Malignant neoplasm of upper-inner quadrant of right breast in female, estrogen receptor positive ([place]) 08/27/2017   Vitamin D deficiency 12/09/2015   Medication management 12/09/2015   Dense breast tissue 12/09/2015    PCP: Horald Pollen  REFERRING PROVIDER: Leeroy Cha  REFERRING DIAG: Neuropathy due to chemotherapy  THERAPY DIAG:  Muscle weakness (generalized)  Unspecified lack of coordination  Pain in left ankle and joints of left foot  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   PERTINENT HISTORY: personal history of triple positive breast cancer at age 65 and family history of ovarian cancer, BRCA negative on genetic testing Vaginal atrophy Malignant neoplasm of upper-inner quadrant of right breast in female, estrogen receptor positive  PAIN:  Are you having pain? Yes: NPRS scale: 4/10 Pain location: bilateral feet Pain description: tingling after sitting for extended periods, throbbing, sharp/stabbing pains Aggravating factors: sitting, walking, standing/cooking more than 20 minutes, squatting  Relieving factors: topical CBD, ice socks, topical ointments  PRECAUTIONS: None  WEIGHT BEARING RESTRICTIONS: No  FALLS:  Has patient fallen in last 6 months? No  LIVING ENVIRONMENT: Lives with: lives with their spouse Lives in: House/apartment Stairs: Yes: External: 5 steps; on left going up Has following equipment at home: None  OCCUPATION: Consulting civil engineer  PLOF: Independent  PATIENT GOALS: walk without pain  NEXT MD VISIT:   OBJECTIVE:   DIAGNOSTIC FINDINGS: TTP bilateral feet and plantar fascia. Decreased joint mobility in MTPs and talocrural joint. Decreased ankle mobility limiting ability to squat, bend, lift, and with decreased activity tolerance.   PATIENT SURVEYS:  LEFS administer at next session  COGNITION: Overall cognitive status: Within functional limits for tasks assessed     SENSATION: Light touch: decreased touch on left LE vs left   POSTURE: decreased lumbar lordosis and posterior pelvic tilt  PALPATION: Significant  TTP bilateral plantar fascia, gastroc, anterior tib, posterior tib, foot intrinsics  LOWER EXTREMITY ROM:  Active ROM Right eval Left eval  Hip flexion    Hip extension    Hip abduction    Hip adduction     Hip internal rotation    Hip external rotation    Knee flexion    Knee extension    Ankle dorsiflexion -4 6  Ankle plantarflexion 47 51  Ankle inversion 40 42  Ankle eversion 11 17   (Blank rows = not tested)  LOWER EXTREMITY MMT:  MMT Right eval Left eval  Hip flexion    Hip extension    Hip abduction    Hip adduction    Hip internal rotation    Hip external rotation    Knee flexion    Knee extension    Ankle dorsiflexion    Ankle plantarflexion    Ankle inversion    Ankle eversion     (Blank rows = not tested)  LOWER EXTREMITY SPECIAL TESTS:  Ankle special tests: Tibial torsion test: negative, Talar tilt test: negative, and Thompson's test: negative  FUNCTIONAL TESTS:  Timed up and go (TUG): 10.98  GAIT: Distance walked: 25 Assistive device utilized: None Level of assistance: Complete Independence Comments: scissoring and decreased stride length bilaterally   TODAY'S TREATMENT:                                                                                                                              DATE:  10/07/22 NuStep L5 x 6 minutes Seated knee flex, 20#, 2 x 10 B LE. Seated knee ext 5#, B, 2 x 10 reps Sit to stand on Air ex, with Red Tband resistance at knees for abd, 10 reps B side stepping against Red tband resistance at knees, 4 x 5 steps each way Standing shoulder ext, rows, 10# 2 x 10 Shoulder ER against G tband, 2 x 10 reps Heel raises from the floor with UUE support, 2 x 10 reps Seated ankle eversion against yellow Tband, 2 x 10 reps Dorsalis ant stretch, sitting on feet.   10/01/22 Bike L3 x 6 minutes Supine clamshells against G tband x 10 Bridge with hip abd against G tband x 10 STM with tennis ball, B calves in sit Stanidng wall stretch for gastroc and soleus B  09/25/2022    PATIENT EDUCATION:  Education details: Discussed objective findings. Discussed POC and HEP Person educated: Patient Education method: Explanation,  Demonstration, and Handouts Education comprehension: verbalized understanding, returned demonstration, and needs further education  HOME EXERCISE PROGRAM: Access Code: [email or web address] URL: [email or web address] Date: 09/25/2022 Prepared by: Rochel Brome Diy   ASSESSMENT:  CLINICAL IMPRESSION: Patient reports that she is performing some of her HEP, it is challenging and she also has some pelvic floor stretches she needs to complete. Treatment today addressed overall strength to improve her stability. Message sent to Dr Ranell Patrick regarding Orthotics, recommend sports med physician. She tolerated  increased stress with rest breaks, required mod VC to slow down and maintain good posture.  OBJECTIVE IMPAIRMENTS: Abnormal gait, decreased balance, difficulty walking, decreased ROM, decreased strength, and pain.   ACTIVITY LIMITATIONS: carrying, lifting, bending, standing, squatting, and caring for others  PARTICIPATION LIMITATIONS: community activity and occupation  PERSONAL FACTORS: 1-2 comorbidities: history of cancer and chronicity of symptoms  are also affecting patient's functional outcome.   REHAB POTENTIAL: Fair due to chemo and chronicity of symptoms/neuropathy  CLINICAL DECISION MAKING: Stable/uncomplicated  EVALUATION COMPLEXITY: Low   GOALS: Goals reviewed with patient? No  SHORT TERM GOALS: Target date: 10/23/2022   Patient will be independent and compliant with HEP to improve carryover of sessions Baseline: HEP provided. See above Goal status: met  2.  Patient will be able to demonstrate DF of right ankle to neutral in order to improve gait and mobility Baseline: lacking 4 degrees of neutral on right  Goal status: ongoing  3.  Patient will be able to walk greater than 30 minutes without pain in order to improve work tolerance Baseline: Unable to walk greater than 20 minutes without pain at this time Goal status: INITIAL  4.  Patient will be able to squat and  lift 15lbs from floor without increase in pain to perform housechores and take care of animals Baseline: squats with significantly rounded lumbar spine and is unable to lift greater than 10lbs without difficulty Goal status: INITIAL    LONG TERM GOALS: Target date: 11/20/2022    Patient will be able ambulate greater than 1 hour to improve tolerance to work demands and ease with house chores Baseline: unable to stand/walk greater than 20 minutes  Goal status: INITIAL  2.  Patient will be able to demonstrate proper lifting mechanics to lift and squat greater than 30lbs from floor for work tasks and house chores Baseline:  Goal status: INITIAL   PLAN:  PT FREQUENCY: 1x/week  PT DURATION: 8 weeks  PLANNED INTERVENTIONS: Therapeutic exercises, Therapeutic activity, Neuromuscular re-education, Balance training, Gait training, Patient/Family education, Self Care, Joint mobilization, Joint manipulation, Stair training, Dry Needling, Electrical stimulation, Cryotherapy, Moist heat, Taping, Vasopneumatic device, Traction, Manual therapy, and Re-evaluation  PLAN FOR NEXT SESSION: Continue PT services   Ethel Rana DPT 10/07/22 4:30 PM

## 2022-10-09 ENCOUNTER — Other Ambulatory Visit: Payer: Self-pay | Admitting: Physical Medicine and Rehabilitation

## 2022-10-09 DIAGNOSIS — M21961 Unspecified acquired deformity of right lower leg: Secondary | ICD-10-CM

## 2022-10-12 ENCOUNTER — Encounter: Payer: Self-pay | Admitting: Physical Therapy

## 2022-10-12 ENCOUNTER — Ambulatory Visit: Payer: BC Managed Care – PPO | Admitting: Physical Therapy

## 2022-10-12 DIAGNOSIS — M25572 Pain in left ankle and joints of left foot: Secondary | ICD-10-CM | POA: Diagnosis not present

## 2022-10-12 DIAGNOSIS — R279 Unspecified lack of coordination: Secondary | ICD-10-CM

## 2022-10-12 DIAGNOSIS — M6281 Muscle weakness (generalized): Secondary | ICD-10-CM

## 2022-10-12 NOTE — Therapy (Signed)
OUTPATIENT PHYSICAL THERAPY FEMALE PELVIC TREATMENT   Patient Name: Hannah Woodard MRN: [id number] DOB:November 02, 1970, 52 y.o., female Today's Date: 10/12/2022   PT End of Session - 10/12/22 1104     Visit Number 23    Date for PT Re-Evaluation 11/27/22    Authorization Type BC[employer]    PT Start Time 1102    PT Stop Time 1143    PT Time Calculation (min) 41 min    Activity Tolerance Patient tolerated treatment well    Behavior During Therapy [facility] for tasks assessed/performed                 Past Medical History:  Diagnosis Date   Breast cancer ([place])    Family history of ovarian cancer 09/02/2017   Family history of prostate cancer in father 09/02/2017   Family history of uterine cancer 09/02/2017   Plantar fasciitis    Past Surgical History:  Procedure Laterality Date   PORT-A-CATH REMOVAL Left 2020   right lumpectomy, sentinel node biopsy, and bilateral mammoplasty  10/2017   [facility]   Patient Active Problem List   Diagnosis Date Noted   Overweight (BMI 25.0-29.9) 12/31/2020   Hyperlipidemia 12/30/2020   Neuropathy due to chemotherapeutic drug ([place]) 12/31/2017   Port-A-Cath in place 11/12/2017   Genetic testing 09/15/2017   Malignant neoplasm of upper-inner quadrant of right breast in female, estrogen receptor positive ([place]) 08/27/2017   Vitamin D deficiency 12/09/2015   Medication management 12/09/2015   Dense breast tissue 12/09/2015    PCP: Hayden Rasmussen, MD  REFERRING PROVIDER: Dorothyann Gibbs, NP  REFERRING DIAG:  R35.0 (ICD-10-CM) - Urinary frequency N94.10 (ICD-10-CM) - Dyspareunia in female T11.6X5A (ICD-10-CM) - Adverse effect of antigonadotrophins, antiestrogens, antiandrogens, not elsewhere classified, initial encounter  THERAPY DIAG:  Muscle weakness (generalized)  Unspecified lack of coordination  Rationale for Evaluation and Treatment Rehabilitation  ONSET DATE: years  SUBJECTIVE:                                                                                                                                                                                            SUBJECTIVE STATEMENT: Patient states she has not been working on dilators as much, but getting back to it  Fluid intake: Yes: 60 oz water, tea sometimes but not regular       PAIN:  Are you having pain? No  NPRS scale: 3-4/10 Rt>Lt inner thigh Pain location:  intercourse, internally vaginally.  Pain reported in feet and thighs   PRECAUTIONS: None  WEIGHT BEARING RESTRICTIONS No  FALLS:  Has patient fallen in last 6 months? 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  PLOF: Independent  PATIENT GOALS to have less pain, more regular voids  PERTINENT HISTORY:  personal history of triple positive breast cancer at age 67 and family history of ovarian cancer, BRCA negative on genetic testing Vaginal atrophy Malignant neoplasm of upper-inner quadrant of right breast in female, estrogen receptor positive   Sexual abuse: No  BOWEL MOVEMENT Pain with bowel movement: No Type of bowel movement:Type (Bristol Stool Scale) 2-3, Frequency daily, and Strain Yes but this has improved since starting using step stool in bathroom Fully empty rectum: No Leakage: No Pads: No Fiber supplement: Yes:    URINATION Pain with urination: No sometimes, rarely will have a burning Fully empty bladder: No Stream: Strong and Weak Urgency: Yes: consistently Frequency: 2 hours sometimes sooner, usually gets up at night 1x Leakage: Coughing and Sneezing Pads: No  INTERCOURSE Pain with intercourse: Initial Penetration, During Penetration, After Intercourse, and Pain Interrupts Intercourse Ability to have vaginal penetration:  Yes:   Climax: not painful but pain prevents climax sometimes Marinoff Scale: 2/3  PREGNANCY Vaginal deliveries 0 Tearing No C-section deliveries  0 Currently pregnant No  PROLAPSE None    OBJECTIVE:   DIAGNOSTIC FINDINGS:   COGNITION:  Overall cognitive status: Within functional limits for tasks assessed     SENSATION:  Light touch: Appears intact   Proprioception: Appears intact  MUSCLE LENGTH: Bil hamstring and adductor limited by 25%                POSTURE: rounded shoulders and forward head    LUMBARAROM/PROM  A/PROM A/PROM  eval  Flexion Limited by 25%  Extension WFL  Right lateral flexion Limited by 25%  Left lateral flexion Limited by 25%  Right rotation WFL  Left rotation WFL   (Blank rows = not tested)  LOWER EXTREMITY ROM:  Bil WFL LOWER EXTREMITY MMT:   Bil hips 4/5 grossly, knees and ankles 5/5   PALPATION:   General  no TTP, mild fascial restrictions noted in lower abdominal quadrants and over bladder                External Perineal Exam no TTP, dryness noted externally                             Internal Pelvic Floor no TTP however increased tension noted throughout  Patient confirms identification and approves PT to assess internal pelvic floor and treatment Yes  PELVIC MMT:   MMT eval 06/03/22  07/06/22  07/20/22  10/02/22   Vaginal 1/5 with max cues and quick release; without cues 0/5; 1s; 1 rep 1/5 initially but with quick release 2/5.   2/5 with minimal cues 2/5 - however with palpation of superficial muscles pt reported pain with palpation at bulbocavernosus bil and had several small trigger points.  3/5  Internal Anal Sphincter       External Anal Sphincter       Puborectalis       Diastasis Recti       (Blank rows = not tested)        TONE: Mildly increased  PROLAPSE: Not seen in hooklying   TODAY'S TREATMENT   10/12/22: Patient confirms identification and approves physical therapist to perform internal soft tissue work  - pt tolerated with mild tenderness Lt>Rt levators and able to get to ileococcygeus and coccygeus  muscles.  Pt was educated on fascial release  with using moisturizers STM to adductors Trigger Point Dry-Needling  Treatment instructions: Expect mild to moderate muscle soreness. S/S of pneumothorax if dry needled over a lung field, and to seek immediate medical attention should they occur. Patient verbalized understanding of these instructions and education.  Patient Consent Given: Yes Education handout provided: Previously provided Muscles treated: bil adductors Electrical stimulation performed: No Parameters: N/A Treatment response/outcome: increased soft tissue length       PATIENT EDUCATION:  Education details: [id number], urge drill  Person educated: Patient Education method: Explanation, Demonstration, Tactile cues, Verbal cues, and Handouts Education comprehension: verbalized understanding and returned demonstration   HOME EXERCISE PROGRAM: [email or web address]  ASSESSMENT:  CLINICAL IMPRESSION: Pt would benefit from additional treatment due to continued pain with vaginal penetration. Pt has been working with dilator use and relaxation techniques and showing improvement but stalled due to not being able to use dilators for a while.    Pt having tension throughout the lower extremities as well and all tension seems to be Rt side more than Lt  Pt had release with soft tissue work provided today and is expected to continue to see improvements with skilled PT.  OBJECTIVE IMPAIRMENTS decreased coordination, decreased endurance, decreased mobility, decreased strength, increased fascial restrictions, increased muscle spasms, impaired flexibility, improper body mechanics, postural dysfunction, and pain.   ACTIVITY LIMITATIONS continence, locomotion level, and intercourse  PARTICIPATION LIMITATIONS: interpersonal relationship and community activity  PERSONAL FACTORS Past/current experiences, Time since onset of injury/illness/exacerbation, and 1 comorbidity: medical history  are also affecting patient's functional outcome.   REHAB  POTENTIAL: Good  CLINICAL DECISION MAKING: Stable/uncomplicated  EVALUATION COMPLEXITY: Low   GOALS: Goals reviewed with patient? Yes  SHORT TERM GOALS: Target date: 05/26/2022  Pt to be I with HEP.  Baseline: Goal status: MET  2.  Pt to be I with use of feminine moisturizers and lubricants as needed for improved vaginal health and decreased pain with penetration.  Baseline:  Goal status: MET  3.  Pt to demonstrated improved coordination with pelvic floor and breathing mechanics with body weight squats without leakage or pain for improved pelvic stability.  Baseline:  Goal status: MET   LONG TERM GOALS: Target date: 11/27/22  Pt to be I with advanced HEP.  Baseline:  Goal status: MET  2.  Pt to demonstrate at least 5/5 bil hip strength for improved pelvic stability and functional squats without leakage.  Baseline:  Goal status: MET  3. Pt to demonstrate at least 3/5 pelvic floor strength for improved pelvic stability and decreased strain at pelvic floor/ decrease leakage.  Baseline:  Goal status: MET  4.  Pt to report improved time between bladder voids to at least 2.5 hours for improved QOL with decreased urinary frequency.   Baseline:  Goal status: met  5.  Pt to report no more than 2/10 pain with vaginal penetration for improved QOL and decreased pain with medical procedures. Baseline:  Goal status: on going - has been improving but not consistently 2 or less/10. At worse 5/10 without extra time for penetration but with extra time and lubrication 3-4/10.     PLAN: PT FREQUENCY: 1-2x/week  PT DURATION: other: 8 weeks     PLANNED INTERVENTIONS: Therapeutic exercises, Therapeutic activity, Neuromuscular re-education, Patient/Family education, Self Care, Joint mobilization, Aquatic Therapy, Dry Needling, Spinal mobilization, Cryotherapy, Moist heat, scar mobilization, Taping, Biofeedback, and Manual therapy  PLAN FOR NEXT SESSION: core/hip strengthening with cues  for  pelvic floor contraction if able, internal manual release as needed, Dry Needling/Manual therapy as needed.  Gustavus Bryant, PT, DPT 10/12/22 11:49 AM     [facility] [address] [place], [place] [address] Phone # [phone] Fax [phone]

## 2022-10-14 ENCOUNTER — Encounter: Payer: Self-pay | Admitting: Physical Therapy

## 2022-10-14 ENCOUNTER — Ambulatory Visit: Payer: BC Managed Care – PPO | Admitting: Physical Therapy

## 2022-10-14 DIAGNOSIS — R293 Abnormal posture: Secondary | ICD-10-CM

## 2022-10-14 DIAGNOSIS — M62838 Other muscle spasm: Secondary | ICD-10-CM

## 2022-10-14 DIAGNOSIS — M6281 Muscle weakness (generalized): Secondary | ICD-10-CM

## 2022-10-14 DIAGNOSIS — M25572 Pain in left ankle and joints of left foot: Secondary | ICD-10-CM

## 2022-10-14 DIAGNOSIS — R279 Unspecified lack of coordination: Secondary | ICD-10-CM

## 2022-10-14 DIAGNOSIS — G620 Drug-induced polyneuropathy: Secondary | ICD-10-CM

## 2022-10-14 DIAGNOSIS — R262 Difficulty in walking, not elsewhere classified: Secondary | ICD-10-CM

## 2022-10-14 NOTE — Therapy (Signed)
OUTPATIENT PHYSICAL THERAPY LOWER EXTREMITY EVALUATION  Progress Note Reporting Period 07/14/23 to 10/01/22  See note below for Objective Data and Assessment of Progress/Goals.     Patient Name: Hannah Woodard MRN: [id number] DOB:May 06, 1971, 52 y.o., female Today's Date: 10/14/2022  END OF SESSION:  PT End of Session - 10/14/22 1555     Visit Number 24    Date for PT Re-Evaluation 11/27/22    PT Start Time 0277    PT Stop Time 1627    PT Time Calculation (min) 40 min    Activity Tolerance Patient tolerated treatment well    Behavior During Therapy [facility] for tasks assessed/performed              Past Medical History:  Diagnosis Date   Breast cancer ([place])    Family history of ovarian cancer 09/02/2017   Family history of prostate cancer in father 09/02/2017   Family history of uterine cancer 09/02/2017   Plantar fasciitis    Past Surgical History:  Procedure Laterality Date   PORT-A-CATH REMOVAL Left 2020   right lumpectomy, sentinel node biopsy, and bilateral mammoplasty  10/2017   [facility]   Patient Active Problem List   Diagnosis Date Noted   Overweight (BMI 25.0-29.9) 12/31/2020   Hyperlipidemia 12/30/2020   Neuropathy due to chemotherapeutic drug ([place]) 12/31/2017   Port-A-Cath in place 11/12/2017   Genetic testing 09/15/2017   Malignant neoplasm of upper-inner quadrant of right breast in female, estrogen receptor positive ([place]) 08/27/2017   Vitamin D deficiency 12/09/2015   Medication management 12/09/2015   Dense breast tissue 12/09/2015    PCP: Horald Pollen  REFERRING PROVIDER: Leeroy Cha  REFERRING DIAG: Neuropathy due to chemotherapy  THERAPY DIAG:  Muscle weakness (generalized)  Unspecified lack of coordination  Pain in left ankle and joints of left foot  Abnormal posture  Difficulty in walking, not elsewhere classified  Peripheral neuropathy due to chemotherapy ([facility])  Other muscle spasm  Rationale for Evaluation and Treatment:  Rehabilitation  ONSET DATE: Patient reports neuropathy for several years since breast cancer diagnosis in 2018  SUBJECTIVE:   SUBJECTIVE STATEMENT: Patient reports that she is scheduled to be fit for her orthotics on Monday. Her achilles are sore, possibly after stretching. Her L knee caught while carrying some logs. It felt like it would give, but did not.  PERTINENT HISTORY: personal history of triple positive breast cancer at age 89 and family history of ovarian cancer, BRCA negative on genetic testing Vaginal atrophy Malignant neoplasm of upper-inner quadrant of right breast in female, estrogen receptor positive  PAIN:  Are you having pain? Yes: NPRS scale: 4/10 Pain location: bilateral feet Pain description: tingling after sitting for extended periods, throbbing, sharp/stabbing pains Aggravating factors: sitting, walking, standing/cooking more than 20 minutes, squatting  Relieving factors: topical CBD, ice socks, topical ointments  PRECAUTIONS: None  WEIGHT BEARING RESTRICTIONS: No  FALLS:  Has patient fallen in last 6 months? No  LIVING ENVIRONMENT: Lives with: lives with their spouse Lives in: House/apartment Stairs: Yes: External: 5 steps; on left going up Has following equipment at home: None  OCCUPATION: Consulting civil engineer  PLOF: Independent  PATIENT GOALS: walk without pain  NEXT MD VISIT:   OBJECTIVE:   DIAGNOSTIC FINDINGS: TTP bilateral feet and plantar fascia. Decreased joint mobility in MTPs and talocrural joint. Decreased ankle mobility limiting ability to squat, bend, lift, and with decreased activity tolerance.   PATIENT SURVEYS:  LEFS administer at next session  COGNITION: Overall cognitive status: Within functional  limits for tasks assessed     SENSATION: Light touch: decreased touch on left LE vs left   POSTURE: decreased lumbar lordosis and posterior pelvic tilt  PALPATION: Significant TTP bilateral plantar fascia, gastroc, anterior tib,  posterior tib, foot intrinsics  LOWER EXTREMITY ROM:  Active ROM Right eval Left eval  Hip flexion    Hip extension    Hip abduction    Hip adduction    Hip internal rotation    Hip external rotation    Knee flexion    Knee extension    Ankle dorsiflexion -4 6  Ankle plantarflexion 47 51  Ankle inversion 40 42  Ankle eversion 11 17   (Blank rows = not tested)  LOWER EXTREMITY MMT:  MMT Right eval Left eval  Hip flexion    Hip extension    Hip abduction    Hip adduction    Hip internal rotation    Hip external rotation    Knee flexion    Knee extension    Ankle dorsiflexion    Ankle plantarflexion    Ankle inversion    Ankle eversion     (Blank rows = not tested)  LOWER EXTREMITY SPECIAL TESTS:  Ankle special tests: Tibial torsion test: negative, Talar tilt test: negative, and Thompson's test: negative  FUNCTIONAL TESTS:  Timed up and go (TUG): 10.98  GAIT: Distance walked: 25 Assistive device utilized: None Level of assistance: Complete Independence Comments: scissoring and decreased stride length bilaterally   TODAY'S TREATMENT:                                                                                                                              DATE:  10/14/22 NuStep L5 x 6 minutes  Sit to stand with feet on air ex, holding 1# ball, 10 reps with OHP, then repeat with chest press Seated B knee flexion, 20#, 2 x 10 reps Seated B knee ext, 5#, 2 x 10 reps Standing on upside down BOSU- lateral weight shifts, forward and back, then circular weight shifts in each, BUE support, 10 each  B heel raises and stretches with knee straight and slightly flexed, 10 reps Standing hip abd against Red Tband resistance 10 reps each, then 5 more each  10/07/22 NuStep L5 x 6 minutes Seated knee flex, 20#, 2 x 10 B LE. Seated knee ext 5#, B, 2 x 10 reps Sit to stand on Air ex, with Red Tband resistance at knees for abd, 10 reps B side stepping against Red tband  resistance at knees, 4 x 5 steps each way Standing shoulder ext, rows, 10# 2 x 10 Shoulder ER against G tband, 2 x 10 reps Heel raises from the floor with UUE support, 2 x 10 reps Seated ankle eversion against yellow Tband, 2 x 10 reps Dorsalis ant stretch, sitting on feet.   10/01/22 Bike L3 x 6 minutes Supine clamshells against G tband x 10 Bridge with hip abd  against G tband x 10 STM with tennis ball, B calves in sit Stanidng wall stretch for gastroc and soleus B  09/25/2022    PATIENT EDUCATION:  Education details: Discussed objective findings. Discussed POC and HEP Person educated: Patient Education method: Explanation, Demonstration, and Handouts Education comprehension: verbalized understanding, returned demonstration, and needs further education  HOME EXERCISE PROGRAM: Access Code: [email or web address] URL: [email or web address] Date: 09/25/2022 Prepared by: Rochel Brome Diy   ASSESSMENT:  CLINICAL IMPRESSION: Patient reports that she has an appointment for orthotics next Monday. She is having some pain in calves and L knee feels weak. Tolerated increased activity today with only mild pain, relieved with rest.  OBJECTIVE IMPAIRMENTS: Abnormal gait, decreased balance, difficulty walking, decreased ROM, decreased strength, and pain.   ACTIVITY LIMITATIONS: carrying, lifting, bending, standing, squatting, and caring for others  PARTICIPATION LIMITATIONS: community activity and occupation  PERSONAL FACTORS: 1-2 comorbidities: history of cancer and chronicity of symptoms  are also affecting patient's functional outcome.   REHAB POTENTIAL: Fair due to chemo and chronicity of symptoms/neuropathy  CLINICAL DECISION MAKING: Stable/uncomplicated  EVALUATION COMPLEXITY: Low   GOALS: Goals reviewed with patient? No  SHORT TERM GOALS: Target date: 10/23/2022   Patient will be independent and compliant with HEP to improve carryover of sessions Baseline: HEP  provided. See above Goal status: met  2.  Patient will be able to demonstrate DF of right ankle to neutral in order to improve gait and mobility Baseline: lacking 4 degrees of neutral on right  Goal status: ongoing  3.  Patient will be able to walk greater than 30 minutes without pain in order to improve work tolerance Baseline: Unable to walk greater than 20 minutes without pain at this time Goal status: INITIAL  4.  Patient will be able to squat and lift 15lbs from floor without increase in pain to perform housechores and take care of animals Baseline: squats with significantly rounded lumbar spine and is unable to lift greater than 10lbs without difficulty Goal status: INITIAL    LONG TERM GOALS: Target date: 11/20/2022    Patient will be able ambulate greater than 1 hour to improve tolerance to work demands and ease with house chores Baseline: unable to stand/walk greater than 20 minutes  Goal status: INITIAL  2.  Patient will be able to demonstrate proper lifting mechanics to lift and squat greater than 30lbs from floor for work tasks and house chores Baseline:  Goal status: INITIAL   PLAN:  PT FREQUENCY: 1x/week  PT DURATION: 8 weeks  PLANNED INTERVENTIONS: Therapeutic exercises, Therapeutic activity, Neuromuscular re-education, Balance training, Gait training, Patient/Family education, Self Care, Joint mobilization, Joint manipulation, Stair training, Dry Needling, Electrical stimulation, Cryotherapy, Moist heat, Taping, Vasopneumatic device, Traction, Manual therapy, and Re-evaluation  PLAN FOR NEXT SESSION: Continue PT services   Ethel Rana DPT 10/14/22 4:31 PM

## 2022-10-19 ENCOUNTER — Ambulatory Visit: Payer: BC Managed Care – PPO | Admitting: Family Medicine

## 2022-10-19 VITALS — BP 122/82 | Ht 65.5 in | Wt 160.0 lb

## 2022-10-19 DIAGNOSIS — Q6672 Congenital pes cavus, left foot: Secondary | ICD-10-CM

## 2022-10-19 DIAGNOSIS — Q6671 Congenital pes cavus, right foot: Secondary | ICD-10-CM

## 2022-10-19 DIAGNOSIS — R269 Unspecified abnormalities of gait and mobility: Secondary | ICD-10-CM

## 2022-10-19 NOTE — Therapy (Signed)
OUTPATIENT PHYSICAL THERAPY FEMALE PELVIC TREATMENT   Patient Name: Hannah Woodard MRN: [id number] DOB:10/05/1970, 52 y.o., female Today's Date: 10/20/2022   PT End of Session - 10/20/22 1111     Visit Number 25    Date for PT Re-Evaluation 11/27/22    Authorization Type BCBS    PT Start Time 1107    PT Stop Time 1145    PT Time Calculation (min) 38 min    Activity Tolerance Patient tolerated treatment well    Behavior During Therapy [facility] for tasks assessed/performed                  Past Medical History:  Diagnosis Date   Breast cancer ([place])    Family history of ovarian cancer 09/02/2017   Family history of prostate cancer in father 09/02/2017   Family history of uterine cancer 09/02/2017   Plantar fasciitis    Past Surgical History:  Procedure Laterality Date   PORT-A-CATH REMOVAL Left 2020   right lumpectomy, sentinel node biopsy, and bilateral mammoplasty  10/2017   [facility]   Patient Active Problem List   Diagnosis Date Noted   Overweight (BMI 25.0-29.9) 12/31/2020   Hyperlipidemia 12/30/2020   Neuropathy due to chemotherapeutic drug ([place]) 12/31/2017   Port-A-Cath in place 11/12/2017   Genetic testing 09/15/2017   Malignant neoplasm of upper-inner quadrant of right breast in female, estrogen receptor positive ([place]) 08/27/2017   Vitamin D deficiency 12/09/2015   Medication management 12/09/2015   Dense breast tissue 12/09/2015    PCP: Hayden Rasmussen, MD  REFERRING PROVIDER: Dorothyann Gibbs, NP  REFERRING DIAG:  R35.0 (ICD-10-CM) - Urinary frequency N94.10 (ICD-10-CM) - Dyspareunia in female T24.6X5A (ICD-10-CM) - Adverse effect of antigonadotrophins, antiestrogens, antiandrogens, not elsewhere classified, initial encounter  THERAPY DIAG:  Abnormal posture  Muscle weakness (generalized)  Unspecified lack of coordination  Other muscle spasm  Rationale for Evaluation and Treatment Rehabilitation  ONSET DATE: years  SUBJECTIVE:                                                                                                                                                                                            SUBJECTIVE STATEMENT: Patient states she has not been working on dilators as much, but getting back to it  Fluid intake: Yes: 60 oz water, tea sometimes but not regular       PAIN:  Are you having pain? No  NPRS scale: 3-4/10 Rt>Lt inner thigh Pain location:  intercourse, internally vaginally.  Pain reported in feet and thighs   PRECAUTIONS: None  WEIGHT BEARING RESTRICTIONS No  FALLS:  Has patient fallen  in last 6 months? 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  PLOF: Independent  PATIENT GOALS to have less pain, more regular voids  PERTINENT HISTORY:  personal history of triple positive breast cancer at age 79 and family history of ovarian cancer, BRCA negative on genetic testing Vaginal atrophy Malignant neoplasm of upper-inner quadrant of right breast in female, estrogen receptor positive   Sexual abuse: No  BOWEL MOVEMENT Pain with bowel movement: No Type of bowel movement:Type (Bristol Stool Scale) 2-3, Frequency daily, and Strain Yes but this has improved since starting using step stool in bathroom Fully empty rectum: No Leakage: No Pads: No Fiber supplement: Yes:    URINATION Pain with urination: No sometimes, rarely will have a burning Fully empty bladder: No Stream: Strong and Weak Urgency: Yes: consistently Frequency: 2 hours sometimes sooner, usually gets up at night 1x Leakage: Coughing and Sneezing Pads: No  INTERCOURSE Pain with intercourse: Initial Penetration, During Penetration, After Intercourse, and Pain Interrupts Intercourse Ability to have vaginal penetration:  Yes:   Climax: not painful but pain prevents climax sometimes Marinoff Scale: 2/3  PREGNANCY Vaginal  deliveries 0 Tearing No C-section deliveries 0 Currently pregnant No  PROLAPSE None    OBJECTIVE:   DIAGNOSTIC FINDINGS:   COGNITION:  Overall cognitive status: Within functional limits for tasks assessed     SENSATION:  Light touch: Appears intact   Proprioception: Appears intact  MUSCLE LENGTH: Bil hamstring and adductor limited by 25%                POSTURE: rounded shoulders and forward head    LUMBARAROM/PROM  A/PROM A/PROM  eval  Flexion Limited by 25%  Extension WFL  Right lateral flexion Limited by 25%  Left lateral flexion Limited by 25%  Right rotation WFL  Left rotation WFL   (Blank rows = not tested)  LOWER EXTREMITY ROM:  Bil WFL LOWER EXTREMITY MMT:   Bil hips 4/5 grossly, knees and ankles 5/5   PALPATION:   General  no TTP, mild fascial restrictions noted in lower abdominal quadrants and over bladder                External Perineal Exam no TTP, dryness noted externally                             Internal Pelvic Floor no TTP however increased tension noted throughout  Patient confirms identification and approves PT to assess internal pelvic floor and treatment Yes  PELVIC MMT:   MMT eval 06/03/22  07/06/22  07/20/22  10/02/22   Vaginal 1/5 with max cues and quick release; without cues 0/5; 1s; 1 rep 1/5 initially but with quick release 2/5.   2/5 with minimal cues 2/5 - however with palpation of superficial muscles pt reported pain with palpation at bulbocavernosus bil and had several small trigger points.  3/5  Internal Anal Sphincter       External Anal Sphincter       Puborectalis       Diastasis Recti       (Blank rows = not tested)        TONE: Mildly increased  PROLAPSE: Not seen in hooklying   TODAY'S TREATMENT   10/20/22: Patient confirms identification and approves physical therapist to perform internal soft tissue work  - pt tolerated with moderate tenderness Lt>Rt fascia  and levators and focus more on vaginal canal  fascial release as this was more tight today.  Pt was educated on fascial release with using moisturizers         PATIENT EDUCATION:  Education details: [id number], urge drill  Person educated: Patient Education method: Explanation, Demonstration, Tactile cues, Verbal cues, and Handouts Education comprehension: verbalized understanding and returned demonstration   HOME EXERCISE PROGRAM: [email or web address]  ASSESSMENT:  CLINICAL IMPRESSION: Pt still having a hard time with time to do the dilators.  Today's session focused on release to vaginal canal and fascial restriction that were palpated throughout.  Pt had some tenderness that was more intense on the left side.  Tenderness was less after today's treatment and pt will benefit from skilled PT to continue to address soft tissue limitations for return to intercourse.  OBJECTIVE IMPAIRMENTS decreased coordination, decreased endurance, decreased mobility, decreased strength, increased fascial restrictions, increased muscle spasms, impaired flexibility, improper body mechanics, postural dysfunction, and pain.   ACTIVITY LIMITATIONS continence, locomotion level, and intercourse  PARTICIPATION LIMITATIONS: interpersonal relationship and community activity  PERSONAL FACTORS Past/current experiences, Time since onset of injury/illness/exacerbation, and 1 comorbidity: medical history  are also affecting patient's functional outcome.   REHAB POTENTIAL: Good  CLINICAL DECISION MAKING: Stable/uncomplicated  EVALUATION COMPLEXITY: Low   GOALS: Goals reviewed with patient? Yes  SHORT TERM GOALS: Target date: 05/26/2022  Pt to be I with HEP.  Baseline: Goal status: MET  2.  Pt to be I with use of feminine moisturizers and lubricants as needed for improved vaginal health and decreased pain with penetration.  Baseline:  Goal status: MET  3.  Pt to demonstrated improved coordination with pelvic floor and breathing mechanics with body weight  squats without leakage or pain for improved pelvic stability.  Baseline:  Goal status: MET   LONG TERM GOALS: Target date: 11/27/22  Pt to be I with advanced HEP.  Baseline:  Goal status: MET  2.  Pt to demonstrate at least 5/5 bil hip strength for improved pelvic stability and functional squats without leakage.  Baseline:  Goal status: MET  3. Pt to demonstrate at least 3/5 pelvic floor strength for improved pelvic stability and decreased strain at pelvic floor/ decrease leakage.  Baseline:  Goal status: MET  4.  Pt to report improved time between bladder voids to at least 2.5 hours for improved QOL with decreased urinary frequency.   Baseline:  Goal status: met  5.  Pt to report no more than 2/10 pain with vaginal penetration for improved QOL and decreased pain with medical procedures. Baseline:  Goal status: on going - has been improving but not consistently 2 or less/10. At worse 5/10 without extra time for penetration but with extra time and lubrication 3-4/10.     PLAN: PT FREQUENCY: 1-2x/week  PT DURATION: other: 8 weeks     PLANNED INTERVENTIONS: Therapeutic exercises, Therapeutic activity, Neuromuscular re-education, Patient/Family education, Self Care, Joint mobilization, Aquatic Therapy, Dry Needling, Spinal mobilization, Cryotherapy, Moist heat, scar mobilization, Taping, Biofeedback, and Manual therapy  PLAN FOR NEXT SESSION: core/hip strengthening, continue internal manual release and lumbar fascial release  Gustavus Bryant, PT, DPT 10/20/22 11:14 AM     [facility] [address], [place], [place] [address] Phone # [phone] Fax [phone]

## 2022-10-20 ENCOUNTER — Ambulatory Visit: Payer: BC Managed Care – PPO | Attending: Gynecologic Oncology | Admitting: Physical Therapy

## 2022-10-20 ENCOUNTER — Encounter: Payer: Self-pay | Admitting: Family Medicine

## 2022-10-20 DIAGNOSIS — M6281 Muscle weakness (generalized): Secondary | ICD-10-CM | POA: Diagnosis present

## 2022-10-20 DIAGNOSIS — R279 Unspecified lack of coordination: Secondary | ICD-10-CM | POA: Diagnosis present

## 2022-10-20 DIAGNOSIS — R293 Abnormal posture: Secondary | ICD-10-CM | POA: Diagnosis present

## 2022-10-20 DIAGNOSIS — M62838 Other muscle spasm: Secondary | ICD-10-CM | POA: Diagnosis present

## 2022-10-20 NOTE — Progress Notes (Signed)
PCP: Hayden Rasmussen, MD  Subjective:   HPI: Patient is a 52 y.o. female here for custom orthotics.  Patient reports she's had heel, arch, toe pain for several years. 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 Known high arches. No injury or trauma.  Past Medical History:  Diagnosis Date   Breast cancer [facility])    Family history of ovarian cancer 09/02/2017   Family history of prostate cancer in father 09/02/2017   Family history of uterine cancer 09/02/2017   Plantar fasciitis     Current Outpatient Medications on File Prior to Visit  Medication Sig Dispense Refill   anastrozole (ARIMIDEX) 1 MG tablet Take 1 tablet (1 mg total) by mouth daily. 90 tablet 4   ascorbic acid (VITAMIN C) 1000 MG tablet Take by mouth.     b complex vitamins tablet Take 1 tablet by mouth daily.     Bioflavonoid Products (QUERCETIN COMPLEX IMMUNE PO) Take by mouth. W/ bromelain     Cholecalciferol (VITAMIN D3) 5000 units TABS Take 10,000 Units by mouth daily.     COLLAGEN PO Take by mouth daily. Takes beef collagen     desoximetasone (TOPICORT) 0.25 % cream Apply topically daily as needed.     fish oil-omega-3 fatty acids 1000 MG capsule Take 1 g by mouth daily.     gabapentin (NEURONTIN) 300 MG capsule Take 1 capsule (300 mg total) by mouth at bedtime. 90 capsule 4   glucosamine-chondroitin 500-400 MG tablet Take 3 tablets by mouth daily.     Green Tea 200 MG CAPS Take 1 capsule by mouth daily in the afternoon.     lactobacillus acidophilus (BACID) TABS tablet Take 2 tablets by mouth 3 (three) times daily.     Magnesium Citrate POWD Take by mouth daily.     Menaquinone-7 (VITAMIN K2 PO) Take 150 mcg by mouth daily.     montelukast (SINGULAIR) 10 MG tablet TAKE 1 TABLET (10 MG) BY MOUTH EVERY EVENING     NEOMYCIN-POLYMYXIN-HYDROCORTISONE (CORTISPORIN) 1 % SOLN OTIC solution 4 drops 3 (three) times daily.     ondansetron (ZOFRAN-ODT) 4 MG disintegrating tablet TAKE 1 TABLET  BY MOUTH FOUR TIMES A DAY AS NEEDED FOR NAUSEA     senna (SENOKOT) 8.6 MG tablet Take 1 tablet by mouth at bedtime as needed for constipation.     triamcinolone ointment (KENALOG) 0.1 % Apply 1 application topically 2 (two) times daily. 80 g 1   TURMERIC PO Take 1 capsule by mouth as directed.      UNABLE TO FIND Take 500 mg by mouth daily. [place] mushroom     UNABLE TO FIND Take 500 mg by mouth daily. [place] Mushroom     [DISCONTINUED] prochlorperazine (COMPAZINE) 10 MG tablet Take 1 tablet (10 mg total) by mouth every 6 (six) hours as needed (Nausea or vomiting). 30 tablet 1   No current facility-administered medications on file prior to visit.    Past Surgical History:  Procedure Laterality Date   PORT-A-CATH REMOVAL Left 2020   right lumpectomy, sentinel node biopsy, and bilateral mammoplasty  10/2017   [facility]    Allergies  Allergen Reactions   Sulfamethoxazole-Trimethoprim Rash   Adhesive [Tape]    Milk-Related Compounds Diarrhea    Stomach cramping   Other Rash and Other (See Comments)    Frangrance   Poison Ivy Extract [Poison Ivy Extract] Hives, Itching and Rash    BP 122/82   Ht 5' 5.5" (1.664 m)  Wt 160 lb (72.6 kg)   LMP 10/05/2017   BMI 26.22 kg/m       No data to display              No data to display              Objective:  Physical Exam:  Gen: NAD, comfortable in exam room  Bilateral feet/ankles: Pes cavus.  Mild transverse arch collapse but no callus overlying MT heads. FROM without hallux rigidus. Mild TTP distal achilles bilaterally. Thompsons test negative. NV intact distally. Leg lengths equal.  Mild intoeing bilaterally.   Assessment & Plan:  1. Bilateral leg pain - underlying peripheral neuropathy.  Pain in plantar fascia, achilles tendons.  Continue with her physical therapy.  Custom orthotics made today.  Patient was fitted for a : standard, cushioned, semi-rigid orthotic. The orthotic was heated and afterward the  patient stood on the orthotic blank positioned on the orthotic stand. The patient was positioned in subtalar neutral position and 10 degrees of ankle dorsiflexion in a weight bearing stance. After completion of molding, a stable base was applied to the orthotic blank. The blank was ground to a stable position for weight bearing. Size: 7 Base: blue med density eva Posting: none Additional orthotic padding: none  Total visit time 30 minutes including documentation.

## 2022-10-21 ENCOUNTER — Encounter: Payer: Self-pay | Admitting: Physical Therapy

## 2022-10-21 ENCOUNTER — Ambulatory Visit: Payer: BC Managed Care – PPO | Admitting: Physical Therapy

## 2022-10-21 DIAGNOSIS — M25572 Pain in left ankle and joints of left foot: Secondary | ICD-10-CM | POA: Diagnosis not present

## 2022-10-21 DIAGNOSIS — G620 Drug-induced polyneuropathy: Secondary | ICD-10-CM

## 2022-10-21 DIAGNOSIS — R262 Difficulty in walking, not elsewhere classified: Secondary | ICD-10-CM

## 2022-10-21 DIAGNOSIS — M62838 Other muscle spasm: Secondary | ICD-10-CM

## 2022-10-21 DIAGNOSIS — M6281 Muscle weakness (generalized): Secondary | ICD-10-CM

## 2022-10-21 DIAGNOSIS — R293 Abnormal posture: Secondary | ICD-10-CM

## 2022-10-21 DIAGNOSIS — R279 Unspecified lack of coordination: Secondary | ICD-10-CM

## 2022-10-21 NOTE — Therapy (Signed)
OUTPATIENT PHYSICAL THERAPY LOWER EXTREMITY EVALUATION  Progress Note Reporting Period 07/14/23 to 10/01/22  See note below for Objective Data and Assessment of Progress/Goals.     Patient Name: Hannah Woodard MRN: [id number] DOB:1971/02/07, 52 y.o., female Today's Date: 10/21/2022  END OF SESSION:  PT End of Session - 10/21/22 1552     Visit Number 26    Date for PT Re-Evaluation 11/20/22    PT Start Time 1548    PT Stop Time 1628    PT Time Calculation (min) 40 min    Activity Tolerance Patient tolerated treatment well    Behavior During Therapy [facility] for tasks assessed/performed              Past Medical History:  Diagnosis Date   Breast cancer ([place])    Family history of ovarian cancer 09/02/2017   Family history of prostate cancer in father 09/02/2017   Family history of uterine cancer 09/02/2017   Plantar fasciitis    Past Surgical History:  Procedure Laterality Date   PORT-A-CATH REMOVAL Left 2020   right lumpectomy, sentinel node biopsy, and bilateral mammoplasty  10/2017   [facility]   Patient Active Problem List   Diagnosis Date Noted   Overweight (BMI 25.0-29.9) 12/31/2020   Hyperlipidemia 12/30/2020   Neuropathy due to chemotherapeutic drug ([place]) 12/31/2017   Port-A-Cath in place 11/12/2017   Genetic testing 09/15/2017   Malignant neoplasm of upper-inner quadrant of right breast in female, estrogen receptor positive ([place]) 08/27/2017   Vitamin D deficiency 12/09/2015   Medication management 12/09/2015   Dense breast tissue 12/09/2015    PCP: Horald Pollen  REFERRING PROVIDER: Leeroy Cha  REFERRING DIAG: Neuropathy due to chemotherapy  THERAPY DIAG:  Abnormal posture  Muscle weakness (generalized)  Unspecified lack of coordination  Peripheral neuropathy due to chemotherapy ([facility])  Other muscle spasm  Pain in left ankle and joints of left foot  Difficulty in walking, not elsewhere classified  Rationale for Evaluation and Treatment:  Rehabilitation  ONSET DATE: Patient reports neuropathy for several years since breast cancer diagnosis in 2018  SUBJECTIVE:   SUBJECTIVE STATEMENT: Patient has her new orthotics. She says she wore them too long yesterday and was a bit sore, but will slowly increase her  time in them. L knee has been bothering her.  PERTINENT HISTORY: personal history of triple positive breast cancer at age 60 and family history of ovarian cancer, BRCA negative on genetic testing Vaginal atrophy Malignant neoplasm of upper-inner quadrant of right breast in female, estrogen receptor positive  PAIN:  Are you having pain? Yes: NPRS scale: 4/10 Pain location: bilateral feet Pain description: tingling after sitting for extended periods, throbbing, sharp/stabbing pains Aggravating factors: sitting, walking, standing/cooking more than 20 minutes, squatting  Relieving factors: topical CBD, ice socks, topical ointments  PRECAUTIONS: None  WEIGHT BEARING RESTRICTIONS: No  FALLS:  Has patient fallen in last 6 months? No  LIVING ENVIRONMENT: Lives with: lives with their spouse Lives in: House/apartment Stairs: Yes: External: 5 steps; on left going up Has following equipment at home: None  OCCUPATION: Consulting civil engineer  PLOF: Independent  PATIENT GOALS: walk without pain  NEXT MD VISIT:   OBJECTIVE:   DIAGNOSTIC FINDINGS: TTP bilateral feet and plantar fascia. Decreased joint mobility in MTPs and talocrural joint. Decreased ankle mobility limiting ability to squat, bend, lift, and with decreased activity tolerance.   PATIENT SURVEYS:  LEFS administer at next session  COGNITION: Overall cognitive status: Within functional limits for tasks assessed  SENSATION: Light touch: decreased touch on left LE vs left   POSTURE: decreased lumbar lordosis and posterior pelvic tilt  PALPATION: Significant TTP bilateral plantar fascia, gastroc, anterior tib, posterior tib, foot intrinsics  LOWER  EXTREMITY ROM:  Active ROM Right eval Left eval  Hip flexion    Hip extension    Hip abduction    Hip adduction    Hip internal rotation    Hip external rotation    Knee flexion    Knee extension    Ankle dorsiflexion -4 6  Ankle plantarflexion 47 51  Ankle inversion 40 42  Ankle eversion 11 17   (Blank rows = not tested)  LOWER EXTREMITY MMT:  MMT Right eval Left eval  Hip flexion    Hip extension    Hip abduction    Hip adduction    Hip internal rotation    Hip external rotation    Knee flexion    Knee extension    Ankle dorsiflexion    Ankle plantarflexion    Ankle inversion    Ankle eversion     (Blank rows = not tested)  LOWER EXTREMITY SPECIAL TESTS:  Ankle special tests: Tibial torsion test: negative, Talar tilt test: negative, and Thompson's test: negative  FUNCTIONAL TESTS:  Timed up and go (TUG): 10.98  GAIT: Distance walked: 25 Assistive device utilized: None Level of assistance: Complete Independence Comments: scissoring and decreased stride length bilaterally   TODAY'S TREATMENT:                                                                                                                              DATE:  10/21/22 NuStep L5 x 6 minutes. Gait assessment with new orthotics. Feet appear to be in mid alignment Standing on upside down BOSU-Ant/post weight shifts, lateral weight shifts, no UE support. Static stand 3 x 30 sec Seated knee flexion, 20#, 2 x 10 reps Seated knee ext, 5#, 2 x 10 reps Supine with legs over physioball- bridge with pelvic tilt 10 reps Supine clamshells against G tband, 2 x 10 reps Standing B side step, 2 x 10 reps with G tband.  10/14/22 NuStep L5 x 6 minutes  Sit to stand with feet on air ex, holding 1# ball, 10 reps with OHP, then repeat with chest press Seated B knee flexion, 20#, 2 x 10 reps Seated B knee ext, 5#, 2 x 10 reps Standing on upside down BOSU- lateral weight shifts, forward and back, then circular  weight shifts in each, BUE support, 10 each  B heel raises and stretches with knee straight and slightly flexed, 10 reps Standing hip abd against Red Tband resistance 10 reps each, then 5 more each  10/07/22 NuStep L5 x 6 minutes Seated knee flex, 20#, 2 x 10 B LE. Seated knee ext 5#, B, 2 x 10 reps Sit to stand on Air ex, with Red Tband resistance at knees for abd, 10 reps B side  stepping against Red tband resistance at knees, 4 x 5 steps each way Standing shoulder ext, rows, 10# 2 x 10 Shoulder ER against G tband, 2 x 10 reps Heel raises from the floor with UUE support, 2 x 10 reps Seated ankle eversion against yellow Tband, 2 x 10 reps Dorsalis ant stretch, sitting on feet.   10/01/22 Bike L3 x 6 minutes Supine clamshells against G tband x 10 Bridge with hip abd against G tband x 10 STM with tennis ball, B calves in sit Stanidng wall stretch for gastroc and soleus B  09/25/2022    PATIENT EDUCATION:  Education details: Discussed objective findings. Discussed POC and HEP Person educated: Patient Education method: Explanation, Demonstration, and Handouts Education comprehension: verbalized understanding, returned demonstration, and needs further education  HOME EXERCISE PROGRAM: Access Code: [email or web address] URL: [email or web address] Date: 09/25/2022 Prepared by: Rochel Brome Diy   ASSESSMENT:  CLINICAL IMPRESSION: Patient received her orthotics and is trying to increase wear time. Treatment focused on some WB through B feet with weight shifts to get familiar with her orthotics. Limited standing activities today. Addressed trunk stability, but she had participated in PE today and had already performed several activities.  OBJECTIVE IMPAIRMENTS: Abnormal gait, decreased balance, difficulty walking, decreased ROM, decreased strength, and pain.   ACTIVITY LIMITATIONS: carrying, lifting, bending, standing, squatting, and caring for others  PARTICIPATION  LIMITATIONS: community activity and occupation  PERSONAL FACTORS: 1-2 comorbidities: history of cancer and chronicity of symptoms  are also affecting patient's functional outcome.   REHAB POTENTIAL: Fair due to chemo and chronicity of symptoms/neuropathy  CLINICAL DECISION MAKING: Stable/uncomplicated  EVALUATION COMPLEXITY: Low   GOALS: Goals reviewed with patient? No  SHORT TERM GOALS: Target date: 10/23/2022   Patient will be independent and compliant with HEP to improve carryover of sessions Baseline: HEP provided. See above Goal status: met  2.  Patient will be able to demonstrate DF of right ankle to neutral in order to improve gait and mobility Baseline: lacking 4 degrees of neutral on right  Goal status: ongoing  3.  Patient will be able to walk greater than 30 minutes without pain in order to improve work tolerance Baseline: Unable to walk greater than 20 minutes without pain at this time Goal status: INITIAL  4.  Patient will be able to squat and lift 15lbs from floor without increase in pain to perform housechores and take care of animals Baseline: squats with significantly rounded lumbar spine and is unable to lift greater than 10lbs without difficulty Goal status: INITIAL    LONG TERM GOALS: Target date: 11/20/2022    Patient will be able ambulate greater than 1 hour to improve tolerance to work demands and ease with house chores Baseline: unable to stand/walk greater than 20 minutes  Goal status: ongoing  2.  Patient will be able to demonstrate proper lifting mechanics to lift and squat greater than 30lbs from floor for work tasks and house chores Baseline:  Goal status: INITIAL   PLAN:  PT FREQUENCY: 1x/week  PT DURATION: 8 weeks  PLANNED INTERVENTIONS: Therapeutic exercises, Therapeutic activity, Neuromuscular re-education, Balance training, Gait training, Patient/Family education, Self Care, Joint mobilization, Joint manipulation, Stair training, Dry  Needling, Electrical stimulation, Cryotherapy, Moist heat, Taping, Vasopneumatic device, Traction, Manual therapy, and Re-evaluation  PLAN FOR NEXT SESSION: Continue PT services   Ethel Rana DPT 10/21/22 4:24 PM

## 2022-10-28 ENCOUNTER — Inpatient Hospital Stay: Payer: BC Managed Care – PPO | Attending: Hematology and Oncology

## 2022-10-28 ENCOUNTER — Other Ambulatory Visit: Payer: Self-pay

## 2022-10-28 VITALS — BP 122/74 | HR 74 | Temp 98.6°F | Resp 18

## 2022-10-28 DIAGNOSIS — Z5111 Encounter for antineoplastic chemotherapy: Secondary | ICD-10-CM | POA: Diagnosis not present

## 2022-10-28 DIAGNOSIS — Z95828 Presence of other vascular implants and grafts: Secondary | ICD-10-CM

## 2022-10-28 DIAGNOSIS — C50211 Malignant neoplasm of upper-inner quadrant of right female breast: Secondary | ICD-10-CM | POA: Diagnosis present

## 2022-10-28 DIAGNOSIS — Z170 Estrogen receptor positive status [ER+]: Secondary | ICD-10-CM | POA: Insufficient documentation

## 2022-10-28 MED ORDER — GOSERELIN ACETATE 10.8 MG ~~LOC~~ IMPL
10.8000 mg | DRUG_IMPLANT | Freq: Once | SUBCUTANEOUS | Status: AC
  Administered 2022-10-28: 10.8 mg via SUBCUTANEOUS
  Filled 2022-10-28: qty 10.8

## 2022-10-30 ENCOUNTER — Ambulatory Visit: Payer: BC Managed Care – PPO | Attending: Physical Medicine and Rehabilitation | Admitting: Physical Therapy

## 2022-10-30 DIAGNOSIS — M6281 Muscle weakness (generalized): Secondary | ICD-10-CM | POA: Insufficient documentation

## 2022-10-30 DIAGNOSIS — R293 Abnormal posture: Secondary | ICD-10-CM | POA: Insufficient documentation

## 2022-10-30 DIAGNOSIS — R279 Unspecified lack of coordination: Secondary | ICD-10-CM | POA: Diagnosis present

## 2022-10-30 DIAGNOSIS — R262 Difficulty in walking, not elsewhere classified: Secondary | ICD-10-CM | POA: Diagnosis present

## 2022-10-30 DIAGNOSIS — M25572 Pain in left ankle and joints of left foot: Secondary | ICD-10-CM | POA: Insufficient documentation

## 2022-10-30 DIAGNOSIS — T451X5A Adverse effect of antineoplastic and immunosuppressive drugs, initial encounter: Secondary | ICD-10-CM | POA: Diagnosis present

## 2022-10-30 DIAGNOSIS — M62838 Other muscle spasm: Secondary | ICD-10-CM | POA: Insufficient documentation

## 2022-10-30 DIAGNOSIS — G620 Drug-induced polyneuropathy: Secondary | ICD-10-CM | POA: Diagnosis present

## 2022-10-30 NOTE — Therapy (Signed)
OUTPATIENT PHYSICAL THERAPY LOWER EXTREMITY   Progress Note Reporting Period 07/14/23 to 10/01/22  See note below for Objective Data and Assessment of Progress/Goals.     Patient Name: Hannah Woodard MRN: [id number] DOB:October 21, 1970, 52 y.o., female Today's Date: 10/30/2022  END OF SESSION:  PT End of Session - 10/30/22 1100     Visit Number 27    Date for PT Re-Evaluation 11/20/22    Authorization Type [employer]    PT Start Time 1100    PT Stop Time 1140    PT Time Calculation (min) 40 min              Past Medical History:  Diagnosis Date   Breast cancer ([place])    Family history of ovarian cancer 09/02/2017   Family history of prostate cancer in father 09/02/2017   Family history of uterine cancer 09/02/2017   Plantar fasciitis    Past Surgical History:  Procedure Laterality Date   PORT-A-CATH REMOVAL Left 2020   right lumpectomy, sentinel node biopsy, and bilateral mammoplasty  10/2017   [facility]   Patient Active Problem List   Diagnosis Date Noted   Overweight (BMI 25.0-29.9) 12/31/2020   Hyperlipidemia 12/30/2020   Neuropathy due to chemotherapeutic drug ([place]) 12/31/2017   Port-A-Cath in place 11/12/2017   Genetic testing 09/15/2017   Malignant neoplasm of upper-inner quadrant of right breast in female, estrogen receptor positive ([place]) 08/27/2017   Vitamin D deficiency 12/09/2015   Medication management 12/09/2015   Dense breast tissue 12/09/2015    PCP: Horald Pollen  REFERRING PROVIDER: Leeroy Cha  REFERRING DIAG: Neuropathy due to chemotherapy  THERAPY DIAG:  Muscle weakness (generalized)  Peripheral neuropathy due to chemotherapy [facility])  Rationale for Evaluation and Treatment: Rehabilitation  ONSET DATE: Patient reports neuropathy for several years since breast cancer diagnosis in 2018  SUBJECTIVE:   SUBJECTIVE STATEMENT: Progressing orthotic wear 1st day wearing orthotic to work. RT hip hurts and left heel. Doing ex and stretches- varies ex  and stretches  PERTINENT HISTORY: personal history of triple positive breast cancer at age 14 and family history of ovarian cancer, BRCA negative on genetic testing Vaginal atrophy Malignant neoplasm of upper-inner quadrant of right breast in female, estrogen receptor positive  PAIN:  Are you having pain? Yes: NPRS scale: 4/10 Pain location: bilateral feet Pain description: tingling after sitting for extended periods, throbbing, sharp/stabbing pains Aggravating factors: sitting, walking, standing/cooking more than 20 minutes, squatting  Relieving factors: topical CBD, ice socks, topical ointments  PRECAUTIONS: None  WEIGHT BEARING RESTRICTIONS: No  FALLS:  Has patient fallen in last 6 months? No  LIVING ENVIRONMENT: Lives with: lives with their spouse Lives in: House/apartment Stairs: Yes: External: 5 steps; on left going up Has following equipment at home: None  OCCUPATION: Consulting civil engineer  PLOF: Independent  PATIENT GOALS: walk without pain  NEXT MD VISIT:   OBJECTIVE:   DIAGNOSTIC FINDINGS: TTP bilateral feet and plantar fascia. Decreased joint mobility in MTPs and talocrural joint. Decreased ankle mobility limiting ability to squat, bend, lift, and with decreased activity tolerance.   PATIENT SURVEYS:  LEFS administer at next session  COGNITION: Overall cognitive status: Within functional limits for tasks assessed     SENSATION: Light touch: decreased touch on left LE vs left   POSTURE: decreased lumbar lordosis and posterior pelvic tilt  PALPATION: Significant TTP bilateral plantar fascia, gastroc, anterior tib, posterior tib, foot intrinsics  LOWER EXTREMITY ROM:  Active ROM Right eval Left eval  Hip flexion  Hip extension    Hip abduction    Hip adduction    Hip internal rotation    Hip external rotation    Knee flexion    Knee extension    Ankle dorsiflexion -4 6  Ankle plantarflexion 47 51  Ankle inversion 40 42  Ankle eversion 11 17    (Blank rows = not tested)  LOWER EXTREMITY MMT:  MMT Right eval Left eval  Hip flexion    Hip extension    Hip abduction    Hip adduction    Hip internal rotation    Hip external rotation    Knee flexion    Knee extension    Ankle dorsiflexion    Ankle plantarflexion    Ankle inversion    Ankle eversion     (Blank rows = not tested)  LOWER EXTREMITY SPECIAL TESTS:  Ankle special tests: Tibial torsion test: negative, Talar tilt test: negative, and Thompson's test: negative  FUNCTIONAL TESTS:  Timed up and go (TUG): 10.98  GAIT: Distance walked: 25 Assistive device utilized: None Level of assistance: Complete Independence Comments: scissoring and decreased stride length bilaterally   TODAY'S TREATMENT:                                                                                                                              DATE:   10/30/22 Nustep L 5 43mn Resisted gait 4 way 30# 4 x each way 6 inch box step over 10 x then up and down 10x each leg Leg press 40# 10 reps toes up, 30# feet ER 10x,IR 10x Calf raises on leg press 30# 2 sets 10 Seated knee flexion, 20#, 2 x 12 reps Seated knee ext, 10# 6x, dropped to 5# 10 x STS from mat with green tband at thighs maintaining abd 2 sets 10 Standing on upside down BOSU-Ant/post weight shifts, lateral weight shifts, tandem stanceoccassional UE support. Static stand 2 x 30 sec Black bar DF and PF 15 x each    10/21/22 NuStep L5 x 6 minutes. Gait assessment with new orthotics. Feet appear to be in mid alignment Standing on upside down BOSU-Ant/post weight shifts, lateral weight shifts, no UE support. Static stand 3 x 30 sec Seated knee flexion, 20#, 2 x 10 reps Seated knee ext, 5#, 2 x 10 reps Supine with legs over physioball- bridge with pelvic tilt 10 reps Supine clamshells against G tband, 2 x 10 reps Standing B side step, 2 x 10 reps with G tband.  10/14/22 NuStep L5 x 6 minutes  Sit to stand with feet on air  ex, holding 1# ball, 10 reps with OHP, then repeat with chest press Seated B knee flexion, 20#, 2 x 10 reps Seated B knee ext, 5#, 2 x 10 reps Standing on upside down BOSU- lateral weight shifts, forward and back, then circular weight shifts in each, BUE support, 10 each  B heel raises and stretches with knee straight and  slightly flexed, 10 reps Standing hip abd against Red Tband resistance 10 reps each, then 5 more each  10/07/22 NuStep L5 x 6 minutes Seated knee flex, 20#, 2 x 10 B LE. Seated knee ext 5#, B, 2 x 10 reps Sit to stand on Air ex, with Red Tband resistance at knees for abd, 10 reps B side stepping against Red tband resistance at knees, 4 x 5 steps each way Standing shoulder ext, rows, 10# 2 x 10 Shoulder ER against G tband, 2 x 10 reps Heel raises from the floor with UUE support, 2 x 10 reps Seated ankle eversion against yellow Tband, 2 x 10 reps Dorsalis ant stretch, sitting on feet.   10/01/22 Bike L3 x 6 minutes Supine clamshells against G tband x 10 Bridge with hip abd against G tband x 10 STM with tennis ball, B calves in sit Stanidng wall stretch for gastroc and soleus B  09/25/2022    PATIENT EDUCATION:  Education details: Discussed objective findings. Discussed POC and HEP Person educated: Patient Education method: Explanation, Demonstration, and Handouts Education comprehension: verbalized understanding, returned demonstration, and needs further education  HOME EXERCISE PROGRAM: Access Code: [email or web address] URL: [email or web address] Date: 09/25/2022 Prepared by: Rochel Brome Diy   ASSESSMENT:  CLINICAL IMPRESSION: Pt states new orthotic are helping.Progressed strength and stabilization ex for LEs OBJECTIVE IMPAIRMENTS: Abnormal gait, decreased balance, difficulty walking, decreased ROM, decreased strength, and pain.   ACTIVITY LIMITATIONS: carrying, lifting, bending, standing, squatting, and caring for others  PARTICIPATION  LIMITATIONS: community activity and occupation  PERSONAL FACTORS: 1-2 comorbidities: history of cancer and chronicity of symptoms  are also affecting patient's functional outcome.   REHAB POTENTIAL: Fair due to chemo and chronicity of symptoms/neuropathy  CLINICAL DECISION MAKING: Stable/uncomplicated  EVALUATION COMPLEXITY: Low   GOALS: Goals reviewed with patient? No  SHORT TERM GOALS: Target date: 10/23/2022   Patient will be independent and compliant with HEP to improve carryover of sessions Baseline: HEP provided. See above Goal status: met  2.  Patient will be able to demonstrate DF of right ankle to neutral in order to improve gait and mobility Baseline: lacking 4 degrees of neutral on right  Goal status: ongoing  3.  Patient will be able to walk greater than 30 minutes without pain in order to improve work tolerance Baseline: Unable to walk greater than 20 minutes without pain at this time Goal status: INITIAL  4.  Patient will be able to squat and lift 15lbs from floor without increase in pain to perform housechores and take care of animals Baseline: squats with significantly rounded lumbar spine and is unable to lift greater than 10lbs without difficulty Goal status: INITIAL    LONG TERM GOALS: Target date: 11/20/2022    Patient will be able ambulate greater than 1 hour to improve tolerance to work demands and ease with house chores Baseline: unable to stand/walk greater than 20 minutes  Goal status: ongoing  2.  Patient will be able to demonstrate proper lifting mechanics to lift and squat greater than 30lbs from floor for work tasks and house chores Baseline:  Goal status: INITIAL   PLAN:  PT FREQUENCY: 1x/week  PT DURATION: 8 weeks  PLANNED INTERVENTIONS: Therapeutic exercises, Therapeutic activity, Neuromuscular re-education, Balance training, Gait training, Patient/Family education, Self Care, Joint mobilization, Joint manipulation, Stair training, Dry  Needling, Electrical stimulation, Cryotherapy, Moist heat, Taping, Vasopneumatic device, Traction, Manual therapy, and Re-evaluation  PLAN FOR NEXT SESSION: Continue PT services  GOALS NEED ASSESSED  Terren Jandreau,ANGIE, PTA 10/30/2022, 11:00 AM  [facility] at [address]. [place], [place], [address] Phone: [phone]   Fax:  [phone]

## 2022-11-04 ENCOUNTER — Ambulatory Visit: Payer: BC Managed Care – PPO | Admitting: Physical Therapy

## 2022-11-04 ENCOUNTER — Encounter: Payer: Self-pay | Admitting: Physical Therapy

## 2022-11-04 DIAGNOSIS — M62838 Other muscle spasm: Secondary | ICD-10-CM

## 2022-11-04 DIAGNOSIS — T451X5A Adverse effect of antineoplastic and immunosuppressive drugs, initial encounter: Secondary | ICD-10-CM

## 2022-11-04 DIAGNOSIS — R279 Unspecified lack of coordination: Secondary | ICD-10-CM

## 2022-11-04 DIAGNOSIS — M6281 Muscle weakness (generalized): Secondary | ICD-10-CM

## 2022-11-04 DIAGNOSIS — M25572 Pain in left ankle and joints of left foot: Secondary | ICD-10-CM

## 2022-11-04 DIAGNOSIS — R293 Abnormal posture: Secondary | ICD-10-CM

## 2022-11-04 DIAGNOSIS — R262 Difficulty in walking, not elsewhere classified: Secondary | ICD-10-CM

## 2022-11-04 NOTE — Therapy (Signed)
OUTPATIENT PHYSICAL THERAPY LOWER EXTREMITY   Progress Note Reporting Period 07/14/23 to 10/01/22  See note below for Objective Data and Assessment of Progress/Goals.     Patient Name: Hannah Woodard MRN: [id number] DOB:[birth year], 52 y.o., female Today's Date: 11/04/2022  END OF SESSION:  PT End of Session - 11/04/22 1552     Visit Number 28    Date for PT Re-Evaluation 11/20/22    PT Start Time 3295    PT Stop Time 1628    PT Time Calculation (min) 38 min    Activity Tolerance Patient tolerated treatment well    Behavior During Therapy [facility] for tasks assessed/performed              Past Medical History:  Diagnosis Date   Breast cancer ([place])    Family history of ovarian cancer 09/02/2017   Family history of prostate cancer in father 09/02/2017   Family history of uterine cancer 09/02/2017   Plantar fasciitis    Past Surgical History:  Procedure Laterality Date   PORT-A-CATH REMOVAL Left 2020   right lumpectomy, sentinel node biopsy, and bilateral mammoplasty  10/2017   [facility]   Patient Active Problem List   Diagnosis Date Noted   Overweight (BMI 25.0-29.9) 12/31/2020   Hyperlipidemia 12/30/2020   Neuropathy due to chemotherapeutic drug ([place]) 12/31/2017   Port-A-Cath in place 11/12/2017   Genetic testing 09/15/2017   Malignant neoplasm of upper-inner quadrant of right breast in female, estrogen receptor positive ([place]) 08/27/2017   Vitamin D deficiency 12/09/2015   Medication management 12/09/2015   Dense breast tissue 12/09/2015    PCP: Horald Pollen  REFERRING PROVIDER: Leeroy Cha  REFERRING DIAG: Neuropathy due to chemotherapy  THERAPY DIAG:  Muscle weakness (generalized)  Peripheral neuropathy due to chemotherapy ([facility])  Abnormal posture  Other muscle spasm  Pain in left ankle and joints of left foot  Unspecified lack of coordination  Difficulty in walking, not elsewhere classified  Rationale for Evaluation and Treatment:  Rehabilitation  ONSET DATE: Patient reports neuropathy for several years since breast cancer diagnosis in 2018  SUBJECTIVE:   SUBJECTIVE STATEMENT: Progressing orthotic wear 1st day wearing orthotic to work. RT hip hurts and left heel. Doing ex and stretches- varies ex and stretches  PERTINENT HISTORY: personal history of triple positive breast cancer at age 48 and family history of ovarian cancer, BRCA negative on genetic testing Vaginal atrophy Malignant neoplasm of upper-inner quadrant of right breast in female, estrogen receptor positive  PAIN:  Are you having pain? Yes: NPRS scale: 4/10 Pain location: bilateral feet Pain description: tingling after sitting for extended periods, throbbing, sharp/stabbing pains Aggravating factors: sitting, walking, standing/cooking more than 20 minutes, squatting  Relieving factors: topical CBD, ice socks, topical ointments  PRECAUTIONS: None  WEIGHT BEARING RESTRICTIONS: No  FALLS:  Has patient fallen in last 6 months? No  LIVING ENVIRONMENT: Lives with: lives with their spouse Lives in: House/apartment Stairs: Yes: External: 5 steps; on left going up Has following equipment at home: None  OCCUPATION: Consulting civil engineer  PLOF: Independent  PATIENT GOALS: walk without pain  NEXT MD VISIT:   OBJECTIVE:   DIAGNOSTIC FINDINGS: TTP bilateral feet and plantar fascia. Decreased joint mobility in MTPs and talocrural joint. Decreased ankle mobility limiting ability to squat, bend, lift, and with decreased activity tolerance.   PATIENT SURVEYS:  LEFS administer at next session  COGNITION: Overall cognitive status: Within functional limits for tasks assessed     SENSATION: Light touch: decreased touch on left  LE vs left   POSTURE: decreased lumbar lordosis and posterior pelvic tilt  PALPATION: Significant TTP bilateral plantar fascia, gastroc, anterior tib, posterior tib, foot intrinsics  LOWER EXTREMITY ROM:  Active ROM  Right eval Left eval  Hip flexion    Hip extension    Hip abduction    Hip adduction    Hip internal rotation    Hip external rotation    Knee flexion    Knee extension    Ankle dorsiflexion -4 6  Ankle plantarflexion 47 51  Ankle inversion 40 42  Ankle eversion 11 17   (Blank rows = not tested)  LOWER EXTREMITY MMT:  MMT Right eval Left eval  Hip flexion    Hip extension    Hip abduction    Hip adduction    Hip internal rotation    Hip external rotation    Knee flexion    Knee extension    Ankle dorsiflexion    Ankle plantarflexion    Ankle inversion    Ankle eversion     (Blank rows = not tested)  LOWER EXTREMITY SPECIAL TESTS:  Ankle special tests: Tibial torsion test: negative, Talar tilt test: negative, and Thompson's test: negative  FUNCTIONAL TESTS:  Timed up and go (TUG): 10.98  GAIT: Distance walked: 25 Assistive device utilized: None Level of assistance: Complete Independence Comments: scissoring and decreased stride length bilaterally   TODAY'S TREATMENT:                                                                                                                              DATE:  11/04/22 NuStep x 6 minutes Sit to stand with g tband around knees, standing on Air es, 2 x 10 Education for proper lifting-practiced lifitng 5# from 12" height to upright stance x 5, then tried dead lift, ut only able to perform 3 reps. Knees were popping throughout B side step against G tband resistance 3 x 6 steps each direction  10/30/22 Nustep L 5 16mn Resisted gait 4 way 30# 4 x each way 6 inch box step over 10 x then up and down 10x each leg Leg press 40# 10 reps toes up, 30# feet ER 10x,IR 10x Calf raises on leg press 30# 2 sets 10 Seated knee flexion, 20#, 2 x 12 reps Seated knee ext, 10# 6x, dropped to 5# 10 x STS from mat with green tband at thighs maintaining abd 2 sets 10 Standing on upside down BOSU-Ant/post weight shifts, lateral weight shifts,  tandem stanceoccassional UE support. Static stand 2 x 30 sec Black bar DF and PF 15 x each    10/21/22 NuStep L5 x 6 minutes. Gait assessment with new orthotics. Feet appear to be in mid alignment Standing on upside down BOSU-Ant/post weight shifts, lateral weight shifts, no UE support. Static stand 3 x 30 sec Seated knee flexion, 20#, 2 x 10 reps Seated knee ext, 5#, 2 x 10 reps Supine with  legs over physioball- bridge with pelvic tilt 10 reps Supine clamshells against G tband, 2 x 10 reps Standing B side step, 2 x 10 reps with G tband.  10/14/22 NuStep L5 x 6 minutes  Sit to stand with feet on air ex, holding 1# ball, 10 reps with OHP, then repeat with chest press Seated B knee flexion, 20#, 2 x 10 reps Seated B knee ext, 5#, 2 x 10 reps Standing on upside down BOSU- lateral weight shifts, forward and back, then circular weight shifts in each, BUE support, 10 each  B heel raises and stretches with knee straight and slightly flexed, 10 reps Standing hip abd against Red Tband resistance 10 reps each, then 5 more each  10/07/22 NuStep L5 x 6 minutes Seated knee flex, 20#, 2 x 10 B LE. Seated knee ext 5#, B, 2 x 10 reps Sit to stand on Air ex, with Red Tband resistance at knees for abd, 10 reps B side stepping against Red tband resistance at knees, 4 x 5 steps each way Standing shoulder ext, rows, 10# 2 x 10 Shoulder ER against G tband, 2 x 10 reps Heel raises from the floor with UUE support, 2 x 10 reps Seated ankle eversion against yellow Tband, 2 x 10 reps Dorsalis ant stretch, sitting on feet.   10/01/22 Bike L3 x 6 minutes Supine clamshells against G tband x 10 Bridge with hip abd against G tband x 10 STM with tennis ball, B calves in sit Stanidng wall stretch for gastroc and soleus B  09/25/2022    PATIENT EDUCATION:  Education details: Discussed objective findings. Discussed POC and HEP Person educated: Patient Education method: Explanation, Demonstration, and  Handouts Education comprehension: verbalized understanding, returned demonstration, and needs further education  HOME EXERCISE PROGRAM: Access Code: [email or web address] URL: [email or web address] Date: 09/25/2022 Prepared by: Rochel Brome Diy   ASSESSMENT:  CLINICAL IMPRESSION: Pt states she is wearing the orthotics at all times and they seem to help. Still has some pain in L post/plantar heel. Provided education for proper lifting techniques from floor, with practice. Also discussed modification to how much she needs to lift and LE stretching, frequent re-positioning when she has to sit on the floor to decrease stiffness and strain upon trying to stand again. OBJECTIVE IMPAIRMENTS: Abnormal gait, decreased balance, difficulty walking, decreased ROM, decreased strength, and pain.   ACTIVITY LIMITATIONS: carrying, lifting, bending, standing, squatting, and caring for others  PARTICIPATION LIMITATIONS: community activity and occupation  PERSONAL FACTORS: 1-2 comorbidities: history of cancer and chronicity of symptoms  are also affecting patient's functional outcome.   REHAB POTENTIAL: Fair due to chemo and chronicity of symptoms/neuropathy  CLINICAL DECISION MAKING: Stable/uncomplicated  EVALUATION COMPLEXITY: Low   GOALS: Goals reviewed with patient? No  SHORT TERM GOALS: Target date: 10/23/2022   Patient will be independent and compliant with HEP to improve carryover of sessions Baseline: HEP provided. See above Goal status: met  2.  Patient will be able to demonstrate DF of right ankle to neutral in order to improve gait and mobility Baseline: lacking 4 degrees of neutral on right  Goal status: ongoing, 11/04/22- met  3.  Patient will be able to walk greater than 30 minutes without pain in order to improve work tolerance Baseline: Unable to walk greater than 20 minutes without pain at this time Goal status: 11/04/22 can walk x 15 minutes with mild pain, ongoing  4.   Patient will be able to squat and lift 15lbs from floor without  increase in pain to perform housechores and take care of animals Baseline: squats with significantly rounded lumbar spine and is unable to lift greater than 10lbs without difficulty Goal status: 11/04/22-Lifted 5# from 12" height x 5 ongoing    LONG TERM GOALS: Target date: 11/20/2022    Patient will be able ambulate greater than 1 hour to improve tolerance to work demands and ease with house chores Baseline: unable to stand/walk greater than 20 minutes  Goal status: 11/04/22-Still having inconsistent pain during the day.  2.  Patient will be able to demonstrate proper lifting mechanics to lift and squat greater than 30lbs from floor for work tasks and house chores Baseline:  Goal status: 11/04/22-ongoing   PLAN:  PT FREQUENCY: 1x/week  PT DURATION: 8 weeks  PLANNED INTERVENTIONS: Therapeutic exercises, Therapeutic activity, Neuromuscular re-education, Balance training, Gait training, Patient/Family education, Self Care, Joint mobilization, Joint manipulation, Stair training, Dry Needling, Electrical stimulation, Cryotherapy, Moist heat, Taping, Vasopneumatic device, Traction, Manual therapy, and Re-evaluation  PLAN FOR NEXT SESSION: Continue PT services HEP-update with calf strength and foot stretch , resisted wide stepping.   Marcelina Morel, DPT 11/04/2022, 4:32 PM  [facility] at [address]. [place], [place], [address] Phone: [phone]   Fax:  [phone]

## 2022-11-11 ENCOUNTER — Ambulatory Visit: Payer: BC Managed Care – PPO | Admitting: Physical Therapy

## 2022-11-11 ENCOUNTER — Encounter: Payer: Self-pay | Admitting: Physical Therapy

## 2022-11-11 DIAGNOSIS — M25572 Pain in left ankle and joints of left foot: Secondary | ICD-10-CM

## 2022-11-11 DIAGNOSIS — G620 Drug-induced polyneuropathy: Secondary | ICD-10-CM

## 2022-11-11 DIAGNOSIS — M6281 Muscle weakness (generalized): Secondary | ICD-10-CM | POA: Diagnosis not present

## 2022-11-11 DIAGNOSIS — M62838 Other muscle spasm: Secondary | ICD-10-CM

## 2022-11-11 DIAGNOSIS — R293 Abnormal posture: Secondary | ICD-10-CM

## 2022-11-11 DIAGNOSIS — R262 Difficulty in walking, not elsewhere classified: Secondary | ICD-10-CM

## 2022-11-11 DIAGNOSIS — R279 Unspecified lack of coordination: Secondary | ICD-10-CM

## 2022-11-11 NOTE — Therapy (Unsigned)
OUTPATIENT PHYSICAL THERAPY FEMALE PELVIC TREATMENT   Patient Name: Hannah Woodard MRN: [id number] DOB:09-Jan-1971, 52 y.o., female Today's Date: 11/12/2022   PT End of Session - 11/12/22 1150     Visit Number 30    Date for PT Re-Evaluation 11/20/22    Authorization Type BC[employer]    PT Start Time 1148    PT Stop Time 1228    PT Time Calculation (min) 40 min    Activity Tolerance Patient tolerated treatment well    Behavior During Therapy [facility] for tasks assessed/performed                   Past Medical History:  Diagnosis Date   Breast cancer ([place])    Family history of ovarian cancer 09/02/2017   Family history of prostate cancer in father 09/02/2017   Family history of uterine cancer 09/02/2017   Plantar fasciitis    Past Surgical History:  Procedure Laterality Date   PORT-A-CATH REMOVAL Left 2020   right lumpectomy, sentinel node biopsy, and bilateral mammoplasty  10/2017   [facility]   Patient Active Problem List   Diagnosis Date Noted   Overweight (BMI 25.0-29.9) 12/31/2020   Hyperlipidemia 12/30/2020   Neuropathy due to chemotherapeutic drug ([place]) 12/31/2017   Port-A-Cath in place 11/12/2017   Genetic testing 09/15/2017   Malignant neoplasm of upper-inner quadrant of right breast in female, estrogen receptor positive ([place]) 08/27/2017   Vitamin D deficiency 12/09/2015   Medication management 12/09/2015   Dense breast tissue 12/09/2015    PCP: Hayden Rasmussen, MD  REFERRING PROVIDER: Dorothyann Gibbs, NP  REFERRING DIAG:  R35.0 (ICD-10-CM) - Urinary frequency N94.10 (ICD-10-CM) - Dyspareunia in female T66.6X5A (ICD-10-CM) - Adverse effect of antigonadotrophins, antiestrogens, antiandrogens, not elsewhere classified, initial encounter  THERAPY DIAG:  Muscle weakness (generalized)  Abnormal posture  Other muscle spasm  Unspecified lack of coordination  Rationale for Evaluation and Treatment Rehabilitation  ONSET DATE: years  SUBJECTIVE:                                                                                                                                                                                            SUBJECTIVE STATEMENT: Patient was able to have intercourse but was not very comfortable for me, but 3-4/10 at times.    Fluid intake: Yes: 60 oz water, tea sometimes but not regular       PAIN:  Are you having pain? No  NPRS scale: 3-4/10 Rt>Lt inner thigh Pain location:  intercourse, internally vaginally.  Pain reported in feet and thighs   PRECAUTIONS: None  WEIGHT BEARING RESTRICTIONS No  FALLS:  Has patient fallen in last 6 months? 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  PLOF: Independent  PATIENT GOALS to have less pain, more regular voids  PERTINENT HISTORY:  personal history of triple positive breast cancer at age 63 and family history of ovarian cancer, BRCA negative on genetic testing Vaginal atrophy Malignant neoplasm of upper-inner quadrant of right breast in female, estrogen receptor positive   Sexual abuse: No  BOWEL MOVEMENT Pain with bowel movement: No Type of bowel movement:Type (Bristol Stool Scale) 2-3, Frequency daily, and Strain Yes but this has improved since starting using step stool in bathroom Fully empty rectum: No Leakage: No Pads: No Fiber supplement: Yes:    URINATION Pain with urination: No sometimes, rarely will have a burning Fully empty bladder: No Stream: Strong and Weak Urgency: Yes: consistently Frequency: 2 hours sometimes sooner, usually gets up at night 1x Leakage: Coughing and Sneezing Pads: No  INTERCOURSE Pain with intercourse: Initial Penetration, During Penetration, After Intercourse, and Pain Interrupts Intercourse Ability to have vaginal penetration:  Yes:   Climax: not painful but pain prevents climax sometimes Marinoff Scale:  2/3  PREGNANCY Vaginal deliveries 0 Tearing No C-section deliveries 0 Currently pregnant No  PROLAPSE None    OBJECTIVE:   DIAGNOSTIC FINDINGS:   COGNITION:  Overall cognitive status: Within functional limits for tasks assessed     SENSATION:  Light touch: Appears intact   Proprioception: Appears intact  MUSCLE LENGTH: Bil hamstring and adductor limited by 25%                POSTURE: rounded shoulders and forward head    LUMBARAROM/PROM  A/PROM A/PROM  eval  Flexion Limited by 25%  Extension WFL  Right lateral flexion Limited by 25%  Left lateral flexion Limited by 25%  Right rotation WFL  Left rotation WFL   (Blank rows = not tested)  LOWER EXTREMITY ROM:  Bil WFL LOWER EXTREMITY MMT:   Bil hips 4/5 grossly, knees and ankles 5/5   PALPATION:   General  no TTP, mild fascial restrictions noted in lower abdominal quadrants and over bladder                External Perineal Exam no TTP, dryness noted externally                             Internal Pelvic Floor no TTP however increased tension noted throughout  Patient confirms identification and approves PT to assess internal pelvic floor and treatment Yes  PELVIC MMT:   MMT eval 06/03/22  07/06/22  07/20/22  10/02/22   Vaginal 1/5 with max cues and quick release; without cues 0/5; 1s; 1 rep 1/5 initially but with quick release 2/5.   2/5 with minimal cues 2/5 - however with palpation of superficial muscles pt reported pain with palpation at bulbocavernosus bil and had several small trigger points.  3/5  Internal Anal Sphincter       External Anal Sphincter       Puborectalis       Diastasis Recti       (Blank rows = not tested)        TONE: Mildly increased  PROLAPSE: Not seen in hooklying   TODAY'S TREATMENT   11/12/22: Manual to calves, adductors, hip flexors, gluteals - all bil Trigger Point Dry-Needling  Treatment instructions: Expect mild to  moderate muscle soreness. S/S of  pneumothorax if dry needled over a lung field, and to seek immediate medical attention should they occur. Patient verbalized understanding of these instructions and education.  Patient Consent Given: Yes Education handout provided: Yes Muscles treated: calves, hip flexors, gluteals, adductors Electrical stimulation performed: No Parameters: N/A Treatment response/outcome: increased soft tissue and twitch          PATIENT EDUCATION:  Education details: [id number], urge drill  Person educated: Patient Education method: Explanation, Demonstration, Tactile cues, Verbal cues, and Handouts Education comprehension: verbalized understanding and returned demonstration   HOME EXERCISE PROGRAM: [email or web address]  ASSESSMENT:  CLINICAL IMPRESSION: Pt has made some progress with the dilators.  Still discomfort and a lot of fascial restrictions.  Today's session focused on dry needling and manual techniques to release tension in muscles that have fascial connections to pelvic floor.  Pt is recommended to continue skilled PT to work towards functional goals  OBJECTIVE IMPAIRMENTS decreased coordination, decreased endurance, decreased mobility, decreased strength, increased fascial restrictions, increased muscle spasms, impaired flexibility, improper body mechanics, postural dysfunction, and pain.   ACTIVITY LIMITATIONS continence, locomotion level, and intercourse  PARTICIPATION LIMITATIONS: interpersonal relationship and community activity  PERSONAL FACTORS Past/current experiences, Time since onset of injury/illness/exacerbation, and 1 comorbidity: medical history  are also affecting patient's functional outcome.   REHAB POTENTIAL: Good  CLINICAL DECISION MAKING: Stable/uncomplicated  EVALUATION COMPLEXITY: Low   GOALS: Goals reviewed with patient? Yes  SHORT TERM GOALS: Target date: 05/26/2022  Pt to be I with HEP.  Baseline: Goal status: MET  2.  Pt to be I with use of feminine  moisturizers and lubricants as needed for improved vaginal health and decreased pain with penetration.  Baseline:  Goal status: MET  3.  Pt to demonstrated improved coordination with pelvic floor and breathing mechanics with body weight squats without leakage or pain for improved pelvic stability.  Baseline:  Goal status: MET   LONG TERM GOALS: Target date: 11/27/22  Pt to be I with advanced HEP.  Baseline:  Goal status: MET  2.  Pt to demonstrate at least 5/5 bil hip strength for improved pelvic stability and functional squats without leakage.  Baseline:  Goal status: MET  3. Pt to demonstrate at least 3/5 pelvic floor strength for improved pelvic stability and decreased strain at pelvic floor/ decrease leakage.  Baseline:  Goal status: MET  4.  Pt to report improved time between bladder voids to at least 2.5 hours for improved QOL with decreased urinary frequency.   Baseline:  Goal status: met  5.  Pt to report no more than 2/10 pain with vaginal penetration for improved QOL and decreased pain with medical procedures. Baseline:  Goal status: on going - has been improving but not consistently 2 or less/10. At worse 5/10 without extra time for penetration but with extra time and lubrication 3-4/10.     PLAN: PT FREQUENCY: 1-2x/week  PT DURATION: other: 8 weeks     PLANNED INTERVENTIONS: Therapeutic exercises, Therapeutic activity, Neuromuscular re-education, Patient/Family education, Self Care, Joint mobilization, Aquatic Therapy, Dry Needling, Spinal mobilization, Cryotherapy, Moist heat, scar mobilization, Taping, Biofeedback, and Manual therapy  PLAN FOR NEXT SESSION: continue internal manual release and lumbar fascial release,f/u on oh nut and dry needling response  Gustavus Bryant, PT, DPT 11/12/22 11:53 AM     [facility] [address] [place], [place] [address] Phone # [phone] Fax [phone]

## 2022-11-11 NOTE — Therapy (Signed)
OUTPATIENT PHYSICAL THERAPY LOWER EXTREMITY   Progress Note Reporting Period 07/14/23 to 10/01/22  See note below for Objective Data and Assessment of Progress/Goals.     Patient Name: Hannah Woodard MRN: [id number] DOB:April 23, 1971, 52 y.o., female Today's Date: 11/11/2022  END OF SESSION:  PT End of Session - 11/11/22 1602     Visit Number 29    Date for PT Re-Evaluation 11/20/22    PT Start Time 1552    PT Stop Time 1627    PT Time Calculation (min) 35 min    Activity Tolerance Patient tolerated treatment well    Behavior During Therapy [facility] for tasks assessed/performed               Past Medical History:  Diagnosis Date   Breast cancer ([place])    Family history of ovarian cancer 09/02/2017   Family history of prostate cancer in father 09/02/2017   Family history of uterine cancer 09/02/2017   Plantar fasciitis    Past Surgical History:  Procedure Laterality Date   PORT-A-CATH REMOVAL Left 2020   right lumpectomy, sentinel node biopsy, and bilateral mammoplasty  10/2017   [facility]   Patient Active Problem List   Diagnosis Date Noted   Overweight (BMI 25.0-29.9) 12/31/2020   Hyperlipidemia 12/30/2020   Neuropathy due to chemotherapeutic drug ([place]) 12/31/2017   Port-A-Cath in place 11/12/2017   Genetic testing 09/15/2017   Malignant neoplasm of upper-inner quadrant of right breast in female, estrogen receptor positive ([place]) 08/27/2017   Vitamin D deficiency 12/09/2015   Medication management 12/09/2015   Dense breast tissue 12/09/2015    PCP: Horald Pollen  REFERRING PROVIDER: Leeroy Cha  REFERRING DIAG: Neuropathy due to chemotherapy  THERAPY DIAG:  Muscle weakness (generalized)  Peripheral neuropathy due to chemotherapy ([facility])  Abnormal posture  Other muscle spasm  Pain in left ankle and joints of left foot  Unspecified lack of coordination  Difficulty in walking, not elsewhere classified  Rationale for Evaluation and Treatment:  Rehabilitation  ONSET DATE: Patient reports neuropathy for several years since breast cancer diagnosis in 2018  SUBJECTIVE:   SUBJECTIVE STATEMENT: Patient reports that her dog chewed on her new shoes and so she has had to wear sandals at work which are not providing as much support.  PERTINENT HISTORY: personal history of triple positive breast cancer at age 13 and family history of ovarian cancer, BRCA negative on genetic testing Vaginal atrophy Malignant neoplasm of upper-inner quadrant of right breast in female, estrogen receptor positive  PAIN:  Are you having pain? Yes: NPRS scale: 4/10 Pain location: bilateral feet Pain description: tingling after sitting for extended periods, throbbing, sharp/stabbing pains Aggravating factors: sitting, walking, standing/cooking more than 20 minutes, squatting  Relieving factors: topical CBD, ice socks, topical ointments  PRECAUTIONS: None  WEIGHT BEARING RESTRICTIONS: No  FALLS:  Has patient fallen in last 6 months? No  LIVING ENVIRONMENT: Lives with: lives with their spouse Lives in: House/apartment Stairs: Yes: External: 5 steps; on left going up Has following equipment at home: None  OCCUPATION: Consulting civil engineer  PLOF: Independent  PATIENT GOALS: walk without pain  NEXT MD VISIT:   OBJECTIVE:   DIAGNOSTIC FINDINGS: TTP bilateral feet and plantar fascia. Decreased joint mobility in MTPs and talocrural joint. Decreased ankle mobility limiting ability to squat, bend, lift, and with decreased activity tolerance.   PATIENT SURVEYS:  LEFS administer at next session  COGNITION: Overall cognitive status: Within functional limits for tasks assessed     SENSATION: Light  touch: decreased touch on left LE vs left   POSTURE: decreased lumbar lordosis and posterior pelvic tilt  PALPATION: Significant TTP bilateral plantar fascia, gastroc, anterior tib, posterior tib, foot intrinsics  LOWER EXTREMITY ROM:  Active ROM  Right eval Left eval  Hip flexion    Hip extension    Hip abduction    Hip adduction    Hip internal rotation    Hip external rotation    Knee flexion    Knee extension    Ankle dorsiflexion -4 6  Ankle plantarflexion 47 51  Ankle inversion 40 42  Ankle eversion 11 17   (Blank rows = not tested)  LOWER EXTREMITY MMT:  MMT Right eval Left eval  Hip flexion    Hip extension    Hip abduction    Hip adduction    Hip internal rotation    Hip external rotation    Knee flexion    Knee extension    Ankle dorsiflexion    Ankle plantarflexion    Ankle inversion    Ankle eversion     (Blank rows = not tested)  LOWER EXTREMITY SPECIAL TESTS:  Ankle special tests: Tibial torsion test: negative, Talar tilt test: negative, and Thompson's test: negative  FUNCTIONAL TESTS:  Timed up and go (TUG): 10.98  GAIT: Distance walked: 25 Assistive device utilized: None Level of assistance: Complete Independence Comments: scissoring and decreased stride length bilaterally   TODAY'S TREATMENT:                                                                                                                              DATE:  11/11/22 NuStep L5 x 6 Education for use of foam roller, lying with roller perpendicular along spine, then moved all the way down with low back, gluts, ITB, quads, and calves. Patient returned demo of each, will practice at home.  11/04/22 NuStep x 6 minutes Sit to stand with g tband around knees, standing on Air es, 2 x 10 Education for proper lifting-practiced lifitng 5# from 12" height to upright stance x 5, then tried dead lift, ut only able to perform 3 reps. Knees were popping throughout B side step against G tband resistance 3 x 6 steps each direction  10/30/22 Nustep L 5 48mn Resisted gait 4 way 30# 4 x each way 6 inch box step over 10 x then up and down 10x each leg Leg press 40# 10 reps toes up, 30# feet ER 10x,IR 10x Calf raises on leg press 30# 2 sets  10 Seated knee flexion, 20#, 2 x 12 reps Seated knee ext, 10# 6x, dropped to 5# 10 x STS from mat with green tband at thighs maintaining abd 2 sets 10 Standing on upside down BOSU-Ant/post weight shifts, lateral weight shifts, tandem stanceoccassional UE support. Static stand 2 x 30 sec Black bar DF and PF 15 x each    10/21/22 NuStep L5 x 6 minutes. Gait assessment with  new orthotics. Feet appear to be in mid alignment Standing on upside down BOSU-Ant/post weight shifts, lateral weight shifts, no UE support. Static stand 3 x 30 sec Seated knee flexion, 20#, 2 x 10 reps Seated knee ext, 5#, 2 x 10 reps Supine with legs over physioball- bridge with pelvic tilt 10 reps Supine clamshells against G tband, 2 x 10 reps Standing B side step, 2 x 10 reps with G tband.  10/14/22 NuStep L5 x 6 minutes  Sit to stand with feet on air ex, holding 1# ball, 10 reps with OHP, then repeat with chest press Seated B knee flexion, 20#, 2 x 10 reps Seated B knee ext, 5#, 2 x 10 reps Standing on upside down BOSU- lateral weight shifts, forward and back, then circular weight shifts in each, BUE support, 10 each  B heel raises and stretches with knee straight and slightly flexed, 10 reps Standing hip abd against Red Tband resistance 10 reps each, then 5 more each  10/07/22 NuStep L5 x 6 minutes Seated knee flex, 20#, 2 x 10 B LE. Seated knee ext 5#, B, 2 x 10 reps Sit to stand on Air ex, with Red Tband resistance at knees for abd, 10 reps B side stepping against Red tband resistance at knees, 4 x 5 steps each way Standing shoulder ext, rows, 10# 2 x 10 Shoulder ER against G tband, 2 x 10 reps Heel raises from the floor with UUE support, 2 x 10 reps Seated ankle eversion against yellow Tband, 2 x 10 reps Dorsalis ant stretch, sitting on feet.   10/01/22 Bike L3 x 6 minutes Supine clamshells against G tband x 10 Bridge with hip abd against G tband x 10 STM with tennis ball, B calves in sit Stanidng  wall stretch for gastroc and soleus B  09/25/2022    PATIENT EDUCATION:  Education details: Discussed objective findings. Discussed POC and HEP Person educated: Patient Education method: Explanation, Demonstration, and Handouts Education comprehension: verbalized understanding, returned demonstration, and needs further education  HOME EXERCISE PROGRAM: Access Code: [email or web address] URL: [email or web address] Date: 09/25/2022 Prepared by: Rochel Brome Diy   ASSESSMENT:  CLINICAL IMPRESSION: Pt states her dog ate one of her new shoes. She has had to wear sandals to work and her feet are sore. The sandals do provide some support-she hopes to get new shoes next week. Treatment focused on using the foam roller for pretty much head to toe STM, demo to patient with return demo back. HEP updated and texted to patient.  OBJECTIVE IMPAIRMENTS: Abnormal gait, decreased balance, difficulty walking, decreased ROM, decreased strength, and pain.   ACTIVITY LIMITATIONS: carrying, lifting, bending, standing, squatting, and caring for others  PARTICIPATION LIMITATIONS: community activity and occupation  PERSONAL FACTORS: 1-2 comorbidities: history of cancer and chronicity of symptoms  are also affecting patient's functional outcome.   REHAB POTENTIAL: Fair due to chemo and chronicity of symptoms/neuropathy  CLINICAL DECISION MAKING: Stable/uncomplicated  EVALUATION COMPLEXITY: Low   GOALS: Goals reviewed with patient? No  SHORT TERM GOALS: Target date: 10/23/2022   Patient will be independent and compliant with HEP to improve carryover of sessions Baseline: HEP provided. See above Goal status: met  2.  Patient will be able to demonstrate DF of right ankle to neutral in order to improve gait and mobility Baseline: lacking 4 degrees of neutral on right  Goal status: ongoing, 11/04/22- met  3.  Patient will be able to walk greater than 30 minutes without pain in order to  improve  work tolerance Baseline: Unable to walk greater than 20 minutes without pain at this time Goal status: 11/04/22 can walk x 15 minutes with mild pain, ongoing  4.  Patient will be able to squat and lift 15lbs from floor without increase in pain to perform housechores and take care of animals Baseline: squats with significantly rounded lumbar spine and is unable to lift greater than 10lbs without difficulty Goal status: 11/04/22-Lifted 5# from 12" height x 5 ongoing    LONG TERM GOALS: Target date: 11/20/2022    Patient will be able ambulate greater than 1 hour to improve tolerance to work demands and ease with house chores Baseline: unable to stand/walk greater than 20 minutes  Goal status: 11/04/22-Still having inconsistent pain during the day.  2.  Patient will be able to demonstrate proper lifting mechanics to lift and squat greater than 30lbs from floor for work tasks and house chores Baseline:  Goal status: 11/04/22-ongoing  PLAN:  PT FREQUENCY: 1x/week  PT DURATION: 8 weeks  PLANNED INTERVENTIONS: Therapeutic exercises, Therapeutic activity, Neuromuscular re-education, Balance training, Gait training, Patient/Family education, Self Care, Joint mobilization, Joint manipulation, Stair training, Dry Needling, Electrical stimulation, Cryotherapy, Moist heat, Taping, Vasopneumatic device, Traction, Manual therapy, and Re-evaluation  PLAN FOR NEXT SESSION: Continue PT services HEP-update with calf strength and foot stretch , resisted wide stepping.   Marcelina Morel, DPT 11/11/2022, 5:22 PM  [facility] [place]. [place], [place], [address] Phone: [phone]   Fax:  [phone]

## 2022-11-12 ENCOUNTER — Ambulatory Visit: Payer: BC Managed Care – PPO | Admitting: Physical Therapy

## 2022-11-12 DIAGNOSIS — M6281 Muscle weakness (generalized): Secondary | ICD-10-CM

## 2022-11-12 DIAGNOSIS — M62838 Other muscle spasm: Secondary | ICD-10-CM

## 2022-11-12 DIAGNOSIS — R279 Unspecified lack of coordination: Secondary | ICD-10-CM

## 2022-11-12 DIAGNOSIS — R293 Abnormal posture: Secondary | ICD-10-CM

## 2022-11-17 ENCOUNTER — Ambulatory Visit: Payer: BC Managed Care – PPO | Admitting: Physical Therapy

## 2022-11-17 DIAGNOSIS — R279 Unspecified lack of coordination: Secondary | ICD-10-CM

## 2022-11-17 DIAGNOSIS — M6281 Muscle weakness (generalized): Secondary | ICD-10-CM | POA: Diagnosis not present

## 2022-11-17 DIAGNOSIS — R293 Abnormal posture: Secondary | ICD-10-CM

## 2022-11-17 DIAGNOSIS — M62838 Other muscle spasm: Secondary | ICD-10-CM

## 2022-11-17 NOTE — Addendum Note (Signed)
Addended by: Su Hoff on: 11/17/2022 04:48 PM   Modules accepted: Orders

## 2022-11-17 NOTE — Therapy (Signed)
OUTPATIENT PHYSICAL THERAPY FEMALE PELVIC TREATMENT   Patient Name: Hannah Woodard MRN: [id number] DOB:[birth year], 52 y.o., female Today's Date: 11/17/2022   PT End of Session - 11/17/22 1550     Visit Number 31    Date for PT Re-Evaluation 11/20/22    Authorization Type BCBS    PT Start Time [id number]    PT Stop Time 1630    PT Time Calculation (min) 40 min    Activity Tolerance Patient tolerated treatment well    Behavior During Therapy [facility] for tasks assessed/performed                    Past Medical History:  Diagnosis Date   Breast cancer ([place])    Family history of ovarian cancer 09/02/2017   Family history of prostate cancer in father 09/02/2017   Family history of uterine cancer 09/02/2017   Plantar fasciitis    Past Surgical History:  Procedure Laterality Date   PORT-A-CATH REMOVAL Left 2020   right lumpectomy, sentinel node biopsy, and bilateral mammoplasty  10/2017   [facility]   Patient Active Problem List   Diagnosis Date Noted   Overweight (BMI 25.0-29.9) 12/31/2020   Hyperlipidemia 12/30/2020   Neuropathy due to chemotherapeutic drug ([place]) 12/31/2017   Port-A-Cath in place 11/12/2017   Genetic testing 09/15/2017   Malignant neoplasm of upper-inner quadrant of right breast in female, estrogen receptor positive ([place]) 08/27/2017   Vitamin D deficiency 12/09/2015   Medication management 12/09/2015   Dense breast tissue 12/09/2015    PCP: Hayden Rasmussen, MD  REFERRING PROVIDER: Dorothyann Gibbs, NP  REFERRING DIAG:  R35.0 (ICD-10-CM) - Urinary frequency N94.10 (ICD-10-CM) - Dyspareunia in female T26.6X5A (ICD-10-CM) - Adverse effect of antigonadotrophins, antiestrogens, antiandrogens, not elsewhere classified, initial encounter  THERAPY DIAG:  Muscle weakness (generalized)  Abnormal posture  Other muscle spasm  Unspecified lack of coordination  Rationale for Evaluation and Treatment Rehabilitation  ONSET DATE: years  SUBJECTIVE:                                                                                                                                                                                            SUBJECTIVE STATEMENT: Pt was sore after the dry needling.  The Rt hip was sore due to lying on the couch with the dog all night.  Pt states doing the dilator took less time to get in. I am getting less scratchy feeling vaginally with the moisturizing.  Still pain with intercourse but getting better and still some leakage and increased frequency due to thinking I will leak.  Fluid intake: Yes: 60 oz water,  tea sometimes but not regular       PAIN:  Are you having pain? No  NPRS scale: 3-4/10 Rt>Lt inner thigh Pain location:  intercourse, internally vaginally.  Pain reported in feet and thighs   PRECAUTIONS: None  WEIGHT BEARING RESTRICTIONS No  FALLS:  Has patient fallen in last 6 months? 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  PLOF: Independent  PATIENT GOALS to have less pain, more regular voids  PERTINENT HISTORY:  personal history of triple positive breast cancer at age 52 and family history of ovarian cancer, BRCA negative on genetic testing Vaginal atrophy Malignant neoplasm of upper-inner quadrant of right breast in female, estrogen receptor positive   Sexual abuse: No  BOWEL MOVEMENT Pain with bowel movement: No Type of bowel movement:Type (Bristol Stool Scale) 2-3, Frequency daily, and Strain Yes but this has improved since starting using step stool in bathroom Fully empty rectum: No Leakage: No Pads: No Fiber supplement: Yes:    URINATION Pain with urination: No sometimes, rarely will have a burning Fully empty bladder: No Stream: Strong and Weak Urgency: Yes: consistently Frequency: 2 hours sometimes sooner, usually gets up at night 1x Leakage: Coughing and Sneezing Pads:  No  INTERCOURSE Pain with intercourse: Initial Penetration, During Penetration, After Intercourse, and Pain Interrupts Intercourse Ability to have vaginal penetration:  Yes:   Climax: not painful but pain prevents climax sometimes Marinoff Scale: 2/3  PREGNANCY Vaginal deliveries 0 Tearing No C-section deliveries 0 Currently pregnant No  PROLAPSE None    OBJECTIVE:   DIAGNOSTIC FINDINGS:   COGNITION:  Overall cognitive status: Within functional limits for tasks assessed     SENSATION:  Light touch: Appears intact   Proprioception: Appears intact  MUSCLE LENGTH: Bil hamstring and adductor limited by 25%                POSTURE: rounded shoulders and forward head    LUMBARAROM/PROM  A/PROM A/PROM  eval  Flexion Limited by 25%  Extension WFL  Right lateral flexion Limited by 25%  Left lateral flexion Limited by 25%  Right rotation WFL  Left rotation WFL   (Blank rows = not tested)  LOWER EXTREMITY ROM:  Bil WFL LOWER EXTREMITY MMT:   Bil hips 4/5 grossly, knees and ankles 5/5   PALPATION:   General  no TTP, mild fascial restrictions noted in lower abdominal quadrants and over bladder                External Perineal Exam no TTP, dryness noted externally                             Internal Pelvic Floor no TTP however increased tension noted throughout  Patient confirms identification and approves PT to assess internal pelvic floor and treatment Yes  PELVIC MMT:   MMT eval 06/03/22  07/06/22  07/20/22  10/02/22   Vaginal 1/5 with max cues and quick release; without cues 0/5; 1s; 1 rep 1/5 initially but with quick release 2/5.   2/5 with minimal cues 2/5 - however with palpation of superficial muscles pt reported pain with palpation at bulbocavernosus bil and had several small trigger points.  3/5  Internal Anal Sphincter       External Anal Sphincter       Puborectalis       Diastasis Recti       (  Blank rows = not tested)        TONE: Mildly  increased  PROLAPSE: Not seen in hooklying   TODAY'S TREATMENT   11/17/22: Manual: Patient confirms identification and approves physical therapist to perform internal soft tissue work   Bil levators and obturators, fascial release around vaginal wall NMRE  Internal cues to exhale and engage pelvic floor Cues to breath without bulging abdomen and drawing in on the exhale Pt demonstrates 1-2/5 MMT of pelvic floor today up to 3 reps       PATIENT EDUCATION:  Education details: [id number], urge drill  Person educated: Patient Education method: Explanation, Demonstration, Tactile cues, Verbal cues, and Handouts Education comprehension: verbalized understanding and returned demonstration   HOME EXERCISE PROGRAM: [email or web address]  ASSESSMENT:  CLINICAL IMPRESSION: Pt has made progress but it is slowly progressing now.  Pt still has two functional goals that she is expected to achieve.  She will benefit from skilled PT to continue with more time between visits to work on using tools and exercises at home.  Pt is still needing cues to engage core and pelvic floor correctly without bulging.  Pt is not yet doing knack correctly as she was laughing and bulging into the pelvic floor during treatment.   Pt is recommended to continue skilled PT to work towards functional goals and is expected to continue to make progress at this time.  OBJECTIVE IMPAIRMENTS decreased coordination, decreased endurance, decreased mobility, decreased strength, increased fascial restrictions, increased muscle spasms, impaired flexibility, improper body mechanics, postural dysfunction, and pain.   ACTIVITY LIMITATIONS continence, locomotion level, and intercourse  PARTICIPATION LIMITATIONS: interpersonal relationship and community activity  PERSONAL FACTORS Past/current experiences, Time since onset of injury/illness/exacerbation, and 1 comorbidity: medical history  are also affecting patient's functional outcome.    REHAB POTENTIAL: Good  CLINICAL DECISION MAKING: Stable/uncomplicated  EVALUATION COMPLEXITY: Low   GOALS: Goals reviewed with patient? Yes  SHORT TERM GOALS: Target date: 05/26/2022  Pt to be I with HEP.  Baseline: Goal status: MET  2.  Pt to be I with use of feminine moisturizers and lubricants as needed for improved vaginal health and decreased pain with penetration.  Baseline:  Goal status: MET  3.  Pt to demonstrated improved coordination with pelvic floor and breathing mechanics with body weight squats without leakage or pain for improved pelvic stability.  Baseline:  Goal status: MET   LONG TERM GOALS: Target date: 02/09/23  Pt to be I with advanced HEP.  Baseline:  Goal status: MET  2.  Pt to demonstrate at least 5/5 bil hip strength for improved pelvic stability and functional squats without leakage.  Baseline:  Goal status: MET  3. Pt to demonstrate at least 3/5 pelvic floor strength for improved pelvic stability and decreased strain at pelvic floor/ decrease leakage.  Baseline:  Goal status: MET  4.  Pt to report improved time between bladder voids to at least 2.5 hours for improved QOL with decreased urinary frequency.   Baseline:  Goal status: met  5.  Pt to report no more than 2/10 pain with vaginal penetration for improved QOL and decreased pain with medical procedures. Baseline: 4/10 currently, but up to 5/10 initially  Goal status:  Ongoing  6.  Pt will be able to correctly engage core and pelvic floor in order to knack without leakage. Baseline:  Goal status: REVISED      PLAN: PT FREQUENCY: every other week  PT DURATION: 12 weeks   PLANNED INTERVENTIONS: Therapeutic  exercises, Therapeutic activity, Neuromuscular re-education, Patient/Family education, Self Care, Joint mobilization, Aquatic Therapy, Dry Needling, Spinal mobilization, Cryotherapy, Moist heat, scar mobilization, Taping, Biofeedback, and Manual therapy  PLAN FOR NEXT  SESSION: knack and core activation, continue dry needling to lumbar, adductors, gluteals  Gustavus Bryant, PT, DPT 11/17/22 3:54 PM     [facility] [address] [place], [place] [address] Phone # [phone] Fax [phone]

## 2022-11-18 ENCOUNTER — Encounter: Payer: Self-pay | Admitting: Physical Therapy

## 2022-11-18 ENCOUNTER — Ambulatory Visit: Payer: BC Managed Care – PPO | Admitting: Physical Therapy

## 2022-11-18 DIAGNOSIS — M6281 Muscle weakness (generalized): Secondary | ICD-10-CM | POA: Diagnosis not present

## 2022-11-18 DIAGNOSIS — R293 Abnormal posture: Secondary | ICD-10-CM

## 2022-11-18 DIAGNOSIS — T451X5A Adverse effect of antineoplastic and immunosuppressive drugs, initial encounter: Secondary | ICD-10-CM

## 2022-11-18 DIAGNOSIS — M62838 Other muscle spasm: Secondary | ICD-10-CM

## 2022-11-18 DIAGNOSIS — M25572 Pain in left ankle and joints of left foot: Secondary | ICD-10-CM

## 2022-11-18 DIAGNOSIS — R279 Unspecified lack of coordination: Secondary | ICD-10-CM

## 2022-11-18 DIAGNOSIS — R262 Difficulty in walking, not elsewhere classified: Secondary | ICD-10-CM

## 2022-11-18 NOTE — Therapy (Signed)
OUTPATIENT PHYSICAL THERAPY LOWER EXTREMITY    PHYSICAL THERAPY DISCHARGE SUMMARY  Visits from Start of Care: 9  Current functional level related to goals / functional outcomes: Patient continues to experience various aches and pains. She has purchased new shoes and orthotics to provide improved support at feet. She has HEP for stretching and strengthening to improve postural control and LE stability to manage her pain.   Remaining deficits: Fluctuating levels of pain   Education / Equipment: HEP   Patient agrees to discharge. Patient goals were partially met. Patient is being discharged due to maximized rehab potential.   See note below for Objective Data and Assessment of Progress/Goals.     Patient Name: HELENMARIE GUILE MRN: [id number] DOB:05/22/1971, 52 y.o., female   END OF SESSION:  PT End of Session - 11/18/22 1552     Visit Number 32    Date for PT Re-Evaluation 02/09/23    PT Start Time [id number]    PT Stop Time 1626    PT Time Calculation (min) 39 min    Activity Tolerance Patient tolerated treatment well    Behavior During Therapy [facility] for tasks assessed/performed               Past Medical History:  Diagnosis Date   Breast cancer ([place])    Family history of ovarian cancer 09/02/2017   Family history of prostate cancer in father 09/02/2017   Family history of uterine cancer 09/02/2017   Plantar fasciitis    Past Surgical History:  Procedure Laterality Date   PORT-A-CATH REMOVAL Left 2020   right lumpectomy, sentinel node biopsy, and bilateral mammoplasty  10/2017   [facility]   Patient Active Problem List   Diagnosis Date Noted   Overweight (BMI 25.0-29.9) 12/31/2020   Hyperlipidemia 12/30/2020   Neuropathy due to chemotherapeutic drug ([place]) 12/31/2017   Port-A-Cath in place 11/12/2017   Genetic testing 09/15/2017   Malignant neoplasm of upper-inner quadrant of right breast in female, estrogen receptor positive ([place]) 08/27/2017   Vitamin D deficiency  12/09/2015   Medication management 12/09/2015   Dense breast tissue 12/09/2015    PCP: Horald Pollen  REFERRING PROVIDER: Leeroy Cha  REFERRING DIAG: Neuropathy due to chemotherapy  THERAPY DIAG:  Muscle weakness (generalized)  Abnormal posture  Other muscle spasm  Pain in left ankle and joints of left foot  Peripheral neuropathy due to chemotherapy ([place])  Unspecified lack of coordination  Difficulty in walking, not elsewhere classified  Rationale for Evaluation and Treatment: Rehabilitation  ONSET DATE: Patient reports neuropathy for several years since breast cancer diagnosis in 2018  SUBJECTIVE:   SUBJECTIVE STATEMENT: Patient reports that she is hurting a lot. She slept on the couch with her sick dog. She also has new shoes which she is getting used to . Her feet aren't as sore, but her R hip is very sore.  PERTINENT HISTORY: personal history of triple positive breast cancer at age 34 and family history of ovarian cancer, BRCA negative on genetic testing Vaginal atrophy Malignant neoplasm of upper-inner quadrant of right breast in female, estrogen receptor positive  PAIN:  Are you having pain? Yes: NPRS scale: 4/10 Pain location: bilateral feet Pain description: tingling after sitting for extended periods, throbbing, sharp/stabbing pains Aggravating factors: sitting, walking, standing/cooking more than 20 minutes, squatting  Relieving factors: topical CBD, ice socks, topical ointments  PRECAUTIONS: None  WEIGHT BEARING RESTRICTIONS: No  FALLS:  Has patient fallen in last 6 months? No  LIVING ENVIRONMENT: Lives with:  lives with their spouse Lives in: House/apartment Stairs: Yes: External: 5 steps; on left going up Has following equipment at home: None  OCCUPATION: Consulting civil engineer  PLOF: Independent  PATIENT GOALS: walk without pain  NEXT MD VISIT:   OBJECTIVE:   DIAGNOSTIC FINDINGS: TTP bilateral feet and plantar fascia. Decreased joint  mobility in MTPs and talocrural joint. Decreased ankle mobility limiting ability to squat, bend, lift, and with decreased activity tolerance.   PATIENT SURVEYS:  LEFS administer at next session  COGNITION: Overall cognitive status: Within functional limits for tasks assessed     SENSATION: Light touch: decreased touch on left LE vs left   POSTURE: decreased lumbar lordosis and posterior pelvic tilt  PALPATION: Significant TTP bilateral plantar fascia, gastroc, anterior tib, posterior tib, foot intrinsics  LOWER EXTREMITY ROM:  Active ROM Right eval Left eval  Hip flexion    Hip extension    Hip abduction    Hip adduction    Hip internal rotation    Hip external rotation    Knee flexion    Knee extension    Ankle dorsiflexion -4 6  Ankle plantarflexion 47 51  Ankle inversion 40 42  Ankle eversion 11 17   (Blank rows = not tested)  LOWER EXTREMITY MMT:  MMT Right eval Left eval  Hip flexion    Hip extension    Hip abduction    Hip adduction    Hip internal rotation    Hip external rotation    Knee flexion    Knee extension    Ankle dorsiflexion    Ankle plantarflexion    Ankle inversion    Ankle eversion     (Blank rows = not tested)  LOWER EXTREMITY SPECIAL TESTS:  Ankle special tests: Tibial torsion test: negative, Talar tilt test: negative, and Thompson's test: negative  FUNCTIONAL TESTS:  Timed up and go (TUG): 10.98  GAIT: Distance walked: 25 Assistive device utilized: None Level of assistance: Complete Independence Comments: scissoring and decreased stride length bilaterally   TODAY'S TREATMENT:                                                                                                                              DATE:  11/18/22 NuStep L3 x 6 minutes for gentle warm up Education on correct foam roller techniques. Assessment of R hip and great toe with no significant findings. Re-assessed for D/C.  Ionto to R femoral head for pain  relief.   11/11/22 NuStep L5 x 6 Education for use of foam roller, lying with roller perpendicular along spine, then moved all the way down with low back, gluts, ITB, quads, and calves. Patient returned demo of each, will practice at home.  11/04/22 NuStep x 6 minutes Sit to stand with g tband around knees, standing on Air es, 2 x 10 Education for proper lifting-practiced lifitng 5# from 12" height to upright stance x 5, then tried dead lift, ut only able  to perform 3 reps. Knees were popping throughout B side step against G tband resistance 3 x 6 steps each direction  10/30/22 Nustep L 5 12mn Resisted gait 4 way 30# 4 x each way 6 inch box step over 10 x then up and down 10x each leg Leg press 40# 10 reps toes up, 30# feet ER 10x,IR 10x Calf raises on leg press 30# 2 sets 10 Seated knee flexion, 20#, 2 x 12 reps Seated knee ext, 10# 6x, dropped to 5# 10 x STS from mat with green tband at thighs maintaining abd 2 sets 10 Standing on upside down BOSU-Ant/post weight shifts, lateral weight shifts, tandem stanceoccassional UE support. Static stand 2 x 30 sec Black bar DF and PF 15 x each    10/21/22 NuStep L5 x 6 minutes. Gait assessment with new orthotics. Feet appear to be in mid alignment Standing on upside down BOSU-Ant/post weight shifts, lateral weight shifts, no UE support. Static stand 3 x 30 sec Seated knee flexion, 20#, 2 x 10 reps Seated knee ext, 5#, 2 x 10 reps Supine with legs over physioball- bridge with pelvic tilt 10 reps Supine clamshells against G tband, 2 x 10 reps Standing B side step, 2 x 10 reps with G tband.  10/14/22 NuStep L5 x 6 minutes  Sit to stand with feet on air ex, holding 1# ball, 10 reps with OHP, then repeat with chest press Seated B knee flexion, 20#, 2 x 10 reps Seated B knee ext, 5#, 2 x 10 reps Standing on upside down BOSU- lateral weight shifts, forward and back, then circular weight shifts in each, BUE support, 10 each  B heel raises and  stretches with knee straight and slightly flexed, 10 reps Standing hip abd against Red Tband resistance 10 reps each, then 5 more each  10/07/22 NuStep L5 x 6 minutes Seated knee flex, 20#, 2 x 10 B LE. Seated knee ext 5#, B, 2 x 10 reps Sit to stand on Air ex, with Red Tband resistance at knees for abd, 10 reps B side stepping against Red tband resistance at knees, 4 x 5 steps each way Standing shoulder ext, rows, 10# 2 x 10 Shoulder ER against G tband, 2 x 10 reps Heel raises from the floor with UUE support, 2 x 10 reps Seated ankle eversion against yellow Tband, 2 x 10 reps Dorsalis ant stretch, sitting on feet.   10/01/22 Bike L3 x 6 minutes Supine clamshells against G tband x 10 Bridge with hip abd against G tband x 10 STM with tennis ball, B calves in sit Stanidng wall stretch for gastroc and soleus B  09/25/2022    PATIENT EDUCATION:  Education details: Discussed objective findings. Discussed POC and HEP Person educated: Patient Education method: Explanation, Demonstration, and Handouts Education comprehension: verbalized understanding, returned demonstration, and needs further education  HOME EXERCISE PROGRAM: Access Code: [email or web address] URL: [email or web address] Date: 09/25/2022 Prepared by: ARochel BromeDiy   ASSESSMENT:  CLINICAL IMPRESSION: Pt reports new R hip pain after sleeping on the couch and especially after foam rolling. TTP directly over femoral head, but does not appear to be muscular. She also asked about medial R great toe pain. Also does not appear to be muscular, encouraged to return to Dr if it does not improve. Reviewed foam roller, educating her to avoid the bony prominences. Re-assessed for D/C. All questions answered.  OBJECTIVE IMPAIRMENTS: Abnormal gait, decreased balance, difficulty walking, decreased ROM, decreased strength, and pain.   ACTIVITY  LIMITATIONS: carrying, lifting, bending, standing, squatting, and caring for  others  PARTICIPATION LIMITATIONS: community activity and occupation  PERSONAL FACTORS: 1-2 comorbidities: history of cancer and chronicity of symptoms  are also affecting patient's functional outcome.   REHAB POTENTIAL: Fair due to chemo and chronicity of symptoms/neuropathy  CLINICAL DECISION MAKING: Stable/uncomplicated  EVALUATION COMPLEXITY: Low   GOALS: Goals reviewed with patient? No  SHORT TERM GOALS: Target date: 10/23/2022   Patient will be independent and compliant with HEP to improve carryover of sessions Baseline: HEP provided. See above Goal status: met  2.  Patient will be able to demonstrate DF of right ankle to neutral in order to improve gait and mobility Baseline: lacking 4 degrees of neutral on right  Goal status: ongoing, 11/04/22- met  3.  Patient will be able to walk greater than 30 minutes without pain in order to improve work tolerance Baseline: met 11/18/22  4.  Patient will be able to squat and lift 15lbs from floor without increase in pain to perform housechores and take care of animals Baseline: squats with significantly rounded lumbar spine and is unable to lift greater than 10lbs without difficulty Goal status: 11/18/22-met  LONG TERM GOALS: Target date: 11/20/2022   Patient will be able ambulate greater than 1 hour to improve tolerance to work demands and ease with house chores Baseline: unable to stand/walk greater than 20 minutes  Goal status: 11/18/22-   2.  Patient will be able to demonstrate proper lifting mechanics to lift and squat greater than 30lbs from floor for work tasks and house chores Baseline:  Goal status: 11/18/22- Not met, unable to attempt  PLAN:  PT FREQUENCY: 1x/week  PT DURATION: 8 weeks  PLANNED INTERVENTIONS: Therapeutic exercises, Therapeutic activity, Neuromuscular re-education, Balance training, Gait training, Patient/Family education, Self Care, Joint mobilization, Joint manipulation, Stair training, Dry Needling,  Electrical stimulation, Cryotherapy, Moist heat, Taping, Vasopneumatic device, Traction, Manual therapy, and Re-evaluation  PLAN FOR NEXT SESSION: .   Marcelina Morel, DPT 11/18/2022, 6:51 PM  [facility] [place]. [place], [place], [address] Phone: [phone]   Fax:  [phone]

## 2022-11-24 ENCOUNTER — Encounter: Payer: BC Managed Care – PPO | Admitting: Physical Therapy

## 2022-11-30 ENCOUNTER — Encounter
Payer: BC Managed Care – PPO | Attending: Physical Medicine and Rehabilitation | Admitting: Physical Medicine and Rehabilitation

## 2022-11-30 VITALS — BP 139/75 | HR 83 | Ht 65.5 in | Wt 160.0 lb

## 2022-11-30 DIAGNOSIS — E663 Overweight: Secondary | ICD-10-CM | POA: Insufficient documentation

## 2022-11-30 DIAGNOSIS — M25551 Pain in right hip: Secondary | ICD-10-CM | POA: Insufficient documentation

## 2022-11-30 DIAGNOSIS — M25561 Pain in right knee: Secondary | ICD-10-CM | POA: Diagnosis present

## 2022-11-30 DIAGNOSIS — G8929 Other chronic pain: Secondary | ICD-10-CM | POA: Insufficient documentation

## 2022-11-30 DIAGNOSIS — T451X5A Adverse effect of antineoplastic and immunosuppressive drugs, initial encounter: Secondary | ICD-10-CM | POA: Diagnosis present

## 2022-11-30 DIAGNOSIS — M25552 Pain in left hip: Secondary | ICD-10-CM | POA: Insufficient documentation

## 2022-11-30 DIAGNOSIS — G620 Drug-induced polyneuropathy: Secondary | ICD-10-CM | POA: Insufficient documentation

## 2022-11-30 DIAGNOSIS — M7918 Myalgia, other site: Secondary | ICD-10-CM | POA: Diagnosis present

## 2022-11-30 DIAGNOSIS — M25562 Pain in left knee: Secondary | ICD-10-CM | POA: Insufficient documentation

## 2022-11-30 DIAGNOSIS — R293 Abnormal posture: Secondary | ICD-10-CM | POA: Insufficient documentation

## 2022-11-30 NOTE — Patient Instructions (Signed)
.  Insomnia: -Try to go outside near sunrise -Get exercise during the day.  -Turn off all devices an hour before bedtime.  -Teas that can benefit: chamomile, valerian root, Brahmi (Bacopa) -Can consider over the counter melatonin, magnesium, and/or L-theanine. Melatonin is an anti-oxidant with multiple health benefits. Magnesium is involved in greater than 300 enzymatic reactions in the body and most of [place] are deficient as our soil is often depleted. There are 7 different types of magnesium- Bioptemizer's is a supplement with all 7 types, and each has unique benefits. Magnesium can also help with constipation and anxiety.  -Pistachios naturally increase the production of melatonin -[employer] bamboo bed sheets are free from toxic chemicals.  -Tart cherry juice or a tart cherry supplement can improve sleep and soreness post-workout

## 2022-11-30 NOTE — Progress Notes (Signed)
Subjective:    Patient ID: Hannah Woodard, female    DOB: [birth year], 52 y.o.   MRN: [id number]  HPI Mrs.Demedeiros is a 52 year old woman who presents for f/u of athralgias secondary to chemotherapy.   1) Chemotherapy induced athralgias -she uses CBD oil with menthol and capsaicin and ice socks but these are sometimes too cold for her. Has not needed this as often -got 12 weeks relief from [place] after the first two applications, but she was not sure if it was efficacious last time.  -she continues to take gabapentin -her dog ate one of her orthotics and this worsened her pain recently -she continues to take liposomal vitamin C and 10,000U vitamin D per day.  -most pain was in the ball of the feet and big toes.  -she felt most benefit from the first [place] application, she felt less benefit from the last one. She has some burning the day of application but no uncomfortable burning afterward. After the more recent applications she has had a few days of burning.  -She thinks her pain may have been a little worse because she has been active.  -she asks whether she can go for a walk today, she got a new puppy -she used oral CBD with chem -Initially pain about 8 at worst in the mornings, now down to 5/10.  -she feels traditional medicine is still not open to herbal medicine -the neuropathy started with breast cancer chemotherapy.  -pain right now is 4/10 -pain is intermittent.  -has never tried Pre-Dx -she was on Taxol and Herceptin.  -she hates that she is on any drugs -she thinks she eats healthy and she takes vitamins -pain is mostly present on dorsum of both feet, as well as balls and heels. -charlie horses improved -she gets liposomal vitamin C -she benefited from the exercises given to her by PT -irritated right hip bursa using the foam roller -worse some tight jeans to church and developed a rash.  -could not abduct hip for about a week.  -she is nervous about the foam  roller -was bending leg back and forth for several weeks and then started feeling pain on the medial aspect of big toe  2) High iron level -she was told not to take iron supplement since her level was too high.   3) Hot flashes -continues to take gabapentin for these -not as bad -helps to stay away from caffeine, spicy foods, chocolate  4) Rash -she easily gets rashes   5) Poison Ivy: -she recently got while gardening.   Pain Inventory Average Pain 3 Pain Right Now 4 My pain is intermittent  LOCATION OF PAIN  feet,toes,leg,neck,thigh,fingers,back,hip,neck  BOWEL Number of stools per week: 7 Oral laxative use fiber powder Type of laxative  Enema or suppository use No  History of colostomy No  Incontinent No   BLADDER Normal  Able to self cath No  Bladder incontinence No  Frequent urination No  Leakage with coughing No  Difficulty starting stream No  Incomplete bladder emptying Yes    Mobility walk without assistance ability to climb steps?  yes do you drive?  yes  Function employed # of hrs/week 37.5  Neuro/Psych numbness tingling  Prior Studies N/a  Physicians involved in your care N/a   Family History  Problem Relation Age of Onset   Ovarian cancer Mother 39   Prostate cancer Father 22       'high gleason' unsure number   Ulcerative colitis Sister  Other Sister        abn ovaian growth, partial hysterectomy   Basal cell carcinoma Sister 64   Uterine cancer Maternal Aunt 55   Stroke Maternal Aunt 66   Basal cell carcinoma Brother    Skin cancer Paternal Uncle    Skin cancer Maternal Grandmother    Stroke Paternal Grandfather 72   Hydrocephalus Cousin    Social History   Socioeconomic History   Marital status: Married    Spouse name: Not on file   Number of children: Not on file   Years of education: Not on file   Highest education level: Not on file  Occupational History   Occupation: Multimedia programmer  Tobacco Use    Smoking status: Never   Smokeless tobacco: Never  Vaping Use   Vaping Use: Never used  Substance and Sexual Activity   Alcohol use: Not Currently    Comment: rarely    Drug use: No   Sexual activity: Yes    Partners: Male    Birth control/protection: Other-see comments    Comment: chemical menopaus  Other Topics Concern   Not on file  Social History Narrative   Not on file   Social Determinants of Health   Financial Resource Strain: Not on file  Food Insecurity: Not on file  Transportation Needs: Not on file  Physical Activity: Not on file  Stress: Not on file  Social Connections: Not on file   Past Surgical History:  Procedure Laterality Date   PORT-A-CATH REMOVAL Left 2020   right lumpectomy, sentinel node biopsy, and bilateral mammoplasty  10/2017   [facility]   Past Medical History:  Diagnosis Date   Breast cancer ([facility] [facility])    Family history of ovarian cancer 09/02/2017   Family history of prostate cancer in father 09/02/2017   Family history of uterine cancer 09/02/2017   Plantar fasciitis    BP 139/75   Pulse 83   Ht 5' 5.5" (1.664 m)   Wt 160 lb (72.6 kg)   LMP 10/05/2017   SpO2 95%   BMI 26.22 kg/m   Opioid Risk Score:   Fall Risk Score:  `1  Depression screen [facility] 2/9     09/07/2022    9:29 AM 12/09/2021    2:05 PM 09/02/2021    3:09 PM 08/26/2021    9:51 AM 12/31/2020    2:42 PM 01/27/2018    9:17 AM  Depression screen PHQ 2/9  Decreased Interest 0 0 0 0 0 0  Down, Depressed, Hopeless 0 0 0 1 0 0  PHQ - 2 Score 0 0 0 1 0 0  Altered sleeping    2    Tired, decreased energy    2    Change in appetite    0    Feeling bad or failure about yourself     1    Trouble concentrating    0    Moving slowly or fidgety/restless    0    Suicidal thoughts    0    PHQ-9 Score    6         Review of Systems  Constitutional:  Positive for chills, fever and unexpected weight change.  HENT: Negative.    Eyes: Negative.   Respiratory: Negative.    Cardiovascular:  Negative.  Negative for chest pain.  Endocrine: Negative.   Genitourinary: Negative.   Musculoskeletal: Negative.   Skin: Negative.   Allergic/Immunologic: Negative.   Neurological: Negative.   Hematological:  Negative.   Psychiatric/Behavioral: Negative.         Objective:   Physical Exam Gen: no distress, normal appearing, weight 160 lbs, BMI 26.22, BP 139/75 HEENT: oral mucosa pink and moist, NCAT Cardio: Reg rate Chest: normal effort, normal rate of breathing Abd: soft, non-distended Ext: no edema Psych: pleasant, normal affect Skin: intact Neuro: Alert and oriented x3.  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  -Discussed benefits of exercise in reducing pain. -continue CBD oil -commended on trying ashwagandha -will not repeat Qutenza since did not help last time  -Discussed current symptoms of pain and history of pain.  -Discussed benefits of exercise in reducing pain. -Discussed following foods that may reduce pain: 1) Ginger (especially studied for arthritis)- reduce leukotriene production to decrease inflammation 2) Blueberries- high in phytonutrients that decrease inflammation 3) Salmon- marine omega-3s reduce joint swelling and pain 4) Pumpkin seeds- reduce inflammation 5) dark chocolate- reduces inflammation 6) turmeric- reduces inflammation 7) tart cherries - reduce pain and stiffness 8) extra virgin olive oil - its compound olecanthal helps to block prostaglandins  9) chili peppers- can be eaten or applied topically via capsaicin 10) mint- helpful for headache, muscle aches, joint pain, and itching 11) garlic- reduces inflammation  Link to further information on diet for chronic pain: [email or web address]    2) Hot flashes: -continue gabapentin PRN -discussed that she is on such a low dose that she does not need to wean off.   3) Overweight: -Educated that current weight is 160, commended on losing 10 lbs since I met her -discussed that decreasing weight can help decrease weight on joints, and thus lessen pain.  -Educated regarding health benefits of weight loss- for pain, general health, chronic disease prevention, immune health, mental health.  -encouraged higher protein, lower bread.  -Will monitor weight every visit.  -Consider [place] tea daily.  -Discussed the benefits of intermittent fasting. -Discussed foods that can assist in weight loss: 1) leafy greens- high in fiber and nutrients 2) dark chocolate- improves metabolism (if prefer sweetened, best to sweeten with honey instead of sugar).  3) cruciferous vegetables- high in fiber and protein 4) full fat yogurt: high in healthy fat, protein, calcium, and probiotics 5) apples- high in a variety of phytochemicals 6) nuts- high in fiber and protein that increase feelings of fullness 7) grapefruit: rich in nutrients, antioxidants, and fiber (not to be taken with anticoagulation) 8) beans- high in protein and fiber 9) salmon- has high quality protein and healthy fats 10) green tea- rich in polyphenols 11) eggs- rich in choline and vitamin D 12) tuna- high protein, boosts metabolism 13) avocado- decreases visceral abdominal fat 14) chicken (pasture raised): high in protein and iron 15) blueberries- reduce abdominal fat and cholesterol 16) whole grains- decreases calories retained during digestion, speeds metabolism 17) chia seeds- curb appetite 18) chilies- increases fat metabolism  -Discussed supplements that can be used:  1) Metatrim '400mg'$  BID 30 minutes before breakfast and dinner  2) Sphaeranthus indicus and Garcinia mangostana (combinations of these and #1 can be found in capsicum and zychrome  3) green coffee bean extract  '400mg'$  twice per day or Irvingia (african mango) 150 to '300mg'$  twice per day.   4) Athralgias:  -discussed response to therapy -commended on being able to walk 1 hour and a half!  5) Pain on bilateral  big toes: -discussed toe spacing socks -discussed using wide toe box -discussed that she has formation of mild bunion -continue insoles  6) Insomnia: -continue pregnancy pillow -discussed that she is a left side sleeper -discussed her challenges in finding the right pillow.  -Try to go outside near sunrise -Get exercise during the day.  -Turn off all devices an hour before bedtime.  -Teas that can benefit: chamomile, valerian root, Brahmi (Bacopa) -Can consider over the counter melatonin, magnesium, and/or L-theanine. Melatonin is an anti-oxidant with multiple health benefits. Magnesium is involved in greater than 300 enzymatic reactions in the body and most of [place] are deficient as our soil is often depleted. There are 7 different types of magnesium- Bioptemizer's is a supplement with all 7 types, and each has unique benefits. Magnesium can also help with constipation and anxiety.  -Pistachios naturally increase the production of melatonin -Cozy Earth bamboo bed sheets are free from toxic chemicals.  -Tart cherry juice or a tart cherry supplement can improve sleep and soreness post-workout  7) Protracted posture -discussed exercises for postural correction -prescribed PT -discussed the role that foot abnormalities can play in contributing to postural abnormalities    >40 minutes spent in review of chart, discussion of athralgias, pain on left big toe medial aspect present on both feet, discussed response to Pt, discussed aggravated right hip bursitis, discussed posture and that she has only been given 1 exercise to improve on posture, foam rolling, that she developed hip bursitis, discussed benefits of pushups, arm circles, lifting arms out of the forward plane, discussed that she can walk  1.5 hours, discussed her insoles, discussed mild bunions, discussed that sensation is intact, discussed her improved sleep when she does her exercises, discussed that she uses a pregnancy pillow when she sleeps, discussed that her insoles have helped

## 2022-12-01 ENCOUNTER — Ambulatory Visit: Payer: BC Managed Care – PPO | Admitting: Physical Therapy

## 2022-12-15 ENCOUNTER — Encounter: Payer: Self-pay | Admitting: Physical Therapy

## 2022-12-15 ENCOUNTER — Ambulatory Visit: Payer: BC Managed Care – PPO | Attending: Physical Medicine and Rehabilitation | Admitting: Physical Therapy

## 2022-12-15 DIAGNOSIS — R293 Abnormal posture: Secondary | ICD-10-CM | POA: Diagnosis present

## 2022-12-15 DIAGNOSIS — M62838 Other muscle spasm: Secondary | ICD-10-CM | POA: Insufficient documentation

## 2022-12-15 DIAGNOSIS — M542 Cervicalgia: Secondary | ICD-10-CM | POA: Diagnosis not present

## 2022-12-15 NOTE — Therapy (Signed)
OUTPATIENT PHYSICAL THERAPY CERVICAL EVALUATION   Patient Name: Hannah Woodard MRN: [id number] DOB:17-Apr-1971, 52 y.o., female Today's Date: 12/16/2022  END OF SESSION:  PT End of Session - 12/16/22 1103     Visit Number 1    Authorization Type BCBS    Authorization Time Period 12/15/22 to 02/09/23    PT Start Time 1615    PT Stop Time 1700    PT Time Calculation (min) 45 min    Activity Tolerance Patient tolerated treatment well;No increased pain    Behavior During Therapy [facility] for tasks assessed/performed             Past Medical History:  Diagnosis Date   Breast cancer ([place])    Family history of ovarian cancer 09/02/2017   Family history of prostate cancer in father 09/02/2017   Family history of uterine cancer 09/02/2017   Plantar fasciitis    Past Surgical History:  Procedure Laterality Date   PORT-A-CATH REMOVAL Left 2020   right lumpectomy, sentinel node biopsy, and bilateral mammoplasty  10/2017   [facility]   Patient Active Problem List   Diagnosis Date Noted   Overweight (BMI 25.0-29.9) 12/31/2020   Hyperlipidemia 12/30/2020   Neuropathy due to chemotherapeutic drug ([place]) 12/31/2017   Port-A-Cath in place 11/12/2017   Genetic testing 09/15/2017   Malignant neoplasm of upper-inner quadrant of right breast in female, estrogen receptor positive (Hannah Woodard) 08/27/2017   Vitamin D deficiency 12/09/2015   Medication management 12/09/2015   Dense breast tissue 12/09/2015    PCP: Horald Pollen, MD  REFERRING PROVIDER: Leeroy Cha, MD  REFERRING DIAG: cervical myofascial  THERAPY DIAG:  Abnormal posture  Cervicalgia  Other muscle spasm  Rationale for Evaluation and Treatment: Rehabilitation  ONSET DATE: unsure  SUBJECTIVE:                                                                                                                                                                                                         SUBJECTIVE STATEMENT: Pt states  that she is concerned with her posture. She is working with Pelvic PT to address her lower core but feels that she needs help strengthening her neck and shoulders. No specific pain noted during the day, but will notice she can sleep "wrong" at night and have some discomfort with this.  PERTINENT HISTORY:  Breast cancer  PAIN:  Are you having pain? No  PRECAUTIONS: None  WEIGHT BEARING RESTRICTIONS: No  FALLS:  Has patient fallen in last 6 months? No  LIVING ENVIRONMENT: Lives with: lives with their family  Lives in: House/apartment   OCCUPATION: works with kindergartners always having to lean over  PLOF: Independent  PATIENT GOALS: to improve posture and strength   NEXT MD VISIT: none for this   OBJECTIVE:   DIAGNOSTIC FINDINGS:  N/A  PATIENT SURVEYS:  FOTO need next visit  COGNITION: Overall cognitive status: Within functional limits for tasks assessed  SENSATION: Pt denies N/T in the UEs  POSTURE: rounded shoulders, forward head, and increased lumbar lordosis  PALPATION: Muscle spasm Rt and Lt upper trap, cervical paraspinals    THORACIC ROM:  20 deg bilateral, (+) neck pain each direction  CERVICAL ROM:   Active ROM A/PROM (deg) eval  Flexion WNL pain  Extension WNL  Right lateral flexion   Left lateral flexion   Right rotation 50 pain  Left rotation 40   (Blank rows = not tested)  UPPER EXTREMITY ROM:  RBB: WNL bilateral RBH: WNL bilateral    UPPER EXTREMITY MMT:  MMT Right eval Left eval  Shoulder flexion    Shoulder extension    Shoulder abduction    Shoulder adduction    Shoulder extension    Shoulder internal rotation    Shoulder external rotation    Middle trapezius    Lower trapezius    Elbow flexion    Elbow extension    Wrist flexion    Wrist extension    Wrist ulnar deviation    Wrist radial deviation    Wrist pronation    Wrist supination    Grip strength     (Blank rows = not tested)  CERVICAL SPECIAL TESTS:   Neck flexor muscle endurance test: Positive unable to hold chin tuck with lift off  FUNCTIONAL TESTS:    TODAY'S TREATMENT:                                                                                                                              DATE:  12/15/22 Neck retraction: x10 reps PT tactile/verbal cuing  Thoracic rotation with foam roll x10 reps    PATIENT EDUCATION:  Education details: eval findings/POC; implemented HEP Person educated: Patient Education method: Theatre stage manager Education comprehension: verbalized understanding, returned demonstration, and needs further education  HOME EXERCISE PROGRAM: Access Code: [email or web address] URL: [email or web address] Date: 12/16/2022 Prepared by: [facility]  Exercises - Supine Chin Tuck  - 2 x daily - 7 x weekly - 1 sets - 10 reps - Sidelying Open Book Thoracic Rotation with Knee on Foam Roll  - 2 x daily - 7 x weekly - 1 sets - 10 reps  ASSESSMENT:  CLINICAL IMPRESSION: Patient is a 52 y.o. F who was seen today for physical therapy evaluation and treatment for concerns over her poor posture. Pt has rounded shoulders and forward head noted at rest. She is able to self correct, but lacks the mobility, endurance and body awareness to maintain this throughout the day while she  works with children. Pt has limited thoracic mobility, and upon evaluation of the cervical spine had limited and painful active rotation. Pt would benefit from skilled PT to address her limitations in thoracic and cervical spine mobility, decrease muscle spasm and improve her posture and efficiency with daily activity.   OBJECTIVE IMPAIRMENTS: decreased activity tolerance, decreased endurance, decreased mobility, decreased strength, increased muscle spasms, impaired flexibility, postural dysfunction, and pain.   ACTIVITY LIMITATIONS: caring for others and sleep  PARTICIPATION LIMITATIONS:  occupation  PERSONAL FACTORS: Age, Fitness, and Past/current experiences are also affecting patient's functional outcome.   REHAB POTENTIAL: Good  CLINICAL DECISION MAKING: Stable/uncomplicated  EVALUATION COMPLEXITY: Low   GOALS: Goals reviewed with patient? Yes  SHORT TERM GOALS: Target date: 12/22/22  Pt will be independent with her initial HEP to improve thoracic and cervical spine mobility. Baseline:  Goal status: INITIAL   LONG TERM GOALS: Target date: 02/09/23  Pt will have pain free active cervical ROM. Baseline:  Goal status: INITIAL  2.  Pt will have atleast 40 deg of active thoracic rotation ROM. Baseline:  Goal status: INITIAL  3.  Pt will report 50% improvement in her posture awareness and overall confidence in her posture from the start of PT. Baseline:  Goal status:   4.  Pt will have improved cervical flexor strength and endurance evident by her ability to hold deep cervical flexion test for up to 15 seconds without pain. Baseline:  Goal status: INITIAL  5.  Pt will be independent with an advanced HEP to further progress her postural strength and endurance. Baseline:  Goal status: IN PROGRESS    PLAN:  PT FREQUENCY: 1x/week  PT DURATION: 8 weeks  PLANNED INTERVENTIONS: Therapeutic exercises, Therapeutic activity, Neuromuscular re-education, Patient/Family education, Self Care, Joint mobilization, Dry Needling, Spinal manipulation, Spinal mobilization, Cryotherapy, Moist heat, Taping, Manual therapy, and Re-evaluation  PLAN FOR NEXT SESSION: thoracic mobility, cervical spine ROM, add scap posture strengthening to HEP; DN as needed    11:16 AM,12/16/22 Sherol Dade PT, [place] at [place]

## 2022-12-21 ENCOUNTER — Encounter: Payer: Self-pay | Admitting: Physical Therapy

## 2022-12-21 ENCOUNTER — Ambulatory Visit: Payer: BC Managed Care – PPO | Admitting: Physical Therapy

## 2022-12-21 DIAGNOSIS — R293 Abnormal posture: Secondary | ICD-10-CM

## 2022-12-21 DIAGNOSIS — M6281 Muscle weakness (generalized): Secondary | ICD-10-CM

## 2022-12-21 DIAGNOSIS — M542 Cervicalgia: Secondary | ICD-10-CM

## 2022-12-21 DIAGNOSIS — M62838 Other muscle spasm: Secondary | ICD-10-CM

## 2022-12-21 NOTE — Therapy (Signed)
OUTPATIENT PHYSICAL THERAPY TREATMENT NOTE   Patient Name: Hannah Woodard MRN: [id number] DOB:10-22-1970, 52 y.o., female Today's Date: 12/21/2022  PCP: Horald Pollen, MD  REFERRING PROVIDER: Leeroy Cha, MD   END OF SESSION:   PT End of Session - 12/21/22 1532     Visit Number 2    Date for PT Re-Evaluation 02/09/23    Authorization Type BCBS    Authorization Time Period 12/15/22 to 02/09/23    PT Start Time 1445    PT Stop Time 1530    PT Time Calculation (min) 45 min    Activity Tolerance Patient tolerated treatment well;No increased pain    Behavior During Therapy [facility] for tasks assessed/performed             Past Medical History:  Diagnosis Date   Breast cancer ([place])    Family history of ovarian cancer 09/02/2017   Family history of prostate cancer in father 09/02/2017   Family history of uterine cancer 09/02/2017   Plantar fasciitis    Past Surgical History:  Procedure Laterality Date   PORT-A-CATH REMOVAL Left 2020   right lumpectomy, sentinel node biopsy, and bilateral mammoplasty  10/2017   [facility]   Patient Active Problem List   Diagnosis Date Noted   Overweight (BMI 25.0-29.9) 12/31/2020   Hyperlipidemia 12/30/2020   Neuropathy due to chemotherapeutic drug ([place]) 12/31/2017   Port-A-Cath in place 11/12/2017   Genetic testing 09/15/2017   Malignant neoplasm of upper-inner quadrant of right breast in female, estrogen receptor positive ([place]) 08/27/2017   Vitamin D deficiency 12/09/2015   Medication management 12/09/2015   Dense breast tissue 12/09/2015    REFERRING DIAG: cervical myofascial   THERAPY DIAG: Abnormal posture   Cervicalgia   Other muscle spasm  Rationale for Evaluation and Treatment Rehabilitation  PERTINENT HISTORY: Breast Cancer  PRECAUTIONS: None  SUBJECTIVE:                                                                                                                                                                                       SUBJECTIVE STATEMENT:  Eval went ok. It is amazing how just moving my arm can wear me out.    PAIN:  Are you having pain? No   OBJECTIVE: (objective measures completed at initial evaluation unless otherwise dated)  DIAGNOSTIC FINDINGS:  N/A   PATIENT SURVEYS:  FOTO need next visit   COGNITION: Overall cognitive status: Within functional limits for tasks assessed   SENSATION: Pt denies N/T in the UEs   POSTURE: rounded shoulders, forward head, and increased lumbar lordosis   PALPATION: Muscle spasm Rt and Lt upper trap, cervical paraspinals  THORACIC ROM:  20 deg bilateral, (+) neck pain each direction   CERVICAL ROM:    Active ROM A/PROM (deg) eval A/ROM 12/21/22  Flexion WNL pain   Extension WNL   Right lateral flexion     Left lateral flexion     Right rotation 50 pain 70  Left rotation 40 60   (Blank rows = not tested)   UPPER EXTREMITY ROM:   RBB: WNL bilateral RBH: WNL bilateral       UPPER EXTREMITY MMT:   MMT Right eval Left eval  Shoulder flexion      Shoulder extension      Shoulder abduction      Shoulder adduction      Shoulder extension      Shoulder internal rotation      Shoulder external rotation      Middle trapezius      Lower trapezius      Elbow flexion      Elbow extension      Wrist flexion      Wrist extension      Wrist ulnar deviation      Wrist radial deviation      Wrist pronation      Wrist supination      Grip strength       (Blank rows = not tested)   CERVICAL SPECIAL TESTS:  Neck flexor muscle endurance test: Positive unable to hold chin tuck with lift off   FUNCTIONAL TESTS:      TODAY'S TREATMENT:     12/21/22: Seated neck AROM 10x each direction with VC for posture. Seated neck retraction 10x. Vc for more lift in the neck for more decompression. Sidelying thoracic rotation with foam roll 10x Bil, helped pt organize roll to where it is more comfortable.  Manual: soft tissue work  to neck in supine:RT> LT: occiput stretch and release Cervical Measurement for Rotation                                                                                                                            DATE:  12/15/22 Neck retraction: x10 reps PT tactile/verbal cuing  Thoracic rotation with foam roll x10 reps     PATIENT EDUCATION:  Education details: eval findings/POC; implemented HEP Person educated: Patient Education method: Theatre stage manager Education comprehension: verbalized understanding, returned demonstration, and needs further education   HOME EXERCISE PROGRAM: Access Code: [email or web address] URL: [email or web address] Date: 12/16/2022 Prepared by: [facility]   Exercises - Supine Chin Tuck  - 2 x daily - 7 x weekly - 1 sets - 10 reps - Sidelying Open Book Thoracic Rotation with Knee on Foam Roll  - 2 x daily - 7 x weekly - 1 sets - 10 reps   ASSESSMENT:   CLINICAL IMPRESSION:Pt did very well after her eval. No increase in neck pain with her neck exercises she only required some minor adjustments  to make them most comfortable. Pt was able to increase her cervical rotation today since her eval measurement. PTA encourgaged pt to maintain this motion. Pta noted occipital ridge was most tense with manual today.     OBJECTIVE IMPAIRMENTS: decreased activity tolerance, decreased endurance, decreased mobility, decreased strength, increased muscle spasms, impaired flexibility, postural dysfunction, and pain.    ACTIVITY LIMITATIONS: caring for others and sleep   PARTICIPATION LIMITATIONS: occupation   PERSONAL FACTORS: Age, Fitness, and Past/current experiences are also affecting patient's functional outcome.    REHAB POTENTIAL: Good   CLINICAL DECISION MAKING: Stable/uncomplicated   EVALUATION COMPLEXITY: Low     GOALS: Goals reviewed with patient? Yes   SHORT TERM GOALS: Target date: 12/22/22   Pt  will be independent with her initial HEP to improve thoracic and cervical spine mobility. Baseline:  Goal status: Goal met 12/21/22     LONG TERM GOALS: Target date: 02/09/23   Pt will have pain free active cervical ROM. Baseline:  Goal status: INITIAL   2.  Pt will have atleast 40 deg of active thoracic rotation ROM. Baseline:  Goal status: INITIAL   3.  Pt will report 50% improvement in her posture awareness and overall confidence in her posture from the start of PT. Baseline:  Goal status:    4.  Pt will have improved cervical flexor strength and endurance evident by her ability to hold deep cervical flexion test for up to 15 seconds without pain. Baseline:  Goal status: INITIAL   5.  Pt will be independent with an advanced HEP to further progress her postural strength and endurance. Baseline:  Goal status: IN PROGRESS       PLAN:   PT FREQUENCY: 1x/week   PT DURATION: 8 weeks   PLANNED INTERVENTIONS: Therapeutic exercises, Therapeutic activity, Neuromuscular re-education, Patient/Family education, Self Care, Joint mobilization, Dry Needling, Spinal manipulation, Spinal mobilization, Cryotherapy, Moist heat, Taping, Manual therapy, and Re-evaluation   PLAN FOR NEXT SESSION: thoracic mobility, cervical spine ROM, add scap posture strengthening to HEP; DN as needed     Darick Fetters,Raymonda, PTA 12/21/2022, 3:33 PM

## 2022-12-30 ENCOUNTER — Encounter: Payer: Self-pay | Admitting: Physical Therapy

## 2022-12-30 ENCOUNTER — Ambulatory Visit: Payer: BC Managed Care – PPO | Attending: Gynecologic Oncology | Admitting: Physical Therapy

## 2022-12-30 DIAGNOSIS — R279 Unspecified lack of coordination: Secondary | ICD-10-CM | POA: Diagnosis present

## 2022-12-30 DIAGNOSIS — M62838 Other muscle spasm: Secondary | ICD-10-CM

## 2022-12-30 DIAGNOSIS — M6281 Muscle weakness (generalized): Secondary | ICD-10-CM

## 2022-12-30 NOTE — Therapy (Signed)
OUTPATIENT PHYSICAL THERAPY FEMALE PELVIC TREATMENT   Patient Name: Hannah Woodard MRN: [id number] DOB:1971-05-10, 52 y.o., female Today's Date: 12/30/2022   PT End of Session - 12/30/22 1733     Visit Number 33    Date for PT Re-Evaluation 02/24/23    Authorization Type BCBS    PT Start Time 1450    PT Stop Time [id number]    PT Time Calculation (min) 43 min    Activity Tolerance Patient tolerated treatment well;No increased pain    Behavior During Therapy [facility] for tasks assessed/performed                     Past Medical History:  Diagnosis Date   Breast cancer    Family history of ovarian cancer 09/02/2017   Family history of prostate cancer in father 09/02/2017   Family history of uterine cancer 09/02/2017   Plantar fasciitis    Past Surgical History:  Procedure Laterality Date   PORT-A-CATH REMOVAL Left 2020   right lumpectomy, sentinel node biopsy, and bilateral mammoplasty  10/2017   [facility]   Patient Active Problem List   Diagnosis Date Noted   Overweight (BMI 25.0-29.9) 12/31/2020   Hyperlipidemia 12/30/2020   Neuropathy due to chemotherapeutic drug 12/31/2017   Port-A-Cath in place 11/12/2017   Genetic testing 09/15/2017   Malignant neoplasm of upper-inner quadrant of right breast in female, estrogen receptor positive 08/27/2017   Vitamin D deficiency 12/09/2015   Medication management 12/09/2015   Dense breast tissue 12/09/2015    PCP: Hayden Rasmussen, MD  REFERRING PROVIDER: Dorothyann Gibbs, NP  REFERRING DIAG:  R35.0 (ICD-10-CM) - Urinary frequency N94.10 (ICD-10-CM) - Dyspareunia in female T55.6X5A (ICD-10-CM) - Adverse effect of antigonadotrophins, antiestrogens, antiandrogens, not elsewhere classified, initial encounter  THERAPY DIAG:  Unspecified lack of coordination - Plan: PT plan of care cert/re-cert  Muscle weakness (generalized) - Plan: PT plan of care cert/re-cert  Other muscle spasm - Plan: PT plan of care  cert/re-cert  Rationale for Evaluation and Treatment Rehabilitation  ONSET DATE: years  SUBJECTIVE:                                                                                                                                                                                           SUBJECTIVE STATEMENT: Pt has not used dilators as much lately.  Still having tension and had difficulty with dilators last time she used them. Fluid intake: Yes: 60 oz water, tea sometimes but not regular       PAIN:  Are you having pain? No  NPRS scale: 3-4/10 Rt>Lt inner thigh Pain location:  intercourse, internally vaginally.  Pain reported in feet and thighs   PRECAUTIONS: None  WEIGHT BEARING RESTRICTIONS No  FALLS:  Has patient fallen in last 6 months? 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  PLOF: Independent  PATIENT GOALS to have less pain, more regular voids  PERTINENT HISTORY:  personal history of triple positive breast cancer at age 60 and family history of ovarian cancer, BRCA negative on genetic testing Vaginal atrophy Malignant neoplasm of upper-inner quadrant of right breast in female, estrogen receptor positive   Sexual abuse: No  BOWEL MOVEMENT Pain with bowel movement: No Type of bowel movement:Type (Bristol Stool Scale) 2-3, Frequency daily, and Strain Yes but this has improved since starting using step stool in bathroom Fully empty rectum: No Leakage: No Pads: No Fiber supplement: Yes:    URINATION Pain with urination: No sometimes, rarely will have a burning Fully empty bladder: No Stream: Strong and Weak Urgency: Yes: consistently Frequency: 2 hours sometimes sooner, usually gets up at night 1x Leakage: Coughing and Sneezing Pads: No  INTERCOURSE Pain with intercourse: Initial Penetration, During Penetration, After Intercourse, and Pain Interrupts  Intercourse Ability to have vaginal penetration:  Yes:   Climax: not painful but pain prevents climax sometimes Marinoff Scale: 2/3  PREGNANCY Vaginal deliveries 0 Tearing No C-section deliveries 0 Currently pregnant No  PROLAPSE None    OBJECTIVE:   DIAGNOSTIC FINDINGS:   COGNITION:  Overall cognitive status: Within functional limits for tasks assessed     SENSATION:  Light touch: Appears intact   Proprioception: Appears intact  MUSCLE LENGTH: Bil hamstring and adductor limited by 25%                POSTURE: rounded shoulders and forward head    LUMBARAROM/PROM  A/PROM A/PROM  eval  Flexion Limited by 25%  Extension WFL  Right lateral flexion Limited by 25%  Left lateral flexion Limited by 25%  Right rotation WFL  Left rotation WFL   (Blank rows = not tested)  LOWER EXTREMITY ROM:  Bil WFL LOWER EXTREMITY MMT:   Bil hips 4/5 grossly, knees and ankles 5/5   PALPATION:   General  no TTP, mild fascial restrictions noted in lower abdominal quadrants and over bladder                External Perineal Exam no TTP, dryness noted externally                             Internal Pelvic Floor no TTP however increased tension noted throughout  Patient confirms identification and approves PT to assess internal pelvic floor and treatment Yes  PELVIC MMT:   MMT eval 06/03/22  07/06/22  07/20/22  10/02/22  12/30/22   Vaginal 1/5 with max cues and quick release; without cues 0/5; 1s; 1 rep 1/5 initially but with quick release 2/5.   2/5 with minimal cues 2/5 - however with palpation of superficial muscles pt reported pain with palpation at bulbocavernosus bil and had several small trigger points.  3/5 2/5 due to tension   Internal Anal Sphincter        External Anal Sphincter        Puborectalis        Diastasis Recti        (Blank rows = not tested)        TONE: Mildly  increased  PROLAPSE: Not seen in hooklying   TODAY'S TREATMENT   11/17/22: Manual:  Patient confirms identification and approves physical therapist to perform internal soft tissue work   Bil levators and obturators, fascial release around vaginal wall NMRE  Internal cues to exhale and engage pelvic floor Cues to breath without bulging abdomen and drawing in on the exhale Pt demonstrates 1-2/5 MMT of pelvic floor today up to 3 reps   Trigger Point Dry-Needling  Treatment instructions: Expect mild to moderate muscle soreness. S/S of pneumothorax if dry needled over a lung field, and to seek immediate medical attention should they occur. Patient verbalized understanding of these instructions and education.  Patient Consent Given: Yes Education handout provided: Previously provided Muscles treated: lumbar multifidi, piriformis, glute medius, adductors bil Electrical stimulation performed: No Parameters: N/A Treatment response/outcome: increased soft tissue length and twitch response     PATIENT EDUCATION:  Education details: [id number], urge drill  Person educated: Patient Education method: Explanation, Demonstration, Tactile cues, Verbal cues, and Handouts Education comprehension: verbalized understanding and returned demonstration   HOME EXERCISE PROGRAM: [email or web address]  ASSESSMENT:  CLINICAL IMPRESSION: Pt has made progress but it is slowly progressing now.  Pt still has two functional goals that she is expected to achieve.  She will benefit from skilled PT to continue with more time between visits to work on using tools and exercises at home.  Pt still with high tonepelvic floor and weakness due to tension.  Pt not able to knack correctly due to tension. Pt is recommended to continue skilled PT to work towards functional goals and is expected to continue to make progress at this time.  OBJECTIVE IMPAIRMENTS decreased coordination, decreased endurance, decreased mobility, decreased strength, increased fascial restrictions, increased muscle spasms, impaired flexibility,  improper body mechanics, postural dysfunction, and pain.   ACTIVITY LIMITATIONS continence, locomotion level, and intercourse  PARTICIPATION LIMITATIONS: interpersonal relationship and community activity  PERSONAL FACTORS Past/current experiences, Time since onset of injury/illness/exacerbation, and 1 comorbidity: medical history  are also affecting patient's functional outcome.   REHAB POTENTIAL: Good  CLINICAL DECISION MAKING: Stable/uncomplicated  EVALUATION COMPLEXITY: Low   GOALS: Goals reviewed with patient? Yes  SHORT TERM GOALS: Target date: 05/26/2022  Pt to be I with HEP.  Baseline: Goal status: MET  2.  Pt to be I with use of feminine moisturizers and lubricants as needed for improved vaginal health and decreased pain with penetration.  Baseline:  Goal status: MET  3.  Pt to demonstrated improved coordination with pelvic floor and breathing mechanics with body weight squats without leakage or pain for improved pelvic stability.  Baseline:  Goal status: MET   LONG TERM GOALS: Target date: 02/09/23  Pt to be I with advanced HEP.  Baseline:  Goal status: MET  2.  Pt to demonstrate at least 5/5 bil hip strength for improved pelvic stability and functional squats without leakage.  Baseline:  Goal status: MET  3. Pt to demonstrate at least 3/5 pelvic floor strength for improved pelvic stability and decreased strain at pelvic floor/ decrease leakage.  Baseline:  Goal status: MET  4.  Pt to report improved time between bladder voids to at least 2.5 hours for improved QOL with decreased urinary frequency.   Baseline:  Goal status: met  5.  Pt to report no more than 2/10 pain with vaginal penetration for improved QOL and decreased pain with medical procedures. Baseline: 4/10 currently, but up to 5/10 initially  Goal status:  Ongoing  6.  Pt will be able to correctly engage core and pelvic floor in order to knack without leakage. Baseline:  Goal status:  REVISED      PLAN: PT FREQUENCY: every other week  PT DURATION: 12 weeks   PLANNED INTERVENTIONS: Therapeutic exercises, Therapeutic activity, Neuromuscular re-education, Patient/Family education, Self Care, Joint mobilization, Aquatic Therapy, Dry Needling, Spinal mobilization, Cryotherapy, Moist heat, scar mobilization, Taping, Biofeedback, and Manual therapy  PLAN FOR NEXT SESSION: knack and core activation, continue dry needling to lumbar, adductors, gluteals  Gustavus Bryant, PT, DPT 12/30/22 5:40 PM     [facility] [address] [place], [place] [address] Phone # [phone] Fax [phone]

## 2022-12-31 ENCOUNTER — Ambulatory Visit: Payer: BC Managed Care – PPO | Attending: Physical Medicine and Rehabilitation

## 2022-12-31 DIAGNOSIS — M542 Cervicalgia: Secondary | ICD-10-CM | POA: Insufficient documentation

## 2022-12-31 DIAGNOSIS — M6281 Muscle weakness (generalized): Secondary | ICD-10-CM | POA: Diagnosis not present

## 2022-12-31 DIAGNOSIS — R252 Cramp and spasm: Secondary | ICD-10-CM | POA: Diagnosis present

## 2022-12-31 DIAGNOSIS — M62838 Other muscle spasm: Secondary | ICD-10-CM | POA: Diagnosis present

## 2022-12-31 NOTE — Therapy (Signed)
OUTPATIENT PHYSICAL THERAPY FEMALE PELVIC TREATMENT   Patient Name: Hannah Woodard MRN: [id number] DOB:1971-05-20, 52 y.o., female Today's Date: 12/31/2022   PT End of Session - 12/31/22 0808     Visit Number 34    Date for PT Re-Evaluation 02/24/23    Authorization Type BCBS    Authorization Time Period 12/15/22 to 02/09/23    PT Start Time 0802    PT Stop Time [id number]    PT Time Calculation (min) 45 min    Activity Tolerance Patient tolerated treatment well;No increased pain    Behavior During Therapy [facility] for tasks assessed/performed                     Past Medical History:  Diagnosis Date   Breast cancer    Family history of ovarian cancer 09/02/2017   Family history of prostate cancer in father 09/02/2017   Family history of uterine cancer 09/02/2017   Plantar fasciitis    Past Surgical History:  Procedure Laterality Date   PORT-A-CATH REMOVAL Left 2020   right lumpectomy, sentinel node biopsy, and bilateral mammoplasty  10/2017   [facility]   Patient Active Problem List   Diagnosis Date Noted   Overweight (BMI 25.0-29.9) 12/31/2020   Hyperlipidemia 12/30/2020   Neuropathy due to chemotherapeutic drug 12/31/2017   Port-A-Cath in place 11/12/2017   Genetic testing 09/15/2017   Malignant neoplasm of upper-inner quadrant of right breast in female, estrogen receptor positive 08/27/2017   Vitamin D deficiency 12/09/2015   Medication management 12/09/2015   Dense breast tissue 12/09/2015    PCP: Hayden Rasmussen, MD  REFERRING PROVIDER: Dorothyann Gibbs, NP  REFERRING DIAG:  R35.0 (ICD-10-CM) - Urinary frequency N94.10 (ICD-10-CM) - Dyspareunia in female T50.6X5A (ICD-10-CM) - Adverse effect of antigonadotrophins, antiestrogens, antiandrogens, not elsewhere classified, initial encounter  THERAPY DIAG:  Muscle weakness (generalized)  Other muscle spasm  Cervicalgia  Cramp and spasm  Rationale for Evaluation and Treatment Rehabilitation  ONSET DATE:  years  SUBJECTIVE:                                                                                                                                                                                           SUBJECTIVE STATEMENT: Patient states she is doing ok.  Neck pain still present.     PAIN:  Are you having pain? No  NPRS scale: 3-4/10 Rt>Lt inner thigh Pain location:  intercourse, internally vaginally.  Pain reported in feet and thighs   PRECAUTIONS: None  WEIGHT BEARING RESTRICTIONS No  FALLS:  Has patient fallen in last 6 months? 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  PLOF: Independent  PATIENT GOALS to have less pain, more regular voids  PERTINENT HISTORY:  personal history of triple positive breast cancer at age 70 and family history of ovarian cancer, BRCA negative on genetic testing Vaginal atrophy Malignant neoplasm of upper-inner quadrant of right breast in female, estrogen receptor positive   Sexual abuse: No  BOWEL MOVEMENT Pain with bowel movement: No Type of bowel movement:Type (Bristol Stool Scale) 2-3, Frequency daily, and Strain Yes but this has improved since starting using step stool in bathroom Fully empty rectum: No Leakage: No Pads: No Fiber supplement: Yes:    URINATION Pain with urination: No sometimes, rarely will have a burning Fully empty bladder: No Stream: Strong and Weak Urgency: Yes: consistently Frequency: 2 hours sometimes sooner, usually gets up at night 1x Leakage: Coughing and Sneezing Pads: No  INTERCOURSE Pain with intercourse: Initial Penetration, During Penetration, After Intercourse, and Pain Interrupts Intercourse Ability to have vaginal penetration:  Yes:   Climax: not painful but pain prevents climax sometimes Marinoff Scale: 2/3  PREGNANCY Vaginal deliveries 0 Tearing No C-section deliveries 0 Currently  pregnant No  PROLAPSE None    OBJECTIVE:   DIAGNOSTIC FINDINGS:   COGNITION:  Overall cognitive status: Within functional limits for tasks assessed     SENSATION:  Light touch: Appears intact   Proprioception: Appears intact  MUSCLE LENGTH: Bil hamstring and adductor limited by 25%                POSTURE: rounded shoulders and forward head    LUMBARAROM/PROM  A/PROM A/PROM  eval  Flexion Limited by 25%  Extension WFL  Right lateral flexion Limited by 25%  Left lateral flexion Limited by 25%  Right rotation WFL  Left rotation WFL   (Blank rows = not tested)  LOWER EXTREMITY ROM:  Bil WFL LOWER EXTREMITY MMT:   Bil hips 4/5 grossly, knees and ankles 5/5   PALPATION:   General  no TTP, mild fascial restrictions noted in lower abdominal quadrants and over bladder                External Perineal Exam no TTP, dryness noted externally                             Internal Pelvic Floor no TTP however increased tension noted throughout  Patient confirms identification and approves PT to assess internal pelvic floor and treatment Yes  PELVIC MMT:   MMT eval 06/03/22  07/06/22  07/20/22  10/02/22  12/30/22   Vaginal 1/5 with max cues and quick release; without cues 0/5; 1s; 1 rep 1/5 initially but with quick release 2/5.   2/5 with minimal cues 2/5 - however with palpation of superficial muscles pt reported pain with palpation at bulbocavernosus bil and had several small trigger points.  3/5 2/5 due to tension   Internal Anal Sphincter        External Anal Sphincter        Puborectalis        Diastasis Recti        (Blank rows = not tested)        TONE: Mildly increased  PROLAPSE: Not seen in hooklying   TODAY'S TREATMENT  12/31/22: UBE x 5 min level 1 fwd only 3 way scapular stabilization 2 x 5 with green loop 4 D ball rolls  with light blue plyo ball x 20  Theraband (red) shoulder extension, rows, bilateral ER and bilateral horizontal abduction x 20  each TYI's over blue physio ball x 10 with 0#, then x 5 with 0# Doorway stretch x 3 holding 10 sec Educated on how rotator cuff weakness and restriction in the right shoulder could be contributing to her neck symptoms Manual: PROM C spine, upper trap stretch, levator stretch, STM to bilateral upper traps and levator, sub occipital release x 15 min   11/17/22: Manual: Patient confirms identification and approves physical therapist to perform internal soft tissue work   Bil levators and obturators, fascial release around vaginal wall NMRE  Internal cues to exhale and engage pelvic floor Cues to breath without bulging abdomen and drawing in on the exhale Pt demonstrates 1-2/5 MMT of pelvic floor today up to 3 reps   Trigger Point Dry-Needling  Treatment instructions: Expect mild to moderate muscle soreness. S/S of pneumothorax if dry needled over a lung field, and to seek immediate medical attention should they occur. Patient verbalized understanding of these instructions and education.  Patient Consent Given: Yes Education handout provided: Previously provided Muscles treated: lumbar multifidi, piriformis, glute medius, adductors bil Electrical stimulation performed: No Parameters: N/A Treatment response/outcome: increased soft tissue length and twitch response     PATIENT EDUCATION:  Education details: [id number], urge drill  Person educated: Patient Education method: Explanation, Demonstration, Tactile cues, Verbal cues, and Handouts Education comprehension: verbalized understanding and returned demonstration   HOME EXERCISE PROGRAM: Access Code: [email or web address] URL: [email or web address] Date: 12/31/2022 Prepared by: Candyce Churn  Exercises - Supine Diaphragmatic Breathing  - 1 x daily - 7 x weekly - 1 sets - 10 reps - Supine Pelvic Floor Contraction  - 1 x daily - 7 x weekly - 1 sets - 10 reps - Seated Table Hamstring Stretch  - 1 x daily - 7 x weekly - 1 sets -  2 reps - 30s holds - Supine Hip Adductor Stretch  - 1 x daily - 7 x weekly - 1 sets - 2 reps - 30s holds - Supine Bilateral Hip Internal Rotation Stretch  - 1 x daily - 7 x weekly - 1 sets - 2 reps - 30s holds - Tail Wag  - 1 x daily - 7 x weekly - 1 sets - 10 reps - 5s holds - Scapular Retraction with Resistance Advanced  - 2 x daily - 7 x weekly - 3 sets - 10 reps - Scapular Retraction with Resistance  - 2 x daily - 7 x weekly - 3 sets - 10 reps - Shoulder External Rotation and Scapular Retraction with Resistance  - 2 x daily - 7 x weekly - 3 sets - 10 reps - Standing Shoulder Horizontal Abduction with Resistance  - 1 x daily - 7 x weekly - 3 sets - 10 reps  ASSESSMENT:  CLINICAL IMPRESSION: Hannah Woodard is progressing appropriately.  She was able to tolerate additon of postural strengthening without any additional pain.  She has some noticeable atrophy in the right parascapular area and is slightly restricted in right shoulder from hx of radiation.  She would benefit from continuing skilled PT for postural and shoulder strengthening to reduce cervical spine pain.     OBJECTIVE IMPAIRMENTS decreased coordination, decreased endurance, decreased mobility, decreased strength, increased fascial restrictions, increased muscle spasms, impaired flexibility, improper body mechanics, postural dysfunction, and pain.   ACTIVITY LIMITATIONS continence, locomotion level, and intercourse  PARTICIPATION LIMITATIONS: interpersonal relationship and community  activity  PERSONAL FACTORS Past/current experiences, Time since onset of injury/illness/exacerbation, and 1 comorbidity: medical history  are also affecting patient's functional outcome.   REHAB POTENTIAL: Good  CLINICAL DECISION MAKING: Stable/uncomplicated  EVALUATION COMPLEXITY: Low   GOALS: Goals reviewed with patient? Yes  SHORT TERM GOALS: Target date: 05/26/2022  Pt to be I with HEP.  Baseline: Goal status: MET  2.  Pt to be I with use of  feminine moisturizers and lubricants as needed for improved vaginal health and decreased pain with penetration.  Baseline:  Goal status: MET  3.  Pt to demonstrated improved coordination with pelvic floor and breathing mechanics with body weight squats without leakage or pain for improved pelvic stability.  Baseline:  Goal status: MET   LONG TERM GOALS: Target date: 02/09/23  Pt to be I with advanced HEP.  Baseline:  Goal status: MET  2.  Pt to demonstrate at least 5/5 bil hip strength for improved pelvic stability and functional squats without leakage.  Baseline:  Goal status: MET  3. Pt to demonstrate at least 3/5 pelvic floor strength for improved pelvic stability and decreased strain at pelvic floor/ decrease leakage.  Baseline:  Goal status: MET  4.  Pt to report improved time between bladder voids to at least 2.5 hours for improved QOL with decreased urinary frequency.   Baseline:  Goal status: met  5.  Pt to report no more than 2/10 pain with vaginal penetration for improved QOL and decreased pain with medical procedures. Baseline: 4/10 currently, but up to 5/10 initially  Goal status:  Ongoing  6.  Pt will be able to correctly engage core and pelvic floor in order to knack without leakage. Baseline:  Goal status: REVISED      PLAN: PT FREQUENCY: every other week  PT DURATION: 12 weeks   PLANNED INTERVENTIONS: Therapeutic exercises, Therapeutic activity, Neuromuscular re-education, Patient/Family education, Self Care, Joint mobilization, Aquatic Therapy, Dry Needling, Spinal mobilization, Cryotherapy, Moist heat, scar mobilization, Taping, Biofeedback, and Manual therapy  PLAN FOR NEXT SESSION: knack and core activation, continue dry needling to lumbar, adductors, gluteals  Rivkah B. Lida Berkery, PT 12/31/22 9:34 AM  [facility] [address], [place], [place] [address] Phone # [phone] Fax [phone]

## 2023-01-05 ENCOUNTER — Ambulatory Visit: Payer: BC Managed Care – PPO | Attending: Physical Medicine and Rehabilitation

## 2023-01-05 DIAGNOSIS — R293 Abnormal posture: Secondary | ICD-10-CM | POA: Insufficient documentation

## 2023-01-05 DIAGNOSIS — R252 Cramp and spasm: Secondary | ICD-10-CM | POA: Diagnosis present

## 2023-01-05 DIAGNOSIS — M6281 Muscle weakness (generalized): Secondary | ICD-10-CM | POA: Insufficient documentation

## 2023-01-05 DIAGNOSIS — M542 Cervicalgia: Secondary | ICD-10-CM | POA: Insufficient documentation

## 2023-01-05 DIAGNOSIS — M62838 Other muscle spasm: Secondary | ICD-10-CM | POA: Insufficient documentation

## 2023-01-05 NOTE — Therapy (Signed)
OUTPATIENT PHYSICAL THERAPY FEMALE PELVIC TREATMENT   Patient Name: Hannah Woodard MRN: [id number] DOB:11/17/1970, 52 y.o., female Today's Date: 01/05/2023   PT End of Session - 01/05/23 1027     Visit Number 35    Date for PT Re-Evaluation 02/24/23    Authorization Type BCBS    Authorization Time Period 12/15/22 to 02/09/23    PT Start Time 1020    PT Stop Time 1104    PT Time Calculation (min) 44 min    Activity Tolerance Patient tolerated treatment well;No increased pain    [facility] for tasks assessed/performed                     Past Medical History:  Diagnosis Date   Breast cancer    Family history of ovarian cancer 09/02/2017   Family history of prostate cancer in father 09/02/2017   Family history of uterine cancer 09/02/2017   Plantar fasciitis    Past Surgical History:  Procedure Laterality Date   PORT-A-CATH REMOVAL Left 2020   right lumpectomy, sentinel node biopsy, and bilateral [facility]  10[facility]   Patient Active Problem List   Diagnosis Date Noted   Overweight (BMI 25.0-29.9) 12/31/2020   Hyperlipidemia 12/30/2020   Neuropathy due to chemotherapeutic drug 12/31/2017   Port-A-Cath in place 11/12/2017   Genetic testing 09/15/2017   Malignant neoplasm of upper-inner quadrant of right breast in female, estrogen receptor positive 08/27/2017   Vitamin D deficiency 12/09/2015   Medication management 12/09/2015   Dense breast tissue 12/09/2015    PCP: Dois Davenportichter, Karen L, MD  REFERRING PROVIDER: Doylene Bodeross, Melissa D, NP  REFERRING DIAG:  R35.0 (ICD-10-CM) - Urinary frequency N94.10 (ICD-10-CM) - Dyspareunia in female 55T38.6X5A (ICD-10-CM) - Adverse effect of antigonadotrophins, antiestrogens, antiandrogens, not elsewhere classified, initial encounter  THERAPY DIAG:  Muscle weakness (generalized)  Other muscle spasm  Cervicalgia  Cramp and spasm  Abnormal posture  Rationale for Evaluation and Treatment  Rehabilitation  ONSET DATE: years  SUBJECTIVE:                                                                                                                                                                                           SUBJECTIVE STATEMENT: Patient states she is tight in right side of her neck.      PAIN:  Are you having pain? No  NPRS scale: 3-4/10 Rt>Lt inner thigh Pain location:  intercourse, internally vaginally.  Pain reported in feet and thighs   PRECAUTIONS: None  WEIGHT BEARING RESTRICTIONS No  FALLS:  Has patient fallen in last 6 months? 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  PLOF: Independent  PATIENT GOALS to have less pain, more regular voids  PERTINENT HISTORY:  personal history of triple positive breast cancer at age 56 and family history of ovarian cancer, BRCA negative on genetic testing Vaginal atrophy Malignant neoplasm of upper-inner quadrant of right breast in female, estrogen receptor positive   Sexual abuse: No  BOWEL MOVEMENT Pain with bowel movement: No Type of bowel movement:Type (Bristol Stool Scale) 2-3, Frequency daily, and Strain Yes but this has improved since starting using step stool in bathroom Fully empty rectum: No Leakage: No Pads: No Fiber supplement: Yes:    URINATION Pain with urination: No sometimes, rarely will have a burning Fully empty bladder: No Stream: Strong and Weak Urgency: Yes: consistently Frequency: 2 hours sometimes sooner, usually gets up at night 1x Leakage: Coughing and Sneezing Pads: No  INTERCOURSE Pain with intercourse: Initial Penetration, During Penetration, After Intercourse, and Pain Interrupts Intercourse Ability to have vaginal penetration:  Yes:   Climax: not painful but pain prevents climax sometimes Marinoff Scale: 2/3  PREGNANCY Vaginal deliveries 0 Tearing No C-section  deliveries 0 Currently pregnant No  PROLAPSE None    OBJECTIVE:   DIAGNOSTIC FINDINGS:   COGNITION:  Overall cognitive status: Within functional limits for tasks assessed     SENSATION:  Light touch: Appears intact   Proprioception: Appears intact  MUSCLE LENGTH: Bil hamstring and adductor limited by 25%                POSTURE: rounded shoulders and forward head    LUMBARAROM/PROM  A/PROM A/PROM  eval  Flexion Limited by 25%  Extension WFL  Right lateral flexion Limited by 25%  Left lateral flexion Limited by 25%  Right rotation WFL  Left rotation WFL   (Blank rows = not tested)  LOWER EXTREMITY ROM:  Bil WFL LOWER EXTREMITY MMT:   Bil hips 4/5 grossly, knees and ankles 5/5   PALPATION:   General  no TTP, mild fascial restrictions noted in lower abdominal quadrants and over bladder                External Perineal Exam no TTP, dryness noted externally                             Internal Pelvic Floor no TTP however increased tension noted throughout  Patient confirms identification and approves PT to assess internal pelvic floor and treatment Yes  PELVIC MMT:   MMT eval 06/03/22  07/06/22  07/20/22  10/02/22  12/30/22   Vaginal 1/5 with max cues and quick release; without cues 0/5; 1s; 1 rep 1/5 initially but with quick release 2/5.   2/5 with minimal cues 2/5 - however with palpation of superficial muscles pt reported pain with palpation at bulbocavernosus bil and had several small trigger points.  3/5 2/5 due to tension   Internal Anal Sphincter        External Anal Sphincter        Puborectalis        Diastasis Recti        (Blank rows = not tested)        TONE: Mildly increased  PROLAPSE: Not seen in hooklying   TODAY'S TREATMENT  01/05/23: UBE x 5 min level 1 fwd only 3 way scapular stabilization 2 x 5 with blue loop  4 D ball rolls with yellow plyo ball x 20  Theraband (red) shoulder extension, rows, bilateral ER and bilateral horizontal  abduction x 20 each TYI's over blue physio ball x 10 with 2# Doorway stretch x 10 holding 10 sec Trigger Point Dry-Needling  Treatment instructions: Expect mild to moderate muscle soreness. S/S of pneumothorax if dry needled over a lung field, and to seek immediate medical attention should they occur. Patient verbalized understanding of these instructions and education.  Patient Consent Given: Yes Education handout provided: Yes Muscles treated: cervical multifidi bilaterally, sub occipitals bilaterally, upper traps bilaterally Electrical stimulation performed: No Parameters: N/A Treatment response/outcome: Skilled palpation used to identify taut bands and trigger points.  Once identified, dry needling techniques used to treat these areas.  Twitch response ellicited along with palpable elongation of muscle.  Following treatment, patient reports increased ROM along with mild soreness.     12/31/22: UBE x 5 min level 1 fwd only 3 way scapular stabilization 2 x 5 with green loop 4 D ball rolls with light blue plyo ball x 20  Theraband (red) shoulder extension, rows, bilateral ER and bilateral horizontal abduction x 20 each TYI's over blue physio ball x 10 with 0#, then x 5 with 0# Doorway stretch x 3 holding 10 sec Educated on how rotator cuff weakness and restriction in the right shoulder could be contributing to her neck symptoms Manual: PROM C spine, upper trap stretch, levator stretch, STM to bilateral upper traps and levator, sub occipital release x 15 min   11/17/22: Manual: Patient confirms identification and approves physical therapist to perform internal soft tissue work   Bil levators and obturators, fascial release around vaginal wall NMRE  Internal cues to exhale and engage pelvic floor Cues to breath without bulging abdomen and drawing in on the exhale Pt demonstrates 1-2/5 MMT of pelvic floor today up to 3 reps   Trigger Point Dry-Needling  Treatment instructions: Expect  mild to moderate muscle soreness. S/S of pneumothorax if dry needled over a lung field, and to seek immediate medical attention should they occur. Patient verbalized understanding of these instructions and education.  Patient Consent Given: Yes Education handout provided: Previously provided Muscles treated: lumbar multifidi, piriformis, glute medius, adductors bil Electrical stimulation performed: No Parameters: N/A Treatment response/outcome: increased soft tissue length and twitch response     PATIENT EDUCATION:  Education details: [email or web address], urge drill  Person educated: Patient Education method: Explanation, Demonstration, Tactile cues, Verbal cues, and Handouts Education comprehension: verbalized understanding and returned demonstration   HOME EXERCISE PROGRAM: Access Code: [email or web address] URL: [email or web address] Date: 12/31/2022 Prepared by: Mikey Kirschner  Exercises - Supine Diaphragmatic Breathing  - 1 x daily - 7 x weekly - 1 sets - 10 reps - Supine Pelvic Floor Contraction  - 1 x daily - 7 x weekly - 1 sets - 10 reps - Seated Table Hamstring Stretch  - 1 x daily - 7 x weekly - 1 sets - 2 reps - 30s holds - Supine Hip Adductor Stretch  - 1 x daily - 7 x weekly - 1 sets - 2 reps - 30s holds - Supine Bilateral Hip Internal Rotation Stretch  - 1 x daily - 7 x weekly - 1 sets - 2 reps - 30s holds - Tail Wag  - 1 x daily - 7 x weekly - 1 sets - 10 reps - 5s holds - Scapular Retraction with Resistance Advanced  - 2 x daily - 7 x weekly - 3 sets -  10 reps - Scapular Retraction with Resistance  - 2 x daily - 7 x weekly - 3 sets - 10 reps - Shoulder External Rotation and Scapular Retraction with Resistance  - 2 x daily - 7 x weekly - 3 sets - 10 reps - Standing Shoulder Horizontal Abduction with Resistance  - 1 x daily - 7 x weekly - 3 sets - 10 reps  ASSESSMENT:  CLINICAL IMPRESSION: Hannah Woodard is progressing appropriately.  She was able to tolerate additon of resistance  to her postural strengthening without any additional pain.  She had some noticeable twitch response in all treated muscles for DN today.  She would benefit from continuing skilled PT for postural and shoulder strengthening to reduce cervical spine pain.     OBJECTIVE IMPAIRMENTS decreased coordination, decreased endurance, decreased mobility, decreased strength, increased fascial restrictions, increased muscle spasms, impaired flexibility, improper body mechanics, postural dysfunction, and pain.   ACTIVITY LIMITATIONS continence, locomotion level, and intercourse  PARTICIPATION LIMITATIONS: interpersonal relationship and community activity  PERSONAL FACTORS Past/current experiences, Time since onset of injury/illness/exacerbation, and 1 comorbidity: medical history  are also affecting patient's functional outcome.   REHAB POTENTIAL: Good  CLINICAL DECISION MAKING: Stable/uncomplicated  EVALUATION COMPLEXITY: Low   GOALS: Goals reviewed with patient? Yes  SHORT TERM GOALS: Target date: 05/26/2022  Pt to be I with HEP.  Baseline: Goal status: MET  2.  Pt to be I with use of feminine moisturizers and lubricants as needed for improved vaginal health and decreased pain with penetration.  Baseline:  Goal status: MET  3.  Pt to demonstrated improved coordination with pelvic floor and breathing mechanics with body weight squats without leakage or pain for improved pelvic stability.  Baseline:  Goal status: MET   LONG TERM GOALS: Target date: 02/09/23  Pt to be I with advanced HEP.  Baseline:  Goal status: MET  2.  Pt to demonstrate at least 5/5 bil hip strength for improved pelvic stability and functional squats without leakage.  Baseline:  Goal status: MET  3. Pt to demonstrate at least 3/5 pelvic floor strength for improved pelvic stability and decreased strain at pelvic floor/ decrease leakage.  Baseline:  Goal status: MET  4.  Pt to report improved time between bladder  voids to at least 2.5 hours for improved QOL with decreased urinary frequency.   Baseline:  Goal status: met  5.  Pt to report no more than 2/10 pain with vaginal penetration for improved QOL and decreased pain with medical procedures. Baseline: 4/10 currently, but up to 5/10 initially  Goal status:  Ongoing  6.  Pt will be able to correctly engage core and pelvic floor in order to knack without leakage. Baseline:  Goal status: REVISED      PLAN: PT FREQUENCY: every other week  PT DURATION: 12 weeks   PLANNED INTERVENTIONS: Therapeutic exercises, Therapeutic activity, Neuromuscular re-education, Patient/Family education, Self Care, Joint mobilization, Aquatic Therapy, Dry Needling, Spinal mobilization, Cryotherapy, Moist heat, scar mobilization, Taping, Biofeedback, and Manual therapy  PLAN FOR NEXT SESSION: Progress postural and right shoulder strengthening, DN as indicated   Hannah Woodard, PT 01/05/23 11:15 AM  [facility] [address] [place], [place] [address] Phone # [phone] Fax [phone]

## 2023-01-13 ENCOUNTER — Ambulatory Visit: Payer: BC Managed Care – PPO

## 2023-01-13 DIAGNOSIS — M62838 Other muscle spasm: Secondary | ICD-10-CM

## 2023-01-13 DIAGNOSIS — R252 Cramp and spasm: Secondary | ICD-10-CM

## 2023-01-13 DIAGNOSIS — M542 Cervicalgia: Secondary | ICD-10-CM

## 2023-01-13 DIAGNOSIS — M6281 Muscle weakness (generalized): Secondary | ICD-10-CM | POA: Diagnosis not present

## 2023-01-13 NOTE — Therapy (Signed)
OUTPATIENT PHYSICAL THERAPY FEMALE PELVIC TREATMENT   Patient Name: Hannah Woodard MRN: [id number] DOB:22-Sep-1971, 52 y.o., female Today's Date: 01/13/2023   PT End of Session - 01/13/23 1501     Visit Number [id number]    Date for PT Re-Evaluation 02/24/23    Authorization Type BCBS    Authorization Time Period 12/15/22 to 02/09/23    PT Start Time 1457    PT Stop Time 1530    PT Time Calculation (min) 33 min    Activity Tolerance Patient tolerated treatment well;No increased pain    Behavior During Therapy [facility] for tasks assessed/performed                     Past Medical History:  Diagnosis Date   Breast cancer    Family history of ovarian cancer 09/02/2017   Family history of prostate cancer in father 09/02/2017   Family history of uterine cancer 09/02/2017   Plantar fasciitis    Past Surgical History:  Procedure Laterality Date   PORT-A-CATH REMOVAL Left 2020   right lumpectomy, sentinel node biopsy, and bilateral mammoplasty  10/2017   [facility]   Patient Active Problem List   Diagnosis Date Noted   Overweight (BMI 25.0-29.9) 12/31/2020   Hyperlipidemia 12/30/2020   Neuropathy due to chemotherapeutic drug 12/31/2017   Port-A-Cath in place 11/12/2017   Genetic testing 09/15/2017   Malignant neoplasm of upper-inner quadrant of right breast in female, estrogen receptor positive 08/27/2017   Vitamin D deficiency 12/09/2015   Medication management 12/09/2015   Dense breast tissue 12/09/2015    PCP: Dois Davenport, MD  REFERRING PROVIDER: Doylene Bode, NP  REFERRING DIAG:  R35.0 (ICD-10-CM) - Urinary frequency N94.10 (ICD-10-CM) - Dyspareunia in female T22.6X5A (ICD-10-CM) - Adverse effect of antigonadotrophins, antiestrogens, antiandrogens, not elsewhere classified, initial encounter  THERAPY DIAG:  Muscle weakness (generalized)  Other muscle spasm  Cervicalgia  Cramp and spasm  Rationale for Evaluation and Treatment Rehabilitation  ONSET DATE:  years  SUBJECTIVE:                                                                                                                                                                                           SUBJECTIVE STATEMENT: 15 min late for appt today.  Patient states she continues to improve.  Pain is minimal today.  "I really do well with the needling"   PAIN:  Are you having pain? No  NPRS scale: 3-4/10 Rt>Lt inner thigh Pain location:  intercourse, internally vaginally.  Pain reported in feet and thighs   PRECAUTIONS: None  WEIGHT BEARING RESTRICTIONS No  FALLS:  Has patient fallen in last 6 months? 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  PLOF: Independent  PATIENT GOALS to have less pain, more regular voids  PERTINENT HISTORY:  personal history of triple positive breast cancer at age 12 and family history of ovarian cancer, BRCA negative on genetic testing Vaginal atrophy Malignant neoplasm of upper-inner quadrant of right breast in female, estrogen receptor positive   Sexual abuse: No  BOWEL MOVEMENT Pain with bowel movement: No Type of bowel movement:Type (Bristol Stool Scale) 2-3, Frequency daily, and Strain Yes but this has improved since starting using step stool in bathroom Fully empty rectum: No Leakage: No Pads: No Fiber supplement: Yes:    URINATION Pain with urination: No sometimes, rarely will have a burning Fully empty bladder: No Stream: Strong and Weak Urgency: Yes: consistently Frequency: 2 hours sometimes sooner, usually gets up at night 1x Leakage: Coughing and Sneezing Pads: No  INTERCOURSE Pain with intercourse: Initial Penetration, During Penetration, After Intercourse, and Pain Interrupts Intercourse Ability to have vaginal penetration:  Yes:   Climax: not painful but pain prevents climax sometimes Marinoff Scale:  2/3  PREGNANCY Vaginal deliveries 0 Tearing No C-section deliveries 0 Currently pregnant No  PROLAPSE None    OBJECTIVE:   DIAGNOSTIC FINDINGS:   COGNITION:  Overall cognitive status: Within functional limits for tasks assessed     SENSATION:  Light touch: Appears intact   Proprioception: Appears intact  MUSCLE LENGTH: Bil hamstring and adductor limited by 25%                POSTURE: rounded shoulders and forward head    LUMBARAROM/PROM  A/PROM A/PROM  eval  Flexion Limited by 25%  Extension WFL  Right lateral flexion Limited by 25%  Left lateral flexion Limited by 25%  Right rotation WFL  Left rotation WFL   (Blank rows = not tested)  LOWER EXTREMITY ROM:  Bil WFL LOWER EXTREMITY MMT:   Bil hips 4/5 grossly, knees and ankles 5/5   PALPATION:   General  no TTP, mild fascial restrictions noted in lower abdominal quadrants and over bladder                External Perineal Exam no TTP, dryness noted externally                             Internal Pelvic Floor no TTP however increased tension noted throughout  Patient confirms identification and approves PT to assess internal pelvic floor and treatment Yes  PELVIC MMT:   MMT eval 06/03/22  07/06/22  07/20/22  10/02/22  12/30/22   Vaginal 1/5 with max cues and quick release; without cues 0/5; 1s; 1 rep 1/5 initially but with quick release 2/5.   2/5 with minimal cues 2/5 - however with palpation of superficial muscles pt reported pain with palpation at bulbocavernosus bil and had several small trigger points.  3/5 2/5 due to tension   Internal Anal Sphincter        External Anal Sphincter        Puborectalis        Diastasis Recti        (Blank rows = not tested)        TONE: Mildly increased  PROLAPSE: Not seen in hooklying   TODAY'S TREATMENT  01/13/23: UBE x 5 min level 1 fwd only 3  way scapular stabilization 2 x 5 with blue loop 4 D ball rolls with yellow plyo ball x 20  Theraband (green)  shoulder extension, rows, bilateral ER and bilateral horizontal abduction x 20 each Doorway stretch x 10 holding 10 sec Trigger Point Dry-Needling  Treatment instructions: Expect mild to moderate muscle soreness. S/S of pneumothorax if dry needled over a lung field, and to seek immediate medical attention should they occur. Patient verbalized understanding of these instructions and education.  Patient Consent Given: Yes Education handout provided: Yes Muscles treated: cervical multifidi bilaterally, sub occipitals bilaterally, upper traps bilaterally Electrical stimulation performed: No Parameters: N/A Treatment response/outcome: Skilled palpation used to identify taut bands and trigger points.  Once identified, dry needling techniques used to treat these areas.  Twitch response ellicited along with palpable elongation of muscle.  Following treatment, patient reports increased ROM along with mild soreness.     01/05/23: UBE x 5 min level 1 fwd only 3 way scapular stabilization 2 x 5 with blue loop 4 D ball rolls with yellow plyo ball x 20  Theraband (red) shoulder extension, rows, bilateral ER and bilateral horizontal abduction x 20 each TYI's over blue physio ball x 10 with 2# Doorway stretch x 10 holding 10 sec Trigger Point Dry-Needling  Treatment instructions: Expect mild to moderate muscle soreness. S/S of pneumothorax if dry needled over a lung field, and to seek immediate medical attention should they occur. Patient verbalized understanding of these instructions and education.  Patient Consent Given: Yes Education handout provided: Yes Muscles treated: cervical multifidi bilaterally, sub occipitals bilaterally, upper traps bilaterally Electrical stimulation performed: No Parameters: N/A Treatment response/outcome: Skilled palpation used to identify taut bands and trigger points.  Once identified, dry needling techniques used to treat these areas.  Twitch response ellicited along with  palpable elongation of muscle.  Following treatment, patient reports increased ROM along with mild soreness.     12/31/22: UBE x 5 min level 1 fwd only 3 way scapular stabilization 2 x 5 with green loop 4 D ball rolls with light blue plyo ball x 20  Theraband (red) shoulder extension, rows, bilateral ER and bilateral horizontal abduction x 20 each TYI's over blue physio ball x 10 with 0#, then x 5 with 0# Doorway stretch x 3 holding 10 sec Educated on how rotator cuff weakness and restriction in the right shoulder could be contributing to her neck symptoms Manual: PROM C spine, upper trap stretch, levator stretch, STM to bilateral upper traps and levator, sub occipital release x 15 min   11/17/22: Manual: Patient confirms identification and approves physical therapist to perform internal soft tissue work   Bil levators and obturators, fascial release around vaginal wall NMRE  Internal cues to exhale and engage pelvic floor Cues to breath without bulging abdomen and drawing in on the exhale Pt demonstrates 1-2/5 MMT of pelvic floor today up to 3 reps   Trigger Point Dry-Needling  Treatment instructions: Expect mild to moderate muscle soreness. S/S of pneumothorax if dry needled over a lung field, and to seek immediate medical attention should they occur. Patient verbalized understanding of these instructions and education.  Patient Consent Given: Yes Education handout provided: Previously provided Muscles treated: lumbar multifidi, piriformis, glute medius, adductors bil Electrical stimulation performed: No Parameters: N/A Treatment response/outcome: increased soft tissue length and twitch response     PATIENT EDUCATION:  Education details: [email or web address], urge drill  Person educated: Patient Education method: Explanation, Demonstration, Tactile cues, Verbal cues, and Handouts Education  comprehension: verbalized understanding and returned demonstration   HOME EXERCISE PROGRAM: Access  Code: [email or web address] URL: [email or web address] Date: 12/31/2022 Prepared by: Mikey Kirschner  Exercises - Supine Diaphragmatic Breathing  - 1 x daily - 7 x weekly - 1 sets - 10 reps - Supine Pelvic Floor Contraction  - 1 x daily - 7 x weekly - 1 sets - 10 reps - Seated Table Hamstring Stretch  - 1 x daily - 7 x weekly - 1 sets - 2 reps - 30s holds - Supine Hip Adductor Stretch  - 1 x daily - 7 x weekly - 1 sets - 2 reps - 30s holds - Supine Bilateral Hip Internal Rotation Stretch  - 1 x daily - 7 x weekly - 1 sets - 2 reps - 30s holds - Tail Wag  - 1 x daily - 7 x weekly - 1 sets - 10 reps - 5s holds - Scapular Retraction with Resistance Advanced  - 2 x daily - 7 x weekly - 3 sets - 10 reps - Scapular Retraction with Resistance  - 2 x daily - 7 x weekly - 3 sets - 10 reps - Shoulder External Rotation and Scapular Retraction with Resistance  - 2 x daily - 7 x weekly - 3 sets - 10 reps - Standing Shoulder Horizontal Abduction with Resistance  - 1 x daily - 7 x weekly - 3 sets - 10 reps  ASSESSMENT:  CLINICAL IMPRESSION: Boneta Lucks was late for appt today but we were able to do some postural and shoulder strengthening with increased resistance.  She responds very well to DN and we had several twitch responses once again today.    She would benefit from continuing skilled PT for postural and shoulder strengthening to reduce cervical spine pain.     OBJECTIVE IMPAIRMENTS decreased coordination, decreased endurance, decreased mobility, decreased strength, increased fascial restrictions, increased muscle spasms, impaired flexibility, improper body mechanics, postural dysfunction, and pain.   ACTIVITY LIMITATIONS continence, locomotion level, and intercourse  PARTICIPATION LIMITATIONS: interpersonal relationship and community activity  PERSONAL FACTORS Past/current experiences, Time since onset of injury/illness/exacerbation, and 1 comorbidity: medical history  are also affecting patient's  functional outcome.   REHAB POTENTIAL: Good  CLINICAL DECISION MAKING: Stable/uncomplicated  EVALUATION COMPLEXITY: Low   GOALS: Goals reviewed with patient? Yes  SHORT TERM GOALS: Target date: 05/26/2022  Pt to be I with HEP.  Baseline: Goal status: MET  2.  Pt to be I with use of feminine moisturizers and lubricants as needed for improved vaginal health and decreased pain with penetration.  Baseline:  Goal status: MET  3.  Pt to demonstrated improved coordination with pelvic floor and breathing mechanics with body weight squats without leakage or pain for improved pelvic stability.  Baseline:  Goal status: MET   LONG TERM GOALS: Target date: 02/09/23  Pt to be I with advanced HEP.  Baseline:  Goal status: MET  2.  Pt to demonstrate at least 5/5 bil hip strength for improved pelvic stability and functional squats without leakage.  Baseline:  Goal status: MET  3. Pt to demonstrate at least 3/5 pelvic floor strength for improved pelvic stability and decreased strain at pelvic floor/ decrease leakage.  Baseline:  Goal status: MET  4.  Pt to report improved time between bladder voids to at least 2.5 hours for improved QOL with decreased urinary frequency.   Baseline:  Goal status: met  5.  Pt to report no more than 2/10 pain with vaginal penetration for  improved QOL and decreased pain with medical procedures. Baseline: 4/10 currently, but up to 5/10 initially  Goal status:  Ongoing  6.  Pt will be able to correctly engage core and pelvic floor in order to knack without leakage. Baseline:  Goal status: REVISED      PLAN: PT FREQUENCY: every other week  PT DURATION: 12 weeks   PLANNED INTERVENTIONS: Therapeutic exercises, Therapeutic activity, Neuromuscular re-education, Patient/Family education, Self Care, Joint mobilization, Aquatic Therapy, Dry Needling, Spinal mobilization, Cryotherapy, Moist heat, scar mobilization, Taping, Biofeedback, and Manual  therapy  PLAN FOR NEXT SESSION: Scapular stabilization, rhomboid and shoulder ER training for posture, Progress postural and right shoulder strengthening, DN as indicated   Rainy B. Jaycie Kregel, PT 01/13/23 5:21 PM  [facility] [address] [place], [place] [address] Phone # [phone] Fax [phone]

## 2023-01-18 ENCOUNTER — Ambulatory Visit
Admission: EM | Admit: 2023-01-18 | Discharge: 2023-01-18 | Disposition: A | Discharge status: Home / Self Care | Payer: BC Managed Care – PPO

## 2023-01-18 DIAGNOSIS — Z789 Other specified health status: Secondary | ICD-10-CM | POA: Diagnosis not present

## 2023-01-18 NOTE — Discharge Instructions (Signed)
You do not have hand, foot, and mouth disease today - but the incubation period is 4-7 days, so you might develop symptoms later if you have been exposed.

## 2023-01-18 NOTE — ED Triage Notes (Signed)
Pt c/o spots on their legs. Concerned exposure to hand foot and mouth. First noticed last night.

## 2023-01-18 NOTE — ED Provider Notes (Signed)
[facility]    CSN: [id number] Arrival date & time: 01/18/23  4098      History   Chief Complaint Chief Complaint  Patient presents with   Rash    HPI Hannah Woodard is a 52 y.o. female. Pt has a rash on her L lateral thigh and works at an [employer] where a child she is sometimes around was out last week for hand, foot and mouth. She is worried she may have it and wants testing. No lesions on soles of feet, palms of hand, or in mouth.    Rash   Past Medical History:  Diagnosis Date   Breast cancer    Family history of ovarian cancer 09/02/2017   Family history of prostate cancer in father 09/02/2017   Family history of uterine cancer 09/02/2017   Plantar fasciitis     Patient Active Problem List   Diagnosis Date Noted   Overweight (BMI 25.0-29.9) 12/31/2020   Hyperlipidemia 12/30/2020   Neuropathy due to chemotherapeutic drug 12/31/2017   Port-A-Cath in place 11/12/2017   Genetic testing 09/15/2017   Malignant neoplasm of upper-inner quadrant of right breast in female, estrogen receptor positive 08/27/2017   Vitamin D deficiency 12/09/2015   Medication management 12/09/2015   Dense breast tissue 12/09/2015    Past Surgical History:  Procedure Laterality Date   PORT-A-CATH REMOVAL Left 2020   right lumpectomy, sentinel node biopsy, and bilateral mammoplasty  10/2017   [facility]    OB History   No obstetric history on file.      Home Medications    Prior to Admission medications   Medication Sig Start Date End Date Taking? Authorizing Provider  anastrozole (ARIMIDEX) 1 MG tablet Take 1 tablet (1 mg total) by mouth daily. 07/22/22   Rachel Moulds, MD  ascorbic acid (VITAMIN C) 1000 MG tablet Take by mouth.    [provider]  b complex vitamins tablet Take 1 tablet by mouth daily.    [provider]  Bioflavonoid Products (QUERCETIN COMPLEX IMMUNE PO) Take by mouth. W/ bromelain    [provider]   Cholecalciferol (VITAMIN D3) 5000 units TABS Take 10,000 Units by mouth daily.    [provider]  COLLAGEN PO Take by mouth daily. Takes beef collagen    [provider]  desoximetasone (TOPICORT) 0.25 % cream Apply topically daily as needed. 05/06/20   [provider]  fish oil-omega-3 fatty acids 1000 MG capsule Take 1 g by mouth daily.    [provider]  gabapentin (NEURONTIN) 300 MG capsule Take 1 capsule (300 mg total) by mouth at bedtime. 06/22/22   Rachel Moulds, MD  glucosamine-chondroitin 500-400 MG tablet Take 3 tablets by mouth daily.    [provider]  [place] Si Tea 200 MG CAPS Take 1 capsule by mouth daily in the afternoon.    [provider]  lactobacillus acidophilus (BACID) TABS tablet Take 2 tablets by mouth 3 (three) times daily.    [provider]  Magnesium Citrate POWD Take by mouth daily.    [provider]  Menaquinone-7 (VITAMIN K2 PO) Take 150 mcg by mouth daily.    [provider]  montelukast (SINGULAIR) 10 MG tablet TAKE 1 TABLET (10 MG) BY MOUTH EVERY EVENING 04/10/21   [provider]  NEOMYCIN-POLYMYXIN-HYDROCORTISONE (CORTISPORIN) 1 % SOLN OTIC solution 4 drops 3 (three) times daily. 04/21/22   [provider]  ondansetron (ZOFRAN-ODT) 4 MG disintegrating tablet TAKE 1 TABLET BY MOUTH  FOUR TIMES A DAY AS NEEDED FOR NAUSEA 03/29/21   [provider]  senna (SENOKOT) 8.6 MG tablet Take 1 tablet by mouth at bedtime as needed for constipation.    [provider]  triamcinolone ointment (KENALOG) 0.1 % Apply 1 application topically 2 (two) times daily. 12/27/19   Doree Albee, PA-C  TURMERIC PO Take 1 capsule by mouth as directed.     [provider]  UNABLE TO FIND Take 500 mg by mouth daily. [place] Geographical information systems officer, Historical, MD  UNABLE TO FIND Take 500 mg by mouth daily. [place] Mushroom    [provider]  prochlorperazine  (COMPAZINE) 10 MG tablet Take 1 tablet (10 mg total) by mouth every 6 (six) hours as needed (Nausea or vomiting). 10/27/17 12/27/19  Magrinat, Valentino Hue, MD    Family History Family History  Problem Relation Age of Onset   Ovarian cancer Mother 25   Prostate cancer Father 31       'high gleason' unsure number   Ulcerative colitis Sister    Other Sister        abn ovaian growth, partial hysterectomy   Basal cell carcinoma Sister 44   Uterine cancer Maternal Aunt 55   Stroke Maternal Aunt 66   Basal cell carcinoma Brother    Skin cancer Paternal Uncle    Skin cancer Maternal Grandmother    Stroke Paternal Grandfather 3   Hydrocephalus Cousin     Social History Social History   Tobacco Use   Smoking status: Never   Smokeless tobacco: Never  Vaping Use   Vaping Use: Never used  Substance Use Topics   Alcohol use: Not Currently    Comment: rarely    Drug use: No     Allergies   Sulfamethoxazole-trimethoprim, Adhesive [tape], Milk-related compounds, Other, and Poison ivy extract [poison ivy extract]   Review of Systems Review of Systems  Skin:  Positive for rash.     Physical Exam Triage Vital Signs ED Triage Vitals [01/18/23 1009]  Enc Vitals Group     BP 122/82     Pulse Rate 84     Resp 16     Temp 98.5 F (36.9 C)     Temp Source Oral     SpO2 95 %     Weight      Height      Head Circumference      Peak Flow      Pain Score 0     Pain Loc      Pain Edu?      Excl. in GC?    No data found.  Updated Vital Signs BP 122/82 (BP Location: Left Arm)   Pulse 84   Temp 98.5 F (36.9 C) (Oral)   Resp 16   LMP 10/05/2017   SpO2 95%   Visual Acuity Right Eye Distance:   Left Eye Distance:   Bilateral Distance:    Right Eye Near:   Left Eye Near:    Bilateral Near:     Physical Exam Constitutional:      Appearance: Normal appearance. She is not ill-appearing.  Pulmonary:     Effort: Pulmonary effort is normal.  Skin:    General: Skin is  warm and dry.     Findings: No abrasion, bruising, erythema or rash.     Comments: Pt has one small ~75mm in diameter red lesion on L lateral thigh that appears to be a blood  blister - otherwise no rash or lesion on skin in area of pt's concern. No lesions on palms of hand.   Neurological:     Mental Status: She is alert.      UC Treatments / Results  Labs (all labs ordered are listed, but only abnormal results are displayed) Labs Reviewed - No data to display  EKG   Radiology No results found.  Procedures Procedures (including critical care time)  Medications Ordered in UC Medications - No data to display  Initial Impression / Assessment and Plan / UC Course  I have reviewed the triage vital signs and the nursing notes.  Pertinent labs & imaging results that were available during my care of the patient were reviewed by me and considered in my medical decision making (see chart for details).    As pt reported no lesions or sores in mouth and none on soles of feet, I didn't inspect these areas. Discussed HFM with pt and reassured she does not have symptoms of it at this time. Explained we do not typically test for asymptomatic HFM   Final Clinical Impressions(s) / UC Diagnoses   Final diagnoses:  Normal skin appearance     Discharge Instructions      You do not have hand, foot, and mouth disease today - but the incubation period is 4-7 days, so you might develop symptoms later if you have been exposed.    ED Prescriptions   None    PDMP not reviewed this encounter.   Cathlyn Parsons, NP 01/18/23 1034

## 2023-01-20 ENCOUNTER — Inpatient Hospital Stay: Payer: BC Managed Care – PPO | Attending: Hematology and Oncology

## 2023-01-20 ENCOUNTER — Other Ambulatory Visit: Payer: Self-pay

## 2023-01-20 VITALS — BP 144/85 | HR 85 | Temp 97.9°F | Resp 20

## 2023-01-20 DIAGNOSIS — Z5111 Encounter for antineoplastic chemotherapy: Secondary | ICD-10-CM | POA: Insufficient documentation

## 2023-01-20 DIAGNOSIS — C50211 Malignant neoplasm of upper-inner quadrant of right female breast: Secondary | ICD-10-CM | POA: Insufficient documentation

## 2023-01-20 DIAGNOSIS — Z170 Estrogen receptor positive status [ER+]: Secondary | ICD-10-CM | POA: Diagnosis not present

## 2023-01-20 DIAGNOSIS — Z95828 Presence of other vascular implants and grafts: Secondary | ICD-10-CM

## 2023-01-20 MED ORDER — GOSERELIN ACETATE 10.8 MG ~~LOC~~ IMPL
10.8000 mg | DRUG_IMPLANT | Freq: Once | SUBCUTANEOUS | Status: AC
  Administered 2023-01-20: 10.8 mg via SUBCUTANEOUS
  Filled 2023-01-20: qty 10.8

## 2023-01-25 ENCOUNTER — Telehealth: Payer: Self-pay | Admitting: Physical Therapy

## 2023-01-25 ENCOUNTER — Ambulatory Visit: Payer: BC Managed Care – PPO | Admitting: Physical Therapy

## 2023-01-25 NOTE — Telephone Encounter (Signed)
PT called pt about today's appointment at 1530. Pt reports she forgot about appointment and will be here at next appointment.   Otelia Sergeant, PT, DPT 04/29/244:04 PM

## 2023-01-27 ENCOUNTER — Ambulatory Visit: Payer: BC Managed Care – PPO | Attending: Physical Medicine and Rehabilitation

## 2023-01-27 DIAGNOSIS — M62838 Other muscle spasm: Secondary | ICD-10-CM | POA: Insufficient documentation

## 2023-01-27 DIAGNOSIS — M6281 Muscle weakness (generalized): Secondary | ICD-10-CM | POA: Diagnosis present

## 2023-01-27 DIAGNOSIS — M542 Cervicalgia: Secondary | ICD-10-CM | POA: Diagnosis present

## 2023-01-27 DIAGNOSIS — R252 Cramp and spasm: Secondary | ICD-10-CM

## 2023-01-27 DIAGNOSIS — R293 Abnormal posture: Secondary | ICD-10-CM | POA: Insufficient documentation

## 2023-01-27 NOTE — Therapy (Signed)
OUTPATIENT PHYSICAL THERAPY FEMALE PELVIC TREATMENT   Patient Name: Hannah Woodard MRN: [id number] DOB:07-02-1971, 52 y.o., female Today's Date: 01/27/2023   PT End of Session - 01/27/23 1536     Visit Number [id number]    Date for PT Re-Evaluation 02/24/23    Authorization Type [employer]    Authorization Time Period 12/15/22 to 02/09/23    PT Start Time 1536    PT Stop Time 1615    PT Time Calculation (min) 39 min    Activity Tolerance Patient tolerated treatment well;No increased pain    Behavior During Therapy [facility] for tasks assessed/performed                     Past Medical History:  Diagnosis Date   Breast cancer (HCC)    Family history of ovarian cancer 09/02/2017   Family history of prostate cancer in father 09/02/2017   Family history of uterine cancer 09/02/2017   Plantar fasciitis    Past Surgical History:  Procedure Laterality Date   PORT-A-CATH REMOVAL Left 2020   right lumpectomy, sentinel node biopsy, and bilateral mammoplasty  10/2017   [facility]   Patient Active Problem List   Diagnosis Date Noted   Overweight (BMI 25.0-29.9) 12/31/2020   Hyperlipidemia 12/30/2020   Neuropathy due to chemotherapeutic drug (HCC) 12/31/2017   Port-A-Cath in place 11/12/2017   Genetic testing 09/15/2017   Malignant neoplasm of upper-inner quadrant of right breast in female, estrogen receptor positive (HCC) 08/27/2017   Vitamin D deficiency 12/09/2015   Medication management 12/09/2015   Dense breast tissue 12/09/2015    PCP: Dois Davenport, MD  REFERRING PROVIDER: Doylene Bode, NP  REFERRING DIAG:  R35.0 (ICD-10-CM) - Urinary frequency N94.10 (ICD-10-CM) - Dyspareunia in female T55.6X5A (ICD-10-CM) - Adverse effect of antigonadotrophins, antiestrogens, antiandrogens, not elsewhere classified, initial encounter  THERAPY DIAG:  Muscle weakness (generalized)  Other muscle spasm  Cervicalgia  Cramp and spasm  Abnormal posture  Rationale for Evaluation and  Treatment Rehabilitation  ONSET DATE: years  SUBJECTIVE:                                                                                                                                                                                           SUBJECTIVE STATEMENT: Patient reports "my hips and legs are hurting" No headaches since last session.    PAIN:  Are you having pain? No  NPRS scale: 3-4/10 Rt>Lt inner thigh Pain location:  intercourse, internally vaginally.  Pain reported in feet and thighs   PRECAUTIONS: None  WEIGHT BEARING RESTRICTIONS No  FALLS:  Has patient fallen in last 6  months? 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  PLOF: Independent  PATIENT GOALS to have less pain, more regular voids  PERTINENT HISTORY:  personal history of triple positive breast cancer at age 6 and family history of ovarian cancer, BRCA negative on genetic testing Vaginal atrophy Malignant neoplasm of upper-inner quadrant of right breast in female, estrogen receptor positive   Sexual abuse: No  BOWEL MOVEMENT Pain with bowel movement: No Type of bowel movement:Type (Bristol Stool Scale) 2-3, Frequency daily, and Strain Yes but this has improved since starting using step stool in bathroom Fully empty rectum: No Leakage: No Pads: No Fiber supplement: Yes:    URINATION Pain with urination: No sometimes, rarely will have a burning Fully empty bladder: No Stream: Strong and Weak Urgency: Yes: consistently Frequency: 2 hours sometimes sooner, usually gets up at night 1x Leakage: Coughing and Sneezing Pads: No  INTERCOURSE Pain with intercourse: Initial Penetration, During Penetration, After Intercourse, and Pain Interrupts Intercourse Ability to have vaginal penetration:  Yes:   Climax: not painful but pain prevents climax sometimes Marinoff Scale: 2/3  PREGNANCY Vaginal  deliveries 0 Tearing No C-section deliveries 0 Currently pregnant No  PROLAPSE None    OBJECTIVE:   DIAGNOSTIC FINDINGS:   COGNITION:  Overall cognitive status: Within functional limits for tasks assessed     SENSATION:  Light touch: Appears intact   Proprioception: Appears intact  MUSCLE LENGTH: Bil hamstring and adductor limited by 25%                POSTURE: rounded shoulders and forward head    LUMBARAROM/PROM  A/PROM A/PROM  eval  Flexion Limited by 25%  Extension WFL  Right lateral flexion Limited by 25%  Left lateral flexion Limited by 25%  Right rotation WFL  Left rotation WFL   (Blank rows = not tested)  LOWER EXTREMITY ROM:  Bil WFL LOWER EXTREMITY MMT:   Bil hips 4/5 grossly, knees and ankles 5/5   PALPATION:   General  no TTP, mild fascial restrictions noted in lower abdominal quadrants and over bladder                External Perineal Exam no TTP, dryness noted externally                             Internal Pelvic Floor no TTP however increased tension noted throughout  Patient confirms identification and approves PT to assess internal pelvic floor and treatment Yes  PELVIC MMT:   MMT eval 06/03/22  07/06/22  07/20/22  10/02/22  12/30/22   Vaginal 1/5 with max cues and quick release; without cues 0/5; 1s; 1 rep 1/5 initially but with quick release 2/5.   2/5 with minimal cues 2/5 - however with palpation of superficial muscles pt reported pain with palpation at bulbocavernosus bil and had several small trigger points.  3/5 2/5 due to tension   Internal Anal Sphincter        External Anal Sphincter        Puborectalis        Diastasis Recti        (Blank rows = not tested)        TONE: Mildly increased  PROLAPSE: Not seen in hooklying   TODAY'S TREATMENT  01/27/23: UBE x 5 min level 1 fwd only 3 way scapular stabilization 2 x 5  with blue loop 4 D ball rolls with yellow plyo ball x 20  Theraband (green) shoulder extension, rows,  bilateral ER and bilateral horizontal abduction x 20 each Doorway stretch x 10 holding 10 sec Trigger Point Dry-Needling  Treatment instructions: Expect mild to moderate muscle soreness. S/S of pneumothorax if dry needled over a lung field, and to seek immediate medical attention should they occur. Patient verbalized understanding of these instructions and education. Patient Consent Given: Yes Education handout provided: Yes Muscles treated: cervical multifidi bilaterally, sub occipitals bilaterally, upper traps bilaterally Electrical stimulation performed: No Parameters: N/A Treatment response/outcome: Skilled palpation used to identify taut bands and trigger points.  Once identified, dry needling techniques used to treat these areas.  Twitch response ellicited along with palpable elongation of muscle.  Following treatment, patient reports increased ROM along with mild soreness.     01/13/23: UBE x 5 min level 1 fwd only 3 way scapular stabilization 2 x 5 with blue loop 4 D ball rolls with yellow plyo ball x 20  Theraband (green) shoulder extension, rows, bilateral ER and bilateral horizontal abduction x 20 each Doorway stretch x 10 holding 10 sec Trigger Point Dry-Needling  Treatment instructions: Expect mild to moderate muscle soreness. S/S of pneumothorax if dry needled over a lung field, and to seek immediate medical attention should they occur. Patient verbalized understanding of these instructions and education.  Patient Consent Given: Yes Education handout provided: Yes Muscles treated: cervical multifidi bilaterally, sub occipitals bilaterally, upper traps bilaterally Electrical stimulation performed: No Parameters: N/A Treatment response/outcome: Skilled palpation used to identify taut bands and trigger points.  Once identified, dry needling techniques used to treat these areas.  Twitch response ellicited along with palpable elongation of muscle.  Following treatment, patient  reports increased ROM along with mild soreness.     01/05/23: UBE x 5 min level 1 fwd only 3 way scapular stabilization 2 x 5 with blue loop 4 D ball rolls with yellow plyo ball x 20  Theraband (red) shoulder extension, rows, bilateral ER and bilateral horizontal abduction x 20 each TYI's over blue physio ball x 10 with 2# Doorway stretch x 10 holding 10 sec Trigger Point Dry-Needling  Treatment instructions: Expect mild to moderate muscle soreness. S/S of pneumothorax if dry needled over a lung field, and to seek immediate medical attention should they occur. Patient verbalized understanding of these instructions and education.  Patient Consent Given: Yes Education handout provided: Yes Muscles treated: cervical multifidi bilaterally, sub occipitals bilaterally, upper traps bilaterally Electrical stimulation performed: No Parameters: N/A Treatment response/outcome: Skilled palpation used to identify taut bands and trigger points.  Once identified, dry needling techniques used to treat these areas.  Twitch response ellicited along with palpable elongation of muscle.  Following treatment, patient reports increased ROM along with mild soreness.       PATIENT EDUCATION:  Education details: [id number], urge drill  Person educated: Patient Education method: Explanation, Demonstration, Tactile cues, Verbal cues, and Handouts Education comprehension: verbalized understanding and returned demonstration   HOME EXERCISE PROGRAM: Access Code: [email or web address] URL: [email or web address] Date: 12/31/2022 Prepared by: Mikey Kirschner  Exercises - Supine Diaphragmatic Breathing  - 1 x daily - 7 x weekly - 1 sets - 10 reps - Supine Pelvic Floor Contraction  - 1 x daily - 7 x weekly - 1 sets - 10 reps - Seated Table Hamstring Stretch  - 1 x daily - 7 x weekly - 1 sets - 2 reps - 30s holds - Supine Hip  Adductor Stretch  - 1 x daily - 7 x weekly - 1 sets - 2 reps - 30s holds - Supine Bilateral  Hip Internal Rotation Stretch  - 1 x daily - 7 x weekly - 1 sets - 2 reps - 30s holds - Tail Wag  - 1 x daily - 7 x weekly - 1 sets - 10 reps - 5s holds - Scapular Retraction with Resistance Advanced  - 2 x daily - 7 x weekly - 3 sets - 10 reps - Scapular Retraction with Resistance  - 2 x daily - 7 x weekly - 3 sets - 10 reps - Shoulder External Rotation and Scapular Retraction with Resistance  - 2 x daily - 7 x weekly - 3 sets - 10 reps - Standing Shoulder Horizontal Abduction with Resistance  - 1 x daily - 7 x weekly - 3 sets - 10 reps  ASSESSMENT:  CLINICAL IMPRESSION: Boneta Lucks responds very well to DN.  She had good twitch responses in bilateral upper traps.  She tends to hurry through her exercises compromising technique at times.  Posture is improving.   She would benefit from continuing skilled PT for postural and shoulder strengthening to reduce cervical spine pain.     OBJECTIVE IMPAIRMENTS decreased coordination, decreased endurance, decreased mobility, decreased strength, increased fascial restrictions, increased muscle spasms, impaired flexibility, improper body mechanics, postural dysfunction, and pain.   ACTIVITY LIMITATIONS continence, locomotion level, and intercourse  PARTICIPATION LIMITATIONS: interpersonal relationship and community activity  PERSONAL FACTORS Past/current experiences, Time since onset of injury/illness/exacerbation, and 1 comorbidity: medical history  are also affecting patient's functional outcome.   REHAB POTENTIAL: Good  CLINICAL DECISION MAKING: Stable/uncomplicated  EVALUATION COMPLEXITY: Low   GOALS: Goals reviewed with patient? Yes  SHORT TERM GOALS: Target date: 05/26/2022  Pt to be I with HEP.  Baseline: Goal status: MET  2.  Pt to be I with use of feminine moisturizers and lubricants as needed for improved vaginal health and decreased pain with penetration.  Baseline:  Goal status: MET  3.  Pt to demonstrated improved coordination with  pelvic floor and breathing mechanics with body weight squats without leakage or pain for improved pelvic stability.  Baseline:  Goal status: MET   LONG TERM GOALS: Target date: 02/09/23  Pt to be I with advanced HEP.  Baseline:  Goal status: MET  2.  Pt to demonstrate at least 5/5 bil hip strength for improved pelvic stability and functional squats without leakage.  Baseline:  Goal status: MET  3. Pt to demonstrate at least 3/5 pelvic floor strength for improved pelvic stability and decreased strain at pelvic floor/ decrease leakage.  Baseline:  Goal status: MET  4.  Pt to report improved time between bladder voids to at least 2.5 hours for improved QOL with decreased urinary frequency.   Baseline:  Goal status: met  5.  Pt to report no more than 2/10 pain with vaginal penetration for improved QOL and decreased pain with medical procedures. Baseline: 4/10 currently, but up to 5/10 initially  Goal status:  Ongoing  6.  Pt will be able to correctly engage core and pelvic floor in order to knack without leakage. Baseline:  Goal status: REVISED      PLAN: PT FREQUENCY: every other week  PT DURATION: 12 weeks   PLANNED INTERVENTIONS: Therapeutic exercises, Therapeutic activity, Neuromuscular re-education, Patient/Family education, Self Care, Joint mobilization, Aquatic Therapy, Dry Needling, Spinal mobilization, Cryotherapy, Moist heat, scar mobilization, Taping, Biofeedback, and Manual therapy  PLAN FOR NEXT SESSION: Scapular stabilization, rhomboid and shoulder ER training for posture, Progress postural and right shoulder strengthening, DN as indicated   Gaile B. Bernal Luhman, PT 01/27/23 4:44 PM  [facility] [address] [place], [place] [address] Phone # [phone] Fax [phone]

## 2023-02-08 ENCOUNTER — Ambulatory Visit: Payer: BC Managed Care – PPO | Attending: Physical Medicine and Rehabilitation | Admitting: Physical Therapy

## 2023-02-08 DIAGNOSIS — R293 Abnormal posture: Secondary | ICD-10-CM

## 2023-02-08 DIAGNOSIS — M6281 Muscle weakness (generalized): Secondary | ICD-10-CM

## 2023-02-08 DIAGNOSIS — R279 Unspecified lack of coordination: Secondary | ICD-10-CM | POA: Diagnosis present

## 2023-02-08 NOTE — Therapy (Signed)
OUTPATIENT PHYSICAL THERAPY FEMALE PELVIC TREATMENT   Patient Name: Hannah Woodard MRN: [id number] DOB:1971-07-25, 52 y.o., female Today's Date: 02/08/2023   PT End of Session - 02/08/23 1539     Visit Number 38    Date for PT Re-Evaluation 02/24/23    Authorization Type [employer]    Authorization Time Period 12/15/22 to 02/09/23    PT Start Time 1536   pt arrival time   PT Stop Time 1614    PT Time Calculation (min) 38 min    Activity Tolerance Patient tolerated treatment well;No increased pain    Behavior During Therapy [facility] for tasks assessed/performed                     Past Medical History:  Diagnosis Date   Breast cancer (HCC)    Family history of ovarian cancer 09/02/2017   Family history of prostate cancer in father 09/02/2017   Family history of uterine cancer 09/02/2017   Plantar fasciitis    Past Surgical History:  Procedure Laterality Date   PORT-A-CATH REMOVAL Left 2020   right lumpectomy, sentinel node biopsy, and bilateral mammoplasty  10/2017   [facility]   Patient Active Problem List   Diagnosis Date Noted   Overweight (BMI 25.0-29.9) 12/31/2020   Hyperlipidemia 12/30/2020   Neuropathy due to chemotherapeutic drug (HCC) 12/31/2017   Port-A-Cath in place 11/12/2017   Genetic testing 09/15/2017   Malignant neoplasm of upper-inner quadrant of right breast in female, estrogen receptor positive (HCC) 08/27/2017   Vitamin D deficiency 12/09/2015   Medication management 12/09/2015   Dense breast tissue 12/09/2015    PCP: Dois Davenport, MD  REFERRING PROVIDER: Doylene Bode, NP  REFERRING DIAG:  R35.0 (ICD-10-CM) - Urinary frequency N94.10 (ICD-10-CM) - Dyspareunia in female T36.6X5A (ICD-10-CM) - Adverse effect of antigonadotrophins, antiestrogens, antiandrogens, not elsewhere classified, initial encounter  THERAPY DIAG:  Muscle weakness (generalized)  Abnormal posture  Unspecified lack of coordination  Rationale for Evaluation and  Treatment Rehabilitation  ONSET DATE: years  SUBJECTIVE:                                                                                                                                                                                           SUBJECTIVE STATEMENT: Pt reports she hasn't been using dilators lately but does use a finger for internal stretching, is usually moisturizer daily. Has been doing HEP for hips.     PAIN:  Are you having pain? Yes  NPRS scale: 2/4 Pain location:  internally vaginally.  With dryness  PRECAUTIONS: None  WEIGHT BEARING RESTRICTIONS No  FALLS:  Has patient  fallen in last 6 months? 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  PLOF: Independent  PATIENT GOALS to have less pain, more regular voids  PERTINENT HISTORY:  personal history of triple positive breast cancer at age 78 and family history of ovarian cancer, BRCA negative on genetic testing Vaginal atrophy Malignant neoplasm of upper-inner quadrant of right breast in female, estrogen receptor positive   Sexual abuse: No  BOWEL MOVEMENT Pain with bowel movement: No Type of bowel movement:Type (Bristol Stool Scale) 2-3, Frequency daily, and Strain Yes but this has improved since starting using step stool in bathroom Fully empty rectum: No Leakage: No Pads: No Fiber supplement: Yes:    URINATION Pain with urination: No sometimes, rarely will have a burning Fully empty bladder: No Stream: Strong and Weak Urgency: Yes: consistently Frequency: 2 hours sometimes sooner, usually gets up at night 1x Leakage: Coughing and Sneezing Pads: No  INTERCOURSE Pain with intercourse: Initial Penetration, During Penetration, After Intercourse, and Pain Interrupts Intercourse Ability to have vaginal penetration:  Yes:   Climax: not painful but pain prevents climax sometimes Marinoff Scale:  2/3  PREGNANCY Vaginal deliveries 0 Tearing No C-section deliveries 0 Currently pregnant No  PROLAPSE None    OBJECTIVE:   DIAGNOSTIC FINDINGS:   COGNITION:  Overall cognitive status: Within functional limits for tasks assessed     SENSATION:  Light touch: Appears intact   Proprioception: Appears intact  MUSCLE LENGTH: Bil hamstring and adductor limited by 25%                POSTURE: rounded shoulders and forward head    LUMBARAROM/PROM  A/PROM A/PROM  eval  Flexion Limited by 25%  Extension WFL  Right lateral flexion Limited by 25%  Left lateral flexion Limited by 25%  Right rotation WFL  Left rotation WFL   (Blank rows = not tested)  LOWER EXTREMITY ROM:  Bil WFL LOWER EXTREMITY MMT:   Bil hips 4/5 grossly, knees and ankles 5/5   PALPATION:   General  no TTP, mild fascial restrictions noted in lower abdominal quadrants and over bladder                External Perineal Exam no TTP, dryness noted externally                             Internal Pelvic Floor no TTP however increased tension noted throughout  Patient confirms identification and approves PT to assess internal pelvic floor and treatment Yes  PELVIC MMT:   MMT eval 06/03/22  07/06/22  07/20/22  10/02/22  12/30/22   Vaginal 1/5 with max cues and quick release; without cues 0/5; 1s; 1 rep 1/5 initially but with quick release 2/5.   2/5 with minimal cues 2/5 - however with palpation of superficial muscles pt reported pain with palpation at bulbocavernosus bil and had several small trigger points.  3/5 2/5 due to tension   Internal Anal Sphincter        External Anal Sphincter        Puborectalis        Diastasis Recti        (Blank rows = not tested)        TONE: Mildly increased  PROLAPSE: Not seen in hooklying   TODAY'S TREATMENT   02/08/23: Hamstrings stretches 2x30s Figure 4 2x30s Butterfly 3x30s Quad stretch  2x30s  Pt educated on techniques for improving pelvic floor  relaxation and more tolerance to small dilators as she has not used them recently and to not increase pain attempt smaller size.   01/27/23: UBE x 5 min level 1 fwd only 3 way scapular stabilization 2 x 5 with blue loop 4 D ball rolls with yellow plyo ball x 20  Theraband (green) shoulder extension, rows, bilateral ER and bilateral horizontal abduction x 20 each Doorway stretch x 10 holding 10 sec Trigger Point Dry-Needling  Treatment instructions: Expect mild to moderate muscle soreness. S/S of pneumothorax if dry needled over a lung field, and to seek immediate medical attention should they occur. Patient verbalized understanding of these instructions and education. Patient Consent Given: Yes Education handout provided: Yes Muscles treated: cervical multifidi bilaterally, sub occipitals bilaterally, upper traps bilaterally Electrical stimulation performed: No Parameters: N/A Treatment response/outcome: Skilled palpation used to identify taut bands and trigger points.  Once identified, dry needling techniques used to treat these areas.  Twitch response ellicited along with palpable elongation of muscle.  Following treatment, patient reports increased ROM along with mild soreness.     PATIENT EDUCATION:  Education details: [id number], urge drill  Person educated: Patient Education method: Explanation, Demonstration, Tactile cues, Verbal cues, and Handouts Education comprehension: verbalized understanding and returned demonstration   HOME EXERCISE PROGRAM: Access Code: [email or web address] URL: [email or web address] Date: 12/31/2022 Prepared by: Mikey Kirschner  Exercises - Supine Diaphragmatic Breathing  - 1 x daily - 7 x weekly - 1 sets - 10 reps - Supine Pelvic Floor Contraction  - 1 x daily - 7 x weekly - 1 sets - 10 reps - Seated Table Hamstring Stretch  - 1 x daily - 7 x weekly - 1 sets - 2 reps - 30s holds - Supine Hip Adductor Stretch  - 1 x daily - 7 x weekly - 1 sets - 2 reps  - 30s holds - Supine Bilateral Hip Internal Rotation Stretch  - 1 x daily - 7 x weekly - 1 sets - 2 reps - 30s holds - Tail Wag  - 1 x daily - 7 x weekly - 1 sets - 10 reps - 5s holds - Scapular Retraction with Resistance Advanced  - 2 x daily - 7 x weekly - 3 sets - 10 reps - Scapular Retraction with Resistance  - 2 x daily - 7 x weekly - 3 sets - 10 reps - Shoulder External Rotation and Scapular Retraction with Resistance  - 2 x daily - 7 x weekly - 3 sets - 10 reps - Standing Shoulder Horizontal Abduction with Resistance  - 1 x daily - 7 x weekly - 3 sets - 10 reps  ASSESSMENT:  CLINICAL IMPRESSION: Pt presents for treatment, tolerated well but reports she has been having tightness continue and has not been able to dilators recently. Has been doing self stretching with finger and tolerated well. Has not had intercourse but was externally stimulated and had no pain with spouse. Pt would like to focus on mobility at hips and session focused on this. Pt would benefit from additional PT to further address deficits.     OBJECTIVE IMPAIRMENTS decreased coordination, decreased endurance, decreased mobility, decreased strength, increased fascial restrictions, increased muscle spasms, impaired flexibility, improper body mechanics, postural dysfunction, and pain.   ACTIVITY LIMITATIONS continence, locomotion level, and intercourse  PARTICIPATION LIMITATIONS: interpersonal relationship and community activity  PERSONAL FACTORS Past/current experiences, Time since onset of injury/illness/exacerbation, and 1 comorbidity: medical history  are also affecting patient's functional outcome.   REHAB POTENTIAL: Good  CLINICAL DECISION MAKING: Stable/uncomplicated  EVALUATION COMPLEXITY: Low   GOALS: Goals reviewed with patient? Yes  SHORT TERM GOALS: Target date: 05/26/2022  Pt to be I with HEP.  Baseline: Goal status: MET  2.  Pt to be I with use of feminine moisturizers and lubricants as needed  for improved vaginal health and decreased pain with penetration.  Baseline:  Goal status: MET  3.  Pt to demonstrated improved coordination with pelvic floor and breathing mechanics with body weight squats without leakage or pain for improved pelvic stability.  Baseline:  Goal status: MET   LONG TERM GOALS: Target date: 02/09/23  Pt to be I with advanced HEP.  Baseline:  Goal status: MET  2.  Pt to demonstrate at least 5/5 bil hip strength for improved pelvic stability and functional squats without leakage.  Baseline:  Goal status: MET  3. Pt to demonstrate at least 3/5 pelvic floor strength for improved pelvic stability and decreased strain at pelvic floor/ decrease leakage.  Baseline:  Goal status: MET  4.  Pt to report improved time between bladder voids to at least 2.5 hours for improved QOL with decreased urinary frequency.   Baseline:  Goal status: met  5.  Pt to report no more than 2/10 pain with vaginal penetration for improved QOL and decreased pain with medical procedures. Baseline: 4/10 currently, but up to 5/10 initially  Goal status:  Ongoing  6.  Pt will be able to correctly engage core and pelvic floor in order to knack without leakage. Baseline:  Goal status: REVISED      PLAN: PT FREQUENCY: every other week  PT DURATION: 12 weeks   PLANNED INTERVENTIONS: Therapeutic exercises, Therapeutic activity, Neuromuscular re-education, Patient/Family education, Self Care, Joint mobilization, Aquatic Therapy, Dry Needling, Spinal mobilization, Cryotherapy, Moist heat, scar mobilization, Taping, Biofeedback, and Manual therapy  PLAN FOR NEXT SESSION: Scapular stabilization, rhomboid and shoulder ER training for posture, Progress postural and right shoulder strengthening, DN as indicated   Otelia Sergeant, PT, DPT 05/13/244:42 PM

## 2023-02-11 ENCOUNTER — Ambulatory Visit: Payer: BC Managed Care – PPO

## 2023-02-11 DIAGNOSIS — M542 Cervicalgia: Secondary | ICD-10-CM

## 2023-02-11 DIAGNOSIS — M6281 Muscle weakness (generalized): Secondary | ICD-10-CM

## 2023-02-11 DIAGNOSIS — R293 Abnormal posture: Secondary | ICD-10-CM

## 2023-02-11 DIAGNOSIS — M62838 Other muscle spasm: Secondary | ICD-10-CM

## 2023-02-11 DIAGNOSIS — R252 Cramp and spasm: Secondary | ICD-10-CM

## 2023-02-11 NOTE — Therapy (Signed)
OUTPATIENT PHYSICAL THERAPY FEMALE PELVIC TREATMENT PHYSICAL THERAPY DISCHARGE SUMMARY  Visits from Start of Care: 39  Current functional level related to goals / functional outcomes: See below    Remaining deficits: See below   Education / Equipment: See below   Patient agrees to discharge. Patient goals were met. Patient is being discharged due to meeting the stated rehab goals.    Patient Name: Hannah Woodard MRN: [id number] DOB:[birth year], 52 y.o., female Today's Date: 02/11/2023   PT End of Session - 02/11/23 1621     Visit Number 39    Date for PT Re-Evaluation 02/24/23    Authorization Type [employer]    Authorization Time Period 12/15/22 to 02/09/23    PT Start Time 1615    PT Stop Time 1700    PT Time Calculation (min) 45 min    Activity Tolerance Patient tolerated treatment well;No increased pain    Behavior During Therapy [facility] for tasks assessed/performed                     Past Medical History:  Diagnosis Date   Breast cancer (HCC)    Family history of ovarian cancer 09/02/2017   Family history of prostate cancer in father 09/02/2017   Family history of uterine cancer 09/02/2017   Plantar fasciitis    Past Surgical History:  Procedure Laterality Date   PORT-A-CATH REMOVAL Left 2020   right lumpectomy, sentinel node biopsy, and bilateral mammoplasty  10/2017   [facility]   Patient Active Problem List   Diagnosis Date Noted   Overweight (BMI 25.0-29.9) 12/31/2020   Hyperlipidemia 12/30/2020   Neuropathy due to chemotherapeutic drug (HCC) 12/31/2017   Port-A-Cath in place 11/12/2017   Genetic testing 09/15/2017   Malignant neoplasm of upper-inner quadrant of right breast in female, estrogen receptor positive (HCC) 08/27/2017   Vitamin D deficiency 12/09/2015   Medication management 12/09/2015   Dense breast tissue 12/09/2015    PCP: Dois Davenport, MD  REFERRING PROVIDER: Doylene Bode, NP  REFERRING DIAG:  R35.0 (ICD-10-CM) - Urinary  frequency N94.10 (ICD-10-CM) - Dyspareunia in female T55.6X5A (ICD-10-CM) - Adverse effect of antigonadotrophins, antiestrogens, antiandrogens, not elsewhere classified, initial encounter  THERAPY DIAG:  Muscle weakness (generalized)  Cervicalgia  Abnormal posture  Other muscle spasm  Cramp and spasm  Rationale for Evaluation and Treatment Rehabilitation  ONSET DATE: years  SUBJECTIVE:                                                                                                                                                                                           SUBJECTIVE STATEMENT: Pt reports  she has some discomfort between her shoulder blades today.       PAIN:  Are you having pain? Yes  NPRS scale: 2/4 Pain location:  internally vaginally.  With dryness  PRECAUTIONS: None  WEIGHT BEARING RESTRICTIONS No  FALLS:  Has patient fallen in last 6 months? 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  PLOF: Independent  PATIENT GOALS to have less pain, more regular voids  PERTINENT HISTORY:  personal history of triple positive breast cancer at age 74 and family history of ovarian cancer, BRCA negative on genetic testing Vaginal atrophy Malignant neoplasm of upper-inner quadrant of right breast in female, estrogen receptor positive   Sexual abuse: No  BOWEL MOVEMENT Pain with bowel movement: No Type of bowel movement:Type (Bristol Stool Scale) 2-3, Frequency daily, and Strain Yes but this has improved since starting using step stool in bathroom Fully empty rectum: No Leakage: No Pads: No Fiber supplement: Yes:    URINATION Pain with urination: No sometimes, rarely will have a burning Fully empty bladder: No Stream: Strong and Weak Urgency: Yes: consistently Frequency: 2 hours sometimes sooner, usually gets up at night 1x Leakage: Coughing and Sneezing Pads:  No  INTERCOURSE Pain with intercourse: Initial Penetration, During Penetration, After Intercourse, and Pain Interrupts Intercourse Ability to have vaginal penetration:  Yes:   Climax: not painful but pain prevents climax sometimes Marinoff Scale: 2/3  PREGNANCY Vaginal deliveries 0 Tearing No C-section deliveries 0 Currently pregnant No  PROLAPSE None    OBJECTIVE:   DIAGNOSTIC FINDINGS:   COGNITION:  Overall cognitive status: Within functional limits for tasks assessed     SENSATION:  Light touch: Appears intact   Proprioception: Appears intact  MUSCLE LENGTH: Bil hamstring and adductor limited by 25% DC  02/11/23: Limited approx 25%                POSTURE: rounded shoulders and forward head    LUMBARAROM/PROM  A/PROM A/PROM  eval A/PROM  02/11/23  Flexion Limited by 25% WFL  [facility] WFL  Right lateral flexion Limited by 25% WFL  Left lateral flexion Limited by 25% WFL  Right rotation [facility] WFL  Left rotation [facility] WFL   (Blank rows = not tested)  LOWER EXTREMITY ROM:  Bil WFL LOWER EXTREMITY MMT:   Bil hips 4/5 grossly, knees and ankles 5/5   DC: bilateral hips generally 4+/5   PALPATION:   General  no TTP, mild fascial restrictions noted in lower abdominal quadrants and over bladder                External Perineal Exam no TTP, dryness noted externally                             Internal Pelvic Floor no TTP however increased tension noted throughout  Patient confirms identification and approves PT to assess internal pelvic floor and treatment Yes  PELVIC MMT:   MMT eval 06/03/22  07/06/22  07/20/22  10/02/22  12/30/22   Vaginal 1/5 with max cues and quick release; without cues 0/5; 1s; 1 rep 1/5 initially but with quick release 2/5.   2/5 with minimal cues 2/5 - however with palpation of superficial muscles pt reported pain with palpation at bulbocavernosus bil and had several small trigger points.  3/5 2/5 due to tension   Internal Anal  Sphincter  External Anal Sphincter        Puborectalis        Diastasis Recti        (Blank rows = not tested)        TONE: Mildly increased  PROLAPSE: Not seen in hooklying   TODAY'S TREATMENT   01/27/23: UBE x 5 min level 1 fwd only 3 way scapular stabilization x 10 with blue loop 4 D ball rolls with yellow plyo ball x 20  Theraband (green) shoulder extension, rows, bilateral ER and bilateral horizontal abduction x 20 each Doorway stretch x 10 holding 10 sec Trigger Point Dry-Needling  Treatment instructions: Expect mild to moderate muscle soreness. S/S of pneumothorax if dry needled over a lung field, and to seek immediate medical attention should they occur. Patient verbalized understanding of these instructions and education. Patient Consent Given: Yes Education handout provided: Yes Muscles treated: cervical multifidi bilaterally, sub occipitals bilaterally, upper traps bilaterally, rhomboids bilaterally Electrical stimulation performed: No Parameters: N/A Treatment response/outcome: Skilled palpation used to identify taut bands and trigger points.  Once identified, dry needling techniques used to treat these areas.  Twitch response ellicited along with palpable elongation of muscle.  Following treatment, patient reports significant relief in muscle tension and pain.    02/08/23: Hamstrings stretches 2x30s Figure 4 2x30s Butterfly 3x30s Quad stretch 2x30s  Pt educated on techniques for improving pelvic floor relaxation and more tolerance to small dilators as she has not used them recently and to not increase pain attempt smaller size.   01/27/23: UBE x 5 min level 1 fwd only 3 way scapular stabilization 2 x 5 with blue loop 4 D ball rolls with yellow plyo ball x 20  Theraband (green) shoulder extension, rows, bilateral ER and bilateral horizontal abduction x 20 each Doorway stretch x 10 holding 10 sec Trigger Point Dry-Needling  Treatment instructions: Expect mild  to moderate muscle soreness. S/S of pneumothorax if dry needled over a lung field, and to seek immediate medical attention should they occur. Patient verbalized understanding of these instructions and education. Patient Consent Given: Yes Education handout provided: Yes Muscles treated: cervical multifidi bilaterally, sub occipitals bilaterally, upper traps bilaterally Electrical stimulation performed: No Parameters: N/A Treatment response/outcome: Skilled palpation used to identify taut bands and trigger points.  Once identified, dry needling techniques used to treat these areas.  Twitch response ellicited along with palpable elongation of muscle.  Following treatment, patient reports increased ROM along with mild soreness.     PATIENT EDUCATION:  Education details: [id number], urge drill  Person educated: Patient Education method: Explanation, Demonstration, Tactile cues, Verbal cues, and Handouts Education comprehension: verbalized understanding and returned demonstration   HOME EXERCISE PROGRAM: Access Code: [email or web address] URL: [email or web address] Date: 12/31/2022 Prepared by: Mikey Kirschner  Exercises - Supine Diaphragmatic Breathing  - 1 x daily - 7 x weekly - 1 sets - 10 reps - Supine Pelvic Floor Contraction  - 1 x daily - 7 x weekly - 1 sets - 10 reps - Seated Table Hamstring Stretch  - 1 x daily - 7 x weekly - 1 sets - 2 reps - 30s holds - Supine Hip Adductor Stretch  - 1 x daily - 7 x weekly - 1 sets - 2 reps - 30s holds - Supine Bilateral Hip Internal Rotation Stretch  - 1 x daily - 7 x weekly - 1 sets - 2 reps - 30s holds - Tail Wag  - 1 x daily - 7 x weekly - 1 sets -  10 reps - 5s holds - Scapular Retraction with Resistance Advanced  - 2 x daily - 7 x weekly - 3 sets - 10 reps - Scapular Retraction with Resistance  - 2 x daily - 7 x weekly - 3 sets - 10 reps - Shoulder External Rotation and Scapular Retraction with Resistance  - 2 x daily - 7 x weekly - 3 sets -  10 reps - Standing Shoulder Horizontal Abduction with Resistance  - 1 x daily - 7 x weekly - 3 sets - 10 reps  ASSESSMENT:  CLINICAL IMPRESSION: Boneta Lucks has met all basic goals for thoracic, cervical and lumbar spine.  She has met plateau at this point and understands need to continue with her HEP in order to avoid exacerbation of more pronounced symptoms.  She continues to work diligently on her posture post breast surgeries.  She is independent and fairly compliant with her HEP.  She should continue to do well.    OBJECTIVE IMPAIRMENTS decreased coordination, decreased endurance, decreased mobility, decreased strength, increased fascial restrictions, increased muscle spasms, impaired flexibility, improper body mechanics, postural dysfunction, and pain.   ACTIVITY LIMITATIONS continence, locomotion level, and intercourse  PARTICIPATION LIMITATIONS: interpersonal relationship and community activity  PERSONAL FACTORS Past/current experiences, Time since onset of injury/illness/exacerbation, and 1 comorbidity: medical history  are also affecting patient's functional outcome.   REHAB POTENTIAL: Good  CLINICAL DECISION MAKING: Stable/uncomplicated  EVALUATION COMPLEXITY: Low   GOALS: Goals reviewed with patient? Yes  SHORT TERM GOALS: Target date: 05/26/2022  Pt to be I with HEP.  Baseline: Goal status: MET  2.  Pt to be I with use of feminine moisturizers and lubricants as needed for improved vaginal health and decreased pain with penetration.  Baseline:  Goal status: MET  3.  Pt to demonstrated improved coordination with pelvic floor and breathing mechanics with body weight squats without leakage or pain for improved pelvic stability.  Baseline:  Goal status: MET   LONG TERM GOALS: Target date: 02/09/23  Pt to be I with advanced HEP.  Baseline:  Goal status: MET  2.  Pt to demonstrate at least 5/5 bil hip strength for improved pelvic stability and functional squats without  leakage.  Baseline:  Goal status: MET  3. Pt to demonstrate at least 3/5 pelvic floor strength for improved pelvic stability and decreased strain at pelvic floor/ decrease leakage.  Baseline:  Goal status: MET  4.  Pt to report improved time between bladder voids to at least 2.5 hours for improved QOL with decreased urinary frequency.   Baseline:  Goal status: met  5.  Pt to report no more than 2/10 pain with vaginal penetration for improved QOL and decreased pain with medical procedures. Baseline: 4/10 currently, but up to 5/10 initially  Goal status:  Ongoing  6.  Pt will be able to correctly engage core and pelvic floor in order to knack without leakage. Baseline:  Goal status: MET      PLAN: PT FREQUENCY: every other week  PT DURATION: 12 weeks   PLANNED INTERVENTIONS: Therapeutic exercises, Therapeutic activity, Neuromuscular re-education, Patient/Family education, Self Care, Joint mobilization, Aquatic Therapy, Dry Needling, Spinal mobilization, Cryotherapy, Moist heat, scar mobilization, Taping, Biofeedback, and Manual therapy  PLAN FOR NEXT SESSION: We will DC at this time for patient to continue her HEP independently.    Victorino Dike B. Nyellie Yetter, PT 02/11/23 5:15 PM [facility] [address], [address] [place], [place] [address] Phone # [phone] Fax [phone]

## 2023-03-02 ENCOUNTER — Encounter: Payer: Self-pay | Admitting: Physical Medicine and Rehabilitation

## 2023-03-02 ENCOUNTER — Encounter
Payer: BC Managed Care – PPO | Attending: Physical Medicine and Rehabilitation | Admitting: Physical Medicine and Rehabilitation

## 2023-03-02 VITALS — BP 127/84 | HR 77 | Ht 65.5 in | Wt 162.0 lb

## 2023-03-02 DIAGNOSIS — R293 Abnormal posture: Secondary | ICD-10-CM | POA: Insufficient documentation

## 2023-03-02 DIAGNOSIS — M79674 Pain in right toe(s): Secondary | ICD-10-CM | POA: Insufficient documentation

## 2023-03-02 DIAGNOSIS — D219 Benign neoplasm of connective and other soft tissue, unspecified: Secondary | ICD-10-CM | POA: Diagnosis present

## 2023-03-02 DIAGNOSIS — I10 Essential (primary) hypertension: Secondary | ICD-10-CM | POA: Insufficient documentation

## 2023-03-02 DIAGNOSIS — M21619 Bunion of unspecified foot: Secondary | ICD-10-CM | POA: Diagnosis present

## 2023-03-02 DIAGNOSIS — M25561 Pain in right knee: Secondary | ICD-10-CM | POA: Insufficient documentation

## 2023-03-02 DIAGNOSIS — M79675 Pain in left toe(s): Secondary | ICD-10-CM | POA: Diagnosis present

## 2023-03-02 DIAGNOSIS — R232 Flushing: Secondary | ICD-10-CM | POA: Diagnosis present

## 2023-03-02 DIAGNOSIS — G6289 Other specified polyneuropathies: Secondary | ICD-10-CM | POA: Insufficient documentation

## 2023-03-02 DIAGNOSIS — M25562 Pain in left knee: Secondary | ICD-10-CM | POA: Insufficient documentation

## 2023-03-02 DIAGNOSIS — G4701 Insomnia due to medical condition: Secondary | ICD-10-CM | POA: Insufficient documentation

## 2023-03-02 DIAGNOSIS — M25551 Pain in right hip: Secondary | ICD-10-CM | POA: Insufficient documentation

## 2023-03-02 DIAGNOSIS — G8929 Other chronic pain: Secondary | ICD-10-CM | POA: Insufficient documentation

## 2023-03-02 DIAGNOSIS — M25552 Pain in left hip: Secondary | ICD-10-CM | POA: Insufficient documentation

## 2023-03-02 MED ORDER — TOPIRAMATE 25 MG PO TABS
25.0000 mg | ORAL_TABLET | Freq: Every evening | ORAL | 3 refills | Status: DC
Start: 2023-03-02 — End: 2023-07-13

## 2023-03-02 NOTE — Progress Notes (Signed)
Subjective:    Patient ID: Hannah Woodard, female    DOB: 1971-05-06, 52 y.o.   MRN: [id number]  HPI Mrs.Zavadil is a 52 year old woman who presents for f/u of athralgias secondary to chemotherapy.   1) Chemotherapy induced athralgias -had a stumble since last visit -she uses CBD oil with menthol and capsaicin and ice socks but these are sometimes too cold for her. Has not needed this as often -got 12 weeks relief from Qutenza after the first two applications, but she was not sure if it was efficacious last time.  -she continues to take gabapentin -her dog ate one of her orthotics and this worsened her pain recently -she continues to take liposomal vitamin C and 10,000U vitamin D per day.  -most pain was in the ball of the feet and big toes.  -she felt most benefit from the first Qutenza application, she felt less benefit from the last one. She has some burning the day of application but no uncomfortable burning afterward. After the more recent applications she has had a few days of burning.  -She thinks her pain may have been a little worse because she has been active.  -she asks whether she can go for a walk today, she got a new puppy -she used oral CBD with chem -Initially pain about 8 at worst in the mornings, now down to 5/10.  -she feels traditional medicine is still not open to herbal medicine -the neuropathy started with breast cancer chemotherapy.  -pain right now is 4/10 -pain is intermittent.  -has never tried Pre-Dx -she was on Taxol and Herceptin.  -she hates that she is on any drugs -she thinks she eats healthy and she takes vitamins -pain is mostly present on dorsum of both feet, as well as balls and heels. -charlie horses improved -she gets liposomal vitamin C -she benefited from the exercises given to her by PT -irritated right hip bursa using the foam roller -worse some tight jeans to church and developed a rash.  -could not abduct hip for about a week.  -she  is nervous about the foam roller -was bending leg back and forth for several weeks and then started feeling pain on the medial aspect of big toe  2) High iron level -she was told not to take iron supplement since her level was too high.   3) Hot flashes -continues to take gabapentin for these -not as bad -helps to stay away from caffeine, spicy foods, chocolate  4) Rash -she easily gets rashes   5) Poison Ivy: -she recently got while gardening.   6) Peripheral neuropathy: -she cannot walk on the hot sands  7) Bunion: -she would like referral to podiatry  Pain Inventory Average Pain 3 Pain Right Now 4 My pain is intermittent  LOCATION OF PAIN  feet,toes,leg,neck,thigh,fingers,back,hip,neck  BOWEL Number of stools per week: 7 Oral laxative use fiber powder Type of laxative  Enema or suppository use No  History of colostomy No  Incontinent No   BLADDER Normal  Able to self cath No  Bladder incontinence No  Frequent urination No  Leakage with coughing No  Difficulty starting stream No  Incomplete bladder emptying Yes    Mobility walk without assistance ability to climb steps?  yes do you drive?  yes  Function employed # of hrs/week 37.5  Neuro/Psych numbness tingling  Prior Studies N/a  Physicians involved in your care N/a   Family History  Problem Relation Age of Onset  Ovarian cancer Mother 59   Prostate cancer Father 79       'high gleason' unsure number   Ulcerative colitis Sister    Other Sister        abn ovaian growth, partial hysterectomy   Basal cell carcinoma Sister 29   Uterine cancer Maternal Aunt 67   Stroke Maternal Aunt 66   Basal cell carcinoma Brother    Skin cancer Paternal Uncle    Skin cancer Maternal Grandmother    Stroke Paternal Grandfather 6   Hydrocephalus Cousin    Social History   Socioeconomic History   Marital status: Married    Spouse name: Not on file   Number of children: Not on file   Years of  education: Not on file   Highest education level: Not on file  Occupational History   Occupation: Transport planner  Tobacco Use   Smoking status: Never   Smokeless tobacco: Never  Vaping Use   Vaping Use: Never used  Substance and Sexual Activity   Alcohol use: Not Currently    Comment: rarely    Drug use: No   Sexual activity: Yes    Partners: Male    Birth control/protection: Other-see comments    Comment: chemical menopaus  Other Topics Concern   Not on file  Social History Narrative   Not on file   Social Determinants of Health   Financial Resource Strain: Not on file  Food Insecurity: Not on file  Transportation Needs: Not on file  Physical Activity: Not on file  Stress: Not on file  Social Connections: Not on file   Past Surgical History:  Procedure Laterality Date   PORT-A-CATH REMOVAL Left 2020   right lumpectomy, sentinel node biopsy, and bilateral mammoplasty  10/2017   [facility]   Past Medical History:  Diagnosis Date   Breast cancer (HCC)    Family history of ovarian cancer 09/02/2017   Family history of prostate cancer in father 09/02/2017   Family history of uterine cancer 09/02/2017   Plantar fasciitis    LMP 10/05/2017   Opioid Risk Score:   Fall Risk Score:  `1  Depression screen [facility] 2/9     09/07/2022    9:29 AM 12/09/2021    2:05 PM 09/02/2021    3:09 PM 08/26/2021    9:51 AM 12/31/2020    2:42 PM 01/27/2018    9:17 AM  Depression screen PHQ 2/9  Decreased Interest 0 0 0 0 0 0  Down, Depressed, Hopeless 0 0 0 1 0 0  PHQ - 2 Score 0 0 0 1 0 0  Altered sleeping    2    Tired, decreased energy    2    Change in appetite    0    Feeling bad or failure about yourself     1    Trouble concentrating    0    Moving slowly or fidgety/restless    0    Suicidal thoughts    0    PHQ-9 Score    6         Review of Systems  Constitutional:  Positive for chills, fever and unexpected weight change.  HENT: Negative.    Eyes: Negative.    Respiratory: Negative.    Cardiovascular: Negative.  Negative for chest pain.  Endocrine: Negative.   Genitourinary: Negative.   Musculoskeletal: Negative.   Skin: Negative.   Allergic/Immunologic: Negative.   Neurological: Negative.   Hematological: Negative.  Psychiatric/Behavioral: Negative.         Objective:   Physical Exam Gen: no distress, normal appearing, weight 160 lbs, BMI 26.22, BP 139/75 HEENT: oral mucosa pink and moist, NCAT Cardio: Reg rate Chest: normal effort, normal rate of breathing Abd: soft, non-distended Ext: no edema Psych: pleasant, normal affect Skin: intact Neuro: Alert and oriented x3.  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  -Discussed benefits of exercise in reducing pain. -continue CBD oil -commended on trying ashwagandha -will not repeat Qutenza since did not help last time  -discussed topamax  -Discussed current symptoms of pain and history of pain.  -Discussed benefits of exercise in reducing pain. -Discussed following foods that may reduce pain: 1) Ginger (especially studied for arthritis)- reduce leukotriene production to decrease inflammation 2) Blueberries- high in phytonutrients that decrease inflammation 3) Salmon- marine omega-3s reduce joint swelling and pain 4) Pumpkin seeds- reduce inflammation 5) dark chocolate- reduces inflammation 6) turmeric- reduces inflammation 7) tart cherries - reduce pain and stiffness 8) extra virgin olive oil - its compound olecanthal helps to block prostaglandins  9) chili peppers- can be eaten or applied topically via capsaicin 10) mint- helpful for headache, muscle aches, joint pain, and itching 11) garlic- reduces inflammation  Link to further  information on diet for chronic pain: [email or web address]   2) Hot flashes: -continue gabapentin PRN -discussed that she is on such a low dose that she does not need to wean off.   3) Overweight: -Educated that current weight is 160, commended on losing 10 lbs since I met her -encouraged protein -discussed that decreasing weight can help decrease weight on joints, and thus lessen pain.  -Educated regarding health benefits of weight loss- for pain, general health, chronic disease prevention, immune health, mental health.  -encouraged higher protein, lower bread.  -Will monitor weight every visit.  -Consider Ro[place]ois tea daily.  -Discussed the benefits of intermittent fasting. -Discussed foods that can assist in weight loss: 1) leafy greens- high in fiber and nutrients 2) dark chocolate- improves metabolism (if prefer sweetened, best to sweeten with honey instead of sugar).  3) cruciferous vegetables- high in fiber and protein 4) full fat yogurt: high in healthy fat, protein, calcium, and probiotics 5) apples- high in a variety of phytochemicals 6) nuts- high in fiber and protein that increase feelings of fullness 7) grapefruit: rich in nutrients, antioxidants, and fiber (not to be taken with anticoagulation) 8) beans- high in protein and fiber 9) salmon- has high quality protein and healthy fats 10) green tea- rich in polyphenols 11) eggs- rich in choline and vitamin D 12) tuna- high protein, boosts metabolism 13) avocado- decreases visceral abdominal fat 14) chicken (pasture raised): high in protein and iron 15) blueberries- reduce abdominal fat and cholesterol 16) whole grains- decreases calories retained during digestion, speeds metabolism 17) chia seeds- curb appetite 18) chilies- increases fat metabolism  -Discussed supplements that can be used:  1) Metatrim 400mg  BID 30 minutes before breakfast and  dinner  2) Sphaeranthus indicus and Garcinia mangostana (combinations of these and #1 can be found in capsicum and zychrome  3) green coffee bean extract 400mg  twice per day or Irvingia (african mango) 150 to 300mg  twice per day.  4) Athralgias:  -discussed response to therapy -commended on being able to  walk 1 hour and a half!  5) Pain on bilateral big toes: -discussed toe spacing socks -discussed using wide toe box -discussed that she has formation of mild bunion -continue insoles  6) Insomnia: -continue pregnancy pillow -discussed herbal teas, tart cherry juice, pistachios -discussed that she is a left side sleeper -discussed her challenges in finding the right pillow.  -Try to go outside near sunrise -Get exercise during the day.  -Turn off all devices an hour before bedtime.  -Teas that can benefit: chamomile, valerian root, Brahmi (Bacopa) -Can consider over the counter melatonin, magnesium, and/or L-theanine. Melatonin is an anti-oxidant with multiple health benefits. Magnesium is involved in greater than 300 enzymatic reactions in the body and most of [place] are deficient as our soil is often depleted. There are 7 different types of magnesium- Bioptemizer's is a supplement with all 7 types, and each has unique benefits. Magnesium can also help with constipation and anxiety.  -Pistachios naturally increase the production of melatonin -Cozy Earth bamboo bed sheets are free from toxic chemicals.  -Tart cherry juice or a tart cherry supplement can improve sleep and soreness post-workout    7) Protracted posture -discussed exercises for postural correction -continue PT -discussed the role that foot abnormalities can play in contributing to postural abnormalities -discussed that dry needling helped  8) HTN: -continue magnesium supplement -Advised checking BP daily at home and logging results to bring into follow-up appointment with PCP and myself. -Reviewed BP meds today.   -Advised regarding healthy foods that can help lower blood pressure and provided with a list: 1) citrus foods- high in vitamins and minerals 2) salmon and other fatty fish - reduces inflammation and oxylipins 3) swiss chard (leafy green)- high level of nitrates 4) pumpkin seeds- one of the best natural sources of magnesium 5) Beans and lentils- high in fiber, magnesium, and potassium 6) Berries- high in flavonoids 7) Amaranth (whole grain, can be cooked similarly to rice and oats)- high in magnesium and fiber 8) Pistachios- even more effective at reducing BP than other nuts 9) Carrots- high in phenolic compounds that relax blood vessels and reduce inflammation 10) Celery- contain phthalides that relax tissues of arterial walls 11) Tomatoes- can also improve cholesterol and reduce risk of heart disease 12) Broccoli- good source of magnesium, calcium, and potassium 13) Greek yogurt: high in potassium and calcium 14) Herbs and spices: Celery seed, cilantro, saffron, lemongrass, black cumin, ginseng, cinnamon, cardamom, sweet basil, and ginger 15) Chia and flax seeds- also help to lower cholesterol and blood sugar 16) Beets- high levels of nitrates that relax blood vessels  17) spinach and bananas- high in potassium  -Provided lise of supplements that can help with hypertension:  1) magnesium: one high quality brand is Bioptemizers since it contains all 7 types of magnesium, otherwise over the counter magnesium gluconate 400mg  is a good option 2) B vitamins 3) vitamin D 4) potassium 5) CoQ10 6) L-arginine 7) Vitamin C 8) Beetroot -Educated that goal BP is 120/80. -Made goal to incorporate some of the above foods into diet.     7) Bunion: -discussed loosening of her laces.  -referred to podiatry  8) Pelvic pain: Continue pelvic floor pain  9) Fibroids: -continue regular scans  10) History of cancer: -discussed her concerns with Tamoxifen  >40 minutes spent in discussion of  her blood pressure, her bunions, using wide boxed toe shoes, that she cannot walk on hot sands due to her neuropathy, discussed magnesium for her blood pressure, provided  referral to podiatry, that she is still using the gabapentin and that she cannot sleep without it, discussed that if she does not take it hot flashes wake her up, discussed her concerns with tamoxifen, discussed her friends' experiences with carnivore diet, that his numbers were good, discussed that I have not seen interactions with topamax and other medications

## 2023-03-02 NOTE — Patient Instructions (Addendum)
Foods for blood pressure 1) citrus foods- high in vitamins and minerals 2) salmon and other fatty fish - reduces inflammation and oxylipins 3) swiss chard (leafy green)- high level of nitrates 4) pumpkin seeds- one of the best natural sources of magnesium 5) Beans and lentils- high in fiber, magnesium, and potassium 6) Berries- high in flavonoids 7) Amaranth (whole grain, can be cooked similarly to rice and oats)- high in magnesium and fiber 8) Pistachios- even more effective at reducing BP than other nuts 9) Carrots- high in phenolic compounds that relax blood vessels and reduce inflammation 10) Celery- contain phthalides that relax tissues of arterial walls 11) Tomatoes- can also improve cholesterol and reduce risk of heart disease 12) Broccoli- good source of magnesium, calcium, and potassium 13) Greek yogurt: high in potassium and calcium 14) Herbs and spices: Celery seed, cilantro, saffron, lemongrass, black cumin, ginseng, cinnamon, cardamom, sweet basil, and ginger 15) Chia and flax seeds- also help to lower cholesterol and blood sugar 16) Beets- high levels of nitrates that relax blood vessels  17) spinach and bananas- high in potassium  -Provided lise of supplements that can help with hypertension:  1) magnesium: one high quality brand is Bioptemizers since it contains all 7 types of magnesium, otherwise over the counter magnesium gluconate 400mg  is a good option 2) B vitamins 3) vitamin D 4) potassium 5) CoQ10 6) L-arginine 7) Vitamin C 8) Beetroot -Educated that goal BP is 120/80. -Made goal to incorporate some of the above foods into diet.   Insomnia: -Try to go outside near sunrise -Get exercise during the day.  -Turn off all devices an hour before bedtime.  -Teas that can benefit: chamomile, valerian root, Brahmi (Bacopa) -Can consider over the counter melatonin, magnesium, and/or L-theanine. Melatonin is an anti-oxidant with multiple health benefits. Magnesium is  involved in greater than 300 enzymatic reactions in the body and most of [place] are deficient as our soil is often depleted. There are 7 different types of magnesium- Bioptemizer's is a supplement with all 7 types, and each has unique benefits. Magnesium can also help with constipation and anxiety.  -Pistachios naturally increase the production of melatonin -Cozy Earth bamboo bed sheets are free from toxic chemicals.  -Tart cherry juice or a tart cherry supplement can improve sleep and soreness post-workout

## 2023-03-03 ENCOUNTER — Encounter: Payer: Self-pay | Admitting: Hematology and Oncology

## 2023-03-04 ENCOUNTER — Encounter (HOSPITAL_BASED_OUTPATIENT_CLINIC_OR_DEPARTMENT_OTHER): Payer: BC Managed Care – PPO | Admitting: Physical Medicine and Rehabilitation

## 2023-03-04 DIAGNOSIS — M25551 Pain in right hip: Secondary | ICD-10-CM | POA: Diagnosis not present

## 2023-03-04 DIAGNOSIS — G4701 Insomnia due to medical condition: Secondary | ICD-10-CM | POA: Diagnosis not present

## 2023-03-04 DIAGNOSIS — M25561 Pain in right knee: Secondary | ICD-10-CM | POA: Diagnosis not present

## 2023-03-04 DIAGNOSIS — G8929 Other chronic pain: Secondary | ICD-10-CM | POA: Diagnosis not present

## 2023-03-04 DIAGNOSIS — M25552 Pain in left hip: Secondary | ICD-10-CM

## 2023-03-04 DIAGNOSIS — M25562 Pain in left knee: Secondary | ICD-10-CM

## 2023-03-04 MED ORDER — GABAPENTIN 100 MG PO CAPS: 200.0000 mg | ORAL_CAPSULE | Freq: Every day | ORAL | 3 refills | Status: DC

## 2023-03-04 NOTE — Addendum Note (Signed)
Addended by: Horton Chin on: 03/04/2023 10:36 AM   Modules accepted: Level of Service

## 2023-03-04 NOTE — Progress Notes (Signed)
Subjective:    Patient ID: Hannah Woodard, female    DOB: 02-21-1971, 52 y.o.   MRN: [id number]  HPI An audio/video tele-health visit is felt to be the most appropriate encounter for this patient at this time. This is a follow up tele-visit via phone. The patient is at home. MD is at office. Prior to scheduling this appointment, our staff discussed the limitations of evaluation and management by telemedicine and the availability of in-person appointments. The patient expressed understanding and agreed to proceed.   Hannah Woodard is a 52 year old woman who presents for f/u of athralgias secondary to chemotherapy.   1) Chemotherapy induced athralgias -had a stumble since last visit -she uses CBD oil with menthol and capsaicin and ice socks but these are sometimes too cold for her. Has not needed this as often -got 12 weeks relief from Qutenza after the first two applications, but she was not sure if it was efficacious last time.  -she continues to take gabapentin -her dog ate one of her orthotics and this worsened her pain recently -she continues to take liposomal vitamin C and 10,000U vitamin D per day.  -most pain was in the ball of the feet and big toes.  -she felt most benefit from the first Qutenza application, she felt less benefit from the last one. She has some burning the day of application but no uncomfortable burning afterward. After the more recent applications she has had a few days of burning.  -She thinks her pain may have been a little worse because she has been active.  -she asks whether she can go for a walk today, she got a new puppy -she used oral CBD with chem -Initially pain about 8 at worst in the mornings, now down to 5/10.  -she feels traditional medicine is still not open to herbal medicine -the neuropathy started with breast cancer chemotherapy.  -pain right now is 4/10 -pain is intermittent.  -has never tried Pre-Dx -she was on Taxol and Herceptin.  -she  hates that she is on any drugs -she thinks she eats healthy and she takes vitamins -pain is mostly present on dorsum of both feet, as well as balls and heels. -charlie horses improved -she gets liposomal vitamin C -she benefited from the exercises given to her by PT -irritated right hip bursa using the foam roller -worse some tight jeans to church and developed a rash.  -could not abduct hip for about a week.  -she is nervous about the foam roller -was bending leg back and forth for several weeks and then started feeling pain on the medial aspect of big toe  2) High iron level -she was told not to take iron supplement since her level was too high.   3) Hot flashes -continues to take gabapentin for these -not as bad -helps to stay away from caffeine, spicy foods, chocolate -she would like to wean off the gabapentin  4) Rash -she easily gets rashes   5) Poison Ivy: -she recently got while gardening.   6) Peripheral neuropathy: -she cannot walk on the hot sands  7) Bunion: -she would like referral to podiatry  Pain Inventory Average Pain 3 Pain Right Now 4 My pain is intermittent  LOCATION OF PAIN  feet,toes,leg,neck,thigh,fingers,back,hip,neck  BOWEL Number of stools per week: 7 Oral laxative use fiber powder Type of laxative  Enema or suppository use No  History of colostomy No  Incontinent No   BLADDER Normal  Able to self  cath No  Bladder incontinence No  Frequent urination No  Leakage with coughing No  Difficulty starting stream No  Incomplete bladder emptying Yes    Mobility walk without assistance ability to climb steps?  yes do you drive?  yes  Function employed # of hrs/week 37.5  Neuro/Psych numbness tingling  Prior Studies N/a  Physicians involved in your care N/a   Family History  Problem Relation Age of Onset   Ovarian cancer Mother 36   Prostate cancer Father 32       'high gleason' unsure number   Ulcerative colitis Sister     Other Sister        abn ovaian growth, partial hysterectomy   Basal cell carcinoma Sister 19   Uterine cancer Maternal Aunt 81   Stroke Maternal Aunt 66   Basal cell carcinoma Brother    Skin cancer Paternal Uncle    Skin cancer Maternal Grandmother    Stroke Paternal Grandfather 3   Hydrocephalus Cousin    Social History   Socioeconomic History   Marital status: Married    Spouse name: Not on file   Number of children: Not on file   Years of education: Not on file   Highest education level: Not on file  Occupational History   Occupation: Transport planner  Tobacco Use   Smoking status: Never   Smokeless tobacco: Never  Vaping Use   Vaping Use: Never used  Substance and Sexual Activity   Alcohol use: Not Currently    Comment: rarely    Drug use: No   Sexual activity: Yes    Partners: Male    Birth control/protection: Other-see comments    Comment: chemical menopaus  Other Topics Concern   Not on file  Social History Narrative   Not on file   Social Determinants of Health   Financial Resource Strain: Not on file  Food Insecurity: Not on file  Transportation Needs: Not on file  Physical Activity: Not on file  Stress: Not on file  Social Connections: Not on file   Past Surgical History:  Procedure Laterality Date   PORT-A-CATH REMOVAL Left 2020   right lumpectomy, sentinel node biopsy, and bilateral mammoplasty  10/2017   [facility]   Past Medical History:  Diagnosis Date   Breast cancer (HCC)    Family history of ovarian cancer 09/02/2017   Family history of prostate cancer in father 09/02/2017   Family history of uterine cancer 09/02/2017   Plantar fasciitis    LMP 10/05/2017   Opioid Risk Score:   Fall Risk Score:  `1  Depression screen [facility] 2/9     03/02/2023    2:55 PM 09/07/2022    9:29 AM 12/09/2021    2:05 PM 09/02/2021    3:09 PM 08/26/2021    9:51 AM 12/31/2020    2:42 PM 01/27/2018    9:17 AM  Depression screen PHQ 2/9  Decreased Interest  0 0 0 0 0 0 0  Down, Depressed, Hopeless 0 0 0 0 1 0 0  PHQ - 2 Score 0 0 0 0 1 0 0  Altered sleeping     2    Tired, decreased energy     2    Change in appetite     0    Feeling bad or failure about yourself      1    Trouble concentrating     0    Moving slowly or fidgety/restless  0    Suicidal thoughts     0    PHQ-9 Score     6         Review of Systems  Constitutional:  Positive for chills, fever and unexpected weight change.  HENT: Negative.    Eyes: Negative.   Respiratory: Negative.    Cardiovascular: Negative.  Negative for chest pain.  Endocrine: Negative.   Genitourinary: Negative.   Musculoskeletal: Negative.   Skin: Negative.   Allergic/Immunologic: Negative.   Neurological: Negative.   Hematological: Negative.   Psychiatric/Behavioral: Negative.         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  -Discussed benefits of exercise in reducing pain. -continue CBD oil -commended on trying ashwagandha -will not repeat Qutenza since did not help last time  -discussed topamax and its possible side effect of bone loss  -Discussed current symptoms of pain and history of pain.  -Discussed benefits of exercise in reducing pain. -Discussed following foods that may reduce pain: 1) Ginger (especially studied for arthritis)- reduce leukotriene production to decrease inflammation 2) Blueberries- high in phytonutrients that decrease inflammation 3) Salmon- marine omega-3s reduce joint swelling and pain 4) Pumpkin seeds- reduce inflammation 5) dark chocolate- reduces inflammation 6) turmeric- reduces inflammation 7) tart cherries - reduce pain and stiffness 8) extra virgin olive oil - its compound olecanthal helps to block prostaglandins  9)  chili peppers- can be eaten or applied topically via capsaicin 10) mint- helpful for headache, muscle aches, joint pain, and itching 11) garlic- reduces inflammation  Link to further information on diet for chronic pain: [email or web address]   2) Hot flashes: -continue gabapentin PRN -discussed that she is on such a low dose that she does not need to wean off.   3) Overweight: -Educated that current weight is 160, commended on losing 10 lbs since I met her -encouraged protein -discussed that decreasing weight can help decrease weight on joints, and thus lessen pain.  -Educated regarding health benefits of weight loss- for pain, general health, chronic disease prevention, immune health, mental health.  -encouraged higher protein, lower bread.  -Will monitor weight every visit.  -Consider Roobois tea daily.  -Discussed the benefits of intermittent fasting. -Discussed foods that can assist in weight loss: 1) leafy greens- high in fiber and nutrients 2) dark chocolate- improves metabolism (if prefer sweetened, best to sweeten with honey instead of sugar).  3) cruciferous vegetables- high in fiber and protein 4) full fat yogurt: high in healthy fat, protein, calcium, and probiotics 5) apples- high in a variety of phytochemicals 6) nuts- high in fiber and protein that increase feelings of fullness 7) grapefruit: rich in nutrients, antioxidants, and fiber (not to be taken with anticoagulation) 8) beans- high in protein and fiber 9) salmon- has high quality protein and healthy fats 10) green tea- rich in polyphenols 11) eggs- rich in choline and vitamin D 12) tuna- high protein, boosts metabolism 13) avocado- decreases visceral abdominal fat 14) chicken (pasture raised): high in protein and iron 15) blueberries- reduce abdominal fat and cholesterol 16) whole grains- decreases calories retained during digestion, speeds  metabolism 17) chia seeds- curb appetite 18) chilies- increases fat metabolism  -Discussed supplements that can be used:  1) Metatrim 400mg  BID 30 minutes before breakfast and dinner  2) Sphaeranthus indicus and Garcinia mangostana (combinations of these and #1 can be found in capsicum and zychrome  3) green coffee bean extract 400mg  twice per day or Irvingia (african mango) 150 to 300mg  twice per day.  4) Athralgias:  -discussed response to therapy -commended on being able to walk 1 hour and a half!  5) Pain on bilateral big toes: -discussed toe spacing socks -discussed using wide toe box -discussed that she has formation of mild bunion -continue insoles  6) Insomnia: -discussed wean of gabapentin to 200mg  HS -continue pregnancy pillow -discussed herbal teas, tart cherry juice, pistachios -discussed that she is a left side sleeper -discussed her challenges in finding the right pillow.  -Try to go outside near sunrise -Get exercise during the day.  -Turn off all devices an hour before bedtime.  -Teas that can benefit: chamomile, valerian root, Brahmi (Bacopa) -Can consider over the counter melatonin, magnesium, and/or L-theanine. Melatonin is an anti-oxidant with multiple health benefits. Magnesium is involved in greater than 300 enzymatic reactions in the body and most of [place] are deficient as our soil is often depleted. There are 7 different types of magnesium- Bioptemizer's is a supplement with all 7 types, and each has unique benefits. Magnesium can also help with constipation and anxiety.  -Pistachios naturally increase the production of melatonin -Cozy Earth bamboo bed sheets are free from toxic chemicals.  -Tart cherry juice or a tart cherry supplement can improve sleep and soreness post-workout    7) Protracted posture -discussed exercises for postural correction -continue PT -discussed the role that foot abnormalities can play in contributing to postural  abnormalities -discussed that dry needling helped  8) HTN: -continue magnesium supplement -Advised checking BP daily at home and logging results to bring into follow-up appointment with PCP and myself. -Reviewed BP meds today.  -Advised regarding healthy foods that can help lower blood pressure and provided with a list: 1) citrus foods- high in vitamins and minerals 2) salmon and other fatty fish - reduces inflammation and oxylipins 3) swiss chard (leafy green)- high level of nitrates 4) pumpkin seeds- one of the best natural sources of magnesium 5) Beans and lentils- high in fiber, magnesium, and potassium 6) Berries- high in flavonoids 7) Amaranth (whole grain, can be cooked similarly to rice and oats)- high in magnesium and fiber 8) Pistachios- even more effective at reducing BP than other nuts 9) Carrots- high in phenolic compounds that relax blood vessels and reduce inflammation 10) Celery- contain phthalides that relax tissues of arterial walls 11) Tomatoes- can also improve cholesterol and reduce risk of heart disease 12) Broccoli- good source of magnesium, calcium, and potassium 13) Greek yogurt: high in potassium and calcium 14) Herbs and spices: Celery seed, cilantro, saffron, lemongrass, black cumin, ginseng, cinnamon, cardamom, sweet basil, and ginger 15) Chia and flax seeds- also help to lower cholesterol and blood sugar 16) Beets- high levels of nitrates that relax blood vessels  17) spinach and bananas- high in potassium  -Provided lise of supplements that can help with hypertension:  1) magnesium: one high quality brand is Bioptemizers since it contains all 7 types of magnesium, otherwise over the counter magnesium gluconate 400mg  is a good option 2) B vitamins 3) vitamin D 4) potassium 5) CoQ10 6) L-arginine 7) Vitamin C 8) Beetroot -Educated that goal BP is 120/80. -Made goal to incorporate some of the above foods into diet.     7) Bunion: -discussed loosening  of her laces.  -referred to podiatry  8) Pelvic pain: Continue pelvic floor pain  9) Fibroids: -continue regular scans  10) History of cancer: -discussed her concerns with Tamoxifen  6 minutes spent in discussion of her insomnia, weaning gabapentin to 200mg  HS, tart cherry juice

## 2023-03-05 ENCOUNTER — Telehealth: Payer: Self-pay | Admitting: Surgery

## 2023-03-05 ENCOUNTER — Other Ambulatory Visit: Payer: Self-pay | Admitting: Gynecologic Oncology

## 2023-03-05 DIAGNOSIS — C50211 Malignant neoplasm of upper-inner quadrant of right female breast: Secondary | ICD-10-CM

## 2023-03-05 DIAGNOSIS — Z8041 Family history of malignant neoplasm of ovary: Secondary | ICD-10-CM

## 2023-03-05 MED ORDER — DEXAMETHASONE SODIUM PHOSPHATE 10 MG/ML IJ SOLN
INTRAMUSCULAR | Status: AC
  Filled 2023-03-05: qty 1

## 2023-03-05 MED ORDER — FENTANYL CITRATE (PF) 250 MCG/5ML IJ SOLN
INTRAMUSCULAR | Status: AC
  Filled 2023-03-05: qty 5

## 2023-03-05 MED ORDER — EPHEDRINE 5 MG/ML INJ
INTRAVENOUS | Status: AC
  Filled 2023-03-05: qty 5

## 2023-03-05 MED ORDER — PROPOFOL 10 MG/ML IV BOLUS
INTRAVENOUS | Status: AC
  Filled 2023-03-05: qty 20

## 2023-03-05 MED ORDER — PHENYLEPHRINE HCL (PRESSORS) 10 MG/ML IV SOLN
INTRAVENOUS | Status: AC
  Filled 2023-03-05: qty 1

## 2023-03-05 MED ORDER — PHENYLEPHRINE 80 MCG/ML (10ML) SYRINGE FOR IV PUSH (FOR BLOOD PRESSURE SUPPORT)
PREFILLED_SYRINGE | INTRAVENOUS | Status: AC
  Filled 2023-03-05: qty 10

## 2023-03-05 MED ORDER — MIDAZOLAM HCL 2 MG/2ML IJ SOLN
INTRAMUSCULAR | Status: AC
  Filled 2023-03-05: qty 2

## 2023-03-05 MED ORDER — ROCURONIUM BROMIDE 10 MG/ML (PF) SYRINGE
PREFILLED_SYRINGE | INTRAVENOUS | Status: AC
  Filled 2023-03-05: qty 10

## 2023-03-05 MED ORDER — ONDANSETRON HCL 4 MG/2ML IJ SOLN
INTRAMUSCULAR | Status: AC
  Filled 2023-03-05: qty 2

## 2023-03-05 MED ORDER — LIDOCAINE HCL (PF) 2 % IJ SOLN
INTRAMUSCULAR | Status: AC
  Filled 2023-03-05: qty 5

## 2023-03-05 NOTE — Telephone Encounter (Signed)
Patient called requesting ultrasound and CA 125 lab be scheduled prior to her next appt. Orders placed and scheduled. Patient had no other concerns at this time.

## 2023-03-07 ENCOUNTER — Encounter: Payer: Self-pay | Admitting: Podiatry

## 2023-03-08 ENCOUNTER — Encounter: Payer: Self-pay | Admitting: Gynecologic Oncology

## 2023-03-08 ENCOUNTER — Inpatient Hospital Stay: Payer: BC Managed Care – PPO | Attending: Hematology and Oncology

## 2023-03-08 ENCOUNTER — Ambulatory Visit (HOSPITAL_COMMUNITY)
Admission: RE | Admit: 2023-03-08 | Discharge: 2023-03-08 | Disposition: A | Discharge status: Home / Self Care | Payer: BC Managed Care – PPO | Source: Ambulatory Visit | Attending: Gynecologic Oncology | Admitting: Gynecologic Oncology

## 2023-03-08 DIAGNOSIS — D259 Leiomyoma of uterus, unspecified: Secondary | ICD-10-CM | POA: Insufficient documentation

## 2023-03-08 DIAGNOSIS — Z79811 Long term (current) use of aromatase inhibitors: Secondary | ICD-10-CM | POA: Insufficient documentation

## 2023-03-08 DIAGNOSIS — N838 Other noninflammatory disorders of ovary, fallopian tube and broad ligament: Secondary | ICD-10-CM | POA: Insufficient documentation

## 2023-03-08 DIAGNOSIS — Z8041 Family history of malignant neoplasm of ovary: Secondary | ICD-10-CM | POA: Insufficient documentation

## 2023-03-08 DIAGNOSIS — Z8049 Family history of malignant neoplasm of other genital organs: Secondary | ICD-10-CM | POA: Insufficient documentation

## 2023-03-08 DIAGNOSIS — Z1502 Genetic susceptibility to malignant neoplasm of ovary: Secondary | ICD-10-CM | POA: Insufficient documentation

## 2023-03-08 DIAGNOSIS — Z8042 Family history of malignant neoplasm of prostate: Secondary | ICD-10-CM | POA: Insufficient documentation

## 2023-03-08 DIAGNOSIS — G479 Sleep disorder, unspecified: Secondary | ICD-10-CM | POA: Insufficient documentation

## 2023-03-08 DIAGNOSIS — C50211 Malignant neoplasm of upper-inner quadrant of right female breast: Secondary | ICD-10-CM | POA: Diagnosis present

## 2023-03-08 DIAGNOSIS — Z923 Personal history of irradiation: Secondary | ICD-10-CM | POA: Insufficient documentation

## 2023-03-08 DIAGNOSIS — Z170 Estrogen receptor positive status [ER+]: Secondary | ICD-10-CM | POA: Insufficient documentation

## 2023-03-08 DIAGNOSIS — N941 Unspecified dyspareunia: Secondary | ICD-10-CM | POA: Insufficient documentation

## 2023-03-08 DIAGNOSIS — Z9221 Personal history of antineoplastic chemotherapy: Secondary | ICD-10-CM | POA: Insufficient documentation

## 2023-03-08 DIAGNOSIS — Z808 Family history of malignant neoplasm of other organs or systems: Secondary | ICD-10-CM | POA: Insufficient documentation

## 2023-03-08 NOTE — Patient Instructions (Addendum)
We will let you know as soon as your ultrasound and CA 125 results return.  We will continue with the plan for ultrasounds and CA 125 check every 6 months with office visits once a year or adjust if you decide to have your ovaries removed surgically at any point.   Plan to follow up with Gynecologic Oncology at the [facility] in June 2025. Please call [phone] in early 2025 to request an appointment for June 2025 with Warner Mccreedy, NP.   Symptoms to report to your health care team include vaginal bleeding, rectal bleeding, bloating, weight loss without effort, new and persistent pain, new and  persistent fatigue, new leg swelling, new masses (i.e., bumps in your neck or groin), new and persistent cough, new and persistent nausea and vomiting, change in bowel or bladder habits, and any other concerns.

## 2023-03-09 ENCOUNTER — Inpatient Hospital Stay (HOSPITAL_BASED_OUTPATIENT_CLINIC_OR_DEPARTMENT_OTHER): Payer: BC Managed Care – PPO | Admitting: Gynecologic Oncology

## 2023-03-09 ENCOUNTER — Ambulatory Visit (INDEPENDENT_AMBULATORY_CARE_PROVIDER_SITE_OTHER): Payer: BC Managed Care – PPO

## 2023-03-09 ENCOUNTER — Ambulatory Visit: Payer: BC Managed Care – PPO | Admitting: Podiatry

## 2023-03-09 VITALS — BP 128/77 | HR 77 | Temp 97.6°F | Resp 18 | Ht 65.5 in | Wt 168.2 lb

## 2023-03-09 DIAGNOSIS — M25571 Pain in right ankle and joints of right foot: Secondary | ICD-10-CM

## 2023-03-09 DIAGNOSIS — Z79811 Long term (current) use of aromatase inhibitors: Secondary | ICD-10-CM | POA: Diagnosis not present

## 2023-03-09 DIAGNOSIS — Z808 Family history of malignant neoplasm of other organs or systems: Secondary | ICD-10-CM | POA: Diagnosis not present

## 2023-03-09 DIAGNOSIS — M21611 Bunion of right foot: Secondary | ICD-10-CM

## 2023-03-09 DIAGNOSIS — Z8049 Family history of malignant neoplasm of other genital organs: Secondary | ICD-10-CM | POA: Diagnosis not present

## 2023-03-09 DIAGNOSIS — Z8041 Family history of malignant neoplasm of ovary: Secondary | ICD-10-CM | POA: Diagnosis not present

## 2023-03-09 DIAGNOSIS — G479 Sleep disorder, unspecified: Secondary | ICD-10-CM | POA: Diagnosis not present

## 2023-03-09 DIAGNOSIS — Z1502 Genetic susceptibility to malignant neoplasm of ovary: Secondary | ICD-10-CM | POA: Diagnosis not present

## 2023-03-09 DIAGNOSIS — M205X1 Other deformities of toe(s) (acquired), right foot: Secondary | ICD-10-CM

## 2023-03-09 DIAGNOSIS — N941 Unspecified dyspareunia: Secondary | ICD-10-CM | POA: Diagnosis not present

## 2023-03-09 DIAGNOSIS — Z923 Personal history of irradiation: Secondary | ICD-10-CM | POA: Diagnosis not present

## 2023-03-09 DIAGNOSIS — Z170 Estrogen receptor positive status [ER+]: Secondary | ICD-10-CM | POA: Diagnosis not present

## 2023-03-09 DIAGNOSIS — M19071 Primary osteoarthritis, right ankle and foot: Secondary | ICD-10-CM

## 2023-03-09 DIAGNOSIS — Z8042 Family history of malignant neoplasm of prostate: Secondary | ICD-10-CM | POA: Diagnosis not present

## 2023-03-09 DIAGNOSIS — Z9221 Personal history of antineoplastic chemotherapy: Secondary | ICD-10-CM | POA: Diagnosis not present

## 2023-03-09 DIAGNOSIS — C50211 Malignant neoplasm of upper-inner quadrant of right female breast: Secondary | ICD-10-CM

## 2023-03-09 DIAGNOSIS — Z124 Encounter for screening for malignant neoplasm of cervix: Secondary | ICD-10-CM

## 2023-03-09 NOTE — Progress Notes (Signed)
Gynecologic Oncology Follow-up Note  Chief Complaint:  Chief Complaint  Patient presents with   Cervical cancer screening    Consult was initially requested by Dr. Darnelle Catalan for the evaluation of Hannah Woodard 52 y.o. female  Assessment/Plan: Hannah Woodard is a 52 y.o. female with a personal history of triple positive breast cancer at age 76 and family history of ovarian cancer, BRCA negative on genetic testing. No concerning findings on today's exam. We will plan on checking a CA 125 today. We are waiting for the results of her recent ultrasound from yesterday. Patient advised there have been delays in having scans read with up to 7 days for results to return. She will be contacted when the results return.  We will plan on continuing transvaginal ultrasound scans and Ca 125 lab draws at 6 monthly intervals and continue 12 monthly in-person evaluations unless patient decides for surgical intervention at any point.   Reportable signs and symptoms reviewed. She is advised to call for any needs or concerns or new symptoms.   HPI: Hannah Woodard is a 52 year old female P0 who was seen in consultation at the request of Dr Darnelle Catalan for an increased risk of ovarian cancer.   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  This was treated with surgery radiation and chemotherapy.  She also received trastuzumab, and then goserelin injections for ovarian suppression and anastrozole.  She has remained disease-free since her initial diagnosis.  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  She reported a family history of a mother who had ovarian cancer in her early 48s.  The patient has received genetic testing for BRCA and HRD and this was negative.  Her sibling had her ovaries removed for prophylactic reasons with no malignancy identified.    The patient is nulliparous but desired future pregnancy at initial consultation.   She sought consultation with [facility] reproductive endocrinology.  They felt that she is not a good candidate for egg donation due to her advanced maternal age, however with donation of either an embryo or an egg donor and insemination she might be able to conceive through IVF.  She chose to keep her options open regarding future pregnancy and declined oophorectomy at that time.  She continued to have menopausal symptoms while on goserelin injections.  She uses [place] and coconut oil and other lubricants for intercourse and use of a vaginal dilator for her genital atrophy symptoms. After consultation, she elected for close monitoring until closer to age 52 or natural menopause.   She underwent a transvaginal ultrasound for screening in June 2020 which revealed a prominent right ovary.  It was repeated on June 02, 2019 and showed bilaterally normal ovaries and uterus.  Ca 125 on 03/19/2022 at 16.7. Ca 125 on 09/18/2022 at 15.1.   Last pap smear obtained on 04/14/22 with negative results and HPV high risk not detected.  Interval Hx:  She presents to the office today for continued follow-up.  She reports overall doing well.  She recently sprained her right ankle on a hike to [employer].  She has an appointment with an orthopedic sports medicine provider this afternoon in [place].  Her sister is in the hospital currently being worked up for a mini stroke.  She has been tolerating her diet with no nausea or emesis.  Reports good appetite.  No abdominal/pelvic pain or bloating reported.  She does have right lateral  chest discomfort when reaching in certain directions which she feels may be related to scar tissue from breast surgery in the past.  She would like to begin the weaning process from gabapentin.  She researched this medication and is concerned about the potential side effects.  She continues to have intermittent hot flashes and states these are manageable.  She does have difficulty sleeping  at night.  She just completed pelvic floor physical therapy.  She continues to report intermittent painful intercourse and she has been using lubrication.  Bladder functioning without difficulty.  No vaginal discharge or bleeding reported.  She uses vaginal dilators intermittently.  Bowels functioning without difficulty.  She uses medications as needed for constipation.  She is in need of a bone density scan and states her last was 2 years ago.  She plans to discuss this with Dr. Al Pimple at her next visit.  She would like to keep her ovaries as long as possible given the health benefits, especially if they continue to appear normal on ultrasound scans.  Current Meds:  Outpatient Encounter Medications as of 03/09/2023  Medication Sig   anastrozole (ARIMIDEX) 1 MG tablet Take 1 tablet (1 mg total) by mouth daily.   ascorbic acid (VITAMIN C) 1000 MG tablet Take by mouth.   b complex vitamins tablet Take 1 tablet by mouth daily.   Bioflavonoid Products (QUERCETIN COMPLEX IMMUNE PO) Take by mouth. W/ bromelain   Cholecalciferol (VITAMIN D3) 5000 units TABS Take 10,000 Units by mouth daily.   COLLAGEN PO Take by mouth daily. Takes beef collagen   desoximetasone (TOPICORT) 0.25 % cream Apply topically daily as needed.   fish oil-omega-3 fatty acids 1000 MG capsule Take 1 g by mouth daily.   gabapentin (NEURONTIN) 100 MG capsule Take 2 capsules (200 mg total) by mouth at bedtime.   glucosamine-chondroitin 500-400 MG tablet Take 3 tablets by mouth daily.   Green Tea 200 MG CAPS Take 1 capsule by mouth daily in the afternoon.   lactobacillus acidophilus (BACID) TABS tablet Take 2 tablets by mouth 3 (three) times daily.   Magnesium Citrate POWD Take by mouth daily.   Menaquinone-7 (VITAMIN K2 PO) Take 150 mcg by mouth daily.   montelukast (SINGULAIR) 10 MG tablet TAKE 1 TABLET (10 MG) BY MOUTH EVERY EVENING   senna (SENOKOT) 8.6 MG tablet Take 1 tablet by mouth at bedtime as needed for constipation.    triamcinolone ointment (KENALOG) 0.1 % Apply 1 application topically 2 (two) times daily.   TURMERIC PO Take 1 capsule by mouth as directed.    UNABLE TO FIND Take 500 mg by mouth daily. [place] T[place] mushroom   UNABLE TO FIND Take 500 mg by mouth daily. [place] Mushroom   NEOMYCIN-POLYMYXIN-HYDROCORTISONE (CORTISPORIN) 1 % SOLN OTIC solution 4 drops 3 (three) times daily. (Patient not taking: Reported on 03/08/2023)   topiramate (TOPAMAX) 25 MG tablet Take 1 tablet (25 mg total) by mouth at bedtime. (Patient not taking: Reported on 03/08/2023)   [DISCONTINUED] ondansetron (ZOFRAN-ODT) 4 MG disintegrating tablet TAKE 1 TABLET BY MOUTH FOUR TIMES A DAY AS NEEDED FOR NAUSEA (Patient not taking: Reported on 03/08/2023)   [DISCONTINUED] prochlorperazine (COMPAZINE) 10 MG tablet Take 1 tablet (10 mg total) by mouth every 6 (six) hours as needed (Nausea or vomiting).   No facility-administered encounter medications on file as of 03/09/2023.    Allergy:  Allergies  Allergen Reactions   Sulfamethoxazole-Trimethoprim Rash   Adhesive [Tape]    Milk-Related Compounds Diarrhea  Stomach cramping   Other Rash and Other (See Comments)    Frangrance   Poison Ivy Extract [Poison Ivy Extract] Hives, Itching and Rash    Social Hx:   Social History   Socioeconomic History   Marital status: Married    Spouse name: Not on file   Number of children: Not on file   Years of education: Not on file   Highest education level: Not on file  Occupational History   Occupation: Transport planner  Tobacco Use   Smoking status: Never   Smokeless tobacco: Never  Vaping Use   Vaping Use: Never used  Substance and Sexual Activity   Alcohol use: Not Currently    Comment: rarely    Drug use: No   Sexual activity: Yes    Partners: Male    Birth control/protection: Other-see comments    Comment: chemical menopaus  Other Topics Concern   Not on file  Social History Narrative   Not on file   Social  Determinants of Health   Financial Resource Strain: Not on file  Food Insecurity: Not on file  Transportation Needs: Not on file  Physical Activity: Not on file  Stress: Not on file  Social Connections: Not on file  Intimate Partner Violence: Not on file    Past Surgical Hx:  Past Surgical History:  Procedure Laterality Date   PORT-A-CATH REMOVAL Left 2020   right lumpectomy, sentinel node biopsy, and bilateral mammoplasty  10/2017   [facility]    Past Medical Hx:  Past Medical History:  Diagnosis Date   Breast cancer (HCC)    Family history of ovarian cancer 09/02/2017   Family history of prostate cancer in father 09/02/2017   Family history of uterine cancer 09/02/2017   Plantar fasciitis     Past Gynecological History:  Amenorrheic on goserelin.G0  Patient's last menstrual period was 10/05/2017.  Family Hx:  Family History  Problem Relation Age of Onset   Ovarian cancer Mother 69   Prostate cancer Father 26       'high gleason' unsure number   Ulcerative colitis Sister    Other Sister        abn ovaian growth, partial hysterectomy   Basal cell carcinoma Sister 74   Uterine cancer Maternal Aunt 55   Stroke Maternal Aunt 66   Basal cell carcinoma Brother    Skin cancer Paternal Uncle    Skin cancer Maternal Grandmother    Stroke Paternal Grandfather 57   Hydrocephalus Cousin     Review of Systems: See interval.  Symptoms reported today review of systems form include joint pain, anxiety, pain with sexual intercourse, muscle pain/cramps, hot flashes, gait problem related to recent ankle sprain.  Last mammogram: 05/28/2022  Vitals:     03/09/2023   11:03 AM 03/02/2023    2:50 PM 01/20/2023    3:54 PM  Vitals with BMI  Height 5' 5.5" 5' 5.5"   Weight 168 lbs 4 oz 162 lbs   BMI 27.56 26.54   Systolic 128 127 161  Diastolic 77 84 85  Pulse 77 77 85    Exam: General: Well developed, well nourished female in no acute distress. Alert and oriented x 3.  Neck: Supple  without any enlargements.  Lymph node survey: No cervical, supraclavicular, or inguinal adenopathy.  Cardiovascular: Regular rate and rhythm. S1 and S2 normal.  Lungs: Clear to auscultation bilaterally. No wheezes/crackles/rhonchi noted.  Skin: No rashes or lesions present. Back: No CVA tenderness.  Abdomen:  Abdomen soft, non-tender and non-obese. Active bowel sounds in all quadrants. No evidence of a fluid wave or abdominal masses.  Genitourinary:    Vulva/vagina: Normal external female genitalia. No lesions.    Urethra: No lesions or masses.    Cervix: Normal appearing, no lesions.   Uterus:  Small, mobile, no parametrial involvement or nodularity.   Vagina: Atrophic without any lesions. No palpable masses.  Rectal: Deferred.   Extremities: No bilateral cyanosis, edema, or clubbing. R ankle in brace   Doylene Bode, NP  03/12/2023, 11:40 AM

## 2023-03-09 NOTE — Progress Notes (Signed)
Subjective:  Patient ID: Hannah Woodard, female    DOB: 1971-02-08,  MRN: [id number]  Chief Complaint  Patient presents with   Foot Pain    Sharp pain to the right hallux joint. Patient has neuropathy and is taking Gabapentin nightly.    Ankle Pain    Patient has right ankle pain. Patient twisted her ankle this past weekend. Wearing an ankle brace.     52 y.o. female presents with concern for right ankle pain.  She had a sprain recently.  She twisted her ankle while she was hiking.  She is says the pain is decreasing and she is able to bear weight.  Her swelling is going down she has been elevating and wearing a compression style ankle brace which is helping.  She also has some pain to the right great toe joint.  She does have some neuropathy and also reports some numbness and tingling over that area.  Past Medical History:  Diagnosis Date   Breast cancer (HCC)    Family history of ovarian cancer 09/02/2017   Family history of prostate cancer in father 09/02/2017   Family history of uterine cancer 09/02/2017   Plantar fasciitis     Allergies  Allergen Reactions   Sulfamethoxazole-Trimethoprim Rash   Adhesive [Tape]    Milk-Related Compounds Diarrhea    Stomach cramping   Other Rash and Other (See Comments)    Frangrance   Poison Ivy Extract [Poison Ivy Extract] Hives, Itching and Rash    ROS: Negative except as per HPI above  Objective:  General: AAO x3, NAD  Dermatological: With inspection and palpation of the right and left lower extremities there are no open sores, no preulcerative lesions, no rash or signs of infection present. Nails are of normal length thickness and coloration.   Vascular:  Dorsalis Pedis artery and Posterior Tibial artery pedal pulses are 2/4 bilateral.  Capillary fill time < 3 sec to all digits.   Neruologic: Subjective sensation of pain numbness and tingling at the first MPJ on the right foot  Musculoskeletal: Mild to moderate palpable osseous  prominence noted about the first MPJ on the right foot mild bunion deformity is present with increased medial eminence prominence.  Gait: Unassisted, Nonantalgic.   No images are attached to the encounter.  Radiographs:  Date: 03/09/2023 XR the right foot Weightbearing AP/Lateral/Oblique   Findings: hallux valgus deformity also with met adductus deformity Assessment:   1. Bunion, right foot   2. Arthritis of first metatarsophalangeal (MTP) joint of right foot   3. Hallux limitus of right foot   4. Acute right ankle pain      Plan:  Patient was evaluated and treated and all questions answered.  # Bunion of right foot and hallux limitus # Met adductus -Discussed with patient she does have some evidence of bunion related pain as well as heart joint arthritic pain -I recommend we proceed with steroid injection as the patient does not wish to proceed with surgical correction of the bunion deformity at this time -After sterile alcohol prep injected 1 cc half percent Marcaine plain with 1 cc Kenalog 10 into the right ankle joint -Discussed that surgical intervention would include either fusion versus bunionectomy of the right foot based on the x-rays I would likely recommend Lapidus bunionectomy and met adductus correction with second third TMT J arthrodesis  # Right ankle sprain -Recommend continued RICE therapy including ice compression elevation -Continue use the ankle brace that she has if that does  not do if she can try a lace up style ankle brace for additional support -Believe this injury will be self-limiting and likely subside over the next 1 to 3 weeks  Return if symptoms worsen or fail to improve.          Corinna Gab, DPM [facility]

## 2023-03-10 ENCOUNTER — Telehealth: Payer: Self-pay | Admitting: *Deleted

## 2023-03-10 ENCOUNTER — Other Ambulatory Visit (INDEPENDENT_AMBULATORY_CARE_PROVIDER_SITE_OTHER): Payer: BC Managed Care – PPO | Admitting: Podiatry

## 2023-03-10 ENCOUNTER — Telehealth: Payer: Self-pay | Admitting: Podiatry

## 2023-03-10 LAB — CA 125: Cancer Antigen (CA) 125: 16 U/mL (ref 0.0–38.1)

## 2023-03-10 MED ORDER — MELOXICAM 15 MG PO TABS: 15.0000 mg | ORAL_TABLET | Freq: Every day | ORAL | 0 refills | Status: DC

## 2023-03-10 NOTE — Progress Notes (Signed)
Meloxicam rx sent ?

## 2023-03-10 NOTE — Telephone Encounter (Signed)
Spoke with Ms. Shippee after she left a voicemail wanting to change her appt. On December 20th. Since she has to work that day.  Patient was given a new lab appt. For Monday, December 23rd. At 1230 and a new Pelvic Ultrasound appt. For Monday December 23rd at 1:30 pm. Pt is to arrive at [facility] for 1:15 pm with a full bladder.  Ms. Mcelwee agrees to date and time and receptive to instructions. Pt has no further questions or concerns at this time.

## 2023-03-10 NOTE — Telephone Encounter (Signed)
"  I saw Dr. Annamary Rummage yesterday.  He said he was going to put a prescription in for an anti-inflammatory drug.  My pharmacy has not contacted me."

## 2023-03-11 ENCOUNTER — Ambulatory Visit: Payer: BC Managed Care – PPO | Admitting: Podiatry

## 2023-03-12 ENCOUNTER — Encounter: Payer: Self-pay | Admitting: Hematology and Oncology

## 2023-03-15 ENCOUNTER — Telehealth: Payer: Self-pay | Admitting: Hematology and Oncology

## 2023-03-15 NOTE — Telephone Encounter (Signed)
Returning call per voicemail, left a message for patient regarding rescheduled appointment time/date.

## 2023-03-17 ENCOUNTER — Telehealth: Payer: Self-pay | Admitting: *Deleted

## 2023-03-17 NOTE — Telephone Encounter (Signed)
Spoke with Ms. Hannah Woodard in regards to her results of her pelvic ultrasound that Warner Mccreedy, NP released to her. Patient was reassured that it was normal results. Pt has no further concerns or questions at this time.

## 2023-03-17 NOTE — Telephone Encounter (Signed)
-----   Message from Doylene Bode, NP sent at 03/16/2023  2:32 PM EDT ----- Please reach out tomorrow to make sure she doesn't have any questions about the ultrasound results I released to her. Thanks!

## 2023-03-30 ENCOUNTER — Encounter: Payer: Self-pay | Admitting: Physical Medicine and Rehabilitation

## 2023-03-30 ENCOUNTER — Encounter
Payer: BC Managed Care – PPO | Attending: Physical Medicine and Rehabilitation | Admitting: Physical Medicine and Rehabilitation

## 2023-03-30 VITALS — BP 116/84 | HR 70 | Temp 98.8°F | Ht 65.5 in | Wt 167.0 lb

## 2023-03-30 DIAGNOSIS — M7918 Myalgia, other site: Secondary | ICD-10-CM | POA: Diagnosis present

## 2023-03-30 MED ORDER — SODIUM CHLORIDE (PF) 0.9 % IJ SOLN
3.0000 mL | Freq: Once | INTRAMUSCULAR | Status: AC
Start: 2023-03-30 — End: 2023-03-30
  Administered 2023-03-30: 3 mL via INTRAVENOUS

## 2023-03-30 MED ORDER — LIDOCAINE HCL 1 % IJ SOLN
5.0000 mL | Freq: Once | INTRAMUSCULAR | Status: AC
Start: 2023-03-30 — End: 2023-03-30
  Administered 2023-03-30: 5 mL via INTRADERMAL

## 2023-03-30 NOTE — Progress Notes (Signed)

## 2023-04-08 ENCOUNTER — Other Ambulatory Visit: Payer: Self-pay | Admitting: Podiatry

## 2023-04-09 ENCOUNTER — Other Ambulatory Visit (INDEPENDENT_AMBULATORY_CARE_PROVIDER_SITE_OTHER): Payer: BC Managed Care – PPO | Admitting: Podiatry

## 2023-04-09 MED ORDER — MELOXICAM 15 MG PO TABS: 15.0000 mg | ORAL_TABLET | Freq: Every day | ORAL | 0 refills | Status: DC

## 2023-04-14 ENCOUNTER — Inpatient Hospital Stay: Payer: BC Managed Care – PPO | Attending: Hematology and Oncology

## 2023-04-14 VITALS — BP 128/72 | HR 73 | Temp 98.4°F | Resp 18

## 2023-04-14 DIAGNOSIS — Z5111 Encounter for antineoplastic chemotherapy: Secondary | ICD-10-CM | POA: Diagnosis present

## 2023-04-14 DIAGNOSIS — C50211 Malignant neoplasm of upper-inner quadrant of right female breast: Secondary | ICD-10-CM | POA: Diagnosis not present

## 2023-04-14 DIAGNOSIS — Z170 Estrogen receptor positive status [ER+]: Secondary | ICD-10-CM | POA: Insufficient documentation

## 2023-04-14 DIAGNOSIS — Z95828 Presence of other vascular implants and grafts: Secondary | ICD-10-CM

## 2023-04-14 MED ORDER — GOSERELIN ACETATE 10.8 MG ~~LOC~~ IMPL
10.8000 mg | DRUG_IMPLANT | Freq: Once | SUBCUTANEOUS | Status: AC
  Administered 2023-04-14: 10.8 mg via SUBCUTANEOUS
  Filled 2023-04-14: qty 10.8

## 2023-04-30 ENCOUNTER — Telehealth: Payer: Self-pay | Admitting: Physical Medicine and Rehabilitation

## 2023-04-30 NOTE — Telephone Encounter (Signed)
Prescription is missing dx code. Will be sending fax with information needed  Ext. [phone]

## 2023-05-10 NOTE — Telephone Encounter (Signed)
Scanned for re-printed and placed in provider box.

## 2023-06-07 ENCOUNTER — Encounter: Payer: Self-pay | Admitting: Physical Medicine and Rehabilitation

## 2023-06-07 ENCOUNTER — Encounter
Payer: BC Managed Care – PPO | Attending: Physical Medicine and Rehabilitation | Admitting: Physical Medicine and Rehabilitation

## 2023-06-07 VITALS — BP 126/84 | HR 88 | Ht 65.5 in | Wt 167.0 lb

## 2023-06-07 DIAGNOSIS — M25561 Pain in right knee: Secondary | ICD-10-CM | POA: Insufficient documentation

## 2023-06-07 DIAGNOSIS — W19XXXS Unspecified fall, sequela: Secondary | ICD-10-CM | POA: Diagnosis not present

## 2023-06-07 DIAGNOSIS — M7918 Myalgia, other site: Secondary | ICD-10-CM | POA: Insufficient documentation

## 2023-06-07 DIAGNOSIS — L905 Scar conditions and fibrosis of skin: Secondary | ICD-10-CM | POA: Diagnosis present

## 2023-06-07 DIAGNOSIS — Z853 Personal history of malignant neoplasm of breast: Secondary | ICD-10-CM | POA: Insufficient documentation

## 2023-06-07 DIAGNOSIS — M25611 Stiffness of right shoulder, not elsewhere classified: Secondary | ICD-10-CM | POA: Diagnosis not present

## 2023-06-07 DIAGNOSIS — G6289 Other specified polyneuropathies: Secondary | ICD-10-CM | POA: Diagnosis not present

## 2023-06-07 DIAGNOSIS — Z483 Aftercare following surgery for neoplasm: Secondary | ICD-10-CM | POA: Insufficient documentation

## 2023-06-07 MED ORDER — LIDOCAINE HCL 1 % IJ SOLN
3.0000 mL | Freq: Once | INTRAMUSCULAR | Status: AC
Start: 2023-06-07 — End: 2023-06-08
  Administered 2023-06-08: 3 mL

## 2023-06-07 MED ORDER — SODIUM CHLORIDE (PF) 0.9 % IJ SOLN
2.0000 mL | INTRAMUSCULAR | Status: AC | PRN
Start: 2023-06-07 — End: ?

## 2023-06-07 NOTE — Progress Notes (Signed)
Subjective:    Patient ID: Hannah Woodard, female    DOB: October 16, 1970, 52 y.o.   MRN: [id number]  HPI  Hannah Woodard is a 52 year old woman who presents for f/u of athralgias secondary to chemotherapy.   1) Chemotherapy induced athralgias -had a stumble since last visit -she uses CBD oil with menthol and capsaicin and ice socks but these are sometimes too cold for her. Has not needed this as often -got 12 weeks relief from Qutenza after the first two applications, but she was not sure if it was efficacious last time.  -she continues to take gabapentin -her dog ate one of her orthotics and this worsened her pain recently -she continues to take liposomal vitamin C and 10,000U vitamin D per day.  -most pain was in the ball of the feet and big toes.  -she felt most benefit from the first Qutenza application, she felt less benefit from the last one. She has some burning the day of application but no uncomfortable burning afterward. After the more recent applications she has had a few days of burning.  -She thinks her pain may have been a little worse because she has been active.  -she asks whether she can go for a walk today, she got a new puppy -she used oral CBD with chem -Initially pain about 8 at worst in the mornings, now down to 5/10.  -she feels traditional medicine is still not open to herbal medicine -the neuropathy started with breast cancer chemotherapy.  -pain right now is 4/10 -pain is intermittent.  -has never tried Pre-Dx -she was on Taxol and Herceptin.  -she hates that she is on any drugs -she thinks she eats healthy and she takes vitamins -pain is mostly present on dorsum of both feet, as well as balls and heels. -charlie horses improved -she gets liposomal vitamin C -she benefited from the exercises given to her by PT -irritated right hip bursa using the foam roller -worse some tight jeans to church and developed a rash.  -could not abduct hip for about a week.   -she is nervous about the foam roller -was bending leg back and forth for several weeks and then started feeling pain on the medial aspect of big toe  2) High iron level -she was told not to take iron supplement since her level was too high.   3) Hot flashes -continues to take gabapentin for these -not as bad -helps to stay away from caffeine, spicy foods, chocolate -she would like to wean off the gabapentin  4) Rash -she easily gets rashes   5) Poison Ivy: -she recently got while gardening.   6) Peripheral neuropathy: -she cannot walk on the hot sands  7) Bunion: -she would like referral to podiatry  8) Scar tissue: -breasts are sore from scar tissue  9)   Pain Inventory Average Pain 3 Pain Right Now 4 My pain is intermittent  LOCATION OF PAIN  feet,toes,leg,neck,thigh,fingers,back,hip,neck  BOWEL Number of stools per week: 7 Oral laxative use fiber powder Type of laxative  Enema or suppository use No  History of colostomy No  Incontinent No   BLADDER Normal  Able to self cath No  Bladder incontinence No  Frequent urination No  Leakage with coughing No  Difficulty starting stream No  Incomplete bladder emptying Yes    Mobility walk without assistance ability to climb steps?  yes do you drive?  yes  Function employed # of hrs/week 37.5  Neuro/Psych numbness  tingling  Prior Studies N/a  Physicians involved in your care N/a   Family History  Problem Relation Age of Onset   Ovarian cancer Mother 54   Prostate cancer Father 42       'high gleason' unsure number   Ulcerative colitis Sister    Other Sister        abn ovaian growth, partial hysterectomy   Basal cell carcinoma Sister 24   Uterine cancer Maternal Aunt 8   Stroke Maternal Aunt 66   Basal cell carcinoma Brother    Skin cancer Paternal Uncle    Skin cancer Maternal Grandmother    Stroke Paternal Grandfather 27   Hydrocephalus Cousin    Social History   Socioeconomic  History   Marital status: Married    Spouse name: Not on file   Number of children: Not on file   Years of education: Not on file   Highest education level: Not on file  Occupational History   Occupation: Transport planner  Tobacco Use   Smoking status: Never   Smokeless tobacco: Never  Vaping Use   Vaping status: Never Used  Substance and Sexual Activity   Alcohol use: Not Currently    Comment: rarely    Drug use: No   Sexual activity: Yes    Partners: Male    Birth control/protection: Other-see comments    Comment: chemical menopaus  Other Topics Concern   Not on file  Social History Narrative   Not on file   Social Determinants of Health   Financial Resource Strain: Not on file  Food Insecurity: Not on file  Transportation Needs: Not on file  Physical Activity: Not on file  Stress: Not on file  Social Connections: Not on file   Past Surgical History:  Procedure Laterality Date   PORT-A-CATH REMOVAL Left 2020   right lumpectomy, sentinel node biopsy, and bilateral mammoplasty  10/2017   [facility]   Past Medical History:  Diagnosis Date   Breast cancer (HCC)    Family history of ovarian cancer 09/02/2017   Family history of prostate cancer in father 09/02/2017   Family history of uterine cancer 09/02/2017   Plantar fasciitis    BP 126/84   Pulse 88   Ht 5' 5.5" (1.664 m)   Wt 167 lb (75.8 kg)   LMP 10/05/2017   SpO2 96%   BMI 27.37 kg/m   Opioid Risk Score:   Fall Risk Score:  `1  Depression screen PHQ 2/9     06/07/2023    2:42 PM 03/30/2023   10:17 AM 03/02/2023    2:55 PM 09/07/2022    9:29 AM 12/09/2021    2:05 PM 09/02/2021    3:09 PM 08/26/2021    9:51 AM  Depression screen PHQ 2/9  Decreased Interest 0 0 0 0 0 0 0  Down, Depressed, Hopeless 0 0 0 0 0 0 1  PHQ - 2 Score 0 0 0 0 0 0 1  Altered sleeping       2  Tired, decreased energy       2  Change in appetite       0  Feeling bad or failure about yourself        1  Trouble concentrating        0  Moving slowly or fidgety/restless       0  Suicidal thoughts       0  PHQ-9 Score       6  Review of Systems  Constitutional:  Positive for chills, fever and unexpected weight change.  HENT: Negative.    Eyes: Negative.   Respiratory: Negative.    Cardiovascular: Negative.  Negative for chest pain.  Endocrine: Negative.   Genitourinary: Negative.   Musculoskeletal: Negative.   Skin: Negative.   Allergic/Immunologic: Negative.   Neurological: Negative.   Hematological: Negative.   Psychiatric/Behavioral: Negative.         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 IFC, NMES and TENS medically necessary Treatment Rx: Daily @ 30-40 minutes per treatment PRN. Zynex NexWave only, no substitutions. Treatment Goals: 1) To reduce and/or eliminate pain 2) To improve functional capacity and Activities of daily living 3) To reduce or prevent the need for oral medications 4) To improve circulation in the injured region 5) To decrease or prevent muscle spasm and muscle atrophy 6) To provide a self-management tool to the patient The patient has not sufficiently improved with conservative care. Numerous studies indexed by Medline and PubMed.gov have shown Neuromuscular, Interferential, and TENS stimulators to reduce pain, improve function, and reduce medication use in injured patients. Continued use of this evidence based, safe, drug free treatment is both reasonable and medically necessary at this time.    -Discussed  current symptoms of pain and history of pain.  -Discussed benefits of exercise in reducing pain. -continue CBD oil -commended on trying ashwagandha -will not repeat Qutenza since did not help last time  -discussed topamax and its possible side effect of bone loss  -Discussed current symptoms of pain and history of pain.  -Discussed benefits of exercise in reducing pain. -Discussed following foods that may reduce pain: 1) Ginger (especially studied for arthritis)- reduce leukotriene production to decrease inflammation 2) Blueberries- high in phytonutrients that decrease inflammation 3) Salmon- marine omega-3s reduce joint swelling and pain 4) Pumpkin seeds- reduce inflammation 5) dark chocolate- reduces inflammation 6) turmeric- reduces inflammation 7) tart cherries - reduce pain and stiffness 8) extra virgin olive oil - its compound olecanthal helps to block prostaglandins  9) chili peppers- can be eaten or applied topically via capsaicin 10) mint- helpful for headache, muscle aches, joint pain, and itching 11) garlic- reduces inflammation  Link to further information on diet for chronic pain: [email or web address].[email or web address]   2) Hot flashes: -continue gabapentin PRN -discussed that she is on such a low dose that she does not need to wean off.   3) Overweight: -Educated that current weight is 160, commended on losing 10 lbs since I met her -encouraged protein -discussed that decreasing weight can help decrease weight on joints, and thus lessen pain.  -Educated regarding health benefits of weight loss- for pain, general health, chronic disease prevention, immune health, mental health.  -encouraged higher protein, lower bread.  -Will monitor weight every visit.  -Consider Roobois tea daily.  -Discussed the benefits of intermittent fasting. -Discussed foods that can assist in weight loss: 1) leafy greens- high in fiber  and nutrients 2) dark chocolate- improves metabolism (if prefer sweetened, best to sweeten  with honey instead of sugar).  3) cruciferous vegetables- high in fiber and protein 4) full fat yogurt: high in healthy fat, protein, calcium, and probiotics 5) apples- high in a variety of phytochemicals 6) nuts- high in fiber and protein that increase feelings of fullness 7) grapefruit: rich in nutrients, antioxidants, and fiber (not to be taken with anticoagulation) 8) beans- high in protein and fiber 9) salmon- has high quality protein and healthy fats 10) green tea- rich in polyphenols 11) eggs- rich in choline and vitamin D 12) tuna- high protein, boosts metabolism 13) avocado- decreases visceral abdominal fat 14) chicken (pasture raised): high in protein and iron 15) blueberries- reduce abdominal fat and cholesterol 16) whole grains- decreases calories retained during digestion, speeds metabolism 17) chia seeds- curb appetite 18) chilies- increases fat metabolism  -Discussed supplements that can be used:  1) Metatrim 400mg  BID 30 minutes before breakfast and dinner  2) Sphaeranthus indicus and Garcinia mangostana (combinations of these and #1 can be found in capsicum and zychrome  3) green coffee bean extract 400mg  twice per day or Irvingia (african mango) 150 to 300mg  twice per day.  4) Athralgias:  -discussed response to therapy -commended on being able to walk 1 hour and a half!  5) Pain on bilateral big toes: -discussed toe spacing socks -discussed using wide toe box -discussed that she has formation of mild bunion -continue insoles  6) Insomnia: -discussed wean of gabapentin to 200mg  HS -continue pregnancy pillow -discussed herbal teas, tart cherry juice, pistachios -discussed that she is a left side sleeper -discussed her challenges in finding the right pillow.  -Try to go outside near sunrise -Get exercise during the day.  -Turn off all devices an hour before bedtime.   -Teas that can benefit: chamomile, valerian root, Brahmi (Bacopa) -Can consider over the counter melatonin, magnesium, and/or L-theanine. Melatonin is an anti-oxidant with multiple health benefits. Magnesium is involved in greater than 300 enzymatic reactions in the body and most of [place] are deficient as our soil is often depleted. There are 7 different types of magnesium- Bioptemizer's is a supplement with all 7 types, and each has unique benefits. Magnesium can also help with constipation and anxiety.  -Pistachios naturally increase the production of melatonin -Cozy Earth bamboo bed sheets are free from toxic chemicals.  -Tart cherry juice or a tart cherry supplement can improve sleep and soreness post-workout   7) Protracted posture -discussed exercises for postural correction -continue PT -discussed the role that foot abnormalities can play in contributing to postural abnormalities -discussed that dry needling helped  8) HTN: -continue magnesium supplement -Advised checking BP daily at home and logging results to bring into follow-up appointment with PCP and myself. -Reviewed BP meds today.  -Advised regarding healthy foods that can help lower blood pressure and provided with a list: 1) citrus foods- high in vitamins and minerals 2) salmon and other fatty fish - reduces inflammation and oxylipins 3) swiss chard (leafy green)- high level of nitrates 4) pumpkin seeds- one of the best natural sources of magnesium 5) Beans and lentils- high in fiber, magnesium, and potassium 6) Berries- high in flavonoids 7) Amaranth (whole grain, can be cooked similarly to rice and oats)- high in magnesium and fiber 8) Pistachios- even more effective at reducing BP than other nuts 9) Carrots- high in phenolic compounds that relax blood vessels and reduce inflammation 10) Celery- contain phthalides that relax tissues of arterial walls 11) Tomatoes- can also improve cholesterol and reduce risk of heart  disease 12) Broccoli- good source of magnesium, calcium, and potassium 13) Greek yogurt: high in potassium and calcium 14) Herbs and spices: Celery seed, cilantro, saffron, lemongrass, black cumin, ginseng, cinnamon, cardamom, sweet basil, and ginger 15) Chia and flax seeds- also help to lower cholesterol and blood sugar 16) Beets- high levels of nitrates that relax blood vessels  17) spinach and bananas- high in potassium  -Provided lise of supplements that can help with hypertension:  1) magnesium: one high quality brand is Bioptemizers since it contains all 7 types of magnesium, otherwise over the counter magnesium gluconate 400mg  is a good option 2) B vitamins 3) vitamin D 4) potassium 5) CoQ10 6) L-arginine 7) Vitamin C 8) Beetroot -Educated that goal BP is 120/80. -Made goal to incorporate some of the above foods into diet.     7) Bunion: -discussed loosening of her laces.  -referred to podiatry  8) Pelvic pain: Continue pelvic floor pain  9) Fibroids: -continue regular scans  10) History of cancer: -discussed her concerns with Tamoxifen  11) Right knee pain s/p fall: -discussed fall and her subsequent knee pain  12) Scar tissue/lymphedema: -referred to cancer rehab for lymphatic drainage/massage  13) Right thumb pain:  -continue CBD oil -discussed that she has a sharp pain -discussed that typing aggravates her pain  14) Cervical myofascial pain syndrome: -defers trigger point injections today

## 2023-06-08 DIAGNOSIS — L905 Scar conditions and fibrosis of skin: Secondary | ICD-10-CM | POA: Diagnosis not present

## 2023-06-27 NOTE — Therapy (Signed)
OUTPATIENT PHYSICAL THERAPY  UPPER EXTREMITY ONCOLOGY EVALUATION  Patient Name: ILAINA BOULER MRN: [id number] DOB:Nov 26, 1970, 52 y.o., female Today's Date: 06/28/2023  END OF SESSION:  PT End of Session - 06/28/23 1704     Visit Number 1    Number of Visits 13    Date for PT Re-Evaluation 08/09/23    PT Start Time 1603    PT Stop Time 1700    PT Time Calculation (min) 57 min    Activity Tolerance Patient tolerated treatment well    Behavior During Therapy [facility] for tasks assessed/performed             Past Medical History:  Diagnosis Date   Breast cancer (HCC)    Family history of ovarian cancer 09/02/2017   Family history of prostate cancer in father 09/02/2017   Family history of uterine cancer 09/02/2017   Plantar fasciitis    Past Surgical History:  Procedure Laterality Date   PORT-A-CATH REMOVAL Left 2020   right lumpectomy, sentinel node biopsy, and bilateral mammoplasty  10/2017   [facility]   Patient Active Problem List   Diagnosis Date Noted   Overweight (BMI 25.0-29.9) 12/31/2020   Hyperlipidemia 12/30/2020   Neuropathy due to chemotherapeutic drug (HCC) 12/31/2017   Port-A-Cath in place 11/12/2017   Genetic testing 09/15/2017   Malignant neoplasm of upper-inner quadrant of right breast in female, estrogen receptor positive (HCC) 08/27/2017   Vitamin D deficiency 12/09/2015   Medication management 12/09/2015   Dense breast tissue 12/09/2015    PCP: Nadyne Coombes, MD  REFERRING PROVIDER: Sula Soda, MD  REFERRING DIAG:  Diagnosis  L90.5 (ICD-10-CM) - Scar tissue    THERAPY DIAG:  Aftercare following surgery for neoplasm  Disorder of the skin and subcutaneous tissue related to radiation, unspecified  Stiffness of right shoulder, not elsewhere classified  ONSET DATE: 2019  Rationale for Evaluation and Treatment: Rehabilitation  SUBJECTIVE:                                                                                                                                                                                            SUBJECTIVE STATEMENT: tightness on bilateral incisions Rt>Lt which has been worse the past 6 months.  I also feel tight when I reach with my Rt band.    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 1 negative node removed. Taxol stopped due to neuropathy. Radiation completed.   PAIN:  Are you having pain? Yes NPRS scale: 0/10 up to 5/10 bil breast  Pain location: bil breasts Rt >  Lt  Pain orientation: Bilateral  PAIN TYPE: dull and tight Pain description: intermittent  Aggravating factors: big movements with my arm and my shoulder  Relieving factors: not moving as much  PRECAUTIONS: Rt lymphedema risk   RED FLAGS: None   WEIGHT BEARING RESTRICTIONS: No  FALLS:  Has patient fallen in last 6 months? Yes. Number of falls due to CIPN, x 3, bad with uneven ground   LIVING ENVIRONMENT: Lives with: lives with their family and lives with their spouse  OCCUPATION: works with hearing impaired for [employer]   LEISURE: walking dog 0.6 miles per day   HAND DOMINANCE: left   PRIOR LEVEL OF FUNCTION: Independent  PATIENT GOALS: Try and decrease pain   OBJECTIVE:  COGNITION: Overall cognitive status: Within functional limits for tasks assessed   PALPATION: Fat necrosis medial Rt breast, increased scar nodularity medial breasts bil  OBSERVATIONS / OTHER ASSESSMENTS: Lt breast larger than Rt due to radiation on the Rt.    POSTURE: rounded shoulders, forward head  UPPER EXTREMITY AROM/PROM:  A/PROM RIGHT   eval   Shoulder extension 52  Shoulder flexion 165  Shoulder abduction 162  Shoulder internal rotation   Shoulder external rotation 89 - slight pec tightness* / to 80 in supine with shoulder to 90deg of abduction    (Blank rows = not tested)  A/PROM LEFT   eval  Shoulder extension 40  Shoulder  flexion 165  Shoulder abduction 165  Shoulder internal rotation   Shoulder external rotation 94 / to 90deg in supine with shoulder 90deg of abduction    (Blank rows = not tested)  LYMPHEDEMA ASSESSMENTS:   LANDMARK RIGHT  eval  At axilla    15 cm proximal to olecranon process   10 cm proximal to olecranon process 27.6  Olecranon process 25.2  15 cm proximal to ulnar styloid process   10 cm proximal to ulnar styloid process 21.5  Just proximal to ulnar styloid process 16.5  Across hand at thumb web space 19  At base of 2nd digit 6.0  (Blank rows = not tested)  LANDMARK LEFT  eval  At axilla    15 cm proximal to olecranon process   10 cm proximal to olecranon process 28  Olecranon process 26.0  15 cm proximal to ulnar styloid process   10 cm proximal to ulnar styloid process 21.7  Just proximal to ulnar styloid process 16.6  Across hand at thumb web space 18.5  At base of 2nd digit 6.2  (Blank rows = not tested)  QUICK DASH SURVEY: 28%   TODAY'S TREATMENT:                                                                                                                                          DATE: 06/28/23 Eval performed Added to HEP: single arm doorway stretch and single arm chest stretch added to current  med bridge program from all disciplines.      PATIENT EDUCATION:  Education details: POC, new stretches Person educated: Patient Education method: Programmer, multimedia, Facilities manager, and Handouts Education comprehension: verbalized understanding  HOME EXERCISE PROGRAM: [id number]   ASSESSMENT:  CLINICAL IMPRESSION: Patient is a 52 y.o. female who was seen today for physical therapy evaluation and treatment for her continued Rt shoulder stiffness and breast incision tenderness.  She is now 5 years after surgery and is noting that she feels tighter recently and that the incision is a bit more tender.  She is not doing any self massage or exercises but did do them post op.   Her main deficit is noted with passive ER as tightness in the pectoralis.  She also has incisional tenderness but bilaterally.  She does have a hx of fat necrosis which is palpable.      OBJECTIVE IMPAIRMENTS: decreased knowledge of condition, decreased ROM, increased edema, and increased fascial restrictions.   ACTIVITY LIMITATIONS: none  PARTICIPATION LIMITATIONS: none  PERSONAL FACTORS: Time since onset of injury/illness/exacerbation and 1 comorbidity: SLNB, radiation  are also affecting patient's functional outcome.   REHAB POTENTIAL: Good  CLINICAL DECISION MAKING: Stable/uncomplicated  EVALUATION COMPLEXITY: Moderate  GOALS: Goals reviewed with patient? Yes  SHORT TERM GOALS=LTGs: Target date: 08/09/23  Pt will report reaching in the car is improved by at least 50%  Baseline: Goal status: INITIAL  2.  Pt will be ind with self massage for the breast incisions Baseline:  Goal status: INITIAL  3.  Pt will be given information for second to nature for bras as needed Baseline:  Goal status: INITIAL  4.  Pt will improve Rt shoulder AROM to 90deg to show improved mobility.  Baseline:  Goal status: INITIAL   PLAN:  PT FREQUENCY: 1-2x/week  PT DURATION: 6 weeks  PLANNED INTERVENTIONS: Therapeutic exercises, Therapeutic activity, Patient/Family education, Self Care, Joint mobilization, Manual therapy, and Re-evaluation, DN  PLAN FOR NEXT SESSION: Rt shoulder MFR: pec, lat, PROM, stretching as needed, scar massage education, cupping, taping, DN as needed,   Idamae Lusher, PT 06/28/2023, 5:04 PM

## 2023-06-28 ENCOUNTER — Other Ambulatory Visit: Payer: Self-pay

## 2023-06-28 ENCOUNTER — Encounter: Payer: Self-pay | Admitting: Rehabilitation

## 2023-06-28 ENCOUNTER — Other Ambulatory Visit: Payer: Self-pay | Admitting: Physical Medicine and Rehabilitation

## 2023-06-28 ENCOUNTER — Ambulatory Visit: Payer: BC Managed Care – PPO | Attending: Physical Medicine and Rehabilitation | Admitting: Rehabilitation

## 2023-06-28 DIAGNOSIS — L905 Scar conditions and fibrosis of skin: Secondary | ICD-10-CM | POA: Diagnosis not present

## 2023-06-28 DIAGNOSIS — Z923 Personal history of irradiation: Secondary | ICD-10-CM | POA: Diagnosis not present

## 2023-06-28 DIAGNOSIS — M25611 Stiffness of right shoulder, not elsewhere classified: Secondary | ICD-10-CM | POA: Insufficient documentation

## 2023-06-28 DIAGNOSIS — L599 Disorder of the skin and subcutaneous tissue related to radiation, unspecified: Secondary | ICD-10-CM | POA: Diagnosis not present

## 2023-06-28 DIAGNOSIS — Z483 Aftercare following surgery for neoplasm: Secondary | ICD-10-CM | POA: Insufficient documentation

## 2023-06-28 DIAGNOSIS — Z853 Personal history of malignant neoplasm of breast: Secondary | ICD-10-CM | POA: Diagnosis not present

## 2023-06-28 DIAGNOSIS — I890 Lymphedema, not elsewhere classified: Secondary | ICD-10-CM

## 2023-06-30 ENCOUNTER — Encounter: Payer: Self-pay | Admitting: Rehabilitation

## 2023-06-30 ENCOUNTER — Ambulatory Visit: Payer: BC Managed Care – PPO | Attending: Physical Medicine and Rehabilitation | Admitting: Rehabilitation

## 2023-06-30 DIAGNOSIS — L599 Disorder of the skin and subcutaneous tissue related to radiation, unspecified: Secondary | ICD-10-CM | POA: Insufficient documentation

## 2023-06-30 DIAGNOSIS — Z483 Aftercare following surgery for neoplasm: Secondary | ICD-10-CM | POA: Diagnosis present

## 2023-06-30 DIAGNOSIS — M25611 Stiffness of right shoulder, not elsewhere classified: Secondary | ICD-10-CM | POA: Diagnosis present

## 2023-06-30 NOTE — Therapy (Signed)
OUTPATIENT PHYSICAL THERAPY  UPPER EXTREMITY ONCOLOGY Treatment  Patient Name: CHAMIRA GOEGLEIN MRN: [id number] DOB:05-Dec-1970, 52 y.o., female Today's Date: 06/30/2023  END OF SESSION:  PT End of Session - 06/30/23 1540     Visit Number 2    Number of Visits 13    Date for PT Re-Evaluation 08/09/23    PT Start Time 1407    PT Stop Time 1455    PT Time Calculation (min) 48 min    Activity Tolerance Patient tolerated treatment well    Behavior During Therapy [facility] for tasks assessed/performed              Past Medical History:  Diagnosis Date   Breast cancer (HCC)    Family history of ovarian cancer 09/02/2017   Family history of prostate cancer in father 09/02/2017   Family history of uterine cancer 09/02/2017   Plantar fasciitis    Past Surgical History:  Procedure Laterality Date   PORT-A-CATH REMOVAL Left 2020   right lumpectomy, sentinel node biopsy, and bilateral mammoplasty  10/2017   [facility]   Patient Active Problem List   Diagnosis Date Noted   Overweight (BMI 25.0-29.9) 12/31/2020   Hyperlipidemia 12/30/2020   Neuropathy due to chemotherapeutic drug (HCC) 12/31/2017   Port-A-Cath in place 11/12/2017   Genetic testing 09/15/2017   Malignant neoplasm of upper-inner quadrant of right breast in female, estrogen receptor positive (HCC) 08/27/2017   Vitamin D deficiency 12/09/2015   Medication management 12/09/2015   Dense breast tissue 12/09/2015    PCP: Nadyne Coombes, MD  REFERRING PROVIDER: Sula Soda, MD  REFERRING DIAG:  Diagnosis  L90.5 (ICD-10-CM) - Scar tissue    THERAPY DIAG:  Aftercare following surgery for neoplasm  Disorder of the skin and subcutaneous tissue related to radiation, unspecified  Stiffness of right shoulder, not elsewhere classified  ONSET DATE: 2019  Rationale for Evaluation and Treatment: Rehabilitation  SUBJECTIVE:                                                                                                                                                                                            SUBJECTIVE STATEMENT: Nothing new   EVAL: tightness on bilateral incisions Rt>Lt which has been worse the past 6 months.  I also feel tight when I reach with my Rt hand.    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 1 negative node removed. Taxol stopped due to neuropathy. Radiation completed.   PAIN:  Are you having pain? No  PRECAUTIONS: Rt lymphedema risk   RED FLAGS:  None   WEIGHT BEARING RESTRICTIONS: No  FALLS:  Has patient fallen in last 6 months? Yes. Number of falls due to CIPN, x 3, bad with uneven ground   LIVING ENVIRONMENT: Lives with: lives with their family and lives with their spouse  OCCUPATION: works with hearing impaired for [employer]   LEISURE: walking dog 0.6 miles per day   HAND DOMINANCE: left   PRIOR LEVEL OF FUNCTION: Independent  PATIENT GOALS: Try and decrease pain   OBJECTIVE:  COGNITION: Overall cognitive status: Within functional limits for tasks assessed   PALPATION: Fat necrosis medial Rt breast, increased scar nodularity medial breasts bil  OBSERVATIONS / OTHER ASSESSMENTS: Lt breast larger than Rt due to radiation on the Rt.    POSTURE: rounded shoulders, forward head  UPPER EXTREMITY AROM/PROM:  A/PROM RIGHT   eval   Shoulder extension 52  Shoulder flexion 165  Shoulder abduction 162  Shoulder internal rotation   Shoulder external rotation 89 - slight pec tightness* / to 80 in supine with shoulder to 90deg of abduction    (Blank rows = not tested)  A/PROM LEFT   eval  Shoulder extension 40  Shoulder flexion 165  Shoulder abduction 165  Shoulder internal rotation   Shoulder external rotation 94 / to 90deg in supine with shoulder 90deg of abduction    (Blank rows = not tested)  LYMPHEDEMA ASSESSMENTS:   LANDMARK RIGHT  eval  At axilla    15  cm proximal to olecranon process   10 cm proximal to olecranon process 27.6  Olecranon process 25.2  15 cm proximal to ulnar styloid process   10 cm proximal to ulnar styloid process 21.5  Just proximal to ulnar styloid process 16.5  Across hand at thumb web space 19  At base of 2nd digit 6.0  (Blank rows = not tested)  LANDMARK LEFT  eval  At axilla    15 cm proximal to olecranon process   10 cm proximal to olecranon process 28  Olecranon process 26.0  15 cm proximal to ulnar styloid process   10 cm proximal to ulnar styloid process 21.7  Just proximal to ulnar styloid process 16.6  Across hand at thumb web space 18.5  At base of 2nd digit 6.2  (Blank rows = not tested)  QUICK DASH SURVEY: 28%   TODAY'S TREATMENT:                                                                                                                                          DATE:  06/30/23 Pulleys x 60sec flexion and abduction with cueing for first time Wall ball flexion and abduction bil 5x10" (left was easy) Supine alternating flexion - switched to just Rt due to pain - x6 Diaphragmatic breathing after noting that pt does more chest breathing Supine with arm overhead on pillow: STM Rt pectoralis with cocoa butter Scar  work: sustained pressure of 2" up / down/ Rt/ Lt along border of incision on Rt breast Then more kneading massage along the inferior edge Opposing force pull across breast PROM to the Rt shoulder to tolerance    06/28/23 Eval performed Added to HEP: single arm doorway stretch and single arm chest stretch added to current med bridge program from all disciplines.      PATIENT EDUCATION:  Education details: POC, new stretches Person educated: Patient Education method: Explanation, Facilities manager, and Handouts Education comprehension: verbalized understanding  HOME EXERCISE PROGRAM: [id number]   ASSESSMENT:  CLINICAL IMPRESSION: Pt tolerated first session well. Most exercises  were easy for the Lt side, but good for the Rt side.  Focused on MT for the Rt side so pt can compare for benefits after session.  Tightness in the Rt pectoralis and pt had an increase in passive ER in supine at 90deg by 10 deg pre and post MT.    OBJECTIVE IMPAIRMENTS: decreased knowledge of condition, decreased ROM, increased edema, and increased fascial restrictions.   ACTIVITY LIMITATIONS: none  PARTICIPATION LIMITATIONS: none  PERSONAL FACTORS: Time since onset of injury/illness/exacerbation and 1 comorbidity: SLNB, radiation  are also affecting patient's functional outcome.   REHAB POTENTIAL: Good  CLINICAL DECISION MAKING: Stable/uncomplicated  EVALUATION COMPLEXITY: Moderate  GOALS: Goals reviewed with patient? Yes  SHORT TERM GOALS=LTGs: Target date: 08/09/23  Pt will report reaching in the car is improved by at least 50%  Baseline: Goal status: INITIAL  2.  Pt will be ind with self massage for the breast incisions Baseline:  Goal status: INITIAL  3.  Pt will be given information for second to nature for bras as needed Baseline:  Goal status: INITIAL  4.  Pt will improve Rt shoulder AROM to 90deg to show improved mobility.  Baseline:  Goal status: INITIAL   PLAN:  PT FREQUENCY: 1-2x/week  PT DURATION: 6 weeks  PLANNED INTERVENTIONS: Therapeutic exercises, Therapeutic activity, Patient/Family education, Self Care, Joint mobilization, Manual therapy, and Re-evaluation, DN  PLAN FOR NEXT SESSION: Rt shoulder MFR: pec, lat, PROM, stretching as needed, scar massage education, cupping, taping, DN as needed,   Idamae Lusher, PT 06/30/2023, 3:41 PM

## 2023-07-07 ENCOUNTER — Inpatient Hospital Stay: Payer: BC Managed Care – PPO | Admitting: Hematology and Oncology

## 2023-07-07 ENCOUNTER — Inpatient Hospital Stay: Payer: BC Managed Care – PPO

## 2023-07-08 ENCOUNTER — Encounter: Payer: Self-pay | Admitting: Rehabilitation

## 2023-07-08 ENCOUNTER — Ambulatory Visit: Payer: BC Managed Care – PPO | Admitting: Rehabilitation

## 2023-07-08 DIAGNOSIS — M25611 Stiffness of right shoulder, not elsewhere classified: Secondary | ICD-10-CM

## 2023-07-08 DIAGNOSIS — Z483 Aftercare following surgery for neoplasm: Secondary | ICD-10-CM | POA: Diagnosis not present

## 2023-07-08 DIAGNOSIS — L599 Disorder of the skin and subcutaneous tissue related to radiation, unspecified: Secondary | ICD-10-CM

## 2023-07-08 NOTE — Therapy (Signed)
OUTPATIENT PHYSICAL THERAPY  UPPER EXTREMITY ONCOLOGY Treatment  Patient Name: Hannah Woodard MRN: [id number] DOB:21-Aug-1971, 52 y.o., female Today's Date: 07/09/2023  END OF SESSION:  PT End of Session - 07/08/23 1305     Visit Number 3    Number of Visits 13    Date for PT Re-Evaluation 08/09/23    PT Start Time 1307    PT Stop Time 1356    PT Time Calculation (min) 49 min    Activity Tolerance Patient tolerated treatment well    Behavior During Therapy [facility] for tasks assessed/performed              Past Medical History:  Diagnosis Date   Breast cancer (HCC)    Family history of ovarian cancer 09/02/2017   Family history of prostate cancer in father 09/02/2017   Family history of uterine cancer 09/02/2017   Plantar fasciitis    Past Surgical History:  Procedure Laterality Date   PORT-A-CATH REMOVAL Left 2020   right lumpectomy, sentinel node biopsy, and bilateral mammoplasty  10/2017   [facility]   Patient Active Problem List   Diagnosis Date Noted   Overweight (BMI 25.0-29.9) 12/31/2020   Hyperlipidemia 12/30/2020   Neuropathy due to chemotherapeutic drug (HCC) 12/31/2017   Port-A-Cath in place 11/12/2017   Genetic testing 09/15/2017   Malignant neoplasm of upper-inner quadrant of right breast in female, estrogen receptor positive (HCC) 08/27/2017   Vitamin D deficiency 12/09/2015   Medication management 12/09/2015   Dense breast tissue 12/09/2015    PCP: Nadyne Coombes, MD  REFERRING PROVIDER: Sula Soda, MD  REFERRING DIAG:  Diagnosis  L90.5 (ICD-10-CM) - Scar tissue    THERAPY DIAG:  Aftercare following surgery for neoplasm  Disorder of the skin and subcutaneous tissue related to radiation, unspecified  Stiffness of right shoulder, not elsewhere classified  ONSET DATE: 2019  Rationale for Evaluation and Treatment: Rehabilitation  SUBJECTIVE:                                                                                                                                                                                            SUBJECTIVE STATEMENT:  It felt okay after last time.  A bit looser.    EVAL: tightness on bilateral incisions Rt>Lt which has been worse the past 6 months.  I also feel tight when I reach with my Rt hand.    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 1 negative node removed. Taxol stopped due to neuropathy. Radiation completed.   PAIN:  Are you having pain?  No  PRECAUTIONS: Rt lymphedema risk   RED FLAGS: None   WEIGHT BEARING RESTRICTIONS: No  FALLS:  Has patient fallen in last 6 months? Yes. Number of falls due to CIPN, x 3, bad with uneven ground   LIVING ENVIRONMENT: Lives with: lives with their family and lives with their spouse  OCCUPATION: works with hearing impaired for [employer]   LEISURE: walking dog 0.6 miles per day   HAND DOMINANCE: left   PRIOR LEVEL OF FUNCTION: Independent  PATIENT GOALS: Try and decrease pain   OBJECTIVE:  COGNITION: Overall cognitive status: Within functional limits for tasks assessed   PALPATION: Fat necrosis medial Rt breast, increased scar nodularity medial breasts bil  OBSERVATIONS / OTHER ASSESSMENTS: Lt breast larger than Rt due to radiation on the Rt.    POSTURE: rounded shoulders, forward head  UPPER EXTREMITY AROM/PROM:  A/PROM RIGHT   eval   Shoulder extension 52  Shoulder flexion 165  Shoulder abduction 162  Shoulder internal rotation   Shoulder external rotation 89 - slight pec tightness* / to 80 in supine with shoulder to 90deg of abduction    (Blank rows = not tested)  A/PROM LEFT   eval  Shoulder extension 40  Shoulder flexion 165  Shoulder abduction 165  Shoulder internal rotation   Shoulder external rotation 94 / to 90deg in supine with shoulder 90deg of abduction    (Blank rows = not tested)  LYMPHEDEMA ASSESSMENTS:    LANDMARK RIGHT  eval  At axilla    15 cm proximal to olecranon process   10 cm proximal to olecranon process 27.6  Olecranon process 25.2  15 cm proximal to ulnar styloid process   10 cm proximal to ulnar styloid process 21.5  Just proximal to ulnar styloid process 16.5  Across hand at thumb web space 19  At base of 2nd digit 6.0  (Blank rows = not tested)  LANDMARK LEFT  eval  At axilla    15 cm proximal to olecranon process   10 cm proximal to olecranon process 28  Olecranon process 26.0  15 cm proximal to ulnar styloid process   10 cm proximal to ulnar styloid process 21.7  Just proximal to ulnar styloid process 16.6  Across hand at thumb web space 18.5  At base of 2nd digit 6.2  (Blank rows = not tested)  QUICK DASH SURVEY: 28%   TODAY'S TREATMENT:                                                                                                                                          DATE:  07/08/23 Pulleys x flexion and abduction 1/2 foam roller: snow angel x 5, alternating flexion x 3 bil , protraction/retraction x 5,  Open book bil 5" x 5  Supine with arm overhead on pillow: STM Rt pectoralis with cocoa butter Scar work: sustained pressure of  2" up / down/ Rt/ Lt along border of incision on Rt breast Then more kneading massage along the inferior edge Opposing force pull across breast PROM to the Rt shoulder to tolerance   06/30/23 Pulleys x 60sec flexion and abduction with cueing for first time Wall ball flexion and abduction bil 5x10" (left was easy) Supine alternating flexion - switched to just Rt due to pain - x6 Diaphragmatic breathing after noting that pt does more chest breathing Supine with arm overhead on pillow: STM Rt pectoralis with cocoa butter Scar work: sustained pressure of 2" up / down/ Rt/ Lt along border of incision on Rt breast Then more kneading massage along the inferior edge Opposing force pull across breast PROM to the Rt  shoulder to tolerance   06/28/23 Eval performed Added to HEP: single arm doorway stretch and single arm chest stretch added to current med bridge program from all disciplines.      PATIENT EDUCATION:  Education details: POC, new stretches Person educated: Patient Education method: Solicitor, and Handouts Education comprehension: verbalized understanding  HOME EXERCISE PROGRAM: [id number]   ASSESSMENT:  CLINICAL IMPRESSION: Tightness in the Rt pectoralis and incision border at inferior breast continues.  Included some Left pectoralis and incision work briefly. More tenderness vs tightness at lats bilaterally.     OBJECTIVE IMPAIRMENTS: decreased knowledge of condition, decreased ROM, increased edema, and increased fascial restrictions.   ACTIVITY LIMITATIONS: none  PARTICIPATION LIMITATIONS: none  PERSONAL FACTORS: Time since onset of injury/illness/exacerbation and 1 comorbidity: SLNB, radiation  are also affecting patient's functional outcome.   REHAB POTENTIAL: Good  CLINICAL DECISION MAKING: Stable/uncomplicated  EVALUATION COMPLEXITY: Moderate  GOALS: Goals reviewed with patient? Yes  SHORT TERM GOALS=LTGs: Target date: 08/09/23  Pt will report reaching in the car is improved by at least 50%  Baseline: Goal status: INITIAL  2.  Pt will be ind with self massage for the breast incisions Baseline:  Goal status: INITIAL  3.  Pt will be given information for second to nature for bras as needed Baseline:  Goal status: INITIAL  4.  Pt will improve Rt shoulder AROM to 90deg to show improved mobility.  Baseline:  Goal status: INITIAL   PLAN:  PT FREQUENCY: 1-2x/week  PT DURATION: 6 weeks  PLANNED INTERVENTIONS: Therapeutic exercises, Therapeutic activity, Patient/Family education, Self Care, Joint mobilization, Manual therapy, and Re-evaluation, DN  PLAN FOR NEXT SESSION: Rt shoulder MFR: pec, lat, PROM, stretching as needed, scar massage  education, cupping, taping, DN as needed,   Idamae Lusher, PT 07/09/2023, 8:47 AM

## 2023-07-12 ENCOUNTER — Ambulatory Visit: Payer: BC Managed Care – PPO | Admitting: Rehabilitation

## 2023-07-12 ENCOUNTER — Other Ambulatory Visit: Payer: Self-pay

## 2023-07-12 ENCOUNTER — Encounter: Payer: Self-pay | Admitting: Rehabilitation

## 2023-07-12 DIAGNOSIS — M25611 Stiffness of right shoulder, not elsewhere classified: Secondary | ICD-10-CM

## 2023-07-12 DIAGNOSIS — Z483 Aftercare following surgery for neoplasm: Secondary | ICD-10-CM

## 2023-07-12 DIAGNOSIS — C50211 Malignant neoplasm of upper-inner quadrant of right female breast: Secondary | ICD-10-CM

## 2023-07-12 DIAGNOSIS — L599 Disorder of the skin and subcutaneous tissue related to radiation, unspecified: Secondary | ICD-10-CM

## 2023-07-12 NOTE — Therapy (Signed)
OUTPATIENT PHYSICAL THERAPY  UPPER EXTREMITY ONCOLOGY Treatment  Patient Name: Hannah Woodard MRN: [id number] DOB:[birth year], 52 y.o., female Today's Date: 07/12/2023  END OF SESSION:  PT End of Session - 07/12/23 1300     Visit Number 4    Number of Visits 13    Date for PT Re-Evaluation 08/09/23    PT Start Time 1200    PT Stop Time 1255    PT Time Calculation (min) 55 min    Activity Tolerance Patient tolerated treatment well    Behavior During Therapy [facility] for tasks assessed/performed               Past Medical History:  Diagnosis Date   Breast cancer (HCC)    Family history of ovarian cancer 09/02/2017   Family history of prostate cancer in father 09/02/2017   Family history of uterine cancer 09/02/2017   Plantar fasciitis    Past Surgical History:  Procedure Laterality Date   PORT-A-CATH REMOVAL Left 2020   right lumpectomy, sentinel node biopsy, and bilateral mammoplasty  10/2017   [facility]   Patient Active Problem List   Diagnosis Date Noted   Overweight (BMI 25.0-29.9) 12/31/2020   Hyperlipidemia 12/30/2020   Neuropathy due to chemotherapeutic drug (HCC) 12/31/2017   Port-A-Cath in place 11/12/2017   Genetic testing 09/15/2017   Malignant neoplasm of upper-inner quadrant of right breast in female, estrogen receptor positive (HCC) 08/27/2017   Vitamin D deficiency 12/09/2015   Medication management 12/09/2015   Dense breast tissue 12/09/2015    PCP: Nadyne Coombes, MD  REFERRING PROVIDER: Sula Soda, MD  REFERRING DIAG:  Diagnosis  L90.5 (ICD-10-CM) - Scar tissue    THERAPY DIAG:  Aftercare following surgery for neoplasm  Disorder of the skin and subcutaneous tissue related to radiation, unspecified  Stiffness of right shoulder, not elsewhere classified  ONSET DATE: 2019  Rationale for Evaluation and Treatment: Rehabilitation  SUBJECTIVE:                                                                                                                                                                                            SUBJECTIVE STATEMENT:  I felt fine after last time.      EVAL: tightness on bilateral incisions Rt>Lt which has been worse the past 6 months.  I also feel tight when I reach with my Rt hand.    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 1 negative node removed. Taxol stopped due to neuropathy. Radiation completed.   PAIN:  Are you having pain? No  PRECAUTIONS: Rt lymphedema risk   RED FLAGS: None   WEIGHT BEARING RESTRICTIONS: No  FALLS:  Has patient fallen in last 6 months? Yes. Number of falls due to CIPN, x 3, bad with uneven ground   LIVING ENVIRONMENT: Lives with: lives with their family and lives with their spouse  OCCUPATION: works with hearing impaired for [employer]   LEISURE: walking dog 0.6 miles per day   HAND DOMINANCE: left   PRIOR LEVEL OF FUNCTION: Independent  PATIENT GOALS: Try and decrease pain   OBJECTIVE:  COGNITION: Overall cognitive status: Within functional limits for tasks assessed   PALPATION: Fat necrosis medial Rt breast, increased scar nodularity medial breasts bil  OBSERVATIONS / OTHER ASSESSMENTS: Lt breast larger than Rt due to radiation on the Rt.    POSTURE: rounded shoulders, forward head  UPPER EXTREMITY AROM/PROM:  A/PROM RIGHT   eval   Shoulder extension 52  Shoulder flexion 165  Shoulder abduction 162  Shoulder internal rotation   Shoulder external rotation 89 - slight pec tightness* / to 80 in supine with shoulder to 90deg of abduction    (Blank rows = not tested)  A/PROM LEFT   eval  Shoulder extension 40  Shoulder flexion 165  Shoulder abduction 165  Shoulder internal rotation   Shoulder external rotation 94 / to 90deg in supine with shoulder 90deg of abduction    (Blank rows = not tested)  LYMPHEDEMA ASSESSMENTS:   LANDMARK RIGHT   eval  At axilla    15 cm proximal to olecranon process   10 cm proximal to olecranon process 27.6  Olecranon process 25.2  15 cm proximal to ulnar styloid process   10 cm proximal to ulnar styloid process 21.5  Just proximal to ulnar styloid process 16.5  Across hand at thumb web space 19  At base of 2nd digit 6.0  (Blank rows = not tested)  LANDMARK LEFT  eval  At axilla    15 cm proximal to olecranon process   10 cm proximal to olecranon process 28  Olecranon process 26.0  15 cm proximal to ulnar styloid process   10 cm proximal to ulnar styloid process 21.7  Just proximal to ulnar styloid process 16.6  Across hand at thumb web space 18.5  At base of 2nd digit 6.2  (Blank rows = not tested)  QUICK DASH SURVEY: 28%   TODAY'S TREATMENT:                                                                                                                                          DATE:  07/08/23 Pulleys x flexion and abduction 1/2 foam roller: snow angel x 5, alternating flexion x 3 bil , protraction/retraction x 5, abduction stretch bil with Rt arm around 80deg and left around 55 deg Open book bil 5" x 5 with 1/2 FR under knee to keep good  positioning  Supine with arm overhead on pillow: STM Rt pectoralis with cocoa butter Scar work: sustained pressure of 2" up / down/ Rt/ Lt along border of incision on Rt breast Then more kneading massage along the inferior edge Opposing force pull across breast PROM to the Rt shoulder to tolerance   07/08/23 Pulleys x flexion and abduction 1/2 foam roller: snow angel x 5, alternating flexion x 3 bil , protraction/retraction x 5,  Open book bil 5" x 5  Supine with arm overhead on pillow: STM Rt pectoralis with cocoa butter Scar work: sustained pressure of 2" up / down/ Rt/ Lt along border of incision on Rt breast Then more kneading massage along the inferior edge Opposing force pull across breast PROM to the Rt shoulder to  tolerance   06/30/23 Pulleys x 60sec flexion and abduction with cueing for first time Wall ball flexion and abduction bil 5x10" (left was easy) Supine alternating flexion - switched to just Rt due to pain - x6 Diaphragmatic breathing after noting that pt does more chest breathing Supine with arm overhead on pillow: STM Rt pectoralis with cocoa butter Scar work: sustained pressure of 2" up / down/ Rt/ Lt along border of incision on Rt breast Then more kneading massage along the inferior edge Opposing force pull across breast PROM to the Rt shoulder to tolerance   06/28/23 Eval performed Added to HEP: single arm doorway stretch and single arm chest stretch added to current med bridge program from all disciplines.      PATIENT EDUCATION:  Education details: POC, new stretches Person educated: Patient Education method: Solicitor, and Handouts Education comprehension: verbalized understanding  HOME EXERCISE PROGRAM: [id number]   ASSESSMENT:  CLINICAL IMPRESSION: Tightness in the Rt pectoralis and incision border at inferior breast continues. The incision tenderness seems localized to around 3 inches at the mid breast.   Included some Left pectoralis and incision work briefly. More tenderness vs tightness at lats bilaterally.     OBJECTIVE IMPAIRMENTS: decreased knowledge of condition, decreased ROM, increased edema, and increased fascial restrictions.   ACTIVITY LIMITATIONS: none  PARTICIPATION LIMITATIONS: none  PERSONAL FACTORS: Time since onset of injury/illness/exacerbation and 1 comorbidity: SLNB, radiation  are also affecting patient's functional outcome.   REHAB POTENTIAL: Good  CLINICAL DECISION MAKING: Stable/uncomplicated  EVALUATION COMPLEXITY: Moderate  GOALS: Goals reviewed with patient? Yes  SHORT TERM GOALS=LTGs: Target date: 08/09/23  Pt will report reaching in the car is improved by at least 50%  Baseline: Goal status: INITIAL  2.  Pt  will be ind with self massage for the breast incisions Baseline:  Goal status: INITIAL  3.  Pt will be given information for second to nature for bras as needed Baseline:  Goal status: INITIAL  4.  Pt will improve Rt shoulder AROM to 90deg to show improved mobility.  Baseline:  Goal status: INITIAL   PLAN:  PT FREQUENCY: 1-2x/week  PT DURATION: 6 weeks  PLANNED INTERVENTIONS: Therapeutic exercises, Therapeutic activity, Patient/Family education, Self Care, Joint mobilization, Manual therapy, and Re-evaluation, DN  PLAN FOR NEXT SESSION: Rt shoulder MFR: pec, lat, PROM, stretching as needed, scar massage education, cupping, taping, DN as needed,   Idamae Lusher, PT 07/12/2023, 1:01 PM

## 2023-07-13 ENCOUNTER — Inpatient Hospital Stay: Payer: BC Managed Care – PPO

## 2023-07-13 ENCOUNTER — Inpatient Hospital Stay (HOSPITAL_BASED_OUTPATIENT_CLINIC_OR_DEPARTMENT_OTHER): Payer: BC Managed Care – PPO | Admitting: Hematology and Oncology

## 2023-07-13 ENCOUNTER — Inpatient Hospital Stay: Payer: BC Managed Care – PPO | Attending: Hematology and Oncology

## 2023-07-13 VITALS — BP 132/77 | HR 80 | Temp 97.5°F | Resp 18 | Wt 170.4 lb

## 2023-07-13 DIAGNOSIS — C50211 Malignant neoplasm of upper-inner quadrant of right female breast: Secondary | ICD-10-CM

## 2023-07-13 DIAGNOSIS — R296 Repeated falls: Secondary | ICD-10-CM | POA: Diagnosis not present

## 2023-07-13 DIAGNOSIS — Z79811 Long term (current) use of aromatase inhibitors: Secondary | ICD-10-CM | POA: Diagnosis not present

## 2023-07-13 DIAGNOSIS — G629 Polyneuropathy, unspecified: Secondary | ICD-10-CM | POA: Diagnosis not present

## 2023-07-13 DIAGNOSIS — Z170 Estrogen receptor positive status [ER+]: Secondary | ICD-10-CM

## 2023-07-13 DIAGNOSIS — Z5111 Encounter for antineoplastic chemotherapy: Secondary | ICD-10-CM | POA: Insufficient documentation

## 2023-07-13 DIAGNOSIS — Z95828 Presence of other vascular implants and grafts: Secondary | ICD-10-CM

## 2023-07-13 LAB — CMP (CANCER CENTER ONLY)
ALT: 23 U/L (ref 0–44)
AST: 19 U/L (ref 15–41)
Albumin: 4.6 g/dL (ref 3.5–5.0)
Alkaline Phosphatase: 96 U/L (ref 38–126)
Anion gap: 7 (ref 5–15)
BUN: 21 mg/dL — ABNORMAL HIGH (ref 6–20)
CO2: 31 mmol/L (ref 22–32)
Calcium: 10.1 mg/dL (ref 8.9–10.3)
Chloride: 105 mmol/L (ref 98–111)
Creatinine: 0.95 mg/dL (ref 0.44–1.00)
GFR, Estimated: >60 mL/min (ref >60)
Glucose, Bld: 95 mg/dL (ref 70–99)
Potassium: 3.9 mmol/L (ref 3.5–5.1)
Sodium: 143 mmol/L (ref 135–145)
Total Bilirubin: 0.5 mg/dL (ref 0.3–1.2)
Total Protein: 7 g/dL (ref 6.5–8.1)

## 2023-07-13 LAB — CBC WITH DIFFERENTIAL (CANCER CENTER ONLY)
Abs Immature Granulocytes: 0.02 10*3/uL (ref 0.00–0.07)
Basophils Absolute: 0 10*3/uL (ref 0.0–0.1)
Basophils Relative: 1 %
Eosinophils Absolute: 0.1 10*3/uL (ref 0.0–0.5)
Eosinophils Relative: 1 %
HCT: 38.9 % (ref 36.0–46.0)
Hemoglobin: 13.6 g/dL (ref 12.0–15.0)
Immature Granulocytes: 0 %
Lymphocytes Relative: 27 %
Lymphs Abs: 1.4 10*3/uL (ref 0.7–4.0)
MCH: 33.1 pg (ref 26.0–34.0)
MCHC: 35 g/dL (ref 30.0–36.0)
MCV: 94.6 fL (ref 80.0–100.0)
Monocytes Absolute: 0.4 10*3/uL (ref 0.1–1.0)
Monocytes Relative: 8 %
Neutro Abs: 3.4 10*3/uL (ref 1.7–7.7)
Neutrophils Relative %: 63 %
Platelet Count: 225 10*3/uL (ref 150–400)
RBC: 4.11 MIL/uL (ref 3.87–5.11)
RDW: 12.8 % (ref 11.5–15.5)
WBC Count: 5.4 10*3/uL (ref 4.0–10.5)
nRBC: 0 % (ref 0.0–0.2)

## 2023-07-13 MED ORDER — GOSERELIN ACETATE 10.8 MG ~~LOC~~ IMPL
10.8000 mg | DRUG_IMPLANT | Freq: Once | SUBCUTANEOUS | Status: AC
  Administered 2023-07-13: 10.8 mg via SUBCUTANEOUS
  Filled 2023-07-13: qty 10.8

## 2023-07-13 MED ORDER — ANASTROZOLE 1 MG PO TABS: 1.0000 mg | ORAL_TABLET | Freq: Every day | ORAL | 4 refills | Status: DC

## 2023-07-13 NOTE — Progress Notes (Signed)
[facility]  [phone] Fax:([phone]) [phone]     ID: Hannah Woodard DOB: [birth year]  MR#: [id number]  ZHY#:[id number]  Patient Care Team: Dois Davenport, MD as PCP - General (Family Medicine) Griselda Miner, MD as Consulting Physician (General Surgery) Dorothy Puffer, MD as Consulting Physician (Radiation Oncology) Althea Charon, MD (Radiology) Howard-McNatt, Ashok Cordia, MD as Referring Physician (Surgery) Rachel Moulds, MD as Consulting Physician (Hematology and Oncology) OTHER MD:  CHIEF COMPLAINT: Triple positive breast cancer  CURRENT TREATMENT: anastrozole, Zoladex  INTERVAL HISTORY: Hannah Woodard returns today for follow-up of her triple positive breast cancer.   The patient, with a history of cancer and currently on Anastrozole and Zoladex, presents with concerns about neuropathy and associated falls. She reports falling three times in the past six months due to not picking up her feet, attributing this to the neuropathy. She also mentions arthritis in the big toe, which adds to her discomfort.  The patient also brings up skin issues, including dry patches and hyperpigmentation. She has tried various treatments, including acne medicine and doTERRA products, with limited success. She plans to schedule a dermatology appointment to further investigate these issues.  The patient is also concerned about the side effects of Anastrozole, including achiness and vaginal issues. She has been managing these side effects with physical therapy, including pelvic floor therapy and posture therapy.   The patient is proactive in managing her health, engaging in regular exercise and exploring various therapies to manage her symptoms. She expresses a willingness to continue with her current medication regimen despite the side effects, as she understands the importance of these medications in managing her cancer.  HISTORY OF CURRENT ILLNESS: From the original intake  note:  The patient has a history of cysts and nodules in the right breast which have been closely followed, with exams 07/30/2016 and 01/28/2017.  On 07/29/2017 follow-up right breast ultrasonography showed no findings of concern.  Bilateral diagnostic mammography 08/18/2017 with right breast ultrasonography on 08/18/2017 at [facility] found the breast density to be category C.  There was now a new irregular high density mass in the posterior portion of the right breast seen on the mediolateral oblique view only.  Ultrasonography confirmed a 1.4 cm lobulated mass in the upper inner quadrant of the right breast 10 cm from the nipple associated with pectoral muscle invasion.  The right axilla was sonographically benign.  Biopsy of this mass 08/24/2017 showed ([id number]) invasive ductal carcinoma, grade 2, estrogen receptor 95% positive, progesterone receptor 100% positive, both with strong staining intensity, with an MIB-1 of 60%, and HER-2 amplified, with a signals ratio of 2.84 (full report pending).  The patient's subsequent history is as detailed below.   PAST MEDICAL HISTORY: Past Medical History:  Diagnosis Date   Breast cancer (HCC)    Family history of ovarian cancer 09/02/2017   Family history of prostate cancer in father 09/02/2017   Family history of uterine cancer 09/02/2017   Plantar fasciitis     PAST SURGICAL HISTORY: Past Surgical History:  Procedure Laterality Date   PORT-A-CATH REMOVAL Left 2020   right lumpectomy, sentinel node biopsy, and bilateral mammoplasty  10/2017   [facility]    FAMILY HISTORY Family History  Problem Relation Age of Onset   Ovarian cancer Mother 51   Prostate cancer Father 34       'high gleason' unsure number   Ulcerative colitis Sister    Other Sister        abn ovaian  growth, partial hysterectomy   Basal cell carcinoma Sister 20   Uterine cancer Maternal Aunt 55   Stroke Maternal Aunt 66   Basal cell carcinoma Brother    Skin cancer Paternal  Uncle    Skin cancer Maternal Grandmother    Stroke Paternal Grandfather 36   Hydrocephalus Cousin   The patient's father died from alcoholic cirrhosis at age 86.  He had been diagnosed with prostate cancer at age 42.  The patient's mother was diagnosed with ovarian cancer at age 40 and died a year later.  The patient has 1 brother, 1 sister.  The patient's brother has had basal cell and other skin cancers.  A paternal aunt had uterine cancer.  Other relatives have had skin cancers but the patient does not know if these were melanomas or not   GYNECOLOGIC HISTORY:  Patient's last menstrual period was 10/05/2017.  Menarche age 62, the patient has never been pregnant.  She is still having regular periods.  She used oral contraceptives briefly in the past without complications. She and her husband are very interested in having children, and currently (December 2018) they are not using any contraception.   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  Her husband Hannah Woodard (goes by [employer] is a Curator.  At home it's just them, a 51 year old [place] hound and a cat.   ADVANCED DIRECTIVES: In the absence of any documents to the contrary the patient's husband is her healthcare power of attorney   HEALTH MAINTENANCE: Social History   Tobacco Use   Smoking status: Never   Smokeless tobacco: Never  Vaping Use   Vaping status: Never Used  Substance Use Topics   Alcohol use: Not Currently    Comment: rarely    Drug use: No     Colonoscopy: Never  PAP:  Bone density:    Allergies  Allergen Reactions   Sulfamethoxazole-Trimethoprim Rash   Adhesive [Tape]    Milk-Related Compounds Diarrhea    Stomach cramping   Other Rash and Other (See Comments)    Frangrance   Poison Ivy Extract [Poison Ivy Extract] Hives, Itching and Rash    Current Outpatient Medications  Medication Sig Dispense Refill   anastrozole  (ARIMIDEX) 1 MG tablet Take 1 tablet (1 mg total) by mouth daily. 90 tablet 4   ascorbic acid (VITAMIN C) 1000 MG tablet Take by mouth.     b complex vitamins tablet Take 1 tablet by mouth daily.     Bioflavonoid Products (QUERCETIN COMPLEX IMMUNE PO) Take by mouth. W/ bromelain     Cholecalciferol (VITAMIN D3) 5000 units TABS Take 10,000 Units by mouth daily.     COLLAGEN PO Take by mouth daily. Takes beef collagen     desoximetasone (TOPICORT) 0.25 % cream Apply topically daily as needed.     fish oil-omega-3 fatty acids 1000 MG capsule Take 1 g by mouth daily.     gabapentin (NEURONTIN) 100 MG capsule Take 2 capsules (200 mg total) by mouth at bedtime. 90 capsule 3   glucosamine-chondroitin 500-400 MG tablet Take 3 tablets by mouth daily.     Green Tea 200 MG CAPS Take 1 capsule by mouth daily in the afternoon.     lactobacillus acidophilus (BACID) TABS tablet Take 2 tablets by mouth 3 (three) times daily.     Magnesium Citrate POWD Take by mouth daily.     meloxicam (MOBIC) 15 MG tablet TAKE 1 TABLET (  15 MG TOTAL) BY MOUTH DAILY. 30 tablet 0   meloxicam (MOBIC) 15 MG tablet Take 1 tablet (15 mg total) by mouth daily. 30 tablet 0   Menaquinone-7 (VITAMIN K2 PO) Take 150 mcg by mouth daily.     montelukast (SINGULAIR) 10 MG tablet TAKE 1 TABLET (10 MG) BY MOUTH EVERY EVENING     NEOMYCIN-POLYMYXIN-HYDROCORTISONE (CORTISPORIN) 1 % SOLN OTIC solution 4 drops 3 (three) times daily.     senna (SENOKOT) 8.6 MG tablet Take 1 tablet by mouth at bedtime as needed for constipation.     topiramate (TOPAMAX) 25 MG tablet Take 1 tablet (25 mg total) by mouth at bedtime. 30 tablet 3   triamcinolone ointment (KENALOG) 0.1 % Apply 1 application topically 2 (two) times daily. 80 g 1   TURMERIC PO Take 1 capsule by mouth as directed.      UNABLE TO FIND Take 500 mg by mouth daily. [place] Tail mushroom     UNABLE TO FIND Take 500 mg by mouth daily. [place] Mushroom     Current Facility-Administered  Medications  Medication Dose Route Frequency Provider Last Rate Last Admin   sodium chloride (PF) 0.9 % injection 2 mL  2 mL Intravenous PRN Raulkar, Drema Pry, MD        OBJECTIVE:  white woman who appears younger than stated age  There were no vitals filed for this visit.    There is no height or weight on file to calculate BMI.   Wt Readings from Last 3 Encounters:  06/07/23 167 lb (75.8 kg)  03/30/23 167 lb (75.8 kg)  03/09/23 168 lb 4 oz (76.3 kg)   ECOG FS:1 - Symptomatic but completely ambulatory  Physical Exam Constitutional:      Appearance: Normal appearance.  Chest:     Comments:  She is status post bilateral reduction mammoplasty with some asymmetry.  No palpable masses or regional adenopathy Musculoskeletal:        General: No swelling.     Cervical back: Normal range of motion and neck supple. No rigidity.  Lymphadenopathy:     Cervical: No cervical adenopathy.  Neurological:     Mental Status: She is alert.       LAB RESULTS:  CMP     Component Value Date/Time   NA 141 06/02/2022 1415   NA 138 09/01/2017 1238   K 3.8 06/02/2022 1415   K 4.4 09/01/2017 1238   CL 104 06/02/2022 1415   CO2 33 (H) 06/02/2022 1415   CO2 25 09/01/2017 1238   GLUCOSE 100 (H) 06/02/2022 1415   GLUCOSE 83 09/01/2017 1238   BUN 16 06/02/2022 1415   BUN 9.2 09/01/2017 1238   CREATININE 0.65 06/02/2022 1415   CREATININE 0.53 12/31/2020 1531   CREATININE 0.7 09/01/2017 1238   CALCIUM 9.9 06/02/2022 1415   CALCIUM 9.4 09/01/2017 1238   PROT 7.2 06/02/2022 1415   PROT 7.4 09/01/2017 1238   ALBUMIN 4.8 06/02/2022 1415   ALBUMIN 4.2 09/01/2017 1238   AST 19 06/02/2022 1415   AST 21 09/01/2017 1238   ALT 19 06/02/2022 1415   ALT 21 09/01/2017 1238   ALKPHOS 94 06/02/2022 1415   ALKPHOS 60 09/01/2017 1238   BILITOT 0.5 06/02/2022 1415   BILITOT 0.65 09/01/2017 1238   GFRNONAA >60 06/02/2022 1415   GFRNONAA 113 12/31/2020 1531   GFRAA 131 12/31/2020 1531    No results  found for: "TOTALPROTELP", "ALBUMINELP", "A1GS", "A2GS", "BETS", "BETA2SER", "GAMS", "MSPIKE", "SPEI"  No  results found for: "KPAFRELGTCHN", "LAMBDASER", "[facility]"  Lab Results  Component Value Date   WBC 4.7 06/02/2022   NEUTROABS 3.1 06/02/2022   HGB 13.3 06/02/2022   HCT 37.4 06/02/2022   MCV 95.4 06/02/2022   PLT 239 06/02/2022      Chemistry      Component Value Date/Time   NA 141 06/02/2022 1415   NA 138 09/01/2017 1238   K 3.8 06/02/2022 1415   K 4.4 09/01/2017 1238   CL 104 06/02/2022 1415   CO2 33 (H) 06/02/2022 1415   CO2 25 09/01/2017 1238   BUN 16 06/02/2022 1415   BUN 9.2 09/01/2017 1238   CREATININE 0.65 06/02/2022 1415   CREATININE 0.53 12/31/2020 1531   CREATININE 0.7 09/01/2017 1238      Component Value Date/Time   CALCIUM 9.9 06/02/2022 1415   CALCIUM 9.4 09/01/2017 1238   ALKPHOS 94 06/02/2022 1415   ALKPHOS 60 09/01/2017 1238   AST 19 06/02/2022 1415   AST 21 09/01/2017 1238   ALT 19 06/02/2022 1415   ALT 21 09/01/2017 1238   BILITOT 0.5 06/02/2022 1415   BILITOT 0.65 09/01/2017 1238       No results found for: "LABCA2"  No components found for: "HYQMVH846"  No results for input(s): "INR" in the last 168 hours.  No results found for: "LABCA2"  No results found for: "CAN199"  Lab Results  Component Value Date   CAN125 16.0 03/08/2023    No results found for: "NGE952"  No results found for: "CA2729"  No components found for: "HGQUANT"  No results found for: "CEA1", "CEA" / No results found for: "CEA1", "CEA"   No results found for: "AFPTUMOR"  No results found for: "CHROMOGRNA"  No results found for: "PSA1"  No visits with results within 3 Day(s) from this visit.  Latest known visit with results is:  Appointment on 03/08/2023  Component Date Value Ref Range Status   Cancer Antigen (CA) 125 03/08/2023 16.0  0.0 - 38.1 U/mL Final   Comment: (NOTE) Roche Diagnostics Electrochemiluminescence Immunoassay (ECLIA) Values  obtained with different assay methods or kits cannot be used interchangeably.  Results cannot be interpreted as absolute evidence of the presence or absence of malignant disease. Performed At: [facility] [address] [place], [place] [id number] Jolene Schimke MD UU:[id number]     (this displays the last labs from the last 3 days)  No results found for: "TOTALPROTELP", "ALBUMINELP", "A1GS", "A2GS", "BETS", "BETA2SER", "GAMS", "MSPIKE", "SPEI" (this displays SPEP labs)  No results found for: "KPAFRELGTCHN", "LAMBDASER", "KAPLAMBRATIO" (kappa/lambda light chains)  No results found for: "HGBA", "HGBA2QUANT", "HGBFQUANT", "HGBSQUAN" (Hemoglobinopathy evaluation)   No results found for: "LDH"  Lab Results  Component Value Date   IRON 87 12/31/2020   TIBC 276 12/31/2020   IRONPCTSAT 32 12/31/2020   (Iron and TIBC)  Lab Results  Component Value Date   FERRITIN 262 (H) 12/31/2020    Urinalysis    Component Value Date/Time   COLORURINE YELLOW 12/31/2020 1531   APPEARANCEUR CLEAR 12/31/2020 1531   LABSPEC 1.006 12/31/2020 1531   PHURINE 8.0 12/31/2020 1531   GLUCOSEU NEGATIVE 12/31/2020 1531   HGBUR NEGATIVE 12/31/2020 1531   BILIRUBINUR NEGATIVE 12/27/2017 1905   KETONESUR NEGATIVE 12/31/2020 1531   PROTEINUR NEGATIVE 12/31/2020 1531   UROBILINOGEN 0.2 12/06/2014 1529   NITRITE NEGATIVE 12/31/2020 1531   LEUKOCYTESUR 2+ (A) 12/31/2020 1531    STUDIES: No results found.  Bilateral diagnostic mammography with [facility] 11/15/2019  Calcifications in the far posterior right breast near the lumpectomy site. These would not be amenable to stereotactic biopsy given their location. A short-term follow-up is recommended.   Left breast mass, likely representing fat necrosis.   Breast composition: Scattered fibroglandular density.   BI-RADS Category: 3 - Probably benign findings.   RECOMMENDATION: Short Interval Follow-up with bilateral diagnostic  mammogram and possible left breast ultrasound in 6 months. Additionally, given that the patient has a far posterior lumpectomy site incompletely evaluated mammographically, yearly MRI is recommended.    ELIGIBLE FOR AVAILABLE RESEARCH PROTOCOL: No  ASSESSMENT: 52 y.o. Hannah Woodard woman status post right breast upper inner quadrant biopsy 08/24/2017 for a clinical T1c N0 invasive ductal carcinoma, triple positive, with an MIB-1 of 60%  (1) genetics testing 09/10/2017 through the Common Hereditary Cancer Panel + Melanoma Panel found no deleterious mutations in: APC, ATM, AXIN2, BAP1, BARD1, BMPR1A, BRCA1, BRCA2, BRIP1, CDH1, CDK4, CDKN2A (p14ARF), CDKN2A (p16INK4a), CHEK2, CTNNA1, DICER1, EPCAM*, GREM1*, KIT, MEN1, MLH1, MSH2, MSH3, MSH6, MUTYH, NBN, NF1, PALB2, PDGFRA, PMS2, POLD1, POLE, POT1, PTEN, RAD50, RAD51C, RAD51D, RB1, SDHB, SDHC, SDHD, SMAD4, SMARCA4, STK11, TP53, TSC1, TSC2, VHL. The following genes were evaluated for sequence changes only: HOXB13*, MITF*, NTHL1*, SDHA  (2) status post right lumpectomy and sentinel axillary lymph node sampling 10/07/2017 at [facility] for a pT1C pN0, stage IA invasive ductal carcinoma, grade 3, with negative margins  (a) 1 sentinel lymph node removed, bilateral oncoplastic breast reduction  (3) adjuvant chemotherapy consisting of paclitaxel weekly x12 and trastuzumab every 21 days STARTING 11/12/2017  (a) paclitaxel discontinued after 7 doses (last dose 12/24/2017) due to neuropathy  (4) continued trastuzumab to complete a year (through February 2020).  (a) baseline echocardiogram 09/10/2017 shows an ejection fraction of 55-60%  (b) echocardiogram 12/09/2017 showed an ejection fraction in the 60-65% range  (c) echo in 06/14/2018 shows EF of 60-65%  (d) echo on 10/17/2018 shows EF of 60-65%  (5) adjuvant radiation 02/03/2018 - 03/18/2018  Site/dose:   The patient initially received a dose of 50.4 Gy in 28 fractions to the breast using whole-breast  tangent fields. This was delivered using a 3-D conformal technique. The patient then received a boost to the seroma. This delivered an additional 10 Gy in 5 fractions using 9e electrons with a special teletherapy technique. The total dose was 60.4 Gy.   (6) anastrozole started 03/28/2018  (a) bone density 09/05/2018--normal  (b) patient refuses tamoxifen because of family history of strokes and uterine cancer  (c) breast cancer index predicts a risk of late recurrence of 10.6% and suggest significant benefit from continuing endocrine therapy for an extended period  (d) repeat bone density 10/30/2020 shows a T score of -0.8, normal  (7) goserelin started 09/29/2017, discontinued after 08/05/2018 dose, restarted on 11/04/2018 when her menses resumed  (a) changed to every 58-month dose beginning November 2021 Pt will continue anti estrogen therapy for 7 yrs, will complete in July 2026.   PLAN:  Hannah Woodard is here for follow-up on anastrozole and Zoladex.  Breast Cancer On anastrozole and receiving Zoladex injections every 12 weeks. Reports persistent pain in the breasts, likely due to fat necrosis and scar tissue. Mammogram at [facility] was [facility]. -Continue anastrozole and Zoladex as prescribed. -Renew anastrozole prescription at [facility].  Peripheral Neuropathy Reports frequent falls due to neuropathy. Currently on gabapentin 100mg , planning to wean off by March. Expressed interest in neuromuscular massage for symptom management. -Continue gabapentin as planned. -Explore options for neuromuscular massage.  Arthritis Diagnosed with arthritis in the big toe. Reports pain in the heel and toe. -Continue current management strategies.  Skin Concerns Reports dry patches and hyperpigmentation on the face, possibly melasma. Also reports a persistent blemish on the chest, likely benign. - Ok to try turmeric based soap that pt wanted to try. -Schedule dermatology appointment for further  evaluation.  General Health Maintenance -Continue physical therapy and exercises as tolerated. -Annual follow-up appointment scheduled for next year  Total time spent: 40 min  *Total Encounter Time as defined by the Centers for Medicare and Medicaid Services includes, in addition to the face-to-face time of a patient visit (documented in the note above) non-face-to-face time: obtaining and reviewing outside history, ordering and reviewing medications, tests or procedures, care coordination (communications with other health care professionals or caregivers) and documentation in the medical record.

## 2023-07-14 ENCOUNTER — Ambulatory Visit: Payer: BC Managed Care – PPO | Admitting: Rehabilitation

## 2023-07-16 ENCOUNTER — Ambulatory Visit: Payer: BC Managed Care – PPO | Admitting: Rehabilitation

## 2023-07-16 ENCOUNTER — Encounter: Payer: Self-pay | Admitting: Rehabilitation

## 2023-07-16 DIAGNOSIS — L599 Disorder of the skin and subcutaneous tissue related to radiation, unspecified: Secondary | ICD-10-CM

## 2023-07-16 DIAGNOSIS — Z483 Aftercare following surgery for neoplasm: Secondary | ICD-10-CM

## 2023-07-16 DIAGNOSIS — M25611 Stiffness of right shoulder, not elsewhere classified: Secondary | ICD-10-CM

## 2023-07-16 NOTE — Therapy (Signed)
OUTPATIENT PHYSICAL THERAPY  UPPER EXTREMITY ONCOLOGY Treatment  Patient Name: SEMRA WENSEL MRN: [id number] DOB:1970/11/28, 52 y.o., female Today's Date: 07/16/2023  END OF SESSION:  PT End of Session - 07/16/23 0858     Visit Number 5    Number of Visits 13    Date for PT Re-Evaluation 08/09/23    PT Start Time 0805    PT Stop Time 0858    PT Time Calculation (min) 53 min    Activity Tolerance Patient tolerated treatment well    Behavior During Therapy [facility] for tasks assessed/performed                Past Medical History:  Diagnosis Date   Breast cancer (HCC)    Family history of ovarian cancer 09/02/2017   Family history of prostate cancer in father 09/02/2017   Family history of uterine cancer 09/02/2017   Plantar fasciitis    Past Surgical History:  Procedure Laterality Date   PORT-A-CATH REMOVAL Left 2020   right lumpectomy, sentinel node biopsy, and bilateral mammoplasty  10/2017   [facility]   Patient Active Problem List   Diagnosis Date Noted   Overweight (BMI 25.0-29.9) 12/31/2020   Hyperlipidemia 12/30/2020   Neuropathy due to chemotherapeutic drug (HCC) 12/31/2017   Port-A-Cath in place 11/12/2017   Genetic testing 09/15/2017   Malignant neoplasm of upper-inner quadrant of right breast in female, estrogen receptor positive (HCC) 08/27/2017   Vitamin D deficiency 12/09/2015   Medication management 12/09/2015   Dense breast tissue 12/09/2015    PCP: Nadyne Coombes, MD  REFERRING PROVIDER: Sula Soda, MD  REFERRING DIAG:  Diagnosis  L90.5 (ICD-10-CM) - Scar tissue    THERAPY DIAG:  Aftercare following surgery for neoplasm  Disorder of the skin and subcutaneous tissue related to radiation, unspecified  Stiffness of right shoulder, not elsewhere classified  ONSET DATE: 2019  Rationale for Evaluation and Treatment: Rehabilitation  SUBJECTIVE:                                                                                                                                                                                            SUBJECTIVE STATEMENT:     I am doing okay.  I forgot to come until the last minute.  I ordered a shoulder pulley.    EVAL: tightness on bilateral incisions Rt>Lt which has been worse the past 6 months.  I also feel tight when I reach with my Rt hand.    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 1 negative node removed. Taxol  stopped due to neuropathy. Radiation completed.   PAIN:  Are you having pain? No  PRECAUTIONS: Rt lymphedema risk   RED FLAGS: None   WEIGHT BEARING RESTRICTIONS: No  FALLS:  Has patient fallen in last 6 months? Yes. Number of falls due to CIPN, x 3, bad with uneven ground   LIVING ENVIRONMENT: Lives with: lives with their family and lives with their spouse  OCCUPATION: works with hearing impaired for [employer]   LEISURE: walking dog 0.6 miles per day   HAND DOMINANCE: left   PRIOR LEVEL OF FUNCTION: Independent  PATIENT GOALS: Try and decrease pain   OBJECTIVE:  COGNITION: Overall cognitive status: Within functional limits for tasks assessed   PALPATION: Fat necrosis medial Rt breast, increased scar nodularity medial breasts bil  OBSERVATIONS / OTHER ASSESSMENTS: Lt breast larger than Rt due to radiation on the Rt.    POSTURE: rounded shoulders, forward head  UPPER EXTREMITY AROM/PROM:  A/PROM RIGHT   eval   Shoulder extension 52  Shoulder flexion 165  Shoulder abduction 162  Shoulder internal rotation   Shoulder external rotation 89 - slight pec tightness* / to 80 in supine with shoulder to 90deg of abduction    (Blank rows = not tested)  A/PROM LEFT   eval  Shoulder extension 40  Shoulder flexion 165  Shoulder abduction 165  Shoulder internal rotation   Shoulder external rotation 94 / to 90deg in supine with shoulder 90deg of abduction    (Blank  rows = not tested)  LYMPHEDEMA ASSESSMENTS:   LANDMARK RIGHT  eval  At axilla    15 cm proximal to olecranon process   10 cm proximal to olecranon process 27.6  Olecranon process 25.2  15 cm proximal to ulnar styloid process   10 cm proximal to ulnar styloid process 21.5  Just proximal to ulnar styloid process 16.5  Across hand at thumb web space 19  At base of 2nd digit 6.0  (Blank rows = not tested)  LANDMARK LEFT  eval  At axilla    15 cm proximal to olecranon process   10 cm proximal to olecranon process 28  Olecranon process 26.0  15 cm proximal to ulnar styloid process   10 cm proximal to ulnar styloid process 21.7  Just proximal to ulnar styloid process 16.6  Across hand at thumb web space 18.5  At base of 2nd digit 6.2  (Blank rows = not tested)  QUICK DASH SURVEY: 28%   TODAY'S TREATMENT:                                                                                                                                          DATE:  07/16/23 Pulleys x flexion and abduction 1/2 foam roller: snow angel x 10, alternating flexion x 10 bil , protraction/retraction x 10 Open book bil 5" x 5 with 1/2 FR under knee  to keep good positioning  Yellow scap series yellow x 5 each  Supine with arm overhead on pillow: STM Rt pectoralis with cocoa butter Scar work: sustained pressure of 2" up / down/ Rt/ Lt along border of incision on Rt breast PROM to the Rt shoulder to tolerance   07/08/23 Pulleys x flexion and abduction 1/2 foam roller: snow angel x 5, alternating flexion x 3 bil , protraction/retraction x 5, abduction stretch bil with Rt arm around 80deg and left around 55 deg Open book bil 5" x 5 with 1/2 FR under knee to keep good positioning  Supine with arm overhead on pillow: STM Rt pectoralis with cocoa butter Scar work: sustained pressure of 2" up / down/ Rt/ Lt along border of incision on Rt breast Then more kneading massage along the inferior  edge Opposing force pull across breast PROM to the Rt shoulder to tolerance   07/08/23 Pulleys x flexion and abduction 1/2 foam roller: snow angel x 5, alternating flexion x 3 bil , protraction/retraction x 5,  Open book bil 5" x 5  Supine with arm overhead on pillow: STM Rt pectoralis with cocoa butter Scar work: sustained pressure of 2" up / down/ Rt/ Lt along border of incision on Rt breast Then more kneading massage along the inferior edge Opposing force pull across breast PROM to the Rt shoulder to tolerance   PATIENT EDUCATION:  Education details: POC, new stretches Person educated: Patient Education method: Explanation, Facilities manager, and Handouts Education comprehension: verbalized understanding  HOME EXERCISE PROGRAM: [id number]   ASSESSMENT:  CLINICAL IMPRESSION:    Pt is doing well.  Added more strengthening with the band which was tolerated well as pt had done them previously with other PT    OBJECTIVE IMPAIRMENTS: decreased knowledge of condition, decreased ROM, increased edema, and increased fascial restrictions.   ACTIVITY LIMITATIONS: none  PARTICIPATION LIMITATIONS: none  PERSONAL FACTORS: Time since onset of injury/illness/exacerbation and 1 comorbidity: SLNB, radiation  are also affecting patient's functional outcome.   REHAB POTENTIAL: Good  CLINICAL DECISION MAKING: Stable/uncomplicated  EVALUATION COMPLEXITY: Moderate  GOALS: Goals reviewed with patient? Yes  SHORT TERM GOALS=LTGs: Target date: 08/09/23  Pt will report reaching in the car is improved by at least 50%  Baseline: Goal status: INITIAL  2.  Pt will be ind with self massage for the breast incisions Baseline:  Goal status: INITIAL  3.  Pt will be given information for second to nature for bras as needed Baseline:  Goal status: INITIAL  4.  Pt will improve Rt shoulder AROM to 90deg to show improved mobility.  Baseline:  Goal status: INITIAL   PLAN:  PT FREQUENCY:  1-2x/week  PT DURATION: 6 weeks  PLANNED INTERVENTIONS: Therapeutic exercises, Therapeutic activity, Patient/Family education, Self Care, Joint mobilization, Manual therapy, and Re-evaluation, DN  PLAN FOR NEXT SESSION: Rt shoulder MFR: pec, lat, PROM, stretching as needed, scar massage education, cupping, taping, DN as needed,   Idamae Lusher, PT 07/16/2023, 8:58 AM

## 2023-07-20 ENCOUNTER — Encounter: Payer: Self-pay | Admitting: Rehabilitation

## 2023-07-20 ENCOUNTER — Ambulatory Visit: Payer: BC Managed Care – PPO | Admitting: Rehabilitation

## 2023-07-20 DIAGNOSIS — Z483 Aftercare following surgery for neoplasm: Secondary | ICD-10-CM

## 2023-07-20 DIAGNOSIS — L599 Disorder of the skin and subcutaneous tissue related to radiation, unspecified: Secondary | ICD-10-CM

## 2023-07-20 DIAGNOSIS — M25611 Stiffness of right shoulder, not elsewhere classified: Secondary | ICD-10-CM

## 2023-07-20 NOTE — Therapy (Signed)
OUTPATIENT PHYSICAL THERAPY  UPPER EXTREMITY ONCOLOGY Treatment  Patient Name: Hannah Woodard MRN: [id number] DOB:04/28/1971, 52 y.o., female Today's Date: 07/20/2023  END OF SESSION:  PT End of Session - 07/20/23 1306     Visit Number 6    Number of Visits 13    Date for PT Re-Evaluation 08/09/23    PT Start Time 1308    PT Stop Time 1352    PT Time Calculation (min) 44 min    Activity Tolerance Patient tolerated treatment well    Behavior During Therapy [facility] for tasks assessed/performed                Past Medical History:  Diagnosis Date   Breast cancer (HCC)    Family history of ovarian cancer 09/02/2017   Family history of prostate cancer in father 09/02/2017   Family history of uterine cancer 09/02/2017   Plantar fasciitis    Past Surgical History:  Procedure Laterality Date   PORT-A-CATH REMOVAL Left 2020   right lumpectomy, sentinel node biopsy, and bilateral mammoplasty  10/2017   [facility]   Patient Active Problem List   Diagnosis Date Noted   Overweight (BMI 25.0-29.9) 12/31/2020   Hyperlipidemia 12/30/2020   Neuropathy due to chemotherapeutic drug (HCC) 12/31/2017   Port-A-Cath in place 11/12/2017   Genetic testing 09/15/2017   Malignant neoplasm of upper-inner quadrant of right breast in female, estrogen receptor positive (HCC) 08/27/2017   Vitamin D deficiency 12/09/2015   Medication management 12/09/2015   Dense breast tissue 12/09/2015    PCP: Nadyne Coombes, MD  REFERRING PROVIDER: Sula Soda, MD  REFERRING DIAG:  Diagnosis  L90.5 (ICD-10-CM) - Scar tissue    THERAPY DIAG:  Aftercare following surgery for neoplasm  Disorder of the skin and subcutaneous tissue related to radiation, unspecified  Stiffness of right shoulder, not elsewhere classified  ONSET DATE: 2019  Rationale for Evaluation and Treatment: Rehabilitation  SUBJECTIVE:                                                                                                                                                                                            SUBJECTIVE STATEMENT:     I fell on my right knee and onto my Rt hand so the Rt shoulder blade is sore.  The breast doesn't feel much different.    EVAL: tightness on bilateral incisions Rt>Lt which has been worse the past 6 months.  I also feel tight when I reach with my Rt hand.    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 Rt breast IDC with lumpectomy with reduction and SLNB in  2019. 1 negative node removed. Taxol stopped due to neuropathy. Radiation completed.   PAIN:  PAIN:  Are you having pain? Yes: NPRS scale: 3-4/10 Pain location: Rt shoulder blade Pain description: sore Aggravating factors: nothing really Relieving factors: I'll stretch it , Tylenol.     PRECAUTIONS: Rt lymphedema risk   RED FLAGS: None   WEIGHT BEARING RESTRICTIONS: No  FALLS:  Has patient fallen in last 6 months? Yes. Number of falls due to CIPN, x 3, bad with uneven ground   LIVING ENVIRONMENT: Lives with: lives with their family and lives with their spouse  OCCUPATION: works with hearing impaired for [employer]   LEISURE: walking dog 0.6 miles per day   HAND DOMINANCE: left   PRIOR LEVEL OF FUNCTION: Independent  PATIENT GOALS: Try and decrease pain   OBJECTIVE:  COGNITION: Overall cognitive status: Within functional limits for tasks assessed   PALPATION: Fat necrosis medial Rt breast, increased scar nodularity medial breasts bil  OBSERVATIONS / OTHER ASSESSMENTS: Lt breast larger than Rt due to radiation on the Rt.    POSTURE: rounded shoulders, forward head  UPPER EXTREMITY AROM/PROM:  A/PROM RIGHT   eval   Shoulder extension 52  Shoulder flexion 165  Shoulder abduction 162  Shoulder internal rotation   Shoulder external rotation 89 - slight pec tightness* / to 80 in supine with shoulder to 90deg of abduction    (Blank rows = not tested)  A/PROM LEFT    eval  Shoulder extension 40  Shoulder flexion 165  Shoulder abduction 165  Shoulder internal rotation   Shoulder external rotation 94 / to 90deg in supine with shoulder 90deg of abduction    (Blank rows = not tested)  LYMPHEDEMA ASSESSMENTS:   LANDMARK RIGHT  eval  At axilla    15 cm proximal to olecranon process   10 cm proximal to olecranon process 27.6  Olecranon process 25.2  15 cm proximal to ulnar styloid process   10 cm proximal to ulnar styloid process 21.5  Just proximal to ulnar styloid process 16.5  Across hand at thumb web space 19  At base of 2nd digit 6.0  (Blank rows = not tested)  LANDMARK LEFT  eval  At axilla    15 cm proximal to olecranon process   10 cm proximal to olecranon process 28  Olecranon process 26.0  15 cm proximal to ulnar styloid process   10 cm proximal to ulnar styloid process 21.7  Just proximal to ulnar styloid process 16.6  Across hand at thumb web space 18.5  At base of 2nd digit 6.2  (Blank rows = not tested)  QUICK DASH SURVEY: 28%   TODAY'S TREATMENT:                                                                                                                                          DATE:  07/20/23  1/2 foam roller: snow angel x 10, alternating flexion x 10 bil , alternating horizontal abd x 10 (easy)  Yellow scap series yellow x 10 each with cueing for reminder of motion for each - vcs to slow down Supine with arm overhead on pillow: STM Rt pectoralis with cocoa butter Scar work: sustained pressure of 2" up / down/ Rt/ Lt along border of incision on Rt breast and cross motion and cross hands pressure release with focus on middle incision where necrosis is.   07/16/23 Pulleys x flexion and abduction 1/2 foam roller: snow angel x 10, alternating flexion x 10 bil , protraction/retraction x 10 Open book bil 5" x 5 with 1/2 FR under knee to keep good positioning  Yellow scap series yellow x 5 each  Supine with arm  overhead on pillow: STM Rt pectoralis with cocoa butter Scar work: sustained pressure of 2" up / down/ Rt/ Lt along border of incision on Rt breast PROM to the Rt shoulder to tolerance   07/08/23 Pulleys x flexion and abduction 1/2 foam roller: snow angel x 5, alternating flexion x 3 bil , protraction/retraction x 5, abduction stretch bil with Rt arm around 80deg and left around 55 deg Open book bil 5" x 5 with 1/2 FR under knee to keep good positioning  Supine with arm overhead on pillow: STM Rt pectoralis with cocoa butter Scar work: sustained pressure of 2" up / down/ Rt/ Lt along border of incision on Rt breast Then more kneading massage along the inferior edge Opposing force pull across breast PROM to the Rt shoulder to tolerance   07/08/23 Pulleys x flexion and abduction 1/2 foam roller: snow angel x 5, alternating flexion x 3 bil , protraction/retraction x 5,  Open book bil 5" x 5  Supine with arm overhead on pillow: STM Rt pectoralis with cocoa butter Scar work: sustained pressure of 2" up / down/ Rt/ Lt along border of incision on Rt breast Then more kneading massage along the inferior edge Opposing force pull across breast PROM to the Rt shoulder to tolerance   PATIENT EDUCATION:  Education details: POC, new stretches Person educated: Patient Education method: Explanation, Facilities manager, and Handouts Education comprehension: verbalized understanding  HOME EXERCISE PROGRAM: [id number]   ASSESSMENT:  CLINICAL IMPRESSION:    Pt is doing well.  She had fallen which increased some Rt posterior shoulder pain but no changes in the breast. Lt side exhibited no tightness today with manual assessment.   OBJECTIVE IMPAIRMENTS: decreased knowledge of condition, decreased ROM, increased edema, and increased fascial restrictions.   ACTIVITY LIMITATIONS: none  PARTICIPATION LIMITATIONS: none  PERSONAL FACTORS: Time since onset of injury/illness/exacerbation and 1  comorbidity: SLNB, radiation  are also affecting patient's functional outcome.   REHAB POTENTIAL: Good  CLINICAL DECISION MAKING: Stable/uncomplicated  EVALUATION COMPLEXITY: Moderate  GOALS: Goals reviewed with patient? Yes  SHORT TERM GOALS=LTGs: Target date: 08/09/23  Pt will report reaching in the car is improved by at least 50%  Baseline: Goal status: INITIAL  2.  Pt will be ind with self massage for the breast incisions Baseline:  Goal status: INITIAL  3.  Pt will be given information for second to nature for bras as needed Baseline:  Goal status: INITIAL  4.  Pt will improve Rt shoulder AROM to 90deg to show improved mobility.  Baseline:  Goal status: INITIAL   PLAN:  PT FREQUENCY: 1-2x/week  PT DURATION: 6 weeks  PLANNED INTERVENTIONS: Therapeutic exercises, Therapeutic activity,  Patient/Family education, Self Care, Joint mobilization, Manual therapy, and Re-evaluation, DN  PLAN FOR NEXT SESSION: Rt shoulder MFR: pec, lat, PROM, stretching as needed, scar massage education, cupping, taping, DN as needed,   Idamae Lusher, PT 07/20/2023, 1:52 PM

## 2023-07-22 ENCOUNTER — Ambulatory Visit: Payer: BC Managed Care – PPO

## 2023-07-22 DIAGNOSIS — Z483 Aftercare following surgery for neoplasm: Secondary | ICD-10-CM

## 2023-07-22 DIAGNOSIS — L599 Disorder of the skin and subcutaneous tissue related to radiation, unspecified: Secondary | ICD-10-CM

## 2023-07-22 DIAGNOSIS — M25611 Stiffness of right shoulder, not elsewhere classified: Secondary | ICD-10-CM

## 2023-07-22 NOTE — Therapy (Addendum)
OUTPATIENT PHYSICAL THERAPY  UPPER EXTREMITY ONCOLOGY Treatment  Patient Name: Hannah Woodard MRN: [id number] DOB:Feb 08, 1971, 52 y.o., female Today's Date: 07/22/2023  END OF SESSION:  PT End of Session - 07/22/23 1504     Visit Number 7    Number of Visits 13    Date for PT Re-Evaluation 08/09/23    PT Start Time 1505    PT Stop Time 1555    PT Time Calculation (min) 50 min    Activity Tolerance Patient tolerated treatment well    Behavior During Therapy [facility] for tasks assessed/performed                Past Medical History:  Diagnosis Date   Breast cancer (HCC)    Family history of ovarian cancer 09/02/2017   Family history of prostate cancer in father 09/02/2017   Family history of uterine cancer 09/02/2017   Plantar fasciitis    Past Surgical History:  Procedure Laterality Date   PORT-A-CATH REMOVAL Left 2020   right lumpectomy, sentinel node biopsy, and bilateral mammoplasty  10/2017   [facility]   Patient Active Problem List   Diagnosis Date Noted   Overweight (BMI 25.0-29.9) 12/31/2020   Hyperlipidemia 12/30/2020   Neuropathy due to chemotherapeutic drug (HCC) 12/31/2017   Port-A-Cath in place 11/12/2017   Genetic testing 09/15/2017   Malignant neoplasm of upper-inner quadrant of right breast in female, estrogen receptor positive (HCC) 08/27/2017   Vitamin D deficiency 12/09/2015   Medication management 12/09/2015   Dense breast tissue 12/09/2015    PCP: Nadyne Coombes, MD  REFERRING PROVIDER: Sula Soda, MD  REFERRING DIAG:  Diagnosis  L90.5 (ICD-10-CM) - Scar tissue    THERAPY DIAG:  Aftercare following surgery for neoplasm  Disorder of the skin and subcutaneous tissue related to radiation, unspecified  Stiffness of right shoulder, not elsewhere classified  ONSET DATE: 2019  Rationale for Evaluation and Treatment: Rehabilitation  SUBJECTIVE:                                                                                                                                                                                            SUBJECTIVE STATEMENT:     Had another fall at work yesterday and hit my left knee. I spoke to the PT at [facility]  and they suggested I continue therapy for balance after I complete my knee therapy there. I started to wean off of Gabapentin in July. I have pain at top of shoulder when I try to raise my right arm. I haven't had good shoulder ROM for the last 2 days.  EVAL: tightness on bilateral incisions Rt>Lt  which has been worse the past 6 months.  I also feel tight when I reach with my Rt hand.    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 1 negative node removed. Taxol stopped due to neuropathy. Radiation completed.   PAIN:  PAIN:  Are you having pain? Yes: NPRS scale: 3-4/10 Pain location: Rt and left shoulder Pain description: sore Aggravating factors: nothing really Relieving factors: I'll stretch it , Tylenol.     PRECAUTIONS: Rt lymphedema risk   RED FLAGS: None   WEIGHT BEARING RESTRICTIONS: No  FALLS:  Has patient fallen in last 6 months? Yes. Number of falls due to CIPN, x 3, bad with uneven ground   LIVING ENVIRONMENT: Lives with: lives with their family and lives with their spouse  OCCUPATION: works with hearing impaired for [employer]   LEISURE: walking dog 0.6 miles per day   HAND DOMINANCE: left   PRIOR LEVEL OF FUNCTION: Independent  PATIENT GOALS: Try and decrease pain   OBJECTIVE:  COGNITION: Overall cognitive status: Within functional limits for tasks assessed   PALPATION: Fat necrosis medial Rt breast, increased scar nodularity medial breasts bil  OBSERVATIONS / OTHER ASSESSMENTS: Lt breast larger than Rt due to radiation on the Rt.    POSTURE: rounded shoulders, forward head  UPPER EXTREMITY AROM/PROM:  A/PROM RIGHT   eval   Shoulder extension 52   Shoulder flexion 165  Shoulder abduction 162  Shoulder internal rotation   Shoulder external rotation 89 - slight pec tightness* / to 80 in supine with shoulder to 90deg of abduction    (Blank rows = not tested)  A/PROM LEFT   eval  Shoulder extension 40  Shoulder flexion 165  Shoulder abduction 165  Shoulder internal rotation   Shoulder external rotation 94 / to 90deg in supine with shoulder 90deg of abduction    (Blank rows = not tested)  LYMPHEDEMA ASSESSMENTS:   LANDMARK RIGHT  eval  At axilla    15 cm proximal to olecranon process   10 cm proximal to olecranon process 27.6  Olecranon process 25.2  15 cm proximal to ulnar styloid process   10 cm proximal to ulnar styloid process 21.5  Just proximal to ulnar styloid process 16.5  Across hand at thumb web space 19  At base of 2nd digit 6.0  (Blank rows = not tested)  LANDMARK LEFT  eval  At axilla    15 cm proximal to olecranon process   10 cm proximal to olecranon process 28  Olecranon process 26.0  15 cm proximal to ulnar styloid process   10 cm proximal to ulnar styloid process 21.7  Just proximal to ulnar styloid process 16.6  Across hand at thumb web space 18.5  At base of 2nd digit 6.2  (Blank rows = not tested)  QUICK DASH SURVEY: 28%   TODAY'S TREATMENT:  DATE:   07/22/2023 1/2 foam roller: snow angel x 10, alternating flexion x 10 bil , alternating horizontal abd x 10 (easy)  Yellow scap series y  on foam roll x 10 each with cueing for reminder of motion for each - pt used good speed today 1/2 foam roller: snow angel x 10, alternating flexion x 10 bil , alternating horizontal abd x 10 (easy)  Yellow scap series on foam roll  x 10 each with cueing for reminder of motion for each - vcs to slow down STM left UT, pectorals, scapular region with cocoa butter, Repeated on  right as on left Scar work right breast along border of icision, avoiding area of skin irritation. Discussed second to [facility] for bras however per pt. they don't make them large enough. (Size G) 07/20/23 1/2 foam roller: snow angel x 10, alternating flexion x 10 bil , alternating horizontal abd x 10 (easy)  Yellow scap series yellow x 10 each with cueing for reminder of motion for each - vcs to slow down Supine with arm overhead on pillow: STM Rt pectoralis with cocoa butter Scar work: sustained pressure of 2" up / down/ Rt/ Lt along border of incision on Rt breast and cross motion and cross hands pressure release with focus on middle incision where necrosis is.   07/16/23 Pulleys x flexion and abduction 1/2 foam roller: snow angel x 10, alternating flexion x 10 bil , protraction/retraction x 10 Open book bil 5" x 5 with 1/2 FR under knee to keep good positioning  Yellow scap series yellow x 5 each  Supine with arm overhead on pillow: STM Rt pectoralis with cocoa butter Scar work: sustained pressure of 2" up / down/ Rt/ Lt along border of incision on Rt breast PROM to the Rt shoulder to tolerance   07/08/23 Pulleys x flexion and abduction 1/2 foam roller: snow angel x 5, alternating flexion x 3 bil , protraction/retraction x 5, abduction stretch bil with Rt arm around 80deg and left around 55 deg Open book bil 5" x 5 with 1/2 FR under knee to keep good positioning  Supine with arm overhead on pillow: STM Rt pectoralis with cocoa butter Scar work: sustained pressure of 2" up / down/ Rt/ Lt along border of incision on Rt breast Then more kneading massage along the inferior edge Opposing force pull across breast PROM to the Rt shoulder to tolerance   07/08/23 Pulleys x flexion and abduction 1/2 foam roller: snow angel x 5, alternating flexion x 3 bil , protraction/retraction x 5,  Open book bil 5" x 5  Supine with arm overhead on pillow: STM Rt pectoralis with cocoa  butter Scar work: sustained pressure of 2" up / down/ Rt/ Lt along border of incision on Rt breast Then more kneading massage along the inferior edge Opposing force pull across breast PROM to the Rt shoulder to tolerance   PATIENT EDUCATION:  Education details: POC, new stretches Person educated: Patient Education method: Programmer, multimedia, Facilities manager, and Handouts Education comprehension: verbalized understanding  HOME EXERCISE PROGRAM: [id number]   ASSESSMENT: Pt had some bilateral scapular pain after foam roll with scapular series but worked on this area with STM and pt felt good at completion of rx. Shoulder ROM was full after several reps of each motion without complaint of shoulder pain. Slight skin irritation noted under right breast.  CLINICAL IMPRESSION:     OBJECTIVE IMPAIRMENTS: decreased knowledge of condition, decreased ROM, increased edema, and increased fascial restrictions.  ACTIVITY LIMITATIONS: none  PARTICIPATION LIMITATIONS: none  PERSONAL FACTORS: Time since onset of injury/illness/exacerbation and 1 comorbidity: SLNB, radiation  are also affecting patient's functional outcome.   REHAB POTENTIAL: Good  CLINICAL DECISION MAKING: Stable/uncomplicated  EVALUATION COMPLEXITY: Moderate  GOALS: Goals reviewed with patient? Yes  SHORT TERM GOALS=LTGs: Target date: 08/09/23  Pt will report reaching in the car is improved by at least 50%  Baseline: Goal status: INITIAL  2.  Pt will be ind with self massage for the breast incisions Baseline:  Goal status: INITIAL  3.  Pt will be given information for [employer] to [employer] for bras as needed Baseline:  Goal status: INITIAL  4.  Pt will improve Rt shoulder AROM to 90deg to show improved mobility.  Baseline:  Goal status: INITIAL   PLAN:  PT FREQUENCY: 1-2x/week  PT DURATION: 6 weeks  PLANNED INTERVENTIONS: Therapeutic exercises, Therapeutic activity, Patient/Family education, Self Care, Joint  mobilization, Manual therapy, and Re-evaluation, DN  PLAN FOR NEXT SESSION: foam roll, Rt shoulder MFR: pec, lat, PROM, stretching as needed, scar massage education, cupping, taping, DN as needed,   Waynette Buttery, PT 07/22/2023, 4:15 PM

## 2023-07-27 ENCOUNTER — Encounter: Payer: BC Managed Care – PPO | Admitting: Rehabilitation

## 2023-07-29 ENCOUNTER — Encounter: Payer: Self-pay | Admitting: Rehabilitation

## 2023-07-29 ENCOUNTER — Ambulatory Visit: Payer: BC Managed Care – PPO | Admitting: Rehabilitation

## 2023-07-29 DIAGNOSIS — Z483 Aftercare following surgery for neoplasm: Secondary | ICD-10-CM

## 2023-07-29 DIAGNOSIS — M25611 Stiffness of right shoulder, not elsewhere classified: Secondary | ICD-10-CM

## 2023-07-29 DIAGNOSIS — L599 Disorder of the skin and subcutaneous tissue related to radiation, unspecified: Secondary | ICD-10-CM

## 2023-07-29 NOTE — Therapy (Signed)
OUTPATIENT PHYSICAL THERAPY  UPPER EXTREMITY ONCOLOGY Treatment  Patient Name: Hannah Woodard MRN: [id number] DOB:09-15-1971, 52 y.o., female Today's Date: 07/29/2023  END OF SESSION:  PT End of Session - 07/29/23 1558     Visit Number 8    Number of Visits 13    Date for PT Re-Evaluation 08/09/23    PT Start Time 1508    PT Stop Time 1557    PT Time Calculation (min) 49 min    Activity Tolerance Patient tolerated treatment well    Behavior During Therapy [facility] for tasks assessed/performed                 Past Medical History:  Diagnosis Date   Breast cancer (HCC)    Family history of ovarian cancer 09/02/2017   Family history of prostate cancer in father 09/02/2017   Family history of uterine cancer 09/02/2017   Plantar fasciitis    Past Surgical History:  Procedure Laterality Date   PORT-A-CATH REMOVAL Left 2020   right lumpectomy, sentinel node biopsy, and bilateral mammoplasty  10/2017   [facility]   Patient Active Problem List   Diagnosis Date Noted   Overweight (BMI 25.0-29.9) 12/31/2020   Hyperlipidemia 12/30/2020   Neuropathy due to chemotherapeutic drug (HCC) 12/31/2017   Port-A-Cath in place 11/12/2017   Genetic testing 09/15/2017   Malignant neoplasm of upper-inner quadrant of right breast in female, estrogen receptor positive (HCC) 08/27/2017   Vitamin D deficiency 12/09/2015   Medication management 12/09/2015   Dense breast tissue 12/09/2015    PCP: Nadyne Coombes, MD  REFERRING PROVIDER: Sula Soda, MD  REFERRING DIAG:  Diagnosis  L90.5 (ICD-10-CM) - Scar tissue    THERAPY DIAG:  Aftercare following surgery for neoplasm  Disorder of the skin and subcutaneous tissue related to radiation, unspecified  Stiffness of right shoulder, not elsewhere classified  ONSET DATE: 2019  Rationale for Evaluation and Treatment: Rehabilitation  SUBJECTIVE:                                                                                                                                                                                            SUBJECTIVE STATEMENT:   I am just sore today.    EVAL: tightness on bilateral incisions Rt>Lt which has been worse the past 6 months.  I also feel tight when I reach with my Rt hand.    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 1 negative node removed. Taxol stopped due to neuropathy. Radiation completed.   PAIN:  PAIN:  Are you having  pain? Yes: NPRS scale: 3-4/10 Pain location: Rt and left shoulder Pain description: sore Aggravating factors: nothing really Relieving factors: I'll stretch it , Tylenol.     PRECAUTIONS: Rt lymphedema risk   RED FLAGS: None   WEIGHT BEARING RESTRICTIONS: No  FALLS:  Has patient fallen in last 6 months? Yes. Number of falls due to CIPN, x 3, bad with uneven ground   LIVING ENVIRONMENT: Lives with: lives with their family and lives with their spouse  OCCUPATION: works with hearing impaired for [employer]   LEISURE: walking dog 0.6 miles per day   HAND DOMINANCE: left   PRIOR LEVEL OF FUNCTION: Independent  PATIENT GOALS: Try and decrease pain   OBJECTIVE:  COGNITION: Overall cognitive status: Within functional limits for tasks assessed   PALPATION: Fat necrosis medial Rt breast, increased scar nodularity medial breasts bil  OBSERVATIONS / OTHER ASSESSMENTS: Lt breast larger than Rt due to radiation on the Rt.    POSTURE: rounded shoulders, forward head  UPPER EXTREMITY AROM/PROM:  A/PROM RIGHT   eval   Shoulder extension 52  Shoulder flexion 165  Shoulder abduction 162  Shoulder internal rotation   Shoulder external rotation 89 - slight pec tightness* / to 80 in supine with shoulder to 90deg of abduction    (Blank rows = not tested)  A/PROM LEFT   eval  Shoulder extension 40  Shoulder flexion 165  Shoulder abduction 165  Shoulder  internal rotation   Shoulder external rotation 94 / to 90deg in supine with shoulder 90deg of abduction    (Blank rows = not tested)  LYMPHEDEMA ASSESSMENTS:   LANDMARK RIGHT  eval  At axilla    15 cm proximal to olecranon process   10 cm proximal to olecranon process 27.6  Olecranon process 25.2  15 cm proximal to ulnar styloid process   10 cm proximal to ulnar styloid process 21.5  Just proximal to ulnar styloid process 16.5  Across hand at thumb web space 19  At base of 2nd digit 6.0  (Blank rows = not tested)  LANDMARK LEFT  eval  At axilla    15 cm proximal to olecranon process   10 cm proximal to olecranon process 28  Olecranon process 26.0  15 cm proximal to ulnar styloid process   10 cm proximal to ulnar styloid process 21.7  Just proximal to ulnar styloid process 16.6  Across hand at thumb web space 18.5  At base of 2nd digit 6.2  (Blank rows = not tested)  QUICK DASH SURVEY: 28%   TODAY'S TREATMENT:                                                                                                                                          DATE:  07/29/2023 Pulleys warm up x flexion and abduction  1/2 foam roller: snow angel x 10, alternating flexion x 10  bil  Yellow scap series  x 10 each  Protraction/Retraction 2# bil x 10 STM left UT, pectorals, scapular region with cocoa butter, Repeated on right as on left Scar work right breast along border of incision  07/22/2023 1/2 foam roller: snow angel x 10, alternating flexion x 10 bil , alternating horizontal abd x 10 (easy)  Yellow scap series y  on foam roll x 10 each with cueing for reminder of motion for each - pt used good speed today 1/2 foam roller: snow angel x 10, alternating flexion x 10 bil , alternating horizontal abd x 10 (easy)  Yellow scap series on foam roll  x 10 each with cueing for reminder of motion for each - vcs to slow down STM left UT, pectorals, scapular region with cocoa butter,  Repeated on right as on left Scar work right breast along border of icision, avoiding area of skin irritation. Discussed second to [facility] for bras however per pt. they don't make them large enough. (Size G)  07/20/23 1/2 foam roller: snow angel x 10, alternating flexion x 10 bil , alternating horizontal abd x 10 (easy)  Yellow scap series yellow x 10 each with cueing for reminder of motion for each - vcs to slow down Supine with arm overhead on pillow: STM Rt pectoralis with cocoa butter Scar work: sustained pressure of 2" up / down/ Rt/ Lt along border of incision on Rt breast and cross motion and cross hands pressure release with focus on middle incision where necrosis is.   07/16/23 Pulleys x flexion and abduction 1/2 foam roller: snow angel x 10, alternating flexion x 10 bil , protraction/retraction x 10 Open book bil 5" x 5 with 1/2 FR under knee to keep good positioning  Yellow scap series yellow x 5 each  Supine with arm overhead on pillow: STM Rt pectoralis with cocoa butter Scar work: sustained pressure of 2" up / down/ Rt/ Lt along border of incision on Rt breast PROM to the Rt shoulder to tolerance   07/08/23 Pulleys x flexion and abduction 1/2 foam roller: snow angel x 5, alternating flexion x 3 bil , protraction/retraction x 5, abduction stretch bil with Rt arm around 80deg and left around 55 deg Open book bil 5" x 5 with 1/2 FR under knee to keep good positioning  Supine with arm overhead on pillow: STM Rt pectoralis with cocoa butter Scar work: sustained pressure of 2" up / down/ Rt/ Lt along border of incision on Rt breast Then more kneading massage along the inferior edge Opposing force pull across breast PROM to the Rt shoulder to tolerance   07/08/23 Pulleys x flexion and abduction 1/2 foam roller: snow angel x 5, alternating flexion x 3 bil , protraction/retraction x 5,  Open book bil 5" x 5  Supine with arm overhead on pillow: STM Rt pectoralis  with cocoa butter Scar work: sustained pressure of 2" up / down/ Rt/ Lt along border of incision on Rt breast Then more kneading massage along the inferior edge Opposing force pull across breast PROM to the Rt shoulder to tolerance   PATIENT EDUCATION:  Education details: POC, new stretches Person educated: Patient Education method: Programmer, multimedia, Facilities manager, and Handouts Education comprehension: verbalized understanding  HOME EXERCISE PROGRAM: [id number]   ASSESSMENT: CLINICAL IMPRESSION:    Overall scar tissue is maintaining the same amount of pain.  Discussed how no significant changes seem to be occurring and how she may continue to just have  some pain with her incision on the Rt where the necrosis remains.    OBJECTIVE IMPAIRMENTS: decreased knowledge of condition, decreased ROM, increased edema, and increased fascial restrictions.   ACTIVITY LIMITATIONS: none  PARTICIPATION LIMITATIONS: none  PERSONAL FACTORS: Time since onset of injury/illness/exacerbation and 1 comorbidity: SLNB, radiation  are also affecting patient's functional outcome.   REHAB POTENTIAL: Good  CLINICAL DECISION MAKING: Stable/uncomplicated  EVALUATION COMPLEXITY: Moderate  GOALS: Goals reviewed with patient? Yes  SHORT TERM GOALS=LTGs: Target date: 08/09/23  Pt will report reaching in the car is improved by at least 50%  Baseline: Goal status: INITIAL  2.  Pt will be ind with self massage for the breast incisions Baseline:  Goal status: INITIAL  3.  Pt will be given information for [employer] for bras as needed Baseline:  Goal status: INITIAL  4.  Pt will improve Rt shoulder AROM to 90deg to show improved mobility.  Baseline:  Goal status: INITIAL   PLAN:  PT FREQUENCY: 1-2x/week  PT DURATION: 6 weeks  PLANNED INTERVENTIONS: Therapeutic exercises, Therapeutic activity, Patient/Family education, Self Care, Joint mobilization, Manual therapy, and Re-evaluation, DN  PLAN FOR  NEXT SESSION: foam roll, Rt shoulder MFR: pec, lat, PROM, stretching as needed, scar massage education, cupping, taping, DN as needed,   Idamae Lusher, PT 07/29/2023, 3:59 PM

## 2023-07-30 ENCOUNTER — Encounter: Payer: Self-pay | Admitting: Hematology and Oncology

## 2023-07-30 ENCOUNTER — Other Ambulatory Visit: Payer: Self-pay | Admitting: *Deleted

## 2023-07-30 DIAGNOSIS — C50211 Malignant neoplasm of upper-inner quadrant of right female breast: Secondary | ICD-10-CM

## 2023-07-30 DIAGNOSIS — Z79811 Long term (current) use of aromatase inhibitors: Secondary | ICD-10-CM

## 2023-08-03 ENCOUNTER — Ambulatory Visit: Payer: BC Managed Care – PPO | Attending: Physical Medicine and Rehabilitation

## 2023-08-03 DIAGNOSIS — L599 Disorder of the skin and subcutaneous tissue related to radiation, unspecified: Secondary | ICD-10-CM | POA: Diagnosis present

## 2023-08-03 DIAGNOSIS — Z483 Aftercare following surgery for neoplasm: Secondary | ICD-10-CM | POA: Insufficient documentation

## 2023-08-03 DIAGNOSIS — M25611 Stiffness of right shoulder, not elsewhere classified: Secondary | ICD-10-CM | POA: Diagnosis present

## 2023-08-03 NOTE — Therapy (Signed)
OUTPATIENT PHYSICAL THERAPY  UPPER EXTREMITY ONCOLOGY Treatment  Patient Name: PALESTINE MOSCO MRN: [id number] DOB:02-20-1971, 52 y.o., female Today's Date: 08/03/2023  END OF SESSION:  PT End of Session - 08/03/23 1502     Visit Number 9    Number of Visits 13    Date for PT Re-Evaluation 08/09/23    PT Start Time 1503    PT Stop Time 1555    PT Time Calculation (min) 52 min    Activity Tolerance Patient tolerated treatment well    Behavior During Therapy [facility] for tasks assessed/performed                 Past Medical History:  Diagnosis Date   Breast cancer (HCC)    Family history of ovarian cancer 09/02/2017   Family history of prostate cancer in father 09/02/2017   Family history of uterine cancer 09/02/2017   Plantar fasciitis    Past Surgical History:  Procedure Laterality Date   PORT-A-CATH REMOVAL Left 2020   right lumpectomy, sentinel node biopsy, and bilateral mammoplasty  10/2017   [facility]   Patient Active Problem List   Diagnosis Date Noted   Overweight (BMI 25.0-29.9) 12/31/2020   Hyperlipidemia 12/30/2020   Neuropathy due to chemotherapeutic drug (HCC) 12/31/2017   Port-A-Cath in place 11/12/2017   Genetic testing 09/15/2017   Malignant neoplasm of upper-inner quadrant of right breast in female, estrogen receptor positive (HCC) 08/27/2017   Vitamin D deficiency 12/09/2015   Medication management 12/09/2015   Dense breast tissue 12/09/2015    PCP: Nadyne Coombes, MD  REFERRING PROVIDER: Sula Soda, MD  REFERRING DIAG:  Diagnosis  L90.5 (ICD-10-CM) - Scar tissue    THERAPY DIAG:  Aftercare following surgery for neoplasm  Disorder of the skin and subcutaneous tissue related to radiation, unspecified  Stiffness of right shoulder, not elsewhere classified  ONSET DATE: 2019  Rationale for Evaluation and Treatment: Rehabilitation  SUBJECTIVE:                                                                                                                                                                                            SUBJECTIVE STATEMENT:    Did pretty well after last visit. I still have some pain. Trying to remember to do Diaphragmatic breathing.  I got a haircut today and she rubbed my neck and shoulders and now I am hurting in my chest area. It might have been the driving I did also. My left side is feeling beter. The right inferior breast tends to get sore still. It hurt when she massaged my scar last week.  EVAL: tightness on  bilateral incisions Rt>Lt which has been worse the past 6 months.  I also feel tight when I reach with my Rt hand.    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 1 negative node removed. Taxol stopped due to neuropathy. Radiation completed.   PAIN:  PAIN:  Are you having pain? Yes:  PAIN:  Are you having pain? Yes NPRS scale: 3-4/10 Pain location: Right shoulder, UT's Pain orientation: Right and Left  PAIN TYPE: tight Pain description: constant  Aggravating factors: driving, using arms alot Relieving factors: CBD,good posture   PRECAUTIONS: Rt lymphedema risk   RED FLAGS: None   WEIGHT BEARING RESTRICTIONS: No  FALLS:  Has patient fallen in last 6 months? Yes. Number of falls due to CIPN, x 3, bad with uneven ground   LIVING ENVIRONMENT: Lives with: lives with their family and lives with their spouse  OCCUPATION: works with hearing impaired for [employer]   LEISURE: walking dog 0.6 miles per day   HAND DOMINANCE: left   PRIOR LEVEL OF FUNCTION: Independent  PATIENT GOALS: Try and decrease pain   OBJECTIVE:  COGNITION: Overall cognitive status: Within functional limits for tasks assessed   PALPATION: Fat necrosis medial Rt breast, increased scar nodularity medial breasts bil  OBSERVATIONS / OTHER ASSESSMENTS: Lt breast larger than Rt due to radiation on the Rt.     POSTURE: rounded shoulders, forward head  UPPER EXTREMITY AROM/PROM:  A/PROM RIGHT   eval   Shoulder extension 52  Shoulder flexion 165  Shoulder abduction 162  Shoulder internal rotation   Shoulder external rotation 89 - slight pec tightness* / to 80 in supine with shoulder to 90deg of abduction    (Blank rows = not tested)  A/PROM LEFT   eval  Shoulder extension 40  Shoulder flexion 165  Shoulder abduction 165  Shoulder internal rotation   Shoulder external rotation 94 / to 90deg in supine with shoulder 90deg of abduction    (Blank rows = not tested)  LYMPHEDEMA ASSESSMENTS:   LANDMARK RIGHT  eval  At axilla    15 cm proximal to olecranon process   10 cm proximal to olecranon process 27.6  Olecranon process 25.2  15 cm proximal to ulnar styloid process   10 cm proximal to ulnar styloid process 21.5  Just proximal to ulnar styloid process 16.5  Across hand at thumb web space 19  At base of 2nd digit 6.0  (Blank rows = not tested)  LANDMARK LEFT  eval  At axilla    15 cm proximal to olecranon process   10 cm proximal to olecranon process 28  Olecranon process 26.0  15 cm proximal to ulnar styloid process   10 cm proximal to ulnar styloid process 21.7  Just proximal to ulnar styloid process 16.6  Across hand at thumb web space 18.5  At base of 2nd digit 6.2  (Blank rows = not tested)  QUICK DASH SURVEY: 28%   TODAY'S TREATMENT:  DATE:   08/03/2023 Pulleys warm up x flexion and abduction  1/2 foam roller: snow angel x 12, alternating flexion x 10 bil  Yellow scap series  x 10 each  Protraction/Retraction 2# bil x 10 + 5 STM left UT, pectorals, scapular region with cocoa butter, Repeated on right as on left Scar work right breast along border of incision and axillary incision     07/29/2023 Pulleys warm up x  flexion and abduction  1/2 foam roller: snow angel x 10, alternating flexion x 10 bil  Yellow scap series  x 10 each  Protraction/Retraction 2# bil x 10 STM left UT, pectorals, scapular region with cocoa butter, Repeated on right as on left Scar work right breast along border of incision  07/22/2023 1/2 foam roller: snow angel x 10, alternating flexion x 10 bil , alternating horizontal abd x 10 (easy)  Yellow scap series y  on foam roll x 10 each with cueing for reminder of motion for each - pt used good speed today 1/2 foam roller: snow angel x 10, alternating flexion x 10 bil , alternating horizontal abd x 10 (easy)  Yellow scap series on foam roll  x 10 each with cueing for reminder of motion for each - vcs to slow down STM left UT, pectorals, scapular region with cocoa butter, Repeated on right as on left Scar work right breast along border of icision, avoiding area of skin irritation. Discussed second to [facility] for bras however per pt. they don't make them large enough. (Size G)  07/20/23 1/2 foam roller: snow angel x 10, alternating flexion x 10 bil , alternating horizontal abd x 10 (easy)  Yellow scap series yellow x 10 each with cueing for reminder of motion for each - vcs to slow down Supine with arm overhead on pillow: STM Rt pectoralis with cocoa butter Scar work: sustained pressure of 2" up / down/ Rt/ Lt along border of incision on Rt breast and cross motion and cross hands pressure release with focus on middle incision where necrosis is.   07/16/23 Pulleys x flexion and abduction 1/2 foam roller: snow angel x 10, alternating flexion x 10 bil , protraction/retraction x 10 Open book bil 5" x 5 with 1/2 FR under knee to keep good positioning  Yellow scap series yellow x 5 each  Supine with arm overhead on pillow: STM Rt pectoralis with cocoa butter Scar work: sustained pressure of 2" up / down/ Rt/ Lt along border of incision on Rt breast PROM to the Rt shoulder to  tolerance   07/08/23 Pulleys x flexion and abduction 1/2 foam roller: snow angel x 5, alternating flexion x 3 bil , protraction/retraction x 5, abduction stretch bil with Rt arm around 80deg and left around 55 deg Open book bil 5" x 5 with 1/2 FR under knee to keep good positioning  Supine with arm overhead on pillow: STM Rt pectoralis with cocoa butter Scar work: sustained pressure of 2" up / down/ Rt/ Lt along border of incision on Rt breast Then more kneading massage along the inferior edge Opposing force pull across breast PROM to the Rt shoulder to tolerance   07/08/23 Pulleys x flexion and abduction 1/2 foam roller: snow angel x 5, alternating flexion x 3 bil , protraction/retraction x 5,  Open book bil 5" x 5  Supine with arm overhead on pillow: STM Rt pectoralis with cocoa butter Scar work: sustained pressure of 2" up / down/ Rt/  Lt along border of incision on Rt breast Then more kneading massage along the inferior edge Opposing force pull across breast PROM to the Rt shoulder to tolerance   PATIENT EDUCATION:  Education details: POC, new stretches Person educated: Patient Education method: Programmer, multimedia, Demonstration, and Handouts Education comprehension: verbalized understanding  HOME EXERCISE PROGRAM: [id number]   ASSESSMENT: CLINICAL IMPRESSION: Pt required VC's to control end range of motion during supine scapular series exercises for all exs and not to arch back with flexion and sword exs. Pt continues to be tight in UT's and mildly at scapular region. She felt much looser at completion of therapy.  OBJECTIVE IMPAIRMENTS: decreased knowledge of condition, decreased ROM, increased edema, and increased fascial restrictions.   ACTIVITY LIMITATIONS: none  PARTICIPATION LIMITATIONS: none  PERSONAL FACTORS: Time since onset of injury/illness/exacerbation and 1 comorbidity: SLNB, radiation  are also affecting patient's functional outcome.   REHAB POTENTIAL:  Good  CLINICAL DECISION MAKING: Stable/uncomplicated  EVALUATION COMPLEXITY: Moderate  GOALS: Goals reviewed with patient? Yes  SHORT TERM GOALS=LTGs: Target date: 08/09/23  Pt will report reaching in the car is improved by at least 50%  Baseline: Goal status: INITIAL  2.  Pt will be ind with self massage for the breast incisions Baseline:  Goal status: INITIAL  3.  Pt will be given information for second to nature for bras as needed Baseline:  Goal status: INITIAL  4.  Pt will improve Rt shoulder AROM to 90deg to show improved mobility.  Baseline:  Goal status: INITIAL   PLAN:  PT FREQUENCY: 1-2x/week  PT DURATION: 6 weeks  PLANNED INTERVENTIONS: Therapeutic exercises, Therapeutic activity, Patient/Family education, Self Care, Joint mobilization, Manual therapy, and Re-evaluation, DN  PLAN FOR NEXT SESSION: foam roll, Rt shoulder MFR: pec, lat, PROM, stretching as needed, scar massage education, cupping, taping, DN as needed,   Waynette Buttery, PT 08/03/2023, 3:58 PM

## 2023-08-05 ENCOUNTER — Encounter: Payer: Self-pay | Admitting: Rehabilitation

## 2023-08-05 ENCOUNTER — Ambulatory Visit: Payer: BC Managed Care – PPO | Admitting: Rehabilitation

## 2023-08-05 DIAGNOSIS — Z483 Aftercare following surgery for neoplasm: Secondary | ICD-10-CM

## 2023-08-05 DIAGNOSIS — M25611 Stiffness of right shoulder, not elsewhere classified: Secondary | ICD-10-CM

## 2023-08-05 DIAGNOSIS — L599 Disorder of the skin and subcutaneous tissue related to radiation, unspecified: Secondary | ICD-10-CM

## 2023-08-05 NOTE — Therapy (Signed)
OUTPATIENT PHYSICAL THERAPY  UPPER EXTREMITY ONCOLOGY Treatment  Patient Name: Hannah Woodard MRN: [id number] DOB:March 15, 1971, 52 y.o., female Today's Date: 08/05/2023  END OF SESSION:  PT End of Session - 08/05/23 1617     Visit Number 10    Number of Visits 13    Date for PT Re-Evaluation 08/09/23    PT Start Time 1303    PT Stop Time 1356    PT Time Calculation (min) 53 min    Activity Tolerance Patient tolerated treatment well    Behavior During Therapy [facility] for tasks assessed/performed                  Past Medical History:  Diagnosis Date   Breast cancer (HCC)    Family history of ovarian cancer 09/02/2017   Family history of prostate cancer in father 09/02/2017   Family history of uterine cancer 09/02/2017   Plantar fasciitis    Past Surgical History:  Procedure Laterality Date   PORT-A-CATH REMOVAL Left 2020   right lumpectomy, sentinel node biopsy, and bilateral mammoplasty  10/2017   [facility]   Patient Active Problem List   Diagnosis Date Noted   Overweight (BMI 25.0-29.9) 12/31/2020   Hyperlipidemia 12/30/2020   Neuropathy due to chemotherapeutic drug (HCC) 12/31/2017   Port-A-Cath in place 11/12/2017   Genetic testing 09/15/2017   Malignant neoplasm of upper-inner quadrant of right breast in female, estrogen receptor positive (HCC) 08/27/2017   Vitamin D deficiency 12/09/2015   Medication management 12/09/2015   Dense breast tissue 12/09/2015    PCP: Nadyne Coombes, MD  REFERRING PROVIDER: Sula Soda, MD  REFERRING DIAG:  Diagnosis  L90.5 (ICD-10-CM) - Scar tissue    THERAPY DIAG:  Aftercare following surgery for neoplasm  Disorder of the skin and subcutaneous tissue related to radiation, unspecified  Stiffness of right shoulder, not elsewhere classified  ONSET DATE: 2019  Rationale for Evaluation and Treatment: Rehabilitation  SUBJECTIVE:                                                                                                                                                                                            SUBJECTIVE STATEMENT:   I had some pain driving here in the Rt breast.   EVAL: tightness on bilateral incisions Rt>Lt which has been worse the past 6 months.  I also feel tight when I reach with my Rt hand.    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 1 negative node removed. Taxol stopped due to neuropathy. Radiation completed.   PAIN:  Are you having pain? Yes NPRS scale: 3-4/10 Pain location: Right shoulder, UT's Pain orientation: Right and Left  PAIN TYPE: tight Pain description: constant  Aggravating factors: driving, using arms alot Relieving factors: CBD,good posture  PRECAUTIONS: Rt lymphedema risk   RED FLAGS: None   WEIGHT BEARING RESTRICTIONS: No  FALLS:  Has patient fallen in last 6 months? Yes. Number of falls due to CIPN, x 3, bad with uneven ground   LIVING ENVIRONMENT: Lives with: lives with their family and lives with their spouse  OCCUPATION: works with hearing impaired for [employer]   LEISURE: walking dog 0.6 miles per day   HAND DOMINANCE: left   PRIOR LEVEL OF FUNCTION: Independent  PATIENT GOALS: Try and decrease pain   OBJECTIVE:  COGNITION: Overall cognitive status: Within functional limits for tasks assessed   PALPATION: Fat necrosis medial Rt breast, increased scar nodularity medial breasts bil  OBSERVATIONS / OTHER ASSESSMENTS: Lt breast larger than Rt due to radiation on the Rt.    POSTURE: rounded shoulders, forward head  UPPER EXTREMITY AROM/PROM:  A/PROM RIGHT   eval   Shoulder extension 52  Shoulder flexion 165  Shoulder abduction 162  Shoulder internal rotation   Shoulder external rotation 89 - slight pec tightness* / to 80 in supine with shoulder to 90deg of abduction    (Blank rows = not tested)  A/PROM LEFT   eval  Shoulder  extension 40  Shoulder flexion 165  Shoulder abduction 165  Shoulder internal rotation   Shoulder external rotation 94 / to 90deg in supine with shoulder 90deg of abduction    (Blank rows = not tested)  LYMPHEDEMA ASSESSMENTS:   LANDMARK RIGHT  eval  At axilla    15 cm proximal to olecranon process   10 cm proximal to olecranon process 27.6  Olecranon process 25.2  15 cm proximal to ulnar styloid process   10 cm proximal to ulnar styloid process 21.5  Just proximal to ulnar styloid process 16.5  Across hand at thumb web space 19  At base of 2nd digit 6.0  (Blank rows = not tested)  LANDMARK LEFT  eval  At axilla    15 cm proximal to olecranon process   10 cm proximal to olecranon process 28  Olecranon process 26.0  15 cm proximal to ulnar styloid process   10 cm proximal to ulnar styloid process 21.7  Just proximal to ulnar styloid process 16.6  Across hand at thumb web space 18.5  At base of 2nd digit 6.2  (Blank rows = not tested)  QUICK DASH SURVEY: 28%   TODAY'S TREATMENT:                                                                                                                                          DATE:  08/03/2023 Supine snow angel x 12, alternating flexion x 10 bil  red  scap series  x 10 each  Protraction/Retraction 3# bil x 15 Bil tricep extension 3# x 8 - stopping due to being tired  STM left UT, pectorals, anterior shoulder with pain here - noted to be with pectoralis activation vs biceps Scar work right breast along border of incision and axillary incision  07/29/2023 Pulleys warm up x flexion and abduction  1/2 foam roller: snow angel x 10, alternating flexion x 10 bil  Yellow scap series  x 10 each  Protraction/Retraction 2# bil x 10 STM left UT, pectorals, scapular region with cocoa butter, Repeated on right as on left Scar work right breast along border of incision  07/22/2023 1/2 foam roller: snow angel x 10, alternating flexion x  10 bil , alternating horizontal abd x 10 (easy)  Yellow scap series y  on foam roll x 10 each with cueing for reminder of motion for each - pt used good speed today 1/2 foam roller: snow angel x 10, alternating flexion x 10 bil , alternating horizontal abd x 10 (easy)  Yellow scap series on foam roll  x 10 each with cueing for reminder of motion for each - vcs to slow down STM left UT, pectorals, scapular region with cocoa butter, Repeated on right as on left Scar work right breast along border of icision, avoiding area of skin irritation. Discussed second to [facility] for bras however per pt. they don't make them large enough. (Size G)  PATIENT EDUCATION:  Education details: POC, new stretches Person educated: Patient Education method: Explanation, Demonstration, and Handouts Education comprehension: verbalized understanding  HOME EXERCISE PROGRAM: [id number]   ASSESSMENT: CLINICAL IMPRESSION: Discussed POC briefly at end of session.  Pt will think about if she wants to do more vs DC at next visit.  Talked about how we could teach her to do self cupping, etc.    OBJECTIVE IMPAIRMENTS: decreased knowledge of condition, decreased ROM, increased edema, and increased fascial restrictions.   ACTIVITY LIMITATIONS: none  PARTICIPATION LIMITATIONS: none  PERSONAL FACTORS: Time since onset of injury/illness/exacerbation and 1 comorbidity: SLNB, radiation  are also affecting patient's functional outcome.   REHAB POTENTIAL: Good  CLINICAL DECISION MAKING: Stable/uncomplicated  EVALUATION COMPLEXITY: Moderate  GOALS: Goals reviewed with patient? Yes  SHORT TERM GOALS=LTGs: Target date: 08/09/23  Pt will report reaching in the car is improved by at least 50%  Baseline: Goal status: INITIAL  2.  Pt will be ind with self massage for the breast incisions Baseline:  Goal status: INITIAL  3.  Pt will be given information for [employer] to [employer] for bras as needed Baseline:  Goal status:  INITIAL  4.  Pt will improve Rt shoulder AROM to 90deg to show improved mobility.  Baseline:  Goal status: INITIAL   PLAN:  PT FREQUENCY: 1-2x/week  PT DURATION: 6 weeks  PLANNED INTERVENTIONS: Therapeutic exercises, Therapeutic activity, Patient/Family education, Self Care, Joint mobilization, Manual therapy, and Re-evaluation, DN  PLAN FOR NEXT SESSION: foam roll, Rt shoulder MFR: pec, lat, PROM, stretching as needed, scar massage education, cupping, taping, DN as needed,   Idamae Lusher, PT 08/05/2023, 4:18 PM

## 2023-08-09 ENCOUNTER — Other Ambulatory Visit: Payer: Self-pay

## 2023-08-09 ENCOUNTER — Ambulatory Visit: Payer: BC Managed Care – PPO | Admitting: Rehabilitation

## 2023-08-09 ENCOUNTER — Encounter: Payer: Self-pay | Admitting: Rehabilitation

## 2023-08-09 DIAGNOSIS — M25611 Stiffness of right shoulder, not elsewhere classified: Secondary | ICD-10-CM

## 2023-08-09 DIAGNOSIS — L599 Disorder of the skin and subcutaneous tissue related to radiation, unspecified: Secondary | ICD-10-CM

## 2023-08-09 DIAGNOSIS — Z483 Aftercare following surgery for neoplasm: Secondary | ICD-10-CM

## 2023-08-09 MED ORDER — GABAPENTIN 100 MG PO CAPS: 200.0000 mg | ORAL_CAPSULE | Freq: Every day | ORAL | 3 refills | Status: DC

## 2023-08-09 NOTE — Therapy (Signed)
OUTPATIENT PHYSICAL THERAPY  UPPER EXTREMITY ONCOLOGY Treatment  Patient Name: Hannah Woodard MRN: [id number] DOB:[birth year], 52 y.o., female Today's Date: 08/09/2023  END OF SESSION:  PT End of Session - 08/09/23 1000     Visit Number 11    Number of Visits 19    Date for PT Re-Evaluation 09/20/23    PT Start Time 0803    PT Stop Time 0858    PT Time Calculation (min) 55 min    Activity Tolerance Patient tolerated treatment well    Behavior During Therapy [facility] for tasks assessed/performed                  Past Medical History:  Diagnosis Date   Breast cancer (HCC)    Family history of ovarian cancer 09/02/2017   Family history of prostate cancer in father 09/02/2017   Family history of uterine cancer 09/02/2017   Plantar fasciitis    Past Surgical History:  Procedure Laterality Date   PORT-A-CATH REMOVAL Left 2020   right lumpectomy, sentinel node biopsy, and bilateral mammoplasty  10/2017   [facility]   Patient Active Problem List   Diagnosis Date Noted   Overweight (BMI 25.0-29.9) 12/31/2020   Hyperlipidemia 12/30/2020   Neuropathy due to chemotherapeutic drug (HCC) 12/31/2017   Port-A-Cath in place 11/12/2017   Genetic testing 09/15/2017   Malignant neoplasm of upper-inner quadrant of right breast in female, estrogen receptor positive (HCC) 08/27/2017   Vitamin D deficiency 12/09/2015   Medication management 12/09/2015   Dense breast tissue 12/09/2015    PCP: Nadyne Coombes, MD  REFERRING PROVIDER: Sula Soda, MD  REFERRING DIAG:  Diagnosis  L90.5 (ICD-10-CM) - Scar tissue    THERAPY DIAG:  Aftercare following surgery for neoplasm  Disorder of the skin and subcutaneous tissue related to radiation, unspecified  Stiffness of right shoulder, not elsewhere classified  ONSET DATE: 2019  Rationale for Evaluation and Treatment: Rehabilitation  SUBJECTIVE:                                                                                                                                                                                            SUBJECTIVE STATEMENT:  I tried to wear an underwire bra and it was uncomfortable.  I also went line dancing and it got sore.    EVAL: tightness on bilateral incisions Rt>Lt which has been worse the past 6 months.  I also feel tight when I reach with my Rt hand.    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 1 negative node removed.  Taxol stopped due to neuropathy. Radiation completed.   PAIN:  Are you having pain? Yes NPRS scale: 3-4/10 Pain location: Right shoulder, UT's Pain orientation: Right and Left  PAIN TYPE: tight Pain description: constant  Aggravating factors: driving, using arms alot Relieving factors: CBD,good posture  PRECAUTIONS: Rt lymphedema risk   RED FLAGS: None   WEIGHT BEARING RESTRICTIONS: No  FALLS:  Has patient fallen in last 6 months? Yes. Number of falls due to CIPN, x 3, bad with uneven ground   LIVING ENVIRONMENT: Lives with: lives with their family and lives with their spouse  OCCUPATION: works with hearing impaired for [employer]   LEISURE: walking dog 0.6 miles per day   HAND DOMINANCE: left   PRIOR LEVEL OF FUNCTION: Independent  PATIENT GOALS: Try and decrease pain   OBJECTIVE:  COGNITION: Overall cognitive status: Within functional limits for tasks assessed   PALPATION: Fat necrosis medial Rt breast, increased scar nodularity medial breasts bil  OBSERVATIONS / OTHER ASSESSMENTS: Lt breast larger than Rt due to radiation on the Rt.    POSTURE: rounded shoulders, forward head  UPPER EXTREMITY AROM/PROM:  A/PROM RIGHT   eval  08/09/23  Shoulder extension 52 50  Shoulder flexion 165 162  Shoulder abduction 162 160  Shoulder internal rotation    Shoulder external rotation 89 - slight pec tightness* / to 80 in supine with shoulder to 90deg of  abduction 92    (Blank rows = not tested)  A/PROM LEFT   eval 08/09/23  Shoulder extension 40 40  Shoulder flexion 165 165  Shoulder abduction 165 165  Shoulder internal rotation    Shoulder external rotation 94 / to 90deg in supine with shoulder 90deg of abduction 94    (Blank rows = not tested)  LYMPHEDEMA ASSESSMENTS:   LANDMARK RIGHT  eval  At axilla    15 cm proximal to olecranon process   10 cm proximal to olecranon process 27.6  Olecranon process 25.2  15 cm proximal to ulnar styloid process   10 cm proximal to ulnar styloid process 21.5  Just proximal to ulnar styloid process 16.5  Across hand at thumb web space 19  At base of 2nd digit 6.0  (Blank rows = not tested)  LANDMARK LEFT  eval  At axilla    15 cm proximal to olecranon process   10 cm proximal to olecranon process 28  Olecranon process 26.0  15 cm proximal to ulnar styloid process   10 cm proximal to ulnar styloid process 21.7  Just proximal to ulnar styloid process 16.6  Across hand at thumb web space 18.5  At base of 2nd digit 6.2  (Blank rows = not tested)  QUICK DASH SURVEY: 28% on eval, 36% limited (pt started square dancing and noticed some limitations)   BREAST COMPLAINTS QUESTIONNAIRE Pain:  3 Heaviness: 4 Swollen feeling:0 Tense Skin: 0 Redness: 0 Bra Print: 0 Size of Pores: 0 Hard feeling:  3 Total:    10 /80 A Score over 9 indicates lymphedema issues in the breast   TODAY'S TREATMENT:  DATE:  08/09/23 Reassessed goals and status  Demonstrated cupping on my own forearm using cocoa butter and small sillicone cup with demonstration on pressure, movement, etc. And showed pt [employer] silicone facial cups. Also showed pt cancer rehab youtube video on cupping for scar tissue.   STM left anterior shoulder with no pain here today  Scar work right breast  along border of incision and axillary incision  08/03/2023 Supine snow angel x 12, alternating flexion x 10 bil  red scap series  x 10 each  Protraction/Retraction 3# bil x 15 Bil tricep extension 3# x 8 - stopping due to being tired  STM left UT, pectorals, anterior shoulder with pain here - noted to be with pectoralis activation vs biceps Scar work right breast along border of incision and axillary incision  07/29/2023 Pulleys warm up x flexion and abduction  1/2 foam roller: snow angel x 10, alternating flexion x 10 bil  Yellow scap series  x 10 each  Protraction/Retraction 2# bil x 10 STM left UT, pectorals, scapular region with cocoa butter, Repeated on right as on left Scar work right breast along border of incision  07/22/2023 1/2 foam roller: snow angel x 10, alternating flexion x 10 bil , alternating horizontal abd x 10 (easy)  Yellow scap series y  on foam roll x 10 each with cueing for reminder of motion for each - pt used good speed today 1/2 foam roller: snow angel x 10, alternating flexion x 10 bil , alternating horizontal abd x 10 (easy)  Yellow scap series on foam roll  x 10 each with cueing for reminder of motion for each - vcs to slow down STM left UT, pectorals, scapular region with cocoa butter, Repeated on right as on left Scar work right breast along border of icision, avoiding area of skin irritation. Discussed second to [facility] for bras however per pt. they don't make them large enough. (Size G)  PATIENT EDUCATION:  Education details: POC, new stretches Person educated: Patient Education method: Explanation, Demonstration, and Handouts Education comprehension: verbalized understanding  HOME EXERCISE PROGRAM: [id number]   ASSESSMENT: CLINICAL IMPRESSION: Pt has not shown improvements in her QDASH at this point and introducted the breast complaints questionnaire which is right on the cutoff of lymphedema issues at 10/80.  She reports improvements with  coming to therapy and is still working towards meeting her goal of ind with self massage and ind with self care.  She has a handout on self scar massage and was given an introduction on self cupping today.  Extended POC x 4 weeks.    OBJECTIVE IMPAIRMENTS: decreased knowledge of condition, decreased ROM, increased edema, and increased fascial restrictions.   ACTIVITY LIMITATIONS: none  PARTICIPATION LIMITATIONS: none  PERSONAL FACTORS: Time since onset of injury/illness/exacerbation and 1 comorbidity: SLNB, radiation  are also affecting patient's functional outcome.   REHAB POTENTIAL: Good  CLINICAL DECISION MAKING: Stable/uncomplicated  EVALUATION COMPLEXITY: Moderate  GOALS: Goals reviewed with patient? Yes  SHORT TERM GOALS=LTGs: Target date: 09/20/23  Pt will report reaching in the car is improved by at least 50%  Baseline: Goal status: MET  2.  Pt will be ind with self massage for the breast incisions Baseline:  Goal status: ONGOING  3.  Pt will be given information for second to nature for bras as needed Baseline:  Goal status: MET   4.  Pt will improve Rt shoulder AROM to 90deg to show improved mobility.  Baseline:  Goal status: MET  5 Pt will be ind with stretches and exercises for continued self care.   NEW   PLAN:  PT FREQUENCY: 1-2x/week  PT DURATION: 4 weeks - 6 weeks added for timing.   PLANNED INTERVENTIONS: Therapeutic exercises, Therapeutic activity, Patient/Family education, Self Care, Joint mobilization, Manual therapy, and Re-evaluation, DN  PLAN FOR NEXT SESSION: foam roll, Rt shoulder MFR: pec, lat, PROM, stretching as needed, scar massage education, cupping, taping, DN as needed,   Idamae Lusher, PT 08/09/2023, 8:56 PM

## 2023-08-18 ENCOUNTER — Ambulatory Visit: Payer: BC Managed Care – PPO

## 2023-08-18 DIAGNOSIS — Z483 Aftercare following surgery for neoplasm: Secondary | ICD-10-CM | POA: Diagnosis not present

## 2023-08-18 DIAGNOSIS — L599 Disorder of the skin and subcutaneous tissue related to radiation, unspecified: Secondary | ICD-10-CM

## 2023-08-18 DIAGNOSIS — M25611 Stiffness of right shoulder, not elsewhere classified: Secondary | ICD-10-CM

## 2023-08-18 NOTE — Therapy (Signed)
OUTPATIENT PHYSICAL THERAPY  UPPER EXTREMITY ONCOLOGY Treatment  Patient Name: Hannah Woodard MRN: [id number] DOB:May 30, 1971, 52 y.o., female Today's Date: 08/18/2023  END OF SESSION:  PT End of Session - 08/18/23 1105     Visit Number 12    Number of Visits 19    Date for PT Re-Evaluation 09/20/23    PT Start Time 1106    PT Stop Time 1153    PT Time Calculation (min) 47 min    Activity Tolerance Patient tolerated treatment well    Behavior During Therapy [facility] for tasks assessed/performed                  Past Medical History:  Diagnosis Date   Breast cancer (HCC)    Family history of ovarian cancer 09/02/2017   Family history of prostate cancer in father 09/02/2017   Family history of uterine cancer 09/02/2017   Plantar fasciitis    Past Surgical History:  Procedure Laterality Date   PORT-A-CATH REMOVAL Left 2020   right lumpectomy, sentinel node biopsy, and bilateral mammoplasty  10/2017   [facility]   Patient Active Problem List   Diagnosis Date Noted   Overweight (BMI 25.0-29.9) 12/31/2020   Hyperlipidemia 12/30/2020   Neuropathy due to chemotherapeutic drug (HCC) 12/31/2017   Port-A-Cath in place 11/12/2017   Genetic testing 09/15/2017   Malignant neoplasm of upper-inner quadrant of right breast in female, estrogen receptor positive (HCC) 08/27/2017   Vitamin D deficiency 12/09/2015   Medication management 12/09/2015   Dense breast tissue 12/09/2015    PCP: Nadyne Coombes, MD  REFERRING PROVIDER: Sula Soda, MD  REFERRING DIAG:  Diagnosis  L90.5 (ICD-10-CM) - Scar tissue    THERAPY DIAG:  Aftercare following surgery for neoplasm  Disorder of the skin and subcutaneous tissue related to radiation, unspecified  Stiffness of right shoulder, not elsewhere classified  ONSET DATE: 2019  Rationale for Evaluation and Treatment: Rehabilitation  SUBJECTIVE:                                                                                                                                                                                            SUBJECTIVE STATEMENT:  I threw my upper back out Monday and saw the chiropractor which helped and I used my TENS unit. My LB  was hurting at the end of the school day yesterday, but right now my Upper back hurts. It can be a sharp pain just right of my spine. I see the chiropractor today.  EVAL: tightness on bilateral incisions Rt>Lt which has been worse the past 6 months.  I also feel tight when I reach  with my Rt hand.    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 1 negative node removed. Taxol stopped due to neuropathy. Radiation completed.   PAIN:  Are you having pain? Yes NPRS scale: 5-6/10 Pain location: upper back Pain orientation: Right and Left  PAIN TYPE: tight Pain description: constant  Aggravating factors: driving, using arms a lot, slouching Relieving factors: CBD,good posture, anti inflammatory  PRECAUTIONS: Rt lymphedema risk   RED FLAGS: None   WEIGHT BEARING RESTRICTIONS: No  FALLS:  Has patient fallen in last 6 months? Yes. Number of falls due to CIPN, x 3, bad with uneven ground   LIVING ENVIRONMENT: Lives with: lives with their family and lives with their spouse  OCCUPATION: works with hearing impaired for [employer]   LEISURE: walking dog 0.6 miles per day   HAND DOMINANCE: left   PRIOR LEVEL OF FUNCTION: Independent  PATIENT GOALS: Try and decrease pain   OBJECTIVE:  COGNITION: Overall cognitive status: Within functional limits for tasks assessed   PALPATION: Fat necrosis medial Rt breast, increased scar nodularity medial breasts bil  OBSERVATIONS / OTHER ASSESSMENTS: Lt breast larger than Rt due to radiation on the Rt.    POSTURE: rounded shoulders, forward head  UPPER EXTREMITY AROM/PROM:  A/PROM RIGHT   eval  08/09/23  Shoulder extension 52 50   Shoulder flexion 165 162  Shoulder abduction 162 160  Shoulder internal rotation    Shoulder external rotation 89 - slight pec tightness* / to 80 in supine with shoulder to 90deg of abduction 92    (Blank rows = not tested)  A/PROM LEFT   eval 08/09/23  Shoulder extension 40 40  Shoulder flexion 165 165  Shoulder abduction 165 165  Shoulder internal rotation    Shoulder external rotation 94 / to 90deg in supine with shoulder 90deg of abduction 94    (Blank rows = not tested)  LYMPHEDEMA ASSESSMENTS:   LANDMARK RIGHT  eval  At axilla    15 cm proximal to olecranon process   10 cm proximal to olecranon process 27.6  Olecranon process 25.2  15 cm proximal to ulnar styloid process   10 cm proximal to ulnar styloid process 21.5  Just proximal to ulnar styloid process 16.5  Across hand at thumb web space 19  At base of 2nd digit 6.0  (Blank rows = not tested)  LANDMARK LEFT  eval  At axilla    15 cm proximal to olecranon process   10 cm proximal to olecranon process 28  Olecranon process 26.0  15 cm proximal to ulnar styloid process   10 cm proximal to ulnar styloid process 21.7  Just proximal to ulnar styloid process 16.6  Across hand at thumb web space 18.5  At base of 2nd digit 6.2  (Blank rows = not tested)  QUICK DASH SURVEY: 28% on eval, 36% limited (pt started square dancing and noticed some limitations)   BREAST COMPLAINTS QUESTIONNAIRE Pain:  3 Heaviness: 4 Swollen feeling:0 Tense Skin: 0 Redness: 0 Bra Print: 0 Size of Pores: 0 Hard feeling:  3 Total:    10 /80 A Score over 9 indicates lymphedema issues in the breast   TODAY'S TREATMENT:  DATE:   08/18/2023  Supine scapular stabs red x 10 ea Half foam roll; Horizontal abduction x 5, alternating flexion x 10, snow angels x10 Pt brought 2 small cups and wanted it  done on her so she could watch. Explained uses, how to squeeze cup for suction, applying cocoa butter, traction, tilt, S, dynamic cupping only. Performed on Right axillary and breast incision while instructing pt. Pt tried axillary incision but had difficulty seeing what she was fdoing and may need to try standing or show her husband.   08/09/23 Reassessed goals and status  Demonstrated cupping on my own forearm using cocoa butter and small sillicone cup with demonstration on pressure, movement, etc. And showed pt [employer] silicone facial cups. Also showed pt cancer rehab youtube video on cupping for scar tissue.   STM left anterior shoulder with no pain here today  Scar work right breast along border of incision and axillary incision  08/03/2023 Supine snow angel x 12, alternating flexion x 10 bil  red scap series  x 10 each  Protraction/Retraction 3# bil x 15 Bil tricep extension 3# x 8 - stopping due to being tired  STM left UT, pectorals, anterior shoulder with pain here - noted to be with pectoralis activation vs biceps Scar work right breast along border of incision and axillary incision  07/29/2023 Pulleys warm up x flexion and abduction  1/2 foam roller: snow angel x 10, alternating flexion x 10 bil  Yellow scap series  x 10 each  Protraction/Retraction 2# bil x 10 STM left UT, pectorals, scapular region with cocoa butter, Repeated on right as on left Scar work right breast along border of incision  07/22/2023 1/2 foam roller: snow angel x 10, alternating flexion x 10 bil , alternating horizontal abd x 10 (easy)  Yellow scap series y  on foam roll x 10 each with cueing for reminder of motion for each - pt used good speed today 1/2 foam roller: snow angel x 10, alternating flexion x 10 bil , alternating horizontal abd x 10 (easy)  Yellow scap series on foam roll  x 10 each with cueing for reminder of motion for each - vcs to slow down STM left UT, pectorals, scapular region  with cocoa butter, Repeated on right as on left Scar work right breast along border of icision, avoiding area of skin irritation. Discussed second to [facility] for bras however per pt. they don't make them large enough. (Size G)  PATIENT EDUCATION:  Education details: POC, new stretches Person educated: Patient Education method: Explanation, Demonstration, and Handouts Education comprehension: verbalized understanding  HOME EXERCISE PROGRAM: [id number]   ASSESSMENT: CLINICAL IMPRESSION:  Pt did well with exercises performed in clinic and her upper back felt much better at completion of therapy. She still had some tailbone pain. Initiated cupping to demonstrate to pt with her own cups, and verbalized what I was doing while performing.   OBJECTIVE IMPAIRMENTS: decreased knowledge of condition, decreased ROM, increased edema, and increased fascial restrictions.   ACTIVITY LIMITATIONS: none  PARTICIPATION LIMITATIONS: none  PERSONAL FACTORS: Time since onset of injury/illness/exacerbation and 1 comorbidity: SLNB, radiation  are also affecting patient's functional outcome.   REHAB POTENTIAL: Good  CLINICAL DECISION MAKING: Stable/uncomplicated  EVALUATION COMPLEXITY: Moderate  GOALS: Goals reviewed with patient? Yes  SHORT TERM GOALS=LTGs: Target date: 09/20/23  Pt will report reaching in the car is improved by at least 50%  Baseline: Goal status: MET  2.  Pt will be ind with  self massage for the breast incisions Baseline:  Goal status: ONGOING  3.  Pt will be given information for second to nature for bras as needed Baseline:  Goal status: MET   4.  Pt will improve Rt shoulder AROM to 90deg to show improved mobility.  Baseline:  Goal status: MET  5 Pt will be ind with stretches and exercises for continued self care.   NEW   PLAN:  PT FREQUENCY: 1-2x/week  PT DURATION: 4 weeks - 6 weeks added for timing.   PLANNED INTERVENTIONS: Therapeutic exercises, Therapeutic  activity, Patient/Family education, Self Care, Joint mobilization, Manual therapy, and Re-evaluation, DN  PLAN FOR NEXT SESSION: foam roll, Rt shoulder MFR: pec, lat, PROM, stretching as needed, scar massage education, cupping, taping, DN as needed,   Waynette Buttery, PT 08/18/2023, 11:55 AM

## 2023-08-24 ENCOUNTER — Encounter: Payer: Self-pay | Admitting: Rehabilitation

## 2023-08-24 ENCOUNTER — Ambulatory Visit: Payer: BC Managed Care – PPO | Admitting: Rehabilitation

## 2023-08-24 DIAGNOSIS — Z483 Aftercare following surgery for neoplasm: Secondary | ICD-10-CM

## 2023-08-24 DIAGNOSIS — L599 Disorder of the skin and subcutaneous tissue related to radiation, unspecified: Secondary | ICD-10-CM

## 2023-08-24 DIAGNOSIS — M25611 Stiffness of right shoulder, not elsewhere classified: Secondary | ICD-10-CM

## 2023-08-24 NOTE — Therapy (Signed)
OUTPATIENT PHYSICAL THERAPY  UPPER EXTREMITY ONCOLOGY Treatment  Patient Name: Hannah Woodard MRN: [id number] DOB:03-31-1971, 52 y.o., female Today's Date: 08/24/2023  END OF SESSION:  PT End of Session - 08/24/23 1459     Visit Number 13    Number of Visits 19    Date for PT Re-Evaluation 09/20/23    PT Start Time 1404    PT Stop Time 1457    PT Time Calculation (min) 53 min    Activity Tolerance Patient tolerated treatment well    Behavior During Therapy [facility] for tasks assessed/performed                   Past Medical History:  Diagnosis Date   Breast cancer (HCC)    Family history of ovarian cancer 09/02/2017   Family history of prostate cancer in father 09/02/2017   Family history of uterine cancer 09/02/2017   Plantar fasciitis    Past Surgical History:  Procedure Laterality Date   PORT-A-CATH REMOVAL Left 2020   right lumpectomy, sentinel node biopsy, and bilateral mammoplasty  10/2017   [facility]   Patient Active Problem List   Diagnosis Date Noted   Overweight (BMI 25.0-29.9) 12/31/2020   Hyperlipidemia 12/30/2020   Neuropathy due to chemotherapeutic drug (HCC) 12/31/2017   Port-A-Cath in place 11/12/2017   Genetic testing 09/15/2017   Malignant neoplasm of upper-inner quadrant of right breast in female, estrogen receptor positive (HCC) 08/27/2017   Vitamin D deficiency 12/09/2015   Medication management 12/09/2015   Dense breast tissue 12/09/2015    PCP: Nadyne Coombes, MD  REFERRING PROVIDER: Sula Soda, MD  REFERRING DIAG:  Diagnosis  L90.5 (ICD-10-CM) - Scar tissue    THERAPY DIAG:  Aftercare following surgery for neoplasm  Disorder of the skin and subcutaneous tissue related to radiation, unspecified  Stiffness of right shoulder, not elsewhere classified  ONSET DATE: 2019  Rationale for Evaluation and Treatment: Rehabilitation  SUBJECTIVE:                                                                                                                                                                                            SUBJECTIVE STATEMENT:  My back is still hurting and my Right shoulder. The chiropractor I see her again tomorrow.  EVAL: tightness on bilateral incisions Rt>Lt which has been worse the past 6 months.  I also feel tight when I reach with my Rt hand.    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 1 negative node removed. Taxol stopped due to neuropathy. Radiation  completed.   PAIN:  Are you having pain? Yes NPRS scale: 4/10 Pain location: upper back Pain orientation: Right and Left  PAIN TYPE: tight Pain description: constant  Aggravating factors: driving, using arms a lot, slouching Relieving factors: CBD,good posture, anti inflammatory  PRECAUTIONS: Rt lymphedema risk   RED FLAGS: None   WEIGHT BEARING RESTRICTIONS: No  FALLS:  Has patient fallen in last 6 months? Yes. Number of falls due to CIPN, x 3, bad with uneven ground   LIVING ENVIRONMENT: Lives with: lives with their family and lives with their spouse  OCCUPATION: works with hearing impaired for [employer]   LEISURE: walking dog 0.6 miles per day   HAND DOMINANCE: left   PRIOR LEVEL OF FUNCTION: Independent  PATIENT GOALS: Try and decrease pain   OBJECTIVE:  COGNITION: Overall cognitive status: Within functional limits for tasks assessed   PALPATION: Fat necrosis medial Rt breast, increased scar nodularity medial breasts bil  OBSERVATIONS / OTHER ASSESSMENTS: Lt breast larger than Rt due to radiation on the Rt.    POSTURE: rounded shoulders, forward head  UPPER EXTREMITY AROM/PROM:  A/PROM RIGHT   eval  08/09/23  Shoulder extension 52 50  Shoulder flexion 165 162  Shoulder abduction 162 160  Shoulder internal rotation    Shoulder external rotation 89 - slight pec tightness* / to 80 in supine with shoulder to 90deg  of abduction 92    (Blank rows = not tested)  A/PROM LEFT   eval 08/09/23  Shoulder extension 40 40  Shoulder flexion 165 165  Shoulder abduction 165 165  Shoulder internal rotation    Shoulder external rotation 94 / to 90deg in supine with shoulder 90deg of abduction 94    (Blank rows = not tested)  LYMPHEDEMA ASSESSMENTS:   LANDMARK RIGHT  eval  At axilla    15 cm proximal to olecranon process   10 cm proximal to olecranon process 27.6  Olecranon process 25.2  15 cm proximal to ulnar styloid process   10 cm proximal to ulnar styloid process 21.5  Just proximal to ulnar styloid process 16.5  Across hand at thumb web space 19  At base of 2nd digit 6.0  (Blank rows = not tested)  LANDMARK LEFT  eval  At axilla    15 cm proximal to olecranon process   10 cm proximal to olecranon process 28  Olecranon process 26.0  15 cm proximal to ulnar styloid process   10 cm proximal to ulnar styloid process 21.7  Just proximal to ulnar styloid process 16.6  Across hand at thumb web space 18.5  At base of 2nd digit 6.2  (Blank rows = not tested)  QUICK DASH SURVEY: 28% on eval, 36% limited (pt started square dancing and noticed some limitations)   BREAST COMPLAINTS QUESTIONNAIRE Pain:  3 Heaviness: 4 Swollen feeling:0 Tense Skin: 0 Redness: 0 Bra Print: 0 Size of Pores: 0 Hard feeling:  3 Total:    10 /80 A Score over 9 indicates lymphedema issues in the breast   TODAY'S TREATMENT:  DATE:  08/24/2023 Pulleys into flexion and abduction x bil Quadruped cat/cow x 5 Childs pose mid/Rt/Lt x 2 each  Half foam roll; chest stretch x 60", Horizontal abduction x 5, alternating flexion x 10, snow angels x10 Supine scapular stabs red x 10 ea Scar work right breast along border of incision and axillary incision - pt's very small cup used x 3  min total using dynamic motions along scar.  No real restriction noted with cup.   08/18/2023 Supine scapular stabs red x 10 ea Half foam roll; Horizontal abduction x 5, alternating flexion x 10, snow angels x10 Pt brought 2 small cups and wanted it done on her so she could watch. Explained uses, how to squeeze cup for suction, applying cocoa butter, traction, tilt, S, dynamic cupping only. Performed on Right axillary and breast incision while instructing pt. Pt tried axillary incision but had difficulty seeing what she was fdoing and may need to try standing or show her husband.  08/09/23 Reassessed goals and status  Demonstrated cupping on my own forearm using cocoa butter and small sillicone cup with demonstration on pressure, movement, etc. And showed pt [employer] silicone facial cups. Also showed pt cancer rehab youtube video on cupping for scar tissue.   STM left anterior shoulder with no pain here today  Scar work right breast along border of incision and axillary incision  08/03/2023 Supine snow angel x 12, alternating flexion x 10 bil  red scap series  x 10 each  Protraction/Retraction 3# bil x 15 Bil tricep extension 3# x 8 - stopping due to being tired  STM left UT, pectorals, anterior shoulder with pain here - noted to be with pectoralis activation vs biceps Scar work right breast along border of incision and axillary incision  07/29/2023 Pulleys warm up x flexion and abduction  1/2 foam roller: snow angel x 10, alternating flexion x 10 bil  Yellow scap series  x 10 each  Protraction/Retraction 2# bil x 10 STM left UT, pectorals, scapular region with cocoa butter, Repeated on right as on left Scar work right breast along border of incision  07/22/2023 1/2 foam roller: snow angel x 10, alternating flexion x 10 bil , alternating horizontal abd x 10 (easy)  Yellow scap series y  on foam roll x 10 each with cueing for reminder of motion for each - pt used good speed today 1/2  foam roller: snow angel x 10, alternating flexion x 10 bil , alternating horizontal abd x 10 (easy)  Yellow scap series on foam roll  x 10 each with cueing for reminder of motion for each - vcs to slow down STM left UT, pectorals, scapular region with cocoa butter, Repeated on right as on left Scar work right breast along border of icision, avoiding area of skin irritation. Discussed second to [facility] for bras however per pt. they don't make them large enough. (Size G)  PATIENT EDUCATION:  Education details: POC, new stretches Person educated: Patient Education method: Explanation, Demonstration, and Handouts Education comprehension: verbalized understanding  HOME EXERCISE PROGRAM: [id number]   ASSESSMENT: CLINICAL IMPRESSION: Pt is feeling better but still having some new mid back pain.  Did not feel it worsening with any TE.    OBJECTIVE IMPAIRMENTS: decreased knowledge of condition, decreased ROM, increased edema, and increased fascial restrictions.   ACTIVITY LIMITATIONS: none  PARTICIPATION LIMITATIONS: none  PERSONAL FACTORS: Time since onset of injury/illness/exacerbation and 1 comorbidity: SLNB, radiation  are also affecting patient's functional outcome.  REHAB POTENTIAL: Good  CLINICAL DECISION MAKING: Stable/uncomplicated  EVALUATION COMPLEXITY: Moderate  GOALS: Goals reviewed with patient? Yes  SHORT TERM GOALS=LTGs: Target date: 09/20/23  Pt will report reaching in the car is improved by at least 50%  Baseline: Goal status: MET  2.  Pt will be ind with self massage for the breast incisions Baseline:  Goal status: ONGOING  3.  Pt will be given information for second to nature for bras as needed Baseline:  Goal status: MET   4.  Pt will improve Rt shoulder AROM to 90deg to show improved mobility.  Baseline:  Goal status: MET  5 Pt will be ind with stretches and exercises for continued self care.   NEW   PLAN:  PT FREQUENCY: 1-2x/week  PT DURATION:  4 weeks - 6 weeks added for timing.   PLANNED INTERVENTIONS: Therapeutic exercises, Therapeutic activity, Patient/Family education, Self Care, Joint mobilization, Manual therapy, and Re-evaluation, DN  PLAN FOR NEXT SESSION: foam roll, Rt shoulder MFR: pec, lat, PROM, stretching as needed, scar massage education, cupping, taping, DN as needed,   Idamae Lusher, PT 08/24/2023, 3:00 PM

## 2023-09-01 ENCOUNTER — Ambulatory Visit: Payer: BC Managed Care – PPO | Attending: Physical Medicine and Rehabilitation

## 2023-09-01 DIAGNOSIS — M25611 Stiffness of right shoulder, not elsewhere classified: Secondary | ICD-10-CM | POA: Diagnosis present

## 2023-09-01 DIAGNOSIS — L599 Disorder of the skin and subcutaneous tissue related to radiation, unspecified: Secondary | ICD-10-CM | POA: Insufficient documentation

## 2023-09-01 DIAGNOSIS — Z483 Aftercare following surgery for neoplasm: Secondary | ICD-10-CM | POA: Diagnosis present

## 2023-09-01 NOTE — Therapy (Signed)
OUTPATIENT PHYSICAL THERAPY  UPPER EXTREMITY ONCOLOGY Treatment  Patient Name: Hannah Woodard MRN: [id number] DOB:July 06, 1971, 52 y.o., female Today's Date: 09/01/2023  END OF SESSION:  PT End of Session - 09/01/23 1500     Visit Number 14    Number of Visits 19    Date for PT Re-Evaluation 09/20/23    PT Start Time 1500    PT Stop Time 1550    PT Time Calculation (min) 50 min    Activity Tolerance Patient tolerated treatment well    Behavior During Therapy [facility] for tasks assessed/performed                   Past Medical History:  Diagnosis Date   Breast cancer (HCC)    Family history of ovarian cancer 09/02/2017   Family history of prostate cancer in father 09/02/2017   Family history of uterine cancer 09/02/2017   Plantar fasciitis    Past Surgical History:  Procedure Laterality Date   PORT-A-CATH REMOVAL Left 2020   right lumpectomy, sentinel node biopsy, and bilateral mammoplasty  10/2017   [facility]   Patient Active Problem List   Diagnosis Date Noted   Overweight (BMI 25.0-29.9) 12/31/2020   Hyperlipidemia 12/30/2020   Neuropathy due to chemotherapeutic drug (HCC) 12/31/2017   Port-A-Cath in place 11/12/2017   Genetic testing 09/15/2017   Malignant neoplasm of upper-inner quadrant of right breast in female, estrogen receptor positive (HCC) 08/27/2017   Vitamin D deficiency 12/09/2015   Medication management 12/09/2015   Dense breast tissue 12/09/2015    PCP: Nadyne Coombes, MD  REFERRING PROVIDER: Sula Soda, MD  REFERRING DIAG:  Diagnosis  L90.5 (ICD-10-CM) - Scar tissue    THERAPY DIAG:  Aftercare following surgery for neoplasm  Disorder of the skin and subcutaneous tissue related to radiation, unspecified  Stiffness of right shoulder, not elsewhere classified  ONSET DATE: 2019  Rationale for Evaluation and Treatment: Rehabilitation  SUBJECTIVE:                                                                                                                                                                                            SUBJECTIVE STATEMENT:  Upper back is still hurting me, but its better than it was. I see the chiropractor after the visit here. I had some bone loss in the femur with my bone density so I am taking 1000 mg of Calcium a day. They advised me to do some wt. Training. I thinkI have a handout at home  EVAL: tightness on bilateral incisions Rt>Lt which has been worse the past 6 months.  I also feel tight  when I reach with my Rt hand.    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 1 negative node removed. Taxol stopped due to neuropathy. Radiation completed.   PAIN:  Are you having pain? Yes NPRS scale: 4/10 upperback, feet 3/10 Pain location: upper back, feet Pain orientation: Right and Left  PAIN TYPE: tight Pain description: constant  Aggravating factors: driving, using arms a lot, slouching Relieving factors: CBD,good posture, anti inflammatory  PRECAUTIONS: Rt lymphedema risk   RED FLAGS: None   WEIGHT BEARING RESTRICTIONS: No  FALLS:  Has patient fallen in last 6 months? Yes. Number of falls due to CIPN, x 3, bad with uneven ground   LIVING ENVIRONMENT: Lives with: lives with their family and lives with their spouse  OCCUPATION: works with hearing impaired for [employer]   LEISURE: walking dog 0.6 miles per day   HAND DOMINANCE: left   PRIOR LEVEL OF FUNCTION: Independent  PATIENT GOALS: Try and decrease pain   OBJECTIVE:  COGNITION: Overall cognitive status: Within functional limits for tasks assessed   PALPATION: Fat necrosis medial Rt breast, increased scar nodularity medial breasts bil  OBSERVATIONS / OTHER ASSESSMENTS: Lt breast larger than Rt due to radiation on the Rt.    POSTURE: rounded shoulders, forward head  UPPER EXTREMITY AROM/PROM:  A/PROM RIGHT   eval  08/09/23   Shoulder extension 52 50  Shoulder flexion 165 162  Shoulder abduction 162 160  Shoulder internal rotation    Shoulder external rotation 89 - slight pec tightness* / to 80 in supine with shoulder to 90deg of abduction 92    (Blank rows = not tested)  A/PROM LEFT   eval 08/09/23  Shoulder extension 40 40  Shoulder flexion 165 165  Shoulder abduction 165 165  Shoulder internal rotation    Shoulder external rotation 94 / to 90deg in supine with shoulder 90deg of abduction 94    (Blank rows = not tested)  LYMPHEDEMA ASSESSMENTS:   LANDMARK RIGHT  eval  At axilla    15 cm proximal to olecranon process   10 cm proximal to olecranon process 27.6  Olecranon process 25.2  15 cm proximal to ulnar styloid process   10 cm proximal to ulnar styloid process 21.5  Just proximal to ulnar styloid process 16.5  Across hand at thumb web space 19  At base of 2nd digit 6.0  (Blank rows = not tested)  LANDMARK LEFT  eval  At axilla    15 cm proximal to olecranon process   10 cm proximal to olecranon process 28  Olecranon process 26.0  15 cm proximal to ulnar styloid process   10 cm proximal to ulnar styloid process 21.7  Just proximal to ulnar styloid process 16.6  Across hand at thumb web space 18.5  At base of 2nd digit 6.2  (Blank rows = not tested)  QUICK DASH SURVEY: 28% on eval, 36% limited (pt started square dancing and noticed some limitations)   BREAST COMPLAINTS QUESTIONNAIRE Pain:  3 Heaviness: 4 Swollen feeling:0 Tense Skin: 0 Redness: 0 Bra Print: 0 Size of Pores: 0 Hard feeling:  3 Total:    10 /80 A Score over 9 indicates lymphedema issues in the breast   TODAY'S TREATMENT:  DATE:   09/01/2023 Pulleys into flexion and abduction x bil Quadruped cat/cow x 6 Childs pose mid/Rt/Lt x 2 each  Half foam roll; chest stretch x  60", Horizontal abduction x 5, alternating flexion x 10, snow angels x10 Supine scapular stabs red x 10 ea Scar work right breast along border of incision and axillary incision - smallest cup used x 5 min total using dynamic motions along scar and popping.  No real restriction noted with cup.  Pt given ABC handout as a resource and shown important first few pages for progression of exercises etc.   08/24/2023 Pulleys into flexion and abduction x bil Quadruped cat/cow x 5 Childs pose mid/Rt/Lt x 2 each  Half foam roll; chest stretch x 60", Horizontal abduction x 5, alternating flexion x 10, snow angels x10 Supine scapular stabs red x 10 ea Scar work right breast along border of incision and axillary incision - pt's very small cup used x 3 min total using dynamic motions along scar.  No real restriction noted with cup.   08/18/2023 Supine scapular stabs red x 10 ea Half foam roll; Horizontal abduction x 5, alternating flexion x 10, snow angels x10 Pt brought 2 small cups and wanted it done on her so she could watch. Explained uses, how to squeeze cup for suction, applying cocoa butter, traction, tilt, S, dynamic cupping only. Performed on Right axillary and breast incision while instructing pt. Pt tried axillary incision but had difficulty seeing what she was fdoing and may need to try standing or show her husband.  08/09/23 Reassessed goals and status  Demonstrated cupping on my own forearm using cocoa butter and small sillicone cup with demonstration on pressure, movement, etc. And showed pt [employer] silicone facial cups. Also showed pt cancer rehab youtube video on cupping for scar tissue.   STM left anterior shoulder with no pain here today  Scar work right breast along border of incision and axillary incision  08/03/2023 Supine snow angel x 12, alternating flexion x 10 bil  red scap series  x 10 each  Protraction/Retraction 3# bil x 15 Bil tricep extension 3# x 8 - stopping due to  being tired  STM left UT, pectorals, anterior shoulder with pain here - noted to be with pectoralis activation vs biceps Scar work right breast along border of incision and axillary incision  07/29/2023 Pulleys warm up x flexion and abduction  1/2 foam roller: snow angel x 10, alternating flexion x 10 bil  Yellow scap series  x 10 each  Protraction/Retraction 2# bil x 10 STM left UT, pectorals, scapular region with cocoa butter, Repeated on right as on left Scar work right breast along border of incision  07/22/2023 1/2 foam roller: snow angel x 10, alternating flexion x 10 bil , alternating horizontal abd x 10 (easy)  Yellow scap series y  on foam roll x 10 each with cueing for reminder of motion for each - pt used good speed today 1/2 foam roller: snow angel x 10, alternating flexion x 10 bil , alternating horizontal abd x 10 (easy)  Yellow scap series on foam roll  x 10 each with cueing for reminder of motion for each - vcs to slow down STM left UT, pectorals, scapular region with cocoa butter, Repeated on right as on left Scar work right breast along border of icision, avoiding area of skin irritation. Discussed second to [facility] for bras however per pt. they don't make them large enough. (Size  G)  PATIENT EDUCATION:  Education details: POC, new stretches Person educated: Patient Education method: Explanation, Demonstration, and Handouts Education comprehension: verbalized understanding  HOME EXERCISE PROGRAM: [id number]   ASSESSMENT: CLINICAL IMPRESSION: Pt used good form with exercises in clinic today. Minimal restriction noted with cupping. Pt was fatigued but felt good at completion of treatment.   OBJECTIVE IMPAIRMENTS: decreased knowledge of condition, decreased ROM, increased edema, and increased fascial restrictions.   ACTIVITY LIMITATIONS: none  PARTICIPATION LIMITATIONS: none  PERSONAL FACTORS: Time since onset of injury/illness/exacerbation and 1 comorbidity:  SLNB, radiation  are also affecting patient's functional outcome.   REHAB POTENTIAL: Good  CLINICAL DECISION MAKING: Stable/uncomplicated  EVALUATION COMPLEXITY: Moderate  GOALS: Goals reviewed with patient? Yes  SHORT TERM GOALS=LTGs: Target date: 09/20/23  Pt will report reaching in the car is improved by at least 50%  Baseline: Goal status: MET  2.  Pt will be ind with self massage for the breast incisions Baseline:  Goal status: ONGOING  3.  Pt will be given information for [employer] to nature for bras as needed Baseline:  Goal status: MET   4.  Pt will improve Rt shoulder AROM to 90deg to show improved mobility.  Baseline:  Goal status: MET  5 Pt will be ind with stretches and exercises for continued self care.   NEW   PLAN:  PT FREQUENCY: 1-2x/week  PT DURATION: 4 weeks - 6 weeks added for timing.   PLANNED INTERVENTIONS: Therapeutic exercises, Therapeutic activity, Patient/Family education, Self Care, Joint mobilization, Manual therapy, and Re-evaluation, DN  PLAN FOR NEXT SESSION: foam roll, Rt shoulder MFR: pec, lat, PROM, stretching as needed, scar massage education, cupping, taping, DN as needed,   Waynette Buttery, PT 09/01/2023, 3:55 PM

## 2023-09-02 ENCOUNTER — Encounter
Payer: BC Managed Care – PPO | Attending: Physical Medicine and Rehabilitation | Admitting: Physical Medicine and Rehabilitation

## 2023-09-02 VITALS — BP 127/84 | HR 74 | Ht 65.5 in | Wt 166.0 lb

## 2023-09-02 DIAGNOSIS — T451X5A Adverse effect of antineoplastic and immunosuppressive drugs, initial encounter: Secondary | ICD-10-CM | POA: Insufficient documentation

## 2023-09-02 DIAGNOSIS — G620 Drug-induced polyneuropathy: Secondary | ICD-10-CM | POA: Diagnosis not present

## 2023-09-02 DIAGNOSIS — E663 Overweight: Secondary | ICD-10-CM | POA: Insufficient documentation

## 2023-09-02 DIAGNOSIS — Z859 Personal history of malignant neoplasm, unspecified: Secondary | ICD-10-CM | POA: Insufficient documentation

## 2023-09-02 DIAGNOSIS — M7918 Myalgia, other site: Secondary | ICD-10-CM | POA: Diagnosis present

## 2023-09-02 DIAGNOSIS — R232 Flushing: Secondary | ICD-10-CM | POA: Insufficient documentation

## 2023-09-02 MED ORDER — LIDOCAINE HCL 1 % IJ SOLN
3.0000 mL | Freq: Once | INTRAMUSCULAR | Status: AC
Start: 2023-09-02 — End: 2023-09-02
  Administered 2023-09-02: 3 mL

## 2023-09-02 NOTE — Addendum Note (Signed)
Addended by: Horton Chin on: 09/02/2023 11:54 AM   Modules accepted: Level of Service

## 2023-09-02 NOTE — Progress Notes (Signed)
Trigger Point Injection  Indication: Thoracic myofascial pain not relieved by medication management and other conservative care.  Informed consent was obtained after describing risk and benefits of the procedure with the patient, this includes bleeding, bruising, infection and medication side effects.  The patient wishes to proceed and has given written consent.  The patient was placed in a seated position.  The area of pain was marked and prepped with Betadine.  It was entered with a 25-gauge 1/2 inch needle and a total of 5 mL of 1% lidocaine and normal saline was injected into a total of 4 trigger points, after negative draw back for blood.  The patient tolerated the procedure well.  Post procedure instructions were given.  

## 2023-09-02 NOTE — Progress Notes (Signed)
Subjective:    Patient ID: Hannah Woodard, female    DOB: Aug 09, 1971, 52 y.o.   MRN: [id number]  HPI  Hannah Woodard is a 52 year old woman who presents for f/u of athralgias secondary to chemotherapy.   1) Chemotherapy induced athralgias -had a stumble since last visit -discussed that her DEXA showed a reduction of her bone density but not to osteopenic levels -she uses CBD oil with menthol and capsaicin and ice socks but these are sometimes too cold for her. Has not needed this as often -got 12 weeks relief from Qutenza after the first two applications, but she was not sure if it was efficacious last time.  -she continues to take gabapentin -her dog ate one of her orthotics and this worsened her pain recently -she continues to take liposomal vitamin C and 10,000U vitamin D per day.  -most pain was in the ball of the feet and big toes.  -she felt most benefit from the first Qutenza application, she felt less benefit from the last one. She has some burning the day of application but no uncomfortable burning afterward. After the more recent applications she has had a few days of burning.  -She thinks her pain may have been a little worse because she has been active.  -she asks whether she can go for a walk today, she got a new puppy -she used oral CBD with chem -Initially pain about 8 at worst in the mornings, now down to 5/10.  -she feels traditional medicine is still not open to herbal medicine -the neuropathy started with breast cancer chemotherapy.  -pain right now is 4/10 -pain is intermittent.  -has never tried Pre-Dx -she was on Taxol and Herceptin.  -she hates that she is on any drugs -she thinks she eats healthy and she takes vitamins -pain is mostly present on dorsum of both feet, as well as balls and heels. -charlie horses improved -she gets liposomal vitamin C -she benefited from the exercises given to her by PT -irritated right hip bursa using the foam roller -worse  some tight jeans to church and developed a rash.  -could not abduct hip for about a week.  -she is nervous about the foam roller -was bending leg back and forth for several weeks and then started feeling pain on the medial aspect of big toe  2) High iron level -she was told not to take iron supplement since her level was too high.   3) Hot flashes -continues to take gabapentin for these -not as bad -helps to stay away from caffeine, spicy foods, chocolate -she would like to wean off the gabapentin  4) Rash -she easily gets rashes   5) Poison Ivy: -she recently got while gardening.   6) Peripheral neuropathy: -she cannot walk on the hot sands -she is interested in trying Dotetta  7) Bunion: -she would like referral to podiatry  8) Scar tissue: -breasts are sore from scar tissue   Pain Inventory Average Pain 3 Pain Right Now 4 My pain is intermittent  LOCATION OF PAIN  feet,toes,leg,neck,thigh,fingers,back,hip,neck  BOWEL Number of stools per week: 7 Oral laxative use fiber powder Type of laxative  Enema or suppository use No  History of colostomy No  Incontinent No   BLADDER Normal  Able to self cath No  Bladder incontinence No  Frequent urination No  Leakage with coughing No  Difficulty starting stream No  Incomplete bladder emptying Yes    Mobility walk without assistance ability  to climb steps?  yes do you drive?  yes  Function employed # of hrs/week 37.5  Neuro/Psych numbness tingling  Prior Studies N/a  Physicians involved in your care N/a   Family History  Problem Relation Age of Onset   Ovarian cancer Mother 49   Prostate cancer Father 80       'high gleason' unsure number   Ulcerative colitis Sister    Other Sister        abn ovaian growth, partial hysterectomy   Basal cell carcinoma Sister 71   Uterine cancer Maternal Aunt 26   Stroke Maternal Aunt 66   Basal cell carcinoma Brother    Skin cancer Paternal Uncle    Skin  cancer Maternal Grandmother    Stroke Paternal Grandfather 66   Hydrocephalus Cousin    Social History   Socioeconomic History   Marital status: Married    Spouse name: Not on file   Number of children: Not on file   Years of education: Not on file   Highest education level: Not on file  Occupational History   Occupation: Transport planner  Tobacco Use   Smoking status: Never   Smokeless tobacco: Never  Vaping Use   Vaping status: Never Used  Substance and Sexual Activity   Alcohol use: Not Currently    Comment: rarely    Drug use: No   Sexual activity: Yes    Partners: Male    Birth control/protection: Other-see comments    Comment: chemical menopaus  Other Topics Concern   Not on file  Social History Narrative   Not on file   Social Determinants of Health   Financial Resource Strain: Not on file  Food Insecurity: Not on file  Transportation Needs: Not on file  Physical Activity: Not on file  Stress: Not on file  Social Connections: Not on file   Past Surgical History:  Procedure Laterality Date   PORT-A-CATH REMOVAL Left 2020   right lumpectomy, sentinel node biopsy, and bilateral mammoplasty  10/2017   [facility]   Past Medical History:  Diagnosis Date   Breast cancer (HCC)    Family history of ovarian cancer 09/02/2017   Family history of prostate cancer in father 09/02/2017   Family history of uterine cancer 09/02/2017   Plantar fasciitis    BP 127/84   Pulse 74   Ht 5' 5.5" (1.664 m)   Wt 166 lb (75.3 kg)   LMP 10/05/2017   SpO2 97%   BMI 27.20 kg/m   Opioid Risk Score:   Fall Risk Score:  `1  Depression screen PHQ 2/9     06/07/2023    2:42 PM 03/30/2023   10:17 AM 03/02/2023    2:55 PM 09/07/2022    9:29 AM 12/09/2021    2:05 PM 09/02/2021    3:09 PM 08/26/2021    9:51 AM  Depression screen PHQ 2/9  Decreased Interest 0 0 0 0 0 0 0  Down, Depressed, Hopeless 0 0 0 0 0 0 1  PHQ - 2 Score 0 0 0 0 0 0 1  Altered sleeping       2  Tired,  decreased energy       2  Change in appetite       0  Feeling bad or failure about yourself        1  Trouble concentrating       0  Moving slowly or fidgety/restless       0  Suicidal thoughts       0  PHQ-9 Score       6       Review of Systems  Constitutional:  Positive for chills, fever and unexpected weight change.  HENT: Negative.    Eyes: Negative.   Respiratory: Negative.    Cardiovascular: Negative.  Negative for chest pain.  Endocrine: Negative.   Genitourinary: Negative.   Musculoskeletal: Negative.   Skin: Negative.   Allergic/Immunologic: Negative.   Neurological: Negative.   Hematological: Negative.   Psychiatric/Behavioral: Negative.         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 IFC, NMES and TENS medically necessary Treatment Rx: Daily @ 30-40 minutes per treatment PRN. Zynex NexWave only, no substitutions. Treatment Goals: 1) To reduce and/or eliminate pain 2) To improve functional capacity and Activities of daily living 3) To reduce or prevent the need for oral medications 4) To improve circulation in the injured region 5) To decrease or prevent muscle spasm and muscle atrophy 6) To provide a self-management tool to the patient The patient has not  sufficiently improved with conservative care. Numerous studies indexed by Medline and PubMed.gov have shown Neuromuscular, Interferential, and TENS stimulators to reduce pain, improve function, and reduce medication use in injured patients. Continued use of this evidence based, safe, drug free treatment is both reasonable and medically necessary at this time.   -discussed Doterra essential oils  -Discussed current symptoms of pain and history of pain.  -Discussed benefits of exercise in reducing pain. -continue CBD oil -commended on trying ashwagandha -will not repeat Qutenza since did not help last time  -discussed topamax and its possible side effect of bone loss  -Discussed current symptoms of pain and history of pain.  -Discussed benefits of exercise in reducing pain. -Discussed following foods that may reduce pain: 1) Ginger (especially studied for arthritis)- reduce leukotriene production to decrease inflammation 2) Blueberries- high in phytonutrients that decrease inflammation 3) Salmon- marine omega-3s reduce joint swelling and pain 4) Pumpkin seeds- reduce inflammation 5) dark chocolate- reduces inflammation 6) turmeric- reduces inflammation 7) tart cherries - reduce pain and stiffness 8) extra virgin olive oil - its compound olecanthal helps to block prostaglandins  9) chili peppers- can be eaten or applied topically via capsaicin 10) mint- helpful for headache, muscle aches, joint pain, and itching 11) garlic- reduces inflammation  Link to further information on diet for chronic pain: [email or web address].[email or web address]   2) Hot flashes: -continue gabapentin PRN -discussed that she is on such a low dose that she does not need to wean off.   3) Overweight: -discussed risks and benefits of calorie restriction -discussed that quality of food I believe is more important than calories -Educated that current weight is  160, commended on losing 10 lbs since I met her -encouraged  protein -discussed that decreasing weight can help decrease weight on joints, and thus lessen pain.  -Educated regarding health benefits of weight loss- for pain, general health, chronic disease prevention, immune health, mental health.  -encouraged higher protein, lower bread.  -Will monitor weight every visit.  -Consider Roobois tea daily.  -Discussed the benefits of intermittent fasting. -Discussed foods that can assist in weight loss: 1) leafy greens- high in fiber and nutrients 2) dark chocolate- improves metabolism (if prefer sweetened, best to sweeten with honey instead of sugar).  3) cruciferous vegetables- high in fiber and protein 4) full fat yogurt: high in healthy fat, protein, calcium, and probiotics 5) apples- high in a variety of phytochemicals 6) nuts- high in fiber and protein that increase feelings of fullness 7) grapefruit: rich in nutrients, antioxidants, and fiber (not to be taken with anticoagulation) 8) beans- high in protein and fiber 9) salmon- has high quality protein and healthy fats 10) green tea- rich in polyphenols 11) eggs- rich in choline and vitamin D 12) tuna- high protein, boosts metabolism 13) avocado- decreases visceral abdominal fat 14) chicken (pasture raised): high in protein and iron 15) blueberries- reduce abdominal fat and cholesterol 16) whole grains- decreases calories retained during digestion, speeds metabolism 17) chia seeds- curb appetite 18) chilies- increases fat metabolism  -Discussed supplements that can be used:  1) Metatrim 400mg  BID 30 minutes before breakfast and dinner  2) Sphaeranthus indicus and Garcinia mangostana (combinations of these and #1 can be found in capsicum and zychrome  3) green coffee bean extract 400mg  twice per day or Irvingia (african mango) 150 to 300mg  twice per day.  4) Athralgias:  -discussed response to therapy -commended on being able to walk  1 hour and a half! -discussed the results of her recent DEXA scan  5) Pain on bilateral big toes: -discussed toe spacing socks -discussed using wide toe box -discussed that she has formation of mild bunion -continue insoles  6) Insomnia: -discussed wean of gabapentin to 200mg  HS -continue pregnancy pillow -discussed herbal teas, tart cherry juice, pistachios -discussed that she is a left side sleeper -discussed her challenges in finding the right pillow.  -Try to go outside near sunrise -Get exercise during the day.  -Turn off all devices an hour before bedtime.  -Teas that can benefit: chamomile, valerian root, Brahmi (Bacopa) -Can consider over the counter melatonin, magnesium, and/or L-theanine. Melatonin is an anti-oxidant with multiple health benefits. Magnesium is involved in greater than 300 enzymatic reactions in the body and most of [place] are deficient as our soil is often depleted. There are 7 different types of magnesium- Bioptemizer's is a supplement with all 7 types, and each has unique benefits. Magnesium can also help with constipation and anxiety.  -Pistachios naturally increase the production of melatonin -Cozy Earth bamboo bed sheets are free from toxic chemicals.  -Tart cherry juice or a tart cherry supplement can improve sleep and soreness post-workout   7) Protracted posture -discussed exercises for postural correction -continue PT -discussed the role that foot abnormalities can play in contributing to postural abnormalities -discussed that dry needling helped  8) HTN: -continue magnesium supplement -Advised checking BP daily at home and logging results to bring into follow-up appointment with PCP and myself. -Reviewed BP meds today.  -Advised regarding healthy foods that can help lower blood pressure and provided with a list: 1) citrus foods- high in vitamins and minerals 2) salmon and other fatty fish - reduces inflammation and oxylipins 3) swiss chard (leafy  green)- high level of nitrates 4) pumpkin seeds- one of the best natural sources of magnesium 5) Beans and lentils- high in fiber, magnesium, and potassium 6) Berries- high in flavonoids 7) Amaranth (whole grain, can be cooked similarly to rice and oats)- high in magnesium and fiber 8) Pistachios- even more effective at reducing BP than other nuts 9) Carrots- high in phenolic compounds that relax blood vessels and reduce inflammation 10) Celery- contain phthalides that relax tissues of arterial walls 11) Tomatoes- can also improve cholesterol and reduce risk of heart disease 12) Broccoli- good source of magnesium, calcium, and potassium 13) Greek yogurt: high in potassium and calcium 14) Herbs and spices: Celery seed, cilantro, saffron, lemongrass, black cumin, ginseng, cinnamon, cardamom, sweet basil, and ginger 15) Chia and flax seeds- also help to lower cholesterol and blood sugar 16) Beets- high levels of nitrates that relax blood vessels  17) spinach and bananas- high in potassium  -Provided lise of supplements that can help with hypertension:  1) magnesium: one high quality brand is Bioptemizers since it contains all 7 types of magnesium, otherwise over the counter magnesium gluconate 400mg  is a good option 2) B vitamins 3) vitamin D 4) potassium 5) CoQ10 6) L-arginine 7) Vitamin C 8) Beetroot -Educated that goal BP is 120/80. -Made goal to incorporate some of the above foods into diet.     7) Bunion: -discussed loosening of her laces.  -referred to podiatry  8) Pelvic pain: Continue pelvic floor pain  9) Fibroids: -continue regular scans  10) History of cancer: -discussed her concerns with Tamoxifen, discussed that her recent DEXA scan was normal  11) Right knee pain s/p fall: -discussed fall and her subsequent knee pain  12) Scar tissue/lymphedema: -referred to cancer rehab for lymphatic drainage/massage  13) Right thumb pain:  -continue CBD oil -discussed  that she has a sharp pain -discussed that typing aggravates her pain  14) Thoracic/cervical myofascial pain Trigger Point Injection  Indication: Myofascial pain not relieved by medication management and other conservative care.  Informed consent was obtained after describing risk and benefits of the procedure with the patient, this includes bleeding, bruising, infection and medication side effects.  The patient wishes to proceed and has given written consent.  The patient was placed in a seated position.  The area of pain was marked and prepped with Betadine.  It was entered with a 25-gauge 1/2 inch needle and a total of 5 mL of 1% lidocaine and normal saline was injected into a total of 4 trigger points, after negative draw back for blood.  The patient tolerated the procedure well.  Post procedure instructions were given.

## 2023-09-07 ENCOUNTER — Ambulatory Visit: Payer: BC Managed Care – PPO | Admitting: Rehabilitation

## 2023-09-07 ENCOUNTER — Encounter: Payer: Self-pay | Admitting: Rehabilitation

## 2023-09-07 DIAGNOSIS — M25611 Stiffness of right shoulder, not elsewhere classified: Secondary | ICD-10-CM

## 2023-09-07 DIAGNOSIS — L599 Disorder of the skin and subcutaneous tissue related to radiation, unspecified: Secondary | ICD-10-CM

## 2023-09-07 DIAGNOSIS — Z483 Aftercare following surgery for neoplasm: Secondary | ICD-10-CM | POA: Diagnosis not present

## 2023-09-07 NOTE — Therapy (Signed)
OUTPATIENT PHYSICAL THERAPY  UPPER EXTREMITY ONCOLOGY Treatment  Patient Name: Hannah Woodard MRN: [id number] DOB:08-28-1971, 53 y.o., female Today's Date: 09/07/2023  END OF SESSION:  PT End of Session - 09/07/23 1601     Visit Number 15    Number of Visits 19    Date for PT Re-Evaluation 09/20/23    PT Start Time 1604    PT Stop Time 1702    PT Time Calculation (min) 58 min    Activity Tolerance Patient tolerated treatment well    Behavior During Therapy [facility] for tasks assessed/performed                   Past Medical History:  Diagnosis Date   Breast cancer (HCC)    Family history of ovarian cancer 09/02/2017   Family history of prostate cancer in father 09/02/2017   Family history of uterine cancer 09/02/2017   Plantar fasciitis    Past Surgical History:  Procedure Laterality Date   PORT-A-CATH REMOVAL Left 2020   right lumpectomy, sentinel node biopsy, and bilateral mammoplasty  10/2017   [facility]   Patient Active Problem List   Diagnosis Date Noted   Overweight (BMI 25.0-29.9) 12/31/2020   Hyperlipidemia 12/30/2020   Neuropathy due to chemotherapeutic drug (HCC) 12/31/2017   Port-A-Cath in place 11/12/2017   Genetic testing 09/15/2017   Malignant neoplasm of upper-inner quadrant of right breast in female, estrogen receptor positive (HCC) 08/27/2017   Vitamin D deficiency 12/09/2015   Medication management 12/09/2015   Dense breast tissue 12/09/2015    PCP: Nadyne Coombes, MD  REFERRING PROVIDER: Sula Soda, MD  REFERRING DIAG:  Diagnosis  L90.5 (ICD-10-CM) - Scar tissue    THERAPY DIAG:  Aftercare following surgery for neoplasm  Disorder of the skin and subcutaneous tissue related to radiation, unspecified  Stiffness of right shoulder, not elsewhere classified  ONSET DATE: 2019  Rationale for Evaluation and Treatment: Rehabilitation  SUBJECTIVE:                                                                                                                                                                                            SUBJECTIVE STATEMENT:  I got trigger point injections in the UT and mid back. It helped the mid back but I am sore in the upper trap part.    EVAL: tightness on bilateral incisions Rt>Lt which has been worse the past 6 months.  I also feel tight when I reach with my Rt hand.    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 Rt breast IDC with lumpectomy with reduction and SLNB in  2019. 1 negative node removed. Taxol stopped due to neuropathy. Radiation completed.   PAIN:  Are you having pain? Yes NPRS scale: 2/10 upperback, feet 3/10 Pain location: upper back, feet Pain orientation: Right and Left  PAIN TYPE: tight Pain description: constant  Aggravating factors: driving, using arms a lot, slouching Relieving factors: CBD,good posture, anti inflammatory  PRECAUTIONS: Rt lymphedema risk   RED FLAGS: None   WEIGHT BEARING RESTRICTIONS: No  FALLS:  Has patient fallen in last 6 months? Yes. Number of falls due to CIPN, x 3, bad with uneven ground   LIVING ENVIRONMENT: Lives with: lives with their family and lives with their spouse  OCCUPATION: works with hearing impaired for [employer]   LEISURE: walking dog 0.6 miles per day   HAND DOMINANCE: left   PRIOR LEVEL OF FUNCTION: Independent  PATIENT GOALS: Try and decrease pain   OBJECTIVE:  COGNITION: Overall cognitive status: Within functional limits for tasks assessed   PALPATION: Fat necrosis medial Rt breast, increased scar nodularity medial breasts bil  OBSERVATIONS / OTHER ASSESSMENTS: Lt breast larger than Rt due to radiation on the Rt.    POSTURE: rounded shoulders, forward head  UPPER EXTREMITY AROM/PROM:  A/PROM RIGHT   eval  08/09/23  Shoulder extension 52 50  Shoulder flexion 165 162  Shoulder abduction 162 160  Shoulder internal rotation    Shoulder external rotation 89 - slight  pec tightness* / to 80 in supine with shoulder to 90deg of abduction 92    (Blank rows = not tested)  A/PROM LEFT   eval 08/09/23  Shoulder extension 40 40  Shoulder flexion 165 165  Shoulder abduction 165 165  Shoulder internal rotation    Shoulder external rotation 94 / to 90deg in supine with shoulder 90deg of abduction 94    (Blank rows = not tested)  LYMPHEDEMA ASSESSMENTS:   LANDMARK RIGHT  eval  At axilla    15 cm proximal to olecranon process   10 cm proximal to olecranon process 27.6  Olecranon process 25.2  15 cm proximal to ulnar styloid process   10 cm proximal to ulnar styloid process 21.5  Just proximal to ulnar styloid process 16.5  Across hand at thumb web space 19  At base of 2nd digit 6.0  (Blank rows = not tested)  LANDMARK LEFT  eval  At axilla    15 cm proximal to olecranon process   10 cm proximal to olecranon process 28  Olecranon process 26.0  15 cm proximal to ulnar styloid process   10 cm proximal to ulnar styloid process 21.7  Just proximal to ulnar styloid process 16.6  Across hand at thumb web space 18.5  At base of 2nd digit 6.2  (Blank rows = not tested)  QUICK DASH SURVEY: 28% on eval, 36% limited (pt started square dancing and noticed some limitations)   BREAST COMPLAINTS QUESTIONNAIRE Pain:  3 Heaviness: 4 Swollen feeling:0 Tense Skin: 0 Redness: 0 Bra Print: 0 Size of Pores: 0 Hard feeling:  3 Total:    10 /80 A Score over 9 indicates lymphedema issues in the breast   TODAY'S TREATMENT:  DATE:  09/07/23 Pulleys into flexion and abduction x bil Quadruped cat/cow x 6 Childs pose mid/Rt/Lt x 2 each  Went through strength ABC program with each stretch x 15" and each exercise x 10 with 2#  Modified single arm row and tricep stretch to be just with band row and diagonals due to neck pain  with positioning  Pt reports having learned these exercises previously  Scar work right breast along border of incision and axillary incision - smallest cup used x 5 min total using dynamic motions along scar and popping.  No real restriction noted with cup.  Discussed use of TENS for feet and looked for cream from prior clinic that I had had a patient used previously.  I was not able to find this while pt was here but copied it below to show her.   09/01/2023 Pulleys into flexion and abduction x bil Quadruped cat/cow x 6 Childs pose mid/Rt/Lt x 2 each  Half foam roll; chest stretch x 60", Horizontal abduction x 5, alternating flexion x 10, snow angels x10 Supine scapular stabs red x 10 ea Scar work right breast along border of incision and axillary incision - smallest cup used x 5 min total using dynamic motions along scar and popping.  No real restriction noted with cup.  Pt given ABC handout as a resource and shown important first few pages for progression of exercises etc.  08/24/2023 Pulleys into flexion and abduction x bil Quadruped cat/cow x 5 Childs pose mid/Rt/Lt x 2 each  Half foam roll; chest stretch x 60", Horizontal abduction x 5, alternating flexion x 10, snow angels x10 Supine scapular stabs red x 10 ea Scar work right breast along border of incision and axillary incision - pt's very small cup used x 3 min total using dynamic motions along scar.  No real restriction noted with cup.   08/18/2023 Supine scapular stabs red x 10 ea Half foam roll; Horizontal abduction x 5, alternating flexion x 10, snow angels x10 Pt brought 2 small cups and wanted it done on her so she could watch. Explained uses, how to squeeze cup for suction, applying cocoa butter, traction, tilt, S, dynamic cupping only. Performed on Right axillary and breast incision while instructing pt. Pt tried axillary incision but had difficulty seeing what she was fdoing and may need to try standing or show  her husband.  08/09/23 Reassessed goals and status  Demonstrated cupping on my own forearm using cocoa butter and small sillicone cup with demonstration on pressure, movement, etc. And showed pt [employer] silicone facial cups. Also showed pt cancer rehab youtube video on cupping for scar tissue.   STM left anterior shoulder with no pain here today  Scar work right breast along border of incision and axillary incision  08/03/2023 Supine snow angel x 12, alternating flexion x 10 bil  red scap series  x 10 each  Protraction/Retraction 3# bil x 15 Bil tricep extension 3# x 8 - stopping due to being tired  STM left UT, pectorals, anterior shoulder with pain here - noted to be with pectoralis activation vs biceps Scar work right breast along border of incision and axillary incision  07/29/2023 Pulleys warm up x flexion and abduction  1/2 foam roller: snow angel x 10, alternating flexion x 10 bil  Yellow scap series  x 10 each  Protraction/Retraction 2# bil x 10 STM left UT, pectorals, scapular region with cocoa butter, Repeated on right as on left  Scar work right breast along border of incision  07/22/2023 1/2 foam roller: snow angel x 10, alternating flexion x 10 bil , alternating horizontal abd x 10 (easy)  Yellow scap series y  on foam roll x 10 each with cueing for reminder of motion for each - pt used good speed today 1/2 foam roller: snow angel x 10, alternating flexion x 10 bil , alternating horizontal abd x 10 (easy)  Yellow scap series on foam roll  x 10 each with cueing for reminder of motion for each - vcs to slow down STM left UT, pectorals, scapular region with cocoa butter, Repeated on right as on left Scar work right breast along border of icision, avoiding area of skin irritation. Discussed second to [facility] for bras however per pt. they don't make them large enough. (Size G)  PATIENT EDUCATION:  Education details: POC, new stretches Person educated: Patient Education  method: Explanation, Demonstration, and Handouts Education comprehension: verbalized understanding  HOME EXERCISE PROGRAM: [id number]   ASSESSMENT: CLINICAL IMPRESSION: Continued with more focus on strengthening today.  The breast massage seems to have plateaued  in terms of changes in skin tension and pain.    OBJECTIVE IMPAIRMENTS: decreased knowledge of condition, decreased ROM, increased edema, and increased fascial restrictions.   ACTIVITY LIMITATIONS: none  PARTICIPATION LIMITATIONS: none  PERSONAL FACTORS: Time since onset of injury/illness/exacerbation and 1 comorbidity: SLNB, radiation  are also affecting patient's functional outcome.   REHAB POTENTIAL: Good  CLINICAL DECISION MAKING: Stable/uncomplicated  EVALUATION COMPLEXITY: Moderate  GOALS: Goals reviewed with patient? Yes  SHORT TERM GOALS=LTGs: Target date: 09/20/23  Pt will report reaching in the car is improved by at least 50%  Baseline: Goal status: MET  2.  Pt will be ind with self massage for the breast incisions Baseline:  Goal status: ONGOING  3.  Pt will be given information for second to nature for bras as needed Baseline:  Goal status: MET   4.  Pt will improve Rt shoulder AROM to 90deg to show improved mobility.  Baseline:  Goal status: MET  5 Pt will be ind with stretches and exercises for continued self care.   NEW   PLAN:  PT FREQUENCY: 1-2x/week  PT DURATION: 4 weeks - 6 weeks added for timing.   PLANNED INTERVENTIONS: Therapeutic exercises, Therapeutic activity, Patient/Family education, Self Care, Joint mobilization, Manual therapy, and Re-evaluation, DN  PLAN FOR NEXT SESSION: let pt know about cream below:, try TENS for neuropathy? foam roll, Rt shoulder MFR: pec, lat, PROM, stretching as needed, scar massage education, cupping, taping, DN as needed,   Neuropathy Cream [facility]  Peripheral Neuropathy cream: bupivavaine 1%, Doxepin 3%, Gabapentin 6%,  Pentoxifylline 3%, topiramate 1%  Idamae Lusher, PT 09/07/2023, 5:12 PM

## 2023-09-09 ENCOUNTER — Ambulatory Visit: Payer: BC Managed Care – PPO | Admitting: Rehabilitation

## 2023-09-09 DIAGNOSIS — Z483 Aftercare following surgery for neoplasm: Secondary | ICD-10-CM

## 2023-09-09 DIAGNOSIS — L599 Disorder of the skin and subcutaneous tissue related to radiation, unspecified: Secondary | ICD-10-CM

## 2023-09-09 DIAGNOSIS — M25611 Stiffness of right shoulder, not elsewhere classified: Secondary | ICD-10-CM

## 2023-09-09 NOTE — Therapy (Signed)
OUTPATIENT PHYSICAL THERAPY  UPPER EXTREMITY ONCOLOGY Treatment  Patient Name: Hannah Woodard MRN: [id number] DOB:1971-01-09, 52 y.o., female Today's Date: 09/15/2023  END OF SESSION:  PT End of Session - 09/15/23 1052     Visit Number 16    Number of Visits 19    Date for PT Re-Evaluation 09/20/23    PT Start Time 1600    PT Stop Time 1655    PT Time Calculation (min) 55 min    Activity Tolerance Patient tolerated treatment well    Behavior During Therapy [facility] for tasks assessed/performed                    Past Medical History:  Diagnosis Date   Breast cancer (HCC)    Family history of ovarian cancer 09/02/2017   Family history of prostate cancer in father 09/02/2017   Family history of uterine cancer 09/02/2017   Plantar fasciitis    Past Surgical History:  Procedure Laterality Date   PORT-A-CATH REMOVAL Left 2020   right lumpectomy, sentinel node biopsy, and bilateral mammoplasty  10/2017   [facility]   Patient Active Problem List   Diagnosis Date Noted   Overweight (BMI 25.0-29.9) 12/31/2020   Hyperlipidemia 12/30/2020   Neuropathy due to chemotherapeutic drug (HCC) 12/31/2017   Port-A-Cath in place 11/12/2017   Genetic testing 09/15/2017   Malignant neoplasm of upper-inner quadrant of right breast in female, estrogen receptor positive (HCC) 08/27/2017   Vitamin D deficiency 12/09/2015   Medication management 12/09/2015   Dense breast tissue 12/09/2015    PCP: Nadyne Coombes, MD  REFERRING PROVIDER: Sula Soda, MD  REFERRING DIAG:  Diagnosis  L90.5 (ICD-10-CM) - Scar tissue    THERAPY DIAG:  Aftercare following surgery for neoplasm  Disorder of the skin and subcutaneous tissue related to radiation, unspecified  Stiffness of right shoulder, not elsewhere classified  ONSET DATE: 2019  Rationale for Evaluation and Treatment: Rehabilitation  SUBJECTIVE:                                                                                                                                                                                            SUBJECTIVE STATEMENT:  I got trigger point injections in the UT and mid back. It helped the mid back but I am sore in the upper trap part.    EVAL: tightness on bilateral incisions Rt>Lt which has been worse the past 6 months.  I also feel tight when I reach with my Rt hand.    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 Rt breast IDC with lumpectomy with reduction and SLNB  in 2019. 1 negative node removed. Taxol stopped due to neuropathy. Radiation completed.   PAIN:  Are you having pain? Yes NPRS scale: 2/10 upperback, feet 3/10 Pain location: upper back, feet Pain orientation: Right and Left  PAIN TYPE: tight Pain description: constant  Aggravating factors: driving, using arms a lot, slouching Relieving factors: CBD,good posture, anti inflammatory  PRECAUTIONS: Rt lymphedema risk   RED FLAGS: None   WEIGHT BEARING RESTRICTIONS: No  FALLS:  Has patient fallen in last 6 months? Yes. Number of falls due to CIPN, x 3, bad with uneven ground   LIVING ENVIRONMENT: Lives with: lives with their family and lives with their spouse  OCCUPATION: works with hearing impaired for [employer]   LEISURE: walking dog 0.6 miles per day   HAND DOMINANCE: left   PRIOR LEVEL OF FUNCTION: Independent  PATIENT GOALS: Try and decrease pain   OBJECTIVE:  COGNITION: Overall cognitive status: Within functional limits for tasks assessed   PALPATION: Fat necrosis medial Rt breast, increased scar nodularity medial breasts bil  OBSERVATIONS / OTHER ASSESSMENTS: Lt breast larger than Rt due to radiation on the Rt.    POSTURE: rounded shoulders, forward head  UPPER EXTREMITY AROM/PROM:  A/PROM RIGHT   eval  08/09/23  Shoulder extension 52 50  Shoulder flexion 165 162  Shoulder abduction 162 160  Shoulder internal rotation    Shoulder external rotation 89 - slight  pec tightness* / to 80 in supine with shoulder to 90deg of abduction 92    (Blank rows = not tested)  A/PROM LEFT   eval 08/09/23  Shoulder extension 40 40  Shoulder flexion 165 165  Shoulder abduction 165 165  Shoulder internal rotation    Shoulder external rotation 94 / to 90deg in supine with shoulder 90deg of abduction 94    (Blank rows = not tested)  LYMPHEDEMA ASSESSMENTS:   LANDMARK RIGHT  eval  At axilla    15 cm proximal to olecranon process   10 cm proximal to olecranon process 27.6  Olecranon process 25.2  15 cm proximal to ulnar styloid process   10 cm proximal to ulnar styloid process 21.5  Just proximal to ulnar styloid process 16.5  Across hand at thumb web space 19  At base of 2nd digit 6.0  (Blank rows = not tested)  LANDMARK LEFT  eval  At axilla    15 cm proximal to olecranon process   10 cm proximal to olecranon process 28  Olecranon process 26.0  15 cm proximal to ulnar styloid process   10 cm proximal to ulnar styloid process 21.7  Just proximal to ulnar styloid process 16.6  Across hand at thumb web space 18.5  At base of 2nd digit 6.2  (Blank rows = not tested)  QUICK DASH SURVEY: 28% on eval, 36% limited (pt started square dancing and noticed some limitations)   BREAST COMPLAINTS QUESTIONNAIRE Pain:  3 Heaviness: 4 Swollen feeling:0 Tense Skin: 0 Redness: 0 Bra Print: 0 Size of Pores: 0 Hard feeling:  3 Total:    10 /80 A Score over 9 indicates lymphedema issues in the breast   TODAY'S TREATMENT:  DATE:  09/09/23 Pulleys into flexion and abduction x bil Quadruped cat/cow x 6 Childs pose mid/Rt/Lt x 2 each  Went through strength ABC program: bridging x 15, quadruped alt UE/LE x 5 bil (some Lt knee pain), chest press 2# x 15, bicep curls 2# x 15, single arm cable machine row x 15 Modified  single arm row and tricep stretch to be just with band row and diagonals due to neck pain with positioning Scar work right breast along border of incision and axillary incision - smallest cup used x 5 min total using dynamic motions along scar and popping.  No real restriction noted with cup.   09/07/23 Pulleys into flexion and abduction x bil Quadruped cat/cow x 6 Childs pose mid/Rt/Lt x 2 each  Went through strength ABC program with each stretch x 15" and each exercise x 10 with 2#  Modified single arm row and tricep stretch to be just with band row and diagonals due to neck pain with positioning  Pt reports having learned these exercises previously  Scar work right breast along border of incision and axillary incision - smallest cup used x 5 min total using dynamic motions along scar and popping.  No real restriction noted with cup.  Discussed use of TENS for feet and looked for cream from prior clinic that I had had a patient used previously.  I was not able to find this while pt was here but copied it below to show her.   09/01/2023 Pulleys into flexion and abduction x bil Quadruped cat/cow x 6 Childs pose mid/Rt/Lt x 2 each  Half foam roll; chest stretch x 60", Horizontal abduction x 5, alternating flexion x 10, snow angels x10 Supine scapular stabs red x 10 ea Scar work right breast along border of incision and axillary incision - smallest cup used x 5 min total using dynamic motions along scar and popping.  No real restriction noted with cup.  Pt given ABC handout as a resource and shown important first few pages for progression of exercises etc.   PATIENT EDUCATION:  Education details: POC, new stretches Person educated: Patient Education method: Explanation, Demonstration, and Handouts Education comprehension: verbalized understanding  HOME EXERCISE PROGRAM: [id number]   ASSESSMENT: CLINICAL IMPRESSION: Continued with more focus on strengthening today.  The breast  massage seems to have plateaued  in terms of changes in skin tension and pain.    OBJECTIVE IMPAIRMENTS: decreased knowledge of condition, decreased ROM, increased edema, and increased fascial restrictions.   ACTIVITY LIMITATIONS: none  PARTICIPATION LIMITATIONS: none  PERSONAL FACTORS: Time since onset of injury/illness/exacerbation and 1 comorbidity: SLNB, radiation  are also affecting patient's functional outcome.   REHAB POTENTIAL: Good  CLINICAL DECISION MAKING: Stable/uncomplicated  EVALUATION COMPLEXITY: Moderate  GOALS: Goals reviewed with patient? Yes  SHORT TERM GOALS=LTGs: Target date: 09/20/23  Pt will report reaching in the car is improved by at least 50%  Baseline: Goal status: MET  2.  Pt will be ind with self massage for the breast incisions Baseline:  Goal status: ONGOING  3.  Pt will be given information for second to nature for bras as needed Baseline:  Goal status: MET   4.  Pt will improve Rt shoulder AROM to 90deg to show improved mobility.  Baseline:  Goal status: MET  5 Pt will be ind with stretches and exercises for continued self care.   NEW   PLAN:  PT FREQUENCY: 1-2x/week  PT DURATION: 4 weeks - 6  weeks added for timing.   PLANNED INTERVENTIONS: Therapeutic exercises, Therapeutic activity, Patient/Family education, Self Care, Joint mobilization, Manual therapy, and Re-evaluation, DN  PLAN FOR NEXT SESSION: let pt know about cream below:, try TENS for neuropathy? foam roll, Rt shoulder MFR: pec, lat, PROM, stretching as needed, scar massage education, cupping, taping, DN as needed,   Neuropathy Cream [facility]  Peripheral Neuropathy cream: bupivavaine 1%, Doxepin 3%, Gabapentin 6%, Pentoxifylline 3%, topiramate 1%  Idamae Lusher, PT 09/15/2023, 10:53 AM

## 2023-09-10 ENCOUNTER — Ambulatory Visit: Payer: BC Managed Care – PPO | Admitting: Physical Medicine and Rehabilitation

## 2023-09-14 ENCOUNTER — Ambulatory Visit: Payer: BC Managed Care – PPO

## 2023-09-14 DIAGNOSIS — Z483 Aftercare following surgery for neoplasm: Secondary | ICD-10-CM

## 2023-09-14 DIAGNOSIS — M25611 Stiffness of right shoulder, not elsewhere classified: Secondary | ICD-10-CM

## 2023-09-14 DIAGNOSIS — L599 Disorder of the skin and subcutaneous tissue related to radiation, unspecified: Secondary | ICD-10-CM

## 2023-09-14 NOTE — Patient Instructions (Signed)
Neuropathy Cream  [facility]   Peripheral Neuropathy cream: bupivavaine 1%, Doxepin 3%, Gabapentin 6%, Pentoxifylline 3%, topiramate 1%

## 2023-09-14 NOTE — Therapy (Signed)
OUTPATIENT PHYSICAL THERAPY  UPPER EXTREMITY ONCOLOGY Treatment  Patient Name: Hannah Woodard MRN: [id number] DOB:02/06/1971, 52 y.o., female Today's Date: 09/14/2023  END OF SESSION:  PT End of Session - 09/14/23 1604     Visit Number 17   Number of Visits 19    Date for PT Re-Evaluation 09/20/23    PT Start Time 1604    PT Stop Time 1702    PT Time Calculation (min) 58 min    Activity Tolerance Patient tolerated treatment well    Behavior During Therapy [facility] for tasks assessed/performed                    Past Medical History:  Diagnosis Date   Breast cancer (HCC)    Family history of ovarian cancer 09/02/2017   Family history of prostate cancer in father 09/02/2017   Family history of uterine cancer 09/02/2017   Plantar fasciitis    Past Surgical History:  Procedure Laterality Date   PORT-A-CATH REMOVAL Left 2020   right lumpectomy, sentinel node biopsy, and bilateral mammoplasty  10/2017   [facility]   Patient Active Problem List   Diagnosis Date Noted   Overweight (BMI 25.0-29.9) 12/31/2020   Hyperlipidemia 12/30/2020   Neuropathy due to chemotherapeutic drug (HCC) 12/31/2017   Port-A-Cath in place 11/12/2017   Genetic testing 09/15/2017   Malignant neoplasm of upper-inner quadrant of right breast in female, estrogen receptor positive (HCC) 08/27/2017   Vitamin D deficiency 12/09/2015   Medication management 12/09/2015   Dense breast tissue 12/09/2015    PCP: Nadyne Coombes, MD  REFERRING PROVIDER: Sula Soda, MD  REFERRING DIAG:  Diagnosis  L90.5 (ICD-10-CM) - Scar tissue    THERAPY DIAG:  Aftercare following surgery for neoplasm  Disorder of the skin and subcutaneous tissue related to radiation, unspecified  Stiffness of right shoulder, not elsewhere classified  ONSET DATE: 2019  Rationale for Evaluation and Treatment: Rehabilitation  SUBJECTIVE:                                                                                                                                                                                            SUBJECTIVE STATEMENT:    My right anterior shoulder/pectorals are hurting today from 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 I got a rebounder and the first day I jumped on it for 10 min as hard as I could. I got really sore, so I used my heating pad on it. I have had some right chest pain under the scar over the last week. I was tired after the [facility] handout. I had lidocaine injection on  09/01/2023 but my left UT and shoulder blade is really tight.  EVAL: tightness on bilateral incisions Rt>Lt which has been worse the past 6 months.  I also feel tight when y right anterior shoulder is hurtingI reach with my Rt hand.    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 1 negative node removed. Taxol stopped due to neuropathy. Radiation completed.   PAIN:  Are you having pain? Yes NPRS scale: 3/10 bilateral UT, Right pecs, scapula, feet 5-6/10 Pain location: upper back, feet Pain orientation: Right and Left  PAIN TYPE: tight Pain description: constant  Aggravating factors: driving, using arms a lot, slouching Relieving factors: CBD,good posture, anti inflammatory  PRECAUTIONS: Rt lymphedema risk   RED FLAGS: None   WEIGHT BEARING RESTRICTIONS: No  FALLS:  Has patient fallen in last 6 months? Yes. Number of falls due to CIPN, x 3, bad with uneven ground   LIVING ENVIRONMENT: Lives with: lives with their family and lives with their spouse  OCCUPATION: works with hearing impaired for [employer]   LEISURE: walking dog 0.6 miles per day   HAND DOMINANCE: left   PRIOR LEVEL OF FUNCTION: Independent  PATIENT GOALS: Try and decrease pain   OBJECTIVE:  COGNITION: Overall cognitive status: Within functional limits for tasks assessed   PALPATION: Fat necrosis medial Rt breast, increased scar nodularity  medial breasts bil  OBSERVATIONS / OTHER ASSESSMENTS: Lt breast larger than Rt due to radiation on the Rt.    POSTURE: rounded shoulders, forward head  UPPER EXTREMITY AROM/PROM:  A/PROM RIGHT   eval  08/09/23  Shoulder extension 52 50  Shoulder flexion 165 162  Shoulder abduction 162 160  Shoulder internal rotation    Shoulder external rotation 89 - slight pec tightness* / to 80 in supine with shoulder to 90deg of abduction 92    (Blank rows = not tested)  A/PROM LEFT   eval 08/09/23  Shoulder extension 40 40  Shoulder flexion 165 165  Shoulder abduction 165 165  Shoulder internal rotation    Shoulder external rotation 94 / to 90deg in supine with shoulder 90deg of abduction 94    (Blank rows = not tested)  LYMPHEDEMA ASSESSMENTS:   LANDMARK RIGHT  eval  At axilla    15 cm proximal to olecranon process   10 cm proximal to olecranon process 27.6  Olecranon process 25.2  15 cm proximal to ulnar styloid process   10 cm proximal to ulnar styloid process 21.5  Just proximal to ulnar styloid process 16.5  Across hand at thumb web space 19  At base of 2nd digit 6.0  (Blank rows = not tested)  LANDMARK LEFT  eval  At axilla    15 cm proximal to olecranon process   10 cm proximal to olecranon process 28  Olecranon process 26.0  15 cm proximal to ulnar styloid process   10 cm proximal to ulnar styloid process 21.7  Just proximal to ulnar styloid process 16.6  Across hand at thumb web space 18.5  At base of 2nd digit 6.2  (Blank rows = not tested)  QUICK DASH SURVEY: 28% on eval, 36% limited (pt started square dancing and noticed some limitations)   BREAST COMPLAINTS QUESTIONNAIRE Pain:  3 Heaviness: 4 Swollen feeling:0 Tense Skin: 0 Redness: 0 Bra Print: 0 Size of Pores: 0 Hard feeling:  3 Total:    10 /80 A Score over 9 indicates  lymphedema issues in the breast   TODAY'S TREATMENT:                                                                                                                                     DATE:   09/14/2023 Cat-camel x 10 Childs pose mid/Rt/Lt x 2 each  Micah Flesher through strength ABC program each stretch 1 x 15 sec: bridging x 10, quadruped alt UE/LE x 3 bil (some Lt knee pain), chest press 2# x 15, bicep curls 2# x 15,  row x 15 with red band,Triceps 15 with red band, biceps curls  2#15 reps,  scaption with back against wall2 # x 10,sit to stand from chair without UE x10, step and hold no wt x 7 ea with SBA, heel raises 2# ea x 10, STM to bilateral UT and right pectorals due to complaints of tightness Scar work right breast along border of incision  - smallest cup used x 5 min total using dynamic motions along scar and popping.     09/09/23 Pulleys into flexion and abduction x bil Quadruped cat/cow x 6 Childs pose mid/Rt/Lt x 2 each  Went through strength ABC program: bridging x 15, quadruped alt UE/LE x 5 bil (some Lt knee pain), chest press 2# x 15, bicep curls 2# x 15, single arm cable machine row x 15 Modified single arm row and tricep stretch to be just with band row and diagonals due to neck pain with positioning Scar work right breast along border of incision and axillary incision - smallest cup used x 5 min total using dynamic motions along scar and popping.  No real restriction noted with cup.   09/07/23 Pulleys into flexion and abduction x bil Quadruped cat/cow x 6 Childs pose mid/Rt/Lt x 2 each  Went through strength ABC program with each stretch x 15" and each exercise x 10 with 2#  Modified single arm row and tricep stretch to be just with band row and diagonals due to neck pain with positioning  Pt reports having learned these exercises previously  Scar work right breast along border of incision and axillary incision - smallest cup used x 5 min total using dynamic motions along scar and popping.  No real restriction noted with cup.  Discussed use of TENS for feet and looked for cream from prior  clinic that I had had a patient used previously.  I was not able to find this while pt was here but copied it below to show her.   09/01/2023 Pulleys into flexion and abduction x bil Quadruped cat/cow x 6 Childs pose mid/Rt/Lt x 2 each  Half foam roll; chest stretch x 60", Horizontal abduction x 5, alternating flexion x 10, snow angels x10 Supine scapular stabs red x 10 ea Scar work right breast along border of incision and axillary incision - smallest cup used x 5 min total using dynamic motions  along scar and popping.  No real restriction noted with cup.  Pt given ABC handout as a resource and shown important first few pages for progression of exercises etc.   PATIENT EDUCATION:  Education details: POC, new stretches Person educated: Patient Education method: Programmer, multimedia, Facilities manager, and Handouts Education comprehension: verbalized understanding  HOME EXERCISE PROGRAM: [id number]   ASSESSMENT: CLINICAL IMPRESSION: Continued with more focus on strengthening and emphasis on form and breathing. Pt did well. Modified row and triceps extension and used band. Did not do dead lifts or partial sit ups. Included STM at end of session as pt complained of tightness in  pectorals and UT before initiating exercises. She felt looser afterwards.  OBJECTIVE IMPAIRMENTS: decreased knowledge of condition, decreased ROM, increased edema, and increased fascial restrictions.   ACTIVITY LIMITATIONS: none  PARTICIPATION LIMITATIONS: none  PERSONAL FACTORS: Time since onset of injury/illness/exacerbation and 1 comorbidity: SLNB, radiation  are also affecting patient's functional outcome.   REHAB POTENTIAL: Good  CLINICAL DECISION MAKING: Stable/uncomplicated  EVALUATION COMPLEXITY: Moderate  GOALS: Goals reviewed with patient? Yes  SHORT TERM GOALS=LTGs: Target date: 09/20/23  Pt will report reaching in the car is improved by at least 50%  Baseline: Goal status: MET  2.  Pt will be ind  with self massage for the breast incisions Baseline:  Goal status: ONGOING  3.  Pt will be given information for second to nature for bras as needed Baseline:  Goal status: MET   4.  Pt will improve Rt shoulder AROM to 90deg to show improved mobility.  Baseline:  Goal status: MET  5 Pt will be ind with stretches and exercises for continued self care.   NEW   PLAN:  PT FREQUENCY: 1-2x/week  PT DURATION: 4 weeks - 6 weeks added for timing.   PLANNED INTERVENTIONS: Therapeutic exercises, Therapeutic activity, Patient/Family education, Self Care, Joint mobilization, Manual therapy, and Re-evaluation, DN  PLAN FOR NEXT SESSION: gave pt info about cream below:, try TENS for neuropathy? foam roll, Rt shoulder MFR: pec, lat, PROM, stretching as needed, scar massage education, cupping, taping, DN as needed,   Neuropathy Cream [facility]  Peripheral Neuropathy cream: bupivavaine 1%, Doxepin 3%, Gabapentin 6%, Pentoxifylline 3%, topiramate 1%  Waynette Buttery, PT 09/14/2023, 5:23 PM

## 2023-09-15 ENCOUNTER — Encounter: Payer: Self-pay | Admitting: Rehabilitation

## 2023-09-16 ENCOUNTER — Ambulatory Visit: Payer: BC Managed Care – PPO

## 2023-09-16 DIAGNOSIS — M25611 Stiffness of right shoulder, not elsewhere classified: Secondary | ICD-10-CM

## 2023-09-16 DIAGNOSIS — Z483 Aftercare following surgery for neoplasm: Secondary | ICD-10-CM | POA: Diagnosis not present

## 2023-09-16 DIAGNOSIS — L599 Disorder of the skin and subcutaneous tissue related to radiation, unspecified: Secondary | ICD-10-CM

## 2023-09-16 NOTE — Therapy (Signed)
OUTPATIENT PHYSICAL THERAPY  UPPER EXTREMITY ONCOLOGY Treatment  Patient Name: Hannah Woodard MRN: [id number] DOB:12/18/1970, 52 y.o., female Today's Date: 09/16/2023  END OF SESSION:  PT End of Session - 09/14/23 1604     Visit Number 17   Number of Visits 19    Date for PT Re-Evaluation 09/20/23    PT Start Time 1604    PT Stop Time 1702    PT Time Calculation (min) 58 min    Activity Tolerance Patient tolerated treatment well    Behavior During Therapy [facility] for tasks assessed/performed                    Past Medical History:  Diagnosis Date   Breast cancer (HCC)    Family history of ovarian cancer 09/02/2017   Family history of prostate cancer in father 09/02/2017   Family history of uterine cancer 09/02/2017   Plantar fasciitis    Past Surgical History:  Procedure Laterality Date   PORT-A-CATH REMOVAL Left 2020   right lumpectomy, sentinel node biopsy, and bilateral mammoplasty  10/2017   [facility]   Patient Active Problem List   Diagnosis Date Noted   Overweight (BMI 25.0-29.9) 12/31/2020   Hyperlipidemia 12/30/2020   Neuropathy due to chemotherapeutic drug (HCC) 12/31/2017   Port-A-Cath in place 11/12/2017   Genetic testing 09/15/2017   Malignant neoplasm of upper-inner quadrant of right breast in female, estrogen receptor positive (HCC) 08/27/2017   Vitamin D deficiency 12/09/2015   Medication management 12/09/2015   Dense breast tissue 12/09/2015    PCP: Nadyne Coombes, MD  REFERRING PROVIDER: Sula Soda, MD  REFERRING DIAG:  Diagnosis  L90.5 (ICD-10-CM) - Scar tissue    THERAPY DIAG:  Aftercare following surgery for neoplasm  Disorder of the skin and subcutaneous tissue related to radiation, unspecified  Stiffness of right shoulder, not elsewhere classified  ONSET DATE: 2019  Rationale for Evaluation and Treatment: Rehabilitation  SUBJECTIVE:                                                                                                                                                                                            SUBJECTIVE STATEMENT:  I have to leave early today. 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 Did OK after last visit. My foot hurt afterwards. We walked about 2 miles yesterday and it hurt. I used a TENs unit today for about 10 min and it feels better. Monday is my last day. I think I am doing very well. EVAL: tightness on bilateral incisions Rt>Lt which has been worse the past 6 months.  I  also feel tight when y right anterior shoulder is hurtingI reach with my Rt hand.    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 1 negative node removed. Taxol stopped due to neuropathy. Radiation completed.   PAIN:  Are you having pain? Yes NPRS scale: 3-4/10 scqpular region, Foot 1-2/10 Pain location: upper back, feet Pain orientation: Right and Left  PAIN TYPE: tight Pain description: constant  Aggravating factors: driving, using arms a lot, slouching Relieving factors: CBD,good posture, anti inflammatory  PRECAUTIONS: Rt lymphedema risk   RED FLAGS: None   WEIGHT BEARING RESTRICTIONS: No  FALLS:  Has patient fallen in last 6 months? Yes. Number of falls due to CIPN, x 3, bad with uneven ground   LIVING ENVIRONMENT: Lives with: lives with their family and lives with their spouse  OCCUPATION: works with hearing impaired for [employer]   LEISURE: walking dog 0.6 miles per day   HAND DOMINANCE: left   PRIOR LEVEL OF FUNCTION: Independent  PATIENT GOALS: Try and decrease pain   OBJECTIVE:  COGNITION: Overall cognitive status: Within functional limits for tasks assessed   PALPATION: Fat necrosis medial Rt breast, increased scar nodularity medial breasts bil  OBSERVATIONS / OTHER ASSESSMENTS: Lt breast larger than Rt due to radiation on the Rt.    POSTURE: rounded shoulders, forward  head  UPPER EXTREMITY AROM/PROM:  A/PROM RIGHT   eval  08/09/23  Shoulder extension 52 50  Shoulder flexion 165 162  Shoulder abduction 162 160  Shoulder internal rotation    Shoulder external rotation 89 - slight pec tightness* / to 80 in supine with shoulder to 90deg of abduction 92    (Blank rows = not tested)  A/PROM LEFT   eval 08/09/23  Shoulder extension 40 40  Shoulder flexion 165 165  Shoulder abduction 165 165  Shoulder internal rotation    Shoulder external rotation 94 / to 90deg in supine with shoulder 90deg of abduction 94    (Blank rows = not tested)  LYMPHEDEMA ASSESSMENTS:   LANDMARK RIGHT  eval  At axilla    15 cm proximal to olecranon process   10 cm proximal to olecranon process 27.6  Olecranon process 25.2  15 cm proximal to ulnar styloid process   10 cm proximal to ulnar styloid process 21.5  Just proximal to ulnar styloid process 16.5  Across hand at thumb web space 19  At base of 2nd digit 6.0  (Blank rows = not tested)  LANDMARK LEFT  eval  At axilla    15 cm proximal to olecranon process   10 cm proximal to olecranon process 28  Olecranon process 26.0  15 cm proximal to ulnar styloid process   10 cm proximal to ulnar styloid process 21.7  Just proximal to ulnar styloid process 16.6  Across hand at thumb web space 18.5  At base of 2nd digit 6.2  (Blank rows = not tested)  QUICK DASH SURVEY: 28% on eval, 36% limited (pt started square dancing and noticed some limitations)   BREAST COMPLAINTS QUESTIONNAIRE Pain:  3 Heaviness: 4 Swollen feeling:0 Tense Skin: 0 Redness: 0 Bra Print: 0 Size of Pores: 0 Hard feeling:  3 Total:    10 /80 A Score over 9 indicates lymphedema issues in the breast   TODAY'S TREATMENT:  DATE:  09/16/2023 Cat-camel x 10 Childs pose mid/Rt/Lt x 3 each  Went through  strength ABC program each stretch 1 x 15 sec: bridging x 15, quadruped alt UE/LE x 3 bil (some Lt knee pain), chest press 2# x 15, bicep curls 2# x 15, 3# x 10  row x 15 with red band,Triceps 15 with red band,   scaption with back against wall 3 # x 10,sit to stand from chair without UE x10, heel raises 2# ea x 10, Scar work right breast and axillary incision along border of incision  - smallest cup used x 10 min using dynamic motions along scar and popping.       09/14/2023 Cat-camel x 10 Childs pose mid/Rt/Lt x 2 each  Went through strength ABC program each stretch 1 x 15 sec: bridging x 10, quadruped alt UE/LE x 3 bil (some Lt knee pain), chest press 2# x 15, bicep curls 2# x 15,  row x 15 with red band,Triceps 15 with red band, biceps curls  2#15 reps,  scaption with back against wall2 # x 10,sit to stand from chair without UE x15,  step and hold no wt x 7 ea heel raises 2# ea x 10, STM to bilateral UT and right pectorals due to complaints of tightness Scar work right breast along border of incision  - smallest cup used x 5 min total using dynamic motions along scar and popping.     09/09/23 Pulleys into flexion and abduction x bil Quadruped cat/cow x 6 Childs pose mid/Rt/Lt x 2 each  Went through strength ABC program: bridging x 15, quadruped alt UE/LE x 5 bil (some Lt knee pain), chest press 2# x 15, bicep curls 2# x 15, single arm cable machine row x 15 Modified single arm row and tricep stretch to be just with band row and diagonals due to neck pain with positioning Scar work right breast along border of incision and axillary incision - smallest cup used x 5 min total using dynamic motions along scar and popping.  No real restriction noted with cup.   09/07/23 Pulleys into flexion and abduction x bil Quadruped cat/cow x 6 Childs pose mid/Rt/Lt x 2 each  Went through strength ABC program with each stretch x 15" and each exercise x 10 with 2#  Modified single arm row and  tricep stretch to be just with band row and diagonals due to neck pain with positioning  Pt reports having learned these exercises previously  Scar work right breast along border of incision and axillary incision - smallest cup used x 5 min total using dynamic motions along scar and popping.  No real restriction noted with cup.  Discussed use of TENS for feet and looked for cream from prior clinic that I had had a patient used previously.  I was not able to find this while pt was here but copied it below to show her.   09/01/2023 Pulleys into flexion and abduction x bil Quadruped cat/cow x 6 Childs pose mid/Rt/Lt x 2 each  Half foam roll; chest stretch x 60", Horizontal abduction x 5, alternating flexion x 10, snow angels x10 Supine scapular stabs red x 10 ea Scar work right breast along border of incision and axillary incision - smallest cup used x 5 min total using dynamic motions along scar and popping.  No real restriction noted with cup.  Pt given ABC handout as a resource and shown important first few pages for progression  of exercises etc.   PATIENT EDUCATION:  Education details: POC, new stretches Person educated: Patient Education method: Explanation, Demonstration, and Handouts Education comprehension: verbalized understanding  HOME EXERCISE PROGRAM: [id number]   ASSESSMENT: CLINICAL IMPRESSION: Continued ABC strength handout and cupping with smallest cup to breast/axillary incision. Pt is pleased with her progress and anticipates DC next visit  OBJECTIVE IMPAIRMENTS: decreased knowledge of condition, decreased ROM, increased edema, and increased fascial restrictions.   ACTIVITY LIMITATIONS: none  PARTICIPATION LIMITATIONS: none  PERSONAL FACTORS: Time since onset of injury/illness/exacerbation and 1 comorbidity: SLNB, radiation  are also affecting patient's functional outcome.   REHAB POTENTIAL: Good  CLINICAL DECISION MAKING: Stable/uncomplicated  EVALUATION  COMPLEXITY: Moderate  GOALS: Goals reviewed with patient? Yes  SHORT TERM GOALS=LTGs: Target date: 09/20/23  Pt will report reaching in the car is improved by at least 50%  Baseline: Goal status: MET  2.  Pt will be ind with self massage for the breast incisions Baseline:  Goal status: ONGOING  3.  Pt will be given information for second to nature for bras as needed Baseline:  Goal status: MET   4.  Pt will improve Rt shoulder AROM to 90deg to show improved mobility.  Baseline:  Goal status: MET  5 Pt will be ind with stretches and exercises for continued self care.   NEW   PLAN:  PT FREQUENCY: 1-2x/week  PT DURATION: 4 weeks - 6 weeks added for timing.   PLANNED INTERVENTIONS: Therapeutic exercises, Therapeutic activity, Patient/Family education, Self Care, Joint mobilization, Manual therapy, and Re-evaluation, DN  PLAN FOR NEXT SESSION: gave pt info about cream below:, try TENS for neuropathy? foam roll, Rt shoulder MFR: pec, lat, PROM, stretching as needed, scar massage education, cupping, taping, DN as needed,   Neuropathy Cream [facility]  Peripheral Neuropathy cream: bupivavaine 1%, Doxepin 3%, Gabapentin 6%, Pentoxifylline 3%, topiramate 1%  Waynette Buttery, PT 09/16/2023, 4:53 PM

## 2023-09-17 ENCOUNTER — Other Ambulatory Visit (HOSPITAL_COMMUNITY): Payer: BC Managed Care – PPO

## 2023-09-17 ENCOUNTER — Other Ambulatory Visit: Payer: BC Managed Care – PPO

## 2023-09-17 ENCOUNTER — Other Ambulatory Visit: Payer: Self-pay | Admitting: *Deleted

## 2023-09-17 DIAGNOSIS — Z170 Estrogen receptor positive status [ER+]: Secondary | ICD-10-CM

## 2023-09-20 ENCOUNTER — Ambulatory Visit: Payer: BC Managed Care – PPO | Admitting: Rehabilitation

## 2023-09-20 ENCOUNTER — Ambulatory Visit (HOSPITAL_COMMUNITY)
Admission: RE | Admit: 2023-09-20 | Discharge: 2023-09-20 | Disposition: A | Discharge status: Home / Self Care | Payer: BC Managed Care – PPO | Source: Ambulatory Visit | Attending: Gynecologic Oncology | Admitting: Gynecologic Oncology

## 2023-09-20 ENCOUNTER — Inpatient Hospital Stay: Payer: BC Managed Care – PPO | Attending: Hematology and Oncology

## 2023-09-20 DIAGNOSIS — L599 Disorder of the skin and subcutaneous tissue related to radiation, unspecified: Secondary | ICD-10-CM

## 2023-09-20 DIAGNOSIS — Z170 Estrogen receptor positive status [ER+]: Secondary | ICD-10-CM | POA: Insufficient documentation

## 2023-09-20 DIAGNOSIS — Z8041 Family history of malignant neoplasm of ovary: Secondary | ICD-10-CM | POA: Insufficient documentation

## 2023-09-20 DIAGNOSIS — C50211 Malignant neoplasm of upper-inner quadrant of right female breast: Secondary | ICD-10-CM | POA: Insufficient documentation

## 2023-09-20 DIAGNOSIS — Z8049 Family history of malignant neoplasm of other genital organs: Secondary | ICD-10-CM | POA: Diagnosis not present

## 2023-09-20 DIAGNOSIS — Z79810 Long term (current) use of selective estrogen receptor modulators (SERMs): Secondary | ICD-10-CM | POA: Insufficient documentation

## 2023-09-20 DIAGNOSIS — Z483 Aftercare following surgery for neoplasm: Secondary | ICD-10-CM

## 2023-09-20 LAB — CBC WITH DIFFERENTIAL (CANCER CENTER ONLY)
Abs Immature Granulocytes: 0.01 10*3/uL (ref 0.00–0.07)
Basophils Absolute: 0 10*3/uL (ref 0.0–0.1)
Basophils Relative: 0 %
Eosinophils Absolute: 0.1 10*3/uL (ref 0.0–0.5)
Eosinophils Relative: 2 %
HCT: 38.4 % (ref 36.0–46.0)
Hemoglobin: 13.6 g/dL (ref 12.0–15.0)
Immature Granulocytes: 0 %
Lymphocytes Relative: 21 %
Lymphs Abs: 1.3 10*3/uL (ref 0.7–4.0)
MCH: 33.1 pg (ref 26.0–34.0)
MCHC: 35.4 g/dL (ref 30.0–36.0)
MCV: 93.4 fL (ref 80.0–100.0)
Monocytes Absolute: 0.4 10*3/uL (ref 0.1–1.0)
Monocytes Relative: 7 %
Neutro Abs: 4.3 10*3/uL (ref 1.7–7.7)
Neutrophils Relative %: 70 %
Platelet Count: 259 10*3/uL (ref 150–400)
RBC: 4.11 MIL/uL (ref 3.87–5.11)
RDW: 13 % (ref 11.5–15.5)
WBC Count: 6.1 10*3/uL (ref 4.0–10.5)
nRBC: 0 % (ref 0.0–0.2)

## 2023-09-20 LAB — CMP (CANCER CENTER ONLY)
ALT: 22 U/L (ref 0–44)
AST: 18 U/L (ref 15–41)
Albumin: 4.5 g/dL (ref 3.5–5.0)
Alkaline Phosphatase: 105 U/L (ref 38–126)
Anion gap: 8 (ref 5–15)
BUN: 16 mg/dL (ref 6–20)
CO2: 30 mmol/L (ref 22–32)
Calcium: 9.5 mg/dL (ref 8.9–10.3)
Chloride: 104 mmol/L (ref 98–111)
Creatinine: 0.51 mg/dL (ref 0.44–1.00)
GFR, Estimated: >60 mL/min (ref >60)
Glucose, Bld: 110 mg/dL — ABNORMAL HIGH (ref 70–99)
Potassium: 3.7 mmol/L (ref 3.5–5.1)
Sodium: 142 mmol/L (ref 135–145)
Total Bilirubin: 0.5 mg/dL (ref <1.2)
Total Protein: 6.7 g/dL (ref 6.5–8.1)

## 2023-09-20 NOTE — Therapy (Signed)
OUTPATIENT PHYSICAL THERAPY  UPPER EXTREMITY ONCOLOGY Treatment  Patient Name: Hannah Woodard MRN: [id number] DOB:09-21-1971, 52 y.o., female Today's Date: 09/20/2023  END OF SESSION:  PT End of Session - 09/14/23 1604     Visit Number 17   Number of Visits 19    Date for PT Re-Evaluation 09/20/23    PT Start Time 1604    PT Stop Time 1702    PT Time Calculation (min) 58 min    Activity Tolerance Patient tolerated treatment well    Behavior During Therapy [facility] for tasks assessed/performed                    Past Medical History:  Diagnosis Date   Breast cancer (HCC)    Family history of ovarian cancer 09/02/2017   Family history of prostate cancer in father 09/02/2017   Family history of uterine cancer 09/02/2017   Plantar fasciitis    Past Surgical History:  Procedure Laterality Date   PORT-A-CATH REMOVAL Left 2020   right lumpectomy, sentinel node biopsy, and bilateral mammoplasty  10/2017   [facility]   Patient Active Problem List   Diagnosis Date Noted   Overweight (BMI 25.0-29.9) 12/31/2020   Hyperlipidemia 12/30/2020   Neuropathy due to chemotherapeutic drug (HCC) 12/31/2017   Port-A-Cath in place 11/12/2017   Genetic testing 09/15/2017   Malignant neoplasm of upper-inner quadrant of right breast in female, estrogen receptor positive (HCC) 08/27/2017   Vitamin D deficiency 12/09/2015   Medication management 12/09/2015   Dense breast tissue 12/09/2015    PCP: Nadyne Coombes, MD  REFERRING PROVIDER: Sula Soda, MD  REFERRING DIAG:  Diagnosis  L90.5 (ICD-10-CM) - Scar tissue    THERAPY DIAG:  Aftercare following surgery for neoplasm  Disorder of the skin and subcutaneous tissue related to radiation, unspecified  ONSET DATE: 2019  Rationale for Evaluation and Treatment: Rehabilitation  SUBJECTIVE:                                                                                                                                                                                            SUBJECTIVE STATEMENT:  I feel ready to be done.  I can do the self cupping.   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 1 negative node removed. Taxol stopped due to neuropathy. Radiation completed.   PAIN:  Are you having pain? Yes NPRS scale: 3/10 Pain location: both lateral breast incisions  Pain orientation: Right and Left  PAIN TYPE: tight Pain description: just with pressing  Aggravating factors: pressing on  the scars  Relieving factors: CBD,good posture, anti inflammatory  PRECAUTIONS: Rt lymphedema risk   RED FLAGS: None   WEIGHT BEARING RESTRICTIONS: No  FALLS:  Has patient fallen in last 6 months? Yes. Number of falls due to CIPN, x 3, bad with uneven ground   LIVING ENVIRONMENT: Lives with: lives with their family and lives with their spouse  OCCUPATION: works with hearing impaired for [employer]   LEISURE: walking dog 0.6 miles per day   HAND DOMINANCE: left   PRIOR LEVEL OF FUNCTION: Independent  PATIENT GOALS: Try and decrease pain   OBJECTIVE:  COGNITION: Overall cognitive status: Within functional limits for tasks assessed   PALPATION: Fat necrosis medial Rt breast, increased scar nodularity medial breasts bil  OBSERVATIONS / OTHER ASSESSMENTS: Lt breast larger than Rt due to radiation on the Rt.    POSTURE: rounded shoulders, forward head  UPPER EXTREMITY AROM/PROM:  A/PROM RIGHT   eval  08/09/23  Shoulder extension 52 50  Shoulder flexion 165 162  Shoulder abduction 162 160  Shoulder internal rotation    Shoulder external rotation 89 - slight pec tightness* / to 80 in supine with shoulder to 90deg of abduction 92    (Blank rows = not tested)  A/PROM LEFT   eval 08/09/23  Shoulder extension 40 40  Shoulder flexion 165 165  Shoulder abduction 165 165  Shoulder internal rotation    Shoulder external  rotation 94 / to 90deg in supine with shoulder 90deg of abduction 94    (Blank rows = not tested)  LYMPHEDEMA ASSESSMENTS:   LANDMARK RIGHT  eval  At axilla    15 cm proximal to olecranon process   10 cm proximal to olecranon process 27.6  Olecranon process 25.2  15 cm proximal to ulnar styloid process   10 cm proximal to ulnar styloid process 21.5  Just proximal to ulnar styloid process 16.5  Across hand at thumb web space 19  At base of 2nd digit 6.0  (Blank rows = not tested)  LANDMARK LEFT  eval  At axilla    15 cm proximal to olecranon process   10 cm proximal to olecranon process 28  Olecranon process 26.0  15 cm proximal to ulnar styloid process   10 cm proximal to ulnar styloid process 21.7  Just proximal to ulnar styloid process 16.6  Across hand at thumb web space 18.5  At base of 2nd digit 6.2  (Blank rows = not tested)  QUICK DASH SURVEY: 28% on eval, 36% limited (pt started square dancing and noticed some limitations)   BREAST COMPLAINTS QUESTIONNAIRE   EVAL  Discharge 09/20/23 Pain:  3   3 Heaviness: 4   4   Swollen feeling:0   0 Tense Skin: 0   0 Redness: 0   0 Bra Print: 0   0 Size of Pores: 0   0 Hard feeling:  3   3 Total:    10 /80    10/80 A Score over 9 indicates lymphedema issues in the breast   TODAY'S TREATMENT:  DATE:  09/20/23 Reviewed goals and final Plan.  Cat-camel x 10 Childs pose mid/Rt/Lt x 3 each  Went through strength ABC program each stretch 1 x 15 sec: bridging x 15, chest press 2# x 15, bicep curls 2# x 15,  row x 15 with red band,Triceps 15 with red band,  scaption with back against wall 3 # x 10,sit to stand from chair without UE x10, heel raises 2# ea x 10, Scar work right breast and axillary incision along border of incision  - smallest cup used x 10 min using dynamic motions along scar and  popping.    09/16/2023 Cat-camel x 10 Childs pose mid/Rt/Lt x 3 each  Went through strength ABC program each stretch 1 x 15 sec: bridging x 15, quadruped alt UE/LE x 3 bil (some Lt knee pain), chest press 2# x 15, bicep curls 2# x 15, 3# x 10  row x 15 with red band,Triceps 15 with red band,   scaption with back against wall 3 # x 10,sit to stand from chair without UE x10, heel raises 2# ea x 10, Scar work right breast and axillary incision along border of incision  - smallest cup used x 10 min using dynamic motions along scar and popping.    09/14/2023 Cat-camel x 10 Childs pose mid/Rt/Lt x 2 each  Went through strength ABC program each stretch 1 x 15 sec: bridging x 10, quadruped alt UE/LE x 3 bil (some Lt knee pain), chest press 2# x 15, bicep curls 2# x 15,  row x 15 with red band,Triceps 15 with red band, biceps curls  2#15 reps,  scaption with back against wall2 # x 10,sit to stand from chair without UE x15,  step and hold no wt x 7 ea heel raises 2# ea x 10, STM to bilateral UT and right pectorals due to complaints of tightness Scar work right breast along border of incision  - smallest cup used x 5 min total using dynamic motions along scar and popping.     PATIENT EDUCATION:  Education details: POC, new stretches Person educated: Patient Education method: Explanation, Demonstration, and Handouts Education comprehension: verbalized understanding  HOME EXERCISE PROGRAM: [id number]   ASSESSMENT: CLINICAL IMPRESSION: Pt is ready for DC.  She has met all of her goals.  She continues with breast and other musculoskeletal pains but feels ready for self care.    OBJECTIVE IMPAIRMENTS: decreased knowledge of condition, decreased ROM, increased edema, and increased fascial restrictions.   ACTIVITY LIMITATIONS: none  PARTICIPATION LIMITATIONS: none  PERSONAL FACTORS: Time since onset of injury/illness/exacerbation and 1 comorbidity: SLNB, radiation  are also affecting patient's  functional outcome.   REHAB POTENTIAL: Good  CLINICAL DECISION MAKING: Stable/uncomplicated  EVALUATION COMPLEXITY: Moderate  GOALS: Goals reviewed with patient? Yes  SHORT TERM GOALS=LTGs: Target date: 09/20/23  Pt will report reaching in the car is improved by at least 50%  Baseline: Goal status: MET  2.  Pt will be ind with self massage for the breast incisions Baseline:  Goal status: MET  3.  Pt will be given information for [employer] to [employer] for bras as needed Baseline:  Goal status: MET   4.  Pt will improve Rt shoulder AROM to 90deg to show improved mobility.  Baseline:  Goal status: MET  5 Pt will be ind with stretches and exercises for continued self care.   NEW   PLAN:  PT FREQUENCY: 1-2x/week  PT DURATION: 4 weeks - 6 weeks added for  timing.   PLANNED INTERVENTIONS: Therapeutic exercises, Therapeutic activity, Patient/Family education, Self Care, Joint mobilization, Manual therapy, and Re-evaluation, DN  PLAN FOR NEXT SESSION: gave pt info about cream below:, try TENS for neuropathy? foam roll, Rt shoulder MFR: pec, lat, PROM, stretching as needed, scar massage education, cupping, taping, DN as needed,   Neuropathy Cream [facility]  Peripheral Neuropathy cream: bupivavaine 1%, Doxepin 3%, Gabapentin 6%, Pentoxifylline 3%, topiramate 1%  Santresa Levett, Julieanne Manson, PT 09/20/2023, 11:05 AM  PHYSICAL THERAPY DISCHARGE SUMMARY  Visits from Start of Care: 17  Current functional level related to goals / functional outcomes: Ready for DC   Remaining deficits: Chronic pain and scar tissue    Education / Equipment: Final HEP, self scar massage.   Plan: Patient agrees to discharge.   Patient is being discharged due to meeting the stated rehab goals.

## 2023-09-27 ENCOUNTER — Encounter: Payer: Self-pay | Admitting: Hematology and Oncology

## 2023-09-28 ENCOUNTER — Telehealth: Payer: Self-pay

## 2023-09-28 NOTE — Telephone Encounter (Signed)
Attempted to reach patient to relay message from Hannah Mccreedy NP regarding recent ultrasound results. Voicemail was full

## 2023-09-28 NOTE — Telephone Encounter (Signed)
-----   Message from Eleanor JONETTA Epps sent at 09/28/2023 12:47 PM EST ----- Please let her know about her US . They note the calcified fibroid that is stable in size. They note calcifications in the ovaries, the largest in the left ovary is stable in size. This report is more detailed than the previous ones. Will have one of our gyn oncologists review as well. ----- Message ----- From: Interface, Rad Results In Sent: 09/25/2023   7:55 AM EST To: Eleanor JONETTA Epps, NP

## 2023-09-30 ENCOUNTER — Encounter: Payer: Self-pay | Admitting: Hematology and Oncology

## 2023-09-30 NOTE — Telephone Encounter (Signed)
 Pt is aware of ultrasound results. She states she had already looked at it and was not really concerned. She will wait for one of the GYN oncologists to review.

## 2023-09-30 NOTE — Telephone Encounter (Signed)
-----   Message from Eleanor JONETTA Epps sent at 09/28/2023 12:47 PM EST ----- Please let her know about her US . They note the calcified fibroid that is stable in size. They note calcifications in the ovaries, the largest in the left ovary is stable in size. This report is more detailed than the previous ones. Will have one of our gyn oncologists review as well. ----- Message ----- From: Interface, Rad Results In Sent: 09/25/2023   7:55 AM EST To: Eleanor JONETTA Epps, NP

## 2023-10-04 ENCOUNTER — Encounter: Payer: Self-pay | Admitting: Hematology and Oncology

## 2023-10-05 ENCOUNTER — Telehealth: Payer: Self-pay

## 2023-10-05 ENCOUNTER — Other Ambulatory Visit: Payer: Self-pay

## 2023-10-05 ENCOUNTER — Inpatient Hospital Stay: Payer: 59 | Attending: Hematology and Oncology

## 2023-10-05 VITALS — BP 130/64 | HR 73 | Temp 98.8°F | Resp 20

## 2023-10-05 DIAGNOSIS — Z5111 Encounter for antineoplastic chemotherapy: Secondary | ICD-10-CM | POA: Insufficient documentation

## 2023-10-05 DIAGNOSIS — Z95828 Presence of other vascular implants and grafts: Secondary | ICD-10-CM

## 2023-10-05 DIAGNOSIS — Z170 Estrogen receptor positive status [ER+]: Secondary | ICD-10-CM | POA: Diagnosis not present

## 2023-10-05 DIAGNOSIS — C50211 Malignant neoplasm of upper-inner quadrant of right female breast: Secondary | ICD-10-CM | POA: Diagnosis not present

## 2023-10-05 MED ORDER — GOSERELIN ACETATE 10.8 MG ~~LOC~~ IMPL
10.8000 mg | DRUG_IMPLANT | Freq: Once | SUBCUTANEOUS | Status: AC
  Administered 2023-10-05: 10.8 mg via SUBCUTANEOUS
  Filled 2023-10-05: qty 10.8

## 2023-10-05 NOTE — Telephone Encounter (Signed)
-----   Message from Doylene Bode sent at 10/05/2023  9:50 AM EST ----- Please let pt know: I reviewed her [place] with Dr. Pricilla Holm, GYN ONC. She recommends proceeding with the current plan. No recommendations for her end.

## 2023-10-05 NOTE — Telephone Encounter (Signed)
 Pt is aware of message from Hannah Mccreedy NP regarding ultrasound. She was thankful for the call

## 2023-11-30 ENCOUNTER — Encounter: Payer: Self-pay | Admitting: Physical Medicine and Rehabilitation

## 2023-11-30 ENCOUNTER — Encounter: Payer: 59 | Attending: Physical Medicine and Rehabilitation | Admitting: Physical Medicine and Rehabilitation

## 2023-11-30 VITALS — BP 126/86 | HR 79 | Ht 65.5 in | Wt 163.0 lb

## 2023-11-30 DIAGNOSIS — T451X5A Adverse effect of antineoplastic and immunosuppressive drugs, initial encounter: Secondary | ICD-10-CM | POA: Diagnosis present

## 2023-11-30 DIAGNOSIS — G620 Drug-induced polyneuropathy: Secondary | ICD-10-CM | POA: Diagnosis not present

## 2023-11-30 DIAGNOSIS — M79644 Pain in right finger(s): Secondary | ICD-10-CM | POA: Insufficient documentation

## 2023-11-30 DIAGNOSIS — G4701 Insomnia due to medical condition: Secondary | ICD-10-CM | POA: Diagnosis present

## 2023-11-30 DIAGNOSIS — E663 Overweight: Secondary | ICD-10-CM | POA: Diagnosis present

## 2023-11-30 DIAGNOSIS — G8929 Other chronic pain: Secondary | ICD-10-CM | POA: Insufficient documentation

## 2023-11-30 DIAGNOSIS — M7918 Myalgia, other site: Secondary | ICD-10-CM | POA: Diagnosis not present

## 2023-11-30 NOTE — Progress Notes (Addendum)
 Subjective:    Patient ID: Hannah Woodard, female    DOB: 02/10/1971, 53 y.o.   MRN: [id number]  HPI  Hannah Woodard is a 53 year old woman who presents for f/u of athralgias secondary to chemotherapy.   1) Chemotherapy induced athralgias -had a stumble since last visit -she has been walking a lot -neuropathy has been acting up -discussed that her DEXA showed a reduction of her bone density but not to osteopenic levels -she uses CBD oil with menthol and capsaicin and ice socks but these are sometimes too cold for her. Has not needed this as often -got 12 weeks relief from Qutenza after the first two applications, but she was not sure if it was efficacious last time.  -she continues to take gabapentin -her dog ate one of her orthotics and this worsened her pain recently -she continues to take liposomal vitamin C and 10,000U vitamin D per day.  -most pain was in the ball of the feet and big toes.  -she felt most benefit from the first Qutenza application, she felt less benefit from the last one. She has some burning the day of application but no uncomfortable burning afterward. After the more recent applications she has had a few days of burning.  -She thinks her pain may have been a little worse because she has been active.  -she asks whether she can go for a walk today, she got a new puppy -she used oral CBD with chem -Initially pain about 8 at worst in the mornings, now down to 5/10.  -she feels traditional medicine is still not open to herbal medicine -the neuropathy started with breast cancer chemotherapy.  -pain right now is 4/10 -pain is intermittent.  -has never tried Pre-Dx -she was on Taxol and Herceptin.  -she hates that she is on any drugs -she thinks she eats healthy and she takes vitamins -pain is mostly present on dorsum of both feet, as well as balls and heels. -charlie horses improved -she gets liposomal vitamin C -she benefited from the exercises given to her by  PT -irritated right hip bursa using the foam roller -worse some tight jeans to church and developed a rash.  -could not abduct hip for about a week.  -she is nervous about the foam roller -was bending leg back and forth for several weeks and then started feeling pain on the medial aspect of big toe  2) High iron level -she was told not to take iron supplement since her level was too high.   3) Hot flashes -continues to take gabapentin for these -not as bad -helps to stay away from caffeine, spicy foods, chocolate -she would like to wean off the gabapentin  4) Rash -she easily gets rashes   5) Poison Ivy: -she recently got while gardening.   6) Peripheral neuropathy: -she cannot walk on the hot sands -she is interested in trying Dotetta  7) Bunion: -she would like referral to podiatry  8) Scar tissue: -breasts are sore from scar tissue  9) Overweight: -she and her colleagues are doing a Noom app together and this has motivated her to walk more   Pain Inventory Average Pain 3 Pain Right Now 5 My pain is intermittent, constant, sharp, and burning  LOCATION OF PAIN  feet,toes,leg,fingers,back,hips,neck  BOWEL Number of stools per week: 14 Oral laxative use fiber powder Type of laxative  Enema or suppository use No  History of colostomy No  Incontinent No   BLADDER Normal  Mobility walk without assistance ability to climb steps?  yes do you drive?  yes  Function employed # of hrs/week 37.5  School System  Neuro/Psych numbness tingling  Prior Studies N/a  Physicians involved in your care N/a   Family History  Problem Relation Age of Onset   Ovarian cancer Mother 59   Prostate cancer Father 25       'high gleason' unsure number   Ulcerative colitis Sister    Other Sister        abn ovaian growth, partial hysterectomy   Basal cell carcinoma Sister 42   Uterine cancer Maternal Aunt 55   Stroke Maternal Aunt 66   Basal cell carcinoma  Brother    Skin cancer Paternal Uncle    Skin cancer Maternal Grandmother    Stroke Paternal Grandfather 21   Hydrocephalus Cousin    Social History   Socioeconomic History   Marital status: Married    Spouse name: Not on file   Number of children: Not on file   Years of education: Not on file   Highest education level: Not on file  Occupational History   Occupation: Transport planner  Tobacco Use   Smoking status: Never   Smokeless tobacco: Never  Vaping Use   Vaping status: Never Used  Substance and Sexual Activity   Alcohol use: Not Currently    Comment: rarely    Drug use: No   Sexual activity: Yes    Partners: Male    Birth control/protection: Other-see comments    Comment: chemical menopaus  Other Topics Concern   Not on file  Social History Narrative   Not on file   Social Drivers of Health   Financial Resource Strain: Not on file  Food Insecurity: Not on file  Transportation Needs: Not on file  Physical Activity: Not on file  Stress: Not on file  Social Connections: Not on file   Past Surgical History:  Procedure Laterality Date   PORT-A-CATH REMOVAL Left 2020   right lumpectomy, sentinel node biopsy, and bilateral mammoplasty  10/2017   [facility]   Past Medical History:  Diagnosis Date   Breast cancer (HCC)    Family history of ovarian cancer 09/02/2017   Family history of prostate cancer in father 09/02/2017   Family history of uterine cancer 09/02/2017   Plantar fasciitis    LMP 10/05/2017   Opioid Risk Score:   Fall Risk Score:  `1  Depression screen PHQ 2/9     06/07/2023    2:42 PM 03/30/2023   10:17 AM 03/02/2023    2:55 PM 09/07/2022    9:29 AM 12/09/2021    2:05 PM 09/02/2021    3:09 PM 08/26/2021    9:51 AM  Depression screen PHQ 2/9  Decreased Interest 0 0 0 0 0 0 0  Down, Depressed, Hopeless 0 0 0 0 0 0 1  PHQ - 2 Score 0 0 0 0 0 0 1  Altered sleeping       2  Tired, decreased energy       2  Change in appetite       0  Feeling  bad or failure about yourself        1  Trouble concentrating       0  Moving slowly or fidgety/restless       0  Suicidal thoughts       0  PHQ-9 Score       6  Review of Systems  Constitutional:  Positive for unexpected weight change. Negative for chills and fever.  HENT: Negative.    Eyes: Negative.   Respiratory: Negative.    Cardiovascular: Negative.  Negative for chest pain.  Endocrine: Negative.   Genitourinary: Negative.   Musculoskeletal:  Positive for back pain.  Skin: Negative.   Allergic/Immunologic: Negative.   Neurological: Negative.   Hematological: Negative.   Psychiatric/Behavioral: Negative.         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 IFC, NMES and TENS medically necessary Treatment Rx: Daily @ 30-40 minutes per treatment PRN. Zynex NexWave only, no substitutions. Treatment Goals: 1) To reduce and/or eliminate pain 2) To improve functional capacity and Activities of daily living 3) To reduce or prevent the need for oral medications 4) To improve circulation in the injured region 5) To decrease or prevent muscle spasm and muscle atrophy 6) To provide a self-management tool  to the patient The patient has not sufficiently improved with conservative care. Numerous studies indexed by Medline and PubMed.gov have shown Neuromuscular, Interferential, and TENS stimulators to reduce pain, improve function, and reduce medication use in injured patients. Continued use of this evidence based, safe, drug free treatment is both reasonable and medically necessary at this time.   -discussed Doterra essential oils  -Discussed current symptoms of pain and history of pain.  -Discussed benefits of exercise in reducing pain. -continue CBD oil -commended on trying ashwagandha -will not repeat Qutenza since did not help last time  -discussed topamax and its possible side effect of bone loss  -Discussed current symptoms of pain and history of pain.  -Discussed benefits of exercise in reducing pain. -Discussed following foods that may reduce pain: 1) Ginger (especially studied for arthritis)- reduce leukotriene production to decrease inflammation 2) Blueberries- high in phytonutrients that decrease inflammation 3) Salmon- marine omega-3s reduce joint swelling and pain 4) Pumpkin seeds- reduce inflammation 5) dark chocolate- reduces inflammation 6) turmeric- reduces inflammation 7) tart cherries - reduce pain and stiffness 8) extra virgin olive oil - its compound olecanthal helps to block prostaglandins  9) chili peppers- can be eaten or applied topically via capsaicin 10) mint- helpful for headache, muscle aches, joint pain, and itching 11) garlic- reduces inflammation  Link to further information on diet for chronic pain: [email or web address].bray.[email or web address]   2) Hot flashes: -continue gabapentin PRN -discussed that she is on such a low dose that she does not need to wean off.   3) Overweight: -discussed that she has been walking more after using the Noom app -discussed risks and benefits of calorie  restriction -discussed that quality of food I believe is more important than calories -Educated that current weight is 160, commended on losing 10  lbs since I met her -encouraged protein -discussed that decreasing weight can help decrease weight on joints, and thus lessen pain.  -Educated regarding health benefits of weight loss- for pain, general health, chronic disease prevention, immune health, mental health.  -encouraged higher protein, lower bread.  -Educated regarding health benefits of weight loss- for pain, general health, chronic disease prevention, immune health, mental health.  -Will monitor weight every visit.  -Consider Roobois tea daily.  -Discussed the benefits of intermittent fasting. -Discussed foods that can assist in weight loss: 1) leafy greens- high in fiber and nutrients 2) dark chocolate- improves metabolism (if prefer sweetened, best to sweeten with honey instead of sugar).  3) cruciferous vegetables- high in fiber and protein 4) full fat yogurt: high in healthy fat, protein, calcium, and probiotics 5) apples- high in a variety of phytochemicals 6) nuts- high in fiber and protein that increase feelings of fullness 7) grapefruit: rich in nutrients, antioxidants, and fiber (not to be taken with anticoagulation) 8) beans- high in protein and fiber 9) salmon- has high quality protein and healthy fats 10) green tea- rich in polyphenols 11) eggs- rich in choline and vitamin D 12) tuna- high protein, boosts metabolism 13) avocado- decreases visceral abdominal fat 14) chicken (pasture raised): high in protein and iron 15) blueberries- reduce abdominal fat and cholesterol 16) whole grains- decreases calories retained during digestion, speeds metabolism 17) chia seeds- curb appetite 18) chilies- increases fat metabolism  -Discussed supplements that can be used:  1) Metatrim 400mg  BID 30 minutes before breakfast and dinner  2) Sphaeranthus indicus and Garcinia mangostana  (combinations of these and #1 can be found in capsicum and zychrome  3) green coffee bean extract 400mg  twice per day or Irvingia (african mango) 150 to 300mg  twice per day.   4) Athralgias:  -discussed response to therapy -commended on being able to walk 1 hour and a half! -discussed the results of her recent DEXA scan  5) Pain on bilateral big toes: -discussed toe spacing socks -discussed using wide toe box -discussed that she has formation of mild bunion -continue insoles  6) Insomnia: -continue gabapentin wean -discussed that getting the sunlight exposure during the morning helps people fall asleep faster -continue pregnancy pillow -discussed herbal teas, tart cherry juice, pistachios -discussed that she is a left side sleeper -discussed her challenges in finding the right pillow.  -Try to go outside near sunrise -Get exercise during the day.  -Turn off all devices an hour before bedtime.  -Teas that can benefit: chamomile, valerian root, Brahmi (Bacopa) -Can consider over the counter melatonin, magnesium, and/or L-theanine. Melatonin is an anti-oxidant with multiple health benefits. Magnesium is involved in greater than 300 enzymatic reactions in the body and most of [place] are deficient as our soil is often depleted. There are 7 different types of magnesium- Bioptemizer's is a supplement with all 7 types, and each has unique benefits. Magnesium can also help with constipation and anxiety.  -Pistachios naturally increase the production of melatonin -Cozy Earth bamboo bed sheets are free from toxic chemicals.  -Tart cherry juice or a tart cherry supplement can improve sleep and soreness post-workout   7) Protracted posture -discussed exercises for postural correction -continue PT -discussed the role that foot abnormalities can play in contributing to postural abnormalities -discussed that dry needling helped  8) HTN: -continue magnesium supplement -Advised checking BP daily at  home and logging results to bring into follow-up appointment with PCP and myself. -Reviewed BP meds today.  -Advised  regarding healthy foods that can help lower blood pressure and provided with a list: 1) citrus foods- high in vitamins and minerals 2) salmon and other fatty fish - reduces inflammation and oxylipins 3) swiss chard (leafy green)- high level of nitrates 4) pumpkin seeds- one of the best natural sources of magnesium 5) Beans and lentils- high in fiber, magnesium, and potassium 6) Berries- high in flavonoids 7) Amaranth (whole grain, can be cooked similarly to rice and oats)- high in magnesium and fiber 8) Pistachios- even more effective at reducing BP than other nuts 9) Carrots- high in phenolic compounds that relax blood vessels and reduce inflammation 10) Celery- contain phthalides that relax tissues of arterial walls 11) Tomatoes- can also improve cholesterol and reduce risk of heart disease 12) Broccoli- good source of magnesium, calcium, and potassium 13) Greek yogurt: high in potassium and calcium 14) Herbs and spices: Celery seed, cilantro, saffron, lemongrass, black cumin, ginseng, cinnamon, cardamom, sweet basil, and ginger 15) Chia and flax seeds- also help to lower cholesterol and blood sugar 16) Beets- high levels of nitrates that relax blood vessels  17) spinach and bananas- high in potassium  -Provided lise of supplements that can help with hypertension:  1) magnesium: one high quality brand is Bioptemizers since it contains all 7 types of magnesium, otherwise over the counter magnesium gluconate 400mg  is a good option 2) B vitamins 3) vitamin D 4) potassium 5) CoQ10 6) L-arginine 7) Vitamin C 8) Beetroot -Educated that goal BP is 120/80. -Made goal to incorporate some of the above foods into diet.     7) Bunion: -discussed loosening of her laces.  -referred to podiatry  8) Pelvic pain: Continue pelvic floor pain  9) Fibroids: -continue regular  scans  10) History of cancer: -discussed her concerns with Tamoxifen, discussed that her recent DEXA scan was normal  11) Right knee pain s/p fall: -discussed fall and her subsequent knee pain  12) Scar tissue/lymphedema: -referred to cancer rehab for lymphatic drainage/massage  13) Right thumb pain:  -continue CBD oil -discussed that she has a sharp pain -discussed that typing aggravates her pain  14) Thoracic/cervical myofascial pain Discussed that she had a trigger point injection in the past -Discussed current symptoms of pain and history of pain.  -Discussed benefits of exercise in reducing pain. -Discussed following foods that may reduce pain: 1) Ginger (especially studied for arthritis)- reduce leukotriene production to decrease inflammation 2) Blueberries- high in phytonutrients that decrease inflammation 3) Salmon- marine omega-3s reduce joint swelling and pain 4) Pumpkin seeds- reduce inflammation 5) dark chocolate- reduces inflammation 6) turmeric- reduces inflammation 7) tart cherries - reduce pain and stiffness 8) extra virgin olive oil - its compound olecanthal helps to block prostaglandins  9) chili peppers- can be eaten or applied topically via capsaicin 10) mint- helpful for headache, muscle aches, joint pain, and itching 11) garlic- reduces inflammation  Link to further information on diet for chronic pain: [email or web address].bray.[email or web address]   40 minutes spent in discussion of her pain, insomnia, her new walking app, her gabapentin wean, sunlight exposure during the day, 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, 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

## 2023-11-30 NOTE — Patient Instructions (Addendum)
 Foods that may reduce pain: 1) Ginger (especially studied for arthritis)- reduce leukotriene production to decrease inflammation 2) Blueberries- high in phytonutrients that decrease inflammation 3) Salmon- marine omega-3s reduce joint swelling and pain 4) Pumpkin seeds- reduce inflammation 5) dark chocolate- reduces inflammation 6) turmeric- reduces inflammation 7) tart cherries - reduce pain and stiffness 8) extra virgin olive oil - its compound olecanthal helps to block prostaglandins  9) chili peppers- can be eaten or applied topically via capsaicin 10) mint- helpful for headache, muscle aches, joint pain, and itching 11) garlic- reduces inflammation  Link to further information on diet for chronic pain: [email or web address].bray.com/   Foods for weight loss:  -Educated regarding health benefits of weight loss- for pain, general health, chronic disease prevention, immune health, mental health.  -Will monitor weight every visit.  -Consider Roobois tea daily.  -Discussed the benefits of intermittent fasting. -Discussed foods that can assist in weight loss: 1) leafy greens- high in fiber and nutrients 2) dark chocolate- improves metabolism (if prefer sweetened, best to sweeten with honey instead of sugar).  3) cruciferous vegetables- high in fiber and protein 4) full fat yogurt: high in healthy fat, protein, calcium, and probiotics 5) apples- high in a variety of phytochemicals 6) nuts- high in fiber and protein that increase feelings of fullness 7) grapefruit: rich in nutrients, antioxidants, and fiber (not to be taken with anticoagulation) 8) beans- high in protein and fiber 9) salmon- has high quality protein and healthy fats 10) green tea- rich in polyphenols 11) eggs- rich in choline and vitamin D 12) tuna- high protein, boosts metabolism 13) avocado- decreases visceral abdominal fat 14) chicken (pasture raised): high  in protein and iron 15) blueberries- reduce abdominal fat and cholesterol 16) whole grains- decreases calories retained during digestion, speeds metabolism 17) chia seeds- curb appetite 18) chilies- increases fat metabolism  -Discussed supplements that can be used:  1) Metatrim 400mg  BID 30 minutes before breakfast and dinner  2) Sphaeranthus indicus and Garcinia mangostana (combinations of these and #1 can be found in capsicum and zychrome  3) green coffee bean extract 400mg  twice per day or Irvingia (african mango) 150 to 300mg  twice per day.

## 2023-11-30 NOTE — Addendum Note (Signed)
 Addended by: Horton Chin on: 11/30/2023 03:32 PM   Modules accepted: Level of Service

## 2023-12-21 ENCOUNTER — Encounter (HOSPITAL_BASED_OUTPATIENT_CLINIC_OR_DEPARTMENT_OTHER): Admitting: Physical Medicine and Rehabilitation

## 2023-12-21 ENCOUNTER — Encounter: Payer: Self-pay | Admitting: Physical Medicine and Rehabilitation

## 2023-12-21 VITALS — BP 114/81 | HR 76 | Ht 65.5 in | Wt 168.0 lb

## 2023-12-21 DIAGNOSIS — M7918 Myalgia, other site: Secondary | ICD-10-CM | POA: Diagnosis not present

## 2023-12-21 DIAGNOSIS — G620 Drug-induced polyneuropathy: Secondary | ICD-10-CM | POA: Diagnosis not present

## 2023-12-21 MED ORDER — LIDOCAINE HCL 1 % IJ SOLN
5.0000 mL | Freq: Once | INTRAMUSCULAR | Status: AC
  Administered 2023-12-21: 5 mL via INTRADERMAL

## 2023-12-22 NOTE — Progress Notes (Signed)

## 2023-12-28 ENCOUNTER — Inpatient Hospital Stay: Payer: BC Managed Care – PPO | Attending: Hematology and Oncology

## 2023-12-28 VITALS — BP 137/90 | HR 76 | Temp 97.6°F | Resp 20

## 2023-12-28 DIAGNOSIS — Z95828 Presence of other vascular implants and grafts: Secondary | ICD-10-CM

## 2023-12-28 DIAGNOSIS — Z170 Estrogen receptor positive status [ER+]: Secondary | ICD-10-CM | POA: Insufficient documentation

## 2023-12-28 DIAGNOSIS — Z1721 Progesterone receptor positive status: Secondary | ICD-10-CM | POA: Diagnosis not present

## 2023-12-28 DIAGNOSIS — Z79811 Long term (current) use of aromatase inhibitors: Secondary | ICD-10-CM | POA: Insufficient documentation

## 2023-12-28 DIAGNOSIS — C50211 Malignant neoplasm of upper-inner quadrant of right female breast: Secondary | ICD-10-CM | POA: Diagnosis not present

## 2023-12-28 DIAGNOSIS — Z5111 Encounter for antineoplastic chemotherapy: Secondary | ICD-10-CM | POA: Diagnosis present

## 2023-12-28 DIAGNOSIS — Z1731 Human epidermal growth factor receptor 2 positive status: Secondary | ICD-10-CM | POA: Diagnosis not present

## 2023-12-28 MED ORDER — GOSERELIN ACETATE 10.8 MG ~~LOC~~ IMPL
10.8000 mg | DRUG_IMPLANT | Freq: Once | SUBCUTANEOUS | Status: AC
  Administered 2023-12-28: 10.8 mg via SUBCUTANEOUS
  Filled 2023-12-28: qty 10.8

## 2024-03-21 ENCOUNTER — Inpatient Hospital Stay: Payer: BC Managed Care – PPO | Attending: Hematology and Oncology

## 2024-03-21 VITALS — BP 150/82 | HR 78 | Resp 18

## 2024-03-21 DIAGNOSIS — Z170 Estrogen receptor positive status [ER+]: Secondary | ICD-10-CM | POA: Insufficient documentation

## 2024-03-21 DIAGNOSIS — Z1721 Progesterone receptor positive status: Secondary | ICD-10-CM | POA: Diagnosis not present

## 2024-03-21 DIAGNOSIS — Z1731 Human epidermal growth factor receptor 2 positive status: Secondary | ICD-10-CM | POA: Insufficient documentation

## 2024-03-21 DIAGNOSIS — C50211 Malignant neoplasm of upper-inner quadrant of right female breast: Secondary | ICD-10-CM | POA: Insufficient documentation

## 2024-03-21 DIAGNOSIS — Z5111 Encounter for antineoplastic chemotherapy: Secondary | ICD-10-CM | POA: Diagnosis present

## 2024-03-21 DIAGNOSIS — Z95828 Presence of other vascular implants and grafts: Secondary | ICD-10-CM

## 2024-03-21 MED ORDER — GOSERELIN ACETATE 10.8 MG ~~LOC~~ IMPL
10.8000 mg | DRUG_IMPLANT | Freq: Once | SUBCUTANEOUS | Status: AC
  Administered 2024-03-21: 10.8 mg via SUBCUTANEOUS
  Filled 2024-03-21: qty 10.8

## 2024-03-24 ENCOUNTER — Encounter: Attending: Physical Medicine and Rehabilitation | Admitting: Physical Medicine and Rehabilitation

## 2024-03-24 ENCOUNTER — Encounter: Payer: Self-pay | Admitting: Physical Medicine and Rehabilitation

## 2024-03-24 VITALS — BP 124/66 | HR 76 | Ht 65.5 in | Wt 168.0 lb

## 2024-03-24 DIAGNOSIS — M7918 Myalgia, other site: Secondary | ICD-10-CM | POA: Insufficient documentation

## 2024-03-24 MED ORDER — LIDOCAINE HCL 1 % IJ SOLN
2.0000 mL | Freq: Once | INTRAMUSCULAR | Status: AC
  Administered 2024-03-24: 2 mL

## 2024-03-24 MED ORDER — JOURNAVX 50 MG PO TABS: 1.0000 | ORAL_TABLET | Freq: Two times a day (BID) | ORAL | 0 refills | Status: AC | PRN

## 2024-03-24 NOTE — Addendum Note (Signed)
 Addended by: LORILEE SVEN SQUIBB on: 03/24/2024 10:28 AM   Modules accepted: Orders

## 2024-03-24 NOTE — Progress Notes (Signed)

## 2024-03-29 ENCOUNTER — Other Ambulatory Visit: Payer: Self-pay | Admitting: Gynecologic Oncology

## 2024-03-29 ENCOUNTER — Telehealth: Payer: Self-pay | Admitting: *Deleted

## 2024-03-29 DIAGNOSIS — Z170 Estrogen receptor positive status [ER+]: Secondary | ICD-10-CM

## 2024-03-29 DIAGNOSIS — Z8041 Family history of malignant neoplasm of ovary: Secondary | ICD-10-CM

## 2024-03-29 NOTE — Telephone Encounter (Signed)
 Spoke with Hannah Woodard and relay to patient that her pelvic ultrasound has been ordered and scheduled for Monday, July 7th at 4pm with arrival time of 3:45 with full bladder at [facility] and patient's lab appt. For CA 125 is scheduled for Monday, July 7 th at 3:00 pm and appt. With Eleanor Epps, NP has been moved to Tuesday, July 14 th at 3 pm. Pt agreed to date and times of appointments and thanked the office for calling.

## 2024-03-29 NOTE — Telephone Encounter (Signed)
 Spoke with patient who called the office stating she has a pelvic ultrasound and CA 125 every 6 months and needs to schedule?  Patient last seen by Eleanor Epps, NP  June of 2024. Pt scheduled for an annual follow up with Eleanor Epps, NP for Tuesday, July 8 th at 2 pm. Pt asking if she will need ultrasound and CA 125 done prior to her appt. With NP? Advised patient her message will be relayed to provider and the office will call back with recommendations. Pt verbalized understanding and thanked the office for calling.

## 2024-04-03 ENCOUNTER — Ambulatory Visit (HOSPITAL_COMMUNITY)
Admission: RE | Admit: 2024-04-03 | Discharge: 2024-04-03 | Disposition: A | Discharge status: Home / Self Care | Source: Ambulatory Visit | Attending: Gynecologic Oncology | Admitting: Gynecologic Oncology

## 2024-04-03 ENCOUNTER — Inpatient Hospital Stay: Attending: Hematology and Oncology

## 2024-04-03 DIAGNOSIS — C50211 Malignant neoplasm of upper-inner quadrant of right female breast: Secondary | ICD-10-CM | POA: Diagnosis present

## 2024-04-03 DIAGNOSIS — Z923 Personal history of irradiation: Secondary | ICD-10-CM | POA: Insufficient documentation

## 2024-04-03 DIAGNOSIS — Z9221 Personal history of antineoplastic chemotherapy: Secondary | ICD-10-CM | POA: Insufficient documentation

## 2024-04-03 DIAGNOSIS — Z170 Estrogen receptor positive status [ER+]: Secondary | ICD-10-CM | POA: Insufficient documentation

## 2024-04-03 DIAGNOSIS — Z79818 Long term (current) use of other agents affecting estrogen receptors and estrogen levels: Secondary | ICD-10-CM | POA: Insufficient documentation

## 2024-04-03 DIAGNOSIS — N952 Postmenopausal atrophic vaginitis: Secondary | ICD-10-CM | POA: Insufficient documentation

## 2024-04-03 DIAGNOSIS — Z8041 Family history of malignant neoplasm of ovary: Secondary | ICD-10-CM | POA: Insufficient documentation

## 2024-04-03 DIAGNOSIS — Z8049 Family history of malignant neoplasm of other genital organs: Secondary | ICD-10-CM | POA: Insufficient documentation

## 2024-04-03 DIAGNOSIS — E8941 Symptomatic postprocedural ovarian failure: Secondary | ICD-10-CM | POA: Insufficient documentation

## 2024-04-03 DIAGNOSIS — Z79811 Long term (current) use of aromatase inhibitors: Secondary | ICD-10-CM | POA: Insufficient documentation

## 2024-04-03 DIAGNOSIS — Z8042 Family history of malignant neoplasm of prostate: Secondary | ICD-10-CM | POA: Insufficient documentation

## 2024-04-04 ENCOUNTER — Ambulatory Visit: Admitting: Gynecologic Oncology

## 2024-04-04 LAB — CA 125: Cancer Antigen (CA) 125: 17.3 U/mL (ref 0.0–38.1)

## 2024-04-07 NOTE — Progress Notes (Unsigned)
 Gynecologic Oncology Follow-up Note  Date: 04/11/2024  Chief Complaint:  Chief Complaint  Patient presents with   Malignant neoplasm of upper-inner quadrant of right breast    Family history of ovarian cancer   Consult was initially requested by Dr. Layla for the evaluation of Hannah Woodard 53 y.o. female  Assessment/Plan: Hannah Woodard is a 53 y.o. female with a personal history of triple positive breast cancer at age 14 and family history of ovarian cancer, BRCA negative on genetic testing. No concerning findings on today's exam. Recent CA 125 and US  reviewed.  We will continue with transvaginal ultrasound scans and Ca 125 lab draws at 6 monthly intervals and continue 12 monthly in-person evaluations unless patient decides for surgical intervention at any point. At this time, she would like to avoid surgery and keep her gynecologic organs.   Reportable signs and symptoms reviewed. She is advised to call for any needs or concerns or new symptoms.   HPI: Hannah Woodard is a 53 year old female P0 who was seen in consultation at the request of Dr Layla for an increased risk of ovarian cancer.   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  This was treated with surgery radiation and chemotherapy.  She also received trastuzumab , and then goserelin injections for ovarian suppression and anastrozole .  She has remained disease-free since her initial diagnosis.  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  She reported a family history of a mother who had ovarian cancer in her early 89s.  The patient has received genetic testing for BRCA and HRD and this was negative.  Her sibling had her ovaries removed for prophylactic reasons with no malignancy identified.    The patient is nulliparous but desired future pregnancy at initial consultation.  She sought consultation with [facility] reproductive endocrinology.   They felt that she is not a good candidate for egg donation due to her advanced maternal age, however with donation of either an embryo or an egg donor and insemination she might be able to conceive through IVF.  She chose to keep her options open regarding future pregnancy and declined oophorectomy at that time.  She continued to have menopausal symptoms while on goserelin injections.  She uses [place] and coconut oil and other lubricants for intercourse and use of a vaginal dilator for her genital atrophy symptoms. After consultation, she elected for close monitoring until closer to age 74 or natural menopause.   She underwent a transvaginal ultrasound for screening in June 2020 which revealed a prominent right ovary.  It was repeated on June 02, 2019 and showed bilaterally normal ovaries and uterus.  Ca 125 on 03/19/2022 at 16.7. Ca 125 on 09/18/2022 at 15.1. CA 125 on 03/08/2023 at 16.0.  Last pap smear obtained on 04/14/22 with negative results and HPV high risk not detected.  Interval Hx:  She presents today to the office for continued follow-up.  She reports overall doing well since her last visit.  She continues to tolerate her diet with no nausea or emesis.  No abdominal pain reported. Bladder functioning without difficulty.  No vaginal bleeding or discharge reported. Uses a lubricant made from avocado oil several times a day to help with vaginal lubrication/moisture. Bowels are functioning with intermittent changes related to what she eats.  She is going to plan on having a Cologuard soon.  She typically has a routine where she drinks tea with  collagen and has not been doing this recently.    She does report having intermittent bilateral foot pain/neuropathy symptoms.  She weaned herself off the gabapentin  and is trying more natural measures.  She still has symptoms intermittently. Continues to see her hematologist/oncologist.  Plans for completion of Arimidex  and Zoladex  therapy next year.   She has questions about her mild increase in her recent Ca 125.  She has been trying to walk more and increase her activity.  She is being mindful of things she eats and tries to stay hydrated.  She has a trip planned for [place] next year.  She would like to keep all of her female parts and not have surgery to remove these if possible.    Current Meds:  Outpatient Encounter Medications as of 04/11/2024  Medication Sig   anastrozole  (ARIMIDEX ) 1 MG tablet Take 1 tablet (1 mg total) by mouth daily.   ascorbic acid (VITAMIN C) 1000 MG tablet Take by mouth.   b complex vitamins tablet Take 1 tablet by mouth daily.   Bioflavonoid Products (QUERCETIN COMPLEX IMMUNE PO) Take by mouth. W/ bromelain   Cholecalciferol (VITAMIN D3) 5000 units TABS Take 10,000 Units by mouth daily.   COLLAGEN PO Take by mouth daily. Takes beef collagen   desoximetasone (TOPICORT) 0.25 % cream Apply topically daily as needed.   fish oil-omega-3 fatty acids 1000 MG capsule Take 1 g by mouth daily.   glucosamine-chondroitin 500-400 MG tablet Take 3 tablets by mouth daily.   Green Tea 200 MG CAPS Take 1 capsule by mouth daily in the afternoon.   lactobacillus acidophilus (BACID) TABS tablet Take 2 tablets by mouth 3 (three) times daily.   Magnesium Citrate POWD Take by mouth daily.   Magnesium Glycinate 100 MG CAPS Take 200 mg by mouth 2 (two) times daily.   meloxicam  (MOBIC ) 15 MG tablet TAKE 1 TABLET (15 MG TOTAL) BY MOUTH DAILY. (Patient not taking: Reported on 03/29/2024)   Menaquinone-7 (VITAMIN K2 PO) Take 150 mcg by mouth daily.   montelukast (SINGULAIR) 10 MG tablet TAKE 1 TABLET (10 MG) BY MOUTH EVERY EVENING   senna (SENOKOT) 8.6 MG tablet Take 1 tablet by mouth at bedtime as needed for constipation.   Suzetrigine  (JOURNAVX ) 50 MG TABS Take 1 tablet by mouth 2 (two) times daily as needed.   triamcinolone  ointment (KENALOG ) 0.1 % Apply 1 application topically 2 (two) times daily.   TURMERIC PO Take 1 capsule by mouth as  directed.    UNABLE TO FIND Take 500 mg by mouth daily. [place] Tail mushroom   UNABLE TO FIND Take 500 mg by mouth daily. [place] Mushroom   [DISCONTINUED] prochlorperazine  (COMPAZINE ) 10 MG tablet Take 1 tablet (10 mg total) by mouth every 6 (six) hours as needed (Nausea or vomiting).   Facility-Administered Encounter Medications as of 04/11/2024  Medication   sodium chloride  (PF) 0.9 % injection 2 mL    Allergy:  Allergies  Allergen Reactions   Lactose Diarrhea   Poison Ivy Extract Hives, Itching and Rash   Sulfamethoxazole  Rash   Sulfamethoxazole -Trimethoprim  Rash   Adhesive [Tape]    Milk-Related Compounds Diarrhea    Stomach cramping   Other Rash and Other (See Comments)    Frangrance    Social Hx:   Social History   Socioeconomic History   Marital status: Married    Spouse name: Not on file   Number of children: Not on file   Years of education: Not on file  Highest education level: Not on file  Occupational History   Occupation: language facilitator  Tobacco Use   Smoking status: Never   Smokeless tobacco: Never  Vaping Use   Vaping status: Never Used  Substance and Sexual Activity   Alcohol use: Not Currently    Comment: rarely    Drug use: No   Sexual activity: Yes    Partners: Male    Birth control/protection: Other-see comments    Comment: chemical menopaus  Other Topics Concern   Not on file  Social History Narrative   Not on file   Social Drivers of Health   Financial Resource Strain: Not on file  Food Insecurity: Not on file  Transportation Needs: Not on file  Physical Activity: Not on file  Stress: Not on file  Social Connections: Not on file  Intimate Partner Violence: Not on file    Past Surgical Hx:  Past Surgical History:  Procedure Laterality Date   PORT-A-CATH REMOVAL Left 2020   right lumpectomy, sentinel node biopsy, and bilateral mammoplasty  10/2017   [facility]    Past Medical Hx:  Past Medical History:  Diagnosis Date    Breast cancer (HCC)    Family history of ovarian cancer 09/02/2017   Family history of prostate cancer in father 09/02/2017   Family history of uterine cancer 09/02/2017   Plantar fasciitis     Past Gynecological History:  Amenorrheic on goserelin.G0  Patient's last menstrual period was 10/05/2017.  Family Hx:  Family History  Problem Relation Age of Onset   Ovarian cancer Mother 44   Prostate cancer Father 46       'high gleason' unsure number   Ulcerative colitis Sister    Other Sister        abn ovaian growth, partial hysterectomy   Basal cell carcinoma Sister 71   Uterine cancer Maternal Aunt 48   Stroke Maternal Aunt 66   Basal cell carcinoma Brother    Skin cancer Paternal Uncle    Skin cancer Maternal Grandmother    Stroke Paternal Grandfather 71   Hydrocephalus Cousin     Review of Systems: See interval. On intake ROS form, no new symptoms reported.   Last mammogram: 06/03/2023  Vitals:     04/11/2024    3:13 PM 03/24/2024   10:05 AM 03/21/2024    4:04 PM  Vitals with BMI  Height  5' 5.5   Weight 168 lbs 6 oz 168 lbs   BMI 27.59 27.52   Systolic 139 124 849  Diastolic 80 66 82  Pulse 78 76 78    Exam: General: Well developed, well nourished female in no acute distress. Alert and oriented x 3.  Neck: Supple without any enlargements.  Lymph node survey: No cervical, supraclavicular, or inguinal adenopathy.  Cardiovascular: Regular rate and rhythm. S1 and S2 normal.  Lungs: Clear to auscultation bilaterally. No wheezes/crackles/rhonchi noted.  Skin: No rashes or lesions present. Skin tag noted on left anterior neck.   Abdomen: Abdomen soft, non-tender and non-obese. Active bowel sounds in all quadrants. No evidence of a fluid wave or abdominal masses.  Genitourinary:    Vulva/vagina: Normal external female genitalia. No lesions.    Urethra: No lesions or masses.    Cervix: Normal appearing, no lesions.   Uterus:  Small, mobile, no parametrial involvement or  nodularity.   Vagina: Atrophic without any lesions. No palpable masses.  Rectal: Deferred.   Extremities: No bilateral cyanosis, edema, or clubbing.    Yemaya Barnier  JONETTA Epps, NP  04/12/2024, 9:10 AM

## 2024-04-07 NOTE — Patient Instructions (Signed)
 Your CA 125 is stable compared to previous values and within normal range. Your recent ultrasound is    We will continue with the plan for ultrasounds and CA 125 check every 6 months with office visits once a year or adjust if you decide to have your ovaries removed surgically at any point.   Plan to follow up with Gynecologic Oncology at the [facility] in  2025. Please call [phone] in early 2025 to request an appointment for June 2025 with Eleanor Epps, NP.    Symptoms to report to your health care team include vaginal bleeding, rectal bleeding, bloating, weight loss without effort, new and persistent pain, new and  persistent fatigue, new leg swelling, new masses (i.e., bumps in your neck or groin), new and persistent cough, new and persistent nausea and vomiting, change in bowel or bladder habits, and any other concerns.

## 2024-04-11 ENCOUNTER — Inpatient Hospital Stay: Admitting: Gynecologic Oncology

## 2024-04-11 VITALS — BP 139/80 | HR 78 | Temp 98.4°F | Resp 18 | Wt 168.4 lb

## 2024-04-11 DIAGNOSIS — Z170 Estrogen receptor positive status [ER+]: Secondary | ICD-10-CM

## 2024-04-11 DIAGNOSIS — Z8041 Family history of malignant neoplasm of ovary: Secondary | ICD-10-CM

## 2024-04-11 DIAGNOSIS — Z8042 Family history of malignant neoplasm of prostate: Secondary | ICD-10-CM | POA: Diagnosis not present

## 2024-04-11 DIAGNOSIS — C50211 Malignant neoplasm of upper-inner quadrant of right female breast: Secondary | ICD-10-CM

## 2024-04-11 DIAGNOSIS — E8941 Symptomatic postprocedural ovarian failure: Secondary | ICD-10-CM | POA: Diagnosis not present

## 2024-04-11 DIAGNOSIS — Z923 Personal history of irradiation: Secondary | ICD-10-CM | POA: Diagnosis not present

## 2024-04-11 DIAGNOSIS — Z79811 Long term (current) use of aromatase inhibitors: Secondary | ICD-10-CM | POA: Diagnosis not present

## 2024-04-11 DIAGNOSIS — Z8049 Family history of malignant neoplasm of other genital organs: Secondary | ICD-10-CM | POA: Diagnosis not present

## 2024-04-11 DIAGNOSIS — Z9221 Personal history of antineoplastic chemotherapy: Secondary | ICD-10-CM | POA: Diagnosis not present

## 2024-04-11 DIAGNOSIS — N952 Postmenopausal atrophic vaginitis: Secondary | ICD-10-CM | POA: Diagnosis not present

## 2024-04-11 DIAGNOSIS — Z79818 Long term (current) use of other agents affecting estrogen receptors and estrogen levels: Secondary | ICD-10-CM | POA: Diagnosis not present

## 2024-04-12 ENCOUNTER — Encounter: Payer: Self-pay | Admitting: Hematology and Oncology

## 2024-06-13 ENCOUNTER — Inpatient Hospital Stay: Payer: BC Managed Care – PPO | Attending: Hematology and Oncology

## 2024-06-13 VITALS — BP 139/88 | HR 69 | Temp 98.1°F | Resp 16

## 2024-06-13 DIAGNOSIS — Z5111 Encounter for antineoplastic chemotherapy: Secondary | ICD-10-CM | POA: Insufficient documentation

## 2024-06-13 DIAGNOSIS — Z1721 Progesterone receptor positive status: Secondary | ICD-10-CM | POA: Insufficient documentation

## 2024-06-13 DIAGNOSIS — Z1731 Human epidermal growth factor receptor 2 positive status: Secondary | ICD-10-CM | POA: Diagnosis not present

## 2024-06-13 DIAGNOSIS — Z170 Estrogen receptor positive status [ER+]: Secondary | ICD-10-CM | POA: Diagnosis not present

## 2024-06-13 DIAGNOSIS — Z95828 Presence of other vascular implants and grafts: Secondary | ICD-10-CM

## 2024-06-13 DIAGNOSIS — C50211 Malignant neoplasm of upper-inner quadrant of right female breast: Secondary | ICD-10-CM | POA: Insufficient documentation

## 2024-06-13 DIAGNOSIS — Z79811 Long term (current) use of aromatase inhibitors: Secondary | ICD-10-CM | POA: Diagnosis not present

## 2024-06-13 MED ORDER — GOSERELIN ACETATE 10.8 MG ~~LOC~~ IMPL
10.8000 mg | DRUG_IMPLANT | Freq: Once | SUBCUTANEOUS | Status: AC
  Administered 2024-06-13: 10.8 mg via SUBCUTANEOUS
  Filled 2024-06-13: qty 10.8

## 2024-06-23 ENCOUNTER — Encounter: Payer: Self-pay | Admitting: Physical Medicine and Rehabilitation

## 2024-06-23 ENCOUNTER — Encounter: Attending: Physical Medicine and Rehabilitation | Admitting: Physical Medicine and Rehabilitation

## 2024-06-23 VITALS — BP 128/85 | HR 73 | Ht 65.5 in | Wt 168.0 lb

## 2024-06-23 DIAGNOSIS — M7918 Myalgia, other site: Secondary | ICD-10-CM | POA: Insufficient documentation

## 2024-06-23 MED ORDER — LIDOCAINE HCL 1 % IJ SOLN
3.0000 mL | Freq: Once | INTRAMUSCULAR | Status: AC
  Administered 2024-06-23: 3 mL

## 2024-06-23 MED ORDER — SODIUM CHLORIDE (PF) 0.9 % IJ SOLN
2.0000 mL | Freq: Once | INTRAMUSCULAR | Status: AC
  Administered 2024-06-23: 2 mL

## 2024-06-23 NOTE — Progress Notes (Signed)

## 2024-07-06 ENCOUNTER — Other Ambulatory Visit (HOSPITAL_BASED_OUTPATIENT_CLINIC_OR_DEPARTMENT_OTHER): Payer: Self-pay | Admitting: *Deleted

## 2024-07-06 DIAGNOSIS — R0989 Other specified symptoms and signs involving the circulatory and respiratory systems: Secondary | ICD-10-CM

## 2024-07-06 NOTE — Progress Notes (Signed)
 Received order from Dr Darice Henle for carotid duplex  Order placed

## 2024-07-11 ENCOUNTER — Ambulatory Visit: Payer: BC Managed Care – PPO | Admitting: Hematology and Oncology

## 2024-07-12 LAB — COLOGUARD: COLOGUARD: NEGATIVE

## 2024-07-18 ENCOUNTER — Telehealth: Payer: Self-pay

## 2024-07-18 NOTE — Telephone Encounter (Signed)
 Spoke with patient and confirmed appointment on 10/22

## 2024-07-19 ENCOUNTER — Inpatient Hospital Stay: Payer: Self-pay | Attending: Hematology and Oncology | Admitting: Hematology and Oncology

## 2024-07-19 VITALS — BP 158/85 | HR 79 | Temp 97.3°F | Resp 16 | Wt 168.3 lb

## 2024-07-19 DIAGNOSIS — Z8041 Family history of malignant neoplasm of ovary: Secondary | ICD-10-CM | POA: Diagnosis not present

## 2024-07-19 DIAGNOSIS — Z1721 Progesterone receptor positive status: Secondary | ICD-10-CM | POA: Diagnosis not present

## 2024-07-19 DIAGNOSIS — Z923 Personal history of irradiation: Secondary | ICD-10-CM | POA: Insufficient documentation

## 2024-07-19 DIAGNOSIS — Z1731 Human epidermal growth factor receptor 2 positive status: Secondary | ICD-10-CM | POA: Diagnosis not present

## 2024-07-19 DIAGNOSIS — N644 Mastodynia: Secondary | ICD-10-CM | POA: Diagnosis not present

## 2024-07-19 DIAGNOSIS — Z9221 Personal history of antineoplastic chemotherapy: Secondary | ICD-10-CM | POA: Insufficient documentation

## 2024-07-19 DIAGNOSIS — G629 Polyneuropathy, unspecified: Secondary | ICD-10-CM | POA: Diagnosis not present

## 2024-07-19 DIAGNOSIS — Z8049 Family history of malignant neoplasm of other genital organs: Secondary | ICD-10-CM | POA: Diagnosis not present

## 2024-07-19 DIAGNOSIS — C50211 Malignant neoplasm of upper-inner quadrant of right female breast: Secondary | ICD-10-CM | POA: Diagnosis present

## 2024-07-19 DIAGNOSIS — Z8042 Family history of malignant neoplasm of prostate: Secondary | ICD-10-CM | POA: Insufficient documentation

## 2024-07-19 DIAGNOSIS — Z79811 Long term (current) use of aromatase inhibitors: Secondary | ICD-10-CM | POA: Insufficient documentation

## 2024-07-19 DIAGNOSIS — Z170 Estrogen receptor positive status [ER+]: Secondary | ICD-10-CM | POA: Diagnosis not present

## 2024-07-19 DIAGNOSIS — N951 Menopausal and female climacteric states: Secondary | ICD-10-CM | POA: Diagnosis not present

## 2024-07-19 DIAGNOSIS — R0989 Other specified symptoms and signs involving the circulatory and respiratory systems: Secondary | ICD-10-CM | POA: Insufficient documentation

## 2024-07-19 DIAGNOSIS — I890 Lymphedema, not elsewhere classified: Secondary | ICD-10-CM | POA: Insufficient documentation

## 2024-07-19 DIAGNOSIS — Z808 Family history of malignant neoplasm of other organs or systems: Secondary | ICD-10-CM | POA: Diagnosis not present

## 2024-07-19 DIAGNOSIS — Z823 Family history of stroke: Secondary | ICD-10-CM | POA: Insufficient documentation

## 2024-07-19 NOTE — Progress Notes (Signed)
 [facility]  Telephone:([phone]) [phone] Fax:(336) [phone]     ID: Hannah Woodard DOB: Jul 29, 1971  MR#: [id number]  RDW#:[id number]  Patient Care Team: Burney Darice CROME, MD as PCP - General (Family Medicine) Curvin Deward MOULD, MD as Consulting Physician (General Surgery) Dewey Rush, MD as Consulting Physician (Radiation Oncology) Ardeen Rollene Kass, MD (Radiology) Howard-McNatt, Daved, MD as Referring Physician (Surgery) Loretha Ash, MD as Consulting Physician (Hematology and Oncology)  CHIEF COMPLAINT: Triple positive breast cancer  CURRENT TREATMENT: anastrozole , Zoladex   INTERVAL HISTORY: Hannah Woodard returns today for follow-up of her triple positive breast cancer.   Discussed the use of AI scribe software for clinical note transcription with the patient, who gave verbal consent to proceed.  History of Present Illness Hannah Woodard is a 53 year old female with breast cancer who presents for follow-up regarding her ongoing treatment and management.  She continues to take anastrozole , which she started in July 2019, and is approaching the end of her treatment course in June 2026. She underwent the breast cancer index test, which recommended a 7-year course of anastrozole . She receives Zoladex  injections every 12 weeks, with the next one scheduled for December. A recent mammogram at the [facility] was normal, and her breast density has decreased from C to B since her diagnosis.  She experiences breast pain, which she manages with scar therapy and chiropractic care. She notes improvement but occasionally uses cups and cocoa butter for relief. She also reports burning in her feet, which she manages with a grounding mat and red light therapy, noting some improvement.  Her blood pressure was noted to be high, but she attributes this to rushing around before the appointment. She does not currently monitor her blood pressure at home as she lent her monitor to her  sister.  She has a significant family history of stroke, with her mother, aunt, and grandmother having experienced strokes. She is scheduled for a carotid ultrasound on November 3rd. She experiences headaches and has a history of hearing her heartbeat in her ears.  She works with the hearing impaired . She notes a decrease in falls compared to previous visits   HISTORY OF CURRENT ILLNESS: From the original intake note:  The patient has a history of cysts and nodules in the right breast which have been closely followed, with exams 07/30/2016 and 01/28/2017.  On 07/29/2017 follow-up right breast ultrasonography showed no findings of concern.  Bilateral diagnostic mammography 08/18/2017 with right breast ultrasonography on 08/18/2017 at [facility] found the breast density to be category C.  There was now a new irregular high density mass in the posterior portion of the right breast seen on the mediolateral oblique view only.  Ultrasonography confirmed a 1.4 cm lobulated mass in the upper inner quadrant of the right breast 10 cm from the nipple associated with pectoral muscle invasion.  The right axilla was sonographically benign.  Biopsy of this mass 08/24/2017 showed ([id number]) invasive ductal carcinoma, grade 2, estrogen receptor 95% positive, progesterone receptor 100% positive, both with strong staining intensity, with an MIB-1 of 60%, and HER-2 amplified, with a signals ratio of 2.84 (full report pending).  The patient's subsequent history is as detailed below.   PAST MEDICAL HISTORY: Past Medical History:  Diagnosis Date   Breast cancer [facility])    Family history of ovarian cancer 09/02/2017   Family history of prostate cancer in father 09/02/2017   Family history of uterine cancer 09/02/2017   Plantar fasciitis  PAST SURGICAL HISTORY: Past Surgical History:  Procedure Laterality Date   PORT-A-CATH REMOVAL Left 2020   right lumpectomy, sentinel node biopsy, and bilateral mammoplasty   10/2017   [facility]    FAMILY HISTORY Family History  Problem Relation Age of Onset   Ovarian cancer Mother 48   Prostate cancer Father 65       'high gleason' unsure number   Ulcerative colitis Sister    Other Sister        abn ovaian growth, partial hysterectomy   Basal cell carcinoma Sister 42   Uterine cancer Maternal Aunt 55   Stroke Maternal Aunt 66   Basal cell carcinoma Brother    Skin cancer Paternal Uncle    Skin cancer Maternal Grandmother    Stroke Paternal Grandfather 33   Hydrocephalus Cousin   The patient's father died from alcoholic cirrhosis at age 62.  He had been diagnosed with prostate cancer at age 81.  The patient's mother was diagnosed with ovarian cancer at age 62 and died a year later.  The patient has 1 brother, 1 sister.  The patient's brother has had basal cell and other skin cancers.  A paternal aunt had uterine cancer.  Other relatives have had skin cancers but the patient does not know if these were melanomas or not   GYNECOLOGIC HISTORY:  Patient's last menstrual period was 10/05/2017.  Menarche age 72, the patient has never been pregnant.  She is still having regular periods.  She used oral contraceptives briefly in the past without complications. She and her husband are very interested in having children, and currently (December 2018) they are not using any contraception.   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  Her husband Donnice (goes by [employer]) is a Curator.  At home it's just them, a 52 year old [place] hound and a cat.   ADVANCED DIRECTIVES: In the absence of any documents to the contrary the patient's husband is her healthcare power of attorney   HEALTH MAINTENANCE: Social History   Tobacco Use   Smoking status: Never   Smokeless tobacco: Never  Vaping Use   Vaping status: Never Used  Substance Use Topics   Alcohol use: Not Currently    Comment: rarely     Drug use: No     Colonoscopy: Never  PAP:  Bone density:    Allergies  Allergen Reactions   Lactose Diarrhea   Poison Ivy Extract Hives, Itching and Rash   Sulfamethoxazole  Rash   Sulfamethoxazole -Trimethoprim  Rash   Adhesive [Tape]    Milk-Related Compounds Diarrhea    Stomach cramping   Other Rash and Other (See Comments)    Frangrance    Current Outpatient Medications  Medication Sig Dispense Refill   anastrozole  (ARIMIDEX ) 1 MG tablet Take 1 tablet (1 mg total) by mouth daily. 90 tablet 4   ascorbic acid (VITAMIN C) 1000 MG tablet Take by mouth.     b complex vitamins tablet Take 1 tablet by mouth daily.     Bioflavonoid Products (QUERCETIN COMPLEX IMMUNE PO) Take by mouth. W/ bromelain     Cholecalciferol (VITAMIN D3) 5000 units TABS Take 10,000 Units by mouth daily.     COLLAGEN PO Take by mouth daily. Takes beef collagen     desoximetasone (TOPICORT) 0.25 % cream Apply topically daily as needed.     fish oil-omega-3 fatty acids 1000 MG capsule Take 1 g by mouth daily.  glucosamine-chondroitin 500-400 MG tablet Take 3 tablets by mouth daily.     Green Tea 200 MG CAPS Take 1 capsule by mouth daily in the afternoon.     lactobacillus acidophilus (BACID) TABS tablet Take 2 tablets by mouth 3 (three) times daily.     Magnesium Citrate POWD Take by mouth daily.     Magnesium Glycinate 100 MG CAPS Take 200 mg by mouth 2 (two) times daily.     meloxicam  (MOBIC ) 15 MG tablet TAKE 1 TABLET (15 MG TOTAL) BY MOUTH DAILY. 30 tablet 0   Menaquinone-7 (VITAMIN K2 PO) Take 150 mcg by mouth daily.     montelukast (SINGULAIR) 10 MG tablet TAKE 1 TABLET (10 MG) BY MOUTH EVERY EVENING     senna (SENOKOT) 8.6 MG tablet Take 1 tablet by mouth at bedtime as needed for constipation.     Suzetrigine  (JOURNAVX ) 50 MG TABS Take 1 tablet by mouth 2 (two) times daily as needed. 30 tablet 0   triamcinolone  ointment (KENALOG ) 0.1 % Apply 1 application topically 2 (two) times daily. 80 g 1    TURMERIC PO Take 1 capsule by mouth as directed.      UNABLE TO FIND Take 500 mg by mouth daily. [place] T[place] mushroom     UNABLE TO FIND Take 500 mg by mouth daily. [place] Mushroom     Current Facility-Administered Medications  Medication Dose Route Frequency Provider Last Rate Last Admin   sodium chloride  (PF) 0.9 % injection 2 mL  2 mL Intravenous PRN Raulkar, Sven SQUIBB, MD        OBJECTIVE:  white woman who appears younger than stated age  There were no vitals filed for this visit.    There is no height or weight on file to calculate BMI.   Wt Readings from Last 3 Encounters:  06/23/24 168 lb (76.2 kg)  04/11/24 168 lb 6.4 oz (76.4 kg)  03/24/24 168 lb (76.2 kg)   ECOG FS:1 - Symptomatic but completely ambulatory  Physical Exam Constitutional:      Appearance: Normal appearance.  Chest:     Comments:  She is status post bilateral reduction mammoplasty with some asymmetry.  No palpable masses or regional adenopathy Musculoskeletal:        General: No swelling.     Cervical back: Normal range of motion and neck supple. No rigidity.  Lymphadenopathy:     Cervical: No cervical adenopathy.  Neurological:     Mental Status: She is alert.       LAB RESULTS:  CMP     Component Value Date/Time   NA 142 09/20/2023 1224   NA 138 09/01/2017 1238   K 3.7 09/20/2023 1224   K 4.4 09/01/2017 1238   CL 104 09/20/2023 1224   CO2 30 09/20/2023 1224   CO2 25 09/01/2017 1238   GLUCOSE 110 (H) 09/20/2023 1224   GLUCOSE 83 09/01/2017 1238   BUN 16 09/20/2023 1224   BUN 9.2 09/01/2017 1238   CREATININE 0.51 09/20/2023 1224   CREATININE 0.53 12/31/2020 1531   CREATININE 0.7 09/01/2017 1238   CALCIUM 9.5 09/20/2023 1224   CALCIUM 9.4 09/01/2017 1238   PROT 6.7 09/20/2023 1224   PROT 7.4 09/01/2017 1238   ALBUMIN 4.5 09/20/2023 1224   ALBUMIN 4.2 09/01/2017 1238   AST 18 09/20/2023 1224   AST 21 09/01/2017 1238   ALT 22 09/20/2023 1224   ALT 21 09/01/2017 1238   ALKPHOS 105  09/20/2023 1224   ALKPHOS  60 09/01/2017 1238   BILITOT 0.5 09/20/2023 1224   BILITOT 0.65 09/01/2017 1238   GFRNONAA >60 09/20/2023 1224   GFRNONAA 113 12/31/2020 1531   GFRAA 131 12/31/2020 1531    No results found for: STEPHANY RINGS, A1GS, A2GS, BETS, BETA2SER, GAMS, MSPIKE, SPEI  No results found for: JONATHAN BONG, [facility]  Lab Results  Component Value Date   WBC 6.1 09/20/2023   NEUTROABS 4.3 09/20/2023   HGB 13.6 09/20/2023   HCT 38.4 09/20/2023   MCV 93.4 09/20/2023   PLT 259 09/20/2023      Chemistry      Component Value Date/Time   NA 142 09/20/2023 1224   NA 138 09/01/2017 1238   K 3.7 09/20/2023 1224   K 4.4 09/01/2017 1238   CL 104 09/20/2023 1224   CO2 30 09/20/2023 1224   CO2 25 09/01/2017 1238   BUN 16 09/20/2023 1224   BUN 9.2 09/01/2017 1238   CREATININE 0.51 09/20/2023 1224   CREATININE 0.53 12/31/2020 1531   CREATININE 0.7 09/01/2017 1238      Component Value Date/Time   CALCIUM 9.5 09/20/2023 1224   CALCIUM 9.4 09/01/2017 1238   ALKPHOS 105 09/20/2023 1224   ALKPHOS 60 09/01/2017 1238   AST 18 09/20/2023 1224   AST 21 09/01/2017 1238   ALT 22 09/20/2023 1224   ALT 21 09/01/2017 1238   BILITOT 0.5 09/20/2023 1224   BILITOT 0.65 09/01/2017 1238       No results found for: LABCA2  No components found for: [id number]  No results for input(s): INR in the last 168 hours.  No results found for: LABCA2  No results found for: CAN199  Lab Results  Component Value Date   CAN125 17.3 04/03/2024    No results found for: CAN153  No results found for: CA2729  No components found for: HGQUANT  No results found for: CEA1, CEA / No results found for: CEA1, CEA   No results found for: AFPTUMOR  No results found for: CHROMOGRNA  No results found for: PSA1  No visits with results within 3 Day(s) from this visit.  Latest known visit with results is:   Appointment on 04/03/2024  Component Date Value Ref Range Status   Cancer Antigen (CA) 125 04/03/2024 17.3  0.0 - 38.1 U/mL Final   Comment: (NOTE) Roche Diagnostics Electrochemiluminescence Immunoassay (ECLIA) Values obtained with different assay methods or kits cannot be used interchangeably.  Results cannot be interpreted as absolute evidence of the presence or absence of malignant disease. Performed At: [facility] [address] [place], [place] [id number] Jennette Shorter MD Ey:[phone]     (this displays the last labs from the last 3 days)  No results found for: TOTALPROTELP, ALBUMINELP, A1GS, A2GS, BETS, BETA2SER, GAMS, MSPIKE, SPEI (this displays SPEP labs)  No results found for: KPAFRELGTCHN, LAMBDASER, KAPLAMBRATIO (kappa/lambda light chains)  No results found for: HGBA, HGBA2QUANT, HGBFQUANT, HGBSQUAN (Hemoglobinopathy evaluation)   No results found for: LDH  Lab Results  Component Value Date   IRON 87 12/31/2020   TIBC 276 12/31/2020   IRONPCTSAT 32 12/31/2020   (Iron and TIBC)  Lab Results  Component Value Date   FERRITIN 262 (H) 12/31/2020    Urinalysis    Component Value Date/Time   COLORURINE YELLOW 12/31/2020 1531   APPEARANCEUR CLEAR 12/31/2020 1531   LABSPEC 1.006 12/31/2020 1531   PHURINE 8.0 12/31/2020 1531   GLUCOSEU NEGATIVE 12/31/2020 1531   HGBUR NEGATIVE 12/31/2020 1531   BILIRUBINUR NEGATIVE  12/27/2017 1905   KETONESUR NEGATIVE 12/31/2020 1531   PROTEINUR NEGATIVE 12/31/2020 1531   UROBILINOGEN 0.2 12/06/2014 1529   NITRITE NEGATIVE 12/31/2020 1531   LEUKOCYTESUR 2+ (A) 12/31/2020 1531    STUDIES: No results found.  Bilateral diagnostic mammography with tomography at [facility] 11/15/2019 Calcifications in the far posterior right breast near the lumpectomy site. These would not be amenable to stereotactic biopsy given their location. A short-term follow-up is recommended.   Left  breast mass, likely representing fat necrosis.   Breast composition: Scattered fibroglandular density.   BI-RADS Category: 3 - Probably benign findings.   RECOMMENDATION: Short Interval Follow-up with bilateral diagnostic mammogram and possible left breast ultrasound in 6 months. Additionally, given that the patient has a far posterior lumpectomy site incompletely evaluated mammographically, yearly MRI is recommended.    ELIGIBLE FOR AVAILABLE RESEARCH PROTOCOL: No  ASSESSMENT: 53 y.o. [place] woman status post right breast upper inner quadrant biopsy 08/24/2017 for a clinical T1c N0 invasive ductal carcinoma, triple positive, with an MIB-1 of 60%  (1) genetics testing 09/10/2017 through the Common Hereditary Cancer Panel + Melanoma Panel found no deleterious mutations in: APC, ATM, AXIN2, BAP1, BARD1, BMPR1A, BRCA1, BRCA2, BRIP1, CDH1, CDK4, CDKN2A (p14ARF), CDKN2A (p16INK4a), CHEK2, CTNNA1, DICER1, EPCAM*, GREM1*, KIT, MEN1, MLH1, MSH2, MSH3, MSH6, MUTYH, NBN, NF1, PALB2, PDGFRA, PMS2, POLD1, POLE, POT1, PTEN, RAD50, RAD51C, RAD51D, RB1, SDHB, SDHC, SDHD, SMAD4, SMARCA4, STK11, TP53, TSC1, TSC2, VHL. The following genes were evaluated for sequence changes only: HOXB13*, MITF*, NTHL1*, SDHA  (2) status post right lumpectomy and sentinel axillary lymph node sampling 10/07/2017 at [facility] for a pT1C pN0, stage IA invasive ductal carcinoma, grade 3, with negative margins  (a) 1 sentinel lymph node removed, bilateral oncoplastic breast reduction  (3) adjuvant chemotherapy consisting of paclitaxel  weekly x12 and trastuzumab  every 21 days STARTING 11/12/2017  (a) paclitaxel  discontinued after 7 doses (last dose 12/24/2017) due to neuropathy  (4) continued trastuzumab  to complete a year (through February 2020).  (a) baseline echocardiogram 09/10/2017 shows an ejection fraction of 55-60%  (b) echocardiogram 12/09/2017 showed an ejection fraction in the 60-65% range  (c) echo in 06/14/2018  shows EF of 60-65%  (d) echo on 10/17/2018 shows EF of 60-65%  (5) adjuvant radiation 02/03/2018 - 03/18/2018  Site/dose:   The patient initially received a dose of 50.4 Gy in 28 fractions to the breast using whole-breast tangent fields. This was delivered using a 3-D conformal technique. The patient then received a boost to the seroma. This delivered an additional 10 Gy in 5 fractions using 9e electrons with a special teletherapy technique. The total dose was 60.4 Gy.   (6) anastrozole  started 03/28/2018  (a) bone density 09/05/2018--normal  (b) patient refuses tamoxifen because of family history of strokes and uterine cancer  (c) breast cancer index predicts a risk of late recurrence of 10.6% and suggest significant benefit from continuing endocrine therapy for an extended period  (d) repeat bone density 10/30/2020 shows a T score of -0.8, normal  (7) goserelin started 09/29/2017, discontinued after 08/05/2018 dose, restarted on 11/04/2018 when her menses resumed  (a) changed to every 6-month dose beginning November 2021 Pt will continue anti estrogen therapy for 7 yrs, will complete in July 2026.   PLAN:  Ms. Monda is here for follow-up on anastrozole  and Zoladex .  Assessment and Plan Assessment & Plan Estrogen receptor positive right breast cancer On anastrozole  and Zoladex  therapy per Breast Cancer Index test recommendation. Breast pain improved with scar therapy,  chiropractic care, red light therapy, and ice. Discussed MRD detection blood tests post-therapy. - Continue anastrozole  until June 2026. - Administer Zoladex  injections every twelve weeks until May 2026. - Consider MRD detection blood tests post-therapy completion. - Schedule annual follow-up visits post-therapy.  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 Mild. Ok to continue massages.  Menopausal symptoms secondary to ovarian  suppression Experiencing menopausal symptoms due to Zoladex . Explained potential menstrual cycle return post-Zoladex .  Peripheral neuropathy Reports burning sensations in feet, managed with grounding mats, red light therapy, and ice. Improvement noted. - Recommend over-the-counter capsaicin  cream for neuropathy.  Carotid bruit, pending evaluation Detected carotid bruit with significant family history of stroke, warranting further evaluation. - Perform carotid ultrasound on November 3rd. - Discuss potential for coronary artery calcium score with primary care provider.   Total time spent: 30 min  *Total Encounter Time as defined by the Centers for Medicare and Medicaid Services includes, in addition to the face-to-face time of a patient visit (documented in the note above) non-face-to-face time: obtaining and reviewing outside history, ordering and reviewing medications, tests or procedures, care coordination (communications with other health care professionals or caregivers) and documentation in the medical record.

## 2024-07-23 ENCOUNTER — Other Ambulatory Visit: Payer: Self-pay | Admitting: Hematology and Oncology

## 2024-07-31 ENCOUNTER — Encounter (INDEPENDENT_AMBULATORY_CARE_PROVIDER_SITE_OTHER)

## 2024-07-31 DIAGNOSIS — R0989 Other specified symptoms and signs involving the circulatory and respiratory systems: Secondary | ICD-10-CM

## 2024-08-15 ENCOUNTER — Other Ambulatory Visit: Payer: Self-pay | Admitting: Family Medicine

## 2024-08-15 DIAGNOSIS — I773 Arterial fibromuscular dysplasia: Secondary | ICD-10-CM

## 2024-08-21 ENCOUNTER — Encounter: Attending: Physical Medicine and Rehabilitation | Admitting: Physical Medicine and Rehabilitation

## 2024-08-21 ENCOUNTER — Encounter: Payer: Self-pay | Admitting: Physical Medicine and Rehabilitation

## 2024-08-21 VITALS — BP 134/86 | HR 72 | Ht 65.5 in | Wt 171.0 lb

## 2024-08-21 DIAGNOSIS — M7918 Myalgia, other site: Secondary | ICD-10-CM | POA: Diagnosis not present

## 2024-08-21 MED ORDER — LIDOCAINE HCL 1 % IJ SOLN
5.0000 mL | Freq: Once | INTRAMUSCULAR | Status: AC
  Administered 2024-08-21: 5 mL via INTRADERMAL

## 2024-08-21 NOTE — Progress Notes (Signed)
Trigger Point Injection  Indication: Cervical myofascial pain not relieved by medication management and other conservative care.  Informed consent was obtained after describing risk and benefits of the procedure with the patient, this includes bleeding, bruising, infection and medication side effects.  The patient wishes to proceed and has given written consent.  The patient was placed in a seated position.  The area of pain was marked and prepped with Betadine.  It was entered with a 25-gauge 1/2 inch needle and a total of 5 mL of 1% lidocaine and normal saline was injected into a total of 10 trigger points, after negative draw back for blood.  The patient tolerated the procedure well.  Post procedure instructions were given.  

## 2024-08-28 ENCOUNTER — Ambulatory Visit
Admission: RE | Admit: 2024-08-28 | Discharge: 2024-08-28 | Disposition: A | Source: Ambulatory Visit | Attending: Family Medicine | Admitting: Family Medicine

## 2024-08-28 DIAGNOSIS — I773 Arterial fibromuscular dysplasia: Secondary | ICD-10-CM

## 2024-09-05 ENCOUNTER — Inpatient Hospital Stay: Payer: BC Managed Care – PPO | Attending: Hematology and Oncology

## 2024-09-05 VITALS — BP 139/90 | HR 79 | Temp 98.1°F | Resp 18

## 2024-09-05 DIAGNOSIS — Z170 Estrogen receptor positive status [ER+]: Secondary | ICD-10-CM | POA: Diagnosis not present

## 2024-09-05 DIAGNOSIS — C50211 Malignant neoplasm of upper-inner quadrant of right female breast: Secondary | ICD-10-CM | POA: Insufficient documentation

## 2024-09-05 DIAGNOSIS — Z5111 Encounter for antineoplastic chemotherapy: Secondary | ICD-10-CM | POA: Diagnosis present

## 2024-09-05 DIAGNOSIS — Z79811 Long term (current) use of aromatase inhibitors: Secondary | ICD-10-CM | POA: Insufficient documentation

## 2024-09-05 DIAGNOSIS — Z1721 Progesterone receptor positive status: Secondary | ICD-10-CM | POA: Insufficient documentation

## 2024-09-05 DIAGNOSIS — Z1731 Human epidermal growth factor receptor 2 positive status: Secondary | ICD-10-CM | POA: Diagnosis not present

## 2024-09-05 DIAGNOSIS — Z95828 Presence of other vascular implants and grafts: Secondary | ICD-10-CM

## 2024-09-05 MED ORDER — GOSERELIN ACETATE 10.8 MG ~~LOC~~ IMPL
10.8000 mg | DRUG_IMPLANT | Freq: Once | SUBCUTANEOUS | Status: AC
  Administered 2024-09-05: 10.8 mg via SUBCUTANEOUS
  Filled 2024-09-05: qty 10.8

## 2024-09-11 ENCOUNTER — Encounter: Payer: Self-pay | Admitting: Surgery

## 2024-09-11 ENCOUNTER — Ambulatory Visit: Attending: Surgery | Admitting: Surgery

## 2024-09-11 VITALS — BP 135/85 | HR 81 | Temp 98.2°F | Ht 65.5 in | Wt 170.0 lb

## 2024-09-11 DIAGNOSIS — I6521 Occlusion and stenosis of right carotid artery: Secondary | ICD-10-CM | POA: Diagnosis not present

## 2024-09-11 NOTE — Progress Notes (Signed)
 [facility] [place]  Patient name: Hannah Woodard MRN: [id number] DOB: [birth year] Sex: female   REQUESTING PROVIDER:    Darice Henle   REASON FOR CONSULT:    Carotid FMD  HISTORY OF PRESENT ILLNESS:   Hannah Woodard is a 53 y.o. 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  Her initial study was done because a bruit was auscultated.  She does have a family history of stroke in 2 of her family members.  Her sister who is 54 years older also had a brain aneurysm.  She denies any neurologic symptoms such as numbness or weakness in either extremity, slurred speech, or amaurosis fugax  Patient has a history of breast cancer.  She is a non-smoker.  PAST MEDICAL HISTORY    Past Medical History:  Diagnosis Date   Breast cancer (HCC)    Family history of ovarian cancer 09/02/2017   Family history of prostate cancer in father 09/02/2017   Family history of uterine cancer 09/02/2017   Plantar fasciitis      FAMILY HISTORY   Family History  Problem Relation Age of Onset   Ovarian cancer Mother 67   Prostate cancer Father 40       'high gleason' unsure number   Ulcerative colitis Sister    Other Sister        abn ovaian growth, partial hysterectomy   Basal cell carcinoma Sister 71   Uterine cancer Maternal Aunt 55   Stroke Maternal Aunt 66   Basal cell carcinoma Brother    Skin cancer Paternal Uncle    Skin cancer Maternal Grandmother    Stroke Paternal Grandfather 21   Hydrocephalus Cousin     SOCIAL HISTORY:   Social History   Socioeconomic History   Marital status: Married    Spouse name: Not on file   Number of children: Not on file   Years of education: Not on file   Highest education level: Not on file  Occupational History   Occupation: transport planner  Tobacco Use   Smoking status: Never   Smokeless tobacco: Never  Vaping Use   Vaping  status: Never Used  Substance and Sexual Activity   Alcohol use: Not Currently    Comment: rarely    Drug use: No   Sexual activity: Yes    Partners: Male    Birth control/protection: Other-see comments    Comment: chemical menopaus  Other Topics Concern   Not on file  Social History Narrative   Not on file   Social Drivers of Health   Tobacco Use: Low Risk (09/11/2024)   Patient History    Smoking Tobacco Use: Never    Smokeless Tobacco Use: Never    Passive Exposure: Not on file  Financial Resource Strain: Not on file  Food Insecurity: Not on file  Transportation Needs: Not on file  Physical Activity: Not on file  Stress: Not on file  Social Connections: Not on file  Intimate Partner Violence: Not on file  Depression (PHQ2-9): Low Risk (03/24/2024)   Depression (PHQ2-9)    PHQ-2 Score: 0  Alcohol Screen: Not on file  Housing: Not on file  Utilities: Not on file  Health Literacy: Not on file    ALLERGIES:    Allergies[1]  CURRENT MEDICATIONS:    Current Outpatient Medications  Medication Sig Dispense Refill   anastrozole  (ARIMIDEX ) 1 MG tablet TAKE 1 TABLET BY MOUTH EVERY DAY 90 tablet  4   ascorbic acid (VITAMIN C) 1000 MG tablet Take by mouth.     b complex vitamins tablet Take 1 tablet by mouth daily.     Cholecalciferol (VITAMIN D3) 5000 units TABS Take 10,000 Units by mouth daily.     COLLAGEN PO Take by mouth daily. Takes beef collagen     desoximetasone (TOPICORT) 0.25 % cream Apply topically daily as needed.     glucosamine-chondroitin 500-400 MG tablet Take 3 tablets by mouth daily.     Green Tea 200 MG CAPS Take 1 capsule by mouth daily in the afternoon.     lactobacillus acidophilus (BACID) TABS tablet Take 2 tablets by mouth 3 (three) times daily.     Magnesium Citrate POWD Take by mouth daily.     Magnesium Glycinate 100 MG CAPS Take 200 mg by mouth 2 (two) times daily.     meloxicam  (MOBIC ) 15 MG tablet TAKE 1 TABLET (15 MG TOTAL) BY MOUTH DAILY.  30 tablet 0   Menaquinone-7 (VITAMIN K2 PO) Take 150 mcg by mouth daily.     montelukast (SINGULAIR) 10 MG tablet TAKE 1 TABLET (10 MG) BY MOUTH EVERY EVENING     senna (SENOKOT) 8.6 MG tablet Take 1 tablet by mouth at bedtime as needed for constipation.     Suzetrigine  (JOURNAVX ) 50 MG TABS Take 1 tablet by mouth 2 (two) times daily as needed. 30 tablet 0   triamcinolone  ointment (KENALOG ) 0.1 % Apply 1 application topically 2 (two) times daily. 80 g 1   TURMERIC PO Take 1 capsule by mouth as directed.      UNABLE TO FIND Take 500 mg by mouth daily. Turkey Tail mushroom     UNABLE TO FIND Take 500 mg by mouth daily. [place] Mushroom     Current Facility-Administered Medications  Medication Dose Route Frequency Provider Last Rate Last Admin   sodium chloride  (PF) 0.9 % injection 2 mL  2 mL Intravenous PRN Raulkar, Sven SQUIBB, MD        REVIEW OF SYSTEMS:   [X]  denotes positive finding, [ ]  denotes negative finding Cardiac  Comments:  Chest pain or chest pressure:    Shortness of breath upon exertion:    Short of breath when lying flat:    Irregular heart rhythm:        Vascular    Pain in calf, thigh, or hip brought on by ambulation:    Pain in feet at night that wakes you up from your sleep:     Blood clot in your veins:    Leg swelling:         Pulmonary    Oxygen at home:    Productive cough:     Wheezing:         Neurologic    Sudden weakness in arms or legs:     Sudden numbness in arms or legs:     Sudden onset of difficulty speaking or slurred speech:    Temporary loss of vision in one eye:     Problems with dizziness:         Gastrointestinal    Blood in stool:      Vomited blood:         Genitourinary    Burning when urinating:     Blood in urine:        Psychiatric    Major depression:         Hematologic    Bleeding problems:  Problems with blood clotting too easily:        Skin    Rashes or ulcers:        Constitutional    Fever or chills:      PHYSICAL EXAM:   Vitals:   09/11/24 0937  BP: 135/85  Pulse: 81  Temp: 98.2 F (36.8 C)  SpO2: 95%  Weight: 170 lb (77.1 kg)  Height: 5' 5.5 (1.664 m)    GENERAL: The patient is a well-nourished female, in no acute distress. The vital signs are documented above. CARDIAC: There is a regular rate and rhythm.  VASCULAR: Right carotid bruit PULMONARY: Nonlabored respirations MUSCULOSKELETAL: There are no major deformities or cyanosis. NEUROLOGIC: No focal weakness or paresthesias are detected. SKIN: There are no ulcers or rashes noted. PSYCHIATRIC: The patient has a normal affect.  STUDIES:   I have reviewed the following duplex: Right Carotid: Velocities in the right ICA are consistent with a 40-59%                 stenosis. With bead-like appearance and turbulent flow and  the                absence of plaque this most likely appears to be  fibromuscular                dysplasia (FMD).   Left Carotid: Velocities in the left ICA are consistent with a 40-59%  stenosis.               With bead-like appearance and turbulent flow and the absence  of               plaque this most likely appears to be fibromuscular  dysplasia               (FMD).   Renal duplex: 1. No evidence of renal artery stenosis bilaterally. 2. Mild atherosclerosis of the abdominal aorta without aneurysm.     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  She does have a family history of cerebrovascular disease.  We talked about the importance of medical management including blood pressure control, cholesterol management, abstinence from smoking, healthy eating, and antiplatelet therapy.  We discussed the possibility of taking an 81 mg aspirin.  She will follow-up in 1 year with surveillance ultrasound   Malvina New, IV, MD, FACS [facility] [phone] Pager [phone]      [1]  Allergies Allergen Reactions   Lactose Diarrhea   Poison Ivy Extract Hives, Itching and Rash   Sulfamethoxazole  Rash   Sulfamethoxazole -Trimethoprim  Rash   Adhesive [Tape]    Milk-Related Compounds Diarrhea    Stomach cramping   Other Rash and Other (See Comments)    Donette

## 2024-10-12 ENCOUNTER — Ambulatory Visit (HOSPITAL_COMMUNITY)
Admission: RE | Admit: 2024-10-12 | Discharge: 2024-10-12 | Disposition: A | Discharge status: Home / Self Care | Source: Ambulatory Visit | Attending: Gynecologic Oncology | Admitting: Gynecologic Oncology

## 2024-10-12 ENCOUNTER — Inpatient Hospital Stay: Attending: Hematology and Oncology

## 2024-10-12 DIAGNOSIS — Z803 Family history of malignant neoplasm of breast: Secondary | ICD-10-CM | POA: Insufficient documentation

## 2024-10-12 DIAGNOSIS — Z8041 Family history of malignant neoplasm of ovary: Secondary | ICD-10-CM | POA: Insufficient documentation

## 2024-10-12 DIAGNOSIS — D259 Leiomyoma of uterus, unspecified: Secondary | ICD-10-CM | POA: Insufficient documentation

## 2024-10-12 DIAGNOSIS — Z170 Estrogen receptor positive status [ER+]: Secondary | ICD-10-CM | POA: Insufficient documentation

## 2024-10-12 DIAGNOSIS — D529 Folate deficiency anemia, unspecified: Secondary | ICD-10-CM | POA: Diagnosis present

## 2024-10-12 DIAGNOSIS — C50211 Malignant neoplasm of upper-inner quadrant of right female breast: Secondary | ICD-10-CM | POA: Insufficient documentation

## 2024-10-13 ENCOUNTER — Ambulatory Visit: Payer: Self-pay | Admitting: Gynecologic Oncology

## 2024-10-13 LAB — CA 125: Cancer Antigen (CA) 125: 17.6 U/mL (ref 0.0–38.1)

## 2024-10-27 ENCOUNTER — Ambulatory Visit: Admitting: Family Medicine

## 2024-10-27 ENCOUNTER — Encounter: Payer: Self-pay | Admitting: Family Medicine

## 2024-10-27 VITALS — BP 158/96 | HR 90 | Temp 97.4°F | Ht 65.5 in | Wt 166.2 lb

## 2024-10-27 DIAGNOSIS — G620 Drug-induced polyneuropathy: Secondary | ICD-10-CM

## 2024-10-27 DIAGNOSIS — Z170 Estrogen receptor positive status [ER+]: Secondary | ICD-10-CM | POA: Diagnosis not present

## 2024-10-27 DIAGNOSIS — I773 Arterial fibromuscular dysplasia: Secondary | ICD-10-CM | POA: Diagnosis not present

## 2024-10-27 DIAGNOSIS — C50211 Malignant neoplasm of upper-inner quadrant of right female breast: Secondary | ICD-10-CM | POA: Diagnosis not present

## 2024-10-27 DIAGNOSIS — R053 Chronic cough: Secondary | ICD-10-CM | POA: Diagnosis not present

## 2024-10-27 DIAGNOSIS — R030 Elevated blood-pressure reading, without diagnosis of hypertension: Secondary | ICD-10-CM

## 2024-10-27 DIAGNOSIS — T451X5A Adverse effect of antineoplastic and immunosuppressive drugs, initial encounter: Secondary | ICD-10-CM | POA: Diagnosis not present

## 2024-10-27 NOTE — Patient Instructions (Signed)
 Welcome to [facility]! It was a pleasure meeting you today.  As discussed, Please schedule a 3 month follow up visit today.  Exercise, low salt, Mediterranean diet  PLEASE NOTE:  If you had any LAB tests please let us  know if you have not heard back within a few days. You may see your results on MyChart before we have a chance to review them but we will give you a call once they are reviewed by us . If we ordered any REFERRALS today, please let us  know if you have not heard from their office within the next week.  Let us  know through MyChart if you are needing REFILLS, or have your pharmacy send us  the request. You can also use MyChart to communicate with me or any office staff.  Please try these tips to maintain a healthy lifestyle:  Eat most of your calories during the day when you are active. Eliminate processed foods including packaged sweets (pies, cakes, cookies), reduce intake of potatoes, white bread, white pasta, and white rice. Look for whole grain options, oat flour or almond flour.  Each meal should contain half fruits/vegetables, one quarter protein, and one quarter carbs (no bigger than a computer mouse).  Cut down on sweet beverages. This includes juice, soda, and sweet tea. Also watch fruit intake, though this is a healthier sweet option, it still contains natural sugar! Limit to 3 servings daily.  Drink at least 1 glass of water with each meal and aim for at least 8 glasses per day  Exercise at least 150 minutes every week.

## 2024-10-27 NOTE — Progress Notes (Unsigned)
 "  New Patient Office Visit  Subjective:  Patient ID: Hannah Woodard, female    DOB: January 22, 1971  Age: 54 y.o. MRN: [id number]  CC:  Chief Complaint  Patient presents with   Establish Care    Pt is here to establish care    Discussed the use of AI scribe software for clinical note transcription with the patient, who gave verbal consent to proceed.  History of Present Illness Hannah Woodard is a 54 year old female who presents for management of elevated blood pressure.  She has experienced elevated blood pressure for the past one to two years, with home readings showing systolic values in the 120s and diastolic values in the 80s. She is concerned about her blood pressure due to a family history of strokes and is attempting to manage it through dietary changes, reducing sodium intake, and increasing whole foods consumption.  She has a history of breast cancer diagnosed in November 2018, with surgery in January 2019. It was stage one, triple positive, and located in the right breast on the muscle wall, detected by mammogram. She underwent seven rounds of chemotherapy and thirty sessions of radiation. She opted for menopause induction to avoid tamoxifen due to a family history of strokes and uterine cancer. She receives a shot every three months and takes anastrozole , which she will complete in June. She experiences menopause symptoms such as hot flashes and vaginal dryness, and has undergone pelvic floor therapy.  She has fibromuscular dysplasia. An ultrasound and other scans were performed to evaluate her carotids; the patient recalls being told there was beading, at least on the right side. She has a family history of strokes and aneurysms. She was prescribed propranolol but is hesitant to take it due to potential side effects and prefers natural management of her blood pressure.  She experiences neuropathy, initially managed with gabapentin , which she has since weaned off due to  side effects. She has tried capsaicin  and other therapies with limited success. She also experiences neck pain and has been receiving light shots for relief.  She has a persistent cough, which she attributes to anastrozole . She takes montelukast for cough management. She is cautious about potential interactions between montelukast and aspirin.  She reports occasional chest pain related to scar tissue from breast reduction surgery. She experiences burning during urination, which she attributes to dehydration and uses topical treatments for relief. She is not diabetic but has been told she may have reactive hypoglycemia.  She is married, has no children, and works in the school system. She is trying to maintain an active lifestyle, walking her dog regularly, and is interested in improving her diet and exercise routine.    Current Medications[1]  Past Medical History:  Diagnosis Date   Breast cancer (HCC)    R-triple + chemo and rad. 2018   Family history of ovarian cancer 09/02/2017   Family history of prostate cancer in father 09/02/2017   Family history of uterine cancer 09/02/2017   Fibromuscular dysplasia    Plantar fasciitis     Past Surgical History:  Procedure Laterality Date   PORT-A-CATH REMOVAL Left 2020   right lumpectomy, sentinel node biopsy, and bilateral mammoplasty  10/2017   [facility]    Family History  Problem Relation Age of Onset   Ovarian cancer Mother 18   Prostate cancer Father 13       'high gleason' unsure number   Ulcerative colitis Sister    Other Sister  abn ovaian growth, partial hysterectomy   Basal cell carcinoma Sister 42   Basal cell carcinoma Brother    Uterine cancer Maternal Aunt 55   Stroke Maternal Aunt 66   Skin cancer Paternal Uncle    Stroke Maternal Grandmother 20 - 69   Skin cancer Maternal Grandmother    Stroke Paternal Grandfather 31   Hydrocephalus Cousin     Social History   Socioeconomic History   Marital status:  Married    Spouse name: Not on file   Number of children: 0   Years of education: Not on file   Highest education level: Bachelor's degree (e.g., BA, AB, BS)  Occupational History   Occupation: transport planner  Tobacco Use   Smoking status: Never   Smokeless tobacco: Never  Vaping Use   Vaping status: Never Used  Substance and Sexual Activity   Alcohol use: Not Currently    Comment: rarely    Drug use: No   Sexual activity: Yes    Partners: Male    Birth control/protection: Other-see comments    Comment: chemical menopaus  Other Topics Concern   Not on file  Social History Narrative   Not on file   Social Drivers of Health   Tobacco Use: Low Risk (10/27/2024)   Patient History    Smoking Tobacco Use: Never    Smokeless Tobacco Use: Never    Passive Exposure: Not on file  Financial Resource Strain: Medium Risk (10/26/2024)   Overall Financial Resource Strain (CARDIA)    Difficulty of Paying Living Expenses: Somewhat hard  Food Insecurity: No Food Insecurity (10/26/2024)   Epic    Worried About Radiation Protection Practitioner of Food in the Last Year: Never true    Ran Out of Food in the Last Year: Never true  Transportation Needs: No Transportation Needs (10/26/2024)   Epic    Lack of Transportation (Medical): No    Lack of Transportation (Non-Medical): No  Physical Activity: Sufficiently Active (10/26/2024)   Exercise Vital Sign    Days of Exercise per Week: 3 days    Minutes of Exercise per Session: 60 min  Stress: Stress Concern Present (10/26/2024)   [employer] of Occupational Health - Occupational Stress Questionnaire    Feeling of Stress: To some extent  Social Connections: Socially Integrated (10/26/2024)   Social Connection and Isolation Panel    Frequency of Communication with Friends and Family: Twice a week    Frequency of Social Gatherings with Friends and Family: Twice a week    Attends Religious Services: More than 4 times per year    Active Member of Clubs or  Organizations: Yes    Attends Engineer, Structural: More than 4 times per year    Marital Status: Married  Catering Manager Violence: Not on file  Depression (PHQ2-9): Low Risk (03/24/2024)   Depression (PHQ2-9)    PHQ-2 Score: 0  Alcohol Screen: Low Risk (10/26/2024)   Alcohol Screen    Last Alcohol Screening Score (AUDIT): 1  Housing: Low Risk (10/26/2024)   Epic    Unable to Pay for Housing in the Last Year: No    Number of Times Moved in the Last Year: 0    Homeless in the Last Year: No  Utilities: Not on file  Health Literacy: Not on file    ROS  ROS: Gen: no fever, chills  Skin: no rash, itching ENT: no ear pain, ear drainage, nasal congestion, rhinorrhea, sinus pressure, sore throat Eyes: no blurry vision,  double vision Resp: no wheeze,SOB CV: no , palpitations, LE edema,  occ CP-from scar tissue under R breast GI: no heartburn, n/v/d/c, abd pain GU: no dysuria, urgency, frequency, hematuria FDX:[id number] Neuro: no dizziness, headache, weakness, vertigo.  Can get car sick.    Objective:   Today's Vitals: BP (!) 146/92 (BP Location: Left Arm, Patient Position: Sitting, Cuff Size: Normal)   Pulse 90   Temp (!) 97.4 F (36.3 C) (Temporal)   Ht 5' 5.5 (1.664 m)   Wt 166 lb 4 oz (75.4 kg)   LMP 10/05/2017   SpO2 98%   BMI 27.24 kg/m   Physical Exam  Gen: WDWN NAD HEENT: NCAT, conjunctiva not injected, sclera nonicteric TM WNL B, OP moist, no exudates  NECK:  supple, no thyromegaly, no nodes, no carotid bruits CARDIAC: RRR, S1S2+, no murmur. DP 2+B LUNGS: CTAB. No wheezes ABDOMEN:  BS+, soft, NTND, No HSM, no masses EXT:  no edema MSK: no gross abnormalities.  NEURO: A&O x3.  CN II-XII intact.  PSYCH: normal mood. Good eye contact   Assessment & Plan:  Elevated blood pressure reading -     Amb ref to Medical Nutrition Therapy-MNT  Malignant neoplasm of upper-inner quadrant of right breast in female, estrogen receptor positive  (HCC)    Assessment and Plan Assessment & Plan Elevated blood pressure   Blood pressure has been elevated for 1-2 years with home readings showing systolic around 120s and diastolic not below 80s. No current headaches, dizziness, or visual disturbances. Prefers natural management over medication due to side effects of propranolol. Monitor blood pressure at home, log readings, and note any symptoms. Referred to a nutritionist for dietary guidance. Encouraged low-sodium diet and regular exercise. Follow-up will be scheduled in a few months to reassess blood pressure and review progress.  Fibromuscular dysplasia of carotid arteries   Diagnosed with fibromuscular dysplasia in both carotid arteries. Previous ultrasound showed turbulence in the right carotid. Family history of strokes and aneurysms. Continue annual monitoring of carotid arteries. Continue aspirin 81 mg daily as per previous recommendation.  History of right breast cancer, ER+   Diagnosed in 2018, treated with chemotherapy and radiation. Currently on anastrozole  and receiving injections every three months. Completed seven rounds of chemotherapy and 30 sessions of radiation. Prefers not to extend anastrozole  beyond current plan due to side effects and personal preference. Continue current treatment plan with anastrozole  and injections. Monitor for any new symptoms or changes in health status.  Menopausal symptoms secondary to cancer therapy   Experiencing menopausal symptoms including hot flashes and vaginal dryness due to cancer treatment. Continue current management of menopausal symptoms. Consider pelvic floor therapy if symptoms worsen.  Peripheral neuropathy secondary to chemotherapy   Peripheral neuropathy secondary to chemotherapy. Previously managed with gabapentin , which was discontinued due to side effects. Currently using topical treatments for symptom management. Consider re-evaluation if symptoms worsen.  Chronic cough and  allergic rhinitis   Chronic cough managed with montelukast. Previously experienced severe coughing episodes. No contraindication for concurrent use of montelukast and aspirin. Prefers to minimize medication use if symptoms are controlled. Continue montelukast every other day as needed. Consider daily montelukast if symptoms worsen. Continue aspirin 81 mg daily.  General health maintenance   Discussed dietary habits and exercise. Interested in improving diet and exercise routine to manage blood pressure and overall health. Referred to a nutritionist for dietary guidance. Encouraged regular exercise, including walking and interval training. Will schedule fasting blood work at next visit to assess  cholesterol, kidney function, and liver function.      Follow-up: Return in about 3 months (around 01/25/2025) for chronic follow-up.   Jenkins CHRISTELLA Carrel, MD    [1]  Current Outpatient Medications:    anastrozole  (ARIMIDEX ) 1 MG tablet, TAKE 1 TABLET BY MOUTH EVERY DAY, Disp: 90 tablet, Rfl: 4   ascorbic acid (VITAMIN C) 1000 MG tablet, Take by mouth., Disp: , Rfl:    aspirin EC 81 MG tablet, Take 81 mg by mouth daily. Swallow whole., Disp: , Rfl:    b complex vitamins tablet, Take 1 tablet by mouth daily., Disp: , Rfl:    Cholecalciferol (VITAMIN D3) 5000 units TABS, Take 10,000 Units by mouth daily., Disp: , Rfl:    COLLAGEN PO, Take by mouth daily. Takes beef collagen, Disp: , Rfl:    desoximetasone (TOPICORT) 0.25 % cream, Apply topically daily as needed., Disp: , Rfl:    glucosamine-chondroitin 500-400 MG tablet, Take 3 tablets by mouth daily., Disp: , Rfl:    lactobacillus acidophilus (BACID) TABS tablet, Take 2 tablets by mouth 3 (three) times daily., Disp: , Rfl:    Magnesium Glycinate 100 MG CAPS, Take 200 mg by mouth 2 (two) times daily., Disp: , Rfl:    meloxicam  (MOBIC ) 15 MG tablet, TAKE 1 TABLET (15 MG TOTAL) BY MOUTH DAILY., Disp: 30 tablet, Rfl: 0   Menaquinone-7 (VITAMIN K2 PO), Take 150  mcg by mouth daily., Disp: , Rfl:    montelukast (SINGULAIR) 10 MG tablet, TAKE 1 TABLET (10 MG) BY MOUTH EVERY EVENING, Disp: , Rfl:    senna (SENOKOT) 8.6 MG tablet, Take 1 tablet by mouth at bedtime as needed for constipation., Disp: , Rfl:    Suzetrigine  (JOURNAVX ) 50 MG TABS, Take 1 tablet by mouth 2 (two) times daily as needed., Disp: 30 tablet, Rfl: 0   triamcinolone  ointment (KENALOG ) 0.1 %, Apply 1 application topically 2 (two) times daily., Disp: 80 g, Rfl: 1   TURMERIC PO, Take 1 capsule by mouth as directed. , Disp: , Rfl:    UNABLE TO FIND, Take 500 mg by mouth daily. Turkey Tail mushroom, Disp: , Rfl:    UNABLE TO FIND, Take 500 mg by mouth daily. [place] Mushroom, Disp: , Rfl:   Current Facility-Administered Medications:    sodium chloride  (PF) 0.9 % injection 2 mL, 2 mL, Intravenous, PRN, Raulkar, Krutika P, MD  "

## 2024-10-28 DIAGNOSIS — R053 Chronic cough: Secondary | ICD-10-CM | POA: Insufficient documentation

## 2024-10-28 DIAGNOSIS — I773 Arterial fibromuscular dysplasia: Secondary | ICD-10-CM | POA: Insufficient documentation

## 2024-10-31 ENCOUNTER — Encounter: Attending: Physical Medicine and Rehabilitation | Admitting: Physical Medicine and Rehabilitation

## 2024-10-31 VITALS — BP 142/97 | HR 83 | Ht 65.5 in | Wt 166.0 lb

## 2024-10-31 DIAGNOSIS — F439 Reaction to severe stress, unspecified: Secondary | ICD-10-CM

## 2024-10-31 DIAGNOSIS — E785 Hyperlipidemia, unspecified: Secondary | ICD-10-CM

## 2024-10-31 DIAGNOSIS — M7918 Myalgia, other site: Secondary | ICD-10-CM | POA: Diagnosis not present

## 2024-10-31 DIAGNOSIS — N898 Other specified noninflammatory disorders of vagina: Secondary | ICD-10-CM

## 2024-10-31 DIAGNOSIS — I10 Essential (primary) hypertension: Secondary | ICD-10-CM | POA: Diagnosis not present

## 2024-10-31 DIAGNOSIS — K5900 Constipation, unspecified: Secondary | ICD-10-CM

## 2024-10-31 MED ORDER — LIDOCAINE HCL 1 % IJ SOLN
5.0000 mL | Freq: Once | INTRAMUSCULAR | Status: AC
  Administered 2024-10-31: 5 mL via INTRADERMAL

## 2024-10-31 NOTE — Patient Instructions (Signed)
 HTN: -Advised checking BP daily at home and logging results to bring into follow-up appointment with PCP and myself. -Reviewed BP meds today.  -Advised regarding healthy foods that can help lower blood pressure and provided with a list: 1) citrus foods- high in vitamins and minerals 2) salmon and other fatty fish - reduces inflammation and oxylipins 3) swiss chard (leafy green)- high level of nitrates 4) pumpkin seeds- one of the best natural sources of magnesium  5) Beans and lentils- high in fiber, magnesium , and potassium 6) Berries- high in flavonoids 7) Amaranth (whole grain, can be cooked similarly to rice and oats)- high in magnesium  and fiber 8) Pistachios- even more effective at reducing BP than other nuts 9) Carrots- high in phenolic compounds that relax blood vessels and reduce inflammation 10) Celery- contain phthalides that relax tissues of arterial walls 11) Tomatoes- can also improve cholesterol and reduce risk of heart disease 12) Broccoli- good source of magnesium , calcium , and potassium 13) Greek yogurt: high in potassium and calcium  14) Herbs and spices: Celery seed, cilantro, saffron, lemongrass, black cumin, ginseng, cinnamon, cardamom, sweet basil, and ginger 15) Chia and flax seeds- also help to lower cholesterol and blood sugar 16) Beets- high levels of nitrates that relax blood vessels  17) spinach and bananas- high in potassium  -Provided lise of supplements that can help with hypertension:  1) magnesium : one high quality brand is Bioptemizers since it contains all 7 types of magnesium , otherwise over the counter magnesium  gluconate 400mg  is a good option 2) B vitamins 3) vitamin D 4) potassium 5) CoQ10 6) L-arginine 7) Vitamin C 8) Beetroot -Educated that goal BP is 120/80. -Made goal to incorporate some of the above foods into diet.    Foods that may reduce pain: 1) Ginger (especially studied for arthritis)- reduce leukotriene production to decrease  inflammation 2) Blueberries- high in phytonutrients that decrease inflammation 3) Salmon- marine omega-3s reduce joint swelling and pain 4) Pumpkin seeds- reduce inflammation 5) dark chocolate- reduces inflammation 6) turmeric- reduces inflammation 7) tart cherries - reduce pain and stiffness 8) extra virgin olive oil - its compound olecanthal helps to block prostaglandins  9) chili peppers- can be eaten or applied topically via capsaicin 10) mint- helpful for headache, muscle aches, joint pain, and itching 11) garlic- reduces inflammation 12) Green tea- reduces inflammation and oxidative stress, helps with weight loss, may reduce the risk of cancer, recommend Double Green Matcha [place] of Tea daily  Link to further information on diet for chronic pain: [email or web address]

## 2024-10-31 NOTE — Progress Notes (Signed)

## 2024-11-16 ENCOUNTER — Encounter: Admitting: Dietician

## 2024-11-28 ENCOUNTER — Inpatient Hospital Stay

## 2025-01-09 ENCOUNTER — Encounter: Admitting: Physical Medicine and Rehabilitation

## 2025-01-22 ENCOUNTER — Ambulatory Visit: Admitting: Family Medicine

## 2025-01-23 ENCOUNTER — Ambulatory Visit: Admitting: Family Medicine

## 2025-02-20 ENCOUNTER — Inpatient Hospital Stay

## 2025-04-11 ENCOUNTER — Other Ambulatory Visit (HOSPITAL_COMMUNITY)

## 2025-04-11 ENCOUNTER — Other Ambulatory Visit

## 2025-04-17 ENCOUNTER — Ambulatory Visit: Admitting: Gynecologic Oncology

## 2025-07-18 ENCOUNTER — Inpatient Hospital Stay: Admitting: Hematology and Oncology
